# Patient Record
Sex: Female | Born: 1940 | Race: White | Hispanic: No | Smoking: Never smoker
Health system: Southern US, Community
[De-identification: ages and names within clinical notes are randomized; demographics above are authoritative.]

## PROBLEM LIST (undated history)

## (undated) DIAGNOSIS — E079 Disorder of thyroid, unspecified: Secondary | ICD-10-CM

## (undated) DIAGNOSIS — C50912 Malignant neoplasm of unspecified site of left female breast: Secondary | ICD-10-CM

## (undated) DIAGNOSIS — E039 Hypothyroidism, unspecified: Secondary | ICD-10-CM

## (undated) DIAGNOSIS — K219 Gastro-esophageal reflux disease without esophagitis: Secondary | ICD-10-CM

## (undated) DIAGNOSIS — I509 Heart failure, unspecified: Secondary | ICD-10-CM

## (undated) DIAGNOSIS — I82409 Acute embolism and thrombosis of unspecified deep veins of unspecified lower extremity: Secondary | ICD-10-CM

## (undated) DIAGNOSIS — I10 Essential (primary) hypertension: Secondary | ICD-10-CM

## (undated) DIAGNOSIS — H9190 Unspecified hearing loss, unspecified ear: Secondary | ICD-10-CM

## (undated) DIAGNOSIS — Z8719 Personal history of other diseases of the digestive system: Secondary | ICD-10-CM

## (undated) DIAGNOSIS — R42 Dizziness and giddiness: Secondary | ICD-10-CM

## (undated) DIAGNOSIS — R0600 Dyspnea, unspecified: Secondary | ICD-10-CM

## (undated) DIAGNOSIS — I4891 Unspecified atrial fibrillation: Secondary | ICD-10-CM

## (undated) DIAGNOSIS — Z79811 Long term (current) use of aromatase inhibitors: Secondary | ICD-10-CM

## (undated) DIAGNOSIS — K6389 Other specified diseases of intestine: Secondary | ICD-10-CM

## (undated) DIAGNOSIS — C50919 Malignant neoplasm of unspecified site of unspecified female breast: Secondary | ICD-10-CM

## (undated) DIAGNOSIS — Z923 Personal history of irradiation: Secondary | ICD-10-CM

## (undated) DIAGNOSIS — R52 Pain, unspecified: Secondary | ICD-10-CM

## (undated) DIAGNOSIS — N189 Chronic kidney disease, unspecified: Secondary | ICD-10-CM

## (undated) DIAGNOSIS — Z87442 Personal history of urinary calculi: Secondary | ICD-10-CM

## (undated) HISTORY — DX: Unspecified hearing loss, unspecified ear: H91.90

## (undated) HISTORY — PX: CHOLECYSTECTOMY: SHX55

## (undated) HISTORY — DX: Unspecified atrial fibrillation: I48.91

## (undated) HISTORY — PX: ABDOMINAL HYSTERECTOMY: SHX81

## (undated) HISTORY — DX: Other specified diseases of intestine: K63.89

## (undated) HISTORY — DX: Malignant neoplasm of unspecified site of left female breast: C50.912

## (undated) HISTORY — DX: Long term (current) use of aromatase inhibitors: Z79.811

---

## 2004-05-14 ENCOUNTER — Ambulatory Visit: Payer: Self-pay | Admitting: Internal Medicine

## 2005-06-11 ENCOUNTER — Ambulatory Visit: Payer: Self-pay | Admitting: Internal Medicine

## 2005-10-15 ENCOUNTER — Emergency Department: Payer: Self-pay | Admitting: Emergency Medicine

## 2006-06-25 ENCOUNTER — Ambulatory Visit: Payer: Self-pay | Admitting: Internal Medicine

## 2007-06-28 ENCOUNTER — Ambulatory Visit: Payer: Self-pay | Admitting: Internal Medicine

## 2007-07-06 ENCOUNTER — Ambulatory Visit: Payer: Self-pay | Admitting: Internal Medicine

## 2008-04-13 ENCOUNTER — Ambulatory Visit: Payer: Self-pay | Admitting: Internal Medicine

## 2008-07-30 ENCOUNTER — Emergency Department: Payer: Self-pay | Admitting: Emergency Medicine

## 2008-11-03 ENCOUNTER — Ambulatory Visit: Payer: Self-pay | Admitting: Internal Medicine

## 2009-11-14 ENCOUNTER — Ambulatory Visit: Payer: Self-pay

## 2009-11-28 ENCOUNTER — Ambulatory Visit: Payer: Self-pay | Admitting: Internal Medicine

## 2010-06-23 ENCOUNTER — Emergency Department: Payer: Self-pay | Admitting: Emergency Medicine

## 2010-06-28 ENCOUNTER — Ambulatory Visit: Payer: Self-pay | Admitting: Internal Medicine

## 2010-09-24 ENCOUNTER — Ambulatory Visit: Payer: Self-pay | Admitting: Unknown Physician Specialty

## 2010-09-26 LAB — PATHOLOGY REPORT

## 2011-06-03 ENCOUNTER — Ambulatory Visit: Payer: Self-pay | Admitting: Internal Medicine

## 2011-06-17 ENCOUNTER — Ambulatory Visit: Payer: Self-pay | Admitting: Internal Medicine

## 2011-06-17 LAB — CBC CANCER CENTER
Basophil #: 0.1 x10 3/mm (ref 0.0–0.1)
Eosinophil %: 5.1 %
Eosinophil: 6 %
HGB: 9.2 g/dL — ABNORMAL LOW (ref 12.0–16.0)
Lymphocyte %: 16.8 %
Lymphocytes: 15 %
MCH: 20.8 pg — ABNORMAL LOW (ref 26.0–34.0)
MCHC: 30.2 g/dL — ABNORMAL LOW (ref 32.0–36.0)
Monocyte #: 0.7 x10 3/mm (ref 0.2–0.9)
Neutrophil #: 3.7 x10 3/mm (ref 1.4–6.5)
Neutrophil %: 65 %
RBC: 4.45 10*6/uL (ref 3.80–5.20)
RDW: 19.4 % — ABNORMAL HIGH (ref 11.5–14.5)
Segmented Neutrophils: 70 %
WBC: 5.7 x10 3/mm (ref 3.6–11.0)

## 2011-06-17 LAB — IRON AND TIBC
Iron Bind.Cap.(Total): 515 ug/dL — ABNORMAL HIGH (ref 250–450)
Iron: 25 ug/dL — ABNORMAL LOW (ref 50–170)

## 2011-06-17 LAB — FERRITIN: Ferritin (ARMC): 5 ng/mL — ABNORMAL LOW (ref 8–388)

## 2011-06-17 LAB — CREATININE, SERUM
Creatinine: 0.7 mg/dL (ref 0.60–1.30)
EGFR (African American): >60

## 2011-06-17 LAB — RETICULOCYTES: Reticulocyte: 1.34 % (ref 0.5–1.5)

## 2011-06-18 LAB — PROT IMMUNOELECTROPHORES(ARMC)

## 2011-06-18 LAB — URINE IEP, RANDOM

## 2011-06-28 ENCOUNTER — Ambulatory Visit: Payer: Self-pay | Admitting: Internal Medicine

## 2011-07-28 ENCOUNTER — Ambulatory Visit: Payer: Self-pay | Admitting: Internal Medicine

## 2011-09-17 ENCOUNTER — Ambulatory Visit: Payer: Self-pay | Admitting: Internal Medicine

## 2011-09-17 LAB — CBC CANCER CENTER
Basophil %: 1 %
Eosinophil #: 0.4 x10 3/mm (ref 0.0–0.7)
Eosinophil %: 5.2 %
HCT: 39.6 % (ref 35.0–47.0)
HGB: 12.9 g/dL (ref 12.0–16.0)
Lymphocyte #: 1.1 x10 3/mm (ref 1.0–3.6)
MCV: 83 fL (ref 80–100)
Monocyte %: 11 %
Neutrophil #: 4.6 x10 3/mm (ref 1.4–6.5)
RBC: 4.8 10*6/uL (ref 3.80–5.20)
WBC: 6.9 x10 3/mm (ref 3.6–11.0)

## 2011-09-17 LAB — IRON AND TIBC
Iron: 112 ug/dL (ref 50–170)
Unbound Iron-Bind.Cap.: 312 ug/dL

## 2011-09-28 ENCOUNTER — Ambulatory Visit: Payer: Self-pay | Admitting: Internal Medicine

## 2011-12-10 ENCOUNTER — Ambulatory Visit: Payer: Self-pay | Admitting: Internal Medicine

## 2011-12-10 LAB — CANCER CENTER HEMOGLOBIN: HGB: 13 g/dL (ref 12.0–16.0)

## 2011-12-10 LAB — IRON AND TIBC: Iron Bind.Cap.(Total): 460 ug/dL — ABNORMAL HIGH (ref 250–450)

## 2011-12-10 LAB — FERRITIN: Ferritin (ARMC): 12 ng/mL (ref 8–388)

## 2011-12-28 ENCOUNTER — Ambulatory Visit: Payer: Self-pay | Admitting: Internal Medicine

## 2012-01-28 DIAGNOSIS — C50919 Malignant neoplasm of unspecified site of unspecified female breast: Secondary | ICD-10-CM

## 2012-01-28 HISTORY — PX: BREAST LUMPECTOMY WITH SENTINEL LYMPH NODE BIOPSY: SHX5597

## 2012-01-28 HISTORY — DX: Malignant neoplasm of unspecified site of unspecified female breast: C50.919

## 2012-02-12 ENCOUNTER — Emergency Department: Payer: Self-pay | Admitting: Emergency Medicine

## 2012-02-12 LAB — COMPREHENSIVE METABOLIC PANEL
Albumin: 4.2 g/dL (ref 3.4–5.0)
Bilirubin,Total: 0.6 mg/dL (ref 0.2–1.0)
Calcium, Total: 9.7 mg/dL (ref 8.5–10.1)
Chloride: 103 mmol/L (ref 98–107)
Co2: 30 mmol/L (ref 21–32)
Creatinine: 0.69 mg/dL (ref 0.60–1.30)
Glucose: 97 mg/dL (ref 65–99)
Osmolality: 280 (ref 275–301)
SGOT(AST): 27 U/L (ref 15–37)
SGPT (ALT): 24 U/L (ref 12–78)
Total Protein: 7.6 g/dL (ref 6.4–8.2)

## 2012-02-12 LAB — CBC
HCT: 40.6 % (ref 35.0–47.0)
HGB: 13.8 g/dL (ref 12.0–16.0)
MCH: 29.1 pg (ref 26.0–34.0)
Platelet: 190 10*3/uL (ref 150–440)
RDW: 13.9 % (ref 11.5–14.5)

## 2012-03-01 ENCOUNTER — Ambulatory Visit: Payer: Self-pay | Admitting: Unknown Physician Specialty

## 2012-03-02 ENCOUNTER — Ambulatory Visit: Payer: Self-pay | Admitting: Internal Medicine

## 2012-03-03 LAB — PATHOLOGY REPORT

## 2012-03-03 LAB — IRON AND TIBC
Iron Bind.Cap.(Total): 450 ug/dL (ref 250–450)
Iron Saturation: 8 %
Unbound Iron-Bind.Cap.: 412 ug/dL

## 2012-03-03 LAB — CANCER CENTER HEMOGLOBIN: HGB: 13 g/dL (ref 12.0–16.0)

## 2012-03-27 ENCOUNTER — Ambulatory Visit: Payer: Self-pay | Admitting: Internal Medicine

## 2012-04-27 ENCOUNTER — Ambulatory Visit: Payer: Self-pay | Admitting: Internal Medicine

## 2012-05-27 ENCOUNTER — Ambulatory Visit: Payer: Self-pay | Admitting: Internal Medicine

## 2012-06-04 ENCOUNTER — Ambulatory Visit: Payer: Self-pay | Admitting: Internal Medicine

## 2012-06-07 ENCOUNTER — Ambulatory Visit: Payer: Self-pay | Admitting: Internal Medicine

## 2012-06-07 LAB — CBC CANCER CENTER
Basophil #: 0 x10 3/mm (ref 0.0–0.1)
Eosinophil #: 0.4 x10 3/mm (ref 0.0–0.7)
Eosinophil %: 6 %
HGB: 13.8 g/dL (ref 12.0–16.0)
Lymphocyte #: 1 x10 3/mm (ref 1.0–3.6)
Lymphocyte %: 13.7 %
MCH: 29.9 pg (ref 26.0–34.0)
Monocyte #: 0.9 x10 3/mm (ref 0.2–0.9)
Platelet: 165 x10 3/mm (ref 150–440)
RBC: 4.6 10*6/uL (ref 3.80–5.20)
WBC: 7.3 x10 3/mm (ref 3.6–11.0)

## 2012-06-07 LAB — IRON AND TIBC
Iron Bind.Cap.(Total): 332 ug/dL (ref 250–450)
Iron Saturation: 27 %

## 2012-06-07 LAB — FERRITIN: Ferritin (ARMC): 94 ng/mL (ref 8–388)

## 2012-06-14 ENCOUNTER — Ambulatory Visit: Payer: Self-pay | Admitting: Internal Medicine

## 2012-06-27 ENCOUNTER — Ambulatory Visit: Payer: Self-pay | Admitting: Internal Medicine

## 2012-09-17 ENCOUNTER — Ambulatory Visit: Payer: Self-pay | Admitting: Orthopedic Surgery

## 2012-10-04 ENCOUNTER — Ambulatory Visit: Payer: Self-pay | Admitting: Orthopedic Surgery

## 2012-10-04 LAB — URINALYSIS, COMPLETE
Bacteria: NONE SEEN
Blood: NEGATIVE
Ph: 5 (ref 4.5–8.0)
RBC,UR: 1 /HPF (ref 0–5)
Specific Gravity: 1.025 (ref 1.003–1.030)
Squamous Epithelial: 5

## 2012-10-04 LAB — CBC
HCT: 39.5 % (ref 35.0–47.0)
HGB: 13.8 g/dL (ref 12.0–16.0)
MCH: 31.1 pg (ref 26.0–34.0)
MCHC: 34.9 g/dL (ref 32.0–36.0)
WBC: 7.1 10*3/uL (ref 3.6–11.0)

## 2012-10-04 LAB — PROTIME-INR
INR: 1
Prothrombin Time: 13.3 secs (ref 11.5–14.7)

## 2012-10-04 LAB — BASIC METABOLIC PANEL
Calcium, Total: 9.5 mg/dL (ref 8.5–10.1)
Chloride: 104 mmol/L (ref 98–107)
Creatinine: 0.64 mg/dL (ref 0.60–1.30)
Glucose: 93 mg/dL (ref 65–99)
Osmolality: 277 (ref 275–301)

## 2012-10-04 LAB — APTT: Activated PTT: 28.8 secs (ref 23.6–35.9)

## 2012-10-04 LAB — MRSA PCR SCREENING

## 2012-10-08 ENCOUNTER — Ambulatory Visit: Payer: Self-pay | Admitting: Internal Medicine

## 2012-10-08 LAB — CBC CANCER CENTER
Basophil #: 0.1 x10 3/mm (ref 0.0–0.1)
Basophil %: 0.7 %
Eosinophil %: 4.4 %
Lymphocyte #: 0.8 x10 3/mm — ABNORMAL LOW (ref 1.0–3.6)
Lymphocyte %: 10.9 %
MCV: 91 fL (ref 80–100)
Monocyte %: 11.6 %
Platelet: 168 x10 3/mm (ref 150–440)
RDW: 13.9 % (ref 11.5–14.5)

## 2012-10-08 LAB — FERRITIN: Ferritin (ARMC): 63 ng/mL (ref 8–388)

## 2012-10-08 LAB — IRON AND TIBC
Iron Bind.Cap.(Total): 348 ug/dL (ref 250–450)
Iron Saturation: 26 %
Unbound Iron-Bind.Cap.: 256 ug/dL

## 2012-10-27 ENCOUNTER — Ambulatory Visit: Payer: Self-pay | Admitting: Internal Medicine

## 2012-11-10 ENCOUNTER — Ambulatory Visit: Payer: Self-pay | Admitting: Cardiology

## 2012-11-10 LAB — CBC WITH DIFFERENTIAL/PLATELET
Basophil #: 0.1 10*3/uL (ref 0.0–0.1)
Basophil %: 1.2 %
Eosinophil #: 0.3 10*3/uL (ref 0.0–0.7)
Eosinophil %: 4.7 %
HCT: 39.9 % (ref 35.0–47.0)
HGB: 13.8 g/dL (ref 12.0–16.0)
Lymphocyte #: 1 10*3/uL (ref 1.0–3.6)
Lymphocyte %: 14.8 %
MCH: 30.7 pg (ref 26.0–34.0)
MCHC: 34.6 g/dL (ref 32.0–36.0)
MCV: 89 fL (ref 80–100)
Monocyte %: 13.1 %
Platelet: 165 10*3/uL (ref 150–440)
RBC: 4.49 10*6/uL (ref 3.80–5.20)
RDW: 13.5 % (ref 11.5–14.5)
WBC: 6.6 10*3/uL (ref 3.6–11.0)

## 2012-12-16 ENCOUNTER — Ambulatory Visit: Payer: Self-pay | Admitting: Surgery

## 2012-12-29 ENCOUNTER — Ambulatory Visit: Payer: Self-pay | Admitting: Surgery

## 2013-01-06 ENCOUNTER — Ambulatory Visit: Payer: Self-pay | Admitting: Surgery

## 2013-01-06 LAB — PATHOLOGY REPORT

## 2013-01-13 ENCOUNTER — Ambulatory Visit: Payer: Self-pay | Admitting: Surgery

## 2013-01-13 HISTORY — PX: BREAST LUMPECTOMY: SHX2

## 2013-01-13 LAB — POTASSIUM: Potassium: 3.3 mmol/L — ABNORMAL LOW (ref 3.5–5.1)

## 2013-01-17 LAB — PATHOLOGY REPORT

## 2013-01-27 DIAGNOSIS — Z923 Personal history of irradiation: Secondary | ICD-10-CM

## 2013-01-27 HISTORY — DX: Personal history of irradiation: Z92.3

## 2013-01-31 ENCOUNTER — Ambulatory Visit: Payer: Self-pay | Admitting: Radiation Oncology

## 2013-02-07 LAB — IRON AND TIBC
IRON SATURATION: 30 %
IRON: 108 ug/dL (ref 50–170)
Iron Bind.Cap.(Total): 355 ug/dL (ref 250–450)
Unbound Iron-Bind.Cap.: 247 ug/dL

## 2013-02-07 LAB — CBC CANCER CENTER
BASOS ABS: 0 x10 3/mm (ref 0.0–0.1)
BASOS PCT: 0.5 %
EOS ABS: 0.3 x10 3/mm (ref 0.0–0.7)
Eosinophil %: 5.3 %
HCT: 41.5 % (ref 35.0–47.0)
HGB: 13.5 g/dL (ref 12.0–16.0)
LYMPHS ABS: 0.8 x10 3/mm — AB (ref 1.0–3.6)
Lymphocyte %: 12.7 %
MCH: 28.9 pg (ref 26.0–34.0)
MCHC: 32.4 g/dL (ref 32.0–36.0)
MCV: 89 fL (ref 80–100)
Monocyte #: 0.8 x10 3/mm (ref 0.2–0.9)
Monocyte %: 12.6 %
NEUTROS PCT: 68.9 %
Neutrophil #: 4.5 x10 3/mm (ref 1.4–6.5)
PLATELETS: 180 x10 3/mm (ref 150–440)
RBC: 4.65 10*6/uL (ref 3.80–5.20)
RDW: 14.1 % (ref 11.5–14.5)
WBC: 6.6 x10 3/mm (ref 3.6–11.0)

## 2013-02-07 LAB — FERRITIN: FERRITIN (ARMC): 52 ng/mL (ref 8–388)

## 2013-02-27 ENCOUNTER — Ambulatory Visit: Payer: Self-pay | Admitting: Radiation Oncology

## 2013-03-03 LAB — CBC CANCER CENTER
Basophil #: 0.1 x10 3/mm (ref 0.0–0.1)
Basophil %: 0.7 %
EOS ABS: 0.4 x10 3/mm (ref 0.0–0.7)
EOS PCT: 5.3 %
HCT: 43 % (ref 35.0–47.0)
HGB: 14.1 g/dL (ref 12.0–16.0)
Lymphocyte #: 0.8 x10 3/mm — ABNORMAL LOW (ref 1.0–3.6)
Lymphocyte %: 11 %
MCH: 29.4 pg (ref 26.0–34.0)
MCHC: 32.8 g/dL (ref 32.0–36.0)
MCV: 90 fL (ref 80–100)
Monocyte #: 1 x10 3/mm — ABNORMAL HIGH (ref 0.2–0.9)
Monocyte %: 13.8 %
NEUTROS PCT: 69.2 %
Neutrophil #: 5.3 x10 3/mm (ref 1.4–6.5)
Platelet: 183 x10 3/mm (ref 150–440)
RBC: 4.8 10*6/uL (ref 3.80–5.20)
RDW: 14.2 % (ref 11.5–14.5)
WBC: 7.6 x10 3/mm (ref 3.6–11.0)

## 2013-03-10 LAB — CBC CANCER CENTER
Basophil #: 0 x10 3/mm (ref 0.0–0.1)
Basophil %: 0.5 %
Eosinophil #: 0.3 x10 3/mm (ref 0.0–0.7)
Eosinophil %: 4.7 %
HCT: 41.8 % (ref 35.0–47.0)
HGB: 13.8 g/dL (ref 12.0–16.0)
LYMPHS PCT: 8.2 %
Lymphocyte #: 0.6 x10 3/mm — ABNORMAL LOW (ref 1.0–3.6)
MCH: 29.3 pg (ref 26.0–34.0)
MCHC: 32.9 g/dL (ref 32.0–36.0)
MCV: 89 fL (ref 80–100)
Monocyte #: 1 x10 3/mm — ABNORMAL HIGH (ref 0.2–0.9)
Monocyte %: 13.7 %
NEUTROS ABS: 5.4 x10 3/mm (ref 1.4–6.5)
Neutrophil %: 72.9 %
Platelet: 175 x10 3/mm (ref 150–440)
RBC: 4.69 10*6/uL (ref 3.80–5.20)
RDW: 14.2 % (ref 11.5–14.5)
WBC: 7.4 x10 3/mm (ref 3.6–11.0)

## 2013-03-27 ENCOUNTER — Ambulatory Visit: Payer: Self-pay | Admitting: Radiation Oncology

## 2013-03-31 LAB — CBC CANCER CENTER
Basophil #: 0.1 x10 3/mm (ref 0.0–0.1)
Basophil %: 1 %
EOS PCT: 5.2 %
Eosinophil #: 0.4 x10 3/mm (ref 0.0–0.7)
HCT: 39.1 % (ref 35.0–47.0)
HGB: 12.7 g/dL (ref 12.0–16.0)
LYMPHS ABS: 0.5 x10 3/mm — AB (ref 1.0–3.6)
LYMPHS PCT: 7.2 %
MCH: 29.4 pg (ref 26.0–34.0)
MCHC: 32.6 g/dL (ref 32.0–36.0)
MCV: 90 fL (ref 80–100)
MONO ABS: 0.8 x10 3/mm (ref 0.2–0.9)
Monocyte %: 12.1 %
Neutrophil #: 5.2 x10 3/mm (ref 1.4–6.5)
Neutrophil %: 74.5 %
PLATELETS: 174 x10 3/mm (ref 150–440)
RBC: 4.33 10*6/uL (ref 3.80–5.20)
RDW: 14 % (ref 11.5–14.5)
WBC: 6.9 x10 3/mm (ref 3.6–11.0)

## 2013-04-07 LAB — CBC CANCER CENTER
BASOS ABS: 0.1 x10 3/mm (ref 0.0–0.1)
Basophil %: 0.9 %
EOS PCT: 5.1 %
Eosinophil #: 0.4 x10 3/mm (ref 0.0–0.7)
HCT: 39.4 % (ref 35.0–47.0)
HGB: 12.8 g/dL (ref 12.0–16.0)
LYMPHS ABS: 0.4 x10 3/mm — AB (ref 1.0–3.6)
Lymphocyte %: 6.1 %
MCH: 29.2 pg (ref 26.0–34.0)
MCHC: 32.6 g/dL (ref 32.0–36.0)
MCV: 90 fL (ref 80–100)
MONOS PCT: 11.6 %
Monocyte #: 0.8 x10 3/mm (ref 0.2–0.9)
Neutrophil #: 5.5 x10 3/mm (ref 1.4–6.5)
Neutrophil %: 76.3 %
Platelet: 173 x10 3/mm (ref 150–440)
RBC: 4.39 10*6/uL (ref 3.80–5.20)
RDW: 14.2 % (ref 11.5–14.5)
WBC: 7.2 x10 3/mm (ref 3.6–11.0)

## 2013-04-21 LAB — CBC CANCER CENTER
BASOS ABS: 0 x10 3/mm (ref 0.0–0.1)
Basophil %: 0.5 %
EOS PCT: 5.3 %
Eosinophil #: 0.3 x10 3/mm (ref 0.0–0.7)
HCT: 39.7 % (ref 35.0–47.0)
HGB: 12.8 g/dL (ref 12.0–16.0)
Lymphocyte #: 0.4 x10 3/mm — ABNORMAL LOW (ref 1.0–3.6)
Lymphocyte %: 6.4 %
MCH: 29 pg (ref 26.0–34.0)
MCHC: 32.3 g/dL (ref 32.0–36.0)
MCV: 90 fL (ref 80–100)
MONOS PCT: 14.6 %
Monocyte #: 0.8 x10 3/mm (ref 0.2–0.9)
NEUTROS ABS: 4.2 x10 3/mm (ref 1.4–6.5)
Neutrophil %: 73.2 %
Platelet: 168 x10 3/mm (ref 150–440)
RBC: 4.43 10*6/uL (ref 3.80–5.20)
RDW: 13.8 % (ref 11.5–14.5)
WBC: 5.7 x10 3/mm (ref 3.6–11.0)

## 2013-04-26 LAB — HEPATIC FUNCTION PANEL A (ARMC)
ALBUMIN: 3.7 g/dL (ref 3.4–5.0)
ALT: 20 U/L (ref 12–78)
AST: 22 U/L (ref 15–37)
Alkaline Phosphatase: 80 U/L
Bilirubin, Direct: 0.1 mg/dL (ref 0.00–0.20)
Bilirubin,Total: 0.7 mg/dL (ref 0.2–1.0)
Total Protein: 6.8 g/dL (ref 6.4–8.2)

## 2013-04-26 LAB — CBC CANCER CENTER
Basophil #: 0 x10 3/mm (ref 0.0–0.1)
Basophil %: 0.8 %
Eosinophil #: 0.3 x10 3/mm (ref 0.0–0.7)
Eosinophil %: 4.6 %
HCT: 39.7 % (ref 35.0–47.0)
HGB: 12.7 g/dL (ref 12.0–16.0)
Lymphocyte #: 0.3 x10 3/mm — ABNORMAL LOW (ref 1.0–3.6)
Lymphocyte %: 5.7 %
MCH: 28.8 pg (ref 26.0–34.0)
MCHC: 32 g/dL (ref 32.0–36.0)
MCV: 90 fL (ref 80–100)
MONO ABS: 0.8 x10 3/mm (ref 0.2–0.9)
MONOS PCT: 13.9 %
Neutrophil #: 4.2 x10 3/mm (ref 1.4–6.5)
Neutrophil %: 75 %
PLATELETS: 164 x10 3/mm (ref 150–440)
RBC: 4.4 10*6/uL (ref 3.80–5.20)
RDW: 14 % (ref 11.5–14.5)
WBC: 5.6 x10 3/mm (ref 3.6–11.0)

## 2013-04-26 LAB — IRON AND TIBC
IRON BIND. CAP.(TOTAL): 408 ug/dL (ref 250–450)
Iron Saturation: 22 %
Iron: 90 ug/dL (ref 50–170)
UNBOUND IRON-BIND. CAP.: 318 ug/dL

## 2013-04-26 LAB — FERRITIN: Ferritin (ARMC): 12 ng/mL (ref 8–388)

## 2013-04-26 LAB — CREATININE, SERUM: Creatinine: 0.75 mg/dL (ref 0.60–1.30)

## 2013-04-27 ENCOUNTER — Ambulatory Visit: Payer: Self-pay | Admitting: Radiation Oncology

## 2013-05-27 ENCOUNTER — Ambulatory Visit: Payer: Self-pay | Admitting: Radiation Oncology

## 2013-06-08 ENCOUNTER — Ambulatory Visit: Payer: Self-pay | Admitting: Surgery

## 2013-07-12 ENCOUNTER — Emergency Department: Payer: Self-pay | Admitting: Emergency Medicine

## 2013-07-12 LAB — COMPREHENSIVE METABOLIC PANEL
ALT: 18 U/L (ref 12–78)
ANION GAP: 10 (ref 7–16)
Albumin: 3.7 g/dL (ref 3.4–5.0)
Alkaline Phosphatase: 100 U/L
BUN: 18 mg/dL (ref 7–18)
Bilirubin,Total: 1 mg/dL (ref 0.2–1.0)
CALCIUM: 9.2 mg/dL (ref 8.5–10.1)
Chloride: 107 mmol/L (ref 98–107)
Co2: 20 mmol/L — ABNORMAL LOW (ref 21–32)
Creatinine: 0.75 mg/dL (ref 0.60–1.30)
EGFR (African American): >60
Glucose: 122 mg/dL — ABNORMAL HIGH (ref 65–99)
Osmolality: 277 (ref 275–301)
POTASSIUM: 4.7 mmol/L (ref 3.5–5.1)
SGOT(AST): 31 U/L (ref 15–37)
Sodium: 137 mmol/L (ref 136–145)
TOTAL PROTEIN: 6.9 g/dL (ref 6.4–8.2)

## 2013-07-12 LAB — URINALYSIS, COMPLETE
Bilirubin,UR: NEGATIVE
Glucose,UR: NEGATIVE mg/dL (ref 0–75)
KETONE: NEGATIVE
Leukocyte Esterase: NEGATIVE
Nitrite: NEGATIVE
PROTEIN: NEGATIVE
Ph: 6 (ref 4.5–8.0)
Specific Gravity: 1.015 (ref 1.003–1.030)
Squamous Epithelial: <1
WBC UR: 4 /HPF (ref 0–5)

## 2013-07-12 LAB — LIPASE, BLOOD: Lipase: 158 U/L (ref 73–393)

## 2013-07-12 LAB — PROTIME-INR
INR: 0.9
Prothrombin Time: 11.9 secs (ref 11.5–14.7)

## 2013-07-12 LAB — CBC
HCT: 32.8 % — ABNORMAL LOW (ref 35.0–47.0)
HGB: 10.5 g/dL — AB (ref 12.0–16.0)
MCH: 26.3 pg (ref 26.0–34.0)
MCHC: 32.1 g/dL (ref 32.0–36.0)
MCV: 82 fL (ref 80–100)
PLATELETS: 185 10*3/uL (ref 150–440)
RBC: 4 10*6/uL (ref 3.80–5.20)
RDW: 14.8 % — ABNORMAL HIGH (ref 11.5–14.5)
WBC: 12.5 10*3/uL — ABNORMAL HIGH (ref 3.6–11.0)

## 2013-08-01 ENCOUNTER — Ambulatory Visit: Payer: Self-pay | Admitting: Surgery

## 2013-08-02 ENCOUNTER — Ambulatory Visit: Payer: Self-pay | Admitting: Internal Medicine

## 2013-08-02 LAB — HEPATIC FUNCTION PANEL A (ARMC)
ALK PHOS: 94 U/L
Albumin: 3.7 g/dL (ref 3.4–5.0)
BILIRUBIN TOTAL: 0.5 mg/dL (ref 0.2–1.0)
Bilirubin, Direct: 0.1 mg/dL (ref 0.00–0.20)
SGOT(AST): 18 U/L (ref 15–37)
SGPT (ALT): 16 U/L (ref 12–78)
Total Protein: 6.8 g/dL (ref 6.4–8.2)

## 2013-08-02 LAB — CBC CANCER CENTER
BASOS ABS: 0 x10 3/mm (ref 0.0–0.1)
BASOS PCT: 0.7 %
EOS ABS: 0.2 x10 3/mm (ref 0.0–0.7)
EOS PCT: 4.3 %
HCT: 30.5 % — ABNORMAL LOW (ref 35.0–47.0)
HGB: 9.4 g/dL — ABNORMAL LOW (ref 12.0–16.0)
Lymphocyte #: 0.6 x10 3/mm — ABNORMAL LOW (ref 1.0–3.6)
Lymphocyte %: 11.2 %
MCH: 24.4 pg — AB (ref 26.0–34.0)
MCHC: 30.9 g/dL — ABNORMAL LOW (ref 32.0–36.0)
MCV: 79 fL — ABNORMAL LOW (ref 80–100)
Monocyte #: 0.7 x10 3/mm (ref 0.2–0.9)
Monocyte %: 13 %
Neutrophil #: 3.9 x10 3/mm (ref 1.4–6.5)
Neutrophil %: 70.8 %
PLATELETS: 194 x10 3/mm (ref 150–440)
RBC: 3.87 10*6/uL (ref 3.80–5.20)
RDW: 15.7 % — AB (ref 11.5–14.5)
WBC: 5.5 x10 3/mm (ref 3.6–11.0)

## 2013-08-02 LAB — CREATININE, SERUM
Creatinine: 0.76 mg/dL (ref 0.60–1.30)
EGFR (African American): >60
EGFR (Non-African Amer.): >60

## 2013-08-02 LAB — PROTIME-INR
INR: 1
Prothrombin Time: 13.5 secs (ref 11.5–14.7)

## 2013-08-02 LAB — APTT: ACTIVATED PTT: 28.5 s (ref 23.6–35.9)

## 2013-08-03 LAB — CEA: CEA: 1.3 ng/mL (ref 0.0–4.7)

## 2013-08-03 LAB — CANCER ANTIGEN 19-9: CA 19-9: 7 U/mL (ref 0–35)

## 2013-08-03 LAB — CA 125: CA 125: 12.7 U/mL (ref 0.0–34.0)

## 2013-08-03 LAB — CANCER ANTIGEN 27.29: CA 27.29: 11.3 U/mL (ref 0.0–38.6)

## 2013-08-08 ENCOUNTER — Ambulatory Visit: Payer: Self-pay | Admitting: Internal Medicine

## 2013-08-27 ENCOUNTER — Ambulatory Visit: Payer: Self-pay | Admitting: Internal Medicine

## 2013-11-17 ENCOUNTER — Ambulatory Visit: Payer: Self-pay | Admitting: Internal Medicine

## 2013-11-27 ENCOUNTER — Ambulatory Visit: Payer: Self-pay | Admitting: Internal Medicine

## 2014-01-31 ENCOUNTER — Ambulatory Visit: Payer: Self-pay | Admitting: Internal Medicine

## 2014-01-31 LAB — CBC CANCER CENTER
BASOS PCT: 1.6 %
Basophil #: 0.1 x10 3/mm (ref 0.0–0.1)
EOS ABS: 0.2 x10 3/mm (ref 0.0–0.7)
Eosinophil %: 4.9 %
HCT: 23.9 % — ABNORMAL LOW (ref 35.0–47.0)
HGB: 7.1 g/dL — AB (ref 12.0–16.0)
LYMPHS PCT: 15.5 %
Lymphocyte #: 0.7 x10 3/mm — ABNORMAL LOW (ref 1.0–3.6)
MCH: 18.4 pg — ABNORMAL LOW (ref 26.0–34.0)
MCHC: 29.7 g/dL — AB (ref 32.0–36.0)
MCV: 62 fL — ABNORMAL LOW (ref 80–100)
MONO ABS: 0.7 x10 3/mm (ref 0.2–0.9)
Monocyte %: 15.4 %
NEUTROS ABS: 3 x10 3/mm (ref 1.4–6.5)
NEUTROS PCT: 62.6 %
Platelet: 233 x10 3/mm (ref 150–440)
RBC: 3.87 10*6/uL (ref 3.80–5.20)
RDW: 19.6 % — AB (ref 11.5–14.5)
WBC: 4.7 x10 3/mm (ref 3.6–11.0)

## 2014-01-31 LAB — CREATININE, SERUM
CREATININE: 0.77 mg/dL (ref 0.60–1.30)
EGFR (African American): >60
EGFR (Non-African Amer.): >60

## 2014-01-31 LAB — IRON AND TIBC
IRON SATURATION: 2 %
Iron Bind.Cap.(Total): 541 ug/dL — ABNORMAL HIGH (ref 250–450)
Iron: 13 ug/dL — ABNORMAL LOW (ref 50–170)
Unbound Iron-Bind.Cap.: 528 ug/dL

## 2014-01-31 LAB — HEPATIC FUNCTION PANEL A (ARMC)
AST: 15 U/L (ref 15–37)
Albumin: 3.7 g/dL (ref 3.4–5.0)
Alkaline Phosphatase: 105 U/L
BILIRUBIN DIRECT: 0.2 mg/dL (ref 0.0–0.2)
Bilirubin,Total: 0.7 mg/dL (ref 0.2–1.0)
SGPT (ALT): 16 U/L
Total Protein: 6.7 g/dL (ref 6.4–8.2)

## 2014-01-31 LAB — FERRITIN: Ferritin (ARMC): 3 ng/mL — ABNORMAL LOW (ref 8–388)

## 2014-02-22 ENCOUNTER — Ambulatory Visit: Payer: Self-pay | Admitting: Unknown Physician Specialty

## 2014-02-27 ENCOUNTER — Ambulatory Visit: Payer: Self-pay | Admitting: Internal Medicine

## 2014-02-27 LAB — CANCER CENTER HEMOGLOBIN: HGB: 9 g/dL — AB (ref 12.0–16.0)

## 2014-03-06 LAB — CANCER CENTER HEMOGLOBIN: HGB: 10 g/dL — AB (ref 12.0–16.0)

## 2014-03-28 ENCOUNTER — Ambulatory Visit
Admit: 2014-03-28 | Disposition: A | Discharge status: Home / Self Care | Payer: Self-pay | Attending: Internal Medicine | Admitting: Internal Medicine

## 2014-04-24 LAB — FERRITIN: Ferritin (ARMC): 9 ng/mL — ABNORMAL LOW

## 2014-04-24 LAB — IRON AND TIBC
IRON BIND. CAP.(TOTAL): 495 — AB (ref 250–450)
Iron Saturation: 6.5
Iron: 32 ug/dL
Unbound Iron-Bind.Cap.: 462.8

## 2014-04-24 LAB — CANCER CENTER HEMOGLOBIN: HGB: 11 g/dL — ABNORMAL LOW (ref 12.0–16.0)

## 2014-04-27 ENCOUNTER — Other Ambulatory Visit
Admit: 2014-04-27 | Disposition: A | Discharge status: Home / Self Care | Payer: Self-pay | Attending: Unknown Physician Specialty | Admitting: Unknown Physician Specialty

## 2014-04-27 LAB — CBC WITH DIFFERENTIAL/PLATELET
BASOS ABS: 0.1 10*3/uL (ref 0.0–0.1)
Basophil %: 0.8 %
Eosinophil #: 0.3 10*3/uL (ref 0.0–0.7)
Eosinophil %: 3.9 %
HCT: 35.2 % (ref 35.0–47.0)
HGB: 11.2 g/dL — ABNORMAL LOW (ref 12.0–16.0)
LYMPHS PCT: 9.1 %
Lymphocyte #: 0.6 10*3/uL — ABNORMAL LOW (ref 1.0–3.6)
MCH: 23.9 pg — ABNORMAL LOW (ref 26.0–34.0)
MCHC: 31.7 g/dL — AB (ref 32.0–36.0)
MCV: 75 fL — ABNORMAL LOW (ref 80–100)
MONOS PCT: 10.5 %
Monocyte #: 0.7 x10 3/mm (ref 0.2–0.9)
NEUTROS PCT: 75.7 %
Neutrophil #: 4.9 10*3/uL (ref 1.4–6.5)
Platelet: 180 10*3/uL (ref 150–440)
RBC: 4.68 10*6/uL (ref 3.80–5.20)
RDW: 26.3 % — ABNORMAL HIGH (ref 11.5–14.5)
WBC: 6.5 10*3/uL (ref 3.6–11.0)

## 2014-04-28 ENCOUNTER — Ambulatory Visit
Admit: 2014-04-28 | Disposition: A | Discharge status: Home / Self Care | Payer: Self-pay | Attending: Internal Medicine | Admitting: Internal Medicine

## 2014-05-04 ENCOUNTER — Ambulatory Visit
Admit: 2014-05-04 | Disposition: A | Discharge status: Home / Self Care | Payer: Self-pay | Attending: Unknown Physician Specialty | Admitting: Unknown Physician Specialty

## 2014-05-19 NOTE — Op Note (Signed)
PATIENT NAME:  Emma [name], Emma [name] MR#:  [id number] DATE OF BIRTH:  1940/06/03  DATE OF PROCEDURE:  01/13/2013  PREOPERATIVE DIAGNOSIS: Carcinoma of the left breast.   POSTOPERATIVE DIAGNOSIS: Carcinoma of the left breast.   PROCEDURE: Left partial mastectomy with sentinel lymph node biopsy.   SURGEON: Rochel Brome, M.D.   ANESTHESIA: General.   INDICATIONS: This 74 year old female recently had findings of a small mass in the upper central aspect of the left breast. She had ultrasound-guided core biopsy which demonstrated infiltrating carcinoma. She had preop x-ray needle localization and injection of radioactive technetium sulfur colloid.   DESCRIPTION OF PROCEDURE: The patient was placed on the operating table in the supine position under general anesthesia. The left arm was placed on a lateral arm support. Her mammogram images were reviewed prior to surgery. The dressing was removed from the left breast exposing the Kopans wire, which entered the breast at the 3 o'clock position and the wire was cut 2 cm from the skin. The breast was prepared with ChloraPrep, draped in a sterile manner.   The gamma counter was used to probe the axilla demonstrating radioactivity in the inferior aspect of the axilla. An obliquely oriented 4 cm incision was made, carried down through subcutaneous tissues. Two traversing veins were divided between 4-0 chromic suture ligatures and divided. Dissection was carried down through superficial fascia deeply within the breast adjacent to the rib cage finding radioactivity and there was a lymph node encountered which was removed with some surrounding fatty tissue. The ex vivo count was in the range of 350 to 400 counts per second. The background count was less than 16 counts per second. There was no remaining palpable mass within the axilla. It is noted that electrocautery was used during the course of a dissection and then following this hemostasis was intact. The lymph node was  sent for routine pathology.   Next, attention was turned to the left partial mastectomy. A curvilinear incision was made in the upper aspect of the left breast just about 2 cm outside the border of the areola from approximately 10 o'clock to 2 o'clock position carried down deeply within the breast and encountered the wire. There was a point in the wire, which was some 9 cm from the skin and from this point laterally a mass of tissue was removed. This mass was approximately 3 x 3 x 5 cm in dimension and was removed with the wire. It was labeled with the margin maps to mark the size closest to the skin, also to mark the medial, lateral, cranial, caudal and deep margins and was submitted for specimen mammogram and pathology to check for margins. The wound was inspected. It is noted that several small bleeding points were cauterized during the course of the procedure. Hemostasis subsequently appeared to be intact.   Attention was turned back to the axillary wound. Hemostasis was intact. The wound was closed with running 4-0 Monocryl subcuticular suture.  Next, attention was turned back to the partial mastectomy wound, which is further inspected and finding hemostasis was intact. Deeper tissues surrounding cautery artifact were infiltrated with 0.5% Sensorcaine with epinephrine and the subcutaneous tissues were infiltrated as well. The subcutaneous tissues were approximated with 4-0 chromic. The skin was closed with running 4-0 Monocryl subcuticular suture.   The radiologist called to report that the biopsy marker was seen in satisfactory position within the specimen. It is also noted that the pathologist later called back to indicate that margins appeared good.  Both incisions were treated with Dermabond and allowed to dry. Subsequently the patient was awoken and prepared for transfer to the recovery room.   ____________________________ Lenna Sciara. Rochel Brome, MD jws:ce D: 01/13/2013 14:49:56 ET T: 01/13/2013  15:47:31 ET JOB#: [id number]  cc: Loreli Dollar, MD, <Dictator> Loreli Dollar MD ELECTRONICALLY SIGNED 01/19/2013 17:53

## 2014-05-20 NOTE — Consult Note (Signed)
Reason for Visit: This 74 year old Female patient presents to the clinic for initial evaluation of  breast cancer .   Referred by Dr. Ma Hillock.  Diagnosis:  Chief Complaint/Diagnosis   74 year old female with stage I (T1 A. 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  Pathology Report pathology report reviewed   Imaging Report mammograms ultrasound reviewed   Referral Report clinical notes reviewed   Planned Treatment Regimen whole breast radiation plus aromatase inhibitor   HPI   patient is a pleasant hard of hearing 74 year old femalepatient of Dr. Ma Hillock being followed for iron deficiency anemia. Recently found to have an abnormal mammogram showing increase in retroalveolarcircumcised lesion showed a hypoechoic mass at 2:30 position. Mass measuring approximately 4 mm in greatest dimension.patient underwent wide local excision for a 5 mm grade 1 infiltrating mammary carcinoma with margins clear at 2 mm. 2 sentinel lymph nodes were negative. Tumor was ER/PR positive HER-2/neu not overexpressed. Margins were clear at 2 mm. Patient has done well postoperatively. She has been seen by medical oncology and based on the small size of the lesion have deferred systemic chemotherapy. She will be a candidate for aromatase inhibitor after completion of radiation. She seen today for radiation oncology opinion. She specifically denies breast tenderness cough or bone pain. Patient does have known iron deficiency anemia she is hard of hearing and has chronic fatigue on exertion.  Past Hx:    Hyperglycemia:    GERD - Esophageal Reflux:    Anemia:    Arthritis:    Hypothyroidism:    Hard of Hearing:    HTN:    Gall Bladder:    Salpingectomy - right:    Oopherectomy right:    Past, Family and Social History:  Past Medical History positive   Cardiovascular hypertension   Gastrointestinal GERD   Endocrine hypothyroidism   Neurological/Psychiatric hard of hearing   Past Surgical History cholecystectomy; right salpingo-oophorectomy   Past Medical History Comments iron deficiency anemia, hyperglycemia, arthritis   Family History positive   Family History Comments family history positive for hypertension anemia and mother with breast cancer   Social History noncontributory   Additional Past Medical and Surgical History seen by herself today   Allergies:   Sulfa: Unknown  Home Meds:  Home Medications: Medication Instructions Status  labetalol 300 mg oral tablet 1 tab(s) orally 2 times a day Active  Synthroid 88 mcg (0.088 mg) oral tablet 1 tab(s) orally once a day Active  Vitamin D3 1000 intl units oral tablet 1 tab(s) orally once a day Active  hydrochlorothiazide-losartan 25 mg-100 mg oral tablet 1 tab(s) orally once a day (at bedtime) Active  esomeprazole 40 mg oral delayed release capsule 1 tab(s) orally once a day (in the morning) Active   Review of Systems:  General negative   Performance Status (ECOG) 0   Skin negative   Breast see HPI   Ophthalmologic negative   ENMT negative   Respiratory and Thorax negative   Cardiovascular negative   Gastrointestinal negative   Genitourinary negative   Musculoskeletal negative   Neurological negative   Psychiatric negative   Hematology/Lymphatics negative   Endocrine negative   Allergic/Immunologic negative   Review of Systems   review of systems obtained from nurses notes  Nursing Notes:  Nursing Vital Signs and Chemo  Nursing Nursing Notes: *CC Vital Signs Flowsheet:   12-Jan-15 10:48  Temp Temperature 96.5  Pulse Pulse 85  Respirations Respirations 20  SBP SBP 152  DBP DBP 81  Pain Scale (0-10)  0  Current Weight (kg) (kg) 93  Height (cm) centimeters 179  BSA  (m2) 2.1   Physical Exam:  General/Skin/HEENT:  General normal   Skin normal   Eyes normal   ENMT normal   Head and Neck normal   Additional PE well-developed obese female in NAD hard of hearing. She status post wide local excision the left breast which is healing well. Both breasts are large and pendulous. No axillary or subclavicular adenopathy is identified. No dominant mass or nodularity is noted in either breast into position examined. Lungs are clear to A&P cardiac examination shows regular rate and rhythm.   Breasts/Resp/CV/GI/GU:  Respiratory and Thorax normal   Cardiovascular normal   Gastrointestinal normal   Genitourinary normal   MS/Neuro/Psych/Lymph:  Musculoskeletal normal   Neurological normal   Lymphatics normal   Other Results:  Radiology Results: LabUnknown:    20-Nov-14 13:35, Digital Unilateral Left Breast  PACS [place]:  Digital Unilateral Left Breast   REASON FOR EXAM:    LT BRST MASS FU  COMMENTS:       PROCEDURE: MAM - MAM DGTL UNI MAM LT BREAST W/CAD  - Dec 16 2012  1:35PM     CLINICAL DATA:  Six-month followup of left breast mass.    EXAM:  DIGITAL DIAGNOSTIC  LEFT MAMMOGRAM WITH CAD    ULTRASOUND LEFT BREAST    COMPARISON:  Multiple priors    ACR Breast Density Category b: There are scattered areas of  fibroglandular density.    FINDINGS:  Interval increase in size of retroareolar circumscribed oval left  breast mass, measuring up to 7 mm compared with 4 mm previously.    Mammographic images were processed with CAD.    On physical exam in the area of mammographic concern, no palpable  abnormality is identified.    Targeted ultrasound demonstrates a hypoechoic oval mass at 2:30 in  the subareolar left breast, measuring 4 x 3 x 4 mm. This corresponds  with the mammographic abnormality. The previously seen questioned  cyst is not identified. Left axillary ultrasound demonstrates no  lymphadenopathy.      IMPRESSION:  Interval increase in size the retroareolar left breast mass. Due to  the interval enlargement, an ultrasound-guided biopsy is  recommended.    RECOMMENDATION:  Ultrasound-guided core needle biopsy of the left breast mass.    I have discussed the findings and recommendations with the patient.  Results were also provided in writing at the conclusion of the  visit.    BI-RADS CATEGORY  4: Suspicious abnormality - biopsy should be  considered.      Electronically Signed    By: Donavan Burnet M.D.    On: 11/20/201417:23         Verified By: Maryella Shivers, M.D.,   Relevent Results:   Relevant Scans and Labs mammograms and ultrasound were reviewed   Assessment and Plan: Impression:   a stage I ER/PR positive HER-2/neu negative invasive mammary carcinoma status post wide local excision in 74 year old female who did notI received adjuvant chemotherapy Plan:   at this time I have recommended whole breast radiation based on the large size of her breast. We'll try to do some maneuvering with a brought to keep her breast from encroaching too  much on her abdominal space. Would plan on delivering 5000 cGy over 5 weeks boosting or scar another 1600 cGy using electron. Risks and benefits of treatment were reviewed with the patient. Based on the large size of her breast more significant skin reaction may be in the future. Also inclusion of some superficial lung and possible heart were all explained in detail to the patient. She seems to comprehend her treatment plan well. I have set her up for CT simulation early next week. Patient will also be a candidate for aromatase inhibitor by medical oncology after completion of radiation. I would like to take this opportunity to thank you for allowing me to continue to participate in this patient's care.  CC Referral:  cc: Dr. Tamala Julian, Dr. Frazier Richards   Electronic Signatures: Baruch Gouty, Roda Shutters (MD)  (Signed 20-Jan-15  15:08)  Authored: HPI, Diagnosis, Past Hx, PFSH, Allergies, Home Meds, ROS, Nursing Notes, Physical Exam, Other Results, Relevent Results, Encounter Assessment and Plan, CC Referring Physician   Last Updated: 20-Jan-15 15:08 by Armstead Peaks (MD)

## 2014-05-22 ENCOUNTER — Ambulatory Visit
Admit: 2014-05-22 | Disposition: A | Discharge status: Home / Self Care | Payer: Self-pay | Attending: Internal Medicine | Admitting: Internal Medicine

## 2014-05-22 LAB — CBC CANCER CENTER
BASOS PCT: 1 %
Basophil #: 0.1 x10 3/mm (ref 0.0–0.1)
EOS PCT: 4.8 %
Eosinophil #: 0.3 x10 3/mm (ref 0.0–0.7)
HCT: 36.4 % (ref 35.0–47.0)
HGB: 11.7 g/dL — AB (ref 12.0–16.0)
Lymphocyte #: 0.6 x10 3/mm — ABNORMAL LOW (ref 1.0–3.6)
Lymphocyte %: 10.6 %
MCH: 25.2 pg — AB (ref 26.0–34.0)
MCHC: 32.3 g/dL (ref 32.0–36.0)
MCV: 78 fL — AB (ref 80–100)
MONO ABS: 0.6 x10 3/mm (ref 0.2–0.9)
Monocyte %: 11 %
NEUTROS ABS: 4.1 x10 3/mm (ref 1.4–6.5)
Neutrophil %: 72.6 %
Platelet: 173 x10 3/mm (ref 150–440)
RBC: 4.66 10*6/uL (ref 3.80–5.20)
RDW: 23.6 % — ABNORMAL HIGH (ref 11.5–14.5)
WBC: 5.6 x10 3/mm (ref 3.6–11.0)

## 2014-05-22 LAB — CREATININE, SERUM: Creatinine: 0.64 mg/dL

## 2014-05-22 LAB — HEPATIC FUNCTION PANEL A (ARMC)
ALBUMIN: 4.2 g/dL
ALT: 17 U/L
AST: 29 U/L
Alkaline Phosphatase: 95 U/L
Bilirubin, Direct: <0.1 mg/dL
Bilirubin,Total: 0.8 mg/dL
Total Protein: 7 g/dL

## 2014-05-22 LAB — SURGICAL PATHOLOGY

## 2014-05-31 ENCOUNTER — Other Ambulatory Visit: Payer: Self-pay | Admitting: Surgery

## 2014-05-31 DIAGNOSIS — Z853 Personal history of malignant neoplasm of breast: Secondary | ICD-10-CM

## 2014-06-02 ENCOUNTER — Other Ambulatory Visit: Payer: Self-pay | Admitting: *Deleted

## 2014-06-02 ENCOUNTER — Telehealth: Payer: Self-pay | Admitting: *Deleted

## 2014-06-02 DIAGNOSIS — C50912 Malignant neoplasm of unspecified site of left female breast: Secondary | ICD-10-CM

## 2014-06-02 NOTE — Telephone Encounter (Signed)
Pt called and req. Mammogram to be sch for her yearly one  Entered order in epic to have mammogram sch. Anytime after 06/08/13 because that is the date that pt had last one and asked schedulers to call and let pt/ daughter know the date as well as mailing appt to pt.

## 2014-06-14 ENCOUNTER — Ambulatory Visit: Payer: Medicare Other

## 2014-06-14 ENCOUNTER — Ambulatory Visit
Admission: RE | Admit: 2014-06-14 | Discharge: 2014-06-14 | Disposition: A | Discharge status: Home / Self Care | Payer: Medicare Other | Source: Ambulatory Visit | Attending: Surgery | Admitting: Surgery

## 2014-06-14 DIAGNOSIS — Z853 Personal history of malignant neoplasm of breast: Secondary | ICD-10-CM | POA: Diagnosis present

## 2014-06-19 ENCOUNTER — Encounter: Payer: Self-pay | Admitting: *Deleted

## 2014-06-19 ENCOUNTER — Inpatient Hospital Stay: Payer: Medicare Other | Attending: Internal Medicine

## 2014-06-19 ENCOUNTER — Other Ambulatory Visit: Payer: Self-pay | Admitting: *Deleted

## 2014-06-19 VITALS — BP 152/76 | HR 62 | Temp 96.8°F | Resp 18

## 2014-06-19 DIAGNOSIS — I880 Nonspecific mesenteric lymphadenitis: Secondary | ICD-10-CM | POA: Insufficient documentation

## 2014-06-19 DIAGNOSIS — E041 Nontoxic single thyroid nodule: Secondary | ICD-10-CM | POA: Insufficient documentation

## 2014-06-19 DIAGNOSIS — D509 Iron deficiency anemia, unspecified: Secondary | ICD-10-CM | POA: Insufficient documentation

## 2014-06-19 DIAGNOSIS — Z170 Estrogen receptor positive status [ER+]: Secondary | ICD-10-CM | POA: Insufficient documentation

## 2014-06-19 DIAGNOSIS — K6389 Other specified diseases of intestine: Secondary | ICD-10-CM

## 2014-06-19 DIAGNOSIS — C50912 Malignant neoplasm of unspecified site of left female breast: Secondary | ICD-10-CM | POA: Insufficient documentation

## 2014-06-19 DIAGNOSIS — D500 Iron deficiency anemia secondary to blood loss (chronic): Secondary | ICD-10-CM | POA: Insufficient documentation

## 2014-06-19 DIAGNOSIS — Z79899 Other long term (current) drug therapy: Secondary | ICD-10-CM | POA: Diagnosis not present

## 2014-06-19 HISTORY — DX: Other specified diseases of intestine: K63.89

## 2014-06-19 HISTORY — DX: Malignant neoplasm of unspecified site of left female breast: C50.912

## 2014-06-19 MED ORDER — OCTREOTIDE ACETATE 20 MG IM KIT
20.0000 mg | PACK | Freq: Once | INTRAMUSCULAR | Status: AC
  Administered 2014-06-19: 20 mg via INTRAMUSCULAR
  Filled 2014-06-19: qty 1

## 2014-07-17 ENCOUNTER — Inpatient Hospital Stay: Payer: Medicare Other | Attending: Internal Medicine

## 2014-07-17 ENCOUNTER — Ambulatory Visit: Payer: Self-pay

## 2014-07-17 DIAGNOSIS — Z79899 Other long term (current) drug therapy: Secondary | ICD-10-CM | POA: Insufficient documentation

## 2014-07-17 DIAGNOSIS — R1900 Intra-abdominal and pelvic swelling, mass and lump, unspecified site: Secondary | ICD-10-CM | POA: Diagnosis present

## 2014-07-17 DIAGNOSIS — K6389 Other specified diseases of intestine: Secondary | ICD-10-CM

## 2014-07-17 MED ORDER — OCTREOTIDE ACETATE 20 MG IM KIT
20.0000 mg | PACK | Freq: Once | INTRAMUSCULAR | Status: AC
  Administered 2014-07-17: 20 mg via INTRAMUSCULAR
  Filled 2014-07-17: qty 1

## 2014-08-02 ENCOUNTER — Other Ambulatory Visit: Payer: Self-pay | Admitting: Otolaryngology

## 2014-08-02 DIAGNOSIS — E041 Nontoxic single thyroid nodule: Secondary | ICD-10-CM

## 2014-08-14 ENCOUNTER — Inpatient Hospital Stay: Payer: Medicare Other | Attending: Internal Medicine

## 2014-08-14 VITALS — BP 177/73 | HR 58 | Temp 98.7°F | Resp 18

## 2014-08-14 DIAGNOSIS — D509 Iron deficiency anemia, unspecified: Secondary | ICD-10-CM | POA: Insufficient documentation

## 2014-08-14 DIAGNOSIS — Z170 Estrogen receptor positive status [ER+]: Secondary | ICD-10-CM | POA: Diagnosis not present

## 2014-08-14 DIAGNOSIS — Z79899 Other long term (current) drug therapy: Secondary | ICD-10-CM | POA: Diagnosis not present

## 2014-08-14 DIAGNOSIS — C50912 Malignant neoplasm of unspecified site of left female breast: Secondary | ICD-10-CM | POA: Diagnosis not present

## 2014-08-14 DIAGNOSIS — E041 Nontoxic single thyroid nodule: Secondary | ICD-10-CM | POA: Insufficient documentation

## 2014-08-14 DIAGNOSIS — K6389 Other specified diseases of intestine: Secondary | ICD-10-CM

## 2014-08-14 MED ORDER — OCTREOTIDE ACETATE 20 MG IM KIT
20.0000 mg | PACK | Freq: Once | INTRAMUSCULAR | Status: AC
Start: 2014-08-14 — End: 2014-08-14
  Administered 2014-08-14: 20 mg via INTRAMUSCULAR
  Filled 2014-08-14: qty 1

## 2014-09-10 ENCOUNTER — Other Ambulatory Visit: Payer: Self-pay | Admitting: *Deleted

## 2014-09-10 DIAGNOSIS — D509 Iron deficiency anemia, unspecified: Secondary | ICD-10-CM

## 2014-09-11 ENCOUNTER — Inpatient Hospital Stay: Payer: Medicare Other

## 2014-09-11 ENCOUNTER — Inpatient Hospital Stay: Payer: Medicare Other | Attending: Internal Medicine

## 2014-09-11 ENCOUNTER — Inpatient Hospital Stay (HOSPITAL_BASED_OUTPATIENT_CLINIC_OR_DEPARTMENT_OTHER): Payer: Medicare Other | Admitting: Internal Medicine

## 2014-09-11 VITALS — BP 194/80 | HR 53 | Temp 96.7°F | Resp 18 | Ht 64.0 in | Wt 207.0 lb

## 2014-09-11 DIAGNOSIS — K6389 Other specified diseases of intestine: Secondary | ICD-10-CM

## 2014-09-11 DIAGNOSIS — D509 Iron deficiency anemia, unspecified: Secondary | ICD-10-CM

## 2014-09-11 DIAGNOSIS — Z170 Estrogen receptor positive status [ER+]: Secondary | ICD-10-CM

## 2014-09-11 DIAGNOSIS — Z79899 Other long term (current) drug therapy: Secondary | ICD-10-CM

## 2014-09-11 DIAGNOSIS — R197 Diarrhea, unspecified: Secondary | ICD-10-CM | POA: Diagnosis not present

## 2014-09-11 DIAGNOSIS — R1907 Generalized intra-abdominal and pelvic swelling, mass and lump: Secondary | ICD-10-CM | POA: Diagnosis not present

## 2014-09-11 DIAGNOSIS — I10 Essential (primary) hypertension: Secondary | ICD-10-CM | POA: Diagnosis not present

## 2014-09-11 DIAGNOSIS — C50912 Malignant neoplasm of unspecified site of left female breast: Secondary | ICD-10-CM

## 2014-09-11 DIAGNOSIS — E041 Nontoxic single thyroid nodule: Secondary | ICD-10-CM | POA: Insufficient documentation

## 2014-09-11 LAB — CBC WITH DIFFERENTIAL/PLATELET
Basophils Absolute: 0.1 10*3/uL (ref 0–0.1)
Basophils Relative: 1 %
Eosinophils Absolute: 0.4 10*3/uL (ref 0–0.7)
Eosinophils Relative: 6 %
HCT: 38.9 % (ref 35.0–47.0)
Hemoglobin: 13.1 g/dL (ref 12.0–16.0)
Lymphocytes Relative: 11 %
Lymphs Abs: 0.7 10*3/uL — ABNORMAL LOW (ref 1.0–3.6)
MCH: 29.7 pg (ref 26.0–34.0)
MCHC: 33.7 g/dL (ref 32.0–36.0)
MCV: 88.2 fL (ref 80.0–100.0)
Monocytes Absolute: 0.6 10*3/uL (ref 0.2–0.9)
Monocytes Relative: 10 %
Neutro Abs: 4.5 10*3/uL (ref 1.4–6.5)
Neutrophils Relative %: 72 %
Platelets: 228 10*3/uL (ref 150–440)
RBC: 4.41 MIL/uL (ref 3.80–5.20)
RDW: 14.2 % (ref 11.5–14.5)
WBC: 6.3 10*3/uL (ref 3.6–11.0)

## 2014-09-11 LAB — HEPATIC FUNCTION PANEL
ALBUMIN: 4 g/dL (ref 3.5–5.0)
ALK PHOS: 83 U/L (ref 38–126)
ALT: 16 U/L (ref 14–54)
AST: 26 U/L (ref 15–41)
Bilirubin, Direct: 0.2 mg/dL (ref 0.1–0.5)
Indirect Bilirubin: 0.4 mg/dL (ref 0.3–0.9)
TOTAL PROTEIN: 6.9 g/dL (ref 6.5–8.1)
Total Bilirubin: 0.6 mg/dL (ref 0.3–1.2)

## 2014-09-11 LAB — IRON AND TIBC
Iron: 64 ug/dL (ref 28–170)
Saturation Ratios: 16 % (ref 10.4–31.8)
TIBC: 409 ug/dL (ref 250–450)
UIBC: 345 ug/dL

## 2014-09-11 LAB — CREATININE, SERUM
CREATININE: 0.54 mg/dL (ref 0.44–1.00)
GFR calc Af Amer: >60 mL/min (ref >60)

## 2014-09-11 LAB — SAMPLE TO BLOOD BANK

## 2014-09-11 LAB — MISC LABCORP TEST (SEND OUT)
LabCorp test name: 3037
Labcorp test code: 3037

## 2014-09-11 LAB — FERRITIN: Ferritin: 25 ng/mL (ref 11–307)

## 2014-09-11 MED ORDER — OCTREOTIDE ACETATE 20 MG IM KIT
20.0000 mg | PACK | Freq: Once | INTRAMUSCULAR | Status: AC
  Administered 2014-09-11: 20 mg via INTRAMUSCULAR
  Filled 2014-09-11: qty 1

## 2014-09-12 LAB — CHROMOGRANIN A: CHROMOGRANIN A: 3 nmol/L (ref 0–5)

## 2014-09-27 NOTE — Progress Notes (Signed)
[facility]  [phone] [phone]     ID: Emma [name] OB: 1940-12-07  MR#: [id number]  JQB#:[id number]  [name], [name]  CHIEF COMPLAINT/DIAGNOSIS:  1. pT1a pN0 (sn) cM0  (clinical stage I)  grade 1 left breast invasive mammary carcinoma initially diagnosed on 12/29/12  by biopsy, then had lumpectomy and sentinel node study on 01/13/13. Tumor size 5 mm, margins negative. 2 sentinel lymph nodes negative. ER/PR positive (>90%).  HER-2/neu negative by FISH,  HER-2/CEP17 ratio 1.00. Patient got radiation, and then started hormonal therapy with anastrozole.  2. Anemia of iron deficiency - Patient is intolerant of oral iron; got parenteral iron therapy with 1 g IV Venofer in May/June 2013, then had repeat course of 1 g in February/March 2014.   3. 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 Also has Right adnexal mass is size stable from 2010. Serum CEA, CA-125, CA 19-9 and CA 27.29 all unremarkable. PET scan on 08/08/13 does not have significant metabolic activity in the mesenteric abnormality or in the stable right adnexal mass favoring benign etiology. Hypermetabolic nodule within the right lobe of thyroid gland. Differential includes thyroid carcinoma versus focal thyroiditis).  Started Octreotide LAR injection on 02/27/14.    HISTORY OF PRESENT ILLNESS:  Patient returns for continued oncology followup, she was seen few months ago. She is on octreotide long-acting injection, states that this has significantly helped her diarrhea which is under better control for the last few months. Denies any new abdominal pain,  nausea, vomiting, constipation, blood in stools or melena. She has been on parenteral iron therapy. Patient is here for follow-up of breast cancer, neuroendocrine tumor, IDA and octreotide injection. States that she continues to follow with GI, Dr. Vira Agar, colonoscopy on January 27 reported 3 nonbleeding colon angioectasias treated with argon, internal hemorrhoids. EGD on same date showed small hiatal hernia, gastritis. Patient states that fatigability is overall better. No new chest pain, dyspnea. Appetite is good.    REVIEW OF SYSTEMS:   ROS As in HPI above. In addition, no fever, chills or sweats. No new headaches or focal weakness.  No sore throat, cough, shortness of breath, sputum, hemoptysis or chest pain. No dizziness or palpitation. No abdominal pain, constipation, diarrhea, dysuria or hematuria. No new skin rash or bleeding symptoms. No new paresthesias in extremities.   PAST MEDICAL HISTORY: Reviewed. Past Medical History  Diagnosis Date  . Breast cancer, left breast 06/19/2014  . 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          hypertension  hypothyroidism  hyperlipidemia  GERD  poor hearing since childhood  history of iron deficiency anemia  history of GI bleed 2012 after reportedly black widow spider bite with negative GI workup (colonoscopy August 2012 single non-bleeding colonic angioectasia treated with thermal therapy, EGD August 2012 showed hiatus hernia and gastritis on biopsy with negative H. pylori)  Cholecystectomy  Arthritis  Allergies   PAST SURGICAL HISTORY: Reviewed. Past Surgical History  Procedure Laterality Date  . Breast lumpectomy with sentinel lymph node biopsy Left 2014    FAMILY HISTORY: Reviewed. Family History  Problem Relation Age of Onset  . Breast cancer Mother 61  . Breast cancer Maternal Aunt 68  SOCIAL HISTORY: Reviewed. No history of smoking or alcohol usage.  Allergies not on file  Current Outpatient Prescriptions  Medication Sig Dispense  Refill  . amLODipine (NORVASC) 5 MG tablet Take by mouth.    . labetalol (NORMODYNE) 300 MG tablet TAKE 1 TABLET (300 MG TOTAL) BY MOUTH 2 (TWO) TIMES DAILY.    Marland Kitchen levothyroxine (SYNTHROID) 88 MCG tablet Take by mouth.    . losartan-hydrochlorothiazide (HYZAAR) 100-25 MG per tablet Take by mouth.    Marland Kitchen anastrozole (ARIMIDEX) 1 MG tablet Take by mouth.    . esomeprazole (NEXIUM) 40 MG capsule Take by mouth.    Marland Kitchen ibuprofen (ADVIL,MOTRIN) 200 MG tablet Take by mouth.    Marland Kitchen octreotide (SANDOSTATIN LAR) 30 MG injection Inject into the muscle.     No current facility-administered medications for this visit.    PHYSICAL EXAM: Filed Vitals:   09/11/14 1134  BP: 194/80  Pulse: 53  Temp: 96.7 F (35.9 C)  Resp: 18     Body mass index is 35.52 kg/(m^2).    ECOG FS:1 - Symptomatic but completely ambulatory  GENERAL: Patient is alert and oriented and in no acute distress. There is no icterus or pallor. HEENT: EOMs intact. Oral exam negative for thrush or lesions. No cervical lymphadenopathy. CVS: S1S2, regular LUNGS: Bilaterally clear to auscultation, no rhonchi. ABDOMEN: Soft, nontender. No hepatomegaly clinically.  EXTREMITIES: No pedal edema.    LAB RESULTS: Creatinine 0.54, LFT unremarkable, serum iron 64, iron saturation 16, ferritin 25, hemoglobin 13.1.  Lab Results  Component Value Date   WBC 6.3 09/11/2014   NEUTROABS 4.5 09/11/2014   HGB 13.1 09/11/2014   HCT 38.9 09/11/2014   MCV 88.2 09/11/2014   PLT 228 09/11/2014    Lab Results  Component Value Date   IRON 64 09/11/2014   09/11/14 - serum chromogranin A is 3.   STUDIES: 06/14/14 - Mammogram. IMPRESSION: Benign findings.  RECOMMENDATION: Bilateral diagnostic mammogram in 1 year.BI-RADS CATEGORY 2: Benign.  01/31/14 - CT scan of abdomen/pelvis.  IMPRESSION:  Spiculated, retractile mesenteric mass with central calcification measuring 5.5 cm. While stable since 2015 exam, this is significantly increased in size since 2010  and is highly suspicious for a carcinoid tumor. Surgical evaluation should be considered. Stable 5.5 cm right adnexal mass, consistent with benign etiology. Differential diagnosis includes pedunculated fibroid and ovarian fibrothecoma. No definite evidence of metastatic breast carcinoma within the abdomen or pelvis.  08/08/13 - PET scan. IMPRESSION:   1. Mesenteric retractile mass slowly enlarging over time in the right mid abdominal does not have significant metabolic activity favoring a benign etiology. Of note carcinoid tumors can be hypormetabolic.  2. Stable right adnexal mass also does not have significant metabolic activity favoring benign etiology.   3. Hypermetabolic nodule within the right lobe of thyroid gland. Differential includes thyroid carcinoma versus focal thyroiditis. Recommend ultrasound of the thyroid gland.  07/12/13 - CT scan of abdomen/pelvis without contrast. IMPRESSION:   1. 2 mm distal right ureteral calculus with mild obstructive changes.     2. Slowly growing small bowel mesenteric mass, now up to 6 cm maximal diameter. 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     3. Right adnexal mass is size stable from 2010. Reportedly there has been right oophorectomy, correlate with surgical history/pathology and consider pelvic sonography.  06/08/13 - Mammogram.  IMPRESSION: No findings to suggest breast malignancy. Left breast scarring.  RECOMMENDATION: Bilateral diagnostic mammograms in 1 year.  BI-RADS CATEGORY 2: Benign  Finding(s)  April 2014 - DEXA scan reported mild osteopenia left femur neck (T-score -1.1), otherwise unremarkable.  May 2013 - Hb 9.2, WBC/platelets normal, MCV 69, retic 0.059, ferritin 5, serum iron 25, iron sat 5%, TIBC 515, EPO 71.7.  B12 and folate level, ANA, creatinine, LDH, haptoglobin, direct Coombs test, SIEP and random-UIEP all unremarkable.    ASSESSMENT / PLAN:   1. Mesenteric Mass, presumed Neuroendocrine  Tumor evaluated by Dr.Herbert Bearl Mulberry at [facility] in Oct 2015 (prior CT scan of 07/12/13 showed mass upto 6 cm maximal diameter. Also has Right adnexal mass is size stable from 2010. Tumor markers including CEA, CA-125, CA 19-9 and CA 27.29 were in normal range. PET scan does not light up in the mesenteric abnormality or right adnexal mass area, there is PET-positive thyroid nodule. Recent CT scan continues to show large mesenteric mass up to 5.5 cm, stable since 2015 -   have reviewed labs from today and discussed the patient, serum chromogranin A is normal range. Diarrhea has improved significantly after starting on Octreotide LAR injection since February 1, tolerating well so far. Will continue octreotide LAR injection 20 mg IM once every 4 weeks and monitor for any new side effects. Next MD followup in 16 weeks with repeat labs and make further plan of management. 2. pT1a pN0 (sn) cM0  (clinical stage I)  grade 1 left breast invasive mammary carcinoma initially diagnosed on 12/29/12  by biopsy, then had lumpectomy and sentinel node study on 01/13/13.  Tumor size 5 mm margins negative. 2 sentinel lymph nodes negative. ER/PR positive (>90%).  HER-2/neu negative by FISH,  HER-2/CEP17 ratio 1.00 -  doing well without clinical evidence of recurrent breast cancer. Recent mammogram in May was reported benign findings, BI-RADS 2. Continue hormonal therapy with anastrozole 1 mg p.o. daily. DEXA scan in April 2014 showed mild osteopenia only with left femur neck T-score of -1.1. Continue calcium plus vitamin D. Will continue clinical surveillance when she comes for appointments for octreotide injections.   3. Anemia of iron deficiency, patient is intolerant of oral iron and receives parenteral iron therapy intermittently  -   Hemoglobin is better, iron def on  labs from March. She has received another course of IV Venofer. Recent colonoscopy on January 27 reported 3 nonbleeding colon angioectasias treated with  argon, internal hemorrhoids, and EGD reported small hiatal hernia and gastritis. Will closely monitor hemoglobin and iron study once every 8 weeks, and consider parenteral iron therapy if she develops recurrent iron deficiency anemia.Marland Kitchen  4. Thyroid Nodule - follows with ENT. No new symptoms.  5. In between visits, patient advised to call or come to ER in case of any progressive symptoms or acute sickness. She is agreeable to this plan.     Leia Alf, MD   09/27/2014 5:32 PM

## 2014-10-09 ENCOUNTER — Other Ambulatory Visit: Payer: Medicare Other

## 2014-10-09 ENCOUNTER — Inpatient Hospital Stay: Payer: Medicare Other | Attending: Internal Medicine

## 2014-10-09 VITALS — BP 169/75 | HR 53 | Temp 97.0°F | Resp 18

## 2014-10-09 DIAGNOSIS — E041 Nontoxic single thyroid nodule: Secondary | ICD-10-CM | POA: Insufficient documentation

## 2014-10-09 DIAGNOSIS — I10 Essential (primary) hypertension: Secondary | ICD-10-CM | POA: Diagnosis not present

## 2014-10-09 DIAGNOSIS — R1907 Generalized intra-abdominal and pelvic swelling, mass and lump: Secondary | ICD-10-CM | POA: Insufficient documentation

## 2014-10-09 DIAGNOSIS — C50912 Malignant neoplasm of unspecified site of left female breast: Secondary | ICD-10-CM | POA: Insufficient documentation

## 2014-10-09 DIAGNOSIS — Z170 Estrogen receptor positive status [ER+]: Secondary | ICD-10-CM | POA: Insufficient documentation

## 2014-10-09 DIAGNOSIS — D509 Iron deficiency anemia, unspecified: Secondary | ICD-10-CM | POA: Diagnosis not present

## 2014-10-09 DIAGNOSIS — Z79899 Other long term (current) drug therapy: Secondary | ICD-10-CM | POA: Insufficient documentation

## 2014-10-09 DIAGNOSIS — K6389 Other specified diseases of intestine: Secondary | ICD-10-CM

## 2014-10-09 MED ORDER — OCTREOTIDE ACETATE 20 MG IM KIT
20.0000 mg | PACK | Freq: Once | INTRAMUSCULAR | Status: AC
  Administered 2014-10-09: 20 mg via INTRAMUSCULAR
  Filled 2014-10-09: qty 1

## 2014-11-06 ENCOUNTER — Inpatient Hospital Stay: Payer: Medicare Other | Attending: Internal Medicine

## 2014-11-06 ENCOUNTER — Inpatient Hospital Stay: Payer: Medicare Other

## 2014-11-06 VITALS — BP 195/76 | HR 55

## 2014-11-06 DIAGNOSIS — Z79899 Other long term (current) drug therapy: Secondary | ICD-10-CM | POA: Insufficient documentation

## 2014-11-06 DIAGNOSIS — R1907 Generalized intra-abdominal and pelvic swelling, mass and lump: Secondary | ICD-10-CM | POA: Diagnosis not present

## 2014-11-06 DIAGNOSIS — Z170 Estrogen receptor positive status [ER+]: Secondary | ICD-10-CM | POA: Insufficient documentation

## 2014-11-06 DIAGNOSIS — D509 Iron deficiency anemia, unspecified: Secondary | ICD-10-CM | POA: Insufficient documentation

## 2014-11-06 DIAGNOSIS — C50912 Malignant neoplasm of unspecified site of left female breast: Secondary | ICD-10-CM | POA: Diagnosis not present

## 2014-11-06 DIAGNOSIS — K6389 Other specified diseases of intestine: Secondary | ICD-10-CM

## 2014-11-06 LAB — IRON AND TIBC
Iron: 50 ug/dL (ref 28–170)
SATURATION RATIOS: 12 % (ref 10.4–31.8)
TIBC: 414 ug/dL (ref 250–450)
UIBC: 364 ug/dL

## 2014-11-06 LAB — FERRITIN: Ferritin: 17 ng/mL (ref 11–307)

## 2014-11-06 LAB — SAMPLE TO BLOOD BANK

## 2014-11-06 LAB — HEMOGLOBIN: Hemoglobin: 13.5 g/dL (ref 12.0–16.0)

## 2014-11-06 MED ORDER — OCTREOTIDE ACETATE 20 MG IM KIT
20.0000 mg | PACK | Freq: Once | INTRAMUSCULAR | Status: AC
  Administered 2014-11-06: 20 mg via INTRAMUSCULAR
  Filled 2014-11-06: qty 1

## 2014-11-08 ENCOUNTER — Telehealth: Payer: Self-pay | Admitting: *Deleted

## 2014-11-08 NOTE — Telephone Encounter (Signed)
Called daughter and spoke to her about her Mom's labs.  Her hgb 13.5 which is normal.  Her ferritin is on the low side of normal and her other iron studies are within in normal limits and Dr. B  does not feel that she needs iron right now.  She will have her labs checked again in 8 weeks and see Dr. Jacinto Reap.  If in between she feels more fatigued than normal or sob on exertion or blood in stools she can call and we can make changes.  She will tell her mom.

## 2014-11-20 ENCOUNTER — Ambulatory Visit
Admission: RE | Admit: 2014-11-20 | Discharge: 2014-11-20 | Disposition: A | Discharge status: Home / Self Care | Payer: Medicare Other | Source: Ambulatory Visit | Attending: Radiation Oncology | Admitting: Radiation Oncology

## 2014-11-20 ENCOUNTER — Encounter: Payer: Self-pay | Admitting: Radiation Oncology

## 2014-11-20 VITALS — BP 188/77 | HR 56 | Temp 96.4°F | Resp 20 | Wt 205.7 lb

## 2014-11-20 DIAGNOSIS — C50912 Malignant neoplasm of unspecified site of left female breast: Secondary | ICD-10-CM

## 2014-11-20 NOTE — Progress Notes (Signed)
Radiation Oncology Follow up Note  Name: Emma [name]   Date:   11/20/2014 MRN:  [id number] DOB: 07/07/1940    This 74 y.o. female presents to the clinic today for Follow-up for breast cancer stage I ER/PR positive HER-2/neu negative.  REFERRING PROVIDER: No ref. 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 She is seen today in routine follow-up and is doing well. She specifically denies breast tenderness cough or bone pain. Her most recent mammogram was BI-RADS 2. 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 She is currently on anastrozole tongue that well without side effect.  COMPLICATIONS OF TREATMENT: none  FOLLOW UP COMPLIANCE: keeps appointments   PHYSICAL EXAM:  BP 188/77 mmHg  Pulse 56  Temp(Src) 96.4 F (35.8 C)  Resp 20  Wt 205 lb 11 oz (93.3 kg) Lungs are clear to A&P cardiac examination essentially unremarkable with regular rate and rhythm. No dominant mass or nodularity is noted in either breast in 2 positions examined. Incision is well-healed. No axillary or supraclavicular adenopathy is appreciated. Cosmetic result is excellent. Well-developed well-nourished patient in NAD. HEENT reveals PERLA, EOMI, discs not visualized.  Oral cavity is clear. No oral mucosal lesions are identified. Neck is clear without evidence of cervical or supraclavicular adenopathy. Lungs are clear to A&P. Cardiac examination is essentially unremarkable with regular rate and rhythm without murmur rub or thrill. Abdomen is benign with no organomegaly or masses noted. Motor sensory and DTR levels are equal and symmetric in the upper and lower extremities. Cranial nerves II through XII are grossly intact. Proprioception is intact. No peripheral adenopathy or edema  is identified. No motor or sensory levels are noted. Crude visual fields are within normal range.  RADIOLOGY RESULTS: Recent mammograms reviewed compatible above-stated findings  PLAN: At the present time she is doing well with no evidence of disease a year and half out. I have asked to see her back in 1 year for follow-up. She continues on anastrozole without side effect. Patient knows to call with any concerns.  I would like to take this opportunity for allowing me to participate in the care of your patient.Armstead Peaks., MD

## 2014-12-04 ENCOUNTER — Inpatient Hospital Stay: Payer: Medicare Other | Attending: Family Medicine

## 2014-12-04 DIAGNOSIS — K6389 Other specified diseases of intestine: Secondary | ICD-10-CM

## 2014-12-04 DIAGNOSIS — Z170 Estrogen receptor positive status [ER+]: Secondary | ICD-10-CM | POA: Insufficient documentation

## 2014-12-04 DIAGNOSIS — C50912 Malignant neoplasm of unspecified site of left female breast: Secondary | ICD-10-CM | POA: Insufficient documentation

## 2014-12-04 DIAGNOSIS — Z79899 Other long term (current) drug therapy: Secondary | ICD-10-CM | POA: Insufficient documentation

## 2014-12-04 DIAGNOSIS — R1907 Generalized intra-abdominal and pelvic swelling, mass and lump: Secondary | ICD-10-CM | POA: Insufficient documentation

## 2014-12-04 DIAGNOSIS — D509 Iron deficiency anemia, unspecified: Secondary | ICD-10-CM

## 2014-12-04 LAB — SAMPLE TO BLOOD BANK

## 2014-12-04 LAB — IRON AND TIBC
IRON: 73 ug/dL (ref 28–170)
Saturation Ratios: 17 % (ref 10.4–31.8)
TIBC: 432 ug/dL (ref 250–450)
UIBC: 359 ug/dL

## 2014-12-04 LAB — HEMOGLOBIN: Hemoglobin: 13.7 g/dL (ref 12.0–16.0)

## 2014-12-04 LAB — FERRITIN: Ferritin: 16 ng/mL (ref 11–307)

## 2014-12-04 MED ORDER — OCTREOTIDE ACETATE 20 MG IM KIT
20.0000 mg | PACK | Freq: Once | INTRAMUSCULAR | Status: AC
  Administered 2014-12-04: 20 mg via INTRAMUSCULAR
  Filled 2014-12-04: qty 1

## 2014-12-06 ENCOUNTER — Ambulatory Visit
Admission: RE | Admit: 2014-12-06 | Discharge: 2014-12-06 | Disposition: A | Discharge status: Home / Self Care | Payer: Medicare Other | Source: Ambulatory Visit | Attending: Otolaryngology | Admitting: Otolaryngology

## 2014-12-06 DIAGNOSIS — E041 Nontoxic single thyroid nodule: Secondary | ICD-10-CM | POA: Diagnosis present

## 2014-12-06 DIAGNOSIS — E042 Nontoxic multinodular goiter: Secondary | ICD-10-CM | POA: Insufficient documentation

## 2014-12-29 ENCOUNTER — Other Ambulatory Visit: Payer: Self-pay | Admitting: *Deleted

## 2014-12-29 DIAGNOSIS — C50912 Malignant neoplasm of unspecified site of left female breast: Secondary | ICD-10-CM

## 2014-12-29 DIAGNOSIS — K6389 Other specified diseases of intestine: Secondary | ICD-10-CM

## 2014-12-29 DIAGNOSIS — D509 Iron deficiency anemia, unspecified: Secondary | ICD-10-CM

## 2015-01-01 ENCOUNTER — Inpatient Hospital Stay: Payer: Medicare Other

## 2015-01-01 ENCOUNTER — Inpatient Hospital Stay: Payer: Medicare Other | Attending: Internal Medicine

## 2015-01-01 ENCOUNTER — Inpatient Hospital Stay (HOSPITAL_BASED_OUTPATIENT_CLINIC_OR_DEPARTMENT_OTHER): Payer: Medicare Other | Admitting: Internal Medicine

## 2015-01-01 VITALS — BP 188/75 | HR 58 | Temp 97.7°F | Resp 18

## 2015-01-01 DIAGNOSIS — C50912 Malignant neoplasm of unspecified site of left female breast: Secondary | ICD-10-CM

## 2015-01-01 DIAGNOSIS — N839 Noninflammatory disorder of ovary, fallopian tube and broad ligament, unspecified: Secondary | ICD-10-CM | POA: Insufficient documentation

## 2015-01-01 DIAGNOSIS — Z170 Estrogen receptor positive status [ER+]: Secondary | ICD-10-CM | POA: Insufficient documentation

## 2015-01-01 DIAGNOSIS — C7B02 Secondary carcinoid tumors of liver: Secondary | ICD-10-CM | POA: Diagnosis not present

## 2015-01-01 DIAGNOSIS — K6389 Other specified diseases of intestine: Secondary | ICD-10-CM

## 2015-01-01 DIAGNOSIS — E041 Nontoxic single thyroid nodule: Secondary | ICD-10-CM | POA: Diagnosis not present

## 2015-01-01 DIAGNOSIS — Z79811 Long term (current) use of aromatase inhibitors: Secondary | ICD-10-CM | POA: Insufficient documentation

## 2015-01-01 DIAGNOSIS — C7A010 Malignant carcinoid tumor of the duodenum: Secondary | ICD-10-CM

## 2015-01-01 DIAGNOSIS — D509 Iron deficiency anemia, unspecified: Secondary | ICD-10-CM

## 2015-01-01 LAB — CBC WITH DIFFERENTIAL/PLATELET
BASOS ABS: 0 10*3/uL (ref 0–0.1)
Basophils Relative: 0 %
EOS ABS: 0.1 10*3/uL (ref 0–0.7)
EOS PCT: 1 %
HCT: 37.8 % (ref 35.0–47.0)
HEMOGLOBIN: 12.4 g/dL (ref 12.0–16.0)
LYMPHS ABS: 0.8 10*3/uL — AB (ref 1.0–3.6)
LYMPHS PCT: 10 %
MCH: 28.8 pg (ref 26.0–34.0)
MCHC: 32.8 g/dL (ref 32.0–36.0)
MCV: 88 fL (ref 80.0–100.0)
Monocytes Absolute: 1.1 10*3/uL — ABNORMAL HIGH (ref 0.2–0.9)
Monocytes Relative: 13 %
NEUTROS PCT: 76 %
Neutro Abs: 6.3 10*3/uL (ref 1.4–6.5)
PLATELETS: 206 10*3/uL (ref 150–440)
RBC: 4.3 MIL/uL (ref 3.80–5.20)
RDW: 14.3 % (ref 11.5–14.5)
WBC: 8.3 10*3/uL (ref 3.6–11.0)

## 2015-01-01 LAB — CREATININE, SERUM
Creatinine, Ser: 0.68 mg/dL (ref 0.44–1.00)
GFR calc Af Amer: >60 mL/min (ref >60)
GFR calc non Af Amer: >60 mL/min (ref >60)

## 2015-01-01 LAB — IRON AND TIBC
IRON: 50 ug/dL (ref 28–170)
Saturation Ratios: 10 % — ABNORMAL LOW (ref 10.4–31.8)
TIBC: 485 ug/dL — AB (ref 250–450)
UIBC: 435 ug/dL

## 2015-01-01 LAB — SAMPLE TO BLOOD BANK

## 2015-01-01 LAB — FERRITIN: FERRITIN: 11 ng/mL (ref 11–307)

## 2015-01-01 MED ORDER — OCTREOTIDE ACETATE 20 MG IM KIT
20.0000 mg | PACK | Freq: Once | INTRAMUSCULAR | Status: AC
  Administered 2015-01-01: 20 mg via INTRAMUSCULAR
  Filled 2015-01-01: qty 1

## 2015-01-01 NOTE — Progress Notes (Signed)
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 I reviewed the patient's prior charts/pertinent labs/imaging in detail; findings are summarized above. I also reviewed the note from [place] from [facility] in detail/summary as above.   A very pleasant 74 year old female patient with above history of breast cancer and also mesenteric mass/small bowel carcinoid is here for follow-up. Patient's appetite is good. Denies any weight loss. Denies any significant diarrhea.  Patient denies any new lumps or bumps. She denies any unusual hot flashes or worsening joint pains.   REVIEW OF SYSTEMS:  A complete 10 point review of system is done which is negative except mentioned above/history of present illness.   PAST MEDICAL HISTORY :  Past Medical History  Diagnosis Date  . Breast cancer, left breast ([place]) 06/19/2014  . 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    PAST SURGICAL HISTORY :   Past Surgical History  Procedure Laterality Date  . Breast lumpectomy with sentinel lymph node biopsy Left 2014    FAMILY HISTORY :   Family History  Problem Relation Age of Onset  . Breast cancer Mother 77  . Breast cancer Maternal Aunt 68    SOCIAL HISTORY:   Social History  Substance Use Topics  . Smoking status: Never Smoker   .  Smokeless tobacco: Never Used  . Alcohol Use: Not on file    ALLERGIES:  is allergic to sulfa antibiotics.  MEDICATIONS:  Current Outpatient Prescriptions  Medication Sig Dispense Refill  . anastrozole (ARIMIDEX) 1 MG tablet Take by mouth.    Marland Kitchen ibuprofen (ADVIL,MOTRIN) 200 MG tablet Take by mouth.    . labetalol (NORMODYNE) 300 MG tablet TAKE 1 TABLET (300 MG TOTAL) BY MOUTH 2 (TWO) TIMES DAILY.    Marland Kitchen levothyroxine (SYNTHROID) 88 MCG tablet Take by mouth.    . losartan-hydrochlorothiazide (HYZAAR) 100-25 MG per tablet Take by mouth.    Marland Kitchen octreotide (SANDOSTATIN LAR) 30 MG injection Inject into the muscle.    Marland Kitchen amLODipine (NORVASC) 5 MG tablet Take by mouth.    . Calcium-Vitamin D 600-200 MG-UNIT tablet Take by mouth.    . esomeprazole (NEXIUM) 40 MG capsule Take by mouth.    Marland Kitchen HYDROcodone-acetaminophen (NORCO/VICODIN) 5-325 MG tablet Take by mouth.     No current facility-administered medications for this visit.    PHYSICAL EXAMINATION: ECOG PERFORMANCE STATUS: 0 - Asymptomatic  BP 193/77 mmHg  Pulse 58  Temp(Src) 97.7 F (36.5 C) (Tympanic)  Resp 18  There were no vitals filed for this visit.  GENERAL: Well-nourished well-developed; Alert, no distress and comfortable.  Alone; hard of hearing.  EYES: no pallor or icterus OROPHARYNX: no thrush or ulceration; good dentition  NECK: supple, no masses felt LYMPH:  no palpable lymphadenopathy in the cervical, axillary or inguinal regions LUNGS: clear to auscultation and  No wheeze or  crackles HEART/CVS: regular rate & rhythm and no murmurs; No lower extremity edema ABDOMEN:abdomen soft, non-tender and normal bowel sounds Musculoskeletal:no cyanosis of digits and no clubbing  PSYCH: alert & oriented x 3 with fluent speech NEURO: no focal motor/sensory deficits SKIN:  no rashes or significant lesions  LABORATORY DATA:  I have reviewed the data as listed    Component Value Date/Time   NA 137 07/12/2013 0841   K 4.7  07/12/2013 0841   CL 107 07/12/2013 0841   CO2 20* 07/12/2013 0841   GLUCOSE 122* 07/12/2013 0841   BUN 18 07/12/2013 0841   CREATININE 0.68 01/01/2015 1043   CREATININE 0.64 05/22/2014 1111   CALCIUM 9.2 07/12/2013 0841   PROT 6.9 09/11/2014 1110   PROT 7.0 05/22/2014 1111   ALBUMIN 4.0 09/11/2014 1110   ALBUMIN 4.2 05/22/2014 1111   AST 26 09/11/2014 1110   AST 29 05/22/2014 1111   ALT 16 09/11/2014 1110   ALT 17 05/22/2014 1111   ALKPHOS 83 09/11/2014 1110   ALKPHOS 95 05/22/2014 1111   BILITOT 0.6 09/11/2014 1110   BILITOT 0.8 05/22/2014 1111   GFRNONAA >60 01/01/2015 1043   GFRNONAA >60 05/22/2014 1111   GFRNONAA >60 01/31/2014 1019   GFRAA >60 01/01/2015 1043   GFRAA >60 05/22/2014 1111   GFRAA >60 01/31/2014 1019    No results found for: SPEP, UPEP  Lab Results  Component Value Date   WBC 8.3 01/01/2015   NEUTROABS 6.3 01/01/2015   HGB 12.4 01/01/2015   HCT 37.8 01/01/2015   MCV 88.0 01/01/2015   PLT 206 01/01/2015      Chemistry      Component Value Date/Time   NA 137 07/12/2013 0841   K 4.7 07/12/2013 0841   CL 107 07/12/2013 0841   CO2 20* 07/12/2013 0841   BUN 18 07/12/2013 0841   CREATININE 0.68 01/01/2015 1043   CREATININE 0.64 05/22/2014 1111      Component Value Date/Time   CALCIUM 9.2 07/12/2013 0841   ALKPHOS 83 09/11/2014 1110   ALKPHOS 95 05/22/2014 1111   AST 26 09/11/2014 1110   AST 29 05/22/2014 1111   ALT 16 09/11/2014 1110   ALT 17 05/22/2014 1111   BILITOT 0.6 09/11/2014 1110   BILITOT 0.8 05/22/2014 1111       RADIOGRAPHIC STUDIES: I have personally reviewed the radiological images as listed and agreed with the findings in the report. No results found.   ASSESSMENT & PLAN:   # LEFT BREAST CA STAGE I- ER/PR POS: her -2 NEG- on Arimidex. Clinically no evidence of recurrence noted. Continue Arimidex calcium and vitamin D for now. Patient's recent mammogram May 2016 within normal limits.  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 Clinically no evidence of progression noted at this time. However I would recommend getting an awkward rate scan approximately 2 months for further evaluation.  # Thyroid nodule-status post biopsy; recommend contacting ENT for results.; Ovarian mass-stable for many years and likely benign cyst.  # 25 minutes face-to-face with the patient discussing the above plan of care; more than 50% of time spent on prognosis/ natural history; counseling and coordination.     [name], MD 01/01/2015 11:01 AM

## 2015-01-01 NOTE — Progress Notes (Signed)
Pt stated stopping taking her norvasc. BP recheck. Patient encouraged notify PCP that she stopped taking. Pt states understanding.

## 2015-01-03 LAB — CHROMOGRANIN A: CHROMOGRANIN A: 3 nmol/L (ref 0–5)

## 2015-02-01 ENCOUNTER — Inpatient Hospital Stay: Payer: Medicare Other | Attending: Internal Medicine

## 2015-02-01 DIAGNOSIS — Z170 Estrogen receptor positive status [ER+]: Secondary | ICD-10-CM | POA: Diagnosis not present

## 2015-02-01 DIAGNOSIS — Z79899 Other long term (current) drug therapy: Secondary | ICD-10-CM | POA: Insufficient documentation

## 2015-02-01 DIAGNOSIS — C7B02 Secondary carcinoid tumors of liver: Secondary | ICD-10-CM | POA: Diagnosis not present

## 2015-02-01 DIAGNOSIS — K6389 Other specified diseases of intestine: Secondary | ICD-10-CM

## 2015-02-01 DIAGNOSIS — C50912 Malignant neoplasm of unspecified site of left female breast: Secondary | ICD-10-CM | POA: Insufficient documentation

## 2015-02-01 MED ORDER — OCTREOTIDE ACETATE 20 MG IM KIT
20.0000 mg | PACK | Freq: Once | INTRAMUSCULAR | Status: AC
  Administered 2015-02-01: 20 mg via INTRAMUSCULAR
  Filled 2015-02-01: qty 1

## 2015-02-27 ENCOUNTER — Encounter
Admission: RE | Admit: 2015-02-27 | Discharge: 2015-02-27 | Disposition: A | Discharge status: Home / Self Care | Payer: Medicare Other | Source: Ambulatory Visit | Attending: Internal Medicine | Admitting: Internal Medicine

## 2015-02-27 DIAGNOSIS — C50912 Malignant neoplasm of unspecified site of left female breast: Secondary | ICD-10-CM | POA: Insufficient documentation

## 2015-02-27 DIAGNOSIS — C7A010 Malignant carcinoid tumor of the duodenum: Secondary | ICD-10-CM | POA: Insufficient documentation

## 2015-02-27 MED ORDER — INDIUM IN-111 PENTETREOTIDE IV KIT
5.6700 | PACK | Freq: Once | INTRAVENOUS | Status: AC | PRN
  Administered 2015-02-27: 5.67 via INTRAVENOUS

## 2015-02-28 ENCOUNTER — Encounter
Admission: RE | Admit: 2015-02-28 | Discharge: 2015-02-28 | Disposition: A | Discharge status: Home / Self Care | Payer: Medicare Other | Source: Ambulatory Visit | Attending: Internal Medicine | Admitting: Internal Medicine

## 2015-02-28 DIAGNOSIS — C7A010 Malignant carcinoid tumor of the duodenum: Secondary | ICD-10-CM | POA: Diagnosis not present

## 2015-02-28 DIAGNOSIS — C50912 Malignant neoplasm of unspecified site of left female breast: Secondary | ICD-10-CM | POA: Diagnosis present

## 2015-03-01 ENCOUNTER — Encounter: Admission: RE | Admit: 2015-03-01 | Payer: Medicare Other | Source: Ambulatory Visit

## 2015-03-05 ENCOUNTER — Inpatient Hospital Stay: Payer: Medicare Other

## 2015-03-05 ENCOUNTER — Inpatient Hospital Stay: Payer: Medicare Other | Attending: Internal Medicine | Admitting: Internal Medicine

## 2015-03-05 VITALS — BP 190/82 | HR 61 | Temp 97.4°F | Resp 18 | Ht 64.0 in | Wt 213.8 lb

## 2015-03-05 DIAGNOSIS — D509 Iron deficiency anemia, unspecified: Secondary | ICD-10-CM | POA: Diagnosis not present

## 2015-03-05 DIAGNOSIS — Z79899 Other long term (current) drug therapy: Secondary | ICD-10-CM | POA: Diagnosis not present

## 2015-03-05 DIAGNOSIS — C50912 Malignant neoplasm of unspecified site of left female breast: Secondary | ICD-10-CM

## 2015-03-05 DIAGNOSIS — Z170 Estrogen receptor positive status [ER+]: Secondary | ICD-10-CM | POA: Insufficient documentation

## 2015-03-05 DIAGNOSIS — C7A010 Malignant carcinoid tumor of the duodenum: Secondary | ICD-10-CM

## 2015-03-05 DIAGNOSIS — R634 Abnormal weight loss: Secondary | ICD-10-CM | POA: Diagnosis not present

## 2015-03-05 DIAGNOSIS — Z79811 Long term (current) use of aromatase inhibitors: Secondary | ICD-10-CM | POA: Diagnosis not present

## 2015-03-05 DIAGNOSIS — C7B02 Secondary carcinoid tumors of liver: Secondary | ICD-10-CM | POA: Insufficient documentation

## 2015-03-05 DIAGNOSIS — K6389 Other specified diseases of intestine: Secondary | ICD-10-CM

## 2015-03-05 LAB — CBC WITH DIFFERENTIAL/PLATELET
BASOS PCT: 1 %
Basophils Absolute: 0 10*3/uL (ref 0–0.1)
EOS ABS: 0.2 10*3/uL (ref 0–0.7)
Eosinophils Relative: 4 %
HEMATOCRIT: 33.5 % — AB (ref 35.0–47.0)
HEMOGLOBIN: 10.9 g/dL — AB (ref 12.0–16.0)
LYMPHS ABS: 0.6 10*3/uL — AB (ref 1.0–3.6)
Lymphocytes Relative: 11 %
MCH: 26.3 pg (ref 26.0–34.0)
MCHC: 32.4 g/dL (ref 32.0–36.0)
MCV: 81 fL (ref 80.0–100.0)
MONO ABS: 0.7 10*3/uL (ref 0.2–0.9)
MONOS PCT: 12 %
NEUTROS ABS: 4.5 10*3/uL (ref 1.4–6.5)
NEUTROS PCT: 72 %
Platelets: 218 10*3/uL (ref 150–440)
RBC: 4.14 MIL/uL (ref 3.80–5.20)
RDW: 14.8 % — AB (ref 11.5–14.5)
WBC: 6.1 10*3/uL (ref 3.6–11.0)

## 2015-03-05 LAB — HEPATIC FUNCTION PANEL
ALBUMIN: 4 g/dL (ref 3.5–5.0)
ALK PHOS: 92 U/L (ref 38–126)
ALT: 16 U/L (ref 14–54)
AST: 23 U/L (ref 15–41)
BILIRUBIN TOTAL: 0.7 mg/dL (ref 0.3–1.2)
Total Protein: 6.5 g/dL (ref 6.5–8.1)

## 2015-03-05 LAB — CREATININE, SERUM
Creatinine, Ser: 0.46 mg/dL (ref 0.44–1.00)
GFR calc Af Amer: >60 mL/min (ref >60)

## 2015-03-05 MED ORDER — OCTREOTIDE ACETATE 20 MG IM KIT
20.0000 mg | PACK | Freq: Once | INTRAMUSCULAR | Status: AC
  Administered 2015-03-05: 20 mg via INTRAMUSCULAR
  Filled 2015-03-05: qty 1

## 2015-03-05 NOTE — Progress Notes (Signed)
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   Denies any blood in stools or black stools. She admits to abdominal discomfort/poor by mouth intolerance to iron.  Patient's appetite is good. Denies any weight loss. Denies any significant diarrhea. Patient denies any new lumps or bumps. She denies any unusual hot flashes or worsening joint pains.  REVIEW OF SYSTEMS:  A complete 10 point review of system is done which is negative except mentioned above/history of present illness.   PAST MEDICAL HISTORY :  Past Medical History  Diagnosis Date  . Breast cancer, left breast ([place] 06/19/2014  . 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    PAST SURGICAL HISTORY :   Past Surgical History  Procedure Laterality Date  . Breast lumpectomy with sentinel lymph node biopsy Left 2014    FAMILY HISTORY :   Family History  Problem Relation Age of Onset  . Breast cancer Mother 735 . Breast cancer Maternal Aunt 68    SOCIAL HISTORY:   Social History  Substance Use Topics  . Smoking status: Never Smoker   . Smokeless tobacco: Never Used  . Alcohol Use: Not on file    ALLERGIES:  is  allergic to sulfa antibiotics.  MEDICATIONS:  Current Outpatient Prescriptions  Medication Sig Dispense Refill  . amLODipine (NORVASC) 5 MG tablet Take by mouth.    .Marland Kitchenanastrozole (ARIMIDEX) 1 MG tablet Take by mouth.    . Calcium-Vitamin D 600-200 MG-UNIT tablet Take by mouth.    . esomeprazole (NEXIUM) 40 MG capsule Take by mouth.    . fluticasone (FLONASE) 50 MCG/ACT nasal spray Place 1 spray into both nostrils 2 (two) times daily.  0  . HYDROcodone-acetaminophen (NORCO/VICODIN) 5-325 MG tablet Take by mouth.    .Marland Kitchenibuprofen (ADVIL,MOTRIN) 200 MG tablet Take by mouth.    . labetalol (NORMODYNE) 300 MG tablet TAKE 1 TABLET (300 MG TOTAL) BY MOUTH 2 (TWO) TIMES DAILY.    .Marland Kitchenlevothyroxine (SYNTHROID) 88 MCG tablet Take by mouth.    . losartan-hydrochlorothiazide (HYZAAR) 100-25 MG per tablet Take by mouth.    .Marland Kitchenoctreotide (SANDOSTATIN LAR) 30 MG injection Inject into the muscle.     No current facility-administered medications for this visit.    PHYSICAL EXAMINATION: ECOG PERFORMANCE STATUS: 0 - Asymptomatic  BP 190/82 mmHg  Pulse 61  Temp(Src) 97.4 F (36.3 C) (Tympanic)  Resp 18  Ht 5' 4"  (1.626 m)  Wt 213 lb 13.5 oz (97 kg)  BMI 36.69 kg/m2  Filed Weights   03/05/15 1352  Weight: 213 lb 13.5 oz (97 kg)    GENERAL: Well-nourished well-developed; Alert, no distress and comfortable.  Alone; hard of hearing.  EYES: no pallor  or icterus OROPHARYNX: no thrush or ulceration; good dentition  NECK: supple, no masses felt LYMPH:  no palpable lymphadenopathy in the cervical, axillary or inguinal regions LUNGS: clear to auscultation and  No wheeze or crackles HEART/CVS: regular rate & rhythm and no murmurs; No lower extremity edema ABDOMEN:abdomen soft, non-tender and normal bowel sounds Musculoskeletal:no cyanosis of digits and no clubbing  PSYCH: alert & oriented x 3 with fluent speech NEURO: no focal motor/sensory deficits SKIN:  no rashes or significant lesions  LABORATORY  DATA:  I have reviewed the data as listed    Component Value Date/Time   NA 137 07/12/2013 0841   K 4.7 07/12/2013 0841   CL 107 07/12/2013 0841   CO2 20* 07/12/2013 0841   GLUCOSE 122* 07/12/2013 0841   BUN 18 07/12/2013 0841   CREATININE 0.46 03/05/2015 1324   CREATININE 0.64 05/22/2014 1111   CALCIUM 9.2 07/12/2013 0841   PROT 6.5 03/05/2015 1324   PROT 7.0 05/22/2014 1111   ALBUMIN 4.0 03/05/2015 1324   ALBUMIN 4.2 05/22/2014 1111   AST 23 03/05/2015 1324   AST 29 05/22/2014 1111   ALT 16 03/05/2015 1324   ALT 17 05/22/2014 1111   ALKPHOS 92 03/05/2015 1324   ALKPHOS 95 05/22/2014 1111   BILITOT 0.7 03/05/2015 1324   BILITOT 0.8 05/22/2014 1111   GFRNONAA >60 03/05/2015 1324   GFRNONAA >60 05/22/2014 1111   GFRNONAA >60 01/31/2014 1019   GFRAA >60 03/05/2015 1324   GFRAA >60 05/22/2014 1111   GFRAA >60 01/31/2014 1019    No results found for: SPEP, UPEP  Lab Results  Component Value Date   WBC 6.1 03/05/2015   NEUTROABS 4.5 03/05/2015   HGB 10.9* 03/05/2015   HCT 33.5* 03/05/2015   MCV 81.0 03/05/2015   PLT 218 03/05/2015      Chemistry      Component Value Date/Time   NA 137 07/12/2013 0841   K 4.7 07/12/2013 0841   CL 107 07/12/2013 0841   CO2 20* 07/12/2013 0841   BUN 18 07/12/2013 0841   CREATININE 0.46 03/05/2015 1324   CREATININE 0.64 05/22/2014 1111      Component Value Date/Time   CALCIUM 9.2 07/12/2013 0841   ALKPHOS 92 03/05/2015 1324   ALKPHOS 95 05/22/2014 1111   AST 23 03/05/2015 1324   AST 29 05/22/2014 1111   ALT 16 03/05/2015 1324   ALT 17 05/22/2014 1111   BILITOT 0.7 03/05/2015 1324   BILITOT 0.8 05/22/2014 1111       RADIOGRAPHIC STUDIES: I have personally reviewed the radiological images as listed and agreed with the findings in the report. No results found.   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  Clinically no evidence of progression noted at this time; octreotide scan-stable disease. Continue monthly octreotide.  # Iron deficiency anemia/Hx of AV malformation- hemoglobin 10.5/most recent ferritin approximately 2 months ago was 11. Start IV iron every 3 months or so.  # LEFT BREAST CA STAGE I- ER/PR POS: her -2 NEG- on Arimidex. Clinically no evidence of recurrence noted. Continue Arimidex calcium and vitamin D for now. Patient's recent mammogram May 2016 within normal limits.  # Allergies- recommend antihistamine.  # I reviewed the images myself reviewed the images with the patient in detail. She was given a copy of her scan.  # 25 minutes face-to-face with the patient discussing the above plan  of care; more than 50% of time spent on prognosis/ natural history; counseling and coordination.    [name], MD 03/05/2015 2:08 PM

## 2015-03-12 ENCOUNTER — Telehealth: Payer: Self-pay | Admitting: Internal Medicine

## 2015-03-12 ENCOUNTER — Inpatient Hospital Stay: Payer: Medicare Other

## 2015-03-12 ENCOUNTER — Inpatient Hospital Stay (HOSPITAL_BASED_OUTPATIENT_CLINIC_OR_DEPARTMENT_OTHER): Payer: Medicare Other | Admitting: Internal Medicine

## 2015-03-12 ENCOUNTER — Telehealth: Payer: Self-pay | Admitting: *Deleted

## 2015-03-12 ENCOUNTER — Other Ambulatory Visit: Payer: Self-pay | Admitting: Internal Medicine

## 2015-03-12 VITALS — BP 157/75 | HR 58 | Temp 97.4°F | Resp 18 | Ht 64.0 in | Wt 211.0 lb

## 2015-03-12 VITALS — BP 159/78 | HR 60 | Resp 18

## 2015-03-12 DIAGNOSIS — C7A00 Malignant carcinoid tumor of unspecified site: Secondary | ICD-10-CM

## 2015-03-12 DIAGNOSIS — R634 Abnormal weight loss: Secondary | ICD-10-CM

## 2015-03-12 DIAGNOSIS — Z170 Estrogen receptor positive status [ER+]: Secondary | ICD-10-CM

## 2015-03-12 DIAGNOSIS — K6389 Other specified diseases of intestine: Secondary | ICD-10-CM

## 2015-03-12 DIAGNOSIS — D509 Iron deficiency anemia, unspecified: Secondary | ICD-10-CM | POA: Diagnosis not present

## 2015-03-12 DIAGNOSIS — C7B02 Secondary carcinoid tumors of liver: Secondary | ICD-10-CM | POA: Diagnosis not present

## 2015-03-12 DIAGNOSIS — C50912 Malignant neoplasm of unspecified site of left female breast: Secondary | ICD-10-CM | POA: Diagnosis not present

## 2015-03-12 DIAGNOSIS — Z79899 Other long term (current) drug therapy: Secondary | ICD-10-CM

## 2015-03-12 DIAGNOSIS — Z79811 Long term (current) use of aromatase inhibitors: Secondary | ICD-10-CM

## 2015-03-12 MED ORDER — SODIUM CHLORIDE 0.9 % IV SOLN
INTRAVENOUS | Status: DC
  Administered 2015-03-12: 14:00:00 via INTRAVENOUS
  Filled 2015-03-12: qty 1000

## 2015-03-12 MED ORDER — IRON SUCROSE 20 MG/ML IV SOLN
200.0000 mg | Freq: Once | INTRAVENOUS | Status: AC
  Administered 2015-03-12: 200 mg via INTRAVENOUS
  Filled 2015-03-12: qty 10

## 2015-03-12 NOTE — Telephone Encounter (Signed)
Has lost 13 lbs in past week, states she is eating well. No other symptoms noted. Daughter is inquiring about her prognosis. Pt has appt today at 2 for injection. Please call daughter

## 2015-03-12 NOTE — Telephone Encounter (Signed)
Per Dr Rogue Bussing, need to see patient when she comes in today. I called [name] back and she will notify her mother to come in at 37 to see md

## 2015-03-12 NOTE — Progress Notes (Signed)
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  Patient denies any diarrhea. Her appetite is good. Denies any blood in stools or black stools.  Patient's appetite is good. Denies any weight loss. Denies any significant diarrhea. Patient denies any new lumps or bumps. She has episodes of night sweats/hot flashes.  REVIEW OF SYSTEMS:  A complete 10 point review of system is done which is negative except mentioned above/history of present illness.   PAST MEDICAL HISTORY :  Past Medical History  Diagnosis Date  . Breast cancer, left breast ([place] 06/19/2014  . 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    PAST SURGICAL HISTORY :   Past Surgical History  Procedure Laterality Date  . Breast lumpectomy with sentinel lymph node biopsy Left 2014    FAMILY HISTORY :   Family History  Problem Relation Age of Onset  . Breast cancer Mother 742 . Breast cancer Maternal Aunt 68    SOCIAL HISTORY:   Social History  Substance Use Topics  . Smoking status: Never Smoker   . Smokeless tobacco: Never Used  . Alcohol Use: Not on file     ALLERGIES:  is allergic to sulfa antibiotics.  MEDICATIONS:  Current Outpatient Prescriptions  Medication Sig Dispense Refill  . amLODipine (NORVASC) 5 MG tablet Take by mouth.    .Marland Kitchenanastrozole (ARIMIDEX) 1 MG tablet Take by mouth.    . Calcium-Vitamin D 600-200 MG-UNIT tablet Take by mouth.    . esomeprazole (NEXIUM) 40 MG capsule Take by mouth.    . fluticasone (FLONASE) 50 MCG/ACT nasal spray Place 1 spray into both nostrils 2 (two) times daily.  0  . HYDROcodone-acetaminophen (NORCO/VICODIN) 5-325 MG tablet Take by mouth.    .Marland Kitchenibuprofen (ADVIL,MOTRIN) 200 MG tablet Take by mouth.    . labetalol (NORMODYNE) 300 MG tablet TAKE 1 TABLET (300 MG TOTAL) BY MOUTH 2 (TWO) TIMES DAILY.    .Marland Kitchenlevothyroxine (SYNTHROID) 88 MCG tablet Take by mouth.    . losartan-hydrochlorothiazide (HYZAAR) 100-25 MG per tablet Take by mouth.    .Marland Kitchenoctreotide (SANDOSTATIN LAR) 30 MG injection Inject into the muscle.     No current facility-administered medications for this visit.    PHYSICAL EXAMINATION: ECOG PERFORMANCE STATUS: 0 - Asymptomatic  BP 157/75 mmHg  Pulse 58  Temp(Src) 97.4 F (36.3 C)  Resp 18  Ht 5' 4"  (1.626 m)  Wt 210 lb 15.7 oz (95.7 kg)  BMI 36.20 kg/m2  Filed Weights   03/12/15 1343  Weight: 210 lb 15.7 oz (95.7 kg)    GENERAL: Well-nourished well-developed; Alert, no distress and comfortable.  Alone; hard of hearing.   LABORATORY DATA:  I have reviewed the data as listed    Component Value Date/Time   NA 137 07/12/2013 0841   K 4.7 07/12/2013 0841   CL 107 07/12/2013 0841   CO2 20* 07/12/2013 0841   GLUCOSE 122* 07/12/2013 0841   BUN 18 07/12/2013 0841   CREATININE 0.46 03/05/2015 1324   CREATININE 0.64 05/22/2014 1111   CALCIUM 9.2 07/12/2013 0841   PROT 6.5 03/05/2015 1324   PROT 7.0 05/22/2014 1111   ALBUMIN 4.0 03/05/2015 1324   ALBUMIN 4.2 05/22/2014 1111   AST 23 03/05/2015 1324   AST 29 05/22/2014 1111   ALT 16 03/05/2015 1324   ALT 17 05/22/2014  1111   ALKPHOS 92 03/05/2015 1324   ALKPHOS 95 05/22/2014 1111   BILITOT 0.7 03/05/2015 1324   BILITOT 0.8 05/22/2014 1111   GFRNONAA >60 03/05/2015 1324   GFRNONAA >60 05/22/2014 1111   GFRNONAA >60 01/31/2014 1019   GFRAA >60 03/05/2015 1324   GFRAA >60 05/22/2014 1111   GFRAA >60 01/31/2014 1019    No results found for: SPEP, UPEP  Lab Results  Component Value Date   WBC 6.1 03/05/2015   NEUTROABS 4.5 03/05/2015   HGB 10.9* 03/05/2015   HCT 33.5* 03/05/2015   MCV 81.0 03/05/2015   PLT 218 03/05/2015      Chemistry      Component Value Date/Time   NA 137 07/12/2013 0841   K 4.7 07/12/2013 0841   CL 107 07/12/2013 0841   CO2 20* 07/12/2013 0841   BUN 18 07/12/2013 0841   CREATININE 0.46 03/05/2015 1324   CREATININE 0.64 05/22/2014 1111      Component Value Date/Time   CALCIUM 9.2 07/12/2013 0841   ALKPHOS 92 03/05/2015 1324   ALKPHOS 95 05/22/2014 1111   AST 23 03/05/2015 1324   AST 29 05/22/2014 1111   ALT 16 03/05/2015 1324   ALT 17 05/22/2014 1111   BILITOT 0.7 03/05/2015 1324   BILITOT 0.8 05/22/2014 1111       ASSESSMENT & PLAN:  # 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   # Clinically no evidence of progression noted at this time; feb 2017- octreotide scan-stable disease. Continue monthly octreotide.  # Iron deficiency anemia/Hx of AV malformation-hemoglobin 10.9. Ferritin is 11. Recommend IV iron starting today.   # LEFT BREAST CA STAGE I- ER/PR POS: her -2 NEG- on Arimidex. Clinically no evidence of recurrence noted. Continue Arimidex calcium and vitamin D for now. Patient's recent mammogram May 2016 within normal limits.  # Hot flashes likely from- aromatase inhibitor. Question weight loss- I reviewed the weight since August 2016- patient has gained about 6- 7 pounds; but not lost any weight. I left a message for the daughter over the phone.   Follow up  as planned.  # 15 minutes face-to-face with the patient discussing the above plan of care; more than 50% of time spent on prognosis/ natural history; counseling and coordination.    [name], MD 03/12/2015 1:59 PM

## 2015-03-12 NOTE — Telephone Encounter (Signed)
Per md, have patient came at 145pm today see md. I sent a msg to cancer center scheduling to arrange. The patient is scheduled at 2pm for iv iron today.

## 2015-03-12 NOTE — Progress Notes (Signed)
Patient's daughter called with concerns that patient has lost weight since her visit last week. Patient states that her weight goes and up and down all the time. She states that some nights  she wakes up with night sweats and is very sore the next couple of days.

## 2015-03-23 ENCOUNTER — Emergency Department
Admission: EM | Admit: 2015-03-23 | Discharge: 2015-03-23 | Disposition: A | Discharge status: Home / Self Care | Payer: Medicare Other | Attending: Emergency Medicine | Admitting: Emergency Medicine

## 2015-03-23 ENCOUNTER — Encounter: Payer: Self-pay | Admitting: Emergency Medicine

## 2015-03-23 ENCOUNTER — Emergency Department: Payer: Medicare Other

## 2015-03-23 DIAGNOSIS — Y9289 Other specified places as the place of occurrence of the external cause: Secondary | ICD-10-CM | POA: Insufficient documentation

## 2015-03-23 DIAGNOSIS — S6991XA Unspecified injury of right wrist, hand and finger(s), initial encounter: Secondary | ICD-10-CM | POA: Diagnosis present

## 2015-03-23 DIAGNOSIS — M1811 Unilateral primary osteoarthritis of first carpometacarpal joint, right hand: Secondary | ICD-10-CM | POA: Insufficient documentation

## 2015-03-23 DIAGNOSIS — M19019 Primary osteoarthritis, unspecified shoulder: Secondary | ICD-10-CM

## 2015-03-23 DIAGNOSIS — W01198A Fall on same level from slipping, tripping and stumbling with subsequent striking against other object, initial encounter: Secondary | ICD-10-CM | POA: Diagnosis not present

## 2015-03-23 DIAGNOSIS — Y9301 Activity, walking, marching and hiking: Secondary | ICD-10-CM | POA: Diagnosis not present

## 2015-03-23 DIAGNOSIS — Z791 Long term (current) use of non-steroidal anti-inflammatories (NSAID): Secondary | ICD-10-CM | POA: Diagnosis not present

## 2015-03-23 DIAGNOSIS — M19011 Primary osteoarthritis, right shoulder: Secondary | ICD-10-CM | POA: Diagnosis not present

## 2015-03-23 DIAGNOSIS — Y998 Other external cause status: Secondary | ICD-10-CM | POA: Insufficient documentation

## 2015-03-23 DIAGNOSIS — Z79899 Other long term (current) drug therapy: Secondary | ICD-10-CM | POA: Diagnosis not present

## 2015-03-23 DIAGNOSIS — M25511 Pain in right shoulder: Secondary | ICD-10-CM

## 2015-03-23 NOTE — ED Notes (Signed)
Fall.  Patient was working outside and fell.  Walking with cane that hit some loose leaves and cane slipped.  C/o right shoulder and upper back pain, and right hand.

## 2015-03-23 NOTE — ED Notes (Signed)
Pt reports slipped while walking with cane this evening. Pt landed on right hand and right shoulder. Rt shoulder appears lower then left and right hand is ecchymotic with good distal pulses. Pt reports no LOC, no blood thinners, and no head injury.

## 2015-03-23 NOTE — Discharge Instructions (Signed)
Cryotherapy Cryotherapy is when you put ice on your injury. Ice helps lessen pain and puffiness (swelling) after an injury. Ice works the best when you start using it in the first 24 to 48 hours after an injury. HOME CARE  Put a dry or damp towel between the ice pack and your skin.  You may press gently on the ice pack.  Leave the ice on for no more than 10 to 20 minutes at a time.  Check your skin after 5 minutes to make sure your skin is okay.  Rest at least 20 minutes between ice pack uses.  Stop using ice when your skin loses feeling (numbness).  Do not use ice on someone who cannot tell you when it hurts. This includes small children and people with memory problems (dementia). GET HELP RIGHT AWAY IF:  You have white spots on your skin.  Your skin turns blue or pale.  Your skin feels waxy or hard.  Your puffiness gets worse. MAKE SURE YOU:   Understand these instructions.  Will watch your condition.  Will get help right away if you are not doing well or get worse.   This information is not intended to replace advice given to you by your health care provider. Make sure you discuss any questions you have with your health care provider.   Document Released: 07/02/2007 Document Revised: 04/07/2011 Document Reviewed: 09/05/2010 Elsevier Interactive Patient Education 2016 [place]  Arthritis Arthritis means joint pain. It can also mean joint disease. A joint is a place where bones come together. People who have arthritis may have:  Red joints.  Swollen joints.  Stiff joints.  Warm joints.  A fever.  A feeling of being sick. HOME CARE Pay attention to any changes in your symptoms. Take these actions to help with your pain and swelling. Medicines  Take over-the-counter and prescription medicines only as told by your doctor.  Do not take aspirin for pain if your doctor says that you may have gout. Activities  Rest your joint if your doctor tells you  to.  Avoid activities that make the pain worse.  Exercise your joint regularly as told by your doctor. Try doing exercises like:  Swimming.  Water aerobics.  Biking.  Walking. Joint Care  If your joint is swollen, keep it raised (elevated) if told by your doctor.  If your joint feels stiff in the morning, try taking a warm shower.  If you have diabetes, do not apply heat without asking your doctor.  If told, apply heat to the joint:  Put a towel between the joint and the hot pack or heating pad.  Leave the heat on the area for 20-30 minutes.  If told, apply ice to the joint:  Put ice in a plastic bag.  Place a towel between your skin and the bag.  Leave the ice on for 20 minutes, 2-3 times per day.  Keep all follow-up visits as told by your doctor. GET HELP IF:  The pain gets worse.  You have a fever. GET HELP RIGHT AWAY IF:  You have very bad pain in your joint.  You have swelling in your joint.  Your joint is red.  Many joints become painful and swollen.  You have very bad back pain.  Your leg is very weak.  You cannot control your pee (urine) or poop (stool).   This information is not intended to replace advice given to you by your health care provider. Make sure you  discuss any questions you have with your health care provider.   Document Released: 04/09/2009 Document Revised: 10/04/2014 Document Reviewed: 04/10/2014 Elsevier Interactive Patient Education [employer].

## 2015-03-23 NOTE — ED Provider Notes (Signed)
CSN: [id number]     Arrival date & time 03/23/15  1818 History   First MD Initiated Contact with Patient 03/23/15 1931     Chief Complaint  Patient presents with  . Fall     (Consider location/radiation/quality/duration/timing/severity/associated sxs/prior Treatment) HPI  75 year old female presents to emergency department for evaluation of right shoulder and right hand pain. Patient states she was walking in the grass earlier today, tripped and fell landing on her right shoulder and upper extremity. She complains of pain to the right hand CMC joint and right shoulder acromioclavicular  joint. Pain is 7 out of 10. She denies any numbness or tingling. No other injuries to her body. She denies any head trauma, nausea, vomiting, vision changes, chest pain, shortness of breath. She has taken anti-inflammatory medications earlier today. She avoids narcotics for pain. She is ambulatory with a cane.  Past Medical History  Diagnosis Date  . Breast cancer, left breast ([place]) 06/19/2014  . 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   Past Surgical History  Procedure Laterality Date  . Breast lumpectomy with sentinel lymph node biopsy Left 2014   Family History  Problem Relation Age of Onset  . Breast cancer Mother 65  . Breast cancer Maternal Aunt 68   Social History  Substance Use Topics  . Smoking status: Never Smoker   . Smokeless tobacco: Never Used  . Alcohol Use: None   OB History    No data available     Review of Systems  Constitutional: Negative for fever, chills, activity change and fatigue.  HENT: Negative for congestion, sinus pressure and sore throat.   Eyes: Negative for visual disturbance.  Respiratory: Negative for cough, chest tightness and shortness of breath.   Cardiovascular: Negative for chest pain and leg swelling.  Gastrointestinal: Negative for nausea, vomiting, abdominal pain and diarrhea.  Genitourinary: Negative for dysuria.  Musculoskeletal: Positive for arthralgias.  Negative for gait problem.  Skin: Negative for rash.  Neurological: Negative for weakness, numbness and headaches.  Hematological: Negative for adenopathy.  Psychiatric/Behavioral: Negative for behavioral problems, confusion and agitation.      Allergies  Sulfa antibiotics  Home Medications   Prior to Admission medications   Medication Sig Start Date End Date Taking? Authorizing Provider  amLODipine (NORVASC) 5 MG tablet Take by mouth. 05/24/14 05/24/15  Historical Provider, MD  anastrozole (ARIMIDEX) 1 MG tablet Take by mouth.    Historical Provider, MD  Calcium-Vitamin D 600-200 MG-UNIT tablet Take by mouth.    Historical Provider, MD  esomeprazole (NEXIUM) 40 MG capsule Take by mouth.    Historical Provider, MD  fluticasone (FLONASE) 50 MCG/ACT nasal spray Place 1 spray into both nostrils 2 (two) times daily. 12/01/14   Historical Provider, MD  HYDROcodone-acetaminophen (NORCO/VICODIN) 5-325 MG tablet Take by mouth. 08/10/14   Historical Provider, MD  ibuprofen (ADVIL,MOTRIN) 200 MG tablet Take by mouth.    Historical Provider, MD  labetalol (NORMODYNE) 300 MG tablet TAKE 1 TABLET (300 MG TOTAL) BY MOUTH 2 (TWO) TIMES DAILY. 08/09/14   Historical Provider, MD  levothyroxine (SYNTHROID) 88 MCG tablet Take by mouth. 06/20/14   Historical Provider, MD  losartan-hydrochlorothiazide (HYZAAR) 100-25 MG per tablet Take by mouth. 12/20/13   Historical Provider, MD  octreotide (SANDOSTATIN LAR) 30 MG injection Inject into the muscle.    Historical Provider, MD   BP 208/85 mmHg  Pulse 58  Temp(Src) 97.7 F (36.5 C) (Oral)  Ht 5\' 3"  (1.6 m)  Wt 95.255 kg  BMI 37.21 kg/m2  SpO2  99% Physical Exam  Constitutional: She is oriented to person, place, and time. She appears well-developed and well-nourished. No distress.  HENT:  Head: Normocephalic and atraumatic.  Mouth/Throat: Oropharynx is clear and moist.  Eyes: EOM are normal. Pupils are equal, round, and reactive to light. Right eye exhibits  no discharge. Left eye exhibits no discharge.  Neck: Normal range of motion. Neck supple.  Cardiovascular: Normal rate, regular rhythm and intact distal pulses.   Pulmonary/Chest: Effort normal and breath sounds normal. No respiratory distress. She exhibits no tenderness.  Abdominal: Soft. She exhibits no distension. There is no tenderness.  Musculoskeletal:  Examination of the cervical spine shows patient has no tenderness along the spinous process, she has full range of motion. Examination of the right shoulder shows patient is tender over the before meals joint and subacromial space. She has good abduction to 90. She has a negative drop arm test. She has pain with external rotation. She is minimally tender along the proximal humerus. 5 out of 5 strength with biceps, triceps strength. Full composite fist with grip strength 5 out of 5. Mild ecchymosis along the right first [place] joint. She is tenderness over the [facility] joint. She has a mildly positive CMC grind test. There is no tendon deficits throughout the digits.  Neurological: She is alert and oriented to person, place, and time. She has normal reflexes.  Skin: Skin is warm and dry.  Psychiatric: She has a normal mood and affect. Her behavior is normal. Thought content normal.    ED Course  Procedures (including critical care time) Labs Review Labs Reviewed - No data to display  Imaging Review Dg Shoulder Right  03/23/2015  CLINICAL DATA:  75 year old who slipped and fell while walking with a cane earlier this evening, landing on the right shoulder and right hand. Initial encounter. EXAM: RIGHT SHOULDER - 2+ VIEW COMPARISON:  None. FINDINGS: No evidence of acute fracture or glenohumeral dislocation. Narrowing of the subacromial space. Acromioclavicular joint intact with moderate degenerative changes. Osseous demineralization. IMPRESSION: No acute osseous abnormality. Narrowing of the subacromial space may indicate chronic supraspinatus tendon  disease. Moderate degenerative changes involving the AC joint. Electronically Signed   By: Evangeline Dakin M.D.   On: 03/23/2015 20:04   Dg Hand Complete Right  03/23/2015  CLINICAL DATA:  Slip and fall while walking with a cane this evening, landing on right hand and shoulder. Now with right hand pain and bruising. EXAM: RIGHT HAND - COMPLETE 3+ VIEW COMPARISON:  None. FINDINGS: No fracture or dislocation. Multifocal osteoarthritis involving the base of the thumb at the carpometacarpal joint and throughout the digits. There is mild soft tissue edema. IMPRESSION: Osteoarthritis throughout the hand without acute fracture or subluxation. Electronically Signed   By: Jeb Levering M.D.   On: 03/23/2015 20:04   I have personally reviewed and evaluated these images and lab results as part of my medical decision-making.   EKG Interpretation None      MDM   Final diagnoses:  Right shoulder pain  Osteoarthritis of glenohumeral joint, right  Osteoarthritis of acromioclavicular joint  Primary osteoarthritis of first carpometacarpal joint of right hand    75 year old female with a fall earlier today. She denies any other injuries to her body except for right shoulder and right hand pain. She is ambulatory upon arrival. X-rays show no fracture of the shoulder or hand, she was found to have osteoarthritis of the acromioclavicular joint, glenohumeral joint, first [place] joint of the right upper extremity. She'll  continue with ibuprofen as needed for pain. She is placed into a sling today for comfort. She'll wear sling for 2-3 days then transition out. Follow-up with orthopedics.    Duanne Guess, PA-C 03/23/15 2059  Carrie Mew, MD 03/23/15 [phone]

## 2015-04-02 ENCOUNTER — Inpatient Hospital Stay: Payer: Medicare Other | Attending: Internal Medicine

## 2015-04-02 ENCOUNTER — Inpatient Hospital Stay: Payer: Medicare Other

## 2015-04-02 DIAGNOSIS — C50912 Malignant neoplasm of unspecified site of left female breast: Secondary | ICD-10-CM | POA: Insufficient documentation

## 2015-04-02 DIAGNOSIS — Z170 Estrogen receptor positive status [ER+]: Secondary | ICD-10-CM | POA: Insufficient documentation

## 2015-04-02 DIAGNOSIS — Z79899 Other long term (current) drug therapy: Secondary | ICD-10-CM | POA: Insufficient documentation

## 2015-04-02 DIAGNOSIS — K6389 Other specified diseases of intestine: Secondary | ICD-10-CM

## 2015-04-02 DIAGNOSIS — Z79811 Long term (current) use of aromatase inhibitors: Secondary | ICD-10-CM | POA: Diagnosis not present

## 2015-04-02 DIAGNOSIS — D509 Iron deficiency anemia, unspecified: Secondary | ICD-10-CM | POA: Insufficient documentation

## 2015-04-02 DIAGNOSIS — C7B02 Secondary carcinoid tumors of liver: Secondary | ICD-10-CM | POA: Diagnosis not present

## 2015-04-02 DIAGNOSIS — C7A010 Malignant carcinoid tumor of the duodenum: Secondary | ICD-10-CM

## 2015-04-02 LAB — COMPREHENSIVE METABOLIC PANEL
ALK PHOS: 86 U/L (ref 38–126)
ALT: 15 U/L (ref 14–54)
AST: 20 U/L (ref 15–41)
Albumin: 4 g/dL (ref 3.5–5.0)
Anion gap: 6 (ref 5–15)
BILIRUBIN TOTAL: 0.6 mg/dL (ref 0.3–1.2)
BUN: 18 mg/dL (ref 6–20)
CALCIUM: 8.6 mg/dL — AB (ref 8.9–10.3)
CO2: 26 mmol/L (ref 22–32)
CREATININE: 0.56 mg/dL (ref 0.44–1.00)
Chloride: 103 mmol/L (ref 101–111)
Glucose, Bld: 102 mg/dL — ABNORMAL HIGH (ref 65–99)
Potassium: 3.7 mmol/L (ref 3.5–5.1)
Sodium: 135 mmol/L (ref 135–145)
Total Protein: 6.9 g/dL (ref 6.5–8.1)

## 2015-04-02 LAB — CBC WITH DIFFERENTIAL/PLATELET
Basophils Absolute: 0 10*3/uL (ref 0–0.1)
Basophils Relative: 1 %
EOS PCT: 4 %
Eosinophils Absolute: 0.2 10*3/uL (ref 0–0.7)
HEMATOCRIT: 33.9 % — AB (ref 35.0–47.0)
HEMOGLOBIN: 11 g/dL — AB (ref 12.0–16.0)
LYMPHS ABS: 0.5 10*3/uL — AB (ref 1.0–3.6)
LYMPHS PCT: 8 %
MCH: 25.7 pg — AB (ref 26.0–34.0)
MCHC: 32.4 g/dL (ref 32.0–36.0)
MCV: 79.3 fL — AB (ref 80.0–100.0)
Monocytes Absolute: 0.8 10*3/uL (ref 0.2–0.9)
Monocytes Relative: 13 %
NEUTROS ABS: 4.7 10*3/uL (ref 1.4–6.5)
NEUTROS PCT: 74 %
Platelets: 208 10*3/uL (ref 150–440)
RBC: 4.28 MIL/uL (ref 3.80–5.20)
RDW: 17 % — ABNORMAL HIGH (ref 11.5–14.5)
WBC: 6.3 10*3/uL (ref 3.6–11.0)

## 2015-04-02 MED ORDER — OCTREOTIDE ACETATE 20 MG IM KIT
20.0000 mg | PACK | Freq: Once | INTRAMUSCULAR | Status: AC
  Administered 2015-04-02: 20 mg via INTRAMUSCULAR
  Filled 2015-04-02: qty 1

## 2015-04-03 LAB — CHROMOGRANIN A: CHROMOGRANIN A: 4 nmol/L (ref 0–5)

## 2015-04-30 ENCOUNTER — Inpatient Hospital Stay: Payer: Medicare Other

## 2015-04-30 ENCOUNTER — Inpatient Hospital Stay: Payer: Medicare Other | Attending: Internal Medicine

## 2015-04-30 ENCOUNTER — Inpatient Hospital Stay (HOSPITAL_BASED_OUTPATIENT_CLINIC_OR_DEPARTMENT_OTHER): Payer: Medicare Other | Admitting: Internal Medicine

## 2015-04-30 VITALS — BP 184/79 | HR 56 | Temp 98.1°F | Wt 210.5 lb

## 2015-04-30 DIAGNOSIS — Z79811 Long term (current) use of aromatase inhibitors: Secondary | ICD-10-CM | POA: Diagnosis not present

## 2015-04-30 DIAGNOSIS — K6389 Other specified diseases of intestine: Secondary | ICD-10-CM

## 2015-04-30 DIAGNOSIS — C7A010 Malignant carcinoid tumor of the duodenum: Secondary | ICD-10-CM

## 2015-04-30 DIAGNOSIS — C7A019 Malignant carcinoid tumor of the small intestine, unspecified portion: Secondary | ICD-10-CM

## 2015-04-30 DIAGNOSIS — R1909 Other intra-abdominal and pelvic swelling, mass and lump: Secondary | ICD-10-CM

## 2015-04-30 DIAGNOSIS — Z170 Estrogen receptor positive status [ER+]: Secondary | ICD-10-CM | POA: Insufficient documentation

## 2015-04-30 DIAGNOSIS — Z79899 Other long term (current) drug therapy: Secondary | ICD-10-CM

## 2015-04-30 DIAGNOSIS — E041 Nontoxic single thyroid nodule: Secondary | ICD-10-CM | POA: Diagnosis not present

## 2015-04-30 DIAGNOSIS — D509 Iron deficiency anemia, unspecified: Secondary | ICD-10-CM

## 2015-04-30 DIAGNOSIS — Z803 Family history of malignant neoplasm of breast: Secondary | ICD-10-CM

## 2015-04-30 DIAGNOSIS — C50912 Malignant neoplasm of unspecified site of left female breast: Secondary | ICD-10-CM | POA: Diagnosis not present

## 2015-04-30 DIAGNOSIS — H9190 Unspecified hearing loss, unspecified ear: Secondary | ICD-10-CM

## 2015-04-30 LAB — COMPREHENSIVE METABOLIC PANEL
ALBUMIN: 4.1 g/dL (ref 3.5–5.0)
ALT: 14 U/L (ref 14–54)
AST: 19 U/L (ref 15–41)
Alkaline Phosphatase: 91 U/L (ref 38–126)
Anion gap: 5 (ref 5–15)
BUN: 19 mg/dL (ref 6–20)
CHLORIDE: 106 mmol/L (ref 101–111)
CO2: 25 mmol/L (ref 22–32)
CREATININE: 0.61 mg/dL (ref 0.44–1.00)
Calcium: 8.9 mg/dL (ref 8.9–10.3)
GFR calc Af Amer: >60 mL/min (ref >60)
GLUCOSE: 99 mg/dL (ref 65–99)
POTASSIUM: 4 mmol/L (ref 3.5–5.1)
Sodium: 136 mmol/L (ref 135–145)
Total Bilirubin: 0.7 mg/dL (ref 0.3–1.2)
Total Protein: 6.8 g/dL (ref 6.5–8.1)

## 2015-04-30 LAB — FERRITIN: FERRITIN: 9 ng/mL — AB (ref 11–307)

## 2015-04-30 LAB — CBC WITH DIFFERENTIAL/PLATELET
BASOS ABS: 0.1 10*3/uL (ref 0–0.1)
BASOS PCT: 1 %
EOS PCT: 4 %
Eosinophils Absolute: 0.3 10*3/uL (ref 0–0.7)
HEMATOCRIT: 32.5 % — AB (ref 35.0–47.0)
Hemoglobin: 10.5 g/dL — ABNORMAL LOW (ref 12.0–16.0)
LYMPHS ABS: 0.7 10*3/uL — AB (ref 1.0–3.6)
LYMPHS PCT: 9 %
MCH: 24.3 pg — ABNORMAL LOW (ref 26.0–34.0)
MCHC: 32.4 g/dL (ref 32.0–36.0)
MCV: 74.9 fL — AB (ref 80.0–100.0)
MONO ABS: 1 10*3/uL — AB (ref 0.2–0.9)
MONOS PCT: 13 %
NEUTROS ABS: 5.4 10*3/uL (ref 1.4–6.5)
NEUTROS PCT: 73 %
PLATELETS: 240 10*3/uL (ref 150–440)
RBC: 4.34 MIL/uL (ref 3.80–5.20)
RDW: 17 % — AB (ref 11.5–14.5)
WBC: 7.4 10*3/uL (ref 3.6–11.0)

## 2015-04-30 LAB — IRON AND TIBC
IRON: 16 ug/dL — AB (ref 28–170)
Saturation Ratios: 3 % — ABNORMAL LOW (ref 10.4–31.8)
TIBC: 507 ug/dL — ABNORMAL HIGH (ref 250–450)
UIBC: 491 ug/dL

## 2015-04-30 MED ORDER — OCTREOTIDE ACETATE 20 MG IM KIT
20.0000 mg | PACK | Freq: Once | INTRAMUSCULAR | Status: AC
  Administered 2015-04-30: 20 mg via INTRAMUSCULAR
  Filled 2015-04-30: qty 1

## 2015-04-30 NOTE — Progress Notes (Signed)
Patient recently had Kenalog 80 mg injections in bilateral knees.

## 2015-04-30 NOTE — Progress Notes (Signed)
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  Patient recently had a mechanical fall; no syncopal episodes. No nausea no vomiting. No abdominal pain. No diarrhea. No weight loss.   Her appetite is good. Denies any blood in stools or black stools.   REVIEW OF SYSTEMS:  A complete 10 point review of system is done which is negative except mentioned above/history of present illness.   PAST MEDICAL HISTORY :  Past Medical History  Diagnosis Date  . Breast cancer, left breast ([place] 06/19/2014  . 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    PAST SURGICAL HISTORY :   Past Surgical History  Procedure Laterality Date  . Breast lumpectomy with sentinel lymph node biopsy Left 2014    FAMILY HISTORY :   Family History  Problem Relation Age of Onset  . Breast cancer Mother 767 . Breast cancer Maternal Aunt 68    SOCIAL HISTORY:   Social History  Substance Use Topics  . Smoking status: Never Smoker   . Smokeless tobacco: Never Used  . Alcohol Use: Not on file    ALLERGIES:  is allergic to sulfa antibiotics.  MEDICATIONS:  Current Outpatient Prescriptions  Medication Sig  Dispense Refill  . amLODipine (NORVASC) 5 MG tablet Take by mouth.    .Marland Kitchenanastrozole (ARIMIDEX) 1 MG tablet Take by mouth.    . Calcium-Vitamin D 600-200 MG-UNIT tablet Take by mouth.    . esomeprazole (NEXIUM) 40 MG capsule Take by mouth.    . fluticasone (FLONASE) 50 MCG/ACT nasal spray Place 1 spray into both nostrils 2 (two) times daily.  0  . HYDROcodone-acetaminophen (NORCO/VICODIN) 5-325 MG tablet Take by mouth.    .Marland Kitchenibuprofen (ADVIL,MOTRIN) 200 MG tablet Take by mouth.    . labetalol (NORMODYNE) 300 MG tablet TAKE 1 TABLET (300 MG TOTAL) BY MOUTH 2 (TWO) TIMES DAILY.    .Marland Kitchenlevothyroxine (SYNTHROID) 88 MCG tablet Take by mouth.    . losartan-hydrochlorothiazide (HYZAAR) 100-25 MG per tablet Take by mouth.    .Marland Kitchenoctreotide (SANDOSTATIN LAR) 30 MG injection Inject into the muscle.     No current facility-administered medications for this visit.    PHYSICAL EXAMINATION: ECOG PERFORMANCE STATUS: 0 - Asymptomatic  BP 184/79 mmHg  Pulse 56  Temp(Src) 98.1 F (36.7 C) (Tympanic)  Wt 210 lb 8.6 oz (95.5 kg)  Filed Weights   04/30/15 1436  Weight: 210 lb 8.6 oz (95.5 kg)   GENERAL: Well-nourished well-developed; Alert, no distress and comfortable. Alone; hard of hearing. Walks with a cane.  EYES: no pallor or icterus OROPHARYNX: no thrush or ulceration; good dentition  NECK: supple, no masses felt LYMPH: no palpable lymphadenopathy in the cervical, axillary or  inguinal regions LUNGS: clear to auscultation and No wheeze or crackles HEART/CVS: regular rate & rhythm and no murmurs; No lower extremity edema ABDOMEN:abdomen soft, non-tender and normal bowel sounds Musculoskeletal:no cyanosis of digits and no clubbing  PSYCH: alert & oriented x 3 with fluent speech NEURO: no focal motor/sensory deficits SKIN: no rashes or significant lesions  LABORATORY DATA:  I have reviewed the data as listed    Component Value Date/Time   NA 136 04/30/2015 1409   NA 137 07/12/2013 0841    K 4.0 04/30/2015 1409   K 4.7 07/12/2013 0841   CL 106 04/30/2015 1409   CL 107 07/12/2013 0841   CO2 25 04/30/2015 1409   CO2 20* 07/12/2013 0841   GLUCOSE 99 04/30/2015 1409   GLUCOSE 122* 07/12/2013 0841   BUN 19 04/30/2015 1409   BUN 18 07/12/2013 0841   CREATININE 0.61 04/30/2015 1409   CREATININE 0.64 05/22/2014 1111   CALCIUM 8.9 04/30/2015 1409   CALCIUM 9.2 07/12/2013 0841   PROT 6.8 04/30/2015 1409   PROT 7.0 05/22/2014 1111   ALBUMIN 4.1 04/30/2015 1409   ALBUMIN 4.2 05/22/2014 1111   AST 19 04/30/2015 1409   AST 29 05/22/2014 1111   ALT 14 04/30/2015 1409   ALT 17 05/22/2014 1111   ALKPHOS 91 04/30/2015 1409   ALKPHOS 95 05/22/2014 1111   BILITOT 0.7 04/30/2015 1409   BILITOT 0.8 05/22/2014 1111   GFRNONAA >60 04/30/2015 1409   GFRNONAA >60 05/22/2014 1111   GFRNONAA >60 01/31/2014 1019   GFRAA >60 04/30/2015 1409   GFRAA >60 05/22/2014 1111   GFRAA >60 01/31/2014 1019    No results found for: SPEP, UPEP  Lab Results  Component Value Date   WBC 7.4 04/30/2015   NEUTROABS 5.4 04/30/2015   HGB 10.5* 04/30/2015   HCT 32.5* 04/30/2015   MCV 74.9* 04/30/2015   PLT 240 04/30/2015      Chemistry      Component Value Date/Time   NA 136 04/30/2015 1409   NA 137 07/12/2013 0841   K 4.0 04/30/2015 1409   K 4.7 07/12/2013 0841   CL 106 04/30/2015 1409   CL 107 07/12/2013 0841   CO2 25 04/30/2015 1409   CO2 20* 07/12/2013 0841   BUN 19 04/30/2015 1409   BUN 18 07/12/2013 0841   CREATININE 0.61 04/30/2015 1409   CREATININE 0.64 05/22/2014 1111      Component Value Date/Time   CALCIUM 8.9 04/30/2015 1409   CALCIUM 9.2 07/12/2013 0841   ALKPHOS 91 04/30/2015 1409   ALKPHOS 95 05/22/2014 1111   AST 19 04/30/2015 1409   AST 29 05/22/2014 1111   ALT 14 04/30/2015 1409   ALT 17 05/22/2014 1111   BILITOT 0.7 04/30/2015 1409   BILITOT 0.8 05/22/2014 1111       ASSESSMENT & PLAN:  # 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 Octreotide scan from the 2017 no evidence of progression. Continue monthlyOctreotide.  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 Received Venofer 2 months ago.  However I think it's not enough. Recommend Feraheme weekly x 2. Orders in.   # LEFT BREAST CA STAGE I- ER/PR POS: her -2 NEG- on Arimidex. Clinically no evidence of recurrence noted. Continue Arimidex calcium and vitamin D for now. Patient's recent mammogram May 2016 within normal limits.Hot flashes likely from- aromatase inhibitor- Monitor for now.  #  Follow-up In 4  weeks octreotide/possible IV iron/labs on the same day.    [name], MD 04/30/2015 2:50 PM

## 2015-05-01 ENCOUNTER — Other Ambulatory Visit: Payer: Self-pay | Admitting: Internal Medicine

## 2015-05-04 ENCOUNTER — Inpatient Hospital Stay: Payer: Medicare Other

## 2015-05-04 VITALS — BP 148/71 | HR 57 | Temp 96.9°F

## 2015-05-04 DIAGNOSIS — C7A019 Malignant carcinoid tumor of the small intestine, unspecified portion: Secondary | ICD-10-CM | POA: Diagnosis not present

## 2015-05-04 DIAGNOSIS — K6389 Other specified diseases of intestine: Secondary | ICD-10-CM

## 2015-05-04 MED ORDER — SODIUM CHLORIDE 0.9 % IV SOLN
510.0000 mg | Freq: Once | INTRAVENOUS | Status: AC
  Administered 2015-05-04: 510 mg via INTRAVENOUS
  Filled 2015-05-04: qty 17

## 2015-05-04 MED ORDER — SODIUM CHLORIDE 0.9 % IV SOLN
INTRAVENOUS | Status: DC
  Administered 2015-05-04: 12:00:00 via INTRAVENOUS
  Filled 2015-05-04: qty 1000

## 2015-05-11 ENCOUNTER — Inpatient Hospital Stay: Payer: Medicare Other

## 2015-05-11 VITALS — BP 150/75 | HR 52 | Temp 97.0°F

## 2015-05-11 DIAGNOSIS — K6389 Other specified diseases of intestine: Secondary | ICD-10-CM

## 2015-05-11 DIAGNOSIS — C7A019 Malignant carcinoid tumor of the small intestine, unspecified portion: Secondary | ICD-10-CM | POA: Diagnosis not present

## 2015-05-11 MED ORDER — SODIUM CHLORIDE 0.9 % IV SOLN
INTRAVENOUS | Status: DC
  Administered 2015-05-11: 14:00:00 via INTRAVENOUS
  Filled 2015-05-11: qty 1000

## 2015-05-11 MED ORDER — SODIUM CHLORIDE 0.9 % IV SOLN
510.0000 mg | Freq: Once | INTRAVENOUS | Status: AC
  Administered 2015-05-11: 510 mg via INTRAVENOUS
  Filled 2015-05-11: qty 17

## 2015-05-24 ENCOUNTER — Other Ambulatory Visit: Payer: Self-pay | Admitting: Surgery

## 2015-05-24 DIAGNOSIS — Z853 Personal history of malignant neoplasm of breast: Secondary | ICD-10-CM

## 2015-05-28 ENCOUNTER — Inpatient Hospital Stay (HOSPITAL_BASED_OUTPATIENT_CLINIC_OR_DEPARTMENT_OTHER): Payer: Medicare Other | Admitting: Internal Medicine

## 2015-05-28 ENCOUNTER — Inpatient Hospital Stay: Payer: Medicare Other | Attending: Internal Medicine

## 2015-05-28 ENCOUNTER — Telehealth: Payer: Self-pay | Admitting: Internal Medicine

## 2015-05-28 ENCOUNTER — Inpatient Hospital Stay: Payer: Medicare Other

## 2015-05-28 VITALS — BP 185/74 | HR 54 | Temp 96.5°F | Resp 18 | Wt 206.1 lb

## 2015-05-28 DIAGNOSIS — K6389 Other specified diseases of intestine: Secondary | ICD-10-CM

## 2015-05-28 DIAGNOSIS — M2550 Pain in unspecified joint: Secondary | ICD-10-CM | POA: Diagnosis not present

## 2015-05-28 DIAGNOSIS — T451X5S Adverse effect of antineoplastic and immunosuppressive drugs, sequela: Secondary | ICD-10-CM | POA: Diagnosis not present

## 2015-05-28 DIAGNOSIS — Z79811 Long term (current) use of aromatase inhibitors: Secondary | ICD-10-CM

## 2015-05-28 DIAGNOSIS — R1909 Other intra-abdominal and pelvic swelling, mass and lump: Secondary | ICD-10-CM | POA: Diagnosis not present

## 2015-05-28 DIAGNOSIS — Z79899 Other long term (current) drug therapy: Secondary | ICD-10-CM | POA: Insufficient documentation

## 2015-05-28 DIAGNOSIS — C7A019 Malignant carcinoid tumor of the small intestine, unspecified portion: Secondary | ICD-10-CM

## 2015-05-28 DIAGNOSIS — H9190 Unspecified hearing loss, unspecified ear: Secondary | ICD-10-CM | POA: Insufficient documentation

## 2015-05-28 DIAGNOSIS — D509 Iron deficiency anemia, unspecified: Secondary | ICD-10-CM

## 2015-05-28 DIAGNOSIS — E041 Nontoxic single thyroid nodule: Secondary | ICD-10-CM | POA: Insufficient documentation

## 2015-05-28 DIAGNOSIS — C50912 Malignant neoplasm of unspecified site of left female breast: Secondary | ICD-10-CM | POA: Diagnosis not present

## 2015-05-28 DIAGNOSIS — Z170 Estrogen receptor positive status [ER+]: Secondary | ICD-10-CM | POA: Insufficient documentation

## 2015-05-28 LAB — COMPREHENSIVE METABOLIC PANEL
ALK PHOS: 79 U/L (ref 38–126)
ALT: 16 U/L (ref 14–54)
AST: 27 U/L (ref 15–41)
Albumin: 4.3 g/dL (ref 3.5–5.0)
Anion gap: 7 (ref 5–15)
BUN: 19 mg/dL (ref 6–20)
CALCIUM: 9.3 mg/dL (ref 8.9–10.3)
CHLORIDE: 108 mmol/L (ref 101–111)
CO2: 26 mmol/L (ref 22–32)
CREATININE: 0.58 mg/dL (ref 0.44–1.00)
GFR calc non Af Amer: >60 mL/min (ref >60)
GLUCOSE: 111 mg/dL — AB (ref 65–99)
Potassium: 3.8 mmol/L (ref 3.5–5.1)
SODIUM: 141 mmol/L (ref 135–145)
Total Bilirubin: 0.9 mg/dL (ref 0.3–1.2)
Total Protein: 6.7 g/dL (ref 6.5–8.1)

## 2015-05-28 LAB — CBC WITH DIFFERENTIAL/PLATELET
BASOS ABS: 0.1 10*3/uL (ref 0–0.1)
Basophils Relative: 1 %
EOS ABS: 0.2 10*3/uL (ref 0–0.7)
EOS PCT: 3 %
HCT: 37.2 % (ref 35.0–47.0)
HEMOGLOBIN: 12.3 g/dL (ref 12.0–16.0)
LYMPHS ABS: 0.7 10*3/uL — AB (ref 1.0–3.6)
LYMPHS PCT: 11 %
MCH: 26.5 pg (ref 26.0–34.0)
MCHC: 33 g/dL (ref 32.0–36.0)
MCV: 80.4 fL (ref 80.0–100.0)
Monocytes Absolute: 1 10*3/uL — ABNORMAL HIGH (ref 0.2–0.9)
Monocytes Relative: 15 %
NEUTROS PCT: 70 %
Neutro Abs: 4.5 10*3/uL (ref 1.4–6.5)
PLATELETS: 178 10*3/uL (ref 150–440)
RBC: 4.63 MIL/uL (ref 3.80–5.20)
RDW: 26.2 % — ABNORMAL HIGH (ref 11.5–14.5)
WBC: 6.5 10*3/uL (ref 3.6–11.0)

## 2015-05-28 MED ORDER — OCTREOTIDE ACETATE 20 MG IM KIT
20.0000 mg | PACK | Freq: Once | INTRAMUSCULAR | Status: AC
Start: 2015-05-28 — End: 2015-05-28
  Administered 2015-05-28: 20 mg via INTRAMUSCULAR
  Filled 2015-05-28: qty 1

## 2015-05-28 MED ORDER — ERGOCALCIFEROL 1.25 MG (50000 UT) PO CAPS: 50000.0000 [IU] | ORAL_CAPSULE | ORAL | Status: DC

## 2015-05-28 NOTE — Progress Notes (Signed)
Patient state there are times she aches "to the bone" and feels sore all over.

## 2015-05-28 NOTE — Telephone Encounter (Signed)
Called patient and left message that she should hold her Arimidex for four weeks.  Advised her to call back if she has questions.

## 2015-05-28 NOTE — Telephone Encounter (Signed)
Patient called to get schedule and asked if the nurse can call her and tell her again what the plan is for taking or not taking her anastrazole. Thanks.

## 2015-05-28 NOTE — Progress Notes (Signed)
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 She received IV iron in April 2017.  She complains of diffuse joint pains bodyaches muscle pain for the last many months. However it got worse in the last few weeks.  She denies any blood in stools black stools.  No nausea no vomiting. No abdominal pain. No diarrhea. No weight loss.  REVIEW OF SYSTEMS:  A complete 10 point review of system is done which is negative except mentioned above/history of present illness.   PAST MEDICAL HISTORY :  Past Medical History  Diagnosis Date  . Breast cancer, left breast ([place]) 06/19/2014  . 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    PAST SURGICAL HISTORY :   Past Surgical History  Procedure Laterality Date  . Breast lumpectomy with sentinel lymph node biopsy Left 2014    FAMILY HISTORY :   Family History  Problem Relation Age of Onset  . Breast cancer Mother 30  . Breast cancer Maternal Aunt 68    SOCIAL HISTORY:   Social History  Substance Use Topics  . Smoking status: Never Smoker   . Smokeless tobacco: Never Used  . Alcohol Use: Not on file     ALLERGIES:  is allergic to sulfa antibiotics.  MEDICATIONS:  Current Outpatient Prescriptions  Medication Sig Dispense Refill  . amLODipine (NORVASC) 5 MG tablet Take 5 mg by mouth.    Marland Kitchen anastrozole (ARIMIDEX) 1 MG tablet Take by mouth.    . Calcium-Vitamin D 600-200 MG-UNIT tablet Take by mouth.    . esomeprazole (NEXIUM) 40 MG capsule Take by mouth.    . fluticasone (FLONASE) 50 MCG/ACT nasal spray Place 1 spray into both nostrils 2 (two) times daily.  0  . HYDROcodone-acetaminophen (NORCO/VICODIN) 5-325 MG tablet Take by mouth.    Marland Kitchen ibuprofen (ADVIL,MOTRIN) 200 MG tablet Take by mouth.    . labetalol (NORMODYNE) 300 MG tablet TAKE 1 TABLET (300 MG TOTAL) BY MOUTH 2 (TWO) TIMES DAILY.    Marland Kitchen levothyroxine (SYNTHROID) 88 MCG tablet Take by mouth.    . losartan-hydrochlorothiazide (HYZAAR) 100-25 MG per tablet Take by mouth.    Marland Kitchen octreotide (SANDOSTATIN LAR) 30 MG injection Inject into the muscle.    Marland Kitchen amLODipine (NORVASC) 5 MG tablet Take by mouth.     No current facility-administered medications for this visit.   Facility-Administered Medications Ordered in Other Visits  Medication Dose Route Frequency Provider Last Rate Last Dose  . octreotide (SANDOSTATIN LAR) IM injection 20 mg  20 mg Intramuscular Once [name], MD        PHYSICAL EXAMINATION: ECOG PERFORMANCE STATUS: 0 - Asymptomatic  BP 185/74 mmHg  Pulse 54  Temp(Src) 96.5  F (35.8 C) (Tympanic)  Resp 18  Wt 206 lb 2.1 oz (93.5 kg)  Filed Weights   05/28/15 1049  Weight: 206 lb 2.1 oz (93.5 kg)   GENERAL: Well-nourished well-developed; Alert, no distress and comfortable. Alone; hard of hearing. Walks with a cane.  EYES: no pallor or icterus OROPHARYNX: no thrush or ulceration; good dentition  NECK: supple, no masses felt LYMPH: no palpable lymphadenopathy in the cervical, axillary or inguinal regions LUNGS: clear to auscultation and No wheeze or crackles HEART/CVS: regular rate & rhythm and no  murmurs; No lower extremity edema ABDOMEN:abdomen soft, non-tender and normal bowel sounds Musculoskeletal:no cyanosis of digits and no clubbing  PSYCH: alert & oriented x 3 with fluent speech NEURO: no focal motor/sensory deficits SKIN: no rashes or significant lesions  LABORATORY DATA:  I have reviewed the data as listed    Component Value Date/Time   NA 141 05/28/2015 1013   NA 137 07/12/2013 0841   K 3.8 05/28/2015 1013   K 4.7 07/12/2013 0841   CL 108 05/28/2015 1013   CL 107 07/12/2013 0841   CO2 26 05/28/2015 1013   CO2 20* 07/12/2013 0841   GLUCOSE 111* 05/28/2015 1013   GLUCOSE 122* 07/12/2013 0841   BUN 19 05/28/2015 1013   BUN 18 07/12/2013 0841   CREATININE 0.58 05/28/2015 1013   CREATININE 0.64 05/22/2014 1111   CALCIUM 9.3 05/28/2015 1013   CALCIUM 9.2 07/12/2013 0841   PROT 6.7 05/28/2015 1013   PROT 7.0 05/22/2014 1111   ALBUMIN 4.3 05/28/2015 1013   ALBUMIN 4.2 05/22/2014 1111   AST 27 05/28/2015 1013   AST 29 05/22/2014 1111   ALT 16 05/28/2015 1013   ALT 17 05/22/2014 1111   ALKPHOS 79 05/28/2015 1013   ALKPHOS 95 05/22/2014 1111   BILITOT 0.9 05/28/2015 1013   BILITOT 0.8 05/22/2014 1111   GFRNONAA >60 05/28/2015 1013   GFRNONAA >60 05/22/2014 1111   GFRNONAA >60 01/31/2014 1019   GFRAA >60 05/28/2015 1013   GFRAA >60 05/22/2014 1111   GFRAA >60 01/31/2014 1019    No results found for: SPEP, UPEP  Lab Results  Component Value Date   WBC 6.5 05/28/2015   NEUTROABS 4.5 05/28/2015   HGB 12.3 05/28/2015   HCT 37.2 05/28/2015   MCV 80.4 05/28/2015   PLT 178 05/28/2015      Chemistry      Component Value Date/Time   NA 141 05/28/2015 1013   NA 137 07/12/2013 0841   K 3.8 05/28/2015 1013   K 4.7 07/12/2013 0841   CL 108 05/28/2015 1013   CL 107 07/12/2013 0841   CO2 26 05/28/2015 1013   CO2 20* 07/12/2013 0841   BUN 19 05/28/2015 1013   BUN 18 07/12/2013 0841   CREATININE 0.58 05/28/2015 1013   CREATININE 0.64 05/22/2014 1111       Component Value Date/Time   CALCIUM 9.3 05/28/2015 1013   CALCIUM 9.2 07/12/2013 0841   ALKPHOS 79 05/28/2015 1013   ALKPHOS 95 05/22/2014 1111   AST 27 05/28/2015 1013   AST 29 05/22/2014 1111   ALT 16 05/28/2015 1013   ALT 17 05/22/2014 1111   BILITOT 0.9 05/28/2015 1013   BILITOT 0.8 05/22/2014 1111       ASSESSMENT & PLAN:  # 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 Octreotide scan from Feb 2017 no evidence of progression. Continue monthly Octreotide.  #  Iron deficiency anemia/Hx of AV malformation-hemoglobin 10.5. Status post Feraheme April 2017- today hemoglobin is 10.5.   # LEFT BREAST CA STAGE I- ER/PR POS: her -2 NEG- on Arimidex. Clinically no evidence of recurrence noted. Muscle aches and body aches [C discussion below].   # Myalgias/joint pains- question related to Arimidex. HOLD Arimidex for 4 weeks. Also start the patient on high-dose vitamin D.  #  Follow-up In 4 weeks octreotide/possible IV iron/labs on the same day.    [name], MD 05/28/2015 11:43 AM

## 2015-06-15 ENCOUNTER — Ambulatory Visit
Admission: RE | Admit: 2015-06-15 | Discharge: 2015-06-15 | Disposition: A | Discharge status: Home / Self Care | Payer: Medicare Other | Source: Ambulatory Visit | Attending: Surgery | Admitting: Surgery

## 2015-06-15 DIAGNOSIS — Z853 Personal history of malignant neoplasm of breast: Secondary | ICD-10-CM | POA: Insufficient documentation

## 2015-06-27 ENCOUNTER — Other Ambulatory Visit: Payer: Medicare Other

## 2015-06-27 ENCOUNTER — Ambulatory Visit: Payer: Medicare Other | Admitting: Internal Medicine

## 2015-06-27 ENCOUNTER — Ambulatory Visit: Payer: Medicare Other

## 2015-06-28 ENCOUNTER — Inpatient Hospital Stay: Payer: Medicare Other | Attending: Internal Medicine

## 2015-06-28 ENCOUNTER — Encounter: Payer: Self-pay | Admitting: Internal Medicine

## 2015-06-28 ENCOUNTER — Inpatient Hospital Stay (HOSPITAL_BASED_OUTPATIENT_CLINIC_OR_DEPARTMENT_OTHER): Payer: Medicare Other | Admitting: Internal Medicine

## 2015-06-28 ENCOUNTER — Inpatient Hospital Stay: Payer: Medicare Other

## 2015-06-28 VITALS — BP 158/75 | HR 59 | Temp 98.2°F | Resp 20 | Ht 65.0 in | Wt 208.0 lb

## 2015-06-28 DIAGNOSIS — Z79811 Long term (current) use of aromatase inhibitors: Secondary | ICD-10-CM

## 2015-06-28 DIAGNOSIS — Z79899 Other long term (current) drug therapy: Secondary | ICD-10-CM | POA: Diagnosis not present

## 2015-06-28 DIAGNOSIS — C7A019 Malignant carcinoid tumor of the small intestine, unspecified portion: Secondary | ICD-10-CM | POA: Insufficient documentation

## 2015-06-28 DIAGNOSIS — C50912 Malignant neoplasm of unspecified site of left female breast: Secondary | ICD-10-CM | POA: Insufficient documentation

## 2015-06-28 DIAGNOSIS — K6389 Other specified diseases of intestine: Secondary | ICD-10-CM

## 2015-06-28 DIAGNOSIS — D509 Iron deficiency anemia, unspecified: Secondary | ICD-10-CM | POA: Diagnosis not present

## 2015-06-28 DIAGNOSIS — M2550 Pain in unspecified joint: Secondary | ICD-10-CM | POA: Insufficient documentation

## 2015-06-28 DIAGNOSIS — Z170 Estrogen receptor positive status [ER+]: Secondary | ICD-10-CM

## 2015-06-28 DIAGNOSIS — C7A00 Malignant carcinoid tumor of unspecified site: Secondary | ICD-10-CM

## 2015-06-28 LAB — CBC WITH DIFFERENTIAL/PLATELET
BASOS ABS: 0 10*3/uL (ref 0–0.1)
BASOS PCT: 1 %
EOS ABS: 0.3 10*3/uL (ref 0–0.7)
EOS PCT: 5 %
HCT: 39.6 % (ref 35.0–47.0)
Hemoglobin: 13.3 g/dL (ref 12.0–16.0)
Lymphocytes Relative: 8 %
Lymphs Abs: 0.5 10*3/uL — ABNORMAL LOW (ref 1.0–3.6)
MCH: 28.2 pg (ref 26.0–34.0)
MCHC: 33.6 g/dL (ref 32.0–36.0)
MCV: 84 fL (ref 80.0–100.0)
MONO ABS: 0.8 10*3/uL (ref 0.2–0.9)
Monocytes Relative: 12 %
Neutro Abs: 4.9 10*3/uL (ref 1.4–6.5)
Neutrophils Relative %: 74 %
PLATELETS: 177 10*3/uL (ref 150–440)
RBC: 4.71 MIL/uL (ref 3.80–5.20)
RDW: 24.4 % — AB (ref 11.5–14.5)
WBC: 6.6 10*3/uL (ref 3.6–11.0)

## 2015-06-28 LAB — BASIC METABOLIC PANEL
ANION GAP: 6 (ref 5–15)
BUN: 20 mg/dL (ref 6–20)
CALCIUM: 9.2 mg/dL (ref 8.9–10.3)
CO2: 24 mmol/L (ref 22–32)
CREATININE: 0.58 mg/dL (ref 0.44–1.00)
Chloride: 107 mmol/L (ref 101–111)
Glucose, Bld: 107 mg/dL — ABNORMAL HIGH (ref 65–99)
Potassium: 4.1 mmol/L (ref 3.5–5.1)
Sodium: 137 mmol/L (ref 135–145)

## 2015-06-28 LAB — IRON AND TIBC
IRON: 57 ug/dL (ref 28–170)
Saturation Ratios: 15 % (ref 10.4–31.8)
TIBC: 391 ug/dL (ref 250–450)
UIBC: 334 ug/dL

## 2015-06-28 LAB — FERRITIN: Ferritin: 35 ng/mL (ref 11–307)

## 2015-06-28 MED ORDER — OCTREOTIDE ACETATE 20 MG IM KIT
20.0000 mg | PACK | Freq: Once | INTRAMUSCULAR | Status: AC
  Administered 2015-06-28: 20 mg via INTRAMUSCULAR

## 2015-06-28 NOTE — Progress Notes (Signed)
Patient here for follow up

## 2015-06-28 NOTE — Progress Notes (Signed)
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   Patient has not noted significant improvement of her body aches and joint pains even after stopping Arimidex for approximately a month.  She is currently on  Vitamin D 50,000 units every week.  She denies any blood in stools black stools.  No nausea no vomiting. No abdominal pain. No diarrhea. No weight loss.  REVIEW OF SYSTEMS:  A complete 10 point review of system is done which is negative except mentioned above/history of present illness.   PAST MEDICAL HISTORY :  Past Medical History  Diagnosis Date  . Breast cancer, left breast ([place]) 06/19/2014  . 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    PAST SURGICAL HISTORY :   Past Surgical History  Procedure Laterality Date  . Breast lumpectomy with sentinel lymph node biopsy Left 2014  . Breast excisional biopsy Left 12/2012    +    FAMILY HISTORY :   Family History  Problem Relation Age of Onset  . Breast cancer Mother 33  . Breast cancer Maternal Aunt 68    SOCIAL HISTORY:   Social History  Substance Use Topics  . Smoking status: Never Smoker    . Smokeless tobacco: Never Used  . Alcohol Use: None    ALLERGIES:  is allergic to sulfa antibiotics.  MEDICATIONS:  Current Outpatient Prescriptions  Medication Sig Dispense Refill  . amLODipine (NORVASC) 5 MG tablet Take 5 mg by mouth.    . Calcium-Vitamin D 600-200 MG-UNIT tablet Take by mouth.    . ergocalciferol (VITAMIN D2) 50000 units capsule Take 1 capsule (50,000 Units total) by mouth once a week. 12 capsule 1  . esomeprazole (NEXIUM) 40 MG capsule Take by mouth.    . fluticasone (FLONASE) 50 MCG/ACT nasal spray Place 1 spray into both nostrils 2 (two) times daily.  0  . HYDROcodone-acetaminophen (NORCO/VICODIN) 5-325 MG tablet Take by mouth.    Marland Kitchen ibuprofen (ADVIL,MOTRIN) 200 MG tablet Take by mouth.    . labetalol (NORMODYNE) 300 MG tablet TAKE 1 TABLET (300 MG TOTAL) BY MOUTH 2 (TWO) TIMES DAILY.    Marland Kitchen levothyroxine (SYNTHROID) 88 MCG tablet Take by mouth.    . losartan-hydrochlorothiazide (HYZAAR) 100-25 MG per tablet Take by mouth.    Marland Kitchen octreotide (SANDOSTATIN LAR) 30 MG injection Inject into the muscle.    Marland Kitchen amLODipine (NORVASC) 5 MG tablet Take by mouth.    . diazepam (VALIUM) 5 MG tablet Take by mouth.    . Vitamin D, Ergocalciferol, (DRISDOL) 50000 units CAPS capsule Take by mouth.     No current facility-administered medications for this visit.    PHYSICAL EXAMINATION: ECOG PERFORMANCE STATUS: 0 - Asymptomatic  BP 158/75 mmHg  Pulse 59  Temp(Src) 98.2 F (36.8 C) (Oral)  Ht _0  (1.651 m)  Wt 208 lb (94.348 kg)  BMI 34.61 kg/m2  Filed Weights   06/28/15 1504  Weight: 208 lb (94.348 kg)   GENERAL: Well-nourished well-developed; Alert, no distress and comfortable. Alone; hard of hearing. Walks with a cane.  EYES: no pallor or icterus OROPHARYNX: no thrush or ulceration; good dentition  NECK: supple, no masses felt LYMPH: no palpable lymphadenopathy in the cervical, axillary or inguinal regions LUNGS: clear to auscultation and No wheeze or  crackles HEART/CVS: regular rate & rhythm and no murmurs; No lower extremity edema ABDOMEN:abdomen soft, non-tender and normal bowel sounds Musculoskeletal:no cyanosis of digits and no clubbing  PSYCH: alert & oriented x 3 with fluent speech NEURO: no focal motor/sensory deficits SKIN: no rashes or significant lesions  LABORATORY DATA:  I have reviewed the data as listed    Component Value Date/Time   NA 137 06/28/2015 1435   NA 137 07/12/2013 0841   K 4.1 06/28/2015 1435   K 4.7 07/12/2013 0841   CL 107 06/28/2015 1435   CL 107 07/12/2013 0841   CO2 24 06/28/2015 1435   CO2 20* 07/12/2013 0841   GLUCOSE 107* 06/28/2015 1435   GLUCOSE 122* 07/12/2013 0841   BUN 20 06/28/2015 1435   BUN 18 07/12/2013 0841   CREATININE 0.58 06/28/2015 1435   CREATININE 0.64 05/22/2014 1111   CALCIUM 9.2 06/28/2015 1435   CALCIUM 9.2 07/12/2013 0841   PROT 6.7 05/28/2015 1013   PROT 7.0 05/22/2014 1111   ALBUMIN 4.3 05/28/2015 1013   ALBUMIN 4.2 05/22/2014 1111   AST 27 05/28/2015 1013   AST 29 05/22/2014 1111   ALT 16 05/28/2015 1013   ALT 17 05/22/2014 1111   ALKPHOS 79 05/28/2015 1013   ALKPHOS 95 05/22/2014 1111   BILITOT 0.9 05/28/2015 1013   BILITOT 0.8 05/22/2014 1111   GFRNONAA >60 06/28/2015 1435   GFRNONAA >60 05/22/2014 1111   GFRNONAA >60 01/31/2014 1019   GFRAA >60 06/28/2015 1435   GFRAA >60 05/22/2014 1111   GFRAA >60 01/31/2014 1019    No results found for: SPEP, UPEP  Lab Results  Component Value Date   WBC 6.6 06/28/2015   NEUTROABS 4.9 06/28/2015   HGB 13.3 06/28/2015   HCT 39.6 06/28/2015   MCV 84.0 06/28/2015   PLT 177 06/28/2015      Chemistry      Component Value Date/Time   NA 137 06/28/2015 1435   NA 137 07/12/2013 0841   K 4.1 06/28/2015 1435   K 4.7 07/12/2013 0841   CL 107 06/28/2015 1435   CL 107 07/12/2013 0841   CO2 24 06/28/2015 1435   CO2 20* 07/12/2013 0841   BUN 20 06/28/2015 1435   BUN 18 07/12/2013 0841   CREATININE 0.58  06/28/2015 1435   CREATININE 0.64 05/22/2014 1111      Component Value Date/Time   CALCIUM 9.2 06/28/2015 1435   CALCIUM 9.2 07/12/2013 0841   ALKPHOS 79 05/28/2015 1013   ALKPHOS 95 05/22/2014 1111   AST 27 05/28/2015 1013   AST 29 05/22/2014 1111   ALT 16 05/28/2015 1013   ALT 17 05/22/2014 1111   BILITOT 0.9 05/28/2015 1013   BILITOT 0.8 05/22/2014 1111       ASSESSMENT & PLAN:  # 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 Octreotide scan  from Feb 2017 no evidence of progression. Continue monthly Octreotide.  Repeat  Octreotide scan in 2 months.   # Iron deficiency anemia/Hx of AV malformation- Status post Feraheme April 2017- today hemoglobin is 13.  # LEFT BREAST CA STAGE I;  Restart Arimidex.  Clinically no evidence of recurrence.  # Myalgias/joint pains-  No significant improvement after holding Arimidex. Continue vitamin D every week  #  Follow-up with me in approximately 2 months;  CBC BMP monthly.  Scans prior to revisit.    [name], MD 06/28/2015 3:16 PM

## 2015-07-28 DIAGNOSIS — I82409 Acute embolism and thrombosis of unspecified deep veins of unspecified lower extremity: Secondary | ICD-10-CM

## 2015-07-28 HISTORY — DX: Acute embolism and thrombosis of unspecified deep veins of unspecified lower extremity: I82.409

## 2015-08-02 ENCOUNTER — Other Ambulatory Visit: Payer: Self-pay

## 2015-08-02 ENCOUNTER — Inpatient Hospital Stay: Payer: Medicare Other

## 2015-08-02 ENCOUNTER — Inpatient Hospital Stay: Payer: Medicare Other | Attending: Internal Medicine

## 2015-08-02 DIAGNOSIS — Z170 Estrogen receptor positive status [ER+]: Secondary | ICD-10-CM | POA: Diagnosis not present

## 2015-08-02 DIAGNOSIS — C7A019 Malignant carcinoid tumor of the small intestine, unspecified portion: Secondary | ICD-10-CM | POA: Insufficient documentation

## 2015-08-02 DIAGNOSIS — D509 Iron deficiency anemia, unspecified: Secondary | ICD-10-CM | POA: Insufficient documentation

## 2015-08-02 DIAGNOSIS — Z79899 Other long term (current) drug therapy: Secondary | ICD-10-CM | POA: Diagnosis not present

## 2015-08-02 DIAGNOSIS — Z79811 Long term (current) use of aromatase inhibitors: Secondary | ICD-10-CM | POA: Insufficient documentation

## 2015-08-02 DIAGNOSIS — K6389 Other specified diseases of intestine: Secondary | ICD-10-CM

## 2015-08-02 DIAGNOSIS — C50912 Malignant neoplasm of unspecified site of left female breast: Secondary | ICD-10-CM | POA: Insufficient documentation

## 2015-08-02 DIAGNOSIS — C7A00 Malignant carcinoid tumor of unspecified site: Secondary | ICD-10-CM

## 2015-08-02 LAB — CBC WITH DIFFERENTIAL/PLATELET
BASOS PCT: 1 %
Basophils Absolute: 0 10*3/uL (ref 0–0.1)
EOS ABS: 0.3 10*3/uL (ref 0–0.7)
Eosinophils Relative: 5 %
HCT: 38.4 % (ref 35.0–47.0)
HEMOGLOBIN: 13 g/dL (ref 12.0–16.0)
Lymphocytes Relative: 8 %
Lymphs Abs: 0.6 10*3/uL — ABNORMAL LOW (ref 1.0–3.6)
MCH: 29.1 pg (ref 26.0–34.0)
MCHC: 34 g/dL (ref 32.0–36.0)
MCV: 85.7 fL (ref 80.0–100.0)
Monocytes Absolute: 0.8 10*3/uL (ref 0.2–0.9)
Monocytes Relative: 12 %
NEUTROS PCT: 74 %
Neutro Abs: 5.2 10*3/uL (ref 1.4–6.5)
Platelets: 172 10*3/uL (ref 150–440)
RBC: 4.48 MIL/uL (ref 3.80–5.20)
RDW: 19.6 % — ABNORMAL HIGH (ref 11.5–14.5)
WBC: 7 10*3/uL (ref 3.6–11.0)

## 2015-08-02 LAB — COMPREHENSIVE METABOLIC PANEL
ALBUMIN: 4 g/dL (ref 3.5–5.0)
ALK PHOS: 80 U/L (ref 38–126)
ALT: 17 U/L (ref 14–54)
ANION GAP: 5 (ref 5–15)
AST: 23 U/L (ref 15–41)
BUN: 23 mg/dL — ABNORMAL HIGH (ref 6–20)
CALCIUM: 9.4 mg/dL (ref 8.9–10.3)
CO2: 25 mmol/L (ref 22–32)
CREATININE: 0.55 mg/dL (ref 0.44–1.00)
Chloride: 108 mmol/L (ref 101–111)
GFR calc Af Amer: >60 mL/min (ref >60)
GFR calc non Af Amer: >60 mL/min (ref >60)
GLUCOSE: 112 mg/dL — AB (ref 65–99)
Potassium: 3.7 mmol/L (ref 3.5–5.1)
SODIUM: 138 mmol/L (ref 135–145)
Total Bilirubin: 0.6 mg/dL (ref 0.3–1.2)
Total Protein: 6.9 g/dL (ref 6.5–8.1)

## 2015-08-02 MED ORDER — OCTREOTIDE ACETATE 20 MG IM KIT
20.0000 mg | PACK | Freq: Once | INTRAMUSCULAR | Status: AC
  Administered 2015-08-02: 20 mg via INTRAMUSCULAR
  Filled 2015-08-02: qty 1

## 2015-08-25 ENCOUNTER — Other Ambulatory Visit: Payer: Self-pay

## 2015-08-25 ENCOUNTER — Emergency Department: Payer: Medicare Other

## 2015-08-25 ENCOUNTER — Encounter: Payer: Self-pay | Admitting: *Deleted

## 2015-08-25 ENCOUNTER — Observation Stay
Admission: EM | Admit: 2015-08-25 | Discharge: 2015-08-27 | Disposition: A | Discharge status: Home / Self Care | Payer: Medicare Other | Attending: Internal Medicine | Admitting: Internal Medicine

## 2015-08-25 DIAGNOSIS — Z791 Long term (current) use of non-steroidal anti-inflammatories (NSAID): Secondary | ICD-10-CM | POA: Insufficient documentation

## 2015-08-25 DIAGNOSIS — Z9049 Acquired absence of other specified parts of digestive tract: Secondary | ICD-10-CM | POA: Insufficient documentation

## 2015-08-25 DIAGNOSIS — Z79899 Other long term (current) drug therapy: Secondary | ICD-10-CM | POA: Diagnosis not present

## 2015-08-25 DIAGNOSIS — N390 Urinary tract infection, site not specified: Secondary | ICD-10-CM | POA: Diagnosis present

## 2015-08-25 DIAGNOSIS — Z853 Personal history of malignant neoplasm of breast: Secondary | ICD-10-CM | POA: Diagnosis not present

## 2015-08-25 DIAGNOSIS — W1830XA Fall on same level, unspecified, initial encounter: Secondary | ICD-10-CM | POA: Insufficient documentation

## 2015-08-25 DIAGNOSIS — I10 Essential (primary) hypertension: Secondary | ICD-10-CM | POA: Diagnosis not present

## 2015-08-25 DIAGNOSIS — Z9012 Acquired absence of left breast and nipple: Secondary | ICD-10-CM | POA: Insufficient documentation

## 2015-08-25 DIAGNOSIS — Z803 Family history of malignant neoplasm of breast: Secondary | ICD-10-CM | POA: Insufficient documentation

## 2015-08-25 DIAGNOSIS — E039 Hypothyroidism, unspecified: Secondary | ICD-10-CM | POA: Diagnosis not present

## 2015-08-25 DIAGNOSIS — K219 Gastro-esophageal reflux disease without esophagitis: Secondary | ICD-10-CM | POA: Insufficient documentation

## 2015-08-25 DIAGNOSIS — F419 Anxiety disorder, unspecified: Secondary | ICD-10-CM | POA: Diagnosis not present

## 2015-08-25 DIAGNOSIS — B9620 Unspecified Escherichia coli [E. coli] as the cause of diseases classified elsewhere: Secondary | ICD-10-CM | POA: Diagnosis not present

## 2015-08-25 DIAGNOSIS — M79605 Pain in left leg: Secondary | ICD-10-CM | POA: Diagnosis present

## 2015-08-25 DIAGNOSIS — S0101XA Laceration without foreign body of scalp, initial encounter: Secondary | ICD-10-CM | POA: Diagnosis not present

## 2015-08-25 DIAGNOSIS — Z882 Allergy status to sulfonamides status: Secondary | ICD-10-CM | POA: Insufficient documentation

## 2015-08-25 DIAGNOSIS — Z90710 Acquired absence of both cervix and uterus: Secondary | ICD-10-CM | POA: Insufficient documentation

## 2015-08-25 DIAGNOSIS — S82102A Unspecified fracture of upper end of left tibia, initial encounter for closed fracture: Secondary | ICD-10-CM

## 2015-08-25 DIAGNOSIS — M25552 Pain in left hip: Secondary | ICD-10-CM

## 2015-08-25 DIAGNOSIS — S82132A Displaced fracture of medial condyle of left tibia, initial encounter for closed fracture: Secondary | ICD-10-CM | POA: Diagnosis not present

## 2015-08-25 DIAGNOSIS — M85872 Other specified disorders of bone density and structure, left ankle and foot: Secondary | ICD-10-CM | POA: Insufficient documentation

## 2015-08-25 DIAGNOSIS — M25462 Effusion, left knee: Secondary | ICD-10-CM | POA: Diagnosis not present

## 2015-08-25 DIAGNOSIS — S82202A Unspecified fracture of shaft of left tibia, initial encounter for closed fracture: Secondary | ICD-10-CM | POA: Diagnosis present

## 2015-08-25 DIAGNOSIS — W19XXXA Unspecified fall, initial encounter: Secondary | ICD-10-CM | POA: Diagnosis present

## 2015-08-25 HISTORY — DX: Gastro-esophageal reflux disease without esophagitis: K21.9

## 2015-08-25 HISTORY — DX: Disorder of thyroid, unspecified: E07.9

## 2015-08-25 HISTORY — DX: Essential (primary) hypertension: I10

## 2015-08-25 LAB — COMPREHENSIVE METABOLIC PANEL
ALBUMIN: 4 g/dL (ref 3.5–5.0)
ALK PHOS: 94 U/L (ref 38–126)
ALT: 19 U/L (ref 14–54)
ANION GAP: 6 (ref 5–15)
AST: 26 U/L (ref 15–41)
BUN: 20 mg/dL (ref 6–20)
CALCIUM: 9.3 mg/dL (ref 8.9–10.3)
CO2: 27 mmol/L (ref 22–32)
Chloride: 107 mmol/L (ref 101–111)
Creatinine, Ser: 0.56 mg/dL (ref 0.44–1.00)
GFR calc non Af Amer: >60 mL/min (ref >60)
Glucose, Bld: 109 mg/dL — ABNORMAL HIGH (ref 65–99)
POTASSIUM: 3.8 mmol/L (ref 3.5–5.1)
SODIUM: 140 mmol/L (ref 135–145)
Total Bilirubin: 1.1 mg/dL (ref 0.3–1.2)
Total Protein: 7 g/dL (ref 6.5–8.1)

## 2015-08-25 LAB — URINALYSIS COMPLETE WITH MICROSCOPIC (ARMC ONLY)
Bilirubin Urine: NEGATIVE
Glucose, UA: NEGATIVE mg/dL
NITRITE: POSITIVE — AB
PH: 5 (ref 5.0–8.0)
PROTEIN: NEGATIVE mg/dL
SPECIFIC GRAVITY, URINE: 1.01 (ref 1.005–1.030)
Squamous Epithelial / LPF: NONE SEEN

## 2015-08-25 LAB — CBC WITH DIFFERENTIAL/PLATELET
BASOS PCT: 1 %
Basophils Absolute: 0.1 10*3/uL (ref 0–0.1)
EOS ABS: 0.2 10*3/uL (ref 0–0.7)
Eosinophils Relative: 2 %
HEMATOCRIT: 38.3 % (ref 35.0–47.0)
HEMOGLOBIN: 13 g/dL (ref 12.0–16.0)
LYMPHS ABS: 0.5 10*3/uL — AB (ref 1.0–3.6)
Lymphocytes Relative: 5 %
MCH: 30 pg (ref 26.0–34.0)
MCHC: 34 g/dL (ref 32.0–36.0)
MCV: 88.3 fL (ref 80.0–100.0)
Monocytes Absolute: 0.8 10*3/uL (ref 0.2–0.9)
Monocytes Relative: 8 %
NEUTROS ABS: 8.6 10*3/uL — AB (ref 1.4–6.5)
NEUTROS PCT: 84 %
Platelets: 179 10*3/uL (ref 150–440)
RBC: 4.34 MIL/uL (ref 3.80–5.20)
RDW: 13.9 % (ref 11.5–14.5)
WBC: 10.3 10*3/uL (ref 3.6–11.0)

## 2015-08-25 LAB — TROPONIN I: Troponin I: <0.03 ng/mL (ref <0.03)

## 2015-08-25 MED ORDER — OXYCODONE-ACETAMINOPHEN 5-325 MG PO TABS
1.0000 | ORAL_TABLET | Freq: Once | ORAL | Status: AC
  Administered 2015-08-25: 1 via ORAL
  Filled 2015-08-25: qty 1

## 2015-08-25 MED ORDER — CEPHALEXIN 500 MG PO CAPS: 500.0000 mg | ORAL_CAPSULE | Freq: Two times a day (BID) | ORAL | 0 refills | Status: DC

## 2015-08-25 MED ORDER — MORPHINE SULFATE (PF) 2 MG/ML IV SOLN
2.0000 mg | Freq: Once | INTRAVENOUS | Status: AC
  Administered 2015-08-26: 2 mg via INTRAVENOUS
  Filled 2015-08-25: qty 1

## 2015-08-25 MED ORDER — CEPHALEXIN 500 MG PO CAPS
500.0000 mg | ORAL_CAPSULE | Freq: Once | ORAL | Status: AC
  Administered 2015-08-25: 500 mg via ORAL
  Filled 2015-08-25: qty 1

## 2015-08-25 MED ORDER — CEFTRIAXONE SODIUM 1 G IJ SOLR: 1.0000 g | INTRAMUSCULAR | Status: DC

## 2015-08-25 MED ORDER — TETANUS-DIPHTH-ACELL PERTUSSIS 5-2.5-18.5 LF-MCG/0.5 IM SUSP
0.5000 mL | Freq: Once | INTRAMUSCULAR | Status: AC
  Administered 2015-08-25: 0.5 mL via INTRAMUSCULAR
  Filled 2015-08-25: qty 0.5

## 2015-08-25 NOTE — ED Triage Notes (Signed)
Per EMS report, patient's granddaughter was helping the patient from the car when her knee gave way and patient fell. Patient has contusion on right temple from hitting the sideview mirror, has small lac on back of the head from hitting the ground. Patient c/o right leg/hip pain, head pain. Patient is alert, talkative, hard of hearing. Patient is currently being seen at [facility] for cancer treatments.

## 2015-08-25 NOTE — ED Notes (Signed)
Patient left for imaging. 

## 2015-08-25 NOTE — ED Provider Notes (Signed)
-----------------------------------------   9:20 PM on 08/25/2015 -----------------------------------------   Blood pressure (!) 187/90, pulse 66, temperature 97.7 F (36.5 C), temperature source Oral, resp. rate 18, height 5\' 2"  (1.575 m), weight 97.2 kg, SpO2 98 %.  Assuming care from Dr. Reita Cliche.  In short, Emma [name] is a 75 y.o. female with a chief complaint of Fall .  Refer to the original H&P for additional details.  The current plan of care is to follow up CT hip and plain films and reassess.  At this point the patient and family prefer to go home.  She also requires treatment for UTI.    (Note that documentation was delayed due to multiple ED patients requiring immediate care.)   The patient's CT hip was unremarkable.  She does have a nondisplaced fracture of her left proximal tibia.  I spoke by phone with the on-call orthopedic surgeon, Dr. hasty, who recommended a knee immobilizer and nonweightbearing status on that leg.  He did not feel that surgery would be required.  However, the patient feels completely incapable of moving her leg and has pain that is not controlled with oral medication.  I spoke with the patient and daughter and then with the hospitalist and we will admit her for pain management, PT/OT evaluation, and possible placement in rehabilitation.   Hinda Kehr, MD 08/26/15 0030

## 2015-08-25 NOTE — H&P (Signed)
[facility] NAME: Emma [name]    MR#:  [id number]  DATE OF BIRTH:  Feb 24, 1940  DATE OF ADMISSION:  08/25/2015  PRIMARY CARE PHYSICIAN: [name], MD   REQUESTING/REFERRING PHYSICIAN: Karma Greaser, MD  CHIEF COMPLAINT:   Chief Complaint  Patient presents with  . Fall    HISTORY OF PRESENT ILLNESS:  Emma [name]  is a 75 y.o. female who presents with A fall and subsequent left tibial fracture. Imaging confirmed proximal incomplete left tibial fracture.  Orthopedics was called and recommended an immobilizer and nonweightbearing with outpatient follow-up. Over, the patient afterwards had very difficult to control pain. She was unable to ambulate with assistive devices, and persistently complained of pain despite treatment. At that point hospitalists were called for admission.  PAST MEDICAL HISTORY:   Past Medical History:  Diagnosis Date  . Breast cancer, left breast ([place]) 06/19/2014  . GERD (gastroesophageal reflux disease)   . Hypertension   . 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 Thyroid disease     PAST SURGICAL HISTORY:   Past Surgical History:  Procedure Laterality Date  . ABDOMINAL HYSTERECTOMY     partial  . BREAST EXCISIONAL BIOPSY Left 12/2012   +  . BREAST LUMPECTOMY WITH SENTINEL LYMPH NODE BIOPSY Left 2014  . CHOLECYSTECTOMY      SOCIAL HISTORY:   Social History  Substance Use Topics  . Smoking status: Never Smoker  . Smokeless tobacco: Never Used  . Alcohol use No    FAMILY HISTORY:   Family History  Problem Relation Age of Onset  . Breast cancer Mother 23  . Breast cancer Maternal Aunt 68    DRUG ALLERGIES:   Allergies  Allergen Reactions  . Sulfa Antibiotics     Other reaction(s): Kidney Disorder    MEDICATIONS AT HOME:   Prior to Admission medications   Medication Sig Start Date End Date Taking? Authorizing Provider  anastrozole (ARIMIDEX) 1 MG tablet Take 1 mg by mouth daily.    Yes Historical Provider, MD  ergocalciferol (VITAMIN D2) 50000 units capsule Take 1 capsule (50,000 Units total) by mouth once a week. 05/28/15  Yes [name], MD  ibuprofen (ADVIL,MOTRIN) 200 MG tablet Take 200 mg by mouth every 6 (six) hours as needed for headache or mild pain.    Yes Historical Provider, MD  labetalol (NORMODYNE) 300 MG tablet TAKE 1 TABLET (300 MG TOTAL) BY MOUTH 2 (TWO) TIMES DAILY. 08/09/14  Yes Historical Provider, MD  levothyroxine (SYNTHROID) 88 MCG tablet Take 88 mcg by mouth daily before breakfast.  06/20/14  Yes Historical Provider, MD  octreotide (SANDOSTATIN LAR) 30 MG injection Inject 30 mg into the muscle every 28 (twenty-eight) days.    Yes Historical Provider, MD  cephALEXin (KEFLEX) 500 MG capsule Take 1 capsule (500 mg total) by mouth 2 (two) times daily. 08/25/15   Lisa Roca, MD    REVIEW OF SYSTEMS:  Review of Systems  Constitutional: Negative for chills, fever, malaise/fatigue and weight loss.  HENT: Negative for ear pain, hearing loss and tinnitus.   Eyes: Negative for blurred vision, double vision, pain and redness.  Respiratory: Negative for cough, hemoptysis and shortness of breath.   Cardiovascular: Negative for chest pain, palpitations, orthopnea and leg swelling.  Gastrointestinal: Negative for abdominal pain, constipation, diarrhea, nausea and vomiting.  Genitourinary: Negative for dysuria, frequency and hematuria.  Musculoskeletal: Positive for falls and joint pain (left lower leg and knee). Negative for back pain  and neck pain.  Skin:       No acne, rash, or lesions  Neurological: Negative for dizziness, tremors, focal weakness and weakness.  Endo/Heme/Allergies: Negative for polydipsia. Does not bruise/bleed easily.  Psychiatric/Behavioral: Negative for depression. The patient is not nervous/anxious and does not have insomnia.      VITAL SIGNS:   Vitals:   08/25/15 1850 08/25/15 2100 08/25/15 2215 08/25/15 2325  BP: (!) 187/90  (!) 188/72 (!) 162/67 (!) 173/71  Pulse: 66 71 70 65  Resp: 18 16 16 13   Temp: 97.7 F (36.5 C)     TempSrc: Oral     SpO2: 98% 96% 95% 93%  Weight: 97.2 kg (214 lb 4.8 oz)     Height: 5\' 2"  (1.575 m)      Wt Readings from Last 3 Encounters:  08/25/15 97.2 kg (214 lb 4.8 oz)  06/28/15 94.3 kg (208 lb)  05/28/15 93.5 kg (206 lb 2.1 oz)    PHYSICAL EXAMINATION:  Physical Exam  Vitals reviewed. Constitutional: She is oriented to person, place, and time. She appears well-developed and well-nourished. No distress.  HENT:  Head: Normocephalic and atraumatic.  Mouth/Throat: Oropharynx is clear and moist.  Eyes: Conjunctivae and EOM are normal. Pupils are equal, round, and reactive to light. No scleral icterus.  Neck: Normal range of motion. Neck supple. No JVD present. No thyromegaly present.  Cardiovascular: Normal rate, regular rhythm and intact distal pulses.  Exam reveals no gallop and no friction rub.   No murmur heard. Respiratory: Effort normal and breath sounds normal. No respiratory distress. She has no wheezes. She has no rales.  GI: Soft. Bowel sounds are normal. She exhibits no distension. There is no tenderness.  Musculoskeletal: Normal range of motion. She exhibits tenderness (Left lower leg and knee pain). She exhibits no edema.  No arthritis, no gout  Lymphadenopathy:    She has no cervical adenopathy.  Neurological: She is alert and oriented to person, place, and time. No cranial nerve deficit.  No dysarthria, no aphasia  Skin: Skin is warm and dry. No rash noted. No erythema.  Psychiatric: She has a normal mood and affect. Her behavior is normal. Judgment and thought content normal.    LABORATORY PANEL:   CBC  Recent Labs Lab 08/25/15 2005  WBC 10.3  HGB 13.0  HCT 38.3  PLT 179   ------------------------------------------------------------------------------------------------------------------  Chemistries   Recent Labs Lab 08/25/15 2005  NA 140  K  3.8  CL 107  CO2 27  GLUCOSE 109*  BUN 20  CREATININE 0.56  CALCIUM 9.3  AST 26  ALT 19  ALKPHOS 94  BILITOT 1.1   ------------------------------------------------------------------------------------------------------------------  Cardiac Enzymes  Recent Labs Lab 08/25/15 2005  TROPONINI <0.03   ------------------------------------------------------------------------------------------------------------------  RADIOLOGY:  Dg Ankle Complete Left  Result Date: 08/25/2015 CLINICAL DATA:  Left ankle pain status post fall. EXAM: LEFT ANKLE COMPLETE - 3+ VIEW COMPARISON:  None. FINDINGS: Mild osteopenia. There is no evidence of fracture, dislocation, or joint effusion. There is no evidence of significant arthropathy or other focal bone abnormality. Soft tissues demonstrate diffuse ankle swelling. IMPRESSION: No acute fracture or dislocation identified about the left ankle. Electronically Signed   By: Fidela Salisbury M.D.   On: 08/25/2015 22:13  Ct Head Wo Contrast  Result Date: 08/25/2015 CLINICAL DATA:  [place] out of car today. Scalp laceration. Initial encounter. EXAM: CT HEAD WITHOUT CONTRAST TECHNIQUE: Contiguous axial images were obtained from the base of the skull through the vertex without  intravenous contrast. COMPARISON:  None. FINDINGS: There is no evidence of acute cortical infarct, intracranial hemorrhage, mass, midline shift, or extra-axial fluid collection. Ventricles and sulci are normal. Deep cerebral white matter hypodensities are nonspecific but compatible with very mild chronic small vessel ischemic disease. A left occipital scalp laceration is noted. Skin staples are in place. Orbits are unremarkable. Left maxillary sinus opacification is partially visualized with partially calcified internal material and marked osseous sinus wall thickening suggestive of chronic sinusitis. There is bowing of the medial wall of the sinus into the nasal cavity. The mastoid air cells are  clear. Calcified atherosclerosis is noted at the skullbase. No skull fracture is identified. IMPRESSION: 1. No evidence of acute intracranial abnormality. 2. Left occipital scalp laceration. Electronically Signed   By: Logan Bores M.D.   On: 08/25/2015 19:53  Ct Hip Left Wo Contrast  Result Date: 08/25/2015 CLINICAL DATA:  75 year old female with fall and left hip pain. EXAM: CT OF THE LEFT HIP WITHOUT CONTRAST TECHNIQUE: Multidetector CT imaging of the left hip was performed according to the standard protocol. Multiplanar CT image reconstructions were also generated. COMPARISON:  Left hip radiograph dated 08/25/2015 FINDINGS: There is no acute fracture or dislocation. The bones are osteopenic which limits evaluation for fracture. No significant arthritic changes. No fluid collection or hematoma. The soft tissues are grossly unremarkable. IMPRESSION: No acute fracture or dislocation. Electronically Signed   By: Anner Crete M.D.   On: 08/25/2015 23:26  Dg Knee Complete 4 Views Left  Result Date: 08/25/2015 CLINICAL DATA:  Status post fall with left knee pain. EXAM: LEFT KNEE - COMPLETE 4+ VIEW COMPARISON:  None. FINDINGS: There is an incomplete fracture of the posterior medial aspect of the proximal tibia with no significant displacement or angulation. There are advanced osteoarthritic changes of the left knee with 3 compartment joint space narrowing, subchondral sclerosis and osteophyte formation. The changes are worse in the medial compartment. There is a small suprapatellar joint effusion. IMPRESSION: Incomplete fracture of the posterior medial aspect of the proximal tibia without, 4 cm inferior to the knee joint. 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 Electronically Signed   By: Fidela Salisbury M.D.   On: 08/25/2015 22:15  Dg Hip Unilat With Pelvis 2-3 Views Left  Result Date: 08/25/2015 CLINICAL DATA:  Patient' s knee gave while  she was getting out of the car. She fell in the driveway and hit her head on the gravel. She is having left hip and leg pain. She is not able to move her leg without pain. She is currently undergoing cancer treatments. Hx breast ca, gerd, htn, mesenteric mass 2016, thyroid disease, hysterectomy EXAM: DG HIP (WITH OR WITHOUT PELVIS) 2-3V LEFT COMPARISON:  None. FINDINGS: No fracture.  No bone lesion. Hip joints, SI joints and symphysis pubis are normally spaced and aligned. Bones are diffusely demineralized. Soft tissues are unremarkable. IMPRESSION: No fracture or significant hip joint abnormality. Electronically Signed   By: Lajean Manes M.D.   On: 08/25/2015 19:48   EKG:   Orders placed or performed during the hospital encounter of 08/25/15  . ED EKG  . ED EKG    IMPRESSION AND PLAN:  Principal Problem:   Left tibial fracture - nonweightbearing, knee immobilizer, orthopedics consult. Pain control ordered. Active Problems:   Fall - mechanical fall, but resulted in subsequent left tibial fracture. See above   Anxiety - not currently on home meds for this, treat when  necessary   HTN (hypertension) - continue home meds   Hypothyroidism - home dose thyroid replacement  All the records are reviewed and case discussed with ED provider. Management plans discussed with the patient and/or family.  DVT PROPHYLAXIS: SubQ lovenox  GI PROPHYLAXIS: None  ADMISSION STATUS: Observation  CODE STATUS: Full Code Status History    This patient does not have a recorded code status. Please follow your organizational policy for patients in this situation.      TOTAL TIME TAKING CARE OF THIS PATIENT: 40 minutes.    Emma [name] FIELDING 08/25/2015, 11:48 PM  [name]  Office  [phone]  CC: Primary care physician; [name], MD

## 2015-08-25 NOTE — ED Provider Notes (Addendum)
[facility] Emergency Department Provider Note ____________________________________________  Time seen:  I have reviewed the triage vital signs and the triage nursing note.  HISTORY  Chief Complaint Fall   Historian Patient and daughter  HPI Emma [name] is a 75 y.o. female here after what sound like mechanical fall, but fell backward striking her head with laceration.  Also complaining of left hip pain, severe and pain down entire left leg.  No deformity. No chest pain. No neck pain. No loss of consciousness. No vomiting. She has had dark/foul urine. No abdominal pain. No fever. Symptoms are moderate to severe in terms of pain. Movement of her left hip makes it worse.    Past Medical History:  Diagnosis Date  . Breast cancer, left breast ([place]) 06/19/2014  . GERD (gastroesophageal reflux disease)   . Hypertension   . 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 Thyroid disease     Patient Active Problem List   Diagnosis Date Noted  . IDA (iron deficiency anemia) 06/19/2014  . Breast cancer, left breast ([place]) 06/19/2014  . 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 ABDOMINAL HYSTERECTOMY     partial  . BREAST EXCISIONAL BIOPSY Left 12/2012   +  . BREAST LUMPECTOMY WITH SENTINEL LYMPH NODE BIOPSY Left 2014  . CHOLECYSTECTOMY      Prior to Admission medications   Medication Sig Start Date End Date Taking? Authorizing Provider  amLODipine (NORVASC) 5 MG tablet Take by mouth. 05/24/14 05/24/15  Historical Provider, MD  Calcium-Vitamin D 600-200 MG-UNIT tablet Take by mouth.    Historical Provider, MD  cephALEXin (KEFLEX) 500 MG capsule Take 1 capsule (500 mg total) by mouth 2 (two) times daily. 08/25/15   Lisa Roca, MD  diazepam (VALIUM) 5 MG tablet Take by mouth.    Historical Provider, MD  ergocalciferol (VITAMIN D2) 50000 units capsule Take 1 capsule (50,000 Units total) by mouth once a week. 05/28/15   [name], MD  esomeprazole (NEXIUM) 40 MG capsule Take by mouth.    Historical Provider, MD  fluticasone (FLONASE) 50 MCG/ACT nasal spray Place 1 spray into both nostrils 2 (two) times daily. 12/01/14   Historical Provider, MD  HYDROcodone-acetaminophen (NORCO/VICODIN) 5-325 MG tablet Take by mouth. 08/10/14   Historical Provider, MD  ibuprofen (ADVIL,MOTRIN) 200 MG tablet Take by mouth.    Historical Provider, MD  labetalol (NORMODYNE) 300 MG tablet TAKE 1 TABLET (300 MG TOTAL) BY MOUTH 2 (TWO) TIMES DAILY. 08/09/14   Historical Provider, MD  levothyroxine (SYNTHROID) 88 MCG tablet Take by mouth. 06/20/14   Historical Provider, MD  losartan-hydrochlorothiazide (HYZAAR) 100-25 MG per tablet Take by mouth. 12/20/13   Historical Provider, MD  octreotide (SANDOSTATIN LAR) 30 MG injection Inject into the muscle.    Historical Provider, MD  Vitamin D, Ergocalciferol, (DRISDOL) 50000 units CAPS capsule Take by mouth.    Historical Provider, MD    Allergies  Allergen Reactions  . Sulfa Antibiotics     Other reaction(s): Kidney Disorder    Family History  Problem Relation Age of Onset  . Breast cancer Mother 7  . Breast cancer Maternal Aunt 68    Social History Social History  Substance Use Topics  . Smoking status: Never Smoker  . Smokeless tobacco: Never Used  . Alcohol use Not on file    Review of Systems  Constitutional: Negative for fever. Eyes: Negative for visual changes. ENT: Negative for sore throat. Cardiovascular: Negative  for chest pain. Respiratory: Negative for shortness of breath. Gastrointestinal: Negative for abdominal pain, vomiting and diarrhea. Genitourinary: Negative for dysuria. Musculoskeletal: Negative for back pain. Skin: Negative for rash. Neurological: Positive for headache. 10 point Review of Systems otherwise negative ____________________________________________   PHYSICAL EXAM:  VITAL SIGNS: ED Triage Vitals  Enc Vitals Group     BP 08/25/15  1850 (!) 187/90     Pulse Rate 08/25/15 1850 66     Resp 08/25/15 1850 18     Temp 08/25/15 1850 97.7 F (36.5 C)     Temp Source 08/25/15 1850 Oral     SpO2 08/25/15 1850 98 %     Weight 08/25/15 1850 214 lb 4.8 oz (97.2 kg)     Height 08/25/15 1850 5\' 2"  (1.575 m)     Head Circumference --      Peak Flow --      Pain Score 08/25/15 1851 8     Pain Loc --      Pain Edu? --      Excl. in [place]? --      Constitutional: Alert and oriented. Well appearing overall. Heart appearing in pain, hollering occasionally. HEENT   Head: 2cm laceration posterior scalp      Eyes: Conjunctivae are normal. PERRL. Normal extraocular movements.      Ears:         Nose: No congestion/rhinnorhea.   Mouth/Throat: Mucous membranes are moist.   Neck: No stridor. Nontender C-spine to palpation or range of motion. Cardiovascular/Chest: Normal rate, regular rhythm.  No murmurs, rubs, or gallops. Respiratory: Normal respiratory effort without tachypnea nor retractions. Breath sounds are clear and equal bilaterally. No wheezes/rales/rhonchi. Gastrointestinal: Soft. No distention, no guarding, no rebound. Nontender.    Genitourinary/rectal:Deferred Musculoskeletal: Stable, patient reports pain at the left hip with range of motion active or passive. She is also complaining of pain shooting down her leg. No back pain. 2+ bilateral lower extremity pitting edema. Neurologic:  Normal speech and language. No gross or focal neurologic deficits are appreciated. Skin:  Skin is warm, dry and intact. No rash noted.  ____________________________________________   EKG I, Lisa Roca, MD, the attending physician have personally viewed and interpreted all ECGs.  64 bpm. Normal sinus rhythm. Narrow QRS. Normal axis. Nonspecific ST and T wave. LVH with repol changes. ____________________________________________  LABS (pertinent positives/negatives)  Labs Reviewed  COMPREHENSIVE METABOLIC PANEL - Abnormal;  Notable for the following:       Result Value   Glucose, Bld 109 (*)    All other components within normal limits  CBC WITH DIFFERENTIAL/PLATELET - Abnormal; Notable for the following:    Neutro Abs 8.6 (*)    Lymphs Abs 0.5 (*)    All other components within normal limits  URINALYSIS COMPLETEWITH MICROSCOPIC ([facility] ONLY) - Abnormal; Notable for the following:    Color, Urine YELLOW (*)    APPearance CLEAR (*)    Ketones, ur TRACE (*)    Hgb urine dipstick 2+ (*)    Nitrite POSITIVE (*)    Leukocytes, UA 1+ (*)    Bacteria, UA MANY (*)    All other components within normal limits  URINE CULTURE  TROPONIN I    ____________________________________________  RADIOLOGY All Xrays were viewed by me. Imaging interpreted by Radiologist.  CT head without contrast:  No evidence of acute intracranial antibody. Left occipital scalp laceration. Left hip x-ray: No fracture or significant hip joint abnormality.  Left ankle x-ray, left knee x-ray pending  CT left hip: Pending  __________________________________________  PROCEDURES  Procedure(s) performed: None  Critical Care performed: None  ____________________________________________   ED COURSE / ASSESSMENT AND PLAN  Pertinent labs & imaging results that were available during my care of the patient were reviewed by me and considered in my medical decision making (see chart for details).   This patient sounds like she said had a mechanical fall, but when asked if she would like some additional laboratory and urine studies the daughter agreed that they should go ahead and do this.  Traumatic perspective, I placed 2 staples in her scalp laceration which was superficial. Her head CT shows no intracranial trauma. She had normal mental status and I cleared her C-spine clinically.  She is mostly concerned about her left hip pain and her x-ray was negative. When I went back to reassess and discuss her negative findings, she stated that  she was in severe pain and was unable to lift her leg nor stand on it. She is now pointing already down her leg and wants to have her knee and ankle imaged as well and I will add on x-rays for this although I have a low suspicion for acute bony abnormality there. Given the fact that she stated that her most severe pain is still at her head, I will add on a hip CT to rule out occult fracture.  She was found to have a urinary tract infection and will be started on Keflex and a urine culture was sent. Next line Patient care transferred to Dr. Karma Greaser at shift change, 9:30 PM. X-rays of the ankle and knee are pending as is the CT of the left hip. Family states they would like to take her home if at all possible.     CONSULTATIONS:   None   Patient / Family / Caregiver informed of clinical course, medical decision-making process, and agree with plan.     ___________________________________________   FINAL CLINICAL IMPRESSION(S) / ED DIAGNOSES   Final diagnoses:  Acute hip pain, left  Scalp laceration, initial encounter  Fall, initial encounter  UTI (lower urinary tract infection)              Note: This dictation was prepared with Dragon dictation. Any transcriptional errors that result from this process are unintentional    Lisa Roca, MD 08/25/15 2132    Lisa Roca, MD 08/25/15 2140

## 2015-08-26 DIAGNOSIS — S82132A Displaced fracture of medial condyle of left tibia, initial encounter for closed fracture: Secondary | ICD-10-CM | POA: Diagnosis not present

## 2015-08-26 LAB — SURGICAL PCR SCREEN
MRSA, PCR: NEGATIVE
STAPHYLOCOCCUS AUREUS: NEGATIVE

## 2015-08-26 MED ORDER — SODIUM CHLORIDE 0.9% FLUSH
3.0000 mL | Freq: Two times a day (BID) | INTRAVENOUS | Status: DC
  Administered 2015-08-26 (×2): 3 mL via INTRAVENOUS

## 2015-08-26 MED ORDER — OXYCODONE HCL 5 MG PO TABS: 5.0000 mg | ORAL_TABLET | Freq: Four times a day (QID) | ORAL | 0 refills | Status: DC | PRN

## 2015-08-26 MED ORDER — OXYCODONE HCL 5 MG PO TABS
5.0000 mg | ORAL_TABLET | ORAL | Status: DC | PRN
  Administered 2015-08-26 (×2): 5 mg via ORAL
  Filled 2015-08-26 (×2): qty 1

## 2015-08-26 MED ORDER — HYDRALAZINE HCL 20 MG/ML IJ SOLN
10.0000 mg | Freq: Four times a day (QID) | INTRAMUSCULAR | Status: DC | PRN
  Administered 2015-08-26: 10 mg via INTRAVENOUS
  Filled 2015-08-26: qty 1

## 2015-08-26 MED ORDER — ONDANSETRON HCL 4 MG PO TABS: 4.0000 mg | ORAL_TABLET | Freq: Four times a day (QID) | ORAL | Status: DC | PRN

## 2015-08-26 MED ORDER — ONDANSETRON HCL 4 MG/2ML IJ SOLN: 4.0000 mg | Freq: Four times a day (QID) | INTRAMUSCULAR | Status: DC | PRN

## 2015-08-26 MED ORDER — MORPHINE SULFATE (PF) 2 MG/ML IV SOLN: 2.0000 mg | INTRAVENOUS | Status: DC | PRN

## 2015-08-26 MED ORDER — LEVOTHYROXINE SODIUM 88 MCG PO TABS
88.0000 ug | ORAL_TABLET | Freq: Every day | ORAL | Status: DC
  Administered 2015-08-27: 88 ug via ORAL
  Filled 2015-08-26: qty 1

## 2015-08-26 MED ORDER — ENOXAPARIN SODIUM 40 MG/0.4ML ~~LOC~~ SOLN
40.0000 mg | SUBCUTANEOUS | Status: DC
  Filled 2015-08-26: qty 0.4

## 2015-08-26 MED ORDER — LABETALOL HCL 200 MG PO TABS
300.0000 mg | ORAL_TABLET | Freq: Two times a day (BID) | ORAL | Status: DC
  Administered 2015-08-26 – 2015-08-27 (×3): 300 mg via ORAL
  Filled 2015-08-26 (×3): qty 2

## 2015-08-26 MED ORDER — ACETAMINOPHEN 325 MG PO TABS: 650.0000 mg | ORAL_TABLET | Freq: Four times a day (QID) | ORAL | Status: DC | PRN

## 2015-08-26 MED ORDER — ACETAMINOPHEN 650 MG RE SUPP: 650.0000 mg | Freq: Four times a day (QID) | RECTAL | Status: DC | PRN

## 2015-08-26 MED ORDER — ANASTROZOLE 1 MG PO TABS
1.0000 mg | ORAL_TABLET | Freq: Every day | ORAL | Status: DC
  Administered 2015-08-26 – 2015-08-27 (×2): 1 mg via ORAL
  Filled 2015-08-26 (×2): qty 1

## 2015-08-26 NOTE — ED Notes (Signed)
Admitting MD at bedside.

## 2015-08-26 NOTE — Care Management Note (Signed)
Case Management Note  Patient Details  Name: Emma [name] MRN: [id number] Date of Birth: 1940/05/21  Subjective/Objective:    Daughter [name] now reports that she will take Mrs [place] home with her to [address] [place], [place] ph: [phone]. Requests [facility] and requests a wheelchair.                 Action/Plan:   Expected Discharge Date:                  Expected Discharge Plan:     In-House Referral:     Discharge planning Services     Post Acute Care Choice:    Choice offered to:     DME Arranged:    DME Agency:     HH Arranged:    HH Agency:     Status of Service:     If discussed at [employer] of Stay Meetings, dates discussed:    Additional Comments:  Jersee Winiarski A, RN 08/26/2015, 2:24 PM

## 2015-08-26 NOTE — Discharge Instructions (Addendum)
Resume diet as before.  Continue activity with physical therapy.  Non weight bearing on left leg

## 2015-08-26 NOTE — Consult Note (Signed)
ORTHOPAEDIC CONSULTATION  PATIENT NAME: Emma [name] DOB: [birth year]  MRN: [id number]  REQUESTING PHYSICIAN: Hillary Bow, MD  Chief Complaint: left tibia fracture  HPI: Emma [name] is a 75 y.o. female seen in consultation at the request of Hillary Bow, MD for a left tibia fracture.  Emma [name] sustained a ground level fall yesterday evening.  She had pain isolated to the left lower extremity but most severe just distal to the left knee.  She has been more comfortable after the knee immobilizer was placed.  The patient and her daughter state that she has had pain in the low back radiating down the posterior leg for a long time, but has been worsening recently.  They deny pain isolated to the left hip.  Past Medical History:  Diagnosis Date  . Breast cancer, left breast ([place]) 06/19/2014  . GERD (gastroesophageal reflux disease)   . Hypertension   . 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 Thyroid disease    Past Surgical History:  Procedure Laterality Date  . ABDOMINAL HYSTERECTOMY     partial  . BREAST EXCISIONAL BIOPSY Left 12/2012   +  . BREAST LUMPECTOMY WITH SENTINEL LYMPH NODE BIOPSY Left 2014  . CHOLECYSTECTOMY     Social History   Social History  . Marital status: Widowed    Spouse name: N/A  . Number of children: N/A  . Years of education: N/A   Social History Main Topics  . Smoking status: Never Smoker  . Smokeless tobacco: Never Used  . Alcohol use No  . Drug use: No  . Sexual activity: Not Asked   Other Topics Concern  . None   Social History Narrative  . None   Family History  Problem Relation Age of Onset  . Breast cancer Mother 38  . Breast cancer Maternal Aunt 68   Allergies  Allergen Reactions  . Sulfa Antibiotics     Other reaction(s): Kidney Disorder   Prior to Admission medications   Medication Sig Start Date End Date Taking? Authorizing Provider  anastrozole (ARIMIDEX) 1 MG tablet Take 1 mg by mouth daily.   Yes Historical Provider, MD   ergocalciferol (VITAMIN D2) 50000 units capsule Take 1 capsule (50,000 Units total) by mouth once a week. 05/28/15  Yes [name], MD  ibuprofen (ADVIL,MOTRIN) 200 MG tablet Take 200 mg by mouth every 6 (six) hours as needed for headache or mild pain.    Yes Historical Provider, MD  labetalol (NORMODYNE) 300 MG tablet TAKE 1 TABLET (300 MG TOTAL) BY MOUTH 2 (TWO) TIMES DAILY. 08/09/14  Yes Historical Provider, MD  levothyroxine (SYNTHROID) 88 MCG tablet Take 88 mcg by mouth daily before breakfast.  06/20/14  Yes Historical Provider, MD  octreotide (SANDOSTATIN LAR) 30 MG injection Inject 30 mg into the muscle every 28 (twenty-eight) days.    Yes Historical Provider, MD  cephALEXin (KEFLEX) 500 MG capsule Take 1 capsule (500 mg total) by mouth 2 (two) times daily. 08/25/15   Lisa Roca, MD  oxyCODONE (OXY IR/ROXICODONE) 5 MG immediate release tablet Take 1 tablet (5 mg total) by mouth every 6 (six) hours as needed for moderate pain or severe pain. 08/26/15   Hillary Bow, MD     ROS: Denies fevers or chills.  Physical Exam: General: Alert and alert in no acute distress. HEENT: Atraumatic and normocephalic. Sclera are clear. Extraocular motion is intact.  Cardiovascular: Pedal pulses are palpable bilaterally.  No significant pretibial or ankle edema. Skin: No lesions in the  area of chief complaint Neurologic: Awake, alert, and oriented. Sensory function is grossly intact. Motor strength is felt to be 5 over 5 bilaterally. No clonus or tremor. Good motor coordination. MUSCULOSKELETAL: Tenderness of the proximal tibia.  No knee effusion or swelling of the proximal tibia.  Left compartments are soft.    Imaging: Left hip radiographs and CT show no evidence of fracture.  There are no lytic or blastic lesions. Left tibia radiographs show a proximal 1/3 nondisplaced fracture.  The fracture extends across the medial and lateral cortices but is not displaced.  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   Ct Hip Left Wo Contrast  Result Date: 08/25/2015 CLINICAL DATA:  75 year old female with fall and left hip pain. EXAM: CT OF THE LEFT HIP WITHOUT CONTRAST TECHNIQUE: Multidetector CT imaging of the left hip was performed according to the standard protocol. Multiplanar CT image reconstructions were also generated. COMPARISON:  Left hip radiograph dated 08/25/2015 FINDINGS: There is no acute fracture or dislocation. The bones are osteopenic which limits evaluation for fracture. No significant arthritic changes. No fluid collection or hematoma. The soft tissues are grossly unremarkable. IMPRESSION: No acute fracture or dislocation. Electronically Signed   By: Anner Crete M.D.   On: 08/25/2015 23:26  Dg Knee Complete 4 Views Left  Result Date: 08/25/2015 CLINICAL DATA:  Status post fall with left knee pain. EXAM: LEFT KNEE - COMPLETE 4+ VIEW COMPARISON:  None. FINDINGS: There is an incomplete fracture of the posterior medial aspect of the proximal tibia with no significant displacement or angulation. There are advanced osteoarthritic changes of the left knee with 3 compartment joint space narrowing, subchondral sclerosis and osteophyte formation. The changes are worse in the medial compartment. There is a small suprapatellar joint effusion. IMPRESSION: Incomplete fracture of the posterior medial aspect of the proximal tibia without, 4 cm inferior to the knee joint. 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 Electronically Signed   By: Fidela Salisbury M.D.   On: 08/25/2015 22:15  Dg Hip Unilat With Pelvis 2-3 Views Left  Result Date: 08/25/2015 CLINICAL DATA:  Patient' s knee gave while she was getting out of the car. She fell in the driveway and hit her head on the gravel. She is having left hip and leg pain. She is not able to move her leg without pain. She is currently  undergoing cancer treatments. Hx breast ca, gerd, htn, mesenteric mass 2016, thyroid disease, hysterectomy EXAM: DG HIP (WITH OR WITHOUT PELVIS) 2-3V LEFT COMPARISON:  None. FINDINGS: No fracture.  No bone lesion. Hip joints, SI joints and symphysis pubis are normally spaced and aligned. Bones are diffusely demineralized. Soft tissues are unremarkable. IMPRESSION: No fracture or significant hip joint abnormality. Electronically Signed   By: Lajean Manes M.D.   On: 08/25/2015 19:48   Assessment: 75 year old female with nondisplaced left proximal tibia fracture.  I discussed my recommendations and plan of care with the patient and also her daughter [name] over the phone.  At this time the fracture is nondisplaced and surgery is not warranted.  ORIF or intramedullary nail may decrease the risk of displacement, but would not decrease the length of time until she is able to bear weight on the left leg.  I did discuss with the patient and her daughter that if the fracture displaces she may benefit from fixation.   With a nondisplaced fracture and an intact  fibula, the risk of displacement is small but possible.  She will need strict nonweightbearing and immobilization.  I recommend a knee immobilizer with transition to a long leg cast.   Plan: - Continue NWB of the left leg - Continue knee immobilizer to left leg - Follow up with [facility], Dr. Caron Presume K. Page Spiro, MD

## 2015-08-26 NOTE — Progress Notes (Signed)
PT Cancellation Note  Patient Details Name: Emma [name] MRN: [id number] DOB: January 14, 1941   Cancelled Treatment:    Reason Eval/Treat Not Completed: Medical issues which prohibited therapy (awaiting orthopedic consultation).  Will await ortho disposition to determine PT needs.   Ramond Dial 08/26/2015, 9:36 AM    Mee Hives, PT MS Acute Rehab Dept. Number: [place] and [place]

## 2015-08-26 NOTE — Care Management Note (Signed)
Case Management Note  Patient Details  Name: [name] MRN: [id number] Date of Birth: Jun 06, 1940  Subjective/Objective:           Extremely HOH, 75yo [name] was admitted with a closed LEFT tibial fracture after a fall at home. She reports that she has "bone-on-bone" arthritis in her RIGHT knee. 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 Hx of breast and thyroid cancer. Currently followed by Dr Rogue Bussing at [facility]. The non-weight bearing closed left tibial fracture places all her weight onto the right arthritic knee and she is unable to stand even with a walker. She is non-ambulatory at this time.She lives alone but has a "daughter who works". No home equipment. No home oxygen, No home health services. PCP=Dr [name]. Pharmacy=[facility] in [place]. Discussed discharge planning with Dr Darvin Neighbours per [name] is now totally non-ambulatory. Her home is on one level and she has two steps to get to her front door. Prior to this left tibial injury,  Mrs Bigalke reports that she had to scoot up the two steps on her buttocks because she could not weight-bear at all to step up with her severely arthritic knees. Case management will follow for discharge planning.           Action/Plan:   Expected Discharge Date:                  Expected Discharge Plan:     In-House Referral:     Discharge planning Services     Post Acute Care Choice:    Choice offered to:     DME Arranged:    DME Agency:     HH Arranged:    HH Agency:     Status of Service:     If discussed at [employer] of Stay Meetings, dates discussed:    Additional Comments:  Santanna Olenik A, RN 08/26/2015, 1:00 PM

## 2015-08-26 NOTE — Evaluation (Signed)
Physical Therapy Evaluation Patient Details Name: Emma [name] MRN: [id number] DOB: [birth year] Today's Date: 08/26/2015   History of Present Illness  75 yo female with fall resulting in non surgical fracture to proximal L tibia, now in immobilizer awaiting orthopedic outpatient consult.    Clinical Impression  Pt is up to attempt standing with poor tolerance due to R knee OA, and has multiple areas of CA as a complicating factor as well.  Her family is not fully aware of the situation per pt and will anticipate them getting involved of necessity to transition her care home if needed.  Pt is not able to stand and will need to be further instructed for sliding transfers to chair or wc in the mean time.  Follow acutely to work toward more independent transfers.    Follow Up Recommendations SNF    Equipment Recommendations  None recommended by PT    Recommendations for Other Services Rehab consult     Precautions / Restrictions Precautions Precautions: Fall (LLE not able to be out of immob) Precaution Comments: No ROM to L knee Required Braces or Orthoses: Knee Immobilizer - Left Knee Immobilizer - Left: On at all times Restrictions Weight Bearing Restrictions: Yes LLE Weight Bearing: Non weight bearing      Mobility  Bed Mobility Overal bed mobility: Needs Assistance Bed Mobility: Supine to Sit     Supine to sit: Mod assist     General bed mobility comments: using elevated HOB and bedrail  Transfers Overall transfer level: Needs assistance Equipment used: Rolling walker (2 wheeled);1 person hand held assist Transfers: Sit to/from Stand;Lateral/Scoot Transfers Sit to Stand: Max assist;From elevated surface (could not tolerate standing on RLE)        Lateral/Scoot Transfers: Min assist;From elevated surface (bed to chair with drop arm) General transfer comment: cued pt to slide on bed pad to chair with hlep mainly to keep LLE NWB  Ambulation/Gait              General Gait Details: unable  Stairs            Wheelchair Mobility    Modified Rankin (Stroke Patients Only)       Balance Overall balance assessment: History of Falls;Needs assistance Sitting-balance support: Feet supported Sitting balance-Leahy Scale: Fair   Postural control: Posterior lean Standing balance support: Bilateral upper extremity supported Standing balance-Leahy Scale: Zero                               Pertinent Vitals/Pain Pain Assessment: Faces Pain Score: 8  Faces Pain Scale: Hurts whole lot Pain Location: LLE with any movement Pain Descriptors / Indicators: Aching;Sore Pain Intervention(s): Limited activity within patient's tolerance;Monitored during session;Repositioned (ele vated leg in chair)    Home Living Family/patient expects to be discharged to:: Unsure                      Prior Function Level of Independence: Independent with assistive device(s)               Hand Dominance        Extremity/Trunk Assessment   Upper Extremity Assessment: Generalized weakness           Lower Extremity Assessment: Generalized weakness      Cervical / Trunk Assessment: Kyphotic  Communication   Communication: HOH  Cognition Arousal/Alertness: Awake/alert Behavior During Therapy: WFL for tasks assessed/performed Overall Cognitive Status: Within  Functional Limits for tasks assessed                      General Comments General comments (skin integrity, edema, etc.): Pt was up in chair with help and has LLE elevated in brace, her case manager was in to discuss her options for care.  Pt has not spoke to her children yet to see if she can stay with them.    Exercises        Assessment/Plan    PT Assessment Patient needs continued PT services  PT Diagnosis Difficulty walking;Acute pain   PT Problem List Decreased strength;Decreased range of motion;Decreased activity tolerance;Decreased  balance;Decreased mobility;Decreased coordination;Decreased knowledge of use of DME;Decreased safety awareness;Decreased knowledge of precautions;Obesity;Decreased skin integrity;Pain  PT Treatment Interventions DME instruction;Gait training;Stair training;Functional mobility training;Therapeutic activities;Therapeutic exercise;Balance training;Neuromuscular re-education;Patient/family education   PT Goals (Current goals can be found in the Care Plan section) Acute Rehab PT Goals Patient Stated Goal: to get moving/walking again PT Goal Formulation: With patient Time For Goal Achievement: 09/09/15 Potential to Achieve Goals: Good    Frequency 7X/week   Barriers to discharge Inaccessible home environment;Decreased caregiver support home alone with stairs to enter house    Co-evaluation               End of Session Equipment Utilized During Treatment: Gait belt Activity Tolerance: Patient limited by pain;Patient limited by fatigue Patient left: in chair;with call bell/phone within reach;with chair alarm set;with nursing/sitter in room Nurse Communication: Mobility status;Weight bearing status    Functional Assessment Tool Used: clinical judgment Functional Limitation: Mobility: Walking and moving around Mobility: Walking and Moving Around Current Status [id number]): At least 60 percent but less than 80 percent impaired, limited or restricted Mobility: Walking and Moving Around Goal Status [phone]): At least 1 percent but less than 20 percent impaired, limited or restricted    Time: 1212-1247 PT Time Calculation (min) (ACUTE ONLY): 35 min   Charges:   PT Evaluation $PT Eval Moderate Complexity: 1 Procedure PT Treatments $Therapeutic Activity: 8-22 mins   PT G Codes:   PT G-Codes **NOT FOR INPATIENT CLASS** Functional Assessment Tool Used: clinical judgment Functional Limitation: Mobility: Walking and moving around Mobility: Walking and Moving Around Current Status [id number]): At  least 60 percent but less than 80 percent impaired, limited or restricted Mobility: Walking and Moving Around Goal Status [phone]): At least 1 percent but less than 20 percent impaired, limited or restricted    Ramond Dial 08/26/2015, 1:01 PM    Mee Hives, PT MS Acute Rehab Dept. Number: [place] and [place]

## 2015-08-26 NOTE — Care Management Obs Status (Signed)
[place] NOTIFICATION   Patient Details  Name: Emma [name] MRN: [id number] Date of Birth: [birth year]   Medicare Observation Status Notification Given:  Yes    Ival Bible, RN 08/26/2015, 6:42 PM

## 2015-08-26 NOTE — Clinical Social Work Note (Signed)
Clinical Social Work Assessment  Patient Details  Name: Emma [name] MRN: [id number] Date of Birth: [birth year]  Date of referral:  08/26/15               Reason for consult:  Facility Placement                Permission sought to share information with:    Permission granted to share information::     Name::        Agency::     Relationship::     Contact Information:     Housing/Transportation Living arrangements for the past 2 months:  Single Family Home Source of Information:  Patient, Adult Children Patient Interpreter Needed:  None Criminal Activity/Legal Involvement Pertinent to Current Situation/Hospitalization:  No - Comment as needed Significant Relationships:  Adult Children, Other Family Members Lives with:  Self Do you feel safe going back to the place where you live?  Yes Need for family participation in patient care:  Yes (Comment)  Care giving concerns:  Patient lives alone in [place] and is having a difficult time ambulating.    Social Worker assessment / plan:  Holiday representative (CSW) received SNF consult. PT is recommending SNF. Patient is under Medicare Observation. CSW met with patient and her two daughters Emma [name] [phone] and Emma [name] were at bedside. Patient was alert and oriented and sitting up in the chair. Patient was very hard of hearing. CSW introduced self and explained role of CSW department. CSW explained that patient's Medicare will not pay for SNF because she is under Observation. CSW explained that patient would require a 3 night inpatient qualifying stay at a hospital in order for Medicare to cover SNF. CSW discussed private pay options for SNF as well. Patient and daughter reported that patient cannot pay privately for SNF. Per daughter Emma [name] she will take patient home with her. Emma [name] requested [facility], a wheel chair and walker. [name]'s address is [address] [place], [place]. Patient reported that she goes to the [facility] once a month  for labs and takes a pill for chemo treatments. Patient and daughter are agreeable for patient tp go home to daughter [name]'s house with home health. RN case manager aware of above. CSW will continue to follow and assist as needed.    Employment status:  Retired Forensic scientist:  Medicare (Patient is under [employer] Observation) PT Recommendations:  [place] / Referral to community resources:  Other (Comment Required) ([place] )  Patient/Family's Response to care:  Patient and daughter are agreeable for patient to go to daughter [name]'s house.   Patient/Family's Understanding of and Emotional Response to Diagnosis, Current Treatment, and Prognosis:  Patient and daughter were pleasant and thanked CSW for visit.   Emotional Assessment Appearance:  Appears stated age Attitude/Demeanor/Rapport:    Affect (typically observed):  Accepting, Adaptable, Pleasant Orientation:  Oriented to Self, Oriented to Place, Oriented to  Time, Oriented to Situation Alcohol / Substance use:  Not Applicable Psych involvement (Current and /or in the community):  No (Comment)  Discharge Needs  Concerns to be addressed:  Discharge Planning Concerns Readmission within the last 30 days:  No Current discharge risk:  Chronically ill, Lives alone Barriers to Discharge:  Continued Medical Work up   [employer], Veronia Beets, LCSW 08/26/2015, 2:14 PM

## 2015-08-27 DIAGNOSIS — S82132A Displaced fracture of medial condyle of left tibia, initial encounter for closed fracture: Secondary | ICD-10-CM | POA: Diagnosis not present

## 2015-08-27 MED ORDER — AMLODIPINE BESYLATE 5 MG PO TABS: 5.0000 mg | ORAL_TABLET | Freq: Every day | ORAL | 0 refills | Status: DC

## 2015-08-27 MED ORDER — AMLODIPINE BESYLATE 5 MG PO TABS
5.0000 mg | ORAL_TABLET | Freq: Every day | ORAL | Status: DC
  Administered 2015-08-27: 5 mg via ORAL
  Filled 2015-08-27: qty 1

## 2015-08-27 NOTE — Progress Notes (Signed)
   Subjective:    Patient reports pain as moderate.   Patient is well, and has had no acute complaints or problems We will start therapy today.  Recommend STR however pt is in under observation and do not meet the 3 night requirement for rehab and subsequence has to go home. Very concern that the pt is unable to do this safely.  no nausea and no vomiting Patient denies any chest pains or shortness of breath. Objective: Vital signs in last 24 hours: Temp:  [98 F (36.7 C)-99.8 F (37.7 C)] 99.2 F (37.3 C) (07/31 0737) Pulse Rate:  [61-69] 69 (07/31 0737) Resp:  [16-20] 18 (07/31 0737) BP: (148-195)/(49-62) 153/49 (07/31 0737) SpO2:  [91 %-97 %] 91 % (07/31 0737) Heels are non tender and elevated off the bed using rolled towels Intake/Output from previous day: 07/30 0701 - 07/31 0700 In: 243 [P.O.:240; I.V.:3] Out: -  Intake/Output this shift: No intake/output data recorded.   Recent Labs  08/25/15 2005  HGB 13.0    Recent Labs  08/25/15 2005  WBC 10.3  RBC 4.34  HCT 38.3  PLT 179    Recent Labs  08/25/15 2005  NA 140  K 3.8  CL 107  CO2 27  BUN 20  CREATININE 0.56  GLUCOSE 109*  CALCIUM 9.3   No results for input(s): LABPT, INR in the last 72 hours.  EXAM General - Patient is Alert, Appropriate, Oriented and HOH Extremity - Neurologically intact Neurovascular intact Sensation intact distally Intact pulses distally Compartment soft Dressing - no dressing 2/2 no wound Motor Function - intact, moving foot and toes well on exam.    Past Medical History:  Diagnosis Date  . Breast cancer, left breast ([place]) 06/19/2014  . GERD (gastroesophageal reflux disease)   . Hypertension   . 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 Thyroid disease     Assessment/Plan:    Principal Problem:   Left tibial fracture Active Problems:   Fall   Anxiety   HTN (hypertension)   Hypothyroidism  Estimated body mass index is 39.2 kg/m as calculated from the following:  Height as of this encounter: 5\' 2"  (1.575 m).   Weight as of this encounter: 97.2 kg (214 lb 4.8 oz). Up with therapy Discharge home with home health    NWB to left leg Follow up in office in 7-10 days ASA - 81mg  qh unless contraindicated Ice pak to left knee as much as poss for the first week KI on at all times. No rom till seen in office  Sonia Bromell R. Esperance Lincoln 08/27/2015, 10:18 AM

## 2015-08-27 NOTE — Progress Notes (Signed)
New referral for [place] services of SN, PT, SW and aide received from [facility]. Ms. Boatwright is a 75 year old woman with a known history of breast cancer, mesenteric mass, HTN and thyroid disease admitted to [facility] on 7/29 following a ground level fall with a  Subsequent left tibial fracture. Orthopedics was consulted and recommended an immobilizer and nonweightbearing with outpatient follow-up. She was unable to ambulate and had significant pain and was admitted  for pain control.  Patient seen lying in bed, alert and oriented, interactive with Probation officer. [facility] services outlined and information left with ms. Lubinski. Writer also spoke with her daughter Aida Puffer via telephone, questions answered. Patient will have a wheel chair and BSC delivered to her room for [name] to take home. Plan is for discharge today via EMS. Patient information faxed to referral. Thank you for the opportunity top be involved in the care of this patient. Flo Shanks RN, BSN, [facility] Liaison [phone] c

## 2015-08-27 NOTE — Progress Notes (Signed)
Patient ready for d/c home with daughter. Will transport via EMS. Discharge instructions and prescriptions reviewed, questions answered.

## 2015-08-27 NOTE — Care Management (Signed)
Patient suffers from Arthritis knees, left Tibial fracture, Cancer which impairs their ability to perform daily activities like ambulating in the home.  A walker will not resolve issue with performing activities of daily living. A wheelchair will allow patient to safely perform daily activities. Patient can safely propel the wheelchair in the home or has a caregiver who can provide assistance.

## 2015-08-27 NOTE — Progress Notes (Signed)
[facility] [place] NAME: Emma [name]    MR#:  [id number]  DATE OF BIRTH:  [birth year]  SUBJECTIVE:  CHIEF COMPLAINT:   Chief Complaint  Patient presents with  . Fall   Pain left knee when moved afebrile  REVIEW OF SYSTEMS:    Review of Systems  Respiratory: Negative for cough and sputum production.   Cardiovascular: Negative for chest pain and leg swelling.  Gastrointestinal: Negative for abdominal pain and vomiting.  Genitourinary: Negative for dysuria.  Musculoskeletal: Positive for joint pain.  Skin: Negative for rash.    DRUG ALLERGIES:   Allergies  Allergen Reactions  . Sulfa Antibiotics     Other reaction(s): Kidney Disorder    VITALS:  Blood pressure (!) 165/55, pulse 62, temperature 99.8 F (37.7 C), temperature source Oral, resp. rate 20, height 5\' 2"  (1.575 m), weight 97.2 kg (214 lb 4.8 oz), SpO2 92 %.  PHYSICAL EXAMINATION:   Physical Exam  GENERAL:  75 y.o.-year-old patient lying in the bed with no acute distress.  EYES: Pupils equal, round, reactive to light and accommodation. No scleral icterus. Extraocular muscles intact.  HEENT: Head atraumatic, normocephalic. Oropharynx and nasopharynx clear.  NECK:  Supple, no jugular venous distention. No thyroid enlargement, no tenderness.  LUNGS: Normal breath sounds bilaterally, no wheezing, rales, rhonchi. No use of accessory muscles of respiration.  CARDIOVASCULAR: S1, S2 normal. No murmurs, rubs, or gallops.  ABDOMEN: Soft, nontender, nondistended. Bowel sounds present. No organomegaly or mass.  EXTREMITIES: No cyanosis, clubbing or edema b/l.   Left knee immobilizer NEUROLOGIC: Cranial nerves II through XII are intact. No focal Motor or sensory deficits b/l.   PSYCHIATRIC: The patient is alert and oriented x 3.   LABORATORY PANEL:   CBC  Recent Labs Lab 08/25/15 2005  WBC 10.3  HGB 13.0  HCT 38.3  PLT 179    ------------------------------------------------------------------------------------------------------------------ Chemistries   Recent Labs Lab 08/25/15 2005  NA 140  K 3.8  CL 107  CO2 27  GLUCOSE 109*  BUN 20  CREATININE 0.56  CALCIUM 9.3  AST 26  ALT 19  ALKPHOS 94  BILITOT 1.1   ------------------------------------------------------------------------------------------------------------------  Cardiac Enzymes  Recent Labs Lab 08/25/15 2005  TROPONINI <0.03   ------------------------------------------------------------------------------------------------------------------  RADIOLOGY:  Dg Ankle Complete Left  Result Date: 08/25/2015 CLINICAL DATA:  Left ankle pain status post fall. EXAM: LEFT ANKLE COMPLETE - 3+ VIEW COMPARISON:  None. FINDINGS: Mild osteopenia. There is no evidence of fracture, dislocation, or joint effusion. There is no evidence of significant arthropathy or other focal bone abnormality. Soft tissues demonstrate diffuse ankle swelling. IMPRESSION: No acute fracture or dislocation identified about the left ankle. Electronically Signed   By: Fidela Salisbury M.D.   On: 08/25/2015 22:13  Ct Head Wo Contrast  Result Date: 08/25/2015 CLINICAL DATA:  [place] out of car today. Scalp laceration. Initial encounter. EXAM: CT HEAD WITHOUT CONTRAST TECHNIQUE: Contiguous axial images were obtained from the base of the skull through the vertex without intravenous contrast. COMPARISON:  None. FINDINGS: There is no evidence of acute cortical infarct, intracranial hemorrhage, mass, midline shift, or extra-axial fluid collection. Ventricles and sulci are normal. Deep cerebral white matter hypodensities are nonspecific but compatible with very mild chronic small vessel ischemic disease. A left occipital scalp laceration is noted. Skin staples are in place. Orbits are unremarkable. Left maxillary sinus opacification is partially visualized with partially calcified internal  material and marked osseous sinus wall thickening suggestive of  chronic sinusitis. There is bowing of the medial wall of the sinus into the nasal cavity. The mastoid air cells are clear. Calcified atherosclerosis is noted at the skullbase. No skull fracture is identified. IMPRESSION: 1. No evidence of acute intracranial abnormality. 2. Left occipital scalp laceration. Electronically Signed   By: Logan Bores M.D.   On: 08/25/2015 19:53  Ct Hip Left Wo Contrast  Result Date: 08/25/2015 CLINICAL DATA:  75 year old female with fall and left hip pain. EXAM: CT OF THE LEFT HIP WITHOUT CONTRAST TECHNIQUE: Multidetector CT imaging of the left hip was performed according to the standard protocol. Multiplanar CT image reconstructions were also generated. COMPARISON:  Left hip radiograph dated 08/25/2015 FINDINGS: There is no acute fracture or dislocation. The bones are osteopenic which limits evaluation for fracture. No significant arthritic changes. No fluid collection or hematoma. The soft tissues are grossly unremarkable. IMPRESSION: No acute fracture or dislocation. Electronically Signed   By: Anner Crete M.D.   On: 08/25/2015 23:26  Dg Knee Complete 4 Views Left  Result Date: 08/25/2015 CLINICAL DATA:  Status post fall with left knee pain. EXAM: LEFT KNEE - COMPLETE 4+ VIEW COMPARISON:  None. FINDINGS: There is an incomplete fracture of the posterior medial aspect of the proximal tibia with no significant displacement or angulation. There are advanced osteoarthritic changes of the left knee with 3 compartment joint space narrowing, subchondral sclerosis and osteophyte formation. The changes are worse in the medial compartment. There is a small suprapatellar joint effusion. IMPRESSION: Incomplete fracture of the posterior medial aspect of the proximal tibia without, 4 cm inferior to the knee joint. 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  Electronically Signed   By: Fidela Salisbury M.D.   On: 08/25/2015 22:15  Dg Hip Unilat With Pelvis 2-3 Views Left  Result Date: 08/25/2015 CLINICAL DATA:  Patient' s knee gave while she was getting out of the car. She fell in the driveway and hit her head on the gravel. She is having left hip and leg pain. She is not able to move her leg without pain. She is currently undergoing cancer treatments. Hx breast ca, gerd, htn, mesenteric mass 2016, thyroid disease, hysterectomy EXAM: DG HIP (WITH OR WITHOUT PELVIS) 2-3V LEFT COMPARISON:  None. FINDINGS: No fracture.  No bone lesion. Hip joints, SI joints and symphysis pubis are normally spaced and aligned. Bones are diffusely demineralized. Soft tissues are unremarkable. IMPRESSION: No fracture or significant hip joint abnormality. Electronically Signed   By: Lajean Manes M.D.   On: 08/25/2015 19:48    ASSESSMENT AND PLAN:    Left tibial fracture - nonweightbearing, knee immobilizer, orthopedics consulted and appreciate input. Pain control ordered. SNF would be ideal for this patient but she wants to go home Discussed with daughter regarding plan and HH at discharge    Fall - mechanical fall   Anxiety   HTN (hypertension) - continue home meds   Hypothyroidism - home dose thyroid replacement  All the records are reviewed and case discussed with Care Management/Social Workerr. Management plans discussed with the patient, family and they are in agreement.  CODE STATUS: FULL  DVT Prophylaxis: SCDs  TOTAL TIME TAKING CARE OF THIS PATIENT: 35 minutes.   POSSIBLE D/C IN 1-2 DAYS, DEPENDING ON CLINICAL CONDITION.  Hillary Bow R M.D on 08/27/2015 at 7:14 AM  Between 7am to 6pm - Pager - [phone]  After 6pm go to [email or web address] - password [email or web address] [facility]  [facility]  Office  [phone]  CC: Primary care physician; [name], MD  Note: This dictation was prepared with Dragon dictation along with smaller phrase  technology. Any transcriptional errors that result from this process are unintentional.

## 2015-08-27 NOTE — Progress Notes (Signed)
Physical Therapy Treatment Patient Details Name: Emma [name] MRN: [id number] DOB: [birth year] Today's Date: 08/27/2015    History of Present Illness 75 yo female with fall resulting in non surgical fracture to proximal L tibia, now in immobilizer awaiting orthopedic outpatient consult.      PT Comments    Pt notes concerns about going home in regards to "disrupting everyone's life" and how she will mange at home. Pt notes 6/10 pain in Left lower extremity at rest and increases to 8/10 with very limited movement. Pt does not wish out of bed at this time; also to note, pt is to go home via EMS today. Provided education/performance of exercises for Right lower extremity and passive range of motion to left hip in very limited range in 2 planes. Pt complains of increased pain and wishes no further movement after 5 repetitions each. Discussion with pt regarding the need to perform some movement/exercise in order to prevent increasing stiffness with resultant pain, progress some strength and allow for functional movement. Pt states understanding, but will need encouragement/reinforcement. Pt tolerates increased exercise on Right lower extremity. Pt notes daughter is to come later; offered education/questions answered that family may have at that time if needed. Post session spoke with both nursing and CM regarding discharge planning and family needs. Both note pt will be going home with [facility] and will have home PT involved for pt/family training and education. Recommendation from PT for discharge from the current hospital stay remains skilled nursing facility, as pt shows very limited ability to tolerate exercise due to pain and requires 2+ assist for transfers, as pt unable to stand with left non weightbearing status.    Follow Up Recommendations  SNF     Equipment Recommendations  Wheelchair (measurements PT) [facility])    Recommendations for Other Services       Precautions / Restrictions  Precautions Precautions: Fall Precaution Comments: No ROM to L knee Required Braces or Orthoses: Knee Immobilizer - Left Knee Immobilizer - Left: On at all times Restrictions Weight Bearing Restrictions: Yes LLE Weight Bearing: Non weight bearing    Mobility  Bed Mobility               General bed mobility comments: Not tested; pt does not wish. Plan is to send pt home with family via EMS today. Deferred out of bed due to increased pain with slightest LLE movement  Transfers                    Ambulation/Gait                 Stairs            Wheelchair Mobility    Modified Rankin (Stroke Patients Only)       Balance                                    Cognition Arousal/Alertness: Awake/alert Behavior During Therapy: WFL for tasks assessed/performed (extremely HOH) Overall Cognitive Status: Within Functional Limits for tasks assessed                      Exercises General Exercises - Lower Extremity Ankle Circles/Pumps: AROM;Both;20 reps;Supine Quad Sets: Strengthening;Right;10 reps;Supine Gluteal Sets: Strengthening;Both;10 reps;Supine Short Arc Quad: AROM;Right;10 reps;Supine Heel Slides: AAROM;Right;10 reps;Supine Hip ABduction/ADduction: PROM;Left;Supine;5 reps (very limited range; AAROM R 10x) Straight Leg Raises: PROM;Left;5 reps;Supine (  very limited range of motion, AAROM R 10x)    General Comments        Pertinent Vitals/Pain Pain Assessment: 0-10 Pain Score: 8  (with limited L hip motion; 6 at rest) Pain Location: LLE Pain Descriptors / Indicators: Constant;Aching;Radiating;[place]                      Prior Function            PT Goals (current goals can now be found in the care plan section) Progress towards PT goals: Progressing toward goals (very slowly due to other medical issues/pain)    Frequency  7X/week    PT Plan Current plan remains appropriate     Co-evaluation             End of Session   Activity Tolerance: Patient limited by pain Patient left: in bed;with call bell/phone within reach;with bed alarm set     Time: [id number] PT Time Calculation (min) (ACUTE ONLY): 17 min  Charges:  $Therapeutic Exercise: 8-22 mins                    G Codes:      Charlaine Dalton, PTA 08/27/2015, 12:16 PM

## 2015-08-27 NOTE — Care Management (Addendum)
Spoke with patient for discharge planning. Patient HOH but stated that she can hear me OK. Patient confirms plan to go home with her daughter Emma [name]. Will need BSC and Wheelchair. Confirms plan for home with home health [facility]. Referral placed with Flo Shanks at [place].

## 2015-08-28 ENCOUNTER — Telehealth: Payer: Self-pay

## 2015-08-28 ENCOUNTER — Ambulatory Visit: Payer: Medicare Other

## 2015-08-28 LAB — URINE CULTURE: Culture: >=100000 — AB

## 2015-08-28 NOTE — Telephone Encounter (Signed)
Patient discharged from hospital and need skilled nursing, physical therapy, and home health.  Fax to be sent to office for signature

## 2015-08-28 NOTE — Discharge Summary (Signed)
[employer] [facility] [place] NAME: Emma [name]    MR#:  [id number]  DATE OF BIRTH:  1940/03/20  DATE OF ADMISSION:  08/25/2015 ADMITTING PHYSICIAN: Lance Coon, MD  DATE OF DISCHARGE: 08/27/2015  5:31 PM  PRIMARY CARE PHYSICIAN: [name], MD   ADMISSION DIAGNOSIS:  UTI (lower urinary tract infection) [N39.0] Fall [W19.XXXA] Left leg pain [M79.605] Left hip pain [M25.552] Closed fracture of left proximal tibia [S82.102A] Fall, initial encounter [W19.XXXA] Scalp laceration, initial encounter [S01.01XA] Acute hip pain, left [M25.552]  DISCHARGE DIAGNOSIS:  Principal Problem:   Left tibial fracture Active Problems:   Fall   Anxiety   HTN (hypertension)   Hypothyroidism   SECONDARY DIAGNOSIS:   Past Medical History:  Diagnosis Date  . Breast cancer, left breast ([place]) 06/19/2014  . GERD (gastroesophageal reflux disease)   . Hypertension   . 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 Thyroid disease      ADMITTING HISTORY  Emma [name]  is a 75 y.o. female who presents with A fall and subsequent left tibial fracture. Imaging confirmed proximal incomplete left tibial fracture.  Orthopedics was called and recommended an immobilizer and nonweightbearing with outpatient follow-up. Over, the patient afterwards had very difficult to control pain. She was unable to ambulate with assistive devices, and persistently complained of pain despite treatment. At that point hospitalists were called for admission.  HOSPITAL COURSE:   Left tibial fracture - nonweightbearing for 6 weeks, knee immobilizer, orthopedics consulted and appreciate input. No surgery. Pain control ordered. Prescription given SNF would be ideal for this patient but she wants to go home. Discussed with daughter regarding plan and HH at discharge. Orthopedic follow up as OP  Fall - mechanical fall Anxiety HTN (hypertension) - continue home meds Hypothyroidism - home  dose thyroid replacement  E Coli UTI - Will treat with Keflex  Stable for discharge home with home health  CONSULTS OBTAINED:  Treatment Team:  Eugenie Filler, MD Dereck Leep, MD  DRUG ALLERGIES:   Allergies  Allergen Reactions  . Sulfa Antibiotics     Other reaction(s): Kidney Disorder    DISCHARGE MEDICATIONS:   Discharge Medication List as of 08/27/2015 11:57 AM    START taking these medications   Details  amLODipine (NORVASC) 5 MG tablet Take 1 tablet (5 mg total) by mouth daily., Starting Mon 08/27/2015, Print    cephALEXin (KEFLEX) 500 MG capsule Take 1 capsule (500 mg total) by mouth 2 (two) times daily., Starting Sat 08/25/2015, Print    oxyCODONE (OXY IR/ROXICODONE) 5 MG immediate release tablet Take 1 tablet (5 mg total) by mouth every 6 (six) hours as needed for moderate pain or severe pain., Starting Sun 08/26/2015, Print      CONTINUE these medications which have NOT CHANGED   Details  anastrozole (ARIMIDEX) 1 MG tablet Take 1 mg by mouth daily., Historical Med    ergocalciferol (VITAMIN D2) 50000 units capsule Take 1 capsule (50,000 Units total) by mouth once a week., Starting Mon 05/28/2015, Print    ibuprofen (ADVIL,MOTRIN) 200 MG tablet Take 200 mg by mouth every 6 (six) hours as needed for headache or mild pain. , Historical Med    labetalol (NORMODYNE) 300 MG tablet TAKE 1 TABLET (300 MG TOTAL) BY MOUTH 2 (TWO) TIMES DAILY., Historical Med    levothyroxine (SYNTHROID) 88 MCG tablet Take 88 mcg by mouth daily before breakfast. , Starting Tue 06/20/2014, Historical Med    octreotide (SANDOSTATIN LAR) 30  MG injection Inject 30 mg into the muscle every 28 (twenty-eight) days. , Historical Med        Today   VITAL SIGNS:  Blood pressure (!) 142/53, pulse 62, temperature 98 F (36.7 C), temperature source Oral, resp. rate 20, height 5\' 2"  (1.575 m), weight 97.2 kg (214 lb 4.8 oz), SpO2 92 %.  I/O:  No intake or output data in the 24 hours ending  08/28/15 1413  PHYSICAL EXAMINATION:  Physical Exam  GENERAL:  75 y.o.-year-old patient lying in the bed with no acute distress. Decreased hearing. LUNGS: Normal breath sounds bilaterally, no wheezing, rales,rhonchi or crepitation. No use of accessory muscles of respiration.  CARDIOVASCULAR: S1, S2 normal. No murmurs, rubs, or gallops.  ABDOMEN: Soft, non-tender, non-distended. Bowel sounds present. No organomegaly or mass.  NEUROLOGIC: Moves all 4 extremities. PSYCHIATRIC: The patient is alert and oriented x 3.   Left knee immobilizer  DATA REVIEW:   CBC  Recent Labs Lab 08/25/15 2005  WBC 10.3  HGB 13.0  HCT 38.3  PLT 179    Chemistries   Recent Labs Lab 08/25/15 2005  NA 140  K 3.8  CL 107  CO2 27  GLUCOSE 109*  BUN 20  CREATININE 0.56  CALCIUM 9.3  AST 26  ALT 19  ALKPHOS 94  BILITOT 1.1    Cardiac Enzymes  Recent Labs Lab 08/25/15 2005  TROPONINI <0.03    Microbiology Results  Results for orders placed or performed during the hospital encounter of 08/25/15  Urine culture     Status: Abnormal   Collection Time: 08/25/15  8:37 PM  Result Value Ref Range Status   Specimen Description URINE, RANDOM  Final   Special Requests NONE  Final   Culture >=100,000 COLONIES/mL ESCHERICHIA COLI (A)  Final   Report Status 08/28/2015 FINAL  Final   Organism ID, Bacteria ESCHERICHIA COLI (A)  Final      Susceptibility   Escherichia coli - MIC*    AMPICILLIN <=2 SENSITIVE Sensitive     CEFAZOLIN <=4 SENSITIVE Sensitive     CEFTRIAXONE <=1 SENSITIVE Sensitive     CIPROFLOXACIN <=0.25 SENSITIVE Sensitive     GENTAMICIN <=1 SENSITIVE Sensitive     IMIPENEM <=0.25 SENSITIVE Sensitive     NITROFURANTOIN <=16 SENSITIVE Sensitive     TRIMETH/SULFA <=20 SENSITIVE Sensitive     AMPICILLIN/SULBACTAM <=2 SENSITIVE Sensitive     PIP/TAZO <=4 SENSITIVE Sensitive     Extended ESBL NEGATIVE Sensitive     * >=100,000 COLONIES/mL ESCHERICHIA COLI  Surgical pcr screen      Status: None   Collection Time: 08/26/15  2:38 AM  Result Value Ref Range Status   MRSA, PCR NEGATIVE NEGATIVE Final   Staphylococcus aureus NEGATIVE NEGATIVE Final    Comment:        The Xpert SA Assay (FDA approved for NASAL specimens in patients over 52 years of age), is one component of a comprehensive surveillance program.  Test performance has been validated by [facility] for patients greater than or equal to 2 year old. It is not intended to diagnose infection nor to guide or monitor treatment.     RADIOLOGY:  No results found.  Follow up with PCP in 1 week.  Management plans discussed with the patient, family and they are in agreement.  CODE STATUS:  Code Status History    Date Active Date Inactive Code Status Order ID Comments User Context   08/26/2015  1:23 AM 08/27/2015  8:31  PM Full Code [id number]  Lance Coon, MD ED      TOTAL TIME TAKING CARE OF THIS PATIENT ON DAY OF DISCHARGE: more than 30 minutes.   Hillary Bow R M.D on 08/28/2015 at 2:13 PM  Between 7am to 6pm - Pager - [phone]  After 6pm go to [email or web address] - password [email or web address] [facility]  Office  [phone]  CC: Primary care physician; [name], MD  Note: This dictation was prepared with Dragon dictation along with smaller phrase technology. Any transcriptional errors that result from this process are unintentional.

## 2015-08-28 NOTE — Telephone Encounter (Signed)
Emma [name], this needs to be deferred to either the pcp or her orthopedics.  Chart reviewed the home health is r/t to a fall/fracture.  Dr. Rogue Bussing only manages the patient's carcinoid with sandostatin injection.  Please have care management defer order md for home health arrangements to pcp or orthopedics.

## 2015-09-03 ENCOUNTER — Inpatient Hospital Stay: Payer: Medicare Other | Admitting: Internal Medicine

## 2015-09-03 ENCOUNTER — Inpatient Hospital Stay: Payer: Medicare Other

## 2015-09-06 ENCOUNTER — Other Ambulatory Visit: Payer: Self-pay | Admitting: Nurse Practitioner

## 2015-09-19 ENCOUNTER — Inpatient Hospital Stay: Payer: Medicare Other

## 2015-09-19 ENCOUNTER — Inpatient Hospital Stay: Payer: Medicare Other | Admitting: Internal Medicine

## 2015-10-08 ENCOUNTER — Inpatient Hospital Stay: Payer: Medicare Other

## 2015-10-08 ENCOUNTER — Encounter: Payer: Self-pay | Admitting: Internal Medicine

## 2015-10-08 ENCOUNTER — Inpatient Hospital Stay: Payer: Medicare Other | Attending: Internal Medicine | Admitting: Internal Medicine

## 2015-10-08 DIAGNOSIS — Z86718 Personal history of other venous thrombosis and embolism: Secondary | ICD-10-CM | POA: Diagnosis not present

## 2015-10-08 DIAGNOSIS — K219 Gastro-esophageal reflux disease without esophagitis: Secondary | ICD-10-CM | POA: Insufficient documentation

## 2015-10-08 DIAGNOSIS — Z7901 Long term (current) use of anticoagulants: Secondary | ICD-10-CM

## 2015-10-08 DIAGNOSIS — E041 Nontoxic single thyroid nodule: Secondary | ICD-10-CM | POA: Insufficient documentation

## 2015-10-08 DIAGNOSIS — Z170 Estrogen receptor positive status [ER+]: Secondary | ICD-10-CM

## 2015-10-08 DIAGNOSIS — I10 Essential (primary) hypertension: Secondary | ICD-10-CM | POA: Insufficient documentation

## 2015-10-08 DIAGNOSIS — Q2730 Arteriovenous malformation, site unspecified: Secondary | ICD-10-CM | POA: Diagnosis not present

## 2015-10-08 DIAGNOSIS — D509 Iron deficiency anemia, unspecified: Secondary | ICD-10-CM | POA: Diagnosis not present

## 2015-10-08 DIAGNOSIS — Z79811 Long term (current) use of aromatase inhibitors: Secondary | ICD-10-CM

## 2015-10-08 DIAGNOSIS — Z79899 Other long term (current) drug therapy: Secondary | ICD-10-CM | POA: Diagnosis not present

## 2015-10-08 DIAGNOSIS — K6389 Other specified diseases of intestine: Secondary | ICD-10-CM

## 2015-10-08 DIAGNOSIS — C50912 Malignant neoplasm of unspecified site of left female breast: Secondary | ICD-10-CM | POA: Diagnosis not present

## 2015-10-08 DIAGNOSIS — C7A019 Malignant carcinoid tumor of the small intestine, unspecified portion: Secondary | ICD-10-CM | POA: Diagnosis not present

## 2015-10-08 DIAGNOSIS — K921 Melena: Secondary | ICD-10-CM

## 2015-10-08 DIAGNOSIS — C7A00 Malignant carcinoid tumor of unspecified site: Secondary | ICD-10-CM

## 2015-10-08 LAB — COMPREHENSIVE METABOLIC PANEL
ALK PHOS: 102 U/L (ref 38–126)
ALT: 11 U/L — ABNORMAL LOW (ref 14–54)
AST: 21 U/L (ref 15–41)
Albumin: 3.9 g/dL (ref 3.5–5.0)
Anion gap: 8 (ref 5–15)
BILIRUBIN TOTAL: 0.8 mg/dL (ref 0.3–1.2)
BUN: 17 mg/dL (ref 6–20)
CHLORIDE: 108 mmol/L (ref 101–111)
CO2: 22 mmol/L (ref 22–32)
Calcium: 9.5 mg/dL (ref 8.9–10.3)
Creatinine, Ser: 0.64 mg/dL (ref 0.44–1.00)
GFR calc non Af Amer: >60 mL/min (ref >60)
Glucose, Bld: 116 mg/dL — ABNORMAL HIGH (ref 65–99)
Potassium: 3.3 mmol/L — ABNORMAL LOW (ref 3.5–5.1)
SODIUM: 138 mmol/L (ref 135–145)
TOTAL PROTEIN: 6.8 g/dL (ref 6.5–8.1)

## 2015-10-08 LAB — CBC WITH DIFFERENTIAL/PLATELET
Basophils Absolute: 0.1 10*3/uL (ref 0–0.1)
Basophils Relative: 1 %
EOS ABS: 0.3 10*3/uL (ref 0–0.7)
Eosinophils Relative: 5 %
HCT: 39.3 % (ref 35.0–47.0)
HEMOGLOBIN: 13.2 g/dL (ref 12.0–16.0)
LYMPHS ABS: 0.6 10*3/uL — AB (ref 1.0–3.6)
Lymphocytes Relative: 11 %
MCH: 29.4 pg (ref 26.0–34.0)
MCHC: 33.6 g/dL (ref 32.0–36.0)
MCV: 87.4 fL (ref 80.0–100.0)
Monocytes Absolute: 0.8 10*3/uL (ref 0.2–0.9)
Monocytes Relative: 14 %
NEUTROS PCT: 69 %
Neutro Abs: 3.9 10*3/uL (ref 1.4–6.5)
Platelets: 169 10*3/uL (ref 150–440)
RBC: 4.49 MIL/uL (ref 3.80–5.20)
RDW: 14.1 % (ref 11.5–14.5)
WBC: 5.6 10*3/uL (ref 3.6–11.0)

## 2015-10-08 MED ORDER — OCTREOTIDE ACETATE 20 MG IM KIT
20.0000 mg | PACK | Freq: Once | INTRAMUSCULAR | Status: AC
  Administered 2015-10-08: 20 mg via INTRAMUSCULAR
  Filled 2015-10-08: qty 1

## 2015-10-08 NOTE — Assessment & Plan Note (Signed)
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 Octreotide scan from Feb 2017 no evidence of progression. Missed scan sec to fall/ fracture.   # Continue monthly Octreotide.  Repeat  Octreotide scan in prior to next visit.   # Iron deficiency anemia/Hx of AV malformation- Status post Feraheme April 2017- today hemoglobin is 13.  # Chronic Left LE DVT [sep 4th US]- continue eleiquis 2.5 mg/prophylaxis.   # LEFT BREAST CA STAGE I;  Restart Arimidex.  Clinically no evidence of recurrence.  # Black stool- Hb stable at 13; check stool cards [x2]   Follow-up with me in approximately 1 months;  CBC BMP monthly.  Scans prior to revisit.

## 2015-10-08 NOTE — Progress Notes (Signed)
Two Ultrasounds to left leg for blood clot, xray, and MRI.  Pt reports having a fall that resulted in a fracture.  Slipped getting out of car.  SOB on exertion and sometimes wakes at night SOB.  Pt reports since starting Eliquis has had dark stools and reports it as blood.  Son in law reports that there is a history of dark stools with no visible bleeding. Unaware if reported to prescriber.  Pt reports two blood clots are in left leg.  Last Mammo in May.

## 2015-10-08 NOTE — Progress Notes (Signed)
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] s/p Lumpec & RT; ER/PR > 90%; Her2 Neu-NEG; Arimidex; MAY 2016- Mammo-NEG; May 1st HOLD Arimidex sec to myalgias  # IDA s/p IV iron; last March 2014; [place] [Dr.Elliot; Jan 2016] colon angiotele- s/p Argon laser; April 2017- Ferrahem  # Thyroid nodule- MNG s/p Bx [NOV 2016]/ right adnexal mass [stable since 2010]; hard of hearing     Carcinoid tumor of small intestine, malignant ([place])   10/08/2015 Initial Diagnosis    Carcinoid tumor of small intestine, malignant ([facility])       INTERVAL HISTORY:  A very pleasant 75 year old female patient with above history of breast cancer and also mesenteric mass/small bowel carcinoid is here for follow-up.   Interim fell- s/p fall ORIF; also found to have a DVT of the left lower extremity- [ultrasound left lower extremity September 8 at [facility]-chronic DVT] DVT on Eliquis 2.5 mg prophylaxis dose.  She notes to have dark colored stools. She is not on iron. No nausea no vomiting. No abdominal pain. No diarrhea. No weight loss.  REVIEW OF SYSTEMS:  A complete 10 point review of system is done which is negative except mentioned above/history of present illness.   PAST MEDICAL HISTORY :  Past Medical History:  Diagnosis Date  . Breast cancer, left breast ([place]) 06/19/2014  . GERD (gastroesophageal reflux disease)   . Hypertension   . 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 Thyroid disease     PAST SURGICAL HISTORY :   Past Surgical History:  Procedure Laterality Date  . ABDOMINAL HYSTERECTOMY     partial  . BREAST EXCISIONAL BIOPSY Left  12/2012   +  . BREAST LUMPECTOMY WITH SENTINEL LYMPH NODE BIOPSY Left 2014  . CHOLECYSTECTOMY      FAMILY HISTORY :   Family History  Problem Relation Age of Onset  . Breast cancer Mother 70  . Breast cancer Maternal Aunt 68    SOCIAL HISTORY:   Social History  Substance Use Topics  . Smoking status: Never Smoker  . Smokeless tobacco: Never Used  . Alcohol use No    ALLERGIES:  is allergic to sulfa antibiotics.  MEDICATIONS:  Current Outpatient Prescriptions  Medication Sig Dispense Refill  . anastrozole (ARIMIDEX) 1 MG tablet Take 1 mg by mouth daily.    Marland Kitchen apixaban (ELIQUIS) 2.5 MG TABS tablet Take 2.5 mg by mouth.    . ergocalciferol (VITAMIN D2) 50000 units capsule Take 1 capsule (50,000 Units total) by mouth once a week. 12 capsule 1  . labetalol (NORMODYNE) 300 MG tablet TAKE 1 TABLET (300 MG TOTAL) BY MOUTH 2 (TWO) TIMES DAILY.    Marland Kitchen levothyroxine (SYNTHROID) 88 MCG tablet Take 88 mcg by mouth daily before breakfast.     . octreotide (SANDOSTATIN LAR) 30 MG injection Inject 30 mg into the muscle every 28 (twenty-eight) days.      No current facility-administered medications for this visit.     PHYSICAL EXAMINATION: ECOG PERFORMANCE STATUS: 0 - Asymptomatic  BP (!) 163/84 (BP Location: Right Arm, Patient Position: Sitting)   Pulse (!) 52   Temp (!) 96 F (35.6 C) (Tympanic)  Ht _0  (1.575 m)   Wt 210 lb 3.2 oz (95.3 kg)   BMI 38.45 kg/m   Filed Weights   10/08/15 0952  Weight: 210 lb 3.2 oz (95.3 kg)   GENERAL: Well-nourished well-developed; Alert, no distress and comfortable. Alone; hard of hearing.She is in a wheelchair. Alone.  EYES: no pallor or icterus OROPHARYNX: no thrush or ulceration; good dentition  NECK: supple, no masses felt LYMPH: no palpable lymphadenopathy in the cervical, axillary or inguinal regions LUNGS: clear to auscultation and No wheeze or crackles HEART/CVS: regular rate & rhythm and no murmurs; No lower extremity edema;  left foot in a brace. ABDOMEN:abdomen soft, non-tender and normal bowel sounds Musculoskeletal:no cyanosis of digits and no clubbing  PSYCH: alert & oriented x 3 with fluent speech NEURO: no focal motor/sensory deficits SKIN: no rashes or significant lesions  LABORATORY DATA:  I have reviewed the data as listed    Component Value Date/Time   NA 138 10/08/2015 0931   NA 137 07/12/2013 0841   K 3.3 (L) 10/08/2015 0931   K 4.7 07/12/2013 0841   CL 108 10/08/2015 0931   CL 107 07/12/2013 0841   CO2 22 10/08/2015 0931   CO2 20 (L) 07/12/2013 0841   GLUCOSE 116 (H) 10/08/2015 0931   GLUCOSE 122 (H) 07/12/2013 0841   BUN 17 10/08/2015 0931   BUN 18 07/12/2013 0841   CREATININE 0.64 10/08/2015 0931   CREATININE 0.64 05/22/2014 1111   CALCIUM 9.5 10/08/2015 0931   CALCIUM 9.2 07/12/2013 0841   PROT 6.8 10/08/2015 0931   PROT 7.0 05/22/2014 1111   ALBUMIN 3.9 10/08/2015 0931   ALBUMIN 4.2 05/22/2014 1111   AST 21 10/08/2015 0931   AST 29 05/22/2014 1111   ALT 11 (L) 10/08/2015 0931   ALT 17 05/22/2014 1111   ALKPHOS 102 10/08/2015 0931   ALKPHOS 95 05/22/2014 1111   BILITOT 0.8 10/08/2015 0931   BILITOT 0.8 05/22/2014 1111   GFRNONAA >60 10/08/2015 0931   GFRNONAA >60 05/22/2014 1111   GFRAA >60 10/08/2015 0931   GFRAA >60 05/22/2014 1111    No results found for: SPEP, UPEP  Lab Results  Component Value Date   WBC 5.6 10/08/2015   NEUTROABS 3.9 10/08/2015   HGB 13.2 10/08/2015   HCT 39.3 10/08/2015   MCV 87.4 10/08/2015   PLT 169 10/08/2015      Chemistry      Component Value Date/Time   NA 138 10/08/2015 0931   NA 137 07/12/2013 0841   K 3.3 (L) 10/08/2015 0931   K 4.7 07/12/2013 0841   CL 108 10/08/2015 0931   CL 107 07/12/2013 0841   CO2 22 10/08/2015 0931   CO2 20 (L) 07/12/2013 0841   BUN 17 10/08/2015 0931   BUN 18 07/12/2013 0841   CREATININE 0.64 10/08/2015 0931   CREATININE 0.64 05/22/2014 1111      Component Value Date/Time   CALCIUM 9.5  10/08/2015 0931   CALCIUM 9.2 07/12/2013 0841   ALKPHOS 102 10/08/2015 0931   ALKPHOS 95 05/22/2014 1111   AST 21 10/08/2015 0931   AST 29 05/22/2014 1111   ALT 11 (L) 10/08/2015 0931   ALT 17 05/22/2014 1111   BILITOT 0.8 10/08/2015 0931   BILITOT 0.8 05/22/2014 1111     Final Interpretation Right There is no evidence of DVT in the lower extremity. There is no evidence of obstruction proximal to the inguinal ligament or in the common femoral vein. Left Abnormalities  consistent with the sequela of a prior venous obstructive process, with findings that appear chronic/long standing in nature are identified in the intramuscular calf veins. Portions of this examination were limited- see technologist comments above.  Electronically signed by [id number] on 10/04/2015 at 1:20:47 PM.  ASSESSMENT & PLAN:  Carcinoid tumor of small intestine, malignant ([place]) # 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 Octreotide scan from Feb 2017 no evidence of progression. Missed scan sec to fall/ fracture.   # Continue monthly Octreotide.  Repeat  Octreotide scan in prior to next visit.   # Iron deficiency anemia/Hx of AV malformation- Status post Feraheme April 2017- today hemoglobin is 13.  # Chronic Left LE DVT [sep 4th US]- continue eleiquis 2.5 mg/prophylaxis.   # LEFT BREAST CA STAGE I;  Restart Arimidex.  Clinically no evidence of recurrence.  # Black stool- Hb stable at 13; check stool cards [x2]   Follow-up with me in approximately 1 months;  CBC BMP monthly.  Scans prior to revisit.      [name], MD 10/08/2015 3:31 PM

## 2015-10-29 ENCOUNTER — Encounter
Admission: RE | Admit: 2015-10-29 | Discharge: 2015-10-29 | Disposition: A | Discharge status: Home / Self Care | Payer: Medicare Other | Source: Ambulatory Visit | Attending: Internal Medicine | Admitting: Internal Medicine

## 2015-10-29 DIAGNOSIS — C7A00 Malignant carcinoid tumor of unspecified site: Secondary | ICD-10-CM | POA: Insufficient documentation

## 2015-10-29 MED ORDER — INDIUM IN-111 PENTETREOTIDE IV KIT
6.7000 | PACK | Freq: Once | INTRAVENOUS | Status: AC | PRN
  Administered 2015-10-29: 6.7 via INTRAVENOUS

## 2015-10-30 ENCOUNTER — Encounter
Admission: RE | Admit: 2015-10-30 | Discharge: 2015-10-30 | Disposition: A | Discharge status: Home / Self Care | Payer: Medicare Other | Source: Ambulatory Visit | Attending: Internal Medicine | Admitting: Internal Medicine

## 2015-10-31 ENCOUNTER — Encounter
Admission: RE | Admit: 2015-10-31 | Discharge: 2015-10-31 | Disposition: A | Discharge status: Home / Self Care | Payer: Medicare Other | Source: Ambulatory Visit | Attending: Internal Medicine | Admitting: Internal Medicine

## 2015-10-31 DIAGNOSIS — C7A00 Malignant carcinoid tumor of unspecified site: Secondary | ICD-10-CM | POA: Diagnosis present

## 2015-11-05 ENCOUNTER — Inpatient Hospital Stay: Payer: Medicare Other

## 2015-11-05 ENCOUNTER — Other Ambulatory Visit: Payer: Self-pay

## 2015-11-05 ENCOUNTER — Encounter: Payer: Self-pay | Admitting: Internal Medicine

## 2015-11-05 ENCOUNTER — Inpatient Hospital Stay: Payer: Medicare Other | Attending: Internal Medicine | Admitting: Internal Medicine

## 2015-11-05 VITALS — BP 166/84 | HR 54 | Temp 96.8°F | Resp 19 | Ht 62.0 in | Wt 202.8 lb

## 2015-11-05 DIAGNOSIS — Z86718 Personal history of other venous thrombosis and embolism: Secondary | ICD-10-CM | POA: Diagnosis not present

## 2015-11-05 DIAGNOSIS — Z79899 Other long term (current) drug therapy: Secondary | ICD-10-CM | POA: Diagnosis not present

## 2015-11-05 DIAGNOSIS — Z7901 Long term (current) use of anticoagulants: Secondary | ICD-10-CM | POA: Diagnosis not present

## 2015-11-05 DIAGNOSIS — K219 Gastro-esophageal reflux disease without esophagitis: Secondary | ICD-10-CM | POA: Insufficient documentation

## 2015-11-05 DIAGNOSIS — Z170 Estrogen receptor positive status [ER+]: Secondary | ICD-10-CM | POA: Diagnosis not present

## 2015-11-05 DIAGNOSIS — Z79811 Long term (current) use of aromatase inhibitors: Secondary | ICD-10-CM

## 2015-11-05 DIAGNOSIS — R197 Diarrhea, unspecified: Secondary | ICD-10-CM

## 2015-11-05 DIAGNOSIS — C50912 Malignant neoplasm of unspecified site of left female breast: Secondary | ICD-10-CM | POA: Diagnosis not present

## 2015-11-05 DIAGNOSIS — I10 Essential (primary) hypertension: Secondary | ICD-10-CM | POA: Diagnosis not present

## 2015-11-05 DIAGNOSIS — C7A019 Malignant carcinoid tumor of the small intestine, unspecified portion: Secondary | ICD-10-CM

## 2015-11-05 DIAGNOSIS — Q2730 Arteriovenous malformation, site unspecified: Secondary | ICD-10-CM | POA: Diagnosis not present

## 2015-11-05 DIAGNOSIS — D509 Iron deficiency anemia, unspecified: Secondary | ICD-10-CM

## 2015-11-05 DIAGNOSIS — K6389 Other specified diseases of intestine: Secondary | ICD-10-CM

## 2015-11-05 LAB — COMPREHENSIVE METABOLIC PANEL
ALK PHOS: 81 U/L (ref 38–126)
ALT: 10 U/L — ABNORMAL LOW (ref 14–54)
ANION GAP: 6 (ref 5–15)
AST: 24 U/L (ref 15–41)
Albumin: 3.7 g/dL (ref 3.5–5.0)
BILIRUBIN TOTAL: 1.1 mg/dL (ref 0.3–1.2)
BUN: 17 mg/dL (ref 6–20)
CALCIUM: 9.3 mg/dL (ref 8.9–10.3)
CO2: 28 mmol/L (ref 22–32)
Chloride: 106 mmol/L (ref 101–111)
Creatinine, Ser: 0.7 mg/dL (ref 0.44–1.00)
Glucose, Bld: 118 mg/dL — ABNORMAL HIGH (ref 65–99)
Potassium: 3.4 mmol/L — ABNORMAL LOW (ref 3.5–5.1)
SODIUM: 140 mmol/L (ref 135–145)
TOTAL PROTEIN: 6.6 g/dL (ref 6.5–8.1)

## 2015-11-05 LAB — CBC WITH DIFFERENTIAL/PLATELET
Basophils Absolute: 0.1 10*3/uL (ref 0–0.1)
Basophils Relative: 1 %
EOS ABS: 0.3 10*3/uL (ref 0–0.7)
Eosinophils Relative: 5 %
HCT: 38.3 % (ref 35.0–47.0)
HEMOGLOBIN: 12.8 g/dL (ref 12.0–16.0)
LYMPHS ABS: 0.5 10*3/uL — AB (ref 1.0–3.6)
LYMPHS PCT: 9 %
MCH: 28.7 pg (ref 26.0–34.0)
MCHC: 33.5 g/dL (ref 32.0–36.0)
MCV: 85.8 fL (ref 80.0–100.0)
MONOS PCT: 12 %
Monocytes Absolute: 0.7 10*3/uL (ref 0.2–0.9)
NEUTROS PCT: 73 %
Neutro Abs: 4 10*3/uL (ref 1.4–6.5)
Platelets: 170 10*3/uL (ref 150–440)
RBC: 4.46 MIL/uL (ref 3.80–5.20)
RDW: 14.5 % (ref 11.5–14.5)
WBC: 5.5 10*3/uL (ref 3.6–11.0)

## 2015-11-05 MED ORDER — ERGOCALCIFEROL 1.25 MG (50000 UT) PO CAPS: 50000.0000 [IU] | ORAL_CAPSULE | ORAL | 1 refills | Status: DC

## 2015-11-05 MED ORDER — ANASTROZOLE 1 MG PO TABS: 1.0000 mg | ORAL_TABLET | Freq: Every day | ORAL | 6 refills | Status: DC

## 2015-11-05 MED ORDER — OCTREOTIDE ACETATE 20 MG IM KIT
20.0000 mg | PACK | Freq: Once | INTRAMUSCULAR | Status: AC
  Administered 2015-11-05: 20 mg via INTRAMUSCULAR
  Filled 2015-11-05: qty 1

## 2015-11-05 NOTE — Progress Notes (Signed)
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] s/p Lumpec & RT; ER/PR > 90%; Her2 Neu-NEG; Arimidex; MAY 2016- Mammo-NEG; May 1st HOLD Arimidex sec to myalgias  # IDA s/p IV iron; last March 2014; [place] [Dr.Elliot; Jan 2016] colon angiotele- s/p Argon laser; April 2017- Ferrahem  # Thyroid nodule- MNG s/p Bx [NOV 2016]/ right adnexal mass [stable since 2010]; hard of hearing     Carcinoid tumor of small intestine, malignant ([facility])   10/08/2015 Initial Diagnosis    Carcinoid tumor of small intestine, malignant ([facility])       INTERVAL HISTORY:  A very pleasant 75-year-old female patient with above history of breast cancer and also mesenteric mass/small bowel carcinoid is here for follow-up/ To review the results of the Octreoscan.  Patient continues to have intermittent diarrhea chronic. Not any worse. Denies any blood in stools or black colored stools. Chronic mild fatigue.  No nausea no vomiting. No abdominal pain. No diarrhea. No weight loss.  REVIEW OF SYSTEMS:  A complete 10 point review of system is done which is negative except mentioned above/history of present illness.   PAST MEDICAL HISTORY :  Past Medical History:  Diagnosis Date  . Aromatase inhibitor use   . Breast cancer, left breast ([facility]) 06/19/2014  . GERD (gastroesophageal reflux disease)   . Hypertension   . 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 Thyroid disease     PAST SURGICAL HISTORY :   Past Surgical History:  Procedure Laterality Date  . ABDOMINAL HYSTERECTOMY     partial  . BREAST EXCISIONAL BIOPSY Left  12/2012   +  . BREAST LUMPECTOMY WITH SENTINEL LYMPH NODE BIOPSY Left 2014  . CHOLECYSTECTOMY      FAMILY HISTORY :   Family History  Problem Relation Age of Onset  . Breast cancer Mother 70  . Breast cancer Maternal Aunt 68    SOCIAL HISTORY:   Social History  Substance Use Topics  . Smoking status: Never Smoker  . Smokeless tobacco: Never Used  . Alcohol use No    ALLERGIES:  is allergic to sulfa antibiotics.  MEDICATIONS:  Current Outpatient Prescriptions  Medication Sig Dispense Refill  . labetalol (NORMODYNE) 300 MG tablet TAKE 1 TABLET (300 MG TOTAL) BY MOUTH 2 (TWO) TIMES DAILY.    . levothyroxine (SYNTHROID) 88 MCG tablet Take 88 mcg by mouth daily before breakfast.     . octreotide (SANDOSTATIN LAR) 30 MG injection Inject 30 mg into the muscle every 28 (twenty-eight) days.     . anastrozole (ARIMIDEX) 1 MG tablet Take 1 tablet (1 mg total) by mouth daily. 30 tablet 6  . apixaban (ELIQUIS) 2.5 MG TABS tablet Take 2.5 mg by mouth.    . ergocalciferol (VITAMIN D2) 50000 units capsule Take 1 capsule (50,000 Units total) by mouth once a week. 12 capsule 1   No current facility-administered medications for this visit.     PHYSICAL EXAMINATION: ECOG PERFORMANCE STATUS: 0 - Asymptomatic  BP (!) 166/84 (BP Location: Right Arm, Patient Position: Sitting)   Pulse (!) 54   Temp (!) 96.8 F (36 C) (Tympanic)     Resp 19   Ht 5' 2" (1.575 m)   Wt 202 lb 12.8 oz (92 kg)   BMI 37.09 kg/m   Filed Weights   11/05/15 1108  Weight: 202 lb 12.8 oz (92 kg)   GENERAL: Well-nourished well-developed; Alert, no distress and comfortable. Alone; hard of hearing.She is in a wheelchair. Alone.  EYES: no pallor or icterus OROPHARYNX: no thrush or ulceration; good dentition  NECK: supple, no masses felt LYMPH: no palpable lymphadenopathy in the cervical, axillary or inguinal regions LUNGS: clear to auscultation and No wheeze or crackles HEART/CVS: regular rate & rhythm and no  murmurs; No lower extremity edema; left foot in a brace. ABDOMEN:abdomen soft, non-tender and normal bowel sounds Musculoskeletal:no cyanosis of digits and no clubbing  PSYCH: alert & oriented x 3 with fluent speech NEURO: no focal motor/sensory deficits SKIN: no rashes or significant lesions  LABORATORY DATA:  I have reviewed the data as listed    Component Value Date/Time   NA 140 11/05/2015 1049   NA 137 07/12/2013 0841   K 3.4 (L) 11/05/2015 1049   K 4.7 07/12/2013 0841   CL 106 11/05/2015 1049   CL 107 07/12/2013 0841   CO2 28 11/05/2015 1049   CO2 20 (L) 07/12/2013 0841   GLUCOSE 118 (H) 11/05/2015 1049   GLUCOSE 122 (H) 07/12/2013 0841   BUN 17 11/05/2015 1049   BUN 18 07/12/2013 0841   CREATININE 0.70 11/05/2015 1049   CREATININE 0.64 05/22/2014 1111   CALCIUM 9.3 11/05/2015 1049   CALCIUM 9.2 07/12/2013 0841   PROT 6.6 11/05/2015 1049   PROT 7.0 05/22/2014 1111   ALBUMIN 3.7 11/05/2015 1049   ALBUMIN 4.2 05/22/2014 1111   AST 24 11/05/2015 1049   AST 29 05/22/2014 1111   ALT 10 (L) 11/05/2015 1049   ALT 17 05/22/2014 1111   ALKPHOS 81 11/05/2015 1049   ALKPHOS 95 05/22/2014 1111   BILITOT 1.1 11/05/2015 1049   BILITOT 0.8 05/22/2014 1111   GFRNONAA >60 11/05/2015 1049   GFRNONAA >60 05/22/2014 1111   GFRAA >60 11/05/2015 1049   GFRAA >60 05/22/2014 1111    No results found for: SPEP, UPEP  Lab Results  Component Value Date   WBC 5.5 11/05/2015   NEUTROABS 4.0 11/05/2015   HGB 12.8 11/05/2015   HCT 38.3 11/05/2015   MCV 85.8 11/05/2015   PLT 170 11/05/2015      Chemistry      Component Value Date/Time   NA 140 11/05/2015 1049   NA 137 07/12/2013 0841   K 3.4 (L) 11/05/2015 1049   K 4.7 07/12/2013 0841   CL 106 11/05/2015 1049   CL 107 07/12/2013 0841   CO2 28 11/05/2015 1049   CO2 20 (L) 07/12/2013 0841   BUN 17 11/05/2015 1049   BUN 18 07/12/2013 0841   CREATININE 0.70 11/05/2015 1049   CREATININE 0.64 05/22/2014 1111      Component  Value Date/Time   CALCIUM 9.3 11/05/2015 1049   CALCIUM 9.2 07/12/2013 0841   ALKPHOS 81 11/05/2015 1049   ALKPHOS 95 05/22/2014 1111   AST 24 11/05/2015 1049   AST 29 05/22/2014 1111   ALT 10 (L) 11/05/2015 1049   ALT 17 05/22/2014 1111   BILITOT 1.1 11/05/2015 1049   BILITOT 0.8 05/22/2014 1111     IMPRESSION: Again identified increased tracer localization within a mesenteric mass in the RIGHT mid abdomen compatible with carcinoid tumor, mass minimally smaller than on previous exam.  Grossly   unchanged metastatic focus LEFT lobe liver.  Grossly stable omental tumor implants in the infraumbilical region.  Focus of subtle small-bowel localization of tracer in RIGHT mid abdomen anterior to mesenteric mass question tumor versus serosal implant.  Small RIGHT renal cyst and nonobstructing RIGHT renal calculus.  Enlarged RIGHT ovary for age, unchanged.  Stable complex partially calcified cystic lesion in spleen.   Electronically Signed   By: Mark  Boles M.D.   On: 10/31/2015 16:11    ASSESSMENT & PLAN:  Carcinoid tumor of small intestine, malignant ([facility]) # 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 Octreotide scan from OCT 2nd  2017 no evidence of progression.   # Diarrhea- ? Sec to carcinoid; recommend Urine 5 HIAA.   # Iron deficiency anemia/Hx of AV malformation- Status post Feraheme April 2017- today hemoglobin is 13.   # Chronic Left LE DVT [sep 4th US]- continue eleiquis 2.5 mg/prophylaxis.   # LEFT BREAST CA STAGE I;  Restart Arimidex.  Clinically no evidence of recurrence. New prescription given.   Follow-up with me in approximately 2 month/labs;  CBC BMP monthly/shots.         R , MD 11/07/2015 8:07 AM 

## 2015-11-05 NOTE — Progress Notes (Signed)
Will need refill on anstrozole and vitamin.  Has some pain in right klower quadrant that comes and goes.  Pt reports she always has diarreah 4-5 times a day

## 2015-11-05 NOTE — Assessment & Plan Note (Addendum)
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 Octreotide scan from OCT 2nd  2017 no evidence of progression.   # Diarrhea- ? Sec to carcinoid; recommend Urine 5 HIAA.   # Iron deficiency anemia/Hx of AV malformation- Status post Feraheme April 2017- today hemoglobin is 13.   # Chronic Left LE DVT [sep 4th US]- continue eleiquis 2.5 mg/prophylaxis.   # LEFT BREAST CA STAGE I;  Restart Arimidex.  Clinically no evidence of recurrence. New prescription given.   Follow-up with me in approximately 2 month/labs;  CBC BMP monthly/shots.

## 2015-11-06 ENCOUNTER — Telehealth: Payer: Self-pay | Admitting: *Deleted

## 2015-11-06 DIAGNOSIS — Z79811 Long term (current) use of aromatase inhibitors: Secondary | ICD-10-CM

## 2015-11-06 MED ORDER — ERGOCALCIFEROL 1.25 MG (50000 UT) PO CAPS: 50000.0000 [IU] | ORAL_CAPSULE | ORAL | 1 refills | Status: DC

## 2015-11-06 MED ORDER — ANASTROZOLE 1 MG PO TABS: 1.0000 mg | ORAL_TABLET | Freq: Every day | ORAL | 6 refills | Status: DC

## 2015-11-06 NOTE — Telephone Encounter (Signed)
Pharmacy did not get 2 rx that was to be sent. I returned call and told her that 2 rx were sent to [facility] and receipt was confirmed. She apologized and said her mother forget to tell us she changed pharmacies to [facility] on [address] and asked it be sent there. I called [facility] to inform that she does not want rx from there and please disregard them and then escribed them to [facility]

## 2015-11-26 ENCOUNTER — Encounter: Payer: Self-pay | Admitting: Radiation Oncology

## 2015-11-26 ENCOUNTER — Ambulatory Visit
Admission: RE | Admit: 2015-11-26 | Discharge: 2015-11-26 | Disposition: A | Discharge status: Home / Self Care | Payer: Medicare Other | Source: Ambulatory Visit | Attending: Radiation Oncology | Admitting: Radiation Oncology

## 2015-11-26 VITALS — BP 187/78 | HR 62 | Temp 98.0°F | Resp 20

## 2015-11-26 DIAGNOSIS — R932 Abnormal findings on diagnostic imaging of liver and biliary tract: Secondary | ICD-10-CM | POA: Diagnosis not present

## 2015-11-26 DIAGNOSIS — C50912 Malignant neoplasm of unspecified site of left female breast: Secondary | ICD-10-CM | POA: Insufficient documentation

## 2015-11-26 DIAGNOSIS — Z79811 Long term (current) use of aromatase inhibitors: Secondary | ICD-10-CM | POA: Diagnosis not present

## 2015-11-26 DIAGNOSIS — Z923 Personal history of irradiation: Secondary | ICD-10-CM | POA: Diagnosis not present

## 2015-11-26 DIAGNOSIS — Z170 Estrogen receptor positive status [ER+]: Secondary | ICD-10-CM | POA: Diagnosis not present

## 2015-11-26 NOTE — Progress Notes (Signed)
Radiation Oncology Follow up Note  Name: Emma [name]   Date:   11/26/2015 MRN:  [id number] DOB: 07/18/1940    This 75 y.o. female presents to the clinic today for to a half year follow-up for left breast cancer stage I.  REFERRING PROVIDER: [name], MD  HPI: Patient is a 75 year old female now out to have years having completed whole breast radiation for stage I invasive mammary carcinoma of the left breast. She's currently on Arimidex tolerate that well. She does have presumed mesenteric carcinoid with imaging suggestive of metastasis to the liver and is on.octreotide. Her last mammogram was back in May 2017 BI-RADS 2 benign she specifically denies breast tenderness cough or bone pain.  COMPLICATIONS OF TREATMENT: none  FOLLOW UP COMPLIANCE: keeps appointments   PHYSICAL EXAM:  BP (!) 187/78   Pulse 62   Temp 98 F (36.7 C)   Resp 20  Lungs are clear to A&P cardiac examination essentially unremarkable with regular rate and rhythm. No dominant mass or nodularity is noted in either breast in 2 positions examined. Incision is well-healed. No axillary or supraclavicular adenopathy is appreciated. Cosmetic result is excellent. Well-developed well-nourished patient in NAD. HEENT reveals PERLA, EOMI, discs not visualized.  Oral cavity is clear. No oral mucosal lesions are identified. Neck is clear without evidence of cervical or supraclavicular adenopathy. Lungs are clear to A&P. Cardiac examination is essentially unremarkable with regular rate and rhythm without murmur rub or thrill. Abdomen is benign with no organomegaly or masses noted. Motor sensory and DTR levels are equal and symmetric in the upper and lower extremities. Cranial nerves II through XII are grossly intact. Proprioception is intact. No peripheral adenopathy or edema is identified. No motor or sensory levels are noted. Crude visual fields are within normal range.  RADIOLOGY RESULTS: Last mammogram reviewed and  compatible with the above-stated findings  PLAN: Present time she is under the care of medical oncology for possible stage IV carcinoid. From a breast standpoint she has no evidence of disease. I have asked to see her back in 1 year for follow-up. Patient knows to call sooner with any concerns. She continues on aromatase.  I would like to take this opportunity to thank you for allowing me to participate in the care of your patient.Armstead Peaks., MD

## 2015-12-06 ENCOUNTER — Inpatient Hospital Stay: Payer: Medicare Other

## 2015-12-06 ENCOUNTER — Inpatient Hospital Stay: Payer: Medicare Other | Attending: Internal Medicine

## 2015-12-06 DIAGNOSIS — D509 Iron deficiency anemia, unspecified: Secondary | ICD-10-CM | POA: Insufficient documentation

## 2015-12-06 DIAGNOSIS — Z79899 Other long term (current) drug therapy: Secondary | ICD-10-CM | POA: Insufficient documentation

## 2015-12-06 DIAGNOSIS — Z170 Estrogen receptor positive status [ER+]: Secondary | ICD-10-CM | POA: Diagnosis not present

## 2015-12-06 DIAGNOSIS — C50912 Malignant neoplasm of unspecified site of left female breast: Secondary | ICD-10-CM | POA: Insufficient documentation

## 2015-12-06 DIAGNOSIS — Z7901 Long term (current) use of anticoagulants: Secondary | ICD-10-CM | POA: Insufficient documentation

## 2015-12-06 DIAGNOSIS — R197 Diarrhea, unspecified: Secondary | ICD-10-CM | POA: Diagnosis not present

## 2015-12-06 DIAGNOSIS — K6389 Other specified diseases of intestine: Secondary | ICD-10-CM

## 2015-12-06 DIAGNOSIS — Z86718 Personal history of other venous thrombosis and embolism: Secondary | ICD-10-CM | POA: Diagnosis not present

## 2015-12-06 DIAGNOSIS — I10 Essential (primary) hypertension: Secondary | ICD-10-CM | POA: Diagnosis not present

## 2015-12-06 DIAGNOSIS — Q2730 Arteriovenous malformation, site unspecified: Secondary | ICD-10-CM | POA: Diagnosis not present

## 2015-12-06 DIAGNOSIS — C7A019 Malignant carcinoid tumor of the small intestine, unspecified portion: Secondary | ICD-10-CM | POA: Insufficient documentation

## 2015-12-06 DIAGNOSIS — K219 Gastro-esophageal reflux disease without esophagitis: Secondary | ICD-10-CM | POA: Insufficient documentation

## 2015-12-06 DIAGNOSIS — Z79811 Long term (current) use of aromatase inhibitors: Secondary | ICD-10-CM | POA: Insufficient documentation

## 2015-12-06 LAB — CBC WITH DIFFERENTIAL/PLATELET
BASOS ABS: 0.1 10*3/uL (ref 0–0.1)
BASOS PCT: 1 %
EOS ABS: 0.3 10*3/uL (ref 0–0.7)
Eosinophils Relative: 5 %
HCT: 37.6 % (ref 35.0–47.0)
HEMOGLOBIN: 12.5 g/dL (ref 12.0–16.0)
LYMPHS ABS: 0.5 10*3/uL — AB (ref 1.0–3.6)
Lymphocytes Relative: 10 %
MCH: 28.3 pg (ref 26.0–34.0)
MCHC: 33.3 g/dL (ref 32.0–36.0)
MCV: 85.1 fL (ref 80.0–100.0)
Monocytes Absolute: 0.7 10*3/uL (ref 0.2–0.9)
Monocytes Relative: 14 %
NEUTROS PCT: 70 %
Neutro Abs: 3.7 10*3/uL (ref 1.4–6.5)
Platelets: 166 10*3/uL (ref 150–440)
RBC: 4.42 MIL/uL (ref 3.80–5.20)
RDW: 15 % — ABNORMAL HIGH (ref 11.5–14.5)
WBC: 5.2 10*3/uL (ref 3.6–11.0)

## 2015-12-06 LAB — COMPREHENSIVE METABOLIC PANEL
ALBUMIN: 3.8 g/dL (ref 3.5–5.0)
ALK PHOS: 87 U/L (ref 38–126)
ALT: 13 U/L — AB (ref 14–54)
AST: 24 U/L (ref 15–41)
Anion gap: 5 (ref 5–15)
BUN: 11 mg/dL (ref 6–20)
CALCIUM: 9.1 mg/dL (ref 8.9–10.3)
CHLORIDE: 103 mmol/L (ref 101–111)
CO2: 30 mmol/L (ref 22–32)
CREATININE: 0.75 mg/dL (ref 0.44–1.00)
GFR calc Af Amer: >60 mL/min (ref >60)
GFR calc non Af Amer: >60 mL/min (ref >60)
GLUCOSE: 129 mg/dL — AB (ref 65–99)
Potassium: 2.9 mmol/L — ABNORMAL LOW (ref 3.5–5.1)
SODIUM: 138 mmol/L (ref 135–145)
Total Bilirubin: 1 mg/dL (ref 0.3–1.2)
Total Protein: 6.6 g/dL (ref 6.5–8.1)

## 2015-12-06 MED ORDER — OCTREOTIDE ACETATE 20 MG IM KIT
20.0000 mg | PACK | Freq: Once | INTRAMUSCULAR | Status: AC
  Administered 2015-12-06: 20 mg via INTRAMUSCULAR
  Filled 2015-12-06: qty 1

## 2015-12-07 ENCOUNTER — Telehealth: Payer: Self-pay | Admitting: *Deleted

## 2015-12-07 DIAGNOSIS — E876 Hypokalemia: Secondary | ICD-10-CM

## 2015-12-07 LAB — CHROMOGRANIN A: Chromogranin A: 2 nmol/L (ref 0–5)

## 2015-12-07 MED ORDER — POTASSIUM CHLORIDE CRYS ER 20 MEQ PO TBCR: 20.0000 meq | EXTENDED_RELEASE_TABLET | Freq: Two times a day (BID) | ORAL | 0 refills | Status: DC

## 2015-12-07 NOTE — Telephone Encounter (Signed)
-----   Message from [name], MD sent at 12/07/2015  3:20 PM EST ----- Recommend potassium 40 mEq once a day for 2 weeks; repeat BMP. Please prescribe potassium.

## 2015-12-07 NOTE — Telephone Encounter (Signed)
left msg with grandchild-calling from doctor's office and I need to speak to patient. grandchild said that the patient was not avaiable (pt in shower) and to call his mom [name].  Spoke with [name]. Explained rx sent to pharmacy. Pt needs to take potassium 20 mEq twice daily x 2 weeks orally. Educated daughter that pt needs to take with food and glass of water.  Daughter gave Verbal understanding of the plan.

## 2015-12-14 ENCOUNTER — Other Ambulatory Visit: Payer: Self-pay | Admitting: Otolaryngology

## 2015-12-14 DIAGNOSIS — E041 Nontoxic single thyroid nodule: Secondary | ICD-10-CM

## 2015-12-18 ENCOUNTER — Inpatient Hospital Stay
Admission: EM | Admit: 2015-12-18 | Discharge: 2015-12-21 | DRG: 872 | Disposition: A | Discharge status: Home Health | Payer: Medicare Other | Attending: Internal Medicine | Admitting: Internal Medicine

## 2015-12-18 ENCOUNTER — Other Ambulatory Visit: Payer: Self-pay

## 2015-12-18 ENCOUNTER — Encounter: Payer: Self-pay | Admitting: *Deleted

## 2015-12-18 DIAGNOSIS — Z79811 Long term (current) use of aromatase inhibitors: Secondary | ICD-10-CM

## 2015-12-18 DIAGNOSIS — Z23 Encounter for immunization: Secondary | ICD-10-CM

## 2015-12-18 DIAGNOSIS — E039 Hypothyroidism, unspecified: Secondary | ICD-10-CM | POA: Diagnosis present

## 2015-12-18 DIAGNOSIS — R262 Difficulty in walking, not elsewhere classified: Secondary | ICD-10-CM

## 2015-12-18 DIAGNOSIS — N132 Hydronephrosis with renal and ureteral calculous obstruction: Secondary | ICD-10-CM | POA: Diagnosis present

## 2015-12-18 DIAGNOSIS — Z853 Personal history of malignant neoplasm of breast: Secondary | ICD-10-CM

## 2015-12-18 DIAGNOSIS — R102 Pelvic and perineal pain: Secondary | ICD-10-CM | POA: Diagnosis not present

## 2015-12-18 DIAGNOSIS — C785 Secondary malignant neoplasm of large intestine and rectum: Secondary | ICD-10-CM | POA: Diagnosis present

## 2015-12-18 DIAGNOSIS — A4151 Sepsis due to Escherichia coli [E. coli]: Principal | ICD-10-CM | POA: Diagnosis present

## 2015-12-18 DIAGNOSIS — J189 Pneumonia, unspecified organism: Secondary | ICD-10-CM

## 2015-12-18 DIAGNOSIS — I10 Essential (primary) hypertension: Secondary | ICD-10-CM | POA: Diagnosis present

## 2015-12-18 DIAGNOSIS — N390 Urinary tract infection, site not specified: Secondary | ICD-10-CM | POA: Diagnosis present

## 2015-12-18 DIAGNOSIS — Z882 Allergy status to sulfonamides status: Secondary | ICD-10-CM

## 2015-12-18 DIAGNOSIS — Z803 Family history of malignant neoplasm of breast: Secondary | ICD-10-CM

## 2015-12-18 DIAGNOSIS — K219 Gastro-esophageal reflux disease without esophagitis: Secondary | ICD-10-CM | POA: Diagnosis present

## 2015-12-18 DIAGNOSIS — A419 Sepsis, unspecified organism: Secondary | ICD-10-CM | POA: Diagnosis present

## 2015-12-18 DIAGNOSIS — C787 Secondary malignant neoplasm of liver and intrahepatic bile duct: Secondary | ICD-10-CM | POA: Diagnosis present

## 2015-12-18 MED ORDER — MORPHINE SULFATE (PF) 2 MG/ML IV SOLN
INTRAVENOUS | Status: AC
  Administered 2015-12-18: 2 mg via INTRAVENOUS
  Filled 2015-12-18: qty 1

## 2015-12-18 MED ORDER — CEFTRIAXONE SODIUM 2 G IJ SOLR
2.0000 g | Freq: Once | INTRAMUSCULAR | Status: DC
  Administered 2015-12-19: 2 g via INTRAVENOUS

## 2015-12-18 MED ORDER — SODIUM CHLORIDE 0.9 % IV BOLUS (SEPSIS)
1000.0000 mL | Freq: Once | INTRAVENOUS | Status: AC
  Administered 2015-12-19: 1000 mL via INTRAVENOUS

## 2015-12-18 MED ORDER — ONDANSETRON HCL 4 MG/2ML IJ SOLN
INTRAMUSCULAR | Status: AC
  Administered 2015-12-18: 4 mg
  Filled 2015-12-18: qty 2

## 2015-12-18 NOTE — ED Triage Notes (Signed)
Pt brought in via ems from home with right side abd pain.  Pt also has low back pain.  Pt has dysuria.  Hx kidney stone

## 2015-12-18 NOTE — ED Notes (Signed)
Pt brought in via ems from home with dysuria and abd pain.  Sx began last night.  Pt states she feels out of it today.  Pt has nausea.  Hx of kidney stones.  Pt also has low back ppain.  Pt is hard of hearing.

## 2015-12-19 ENCOUNTER — Inpatient Hospital Stay: Payer: Medicare Other | Admitting: Anesthesiology

## 2015-12-19 ENCOUNTER — Telehealth: Payer: Self-pay

## 2015-12-19 ENCOUNTER — Emergency Department: Payer: Medicare Other

## 2015-12-19 ENCOUNTER — Ambulatory Visit: Payer: Medicare Other

## 2015-12-19 ENCOUNTER — Encounter
Admission: EM | Disposition: A | Discharge status: Home Health | Payer: Self-pay | Source: Home / Self Care | Attending: Internal Medicine

## 2015-12-19 DIAGNOSIS — Z882 Allergy status to sulfonamides status: Secondary | ICD-10-CM | POA: Diagnosis not present

## 2015-12-19 DIAGNOSIS — C787 Secondary malignant neoplasm of liver and intrahepatic bile duct: Secondary | ICD-10-CM | POA: Diagnosis present

## 2015-12-19 DIAGNOSIS — E039 Hypothyroidism, unspecified: Secondary | ICD-10-CM | POA: Diagnosis present

## 2015-12-19 DIAGNOSIS — A419 Sepsis, unspecified organism: Secondary | ICD-10-CM | POA: Diagnosis present

## 2015-12-19 DIAGNOSIS — I10 Essential (primary) hypertension: Secondary | ICD-10-CM | POA: Diagnosis present

## 2015-12-19 DIAGNOSIS — Z23 Encounter for immunization: Secondary | ICD-10-CM | POA: Diagnosis present

## 2015-12-19 DIAGNOSIS — N132 Hydronephrosis with renal and ureteral calculous obstruction: Secondary | ICD-10-CM | POA: Diagnosis not present

## 2015-12-19 DIAGNOSIS — N390 Urinary tract infection, site not specified: Secondary | ICD-10-CM | POA: Diagnosis not present

## 2015-12-19 DIAGNOSIS — K219 Gastro-esophageal reflux disease without esophagitis: Secondary | ICD-10-CM | POA: Diagnosis present

## 2015-12-19 DIAGNOSIS — A4151 Sepsis due to Escherichia coli [E. coli]: Secondary | ICD-10-CM | POA: Diagnosis present

## 2015-12-19 DIAGNOSIS — R102 Pelvic and perineal pain: Secondary | ICD-10-CM | POA: Diagnosis present

## 2015-12-19 DIAGNOSIS — C785 Secondary malignant neoplasm of large intestine and rectum: Secondary | ICD-10-CM | POA: Diagnosis present

## 2015-12-19 DIAGNOSIS — Z853 Personal history of malignant neoplasm of breast: Secondary | ICD-10-CM | POA: Diagnosis not present

## 2015-12-19 DIAGNOSIS — Z803 Family history of malignant neoplasm of breast: Secondary | ICD-10-CM | POA: Diagnosis not present

## 2015-12-19 DIAGNOSIS — Z79811 Long term (current) use of aromatase inhibitors: Secondary | ICD-10-CM | POA: Diagnosis not present

## 2015-12-19 HISTORY — PX: CYSTOSCOPY WITH RETROGRADE PYELOGRAM, URETEROSCOPY AND STENT PLACEMENT: SHX5789

## 2015-12-19 LAB — BLOOD CULTURE ID PANEL (REFLEXED)
Acinetobacter baumannii: NOT DETECTED
CANDIDA ALBICANS: NOT DETECTED
CANDIDA GLABRATA: NOT DETECTED
Candida krusei: NOT DETECTED
Candida parapsilosis: NOT DETECTED
Candida tropicalis: NOT DETECTED
Carbapenem resistance: NOT DETECTED
ENTEROBACTER CLOACAE COMPLEX: NOT DETECTED
ENTEROCOCCUS SPECIES: NOT DETECTED
ESCHERICHIA COLI: DETECTED — AB
Enterobacteriaceae species: DETECTED — AB
Haemophilus influenzae: NOT DETECTED
Klebsiella oxytoca: NOT DETECTED
Klebsiella pneumoniae: NOT DETECTED
LISTERIA MONOCYTOGENES: NOT DETECTED
NEISSERIA MENINGITIDIS: NOT DETECTED
Proteus species: NOT DETECTED
Pseudomonas aeruginosa: NOT DETECTED
SERRATIA MARCESCENS: NOT DETECTED
STAPHYLOCOCCUS SPECIES: NOT DETECTED
STREPTOCOCCUS AGALACTIAE: NOT DETECTED
STREPTOCOCCUS PNEUMONIAE: NOT DETECTED
STREPTOCOCCUS SPECIES: NOT DETECTED
Staphylococcus aureus (BCID): NOT DETECTED
Streptococcus pyogenes: NOT DETECTED

## 2015-12-19 LAB — CBC
HEMATOCRIT: 38.7 % (ref 35.0–47.0)
Hemoglobin: 12.8 g/dL (ref 12.0–16.0)
MCH: 28.1 pg (ref 26.0–34.0)
MCHC: 33.2 g/dL (ref 32.0–36.0)
MCV: 84.8 fL (ref 80.0–100.0)
PLATELETS: 260 10*3/uL (ref 150–440)
RBC: 4.56 MIL/uL (ref 3.80–5.20)
RDW: 15.3 % — AB (ref 11.5–14.5)
WBC: 20.5 10*3/uL — ABNORMAL HIGH (ref 3.6–11.0)

## 2015-12-19 LAB — CBC WITH DIFFERENTIAL/PLATELET
Basophils Absolute: 0 10*3/uL (ref 0–0.1)
Basophils Relative: 0 %
EOS ABS: 0 10*3/uL (ref 0–0.7)
EOS PCT: 0 %
HCT: 37.7 % (ref 35.0–47.0)
Hemoglobin: 12.4 g/dL (ref 12.0–16.0)
Lymphocytes Relative: 2 %
Lymphs Abs: 0.4 10*3/uL — ABNORMAL LOW (ref 1.0–3.6)
MCH: 28.2 pg (ref 26.0–34.0)
MCHC: 32.9 g/dL (ref 32.0–36.0)
MCV: 85.7 fL (ref 80.0–100.0)
Monocytes Absolute: 1.3 10*3/uL — ABNORMAL HIGH (ref 0.2–0.9)
Monocytes Relative: 6 %
Neutro Abs: 21.2 10*3/uL — ABNORMAL HIGH (ref 1.4–6.5)
Neutrophils Relative %: 92 %
PLATELETS: 210 10*3/uL (ref 150–440)
RBC: 4.4 MIL/uL (ref 3.80–5.20)
RDW: 15.4 % — ABNORMAL HIGH (ref 11.5–14.5)
WBC: 22.9 10*3/uL — AB (ref 3.6–11.0)

## 2015-12-19 LAB — COMPREHENSIVE METABOLIC PANEL
ALBUMIN: 3.7 g/dL (ref 3.5–5.0)
ALT: 15 U/L (ref 14–54)
AST: 28 U/L (ref 15–41)
Alkaline Phosphatase: 87 U/L (ref 38–126)
Anion gap: 11 (ref 5–15)
BUN: 14 mg/dL (ref 6–20)
CHLORIDE: 99 mmol/L — AB (ref 101–111)
CO2: 24 mmol/L (ref 22–32)
CREATININE: 1.01 mg/dL — AB (ref 0.44–1.00)
Calcium: 8.9 mg/dL (ref 8.9–10.3)
GFR calc Af Amer: >60 mL/min (ref >60)
GFR calc non Af Amer: 53 mL/min — ABNORMAL LOW (ref >60)
GLUCOSE: 154 mg/dL — AB (ref 65–99)
POTASSIUM: 3.6 mmol/L (ref 3.5–5.1)
SODIUM: 134 mmol/L — AB (ref 135–145)
Total Bilirubin: 1.4 mg/dL — ABNORMAL HIGH (ref 0.3–1.2)
Total Protein: 6.9 g/dL (ref 6.5–8.1)

## 2015-12-19 LAB — URINALYSIS COMPLETE WITH MICROSCOPIC (ARMC ONLY)
BILIRUBIN URINE: NEGATIVE
Glucose, UA: NEGATIVE mg/dL
Hgb urine dipstick: NEGATIVE
KETONES UR: NEGATIVE mg/dL
LEUKOCYTES UA: NEGATIVE
Nitrite: NEGATIVE
PH: 7 (ref 5.0–8.0)
PROTEIN: NEGATIVE mg/dL
SQUAMOUS EPITHELIAL / LPF: NONE SEEN
Specific Gravity, Urine: 1.005 (ref 1.005–1.030)

## 2015-12-19 LAB — LACTIC ACID, PLASMA: LACTIC ACID, VENOUS: 1.8 mmol/L (ref 0.5–1.9)

## 2015-12-19 LAB — TSH: TSH: 0.702 u[IU]/mL (ref 0.350–4.500)

## 2015-12-19 LAB — LIPASE, BLOOD: LIPASE: 13 U/L (ref 11–51)

## 2015-12-19 SURGERY — CYSTOURETEROSCOPY, WITH RETROGRADE PYELOGRAM AND STENT INSERTION
Anesthesia: General | Laterality: Right

## 2015-12-19 MED ORDER — HYDROCODONE-ACETAMINOPHEN 5-325 MG PO TABS
1.0000 | ORAL_TABLET | Freq: Four times a day (QID) | ORAL | Status: DC | PRN
  Administered 2015-12-19 – 2015-12-21 (×7): 1 via ORAL
  Filled 2015-12-19 (×7): qty 1

## 2015-12-19 MED ORDER — ONDANSETRON HCL 4 MG PO TABS
4.0000 mg | ORAL_TABLET | Freq: Four times a day (QID) | ORAL | Status: DC | PRN
  Administered 2015-12-21: 04:00:00 4 mg via ORAL
  Filled 2015-12-19: qty 1

## 2015-12-19 MED ORDER — LABETALOL HCL 300 MG PO TABS
300.0000 mg | ORAL_TABLET | Freq: Two times a day (BID) | ORAL | Status: DC
  Filled 2015-12-19: qty 1

## 2015-12-19 MED ORDER — DOCUSATE SODIUM 100 MG PO CAPS
100.0000 mg | ORAL_CAPSULE | Freq: Two times a day (BID) | ORAL | Status: DC
  Administered 2015-12-19 (×2): 100 mg via ORAL
  Filled 2015-12-19 (×5): qty 1

## 2015-12-19 MED ORDER — ACETAMINOPHEN 500 MG PO TABS
ORAL_TABLET | ORAL | Status: AC
  Administered 2015-12-19: 1000 mg
  Filled 2015-12-19: qty 2

## 2015-12-19 MED ORDER — HYDROCHLOROTHIAZIDE 25 MG PO TABS
25.0000 mg | ORAL_TABLET | Freq: Every day | ORAL | Status: DC
  Administered 2015-12-20: 10:00:00 25 mg via ORAL
  Filled 2015-12-19 (×3): qty 1

## 2015-12-19 MED ORDER — FENTANYL CITRATE (PF) 100 MCG/2ML IJ SOLN
25.0000 ug | INTRAMUSCULAR | Status: DC | PRN
  Administered 2015-12-19 (×4): 25 ug via INTRAVENOUS

## 2015-12-19 MED ORDER — SODIUM CHLORIDE 0.9 % IV SOLN
INTRAVENOUS | Status: DC
  Administered 2015-12-19 – 2015-12-20 (×4): via INTRAVENOUS

## 2015-12-19 MED ORDER — CEFTRIAXONE SODIUM-DEXTROSE 2-2.22 GM-% IV SOLR
2.0000 g | Freq: Once | INTRAVENOUS | Status: DC
  Filled 2015-12-19: qty 50

## 2015-12-19 MED ORDER — ONDANSETRON HCL 4 MG/2ML IJ SOLN: 4.0000 mg | Freq: Once | INTRAMUSCULAR | Status: DC | PRN

## 2015-12-19 MED ORDER — FENTANYL CITRATE (PF) 100 MCG/2ML IJ SOLN
INTRAMUSCULAR | Status: AC
  Administered 2015-12-19: 25 ug via INTRAVENOUS
  Filled 2015-12-19: qty 2

## 2015-12-19 MED ORDER — SODIUM CHLORIDE 0.9 % IR SOLN
Status: DC | PRN
  Administered 2015-12-19: 200 mL

## 2015-12-19 MED ORDER — MIDAZOLAM HCL 2 MG/2ML IJ SOLN
INTRAMUSCULAR | Status: DC | PRN
  Administered 2015-12-19: 2 mg via INTRAVENOUS

## 2015-12-19 MED ORDER — LIDOCAINE HCL (CARDIAC) 20 MG/ML IV SOLN
INTRAVENOUS | Status: DC | PRN
  Administered 2015-12-19: 30 mg via INTRAVENOUS

## 2015-12-19 MED ORDER — CEFTRIAXONE SODIUM 1 G IJ SOLR: 1.0000 g | INTRAMUSCULAR | Status: DC

## 2015-12-19 MED ORDER — ONDANSETRON HCL 4 MG/2ML IJ SOLN: 4.0000 mg | Freq: Four times a day (QID) | INTRAMUSCULAR | Status: DC | PRN

## 2015-12-19 MED ORDER — CEFTRIAXONE SODIUM-DEXTROSE 1-3.74 GM-% IV SOLR: 1.0000 g | INTRAVENOUS | Status: DC

## 2015-12-19 MED ORDER — SODIUM CHLORIDE 0.9% FLUSH
3.0000 mL | Freq: Two times a day (BID) | INTRAVENOUS | Status: DC
  Administered 2015-12-19 – 2015-12-21 (×5): 3 mL via INTRAVENOUS

## 2015-12-19 MED ORDER — ENOXAPARIN SODIUM 40 MG/0.4ML ~~LOC~~ SOLN
40.0000 mg | SUBCUTANEOUS | Status: DC
  Administered 2015-12-19 – 2015-12-21 (×3): 40 mg via SUBCUTANEOUS
  Filled 2015-12-19 (×3): qty 0.4

## 2015-12-19 MED ORDER — EPHEDRINE SULFATE 50 MG/ML IJ SOLN
INTRAMUSCULAR | Status: DC | PRN
  Administered 2015-12-19 (×3): 5 mg via INTRAVENOUS

## 2015-12-19 MED ORDER — LOSARTAN POTASSIUM 50 MG PO TABS
100.0000 mg | ORAL_TABLET | Freq: Every day | ORAL | Status: DC
  Administered 2015-12-19 – 2015-12-21 (×2): 100 mg via ORAL
  Filled 2015-12-19 (×3): qty 2

## 2015-12-19 MED ORDER — IOTHALAMATE MEGLUMINE 43 % IV SOLN
INTRAVENOUS | Status: DC | PRN
  Administered 2015-12-19: 20 mL

## 2015-12-19 MED ORDER — LABETALOL HCL 100 MG PO TABS
300.0000 mg | ORAL_TABLET | Freq: Two times a day (BID) | ORAL | Status: DC
  Administered 2015-12-19 – 2015-12-21 (×5): 300 mg via ORAL
  Filled 2015-12-19 (×6): qty 3

## 2015-12-19 MED ORDER — VITAMIN D (ERGOCALCIFEROL) 1.25 MG (50000 UNIT) PO CAPS
50000.0000 [IU] | ORAL_CAPSULE | ORAL | Status: DC
  Administered 2015-12-20: 50000 [IU] via ORAL
  Filled 2015-12-19: qty 1

## 2015-12-19 MED ORDER — PROPOFOL 10 MG/ML IV BOLUS
INTRAVENOUS | Status: DC | PRN
Start: 2015-12-19 — End: 2015-12-19
  Administered 2015-12-19: 120 mg via INTRAVENOUS

## 2015-12-19 MED ORDER — SODIUM CHLORIDE 0.9 % IV SOLN
1.0000 g | Freq: Three times a day (TID) | INTRAVENOUS | Status: DC
  Administered 2015-12-19 – 2015-12-20 (×2): 1 g via INTRAVENOUS
  Filled 2015-12-19 (×4): qty 1

## 2015-12-19 MED ORDER — ACETAMINOPHEN 325 MG PO TABS: 650.0000 mg | ORAL_TABLET | Freq: Four times a day (QID) | ORAL | Status: DC | PRN

## 2015-12-19 MED ORDER — LABETALOL HCL 200 MG PO TABS
300.0000 mg | ORAL_TABLET | Freq: Two times a day (BID) | ORAL | Status: DC
  Filled 2015-12-19: qty 1

## 2015-12-19 MED ORDER — ACETAMINOPHEN 650 MG RE SUPP: 650.0000 mg | Freq: Four times a day (QID) | RECTAL | Status: DC | PRN

## 2015-12-19 MED ORDER — LOSARTAN POTASSIUM-HCTZ 100-25 MG PO TABS: 1.0000 | ORAL_TABLET | Freq: Every day | ORAL | Status: DC

## 2015-12-19 MED ORDER — POTASSIUM CHLORIDE CRYS ER 20 MEQ PO TBCR
20.0000 meq | EXTENDED_RELEASE_TABLET | Freq: Two times a day (BID) | ORAL | Status: DC
  Administered 2015-12-19 – 2015-12-21 (×5): 20 meq via ORAL
  Filled 2015-12-19 (×5): qty 1

## 2015-12-19 MED ORDER — LEVOTHYROXINE SODIUM 88 MCG PO TABS
88.0000 ug | ORAL_TABLET | Freq: Every day | ORAL | Status: DC
  Administered 2015-12-19 – 2015-12-21 (×3): 88 ug via ORAL
  Filled 2015-12-19 (×4): qty 1

## 2015-12-19 MED ORDER — INFLUENZA VAC SPLIT QUAD 0.5 ML IM SUSY
0.5000 mL | PREFILLED_SYRINGE | INTRAMUSCULAR | Status: AC
  Administered 2015-12-20: 0.5 mL via INTRAMUSCULAR
  Filled 2015-12-19: qty 0.5

## 2015-12-19 MED ORDER — ANASTROZOLE 1 MG PO TABS
1.0000 mg | ORAL_TABLET | Freq: Every day | ORAL | Status: DC
  Administered 2015-12-19 – 2015-12-21 (×3): 1 mg via ORAL
  Filled 2015-12-19 (×3): qty 1

## 2015-12-19 MED ORDER — FENTANYL CITRATE (PF) 100 MCG/2ML IJ SOLN
INTRAMUSCULAR | Status: DC | PRN
  Administered 2015-12-19: 50 ug via INTRAVENOUS

## 2015-12-19 SURGICAL SUPPLY — 29 items
CATH FOL LX CONE TIP  8F (CATHETERS)
CATH FOL LX CONE TIP 8F (CATHETERS) IMPLANT
CATH URETL OPEN END 6X70 (CATHETERS) ×3 IMPLANT
CONRAY 43 FOR UROLOGY 50M (MISCELLANEOUS) ×3 IMPLANT
FEE TECHNICIAN ONLY PER HOUR (MISCELLANEOUS) IMPLANT
GLOVE BIO SURGEON STRL SZ7 (GLOVE) ×6 IMPLANT
GLOVE BIO SURGEON STRL SZ7.5 (GLOVE) ×9 IMPLANT
GOWN STRL REUS W/ TWL LRG LVL4 (GOWN DISPOSABLE) ×1 IMPLANT
GOWN STRL REUS W/TWL LRG LVL4 (GOWN DISPOSABLE) ×2
GOWN STRL REUS W/TWL XL LVL3 (GOWN DISPOSABLE) ×3 IMPLANT
GUIDEWIRE ANG ZIPWIRE 035X150 (WIRE) IMPLANT
INTRODUCER DILATOR DOUBLE (INTRODUCER) IMPLANT
KIT RM TURNOVER CYSTO AR (KITS) ×3 IMPLANT
LASER HOLMIUM FIBER SU 272UM (MISCELLANEOUS) IMPLANT
PACK CYSTO AR (MISCELLANEOUS) ×3 IMPLANT
PREP PVP WINGED SPONGE (MISCELLANEOUS) ×3 IMPLANT
SENSORWIRE 0.038 NOT ANGLED (WIRE) ×3
SET CYSTO W/LG BORE CLAMP LF (SET/KITS/TRAYS/PACK) ×3 IMPLANT
SOL .9 NS 3000ML IRR  AL (IV SOLUTION) ×2
SOL .9 NS 3000ML IRR UROMATIC (IV SOLUTION) ×1 IMPLANT
SOL PREP PVP 2OZ (MISCELLANEOUS) ×3
SOLUTION PREP PVP 2OZ (MISCELLANEOUS) ×1 IMPLANT
STENT URET 6FRX24 CONTOUR (STENTS) ×3 IMPLANT
STENT URET 6FRX26 CONTOUR (STENTS) IMPLANT
SURGILUBE 2OZ TUBE FLIPTOP (MISCELLANEOUS) ×3 IMPLANT
SYRINGE IRR TOOMEY STRL 70CC (SYRINGE) ×3 IMPLANT
TUBING CONNECTING 10 (TUBING) ×2 IMPLANT
TUBING CONNECTING 10' (TUBING) ×1
WIRE SENSOR 0.038 NOT ANGLED (WIRE) ×1 IMPLANT

## 2015-12-19 NOTE — Telephone Encounter (Signed)
APPT MADE AN MAILED TO PATIENT LM FOR PT TO CB TO CONFIRM   Emma [name]

## 2015-12-19 NOTE — Progress Notes (Signed)
ANTIBIOTIC CONSULT NOTE - INITIAL  Pharmacy Consult for Meropenem  Indication: bacteremia  Allergies  Allergen Reactions  . Sulfa Antibiotics     Other reaction(s): Kidney Disorder    Patient Measurements: Height: 5\' 5"  (165.1 cm) Weight: 205 lb 6.4 oz (93.2 kg) IBW/kg (Calculated) : 57 Adjusted Body Weight:   Vital Signs: Temp: 99.1 F (37.3 C) (11/22 1545) Temp Source: Oral (11/22 1545) BP: 146/57 (11/22 1545) Pulse Rate: 83 (11/22 1545) Intake/Output from previous day: 11/21 0701 - 11/22 0700 In: 1300 [I.V.:300; IV Piggyback:1000] Out: 0  Intake/Output from this shift: Total I/O In: 1355 [P.O.:480; I.V.:875] Out: -   Labs:  Recent Labs  12/18/15 2335 12/19/15 0807  WBC 20.5* 22.9*  HGB 12.8 12.4  PLT 260 210  CREATININE 1.01*  --    Estimated Creatinine Clearance: 55.2 mL/min (by C-G formula based on SCr of 1.01 mg/dL (H)). No results for input(s): VANCOTROUGH, VANCOPEAK, VANCORANDOM, GENTTROUGH, GENTPEAK, GENTRANDOM, TOBRATROUGH, TOBRAPEAK, TOBRARND, AMIKACINPEAK, AMIKACINTROU, AMIKACIN in the last 72 hours.   Microbiology: Recent Results (from the past 720 hour(s))  Blood Culture (routine x 2)     Status: None (Preliminary result)   Collection Time: 12/18/15 11:35 PM  Result Value Ref Range Status   Specimen Description BLOOD  R FA  Final   Special Requests   Final    BOTTLES DRAWN AEROBIC AND ANAEROBIC  AER 5 ML ANA 4 ML   Culture  Setup Time   Final    AEROBIC BOTTLE ONLY GRAM NEGATIVE RODS CRITICAL RESULT CALLED TO, READ BACK BY AND VERIFIED WITH: Lamesha Tibbits 12/19/15 1740 KLW    Culture GRAM NEGATIVE RODS  Final   Report Status PENDING  Incomplete  Blood Culture (routine x 2)     Status: None (Preliminary result)   Collection Time: 12/18/15 11:35 PM  Result Value Ref Range Status   Specimen Description BLOOD  R AC  Final   Special Requests   Final    BOTTLES DRAWN AEROBIC AND ANAEROBIC  AER 4 ML ANA 4 ML   Culture  Setup Time   Final   Organism ID to follow GRAM NEGATIVE RODS AEROBIC BOTTLE ONLY CRITICAL RESULT CALLED TO, READ BACK BY AND VERIFIED WITH: Cyril Railey 12/19/15 1740 KLW    Culture GRAM NEGATIVE RODS  Final   Report Status PENDING  Incomplete  Blood Culture ID Panel (Reflexed)     Status: Abnormal   Collection Time: 12/18/15 11:35 PM  Result Value Ref Range Status   Enterococcus species NOT DETECTED NOT DETECTED Final   Listeria monocytogenes NOT DETECTED NOT DETECTED Final   Staphylococcus species NOT DETECTED NOT DETECTED Final   Staphylococcus aureus NOT DETECTED NOT DETECTED Final   Streptococcus species NOT DETECTED NOT DETECTED Final   Streptococcus agalactiae NOT DETECTED NOT DETECTED Final   Streptococcus pneumoniae NOT DETECTED NOT DETECTED Final   Streptococcus pyogenes NOT DETECTED NOT DETECTED Final   Acinetobacter baumannii NOT DETECTED NOT DETECTED Final   Enterobacteriaceae species DETECTED (A) NOT DETECTED Final    Comment: CRITICAL RESULT CALLED TO, READ BACK BY AND VERIFIED WITH: Skylen Danielsen 12/19/15 1740 KLW    Enterobacter cloacae complex NOT DETECTED NOT DETECTED Final   Escherichia coli DETECTED (A) NOT DETECTED Final    Comment: CRITICAL RESULT CALLED TO, READ BACK BY AND VERIFIED WITH: Kloie Whiting 12/19/15 1740 KLW    Klebsiella oxytoca NOT DETECTED NOT DETECTED Final   Klebsiella pneumoniae NOT DETECTED NOT DETECTED Final   Proteus species  NOT DETECTED NOT DETECTED Final   Serratia marcescens NOT DETECTED NOT DETECTED Final   Carbapenem resistance NOT DETECTED NOT DETECTED Final   Haemophilus influenzae NOT DETECTED NOT DETECTED Final   Neisseria meningitidis NOT DETECTED NOT DETECTED Final   Pseudomonas aeruginosa NOT DETECTED NOT DETECTED Final   Candida albicans NOT DETECTED NOT DETECTED Final   Candida glabrata NOT DETECTED NOT DETECTED Final   Candida krusei NOT DETECTED NOT DETECTED Final   Candida parapsilosis NOT DETECTED NOT DETECTED Final   Candida  tropicalis NOT DETECTED NOT DETECTED Final    Medical History: Past Medical History:  Diagnosis Date  . Aromatase inhibitor use   . Breast cancer, left breast ([place]) 06/19/2014  . GERD (gastroesophageal reflux disease)   . Hypertension   . 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 Thyroid disease     Medications:  Scheduled:  . anastrozole  1 mg Oral Daily  . cefTRIAXone  2 g Intravenous Once  . docusate sodium  100 mg Oral BID  . enoxaparin (LOVENOX) injection  40 mg Subcutaneous Q24H  . losartan  100 mg Oral Daily   Or  . hydrochlorothiazide  25 mg Oral Daily  . [START ON 12/20/2015] Influenza vac split quadrivalent PF  0.5 mL Intramuscular Tomorrow-1000  . labetalol  300 mg Oral BID  . levothyroxine  88 mcg Oral QAC breakfast  . meropenem (MERREM) IV  1 g Intravenous Q8H  . potassium chloride SA  20 mEq Oral BID  . sodium chloride flush  3 mL Intravenous Q12H  . [START ON 12/20/2015] Vitamin D (Ergocalciferol)  50,000 Units Oral Weekly   Assessment: CrCl = 55.2 ml/min  Pharmacy consulted to dose meropenem in this 75 year old female growing E Coli in  2 aerobic bottles. KPC not detected.   Goal of Therapy:  resolution of infection  Plan:  Expected duration 7 days with resolution of temperature and/or normalization of WBC   Will D/C ceftriaxone and start meropenem 1 gm IV Q8H on 11/22 @ 19:00.   Lua Feng D 12/19/2015,5:50 PM

## 2015-12-19 NOTE — ED Notes (Signed)
Family left.  Pt to ct scan. Iv fluids infusing.  Pt alert.  nsr on monitor.

## 2015-12-19 NOTE — Progress Notes (Signed)
The patient is status post right ureteral stent placement for an obstructing an infected right ureteral stone. The procedure was uncomplicated.  Recommend the patient continue antibiotics for 14 days. She will be scheduled for follow-up with [facility] in 1 week. Discharge instructions have been provided.  Please contact urology with any further questions/concerns.

## 2015-12-19 NOTE — ED Notes (Signed)
md in with pt and family   Iv fluids infusing.

## 2015-12-19 NOTE — Anesthesia Preprocedure Evaluation (Signed)
Anesthesia Evaluation  Patient identified by MRN, date of birth, ID band Patient awake    Reviewed: Allergy & Precautions, H&P , NPO status , Patient's Chart, lab work & pertinent test results, reviewed documented beta blocker date and time   Airway Mallampati: II  TM Distance: <3 FB Neck ROM: full    Dental  (+) Teeth Intact   Pulmonary neg pulmonary ROS,    Pulmonary exam normal        Cardiovascular Exercise Tolerance: Good hypertension, negative cardio ROS Normal cardiovascular exam Rhythm:regular Rate:Normal     Neuro/Psych negative neurological ROS  negative psych ROS   GI/Hepatic negative GI ROS, Neg liver ROS, GERD  Medicated,  Endo/Other  negative endocrine ROSHypothyroidism   Renal/GU negative Renal ROS  negative genitourinary   Musculoskeletal   Abdominal   Peds  Hematology negative hematology ROS (+) anemia ,   Anesthesia Other Findings   Reproductive/Obstetrics negative OB ROS                             Anesthesia Physical Anesthesia Plan  ASA: II and emergent  Anesthesia Plan: General LMA   Post-op Pain Management:    Induction:   Airway Management Planned:   Additional Equipment:   Intra-op Plan:   Post-operative Plan:   Informed Consent: I have reviewed the patients History and Physical, chart, labs and discussed the procedure including the risks, benefits and alternatives for the proposed anesthesia with the patient or authorized representative who has indicated his/her understanding and acceptance.     Plan Discussed with: CRNA  Anesthesia Plan Comments:         Anesthesia Quick Evaluation

## 2015-12-19 NOTE — Care Management (Signed)
Admitted to [facility] following a cystoscopy. Lives with daughter, [name] [phone]). Sees Dr. Kary Kos as primary care physician. Last fall was a month ago.  Followed by [facility] in the past, No skilled facility. No home oxygen. Wheelchair, bedside commode, and rolling walker in the home.No Life Alert.  Prescriptions are filled at [facility] in [place]. Takes care of all basic activities of daily living herself, drives. Daughter will transport. Shelbie Ammons RN MSN CCM Care Management

## 2015-12-19 NOTE — H&P (Signed)
Emma [name] is an 75 y.o. female.   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 The patient states "she has been out of it" all day and has noticed that her lower abdomen is tender. She denies fevers, nausea, vomiting or diarrhea. In the emergency department she was found to have gross pyuria. Also, CT of the abdomen revealed an obstructing kidney stone of the right ureter with associated hydronephrosis. Due to kidney stone and UTI the emergency department staff called the hospitalist service for admission.  Past Medical History:  Diagnosis Date  . Aromatase inhibitor use   . Breast cancer, left breast ([place]) 06/19/2014  . GERD (gastroesophageal reflux disease)   . Hypertension   . 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 Thyroid disease     Past Surgical History:  Procedure Laterality Date  . ABDOMINAL HYSTERECTOMY     partial  . BREAST EXCISIONAL BIOPSY Left 12/2012   +  . BREAST LUMPECTOMY WITH SENTINEL LYMPH NODE BIOPSY Left 2014  . CHOLECYSTECTOMY      Family History  Problem Relation Age of Onset  . Breast cancer Mother 58  . Breast cancer Maternal Aunt 68   Social History:  reports that she has never smoked. She has never used smokeless tobacco. She reports that she does not drink alcohol or use drugs.  Allergies:  Allergies  Allergen Reactions  . Sulfa Antibiotics     Other reaction(s): Kidney Disorder    Prior to Admission medications   Medication Sig Start Date End Date Taking? Authorizing Provider  anastrozole (ARIMIDEX) 1 MG tablet Take 1 tablet (1 mg total) by mouth daily. 11/06/15  Yes [name], MD  cefdinir (OMNICEF) 300 MG capsule Take 300 mg by mouth 2 (two) times daily.   Yes Historical Provider, MD  ergocalciferol (VITAMIN D2) 50000 units capsule Take 1 capsule (50,000 Units total) by mouth once a week. 11/06/15   Yes [name], MD  labetalol (NORMODYNE) 300 MG tablet TAKE 1 TABLET (300 MG TOTAL) BY MOUTH 2 (TWO) TIMES DAILY. 08/09/14  Yes Historical Provider, MD  levothyroxine (SYNTHROID) 88 MCG tablet Take 88 mcg by mouth daily before breakfast.  06/20/14  Yes Historical Provider, MD  losartan-hydrochlorothiazide (HYZAAR) 100-25 MG tablet Take 1 tablet by mouth daily.   Yes Historical Provider, MD  octreotide (SANDOSTATIN LAR) 30 MG injection Inject 30 mg into the muscle every 28 (twenty-eight) days.    Yes Historical Provider, MD  potassium chloride SA (K-DUR,KLOR-CON) 20 MEQ tablet Take 1 tablet (20 mEq total) by mouth 2 (two) times daily. X 2 weeks 12/07/15  Yes [name], MD     Results for orders placed or performed during the hospital encounter of 12/18/15 (from the past 48 hour(s))  Lipase, blood     Status: None   Collection Time: 12/18/15 11:35 PM  Result Value Ref Range   Lipase 13 11 - 51 U/L  Comprehensive metabolic panel     Status: Abnormal   Collection Time: 12/18/15 11:35 PM  Result Value Ref Range   Sodium 134 (L) 135 - 145 mmol/L   Potassium 3.6 3.5 - 5.1 mmol/L   Chloride 99 (L) 101 - 111 mmol/L   CO2 24 22 - 32 mmol/L   Glucose, Bld 154 (H) 65 - 99 mg/dL   BUN 14 6 - 20 mg/dL   Creatinine, Ser 1.01 (H) 0.44 -  1.00 mg/dL   Calcium 8.9 8.9 - 10.3 mg/dL   Total Protein 6.9 6.5 - 8.1 g/dL   Albumin 3.7 3.5 - 5.0 g/dL   AST 28 15 - 41 U/L   ALT 15 14 - 54 U/L   Alkaline Phosphatase 87 38 - 126 U/L   Total Bilirubin 1.4 (H) 0.3 - 1.2 mg/dL   GFR calc non Af Amer 53 (L) >60 mL/min   GFR calc Af Amer >60 >60 mL/min    Comment: (NOTE) The eGFR has been calculated using the CKD EPI equation. This calculation has not been validated in all clinical situations. eGFR's persistently <60 mL/min signify possible Chronic Kidney Disease.    Anion gap 11 5 - 15  CBC     Status: Abnormal   Collection Time: 12/18/15 11:35 PM  Result Value Ref Range   WBC 20.5 (H)  3.6 - 11.0 K/uL   RBC 4.56 3.80 - 5.20 MIL/uL   Hemoglobin 12.8 12.0 - 16.0 g/dL   HCT 38.7 35.0 - 47.0 %   MCV 84.8 80.0 - 100.0 fL   MCH 28.1 26.0 - 34.0 pg   MCHC 33.2 32.0 - 36.0 g/dL   RDW 15.3 (H) 11.5 - 14.5 %   Platelets 260 150 - 440 K/uL  Urinalysis complete, with microscopic     Status: Abnormal   Collection Time: 12/18/15 11:35 PM  Result Value Ref Range   Color, Urine STRAW (A) YELLOW   APPearance CLEAR (A) CLEAR   Glucose, UA NEGATIVE NEGATIVE mg/dL   Bilirubin Urine NEGATIVE NEGATIVE   Ketones, ur NEGATIVE NEGATIVE mg/dL   Specific Gravity, Urine 1.005 1.005 - 1.030   Hgb urine dipstick NEGATIVE NEGATIVE   pH 7.0 5.0 - 8.0   Protein, ur NEGATIVE NEGATIVE mg/dL   Nitrite NEGATIVE NEGATIVE   Leukocytes, UA NEGATIVE NEGATIVE   RBC / HPF TOO NUMEROUS TO COUNT 0 - 5 RBC/hpf   WBC, UA TOO NUMEROUS TO COUNT 0 - 5 WBC/hpf   Bacteria, UA MANY (A) NONE SEEN   Squamous Epithelial / LPF NONE SEEN NONE SEEN  Lactic acid, plasma     Status: None   Collection Time: 12/19/15 12:42 AM  Result Value Ref Range   Lactic Acid, Venous 1.8 0.5 - 1.9 mmol/L   No results found.  Review of Systems  Constitutional: Positive for malaise/fatigue. Negative for chills and fever.  HENT: Positive for hearing loss (chronic). Negative for sore throat and tinnitus.   Eyes: Negative for blurred vision and redness.  Respiratory: Negative for cough and shortness of breath.   Cardiovascular: Negative for chest pain, palpitations, orthopnea and PND.  Gastrointestinal: Positive for abdominal pain (Suprapubic). Negative for diarrhea, nausea and vomiting.  Genitourinary: Negative for dysuria, frequency and urgency.  Musculoskeletal: Negative for joint pain and myalgias.  Skin: Negative for rash.       No lesions  Neurological: Negative for speech change, focal weakness and weakness.  Endo/Heme/Allergies: Does not bruise/bleed easily.       No temperature intolerance  Psychiatric/Behavioral:  Negative for depression and suicidal ideas.    Blood pressure (!) 117/54, pulse 73, temperature 99.7 F (37.6 C), temperature source Oral, resp. rate 17, height _0  (1.651 m), weight 93 kg (205 lb), SpO2 91 %. Physical Exam  Vitals reviewed. Constitutional: She is oriented to person, place, and time. She appears well-developed and well-nourished. No distress.  HENT:  Head: Normocephalic and atraumatic.  Mouth/Throat: Oropharynx is clear and moist.  Eyes: Conjunctivae  and EOM are normal. Pupils are equal, round, and reactive to light. No scleral icterus.  Neck: Normal range of motion. Neck supple. No JVD present. No tracheal deviation present. No thyromegaly present.  Cardiovascular: Normal rate, regular rhythm and normal heart sounds.  Exam reveals no gallop and no friction rub.   No murmur heard. Respiratory: Effort normal and breath sounds normal.  GI: Soft. Bowel sounds are normal. She exhibits distension. She exhibits no mass. There is tenderness. There is no rebound and no guarding.  Genitourinary:  Genitourinary Comments: Deferred  Musculoskeletal: Normal range of motion. She exhibits no edema.  Lymphadenopathy:    She has no cervical adenopathy.  Neurological: She is alert and oriented to person, place, and time. No cranial nerve deficit. She exhibits normal muscle tone.  Skin: Skin is warm and dry. No rash noted. No erythema.  Psychiatric: She has a normal mood and affect. Her behavior is normal. Judgment and thought content normal.     Assessment/Plan This is a 75 year old female admitted sepsis secondary to UTI. 1. Sepsis: The patient meets criteria via leukocytosis and fever. She is hemodynamically stable. Continue to fluid resuscitate and give Rocephin. Follow blood cultures and urine cultures for growth and sensitivities. 2. UTI: Secondary to obstructing kidney stone. She will be taken to the OR for removal of the stone. 3. Hypertension: Controlled; continue losartan,  HCTZ and labetalol 4. Hypothyroidism: Check TSH; continue Synthroid 5. History of breast cancer: Continue Arimidex 6. 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 DVT prophylaxis: Lovenox 8. GI prophylaxis: None The patient is a full code. Time spent on admission orders and patient care approximate 45 minutes  Harrie Foreman, MD 12/19/2015, 2:07 AM

## 2015-12-19 NOTE — Progress Notes (Signed)
Pharmacy Antibiotic Note  Emma [name] is a 75 y.o. female admitted on 12/18/2015 with UTI.  Pharmacy has been consulted for Ceftriaxone dosing.  Plan: Ceftriaxone 1 gm IV Q24H  Height: 5\' 5"  (165.1 cm) Weight: 205 lb (93 kg) IBW/kg (Calculated) : 57  Temp (24hrs), Avg:100.5 F (38.1 C), Min:99.3 F (37.4 C), Max:101.6 F (38.7 C)   Recent Labs Lab 12/18/15 2335  WBC 20.5*  CREATININE 1.01*    Estimated Creatinine Clearance: 55.1 mL/min (by C-G formula based on SCr of 1.01 mg/dL (H)).    Allergies  Allergen Reactions  . Sulfa Antibiotics     Other reaction(s): Kidney Disorder   Thank you for allowing pharmacy to be a part of this patient's care.  Laural Benes, Pharm.D., BCPS Clinical Pharmacist 12/19/2015 12:33 AM

## 2015-12-19 NOTE — Transfer of Care (Signed)
Immediate Anesthesia Transfer of Care Note  Patient: Emma [name]  Procedure(s) Performed: Procedure(s): CYSTOSCOPY WITH RETROGRADE PYELOGRAM, URETEROSCOPY AND STENT PLACEMENT (Right)  Patient Location: PACU  Anesthesia Type:General  Level of Consciousness: sedated  Airway & Oxygen Therapy: Patient Spontanous Breathing and Patient connected to face mask oxygen  Post-op Assessment: Report given to RN and Post -op Vital signs reviewed and stable  Post vital signs: Reviewed and stable  Last Vitals:  Vitals:   12/19/15 0417 12/19/15 0559  BP: (!) 135/52 (!) 101/45  Pulse: 71 65  Resp: 17 16  Temp: 36.5 C (P) 36.5 C    Last Pain:  Vitals:   12/19/15 0417  TempSrc: Oral  PainSc:          Complications: No apparent anesthesia complications

## 2015-12-19 NOTE — Progress Notes (Signed)
PHARMACY - PHYSICIAN COMMUNICATION CRITICAL VALUE ALERT - BLOOD CULTURE IDENTIFICATION (BCID)  Results for orders placed or performed during the hospital encounter of 12/18/15  Blood Culture ID Panel (Reflexed) (Collected: 12/18/2015 11:35 PM)  Result Value Ref Range   Enterococcus species NOT DETECTED NOT DETECTED   Listeria monocytogenes NOT DETECTED NOT DETECTED   Staphylococcus species NOT DETECTED NOT DETECTED   Staphylococcus aureus NOT DETECTED NOT DETECTED   Streptococcus species NOT DETECTED NOT DETECTED   Streptococcus agalactiae NOT DETECTED NOT DETECTED   Streptococcus pneumoniae NOT DETECTED NOT DETECTED   Streptococcus pyogenes NOT DETECTED NOT DETECTED   Acinetobacter baumannii NOT DETECTED NOT DETECTED   Enterobacteriaceae species DETECTED (A) NOT DETECTED   Enterobacter cloacae complex NOT DETECTED NOT DETECTED   Escherichia coli DETECTED (A) NOT DETECTED   Klebsiella oxytoca NOT DETECTED NOT DETECTED   Klebsiella pneumoniae NOT DETECTED NOT DETECTED   Proteus species NOT DETECTED NOT DETECTED   Serratia marcescens NOT DETECTED NOT DETECTED   Carbapenem resistance NOT DETECTED NOT DETECTED   Haemophilus influenzae NOT DETECTED NOT DETECTED   Neisseria meningitidis NOT DETECTED NOT DETECTED   Pseudomonas aeruginosa NOT DETECTED NOT DETECTED   Candida albicans NOT DETECTED NOT DETECTED   Candida glabrata NOT DETECTED NOT DETECTED   Candida krusei NOT DETECTED NOT DETECTED   Candida parapsilosis NOT DETECTED NOT DETECTED   Candida tropicalis NOT DETECTED NOT DETECTED    Name of physician (or Provider) Contacted: Konidena   Changes to prescribed antibiotics required: Yes Will change from ceftriaxone 1 gm IV Q24H to meropenem 1 gm IV Q8H.   Bartley Vuolo D 12/19/2015  5:47 PM

## 2015-12-19 NOTE — ED Notes (Signed)
Return from scan.  Pt sleeping.  Iv's in place.

## 2015-12-19 NOTE — H&P (Signed)
I have been asked to see the patient by Dr. Rosario Adie, for evaluation and management of right-sided nephrolithiasis and UTI.  History of present illness: 75 year old white female who presented to the emergency room earlier this morning with altered mental status, fever, and right-sided flank pain. Workup revealed concern for urinary tract infection and a CT scan was performed demonstrating a 4 x 3 mm right distal ureterovesical junction stone. The patient's white blood cell count was noted to be 20,000 and she was febrile to 101.6. She is otherwise hemodynamically stable.  The patient's pain began approximately 12 hours prior to her presentation. She describes severe right-sided flank pain radiating down into her groin. She describes associated nausea and vomiting. She was subjectively febrile at home but did not take her temperature.  Review of systems: A 12 point comprehensive review of systems was obtained and is negative unless otherwise stated in the history of present illness.  Patient Active Problem List   Diagnosis Date Noted  . Sepsis ([place]) 12/19/2015  . 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 Fall 08/25/2015  . Left tibial fracture 08/25/2015  . Anxiety 08/25/2015  . GERD (gastroesophageal reflux disease) 08/25/2015  . HTN (hypertension) 08/25/2015  . Hypothyroidism 08/25/2015  . IDA (iron deficiency anemia) 06/19/2014  . Breast cancer, left breast ([place]) 06/19/2014  . 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    No current facility-administered medications on file prior to encounter.    Current Outpatient Prescriptions on File Prior to Encounter  Medication Sig Dispense Refill  . anastrozole (ARIMIDEX) 1 MG tablet Take 1 tablet (1 mg total) by mouth daily. 30 tablet 6  . ergocalciferol (VITAMIN D2) 50000 units capsule Take 1 capsule (50,000 Units total) by mouth once a week. 12 capsule 1  . labetalol (NORMODYNE) 300 MG tablet TAKE 1 TABLET (300 MG TOTAL) BY MOUTH  2 (TWO) TIMES DAILY.    Marland Kitchen levothyroxine (SYNTHROID) 88 MCG tablet Take 88 mcg by mouth daily before breakfast.     . octreotide (SANDOSTATIN LAR) 30 MG injection Inject 30 mg into the muscle every 28 (twenty-eight) days.     . potassium chloride SA (K-DUR,KLOR-CON) 20 MEQ tablet Take 1 tablet (20 mEq total) by mouth 2 (two) times daily. X 2 weeks 28 tablet 0    Past Medical History:  Diagnosis Date  . Aromatase inhibitor use   . Breast cancer, left breast ([place]) 06/19/2014  . GERD (gastroesophageal reflux disease)   . Hypertension   . 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 Thyroid disease     Past Surgical History:  Procedure Laterality Date  . ABDOMINAL HYSTERECTOMY     partial  . BREAST EXCISIONAL BIOPSY Left 12/2012   +  . BREAST LUMPECTOMY WITH SENTINEL LYMPH NODE BIOPSY Left 2014  . CHOLECYSTECTOMY      Social History  Substance Use Topics  . Smoking status: Never Smoker  . Smokeless tobacco: Never Used  . Alcohol use No    Family History  Problem Relation Age of Onset  . Breast cancer Mother 41  . Breast cancer Maternal Aunt 68    PE: Vitals:   12/19/15 0145 12/19/15 0227 12/19/15 0228 12/19/15 0417  BP:  (!) 119/47  (!) 135/52  Pulse: 73 76 73 71  Resp: 17 19 15 17   Temp:    97.7 F (36.5 C)  TempSrc:    Oral  SpO2: 91% 93% 94% 96%  Weight:    93.2 kg (205 lb 6.4 oz)  Height:  5\' 5"  (1.651 m)   Patient appears to be in no acute distress  patient is alert and oriented x3 Atraumatic normocephalic head No cervical or supraclavicular lymphadenopathy appreciated No increased work of breathing, no audible wheezes/rhonchi Regular sinus rhythm/rate Abdomen is soft, nontender, nondistended, Right-sided CVA tenderness with tenderness in the right abdomen. Lower extremities are symmetric without appreciable edema Grossly neurologically intact No identifiable skin lesions   Recent Labs  12/18/15 2335  WBC 20.5*  HGB 12.8  HCT 38.7    Recent Labs   12/18/15 2335  NA 134*  K 3.6  CL 99*  CO2 24  GLUCOSE 154*  BUN 14  CREATININE 1.01*  CALCIUM 8.9   No results for input(s): LABPT, INR in the last 72 hours. No results for input(s): LABURIN in the last 72 hours. Results for orders placed or performed during the hospital encounter of 08/25/15  Urine culture     Status: Abnormal   Collection Time: 08/25/15  8:37 PM  Result Value Ref Range Status   Specimen Description URINE, RANDOM  Final   Special Requests NONE  Final   Culture >=100,000 COLONIES/mL ESCHERICHIA COLI (A)  Final   Report Status 08/28/2015 FINAL  Final   Organism ID, Bacteria ESCHERICHIA COLI (A)  Final      Susceptibility   Escherichia coli - MIC*    AMPICILLIN <=2 SENSITIVE Sensitive     CEFAZOLIN <=4 SENSITIVE Sensitive     CEFTRIAXONE <=1 SENSITIVE Sensitive     CIPROFLOXACIN <=0.25 SENSITIVE Sensitive     GENTAMICIN <=1 SENSITIVE Sensitive     IMIPENEM <=0.25 SENSITIVE Sensitive     NITROFURANTOIN <=16 SENSITIVE Sensitive     TRIMETH/SULFA <=20 SENSITIVE Sensitive     AMPICILLIN/SULBACTAM <=2 SENSITIVE Sensitive     PIP/TAZO <=4 SENSITIVE Sensitive     Extended ESBL NEGATIVE Sensitive     * >=100,000 COLONIES/mL ESCHERICHIA COLI  Surgical pcr screen     Status: None   Collection Time: 08/26/15  2:38 AM  Result Value Ref Range Status   MRSA, PCR NEGATIVE NEGATIVE Final   Staphylococcus aureus NEGATIVE NEGATIVE Final    Comment:        The Xpert SA Assay (FDA approved for NASAL specimens in patients over 60 years of age), is one component of a comprehensive surveillance program.  Test performance has been validated by [facility] for patients greater than or equal to 67 year old. It is not intended to diagnose infection nor to guide or monitor treatment.     Imaging: I've independently reviewed the patient's CT scan demonstrating right-sided hydroureteronephrosis with perinephric stranding and a right UVJ stone measuring approximately 3 x 4  mm.  Imp: 75 year old female otherwise reasonably healthy with a right distal ureteral stone and evidence of infection and impending urosepsis.  Recommendations: I discussed the implications of an obstructing stone and urinary tract infection with the patient. Given her infection, we've opted to proceed to operating room emergently for a right ureteral stent placement to prevent her from developing sepsis. I discussed the procedure with the patient in some detail including the risks and benefits. All questions were answered. The patient understands that she'll have a stent following the procedure with associated stent discomfort. She also understands that following this procedure she will need a second definitive stone procedure in the future.   Louis Meckel W

## 2015-12-19 NOTE — Progress Notes (Signed)
Patient seen and examined.  Still has right flank pain. Some tenderness on examination without rigidity or guarding.  * Right ureteral obstructing stone with UTI and sepsis present on admission status post right ureteral stent placement for an obstructing an infected right ureteral stone. On IV ceftriaxone and IV fluids. WBC count still elevated.  Likely discharge tomorrow if pain is improved, afebrile and WBC trending down.

## 2015-12-19 NOTE — Care Management Important Message (Signed)
Important Message  Patient Details  Name: JENNIFERLYNN SAAD MRN: [id number] Date of Birth: 09/19/1940   Medicare Important Message Given:  Yes    Shelbie Ammons, RN 12/19/2015, 11:32 AM

## 2015-12-19 NOTE — Op Note (Signed)
Preoperative diagnosis:  1. Right obstructing stone 2. Urinary tract infection   Postoperative diagnosis:  1. same   Procedure:  1. Cystoscopy 2. right ureteral stent placement 3. right retrograde pyelography with interpretation   Surgeon: Ardis Hughs, MD  Anesthesia: General  Complications: None  Intraoperative findings:  right retrograde pyelography demonstrated a filling defect within the right ureter consistent with the patient's known calculus without other abnormalities.  EBL: Minimal  Specimens: None  Indication: Emma [name] is a 75 y.o. patient with right-sided obstructing stone with associated renal colic and clinical evidence of urinary tract infection. After reviewing the management options for treatment, he elected to proceed with the above surgical procedure(s). We have discussed the potential benefits and risks of the procedure, side effects of the proposed treatment, the likelihood of the patient achieving the goals of the procedure, and any potential problems that might occur during the procedure or recuperation. Informed consent has been obtained.  Description of procedure:  The patient was taken to the operating room and general anesthesia was induced.  The patient was placed in the dorsal lithotomy position, prepped and draped in the usual sterile fashion, and preoperative antibiotics were administered. A preoperative time-out was performed.   Cystourethroscopy was performed.  The patient's urethra was examined and was normal.  The bladder was then systematically examined in its entirety. There was no evidence for any bladder tumors, stones, or other mucosal pathology.    Attention then turned to the rightureteral orifice and a ureteral catheter was used to intubate the ureteral orifice.  Omnipaque contrast was injected through the ureteral catheter and a retrograde pyelogram was performed with findings as dictated above.  A 0.38 sensor guidewire was  then advanced up the right ureter into the renal pelvis under fluoroscopic guidance.  The wire was then backloaded through the cystoscope and a ureteral stent was advance over the wire using Seldinger technique.  The stent was positioned appropriately under fluoroscopic and cystoscopic guidance.  The wire was then removed with an adequate stent curl noted in the renal pelvis as well as in the bladder.  The bladder was then emptied and the procedure ended.  The patient appeared to tolerate the procedure well and without complications.  The patient was able to be awakened and transferred to the recovery unit in satisfactory condition.    Ardis Hughs, M.D.

## 2015-12-19 NOTE — ED Notes (Signed)
Iv fluids infusing. Pt alert.  Family with pt.  nsr on monitor.

## 2015-12-19 NOTE — ED Provider Notes (Signed)
[facility] Emergency Department Provider Note    First MD Initiated Contact with Patient 12/18/15 2341     (approximate)  I have reviewed the triage vital signs and the nursing notes.   HISTORY  Chief Complaint Abdominal Pain   HPI Emma [name] is a 75 y.o. female with below listed chronic medical issues presents to the emergency department with 8 out of 10 pelvic pain and dysuria  with onset today.. Patient admits to subjective fevers as well. In addition the patient admits to increasing confusion. Patient denies any back pain does admit to nausea with 1 episode of vomiting. Patient also admits to chronic diarrhea that preceded the symptoms.   Past Medical History:  Diagnosis Date  . Aromatase inhibitor use   . Breast cancer, left breast ([place]) 06/19/2014  . GERD (gastroesophageal reflux disease)   . Hypertension   . 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 Thyroid disease     Patient Active Problem List   Diagnosis Date Noted  . Sepsis ([place]) 12/19/2015  . 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 Fall 08/25/2015  . Left tibial fracture 08/25/2015  . Anxiety 08/25/2015  . GERD (gastroesophageal reflux disease) 08/25/2015  . HTN (hypertension) 08/25/2015  . Hypothyroidism 08/25/2015  . IDA (iron deficiency anemia) 06/19/2014  . Breast cancer, left breast ([place]) 06/19/2014  . 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 ABDOMINAL HYSTERECTOMY     partial  . BREAST EXCISIONAL BIOPSY Left 12/2012   +  . BREAST LUMPECTOMY WITH SENTINEL LYMPH NODE BIOPSY Left 2014  . CHOLECYSTECTOMY      Prior to Admission medications   Medication Sig Start Date End Date Taking? Authorizing Provider  anastrozole (ARIMIDEX) 1 MG tablet Take 1 tablet (1 mg total) by mouth daily. 11/06/15  Yes [name], MD  cefdinir (OMNICEF) 300 MG capsule Take 300 mg by mouth 2 (two) times daily.   Yes  Historical Provider, MD  ergocalciferol (VITAMIN D2) 50000 units capsule Take 1 capsule (50,000 Units total) by mouth once a week. 11/06/15  Yes [name], MD  labetalol (NORMODYNE) 300 MG tablet TAKE 1 TABLET (300 MG TOTAL) BY MOUTH 2 (TWO) TIMES DAILY. 08/09/14  Yes Historical Provider, MD  levothyroxine (SYNTHROID) 88 MCG tablet Take 88 mcg by mouth daily before breakfast.  06/20/14  Yes Historical Provider, MD  losartan-hydrochlorothiazide (HYZAAR) 100-25 MG tablet Take 1 tablet by mouth daily.   Yes Historical Provider, MD  octreotide (SANDOSTATIN LAR) 30 MG injection Inject 30 mg into the muscle every 28 (twenty-eight) days.    Yes Historical Provider, MD  potassium chloride SA (K-DUR,KLOR-CON) 20 MEQ tablet Take 1 tablet (20 mEq total) by mouth 2 (two) times daily. X 2 weeks 12/07/15  Yes [name], MD    Allergies Sulfa antibiotics  Family History  Problem Relation Age of Onset  . Breast cancer Mother 26  . Breast cancer Maternal Aunt 68    Social History Social History  Substance Use Topics  . Smoking status: Never Smoker  . Smokeless tobacco: Never Used  . Alcohol use No    Review of Systems Constitutional:Positive for fever/chills Eyes: No visual changes. ENT: No sore throat. Cardiovascular: Denies chest pain. Respiratory: Denies shortness of breath. Gastrointestinal: No abdominal pain.  No nausea, positive vomiting Genitourinary: Positive for dysuria. Musculoskeletal: Negative for back pain. Skin: Negative for rash. Neurological: Negative for headaches, focal weakness or numbness.  10-point ROS otherwise negative.  ____________________________________________   PHYSICAL EXAM:  VITAL SIGNS: ED Triage Vitals  Enc Vitals Group     BP 12/18/15 2321 (!) 142/57     Pulse Rate 12/18/15 2325 88     Resp 12/18/15 2325 20     Temp 12/18/15 2325 99.3 F (37.4 C)     Temp Source 12/18/15 2325 Oral     SpO2 12/18/15 2325 98 %     Weight  12/18/15 2326 205 lb (93 kg)     Height 12/18/15 2326 5\' 5"  (1.651 m)     Head Circumference --      Peak Flow --      Pain Score 12/18/15 2326 6     Pain Loc --      Pain Edu? --      Excl. in [place]? --     Constitutional: Alert and oriented. Well appearing and in no acute distress. Eyes: Conjunctivae are normal. PERRL. EOMI. Head: Atraumatic. Ears:  Healthy appearing ear canals and TMs bilaterally Nose: No congestion/rhinnorhea. Mouth/Throat: Mucous membranes are moist.  Oropharynx non-erythematous. Neck: No stridor.  No meningeal signs.  No cervical spine tenderness to palpation. Cardiovascular: Normal rate, regular rhythm. Good peripheral circulation. Grossly normal heart sounds. Respiratory: Normal respiratory effort.  No retractions. Lungs CTAB. Gastrointestinal: Soft and nontender. No distention.  Musculoskeletal: No lower extremity tenderness nor edema. No gross deformities of extremities. Neurologic:  Normal speech and language. No gross focal neurologic deficits are appreciated.  Skin:  Skin is warm, dry and intact. No rash noted. Psychiatric: Mood and affect are normal. Speech and behavior are normal.  ____________________________________________   LABS (all labs ordered are listed, but only abnormal results are displayed)  Labs Reviewed  COMPREHENSIVE METABOLIC PANEL - Abnormal; Notable for the following:       Result Value   Sodium 134 (*)    Chloride 99 (*)    Glucose, Bld 154 (*)    Creatinine, Ser 1.01 (*)    Total Bilirubin 1.4 (*)    GFR calc non Af Amer 53 (*)    All other components within normal limits  CBC - Abnormal; Notable for the following:    WBC 20.5 (*)    RDW 15.3 (*)    All other components within normal limits  URINALYSIS COMPLETEWITH MICROSCOPIC ([facility] ONLY) - Abnormal; Notable for the following:    Color, Urine STRAW (*)    APPearance CLEAR (*)    Bacteria, UA MANY (*)    All other components within normal limits  CULTURE, BLOOD (ROUTINE  X 2)  CULTURE, BLOOD (ROUTINE X 2)  URINE CULTURE  LIPASE, BLOOD  LACTIC ACID, PLASMA  LACTIC ACID, PLASMA   ____________________________________________  EKG  ED ECG REPORT I, [place] N Uilani Sanville, the attending physician, personally viewed and interpreted this ECG.   Date: 12/19/2015  EKG Time: 11:46 PM  Rate: 82  Rhythm: Normal sinus rhythm  Axis: Normal  Intervals: Normal  ST&T Change: None  ____________________________________________  RADIOLOGY I, [place] N Esme Durkin, personally viewed and evaluated these images (plain radiographs) as part of my medical decision making, as well as reviewing the written report by the radiologist.  Ct Renal Stone Study  Result Date: 12/19/2015 CLINICAL DATA:  Acute onset of dysuria and generalized abdominal pain. Nausea. Initial encounter. EXAM: CT ABDOMEN AND PELVIS WITHOUT CONTRAST TECHNIQUE: Multidetector CT imaging of the abdomen and pelvis was performed following the standard protocol without IV contrast. COMPARISON:  CT of the  abdomen and pelvis performed 01/31/2014 FINDINGS: Lower chest: Trace pleural fluid is noted bilaterally, with mild bibasilar atelectasis. Scattered coronary artery calcifications are seen. The heart is mildly enlarged. Hepatobiliary: The liver is unremarkable in appearance. The patient is status post cholecystectomy, with clips noted at the gallbladder fossa. The common bile duct remains normal in caliber. Pancreas: The pancreas is within normal limits. Spleen: A peripherally calcified splenic cystic lesion is noted at the anterior aspect of the spleen, measuring 3.5 cm. Adrenals/Urinary Tract: The adrenal glands are unremarkable in appearance. There is incomplete rotation of the right kidney. Mild right-sided hydronephrosis is noted, with prominence of the right ureter along its entire course, to the level of an obstructing 4 x 3 mm stone at the distal right ureter, just above the right vesicoureteral junction. A small  nonobstructing 2 mm stone is noted at the posterior aspect of the right kidney. Stomach/Bowel: The stomach is unremarkable in appearance. The small bowel is within normal limits. The appendix is normal in caliber, without evidence of appendicitis. The colon is unremarkable in appearance. Note is again made of a spiculated retractile mass at the central small bowel mesentery, demonstrating focal calcifications, and measuring 7.1 cm in maximal dimension. 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 Vascular/Lymphatic: Scattered calcification is seen along the abdominal aorta and its branches. The abdominal aorta is otherwise grossly unremarkable. The inferior vena cava is grossly unremarkable. No retroperitoneal lymphadenopathy is seen. No pelvic sidewall lymphadenopathy is identified. Reproductive: The bladder is mildly distended. The uterus is grossly unremarkable. A 5.4 cm mass at the right adnexa is stable from 2010 and benign in appearance. 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 Other: No additional soft tissue abnormalities are seen. Musculoskeletal: No acute osseous abnormalities are identified. The visualized musculature is unremarkable in appearance. IMPRESSION: 1. Mild right-sided hydronephrosis, with an obstructing 4 x 3 mm stone noted at the distal right ureter, just above the right vesicoureteral junction. 2. Spiculated retractile mass at the central small bowel mesentery demonstrates focal calcifications, measuring up to 7.1 cm in maximal dimension. 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 3. 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 4. Stable peripherally calcified cystic lesion at the anterior  spleen is likely benign. 5. 2 mm nonobstructing stone at the posterior aspect of the right kidney. 6. Trace bilateral pleural fluid, with mild bibasilar atelectasis. 7. Scattered coronary artery calcifications.  Mild cardiomegaly. 8. Scattered aortic atherosclerosis. Electronically Signed   By: Garald Balding M.D.   On: 12/19/2015 02:28      Procedures   Critical Care performed: CRITICAL CARE Performed by: Gregor Hams   Total critical care time: 45 minutes  Critical care time was exclusive of separately billable procedures and treating other patients.  Critical care was necessary to treat or prevent imminent or life-threatening deterioration.  Critical care was time spent personally by me on the following activities: development of treatment plan with patient and/or surrogate as well as nursing, discussions with consultants, evaluation of patient's response to treatment, examination of patient, obtaining history from patient or surrogate, ordering and performing treatments and interventions, ordering and review of laboratory studies, ordering and review of radiographic studies, pulse oximetry and re-evaluation of patient's condition.  ____________________________________________    INITIAL IMPRESSION / ASSESSMENT AND PLAN / ED COURSE  Pertinent labs & imaging results that were available during my care of the patient were reviewed by me and considered in my medical decision making (see chart for details).  History physical exam concern for possible sepsis most likely secondary to urinary tract infection as such patient received IV antibiotics and normal saline 30 ml/kg. Patient spoke with Dr. Marcille Blanco for hospital admission for sepsis secondary to urinary tract infection. Given history of kidney stones and concern for possible kidney stone CT renal performed which revealed a right UVJ stone. As such I called and notified Dr. Louis Meckel urologist on call who plans take the patient to the  operating room.   Clinical Course     ____________________________________________  FINAL CLINICAL IMPRESSION(S) / ED DIAGNOSES  Sepsis Urinary tract infection Right kidney stone  MEDICATIONS GIVEN DURING THIS VISIT:  Medications  cefTRIAXone (ROCEPHIN) IVPB 2 g (2 g Intravenous Not Given 12/19/15 0345)  morphine 2 MG/ML injection (2 mg Intravenous Given 12/18/15 2357)  ondansetron (ZOFRAN) 4 MG/2ML injection (4 mg  Given 12/18/15 2358)  sodium chloride 0.9 % bolus 1,000 mL (0 mLs Intravenous Stopped 12/19/15 0228)    And  sodium chloride 0.9 % bolus 1,000 mL (0 mLs Intravenous Stopped 12/19/15 0345)    And  sodium chloride 0.9 % bolus 1,000 mL (0 mLs Intravenous Stopped 12/19/15 0150)  acetaminophen (TYLENOL) 500 MG tablet (1,000 mg  Given 12/19/15 0006)     NEW OUTPATIENT MEDICATIONS STARTED DURING THIS VISIT:  New Prescriptions   No medications on file    Modified Medications   No medications on file    Discontinued Medications   APIXABAN (ELIQUIS) 2.5 MG TABS TABLET    Take 2.5 mg by mouth.     Note:  This document was prepared using Dragon voice recognition software and may include unintentional dictation errors.    Gregor Hams, MD 12/19/15 [phone]

## 2015-12-19 NOTE — Discharge Instructions (Signed)
DISCHARGE INSTRUCTIONS FOR KIDNEY STONE/URETERAL STENT   MEDICATIONS:  1.  Resume all your other meds from home - except do not take any extra narcotic pain meds that you may have at home.  2. Continue to take the antibiotics as prescribed.  ACTIVITY:  1. No strenuous activity x 1week  2. No driving while on narcotic pain medications  3. Drink plenty of water  4. Continue to walk at home - you can still get blood clots when you are at home, so keep active, but don't over do it.  5. May return to work/school tomorrow or when you feel ready   BATHING:  1. You can shower and we recommend daily showers   SIGNS/SYMPTOMS TO CALL:  Please call us if you have a fever greater than 101.5, uncontrolled nausea/vomiting, uncontrolled pain, dizziness, unable to urinate, bloody urine, chest pain, shortness of breath, leg swelling, leg pain, redness around wound, drainage from wound, or any other concerns or questions.   You can reach [place] at [phone].   FOLLOW-UP:  1. UA will be scheduled for follow-up with [facility] in 1 week.

## 2015-12-19 NOTE — Anesthesia Procedure Notes (Signed)
Procedure Name: LMA Insertion Date/Time: 12/19/2015 5:29 AM Performed by: Lendon Colonel Pre-anesthesia Checklist: Patient identified, Emergency Drugs available, Suction available, Patient being monitored and Timeout performed Patient Re-evaluated:Patient Re-evaluated prior to inductionOxygen Delivery Method: Circle system utilized Preoxygenation: Pre-oxygenation with 100% oxygen Intubation Type: IV induction Ventilation: Mask ventilation without difficulty LMA: LMA inserted LMA Size: 4.0 Placement Confirmation: positive ETCO2 Dental Injury: Teeth and Oropharynx as per pre-operative assessment

## 2015-12-19 NOTE — Telephone Encounter (Signed)
-----   Message from Ardis Hughs, MD sent at 12/19/2015  5:44 AM EST ----- Regarding: f/u Can you please help get this patient scheduled for f/u in one week to see MD about f/u for removal of her stone?

## 2015-12-20 ENCOUNTER — Inpatient Hospital Stay: Payer: Medicare Other

## 2015-12-20 LAB — CBC WITH DIFFERENTIAL/PLATELET
BASOS ABS: 0 10*3/uL (ref 0–0.1)
BASOS PCT: 0 %
EOS ABS: 0.1 10*3/uL (ref 0–0.7)
Eosinophils Relative: 1 %
HEMATOCRIT: 35.7 % (ref 35.0–47.0)
Hemoglobin: 11.6 g/dL — ABNORMAL LOW (ref 12.0–16.0)
Lymphocytes Relative: 3 %
Lymphs Abs: 0.4 10*3/uL — ABNORMAL LOW (ref 1.0–3.6)
MCH: 27.9 pg (ref 26.0–34.0)
MCHC: 32.4 g/dL (ref 32.0–36.0)
MCV: 86.1 fL (ref 80.0–100.0)
MONO ABS: 1.2 10*3/uL — AB (ref 0.2–0.9)
Monocytes Relative: 7 %
NEUTROS ABS: 15.1 10*3/uL — AB (ref 1.4–6.5)
Neutrophils Relative %: 89 %
PLATELETS: 172 10*3/uL (ref 150–440)
RBC: 4.15 MIL/uL (ref 3.80–5.20)
RDW: 16.2 % — AB (ref 11.5–14.5)
WBC: 16.7 10*3/uL — ABNORMAL HIGH (ref 3.6–11.0)

## 2015-12-20 LAB — BASIC METABOLIC PANEL
ANION GAP: 7 (ref 5–15)
BUN: 21 mg/dL — ABNORMAL HIGH (ref 6–20)
CALCIUM: 8 mg/dL — AB (ref 8.9–10.3)
CO2: 23 mmol/L (ref 22–32)
CREATININE: 1.11 mg/dL — AB (ref 0.44–1.00)
Chloride: 109 mmol/L (ref 101–111)
GFR, EST AFRICAN AMERICAN: 55 mL/min — AB (ref >60)
GFR, EST NON AFRICAN AMERICAN: 48 mL/min — AB (ref >60)
GLUCOSE: 131 mg/dL — AB (ref 65–99)
Potassium: 3.4 mmol/L — ABNORMAL LOW (ref 3.5–5.1)
Sodium: 139 mmol/L (ref 135–145)

## 2015-12-20 LAB — HEMOGLOBIN A1C
Hgb A1c MFr Bld: 6.1 % — ABNORMAL HIGH (ref 4.8–5.6)
Mean Plasma Glucose: 128 mg/dL

## 2015-12-20 MED ORDER — MEROPENEM-SODIUM CHLORIDE 1 GM/50ML IV SOLR
1.0000 g | Freq: Three times a day (TID) | INTRAVENOUS | Status: DC
  Administered 2015-12-20 – 2015-12-21 (×4): 1 g via INTRAVENOUS
  Filled 2015-12-20 (×5): qty 50

## 2015-12-20 NOTE — Progress Notes (Signed)
[facility] [place] NAME: Emma [name]    MR#:  [id number]  DATE OF BIRTH:  October 21, 1940  SUBJECTIVE:   Feeling weak this am but doing ok better then on admission  REVIEW OF SYSTEMS:    Review of Systems  Constitutional: Negative for chills, fever and malaise/fatigue.  HENT: Negative.  Negative for ear discharge, ear pain, hearing loss, nosebleeds and sore throat.   Eyes: Negative.  Negative for blurred vision and pain.  Respiratory: Negative.  Negative for cough, hemoptysis, shortness of breath and wheezing.   Cardiovascular: Negative.  Negative for chest pain, palpitations and leg swelling.  Gastrointestinal: Negative.  Negative for abdominal pain, blood in stool, diarrhea, nausea and vomiting.  Genitourinary: Negative.  Negative for dysuria.  Musculoskeletal: Negative.  Negative for back pain.  Skin: Negative.   Neurological: Positive for weakness. Negative for dizziness, tremors, speech change, focal weakness, seizures and headaches.  Endo/Heme/Allergies: Negative.  Does not bruise/bleed easily.  Psychiatric/Behavioral: Negative.  Negative for depression, hallucinations and suicidal ideas.    Tolerating Diet:yes      DRUG ALLERGIES:   Allergies  Allergen Reactions  . Sulfa Antibiotics     Other reaction(s): Kidney Disorder    VITALS:  Blood pressure (!) 147/53, pulse 70, temperature 97.9 F (36.6 C), temperature source Oral, resp. rate 18, height 5\' 5"  (1.651 m), weight 96.4 kg (212 lb 9.6 oz), SpO2 96 %.  PHYSICAL EXAMINATION:   Physical Exam  Constitutional: She is oriented to person, place, and time and well-developed, well-nourished, and in no distress. No distress.  HENT:  Head: Normocephalic.  Eyes: No scleral icterus.  Neck: Normal range of motion. Neck supple. No JVD present. No tracheal deviation present.  Cardiovascular: Normal rate, regular rhythm and normal heart sounds.  Exam reveals no gallop and no  friction rub.   No murmur heard. Pulmonary/Chest: Effort normal. No respiratory distress. She has no wheezes. She has no rales. She exhibits no tenderness.  Crackles left side  Abdominal: Soft. Bowel sounds are normal. She exhibits no distension and no mass. There is no tenderness. There is no rebound and no guarding.  Musculoskeletal: Normal range of motion. She exhibits no edema.  Neurological: She is alert and oriented to person, place, and time.  Skin: Skin is warm. No rash noted. No erythema.  Psychiatric: Affect and judgment normal.      LABORATORY PANEL:   CBC  Recent Labs Lab 12/20/15 0530  WBC 16.7*  HGB 11.6*  HCT 35.7  PLT 172   ------------------------------------------------------------------------------------------------------------------  Chemistries   Recent Labs Lab 12/18/15 2335 12/20/15 0530  NA 134* 139  K 3.6 3.4*  CL 99* 109  CO2 24 23  GLUCOSE 154* 131*  BUN 14 21*  CREATININE 1.01* 1.11*  CALCIUM 8.9 8.0*  AST 28  --   ALT 15  --   ALKPHOS 87  --   BILITOT 1.4*  --    ------------------------------------------------------------------------------------------------------------------  Cardiac Enzymes No results for input(s): TROPONINI in the last 168 hours. ------------------------------------------------------------------------------------------------------------------  RADIOLOGY:  Ct Renal Stone Study  Result Date: 12/19/2015 CLINICAL DATA:  Acute onset of dysuria and generalized abdominal pain. Nausea. Initial encounter. EXAM: CT ABDOMEN AND PELVIS WITHOUT CONTRAST TECHNIQUE: Multidetector CT imaging of the abdomen and pelvis was performed following the standard protocol without IV contrast. COMPARISON:  CT of the abdomen and pelvis performed 01/31/2014 FINDINGS: Lower chest: Trace pleural fluid is noted bilaterally, with mild bibasilar atelectasis. Scattered coronary artery  calcifications are seen. The heart is mildly enlarged.  Hepatobiliary: The liver is unremarkable in appearance. The patient is status post cholecystectomy, with clips noted at the gallbladder fossa. The common bile duct remains normal in caliber. Pancreas: The pancreas is within normal limits. Spleen: A peripherally calcified splenic cystic lesion is noted at the anterior aspect of the spleen, measuring 3.5 cm. Adrenals/Urinary Tract: The adrenal glands are unremarkable in appearance. There is incomplete rotation of the right kidney. Mild right-sided hydronephrosis is noted, with prominence of the right ureter along its entire course, to the level of an obstructing 4 x 3 mm stone at the distal right ureter, just above the right vesicoureteral junction. A small nonobstructing 2 mm stone is noted at the posterior aspect of the right kidney. Stomach/Bowel: The stomach is unremarkable in appearance. The small bowel is within normal limits. The appendix is normal in caliber, without evidence of appendicitis. The colon is unremarkable in appearance. Note is again made of a spiculated retractile mass at the central small bowel mesentery, demonstrating focal calcifications, and measuring 7.1 cm in maximal dimension. 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 Vascular/Lymphatic: Scattered calcification is seen along the abdominal aorta and its branches. The abdominal aorta is otherwise grossly unremarkable. The inferior vena cava is grossly unremarkable. No retroperitoneal lymphadenopathy is seen. No pelvic sidewall lymphadenopathy is identified. Reproductive: The bladder is mildly distended. The uterus is grossly unremarkable. A 5.4 cm mass at the right adnexa is stable from 2010 and benign in appearance. 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 Other: No additional soft tissue abnormalities are seen. Musculoskeletal: No acute osseous  abnormalities are identified. The visualized musculature is unremarkable in appearance. IMPRESSION: 1. Mild right-sided hydronephrosis, with an obstructing 4 x 3 mm stone noted at the distal right ureter, just above the right vesicoureteral junction. 2. Spiculated retractile mass at the central small bowel mesentery demonstrates focal calcifications, measuring up to 7.1 cm in maximal dimension. 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 3. 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 4. Stable peripherally calcified cystic lesion at the anterior spleen is likely benign. 5. 2 mm nonobstructing stone at the posterior aspect of the right kidney. 6. Trace bilateral pleural fluid, with mild bibasilar atelectasis. 7. Scattered coronary artery calcifications.  Mild cardiomegaly. 8. Scattered aortic atherosclerosis. Electronically Signed   By: Garald Balding M.D.   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  1. Obstructing Right distal infected ureteral stone status post right ureteral stent placement with sepsis and UTI.BACTEREMIA Urine culture growing gram-negative rods. bcidshowing Enterobacter and Escherichia coli Follow-up on final urine and blood culture Patient will need 2 weeks of antibiotics Continue ceftriaxone and meropenem  2. Sepsis with leukocytosis and fever on admission due to REM negative rod bacteremia/UTI Resolving  3. Essential hypertension: Continue losartan, HCTZ and labetalol  4. Hypothyroid: Continue Synthroid  5. Left lung crackles on examination: Chest x-ray for this a.m.  6. Metastatic Carcinoid tumor small  intestine   7. History of breast cancer on  Arimidex   PT consult today  Management plans discussed with the patient and SHE is in agreement.  CODE STATUS: full  TOTAL TIME TAKING CARE OF THIS PATIENT: 36 minutes.     POSSIBLE D/C TOMORROW, DEPENDING ON CLINICAL CONDITION.   Emma [name] M.D on 12/20/2015 at 9:35 AM  Between 7am to 6pm - Pager - [phone] After 6pm go to [email or web address] - password [email or web address] [facility]  Office  [phone]  CC: Primary care physician; [name], MD  Note: This dictation was prepared with Dragon dictation along with smaller phrase technology. Any transcriptional errors that result from this process are unintentional.

## 2015-12-20 NOTE — Evaluation (Signed)
Physical Therapy Evaluation Patient Details Name: Emma [name] MRN: [id number] DOB: 01-Oct-1940 Today's Date: 12/20/2015   History of Present Illness  Pt is a 75 y/o female that presents with AMS and R flank pain, found to have R kidney stone. History of proximal L tibia fx, managed non-operatively in July.  Clinical Impression  Patient seen after complaints of weakness and flank pain (which she reports has subsided). She had a L tibial fx over the summer, has had altered gait pattern since. At baseline she uses a RW for limited distances in her daughters house, though beyond that patient is not a reliable historian. She is able to perform bed mobility, transfers, gait as expected for her baseline. She performs a modified step-to pattern and continues to lack stance time on LLE. She had no buckling or loss of balance and appears more appropriate for HHPT.     Follow Up Recommendations Home health PT    Equipment Recommendations  Rolling walker with 5" wheels    Recommendations for Other Services       Precautions / Restrictions Precautions Precautions: Fall Restrictions Weight Bearing Restrictions: No      Mobility  Bed Mobility Overal bed mobility: Needs Assistance Bed Mobility: Supine to Sit     Supine to sit: Min guard;Min assist     General bed mobility comments: Patient requires minor assistance with bringing LEs and trunk to appropriate position.   Transfers Overall transfer level: Needs assistance Equipment used: Rolling walker (2 wheeled) Transfers: Sit to/from Stand Sit to Stand: Min guard         General transfer comment: No loss of balance noted, slow but appropriate mechanics.   Ambulation/Gait Ambulation/Gait assistance: Min guard Ambulation Distance (Feet): 100 Feet Assistive device: Rolling walker (2 wheeled) Gait Pattern/deviations: Step-to pattern;Decreased stance time - left   Gait velocity interpretation: Below normal speed for  age/gender General Gait Details: Patient has history of old LLE injury, ambulates with step to pattern and decreased stance time on LLE, no buckling or loss of balance.  Stairs            Wheelchair Mobility    Modified Rankin (Stroke Patients Only)       Balance Overall balance assessment: Needs assistance Sitting-balance support: No upper extremity supported;Feet supported Sitting balance-Leahy Scale: Fair     Standing balance support: Bilateral upper extremity supported Standing balance-Leahy Scale: Fair                               Pertinent Vitals/Pain Pain Assessment: No/denies pain    Home Living Family/patient expects to be discharged to:: Private residence Living Arrangements: Children Available Help at Discharge: Family Type of Home: House         Home Equipment: Gilford Rile - 2 wheels Additional Comments: Patient is confused and a poor historian, unclear how reliable her history provided is.     Prior Function                 Hand Dominance        Extremity/Trunk Assessment   Upper Extremity Assessment: Overall WFL for tasks assessed           Lower Extremity Assessment: Overall WFL for tasks assessed         Communication   Communication: HOH  Cognition Arousal/Alertness: Awake/alert Behavior During Therapy: WFL for tasks assessed/performed Overall Cognitive Status: Difficult to assess  Memory:  (Patient reports she is a "bit foggy", occasional poor logic and train of thought, does not appear fully oriented. )              General Comments      Exercises     Assessment/Plan    PT Assessment Patient needs continued PT services  PT Problem List Decreased strength;Decreased activity tolerance;Decreased mobility;Decreased balance          PT Treatment Interventions DME instruction;Gait training;Stair training;Therapeutic exercise;Therapeutic activities;Functional mobility training;Neuromuscular  re-education;Balance training    PT Goals (Current goals can be found in the Care Plan section)  Acute Rehab PT Goals Patient Stated Goal: To return home  PT Goal Formulation: With patient Time For Goal Achievement: 01/03/16 Potential to Achieve Goals: Good    Frequency Min 2X/week   Barriers to discharge        Co-evaluation               End of Session Equipment Utilized During Treatment: Gait belt Activity Tolerance: Patient tolerated treatment well Patient left: in chair;with chair alarm set;with call bell/phone within reach Nurse Communication: Mobility status         Time: 1550-1606 PT Time Calculation (min) (ACUTE ONLY): 16 min   Charges:   PT Evaluation $PT Eval Moderate Complexity: 1 Procedure     PT G Codes:       Kerman Passey, PT, DPT    12/20/2015, 5:38 PM

## 2015-12-21 LAB — URINE CULTURE: Culture: >=100000 — AB

## 2015-12-21 LAB — BASIC METABOLIC PANEL
Anion gap: 7 (ref 5–15)
BUN: 17 mg/dL (ref 6–20)
CHLORIDE: 108 mmol/L (ref 101–111)
CO2: 23 mmol/L (ref 22–32)
Calcium: 8.4 mg/dL — ABNORMAL LOW (ref 8.9–10.3)
Creatinine, Ser: 0.91 mg/dL (ref 0.44–1.00)
Glucose, Bld: 142 mg/dL — ABNORMAL HIGH (ref 65–99)
POTASSIUM: 3.2 mmol/L — AB (ref 3.5–5.1)
SODIUM: 138 mmol/L (ref 135–145)

## 2015-12-21 MED ORDER — HYDRALAZINE HCL 20 MG/ML IJ SOLN: 10.0000 mg | Freq: Four times a day (QID) | INTRAMUSCULAR | Status: DC | PRN

## 2015-12-21 MED ORDER — POTASSIUM CHLORIDE CRYS ER 20 MEQ PO TBCR
40.0000 meq | EXTENDED_RELEASE_TABLET | Freq: Once | ORAL | Status: AC
  Administered 2015-12-21: 13:00:00 40 meq via ORAL
  Filled 2015-12-21: qty 2

## 2015-12-21 MED ORDER — POTASSIUM CHLORIDE 20 MEQ/15ML (10%) PO SOLN: 40.0000 meq | Freq: Once | ORAL | Status: DC

## 2015-12-21 MED ORDER — CIPROFLOXACIN HCL 500 MG PO TABS: 500.0000 mg | ORAL_TABLET | Freq: Two times a day (BID) | ORAL | 0 refills | Status: AC

## 2015-12-21 MED ORDER — HYDRALAZINE HCL 20 MG/ML IJ SOLN
20.0000 mg | Freq: Once | INTRAMUSCULAR | Status: AC
  Administered 2015-12-21: 07:00:00 20 mg via INTRAVENOUS
  Filled 2015-12-21: qty 1

## 2015-12-21 MED ORDER — HYDROCODONE-ACETAMINOPHEN 5-325 MG PO TABS: 1.0000 | ORAL_TABLET | Freq: Four times a day (QID) | ORAL | 0 refills | Status: DC | PRN

## 2015-12-21 NOTE — Progress Notes (Signed)
Prescribing Norco as per Dr. Trellis Paganini request -dispense 15 for Emma [name] prescription is given to the charge nurse Ms. Estill Bamberg

## 2015-12-21 NOTE — Care Management (Signed)
Spoke with daughter on the telephone. Discussed home health agencies. [facility] representative, Brad updated. Discharge to home today per Dr. Benjie Karvonen Daughter will transport. Shelbie Ammons RN MSN CCM Care Management

## 2015-12-21 NOTE — Progress Notes (Signed)
Pharmacy Antibiotic Note  Emma [name] is a 75 y.o. female admitted on 12/18/2015 with bacteremia/UTI.  Pharmacy has been consulted for meropenem dosing dosing. Patient with Ecoli in urine and blood.   Plan: Meropenem 1g IV Q8hr. Ecoli in urine is pan-sensitive. Recommend transitioning patient to cefazolin 2g IV Q8hr.   Height: 5\' 5"  (165.1 cm) Weight: 211 lb 12.8 oz (96.1 kg) IBW/kg (Calculated) : 57  Temp (24hrs), Avg:98.1 F (36.7 C), Min:97.6 F (36.4 C), Max:99.2 F (37.3 C)   Recent Labs Lab 12/18/15 2335 12/19/15 0042 12/19/15 0807 12/20/15 0530 12/21/15 0512  WBC 20.5*  --  22.9* 16.7*  --   CREATININE 1.01*  --   --  1.11* 0.91  LATICACIDVEN  --  1.8  --   --   --     Estimated Creatinine Clearance: 62.2 mL/min (by C-G formula based on SCr of 0.91 mg/dL).    Allergies  Allergen Reactions  . Sulfa Antibiotics     Other reaction(s): Kidney Disorder    Antimicrobials this admission: Meropenem 11/23 >>  Dose adjustments this admission: N/A  Microbiology results: 11/21 BCx: Ecoli  11/21 UCx: Pan Sensitive E.coli   Pharmacy will continue to monitor and adjust per protocol.    Davyn Elsasser L 12/21/2015 8:25 AM

## 2015-12-21 NOTE — Progress Notes (Signed)
Pt being discharge home with home health, discharge instructions and prescriptions reviewed with pt, daughter and granddaughter, states understanding, pt with no complaints, no distress or discomfort noted

## 2015-12-21 NOTE — Discharge Summary (Signed)
[facility] at [place] NAME: Emma [name]    MR#:  [id number]  DATE OF BIRTH:  01-22-1941  DATE OF ADMISSION:  12/18/2015 ADMITTING PHYSICIAN: Harrie Foreman, MD  DATE OF DISCHARGE: 12/21/2015  PRIMARY CARE PHYSICIAN: [name], MD    ADMISSION DIAGNOSIS:  possible uti right ureteral calculus  DISCHARGE DIAGNOSIS:  Active Problems:   Sepsis ([place])   SECONDARY DIAGNOSIS:   Past Medical History:  Diagnosis Date  . Aromatase inhibitor use   . Breast cancer, left breast ([place]) 06/19/2014  . GERD (gastroesophageal reflux disease)   . Hypertension   . 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  1. Obstructing Right distal infected ureteral stone status post right ureteral stent placement with sepsis and E. coli UTI/BACTEREMIA (from UTI/uretral stone) She will need 2 weeks of antibiotics. She will be discharged with oral ciprofloxacin.   2. Sepsis with leukocytosis and fever on admission due to Escherichia coli UTI and bacteremia. Sepsis has improved  3. Essential hypertension: Continue losartan, HCTZ and labetalol  4. Hypothyroid: Continue Synthroid  5. Metastatic Carcinoid tumor small intestine   6 History of breast cancer on Arimidex   DISCHARGE CONDITIONS AND DIET:   Stable for discharge on heart healthy diet  CONSULTS OBTAINED:  Treatment Team:  Ardis Hughs, MD  DRUG ALLERGIES:   Allergies  Allergen Reactions  . Sulfa Antibiotics     Other reaction(s): Kidney Disorder    DISCHARGE MEDICATIONS:   Current Discharge Medication List    START taking these medications   Details  ciprofloxacin (CIPRO) 500 MG tablet Take 1 tablet (500 mg total) by mouth 2 (two) times  daily. Qty: 24 tablet, Refills: 0      CONTINUE these medications which have NOT CHANGED   Details  anastrozole (ARIMIDEX) 1 MG tablet Take 1 tablet (1 mg total) by mouth daily. Qty: 30 tablet, Refills: 6   Associated Diagnoses: Aromatase inhibitor use    ergocalciferol (VITAMIN D2) 50000 units capsule Take 1 capsule (50,000 Units total) by mouth once a week. Qty: 12 capsule, Refills: 1   Associated Diagnoses: Aromatase inhibitor use    labetalol (NORMODYNE) 300 MG tablet TAKE 1 TABLET (300 MG TOTAL) BY MOUTH 2 (TWO) TIMES DAILY.   Associated Diagnoses: IDA (iron deficiency anemia)    levothyroxine (SYNTHROID) 88 MCG tablet Take 88 mcg by mouth daily before breakfast.    Associated Diagnoses: IDA (iron deficiency anemia)    losartan-hydrochlorothiazide (HYZAAR) 100-25 MG tablet Take 1 tablet by mouth daily.    octreotide (SANDOSTATIN LAR) 30 MG injection Inject 30 mg into the muscle every 28 (twenty-eight) days.    Associated Diagnoses: IDA (iron deficiency anemia)    potassium chloride SA (K-DUR,KLOR-CON) 20 MEQ tablet Take 1 tablet (20 mEq total) by mouth 2 (two) times daily. X 2 weeks Qty: 28 tablet, Refills: 0      STOP taking these medications     cefdinir (OMNICEF) 300 MG capsule               Today   CHIEF COMPLAINT:   Doing well this morning. She has some pain from her ureteral stent   VITAL SIGNS:  Blood pressure (!) 161/60, pulse 76, temperature 98 F (36.7 C), temperature  source Oral, resp. rate (!) 23, height 5\' 5"  (1.651 m), weight 96.1 kg (211 lb 12.8 oz), SpO2 95 %.   REVIEW OF SYSTEMS:  Review of Systems  Constitutional: Negative.  Negative for chills, fever and malaise/fatigue.  HENT: Negative.  Negative for ear discharge, ear pain, hearing loss, nosebleeds and sore throat.   Eyes: Negative.  Negative for blurred vision and pain.  Respiratory: Negative.  Negative for cough, hemoptysis, shortness of breath and wheezing.   Cardiovascular:  Negative.  Negative for chest pain, palpitations and leg swelling.  Gastrointestinal: Negative.  Negative for abdominal pain, blood in stool, diarrhea, nausea and vomiting.  Genitourinary: Negative for dysuria.       Pain from stent  Musculoskeletal: Negative.  Negative for back pain.  Skin: Negative.   Neurological: Negative for dizziness, tremors, speech change, focal weakness, seizures and headaches.  Endo/Heme/Allergies: Negative.  Does not bruise/bleed easily.  Psychiatric/Behavioral: Negative.  Negative for depression, hallucinations and suicidal ideas.     PHYSICAL EXAMINATION:  GENERAL:  75 y.o.-year-old patient lying in the bed with no acute distress.  NECK:  Supple, no jugular venous distention. No thyroid enlargement, no tenderness.  LUNGS: Normal breath sounds bilaterally, no wheezing, rales,rhonchi  No use of accessory muscles of respiration.  CARDIOVASCULAR: S1, S2 normal. No murmurs, rubs, or gallops.  ABDOMEN: Soft, non-tender, non-distended. Bowel sounds present. No organomegaly or mass.  EXTREMITIES: No pedal edema, cyanosis, or clubbing.  PSYCHIATRIC: The patient is alert and oriented x 3.  SKIN: No obvious rash, lesion, or ulcer.   DATA REVIEW:   CBC  Recent Labs Lab 12/20/15 0530  WBC 16.7*  HGB 11.6*  HCT 35.7  PLT 172    Chemistries   Recent Labs Lab 12/18/15 2335  12/21/15 0512  NA 134*  < > 138  K 3.6  < > 3.2*  CL 99*  < > 108  CO2 24  < > 23  GLUCOSE 154*  < > 142*  BUN 14  < > 17  CREATININE 1.01*  < > 0.91  CALCIUM 8.9  < > 8.4*  AST 28  --   --   ALT 15  --   --   ALKPHOS 87  --   --   BILITOT 1.4*  --   --   < > = values in this interval not displayed.  Cardiac Enzymes No results for input(s): TROPONINI in the last 168 hours.  Microbiology Results  @[email or web address] @  RADIOLOGY:  Dg Chest 1 View  Result Date: 12/20/2015 CLINICAL DATA:  Weakness this morning, physician noted crackles on left side of chest this morning. Hx -  Cystoscopy - right side with retrograde pyelogram, urteroscopy with stent placement 12/19/2015 HTN, breast cancer 2014, non-smoker. EXAM: CHEST 1 VIEW COMPARISON:  02/22/2014 FINDINGS: Normal cardiac silhouette. Chronic bronchitic markings. No focal infiltrate. No edema. IMPRESSION: Chronic bronchitic markings.  No clear acute findings Electronically Signed   By: Suzy Bouchard M.D.   On: 12/20/2015 11:41      Management plans discussed with the patient and she is in agreement. Stable for discharge home with [facility]  Patient should follow up with pcp and UROLOGy  CODE STATUS:     Code Status Orders        Start     Ordered   12/19/15 0411  Full code  Continuous     12/19/15 0410    Code Status History    Date Active Date Inactive Code Status Order ID Comments  User Context   08/26/2015  1:23 AM 08/27/2015  8:31 PM Full Code [id number]  Lance Coon, MD ED      TOTAL TIME TAKING CARE OF THIS PATIENT: 36 minutes.    Note: This dictation was prepared with Dragon dictation along with smaller phrase technology. Any transcriptional errors that result from this process are unintentional.  Madysun Thall M.D on 12/21/2015 at 10:53 AM  Between 7am to 6pm - Pager - [phone] After 6pm go to [email or web address] - password [email or web address] [facility]  Office  [phone]  CC: Primary care physician; [name], MD

## 2015-12-22 LAB — CULTURE, BLOOD (ROUTINE X 2)

## 2015-12-24 ENCOUNTER — Inpatient Hospital Stay
Admission: EM | Admit: 2015-12-24 | Discharge: 2015-12-26 | DRG: 293 | Disposition: A | Discharge status: Home Health | Payer: Medicare Other | Attending: Internal Medicine | Admitting: Internal Medicine

## 2015-12-24 ENCOUNTER — Emergency Department: Payer: Medicare Other

## 2015-12-24 ENCOUNTER — Encounter: Payer: Self-pay | Admitting: Emergency Medicine

## 2015-12-24 DIAGNOSIS — Z803 Family history of malignant neoplasm of breast: Secondary | ICD-10-CM | POA: Diagnosis not present

## 2015-12-24 DIAGNOSIS — I5033 Acute on chronic diastolic (congestive) heart failure: Secondary | ICD-10-CM | POA: Diagnosis present

## 2015-12-24 DIAGNOSIS — Z87442 Personal history of urinary calculi: Secondary | ICD-10-CM | POA: Diagnosis not present

## 2015-12-24 DIAGNOSIS — R0601 Orthopnea: Secondary | ICD-10-CM | POA: Diagnosis present

## 2015-12-24 DIAGNOSIS — I110 Hypertensive heart disease with heart failure: Principal | ICD-10-CM | POA: Diagnosis present

## 2015-12-24 DIAGNOSIS — I160 Hypertensive urgency: Secondary | ICD-10-CM | POA: Diagnosis present

## 2015-12-24 DIAGNOSIS — Z882 Allergy status to sulfonamides status: Secondary | ICD-10-CM | POA: Diagnosis not present

## 2015-12-24 DIAGNOSIS — K219 Gastro-esophageal reflux disease without esophagitis: Secondary | ICD-10-CM | POA: Diagnosis present

## 2015-12-24 DIAGNOSIS — Z853 Personal history of malignant neoplasm of breast: Secondary | ICD-10-CM

## 2015-12-24 DIAGNOSIS — E876 Hypokalemia: Secondary | ICD-10-CM | POA: Diagnosis present

## 2015-12-24 DIAGNOSIS — Z79899 Other long term (current) drug therapy: Secondary | ICD-10-CM

## 2015-12-24 DIAGNOSIS — M7989 Other specified soft tissue disorders: Secondary | ICD-10-CM | POA: Diagnosis present

## 2015-12-24 DIAGNOSIS — R262 Difficulty in walking, not elsewhere classified: Secondary | ICD-10-CM

## 2015-12-24 DIAGNOSIS — M6281 Muscle weakness (generalized): Secondary | ICD-10-CM

## 2015-12-24 DIAGNOSIS — I429 Cardiomyopathy, unspecified: Secondary | ICD-10-CM | POA: Diagnosis present

## 2015-12-24 DIAGNOSIS — E782 Mixed hyperlipidemia: Secondary | ICD-10-CM | POA: Diagnosis present

## 2015-12-24 DIAGNOSIS — I509 Heart failure, unspecified: Secondary | ICD-10-CM

## 2015-12-24 DIAGNOSIS — R600 Localized edema: Secondary | ICD-10-CM

## 2015-12-24 DIAGNOSIS — E039 Hypothyroidism, unspecified: Secondary | ICD-10-CM | POA: Diagnosis present

## 2015-12-24 LAB — URINALYSIS COMPLETE WITH MICROSCOPIC (ARMC ONLY)
Bilirubin Urine: NEGATIVE
Glucose, UA: NEGATIVE mg/dL
KETONES UR: NEGATIVE mg/dL
NITRITE: NEGATIVE
PH: 6 (ref 5.0–8.0)
PROTEIN: NEGATIVE mg/dL
SPECIFIC GRAVITY, URINE: 1.011 (ref 1.005–1.030)

## 2015-12-24 LAB — CBC WITH DIFFERENTIAL/PLATELET
BASOS PCT: 1 %
Basophils Absolute: 0 10*3/uL (ref 0–0.1)
EOS ABS: 0.3 10*3/uL (ref 0–0.7)
Eosinophils Relative: 5 %
HEMATOCRIT: 34.9 % — AB (ref 35.0–47.0)
Hemoglobin: 11.4 g/dL — ABNORMAL LOW (ref 12.0–16.0)
Lymphocytes Relative: 9 %
Lymphs Abs: 0.6 10*3/uL — ABNORMAL LOW (ref 1.0–3.6)
MCH: 28 pg (ref 26.0–34.0)
MCHC: 32.8 g/dL (ref 32.0–36.0)
MCV: 85.4 fL (ref 80.0–100.0)
MONO ABS: 0.8 10*3/uL (ref 0.2–0.9)
MONOS PCT: 13 %
Neutro Abs: 4.3 10*3/uL (ref 1.4–6.5)
Neutrophils Relative %: 72 %
Platelets: 228 10*3/uL (ref 150–440)
RBC: 4.09 MIL/uL (ref 3.80–5.20)
RDW: 15.8 % — AB (ref 11.5–14.5)
WBC: 6 10*3/uL (ref 3.6–11.0)

## 2015-12-24 LAB — COMPREHENSIVE METABOLIC PANEL
ALT: 15 U/L (ref 14–54)
ANION GAP: 9 (ref 5–15)
AST: 24 U/L (ref 15–41)
Albumin: 3 g/dL — ABNORMAL LOW (ref 3.5–5.0)
Alkaline Phosphatase: 89 U/L (ref 38–126)
BUN: 10 mg/dL (ref 6–20)
CHLORIDE: 104 mmol/L (ref 101–111)
CO2: 27 mmol/L (ref 22–32)
Calcium: 9.1 mg/dL (ref 8.9–10.3)
Creatinine, Ser: 0.73 mg/dL (ref 0.44–1.00)
GFR calc non Af Amer: >60 mL/min (ref >60)
Glucose, Bld: 111 mg/dL — ABNORMAL HIGH (ref 65–99)
Potassium: 3.7 mmol/L (ref 3.5–5.1)
SODIUM: 140 mmol/L (ref 135–145)
Total Bilirubin: 0.2 mg/dL — ABNORMAL LOW (ref 0.3–1.2)
Total Protein: 6.3 g/dL — ABNORMAL LOW (ref 6.5–8.1)

## 2015-12-24 LAB — TROPONIN I: Troponin I: <0.03 ng/mL (ref <0.03)

## 2015-12-24 LAB — BRAIN NATRIURETIC PEPTIDE: B Natriuretic Peptide: 571 pg/mL — ABNORMAL HIGH (ref 0.0–100.0)

## 2015-12-24 LAB — TSH: TSH: 1.605 u[IU]/mL (ref 0.350–4.500)

## 2015-12-24 MED ORDER — ACETAMINOPHEN 325 MG PO TABS
650.0000 mg | ORAL_TABLET | Freq: Four times a day (QID) | ORAL | Status: DC | PRN
  Administered 2015-12-26: 650 mg via ORAL
  Filled 2015-12-24: qty 2

## 2015-12-24 MED ORDER — ACETAMINOPHEN 650 MG RE SUPP: 650.0000 mg | Freq: Four times a day (QID) | RECTAL | Status: DC | PRN

## 2015-12-24 MED ORDER — LOSARTAN POTASSIUM-HCTZ 100-25 MG PO TABS: 1.0000 | ORAL_TABLET | Freq: Every day | ORAL | Status: DC

## 2015-12-24 MED ORDER — ANASTROZOLE 1 MG PO TABS
1.0000 mg | ORAL_TABLET | Freq: Every day | ORAL | Status: DC
  Administered 2015-12-25 – 2015-12-26 (×2): 1 mg via ORAL
  Filled 2015-12-24 (×3): qty 1

## 2015-12-24 MED ORDER — OXYCODONE HCL 5 MG PO TABS: 5.0000 mg | ORAL_TABLET | ORAL | Status: DC | PRN

## 2015-12-24 MED ORDER — VITAMIN D (ERGOCALCIFEROL) 1.25 MG (50000 UNIT) PO CAPS
50000.0000 [IU] | ORAL_CAPSULE | ORAL | Status: DC
  Administered 2015-12-25: 50000 [IU] via ORAL
  Filled 2015-12-24: qty 1

## 2015-12-24 MED ORDER — LOSARTAN POTASSIUM 50 MG PO TABS
100.0000 mg | ORAL_TABLET | Freq: Every day | ORAL | Status: DC
  Administered 2015-12-25 – 2015-12-26 (×2): 100 mg via ORAL
  Filled 2015-12-24 (×2): qty 2

## 2015-12-24 MED ORDER — ENOXAPARIN SODIUM 40 MG/0.4ML ~~LOC~~ SOLN
40.0000 mg | SUBCUTANEOUS | Status: DC
  Administered 2015-12-25 (×2): 40 mg via SUBCUTANEOUS
  Filled 2015-12-24 (×2): qty 0.4

## 2015-12-24 MED ORDER — FUROSEMIDE 10 MG/ML IJ SOLN
60.0000 mg | Freq: Once | INTRAMUSCULAR | Status: AC
  Administered 2015-12-24: 60 mg via INTRAVENOUS
  Filled 2015-12-24: qty 8

## 2015-12-24 MED ORDER — NITROGLYCERIN 2 % TD OINT
0.5000 [in_us] | TOPICAL_OINTMENT | Freq: Once | TRANSDERMAL | Status: AC
  Administered 2015-12-24: 0.5 [in_us] via TOPICAL
  Filled 2015-12-24: qty 1

## 2015-12-24 MED ORDER — ONDANSETRON HCL 4 MG/2ML IJ SOLN: 4.0000 mg | Freq: Four times a day (QID) | INTRAMUSCULAR | Status: DC | PRN

## 2015-12-24 MED ORDER — SODIUM CHLORIDE 0.9% FLUSH
3.0000 mL | Freq: Two times a day (BID) | INTRAVENOUS | Status: DC
  Administered 2015-12-25 – 2015-12-26 (×3): 3 mL via INTRAVENOUS

## 2015-12-24 MED ORDER — HYDROCHLOROTHIAZIDE 25 MG PO TABS
25.0000 mg | ORAL_TABLET | Freq: Every day | ORAL | Status: DC
  Administered 2015-12-25 – 2015-12-26 (×2): 25 mg via ORAL
  Filled 2015-12-24 (×2): qty 1

## 2015-12-24 MED ORDER — POTASSIUM CHLORIDE CRYS ER 20 MEQ PO TBCR
40.0000 meq | EXTENDED_RELEASE_TABLET | Freq: Once | ORAL | Status: AC
  Administered 2015-12-24: 40 meq via ORAL
  Filled 2015-12-24: qty 2

## 2015-12-24 MED ORDER — ONDANSETRON HCL 4 MG PO TABS
4.0000 mg | ORAL_TABLET | Freq: Four times a day (QID) | ORAL | Status: DC | PRN
  Administered 2015-12-26: 4 mg via ORAL
  Filled 2015-12-24: qty 1

## 2015-12-24 MED ORDER — FUROSEMIDE 10 MG/ML IJ SOLN: 40.0000 mg | Freq: Two times a day (BID) | INTRAMUSCULAR | Status: DC

## 2015-12-24 MED ORDER — LABETALOL HCL 100 MG PO TABS
300.0000 mg | ORAL_TABLET | Freq: Two times a day (BID) | ORAL | Status: DC
  Administered 2015-12-25 – 2015-12-26 (×4): 300 mg via ORAL
  Filled 2015-12-24 (×4): qty 3

## 2015-12-24 MED ORDER — CIPROFLOXACIN HCL 500 MG PO TABS
500.0000 mg | ORAL_TABLET | Freq: Two times a day (BID) | ORAL | Status: DC
  Administered 2015-12-24 – 2015-12-25 (×2): 500 mg via ORAL
  Filled 2015-12-24 (×2): qty 1

## 2015-12-24 MED ORDER — LEVOTHYROXINE SODIUM 88 MCG PO TABS
88.0000 ug | ORAL_TABLET | Freq: Every day | ORAL | Status: DC
  Administered 2015-12-25 – 2015-12-26 (×2): 88 ug via ORAL
  Filled 2015-12-24 (×2): qty 1

## 2015-12-24 MED ORDER — POTASSIUM CHLORIDE CRYS ER 20 MEQ PO TBCR
20.0000 meq | EXTENDED_RELEASE_TABLET | Freq: Two times a day (BID) | ORAL | Status: DC
  Administered 2015-12-25 – 2015-12-26 (×4): 20 meq via ORAL
  Filled 2015-12-24 (×4): qty 1

## 2015-12-24 MED ORDER — HYDRALAZINE HCL 20 MG/ML IJ SOLN
10.0000 mg | INTRAMUSCULAR | Status: DC | PRN
  Administered 2015-12-24: 10 mg via INTRAVENOUS
  Filled 2015-12-24: qty 1

## 2015-12-24 MED ORDER — ASPIRIN EC 81 MG PO TBEC
81.0000 mg | DELAYED_RELEASE_TABLET | Freq: Every day | ORAL | Status: DC
  Administered 2015-12-25 – 2015-12-26 (×2): 81 mg via ORAL
  Filled 2015-12-24 (×2): qty 1

## 2015-12-24 MED ORDER — ERGOCALCIFEROL 1.25 MG (50000 UT) PO CAPS: 50000.0000 [IU] | ORAL_CAPSULE | ORAL | Status: DC

## 2015-12-24 NOTE — ED Triage Notes (Signed)
Pt presents via ems with pitting edema that is new. She recently had procedure and was d/c from hospital on Friday. She has since been confused (but per family that is getting better) and has bilateral leg swelling. Family told EMS that they thought the swelling was caused by antibiotic and they got a new antibiotic today (ceftin) but have not started it yet. Pt alert & oriented; hard of hearing.

## 2015-12-24 NOTE — ED Provider Notes (Signed)
[facility] Emergency Department Provider Note    First MD Initiated Contact with Patient 12/24/15 1748     (approximate)  I have reviewed the triage vital signs and the nursing notes.   HISTORY  Chief Complaint Leg Swelling    HPI Emma [name] is a 75 y.o. female who presents 3 days status post discharge from the hospital for evaluation and workup of UTI with urosepsis likely secondary to stone status post ureteral stent placement. Patient went home on antibiotics. States that since she went home she's been having worsening bilateral lower extremity swelling. Has significant pain in bilateral lower extremities. States that she is also having worsening orthopnea and intermittent chest pain. States that she's feeling dizzy and weak. Also states she has some right upper quadrant abdominal pain. She is status post cholecystectomy area no nausea or vomiting.   Past Medical History:  Diagnosis Date  . Aromatase inhibitor use   . Breast cancer, left breast ([place]) 06/19/2014  . GERD (gastroesophageal reflux disease)   . Hypertension   . 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 Thyroid disease    Family History  Problem Relation Age of Onset  . Breast cancer Mother 29  . Breast cancer Maternal Aunt 68   Past Surgical History:  Procedure Laterality Date  . ABDOMINAL HYSTERECTOMY     partial  . BREAST EXCISIONAL BIOPSY Left 12/2012   +  . BREAST LUMPECTOMY WITH SENTINEL LYMPH NODE BIOPSY Left 2014  . CHOLECYSTECTOMY    . CYSTOSCOPY WITH RETROGRADE PYELOGRAM, URETEROSCOPY AND STENT PLACEMENT Right 12/19/2015   Procedure: CYSTOSCOPY WITH RETROGRADE PYELOGRAM, URETEROSCOPY AND STENT PLACEMENT;  Surgeon: Ardis Hughs, MD;  Location: [facility] ORS;  Service: Urology;  Laterality: Right;   Patient Active Problem List   Diagnosis Date Noted  . Acute on chronic congestive heart failure ([place]) 12/24/2015  . Sepsis ([place]) 12/19/2015  . 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 Fall 08/25/2015  . Left tibial fracture 08/25/2015  . Anxiety 08/25/2015  . GERD (gastroesophageal reflux disease) 08/25/2015  . HTN (hypertension) 08/25/2015  . Hypothyroidism 08/25/2015  . IDA (iron deficiency anemia) 06/19/2014  . Breast cancer, left breast ([place]) 06/19/2014  . 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 Authorizing Provider  anastrozole (ARIMIDEX) 1 MG tablet Take 1 tablet (1 mg total) by mouth daily. 11/06/15  Yes [name], MD  cefUROXime (CEFTIN) 500 MG tablet Take 1 tablet by mouth 2 (two) times daily. 12/24/15  Yes Historical Provider, MD  ciprofloxacin (CIPRO) 500 MG tablet Take 1 tablet (500 mg total) by mouth 2 (two) times daily. 12/21/15 01/02/16 Yes Sital Mody, MD  ergocalciferol (VITAMIN D2) 50000 units capsule Take 1 capsule (50,000 Units total) by mouth once a week. 11/06/15  Yes [name], MD  HYDROcodone-acetaminophen (NORCO) 5-325 MG tablet Take 1 tablet by mouth every 6 (six) hours as needed for moderate pain. 12/21/15  Yes Nicholes Mango, MD  labetalol (NORMODYNE) 300 MG tablet TAKE 1 TABLET (300 MG TOTAL) BY MOUTH 2 (TWO) TIMES DAILY. 08/09/14  Yes Historical Provider, MD  levothyroxine (SYNTHROID) 88 MCG tablet Take 88 mcg by mouth daily before breakfast.  06/20/14  Yes Historical Provider, MD  losartan-hydrochlorothiazide (HYZAAR) 100-25 MG tablet Take 1 tablet by mouth daily.   Yes Historical Provider, MD  potassium chloride SA (K-DUR,KLOR-CON) 20 MEQ tablet Take 1 tablet (20 mEq total)  by mouth 2 (two) times daily. X 2 weeks 12/07/15  Yes [name], MD  octreotide (SANDOSTATIN LAR) 30 MG injection Inject 30 mg into the muscle every 28 (twenty-eight) days.     Historical Provider, MD    Allergies Sulfa antibiotics    Social History Social History  Substance Use Topics  . Smoking status: Never Smoker  . Smokeless  tobacco: Never Used  . Alcohol use No    Review of Systems Patient denies headaches, rhinorrhea, blurry vision, numbness, shortness of breath, chest pain, edema, cough, abdominal pain, nausea, vomiting, diarrhea, dysuria, fevers, rashes or hallucinations unless otherwise stated above in HPI. ____________________________________________   PHYSICAL EXAM:  VITAL SIGNS: Vitals:   12/24/15 1753 12/24/15 1930  BP: (!) 208/103 (!) 214/79  Pulse: (!) 59 61  Resp: 13 17  Temp: 97.8 F (36.6 C)     Constitutional: Alert and oriented. in no acute distress. Eyes: Conjunctivae are normal. PERRL. EOMI. Head: Atraumatic. Nose: No congestion/rhinnorhea. Mouth/Throat: Mucous membranes are moist.  Oropharynx non-erythematous. Neck: No stridor. Painless ROM. No cervical spine tenderness to palpation Hematological/Lymphatic/Immunilogical: No cervical lymphadenopathy. Cardiovascular: Normal rate, regular rhythm. Grossly normal heart sounds.  Good peripheral circulation. Respiratory: Normal respiratory effort.  No retractions. Diminished breath sounds bilaterally. Gastrointestinal: Soft and non-tender in all four quadrants. No distention. No abdominal bruits. No CVA tenderness. Musculoskeletal: No lower extremity tenderness, marked bilateral pitting edema.  No joint effusions. Neurologic:  Normal speech and language. No gross focal neurologic deficits are appreciated. No gait instability. Skin:  Skin is warm, dry and intact. No rash noted. Psychiatric: Mood and affect are normal. Speech and behavior are normal.  ____________________________________________   LABS (all labs ordered are listed, but only abnormal results are displayed)  Results for orders placed or performed during the hospital encounter of 12/24/15 (from the past 24 hour(s))  Comprehensive metabolic panel     Status: Abnormal   Collection Time: 12/24/15  5:54 PM  Result Value Ref Range   Sodium 140 135 - 145 mmol/L   Potassium  3.7 3.5 - 5.1 mmol/L   Chloride 104 101 - 111 mmol/L   CO2 27 22 - 32 mmol/L   Glucose, Bld 111 (H) 65 - 99 mg/dL   BUN 10 6 - 20 mg/dL   Creatinine, Ser 0.73 0.44 - 1.00 mg/dL   Calcium 9.1 8.9 - 10.3 mg/dL   Total Protein 6.3 (L) 6.5 - 8.1 g/dL   Albumin 3.0 (L) 3.5 - 5.0 g/dL   AST 24 15 - 41 U/L   ALT 15 14 - 54 U/L   Alkaline Phosphatase 89 38 - 126 U/L   Total Bilirubin 0.2 (L) 0.3 - 1.2 mg/dL   GFR calc non Af Amer >60 >60 mL/min   GFR calc Af Amer >60 >60 mL/min   Anion gap 9 5 - 15  Troponin I     Status: None   Collection Time: 12/24/15  5:54 PM  Result Value Ref Range   Troponin I <0.03 <0.03 ng/mL  Urinalysis complete, with microscopic ([facility] only)     Status: Abnormal   Collection Time: 12/24/15  5:54 PM  Result Value Ref Range   Color, Urine YELLOW (A) YELLOW   APPearance CLEAR (A) CLEAR   Glucose, UA NEGATIVE NEGATIVE mg/dL   Bilirubin Urine NEGATIVE NEGATIVE   Ketones, ur NEGATIVE NEGATIVE mg/dL   Specific Gravity, Urine 1.011 1.005 - 1.030   Hgb urine dipstick 3+ (A) NEGATIVE   pH 6.0 5.0 -  8.0   Protein, ur NEGATIVE NEGATIVE mg/dL   Nitrite NEGATIVE NEGATIVE   Leukocytes, UA 2+ (A) NEGATIVE   RBC / HPF TOO NUMEROUS TO COUNT 0 - 5 RBC/hpf   WBC, UA 6-30 0 - 5 WBC/hpf   Bacteria, UA RARE (A) NONE SEEN   Squamous Epithelial / LPF 0-5 (A) NONE SEEN   Mucous PRESENT   Brain natriuretic peptide     Status: Abnormal   Collection Time: 12/24/15  5:54 PM  Result Value Ref Range   B Natriuretic Peptide 571.0 (H) 0.0 - 100.0 pg/mL  CBC with Differential     Status: Abnormal   Collection Time: 12/24/15  5:54 PM  Result Value Ref Range   WBC 6.0 3.6 - 11.0 K/uL   RBC 4.09 3.80 - 5.20 MIL/uL   Hemoglobin 11.4 (L) 12.0 - 16.0 g/dL   HCT 34.9 (L) 35.0 - 47.0 %   MCV 85.4 80.0 - 100.0 fL   MCH 28.0 26.0 - 34.0 pg   MCHC 32.8 32.0 - 36.0 g/dL   RDW 15.8 (H) 11.5 - 14.5 %   Platelets 228 150 - 440 K/uL   Neutrophils Relative % 72 %   Neutro Abs 4.3 1.4 - 6.5 K/uL    Lymphocytes Relative 9 %   Lymphs Abs 0.6 (L) 1.0 - 3.6 K/uL   Monocytes Relative 13 %   Monocytes Absolute 0.8 0.2 - 0.9 K/uL   Eosinophils Relative 5 %   Eosinophils Absolute 0.3 0 - 0.7 K/uL   Basophils Relative 1 %   Basophils Absolute 0.0 0 - 0.1 K/uL   ____________________________________________  EKG My review and personal interpretation at Time: 17:51   Indication: chest pain  Rate: 60  Rhythm: sinus Axis: normal Other: non specific t wave inversions in lateral leads.  No ST elevations ____________________________________________  RADIOLOGY  I personally reviewed all radiographic images ordered to evaluate for the above acute complaints and reviewed radiology reports and findings.  These findings were personally discussed with the patient.  Please see medical record for radiology report.  ____________________________________________   PROCEDURES  Procedure(s) performed: none Procedures    Critical Care performed: no ____________________________________________   INITIAL IMPRESSION / ASSESSMENT AND PLAN / ED COURSE  Pertinent labs & imaging results that were available during my care of the patient were reviewed by me and considered in my medical decision making (see chart for details).  DDX: Asthma, copd, CHF, pna, ptx, malignancy, Pe, anemia   VERNIE VINCIGUERRA is a 75 y.o. who presents to the ED with complaint of bilateral lower extremity edema intermittent chest pain and weakness described above. Patient arrives afebrile but hypertensive. She does appear well perfused. EKG has nonspecific changes but no evidence of ST elevation MI. Presentation is concerning for new onset congestive heart failure versus renal disease given her recent admission for sepsis. We'll further evaluated with laboratory evaluation and radiographic imaging.  The patient will be placed on continuous pulse oximetry and telemetry for monitoring.  Laboratory evaluation will be sent to evaluate  for the above complaints.     Clinical Course    ----------------------------------------- 7:53 PM on 12/24/2015 -----------------------------------------   Blood pressure is improving. Have given 60 mg of IV Lasix for 4 evidence of acute congestive heart failure with both lower extremity edema as well as interstitial edema. Based on her orthopnea, weakness and fatigue do feel patient will require admission for further evaluation and management.  ____________________________________________   FINAL CLINICAL IMPRESSION(S) / ED  DIAGNOSES  Final diagnoses:  Acute congestive heart failure, unspecified congestive heart failure type (HCC)  Orthopnea  Bilateral lower extremity edema      NEW MEDICATIONS STARTED DURING THIS VISIT:  New Prescriptions   No medications on file     Note:  This document was prepared using Dragon voice recognition software and may include unintentional dictation errors.    Merlyn Lot, MD 12/24/15 2127

## 2015-12-24 NOTE — H&P (Signed)
[place] [facility] [place] NAME: Emma [name]    MR#:  [id number]  DATE OF BIRTH:  Dec 10, 1940   DATE OF ADMISSION:  12/24/2015  PRIMARY CARE PHYSICIAN: [name], MD   REQUESTING/REFERRING PHYSICIAN: Quentin Cornwall  CHIEF COMPLAINT:   Chief Complaint  Patient presents with  . Leg Swelling    HISTORY OF PRESENT ILLNESS:  Emma [name]  is a 75 y.o. female with a known history of breast cancer essential hypertension presenting with leg swelling. She is a rather poor historian but describes worsening lower extremity edema over the past few days. Of note she is recently discharged from the hospital for sepsis complicated by right ureteral stone  Denies any shortness of breath, orthopnea by multiple questions she responds "I've been piecing it altogether" but unable to provide any information PAST MEDICAL HISTORY:   Past Medical History:  Diagnosis Date  . Aromatase inhibitor use   . Breast cancer, left breast ([place]) 06/19/2014  . GERD (gastroesophageal reflux disease)   . Hypertension   . 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 Thyroid disease     PAST SURGICAL HISTORY:   Past Surgical History:  Procedure Laterality Date  . ABDOMINAL HYSTERECTOMY     partial  . BREAST EXCISIONAL BIOPSY Left 12/2012   +  . BREAST LUMPECTOMY WITH SENTINEL LYMPH NODE BIOPSY Left 2014  . CHOLECYSTECTOMY    . CYSTOSCOPY WITH RETROGRADE PYELOGRAM, URETEROSCOPY AND STENT PLACEMENT Right 12/19/2015   Procedure: CYSTOSCOPY WITH RETROGRADE PYELOGRAM, URETEROSCOPY AND STENT PLACEMENT;  Surgeon: Ardis Hughs, MD;  Location: [facility] ORS;  Service: Urology;  Laterality: Right;    SOCIAL HISTORY:   Social History  Substance Use Topics  . Smoking status: Never Smoker  . Smokeless tobacco: Never Used  . Alcohol use No    FAMILY HISTORY:   Family History  Problem Relation Age of Onset  . Breast cancer Mother 56  . Breast cancer Maternal Aunt 68    DRUG  ALLERGIES:   Allergies  Allergen Reactions  . Sulfa Antibiotics     Other reaction(s): Kidney Disorder    REVIEW OF SYSTEMS:  REVIEW OF SYSTEMS:  CONSTITUTIONAL: Denies fevers, chills, Positive fatigue, weakness.  EYES: Denies blurred vision, double vision, or eye pain.  EARS, NOSE, THROAT: Denies tinnitus, ear pain, hearing loss.  RESPIRATORY: denies cough, shortness of breath, wheezing  CARDIOVASCULAR: Denies chest pain, palpitations, positive edema.  GASTROINTESTINAL: Denies nausea, vomiting, diarrhea, abdominal pain.  GENITOURINARY: Denies dysuria, hematuria.  ENDOCRINE: Denies nocturia or thyroid problems. HEMATOLOGIC AND LYMPHATIC: Denies easy bruising or bleeding.  SKIN: Denies rash or lesions.  MUSCULOSKELETAL: Denies pain in neck, back, shoulder, knees, hips, or further arthritic symptoms.  NEUROLOGIC: Denies paralysis, paresthesias.  PSYCHIATRIC: Denies anxiety or depressive symptoms. Otherwise full review of systems performed by me is negative.   MEDICATIONS AT HOME:   Prior to Admission medications   Medication Sig Start Date End Date Taking? Authorizing Provider  anastrozole (ARIMIDEX) 1 MG tablet Take 1 tablet (1 mg total) by mouth daily. 11/06/15   [name], MD  ciprofloxacin (CIPRO) 500 MG tablet Take 1 tablet (500 mg total) by mouth 2 (two) times daily. 12/21/15 01/02/16  Bettey Costa, MD  ergocalciferol (VITAMIN D2) 50000 units capsule Take 1 capsule (50,000 Units total) by mouth once a week. 11/06/15   [name], MD  HYDROcodone-acetaminophen (NORCO) 5-325 MG tablet Take 1 tablet by mouth every 6 (six) hours as needed for moderate pain.  12/21/15   Nicholes Mango, MD  labetalol (NORMODYNE) 300 MG tablet TAKE 1 TABLET (300 MG TOTAL) BY MOUTH 2 (TWO) TIMES DAILY. 08/09/14   Historical Provider, MD  levothyroxine (SYNTHROID) 88 MCG tablet Take 88 mcg by mouth daily before breakfast.  06/20/14   Historical Provider, MD  losartan-hydrochlorothiazide  (HYZAAR) 100-25 MG tablet Take 1 tablet by mouth daily.    Historical Provider, MD  octreotide (SANDOSTATIN LAR) 30 MG injection Inject 30 mg into the muscle every 28 (twenty-eight) days.     Historical Provider, MD  potassium chloride SA (K-DUR,KLOR-CON) 20 MEQ tablet Take 1 tablet (20 mEq total) by mouth 2 (two) times daily. X 2 weeks 12/07/15   [name], MD      VITAL SIGNS:  Blood pressure (!) 214/79, pulse 61, temperature 97.8 F (36.6 C), temperature source Oral, resp. rate 17, height 5\' 5"  (1.651 m), weight 95.7 kg (211 lb), SpO2 96 %.  PHYSICAL EXAMINATION:  VITAL SIGNS: Vitals:   12/24/15 1753 12/24/15 1930  BP: (!) 208/103 (!) 214/79  Pulse: (!) 59 61  Resp: 13 17  Temp: 97.8 F (36.6 C)    GENERAL:75 y.o.female currently in no acute distress.  HEAD: Normocephalic, atraumatic.  EYES: Pupils equal, round, reactive to light. Extraocular muscles intact. No scleral icterus.  MOUTH: Moist mucosal membrane. Dentition intact. No abscess noted.  EAR, NOSE, THROAT: Clear without exudates. No external lesions.  NECK: Supple. No thyromegaly. No nodules. No JVD.  PULMONARY: Diminished breath sounds throughout with scattered basal rhonchi without wheeze rails or rhonci. No use of accessory muscles, Good respiratory effort. good air entry bilaterally CHEST: Nontender to palpation.  CARDIOVASCULAR: S1 and S2. Regular rate and rhythm. No murmurs, rubs, or gallops. 2+ edema. Pedal pulses 2+ bilaterally.  GASTROINTESTINAL: Soft, nontender, nondistended. No masses. Positive bowel sounds. No hepatosplenomegaly.  MUSCULOSKELETAL: No swelling, clubbing, or edema. Range of motion full in all extremities.  NEUROLOGIC: Cranial nerves II through XII are intact. No gross focal neurological deficits. Sensation intact. Reflexes intact.  SKIN: No ulceration, lesions, rashes, or cyanosis. Skin warm and dry. Turgor intact.  PSYCHIATRIC: Mood, affect within normal limits. The patient is awake,  alert and oriented x 3. Insight, judgment intact.    LABORATORY PANEL:   CBC  Recent Labs Lab 12/24/15 1754  WBC 6.0  HGB 11.4*  HCT 34.9*  PLT 228   ------------------------------------------------------------------------------------------------------------------  Chemistries   Recent Labs Lab 12/24/15 1754  NA 140  K 3.7  CL 104  CO2 27  GLUCOSE 111*  BUN 10  CREATININE 0.73  CALCIUM 9.1  AST 24  ALT 15  ALKPHOS 89  BILITOT 0.2*   ------------------------------------------------------------------------------------------------------------------  Cardiac Enzymes  Recent Labs Lab 12/24/15 1754  TROPONINI <0.03   ------------------------------------------------------------------------------------------------------------------  RADIOLOGY:  Dg Chest 1 View  Result Date: 12/24/2015 CLINICAL DATA:  Bilateral leg swelling, chest pain, and shortness of breath. History of left breast cancer, hypertension. Discharged from the hospital on Friday after a cysto procedure. EXAM: CHEST 1 VIEW COMPARISON:  12/20/2015 FINDINGS: The heart is enlarged. There is prominence of interstitial markings consistent with interstitial pulmonary edema. Small bilateral pleural effusions are suspected. No focal consolidations are identified. IMPRESSION: Cardiomegaly and mild interstitial edema. Electronically Signed   By: Nolon Nations M.D.   On: 12/24/2015 18:15    EKG:   Orders placed or performed during the hospital encounter of 12/24/15  . ED EKG  . ED EKG  . EKG 12-Lead  . EKG 12-Lead  IMPRESSION AND PLAN:   74 year old Caucasian female history of essential hypertension who is presenting with lower extremity edema she is a rather poor historian K give any actual timeframe for this  1. Hypertensive urgency: Restart home medications, add as needed hydralazine 2. Acute on chronic congestive heart failure, unspecified ejection fraction: Likely worsened by the above, IV  diuresis check echocardiogram she is already on ACE inhibitor and beta blocker-can adjust her beta blocker once ejection fraction is known 3. Recent sepsis secondary to ureteral stone/Escherichia coli bacteremia continue with Cipro-will need about 2 weeks 4. Hypothyroidism unspecified Synthroid    All the records are reviewed and case discussed with ED provider. Management plans discussed with the patient, family and they are in agreement.  CODE STATUS: full  TOTAL TIME TAKING CARE OF THIS PATIENT: 33 minutes.    Khala Tarte,  Karenann Cai.D on 12/24/2015 at 8:39 PM  Between 7am to 6pm - Pager - [phone]  After 6pm: House Pager: - [phone]  [facility]  Office  [phone]  CC: Primary care physician; [name], MD

## 2015-12-25 ENCOUNTER — Inpatient Hospital Stay
Admit: 2015-12-25 | Discharge: 2015-12-25 | Disposition: A | Discharge status: Home / Self Care | Payer: Medicare Other | Attending: Internal Medicine | Admitting: Internal Medicine

## 2015-12-25 LAB — BASIC METABOLIC PANEL
Anion gap: 9 (ref 5–15)
BUN: 9 mg/dL (ref 6–20)
CALCIUM: 9.3 mg/dL (ref 8.9–10.3)
CO2: 30 mmol/L (ref 22–32)
CREATININE: 0.82 mg/dL (ref 0.44–1.00)
Chloride: 100 mmol/L — ABNORMAL LOW (ref 101–111)
GFR calc Af Amer: >60 mL/min (ref >60)
GFR calc non Af Amer: >60 mL/min (ref >60)
GLUCOSE: 134 mg/dL — AB (ref 65–99)
Potassium: 3.6 mmol/L (ref 3.5–5.1)
Sodium: 139 mmol/L (ref 135–145)

## 2015-12-25 LAB — ECHOCARDIOGRAM COMPLETE
Height: 65 in
Weight: 3172.8 oz

## 2015-12-25 MED ORDER — CEFUROXIME AXETIL 500 MG PO TABS
500.0000 mg | ORAL_TABLET | Freq: Two times a day (BID) | ORAL | Status: DC
  Administered 2015-12-25 – 2015-12-26 (×2): 500 mg via ORAL
  Filled 2015-12-25 (×2): qty 1

## 2015-12-25 MED ORDER — FUROSEMIDE 10 MG/ML IJ SOLN: 40.0000 mg | Freq: Two times a day (BID) | INTRAMUSCULAR | Status: DC

## 2015-12-25 NOTE — Consult Note (Signed)
[facility] Consultation Note  Patient ID: Emma [name], MRN: [id number], DOB/AGE: Jun 04, 1940 75 y.o. Admit date: 12/24/2015   Date of Consult: 12/25/2015 Primary Physician: [name], MD Primary Cardiologist: Paraschos  Chief Complaint:  Chief Complaint  Patient presents with  . Leg Swelling   Reason for Consult: acute congestive heart failure  HPI: 75 y.o. female with history of essential hypertension and mixed hyperlipidemia who is had appropriate treatment over the years and is still has been stable. The patient additionally has apparent breast cancer in the left breast for which she is received appropriate treatment as well. Recently she has had a significant increase in lower extremity edema pulmonary edema shortness of breath with physical activity and occasional chest pressure off and on over the last week or 2 culminating to the extent that the patient was seen in the emergency room. At that time the patient had some pulmonary edema and an EKG showing normal sinus rhythm with the anterior precordial T-wave inversion. There was no evidence of myocardial infarction with a troponin normal troponin. Patient has had some improvements with appropriate medication management including intravenous furosemide. The patient currently is overall improved also with appropriate labetalol and losartan or hypertension control  Past Medical History:  Diagnosis Date  . Aromatase inhibitor use   . Breast cancer, left breast ([place]) 06/19/2014  . GERD (gastroesophageal reflux disease)   . Hypertension   . 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 Thyroid disease       Surgical History:  Past Surgical History:  Procedure Laterality Date  . ABDOMINAL HYSTERECTOMY     partial  . BREAST EXCISIONAL BIOPSY Left 12/2012   +  . BREAST LUMPECTOMY WITH SENTINEL LYMPH NODE BIOPSY Left 2014  . CHOLECYSTECTOMY    . CYSTOSCOPY WITH RETROGRADE PYELOGRAM, URETEROSCOPY AND STENT PLACEMENT Right  12/19/2015   Procedure: CYSTOSCOPY WITH RETROGRADE PYELOGRAM, URETEROSCOPY AND STENT PLACEMENT;  Surgeon: Ardis Hughs, MD;  Location: [facility] ORS;  Service: Urology;  Laterality: Right;     Home Meds: Prior to Admission medications   Medication Sig Start Date End Date Taking? Authorizing Provider  anastrozole (ARIMIDEX) 1 MG tablet Take 1 tablet (1 mg total) by mouth daily. 11/06/15  Yes [name], MD  cefUROXime (CEFTIN) 500 MG tablet Take 1 tablet by mouth 2 (two) times daily. 12/24/15  Yes Historical Provider, MD  ciprofloxacin (CIPRO) 500 MG tablet Take 1 tablet (500 mg total) by mouth 2 (two) times daily. 12/21/15 01/02/16 Yes Sital Mody, MD  ergocalciferol (VITAMIN D2) 50000 units capsule Take 1 capsule (50,000 Units total) by mouth once a week. 11/06/15  Yes [name], MD  HYDROcodone-acetaminophen (NORCO) 5-325 MG tablet Take 1 tablet by mouth every 6 (six) hours as needed for moderate pain. 12/21/15  Yes Nicholes Mango, MD  labetalol (NORMODYNE) 300 MG tablet TAKE 1 TABLET (300 MG TOTAL) BY MOUTH 2 (TWO) TIMES DAILY. 08/09/14  Yes Historical Provider, MD  levothyroxine (SYNTHROID) 88 MCG tablet Take 88 mcg by mouth daily before breakfast.  06/20/14  Yes Historical Provider, MD  losartan-hydrochlorothiazide (HYZAAR) 100-25 MG tablet Take 1 tablet by mouth daily.   Yes Historical Provider, MD  potassium chloride SA (K-DUR,KLOR-CON) 20 MEQ tablet Take 1 tablet (20 mEq total) by mouth 2 (two) times daily. X 2 weeks 12/07/15  Yes [name], MD  octreotide (SANDOSTATIN LAR) 30 MG injection Inject 30 mg into the muscle every 28 (twenty-eight) days.     Historical Provider, MD  Inpatient Medications:  . anastrozole  1 mg Oral Daily  . aspirin EC  81 mg Oral Daily  . ciprofloxacin  500 mg Oral BID  . enoxaparin (LOVENOX) injection  40 mg Subcutaneous Q24H  . [START ON 12/26/2015] furosemide  40 mg Intravenous Q12H  . losartan  100 mg Oral Daily   And  .  hydrochlorothiazide  25 mg Oral Daily  . labetalol  300 mg Oral BID  . levothyroxine  88 mcg Oral QAC breakfast  . potassium chloride SA  20 mEq Oral BID  . sodium chloride flush  3 mL Intravenous Q12H  . Vitamin D (Ergocalciferol)  50,000 Units Oral Q7 days     Allergies:  Allergies  Allergen Reactions  . Sulfa Antibiotics     Other reaction(s): Kidney Disorder    Social History   Social History  . Marital status: Widowed    Spouse name: N/A  . Number of children: N/A  . Years of education: N/A   Occupational History  . Not on file.   Social History Main Topics  . Smoking status: Never Smoker  . Smokeless tobacco: Never Used  . Alcohol use No  . Drug use: No  . Sexual activity: Not on file   Other Topics Concern  . Not on file   Social History Narrative  . No narrative on file     Family History  Problem Relation Age of Onset  . Breast cancer Mother 29  . Breast cancer Maternal Aunt 68     Review of Systems Positive for Shortness of breath PND orthopnea Negative for: General:  chills, fever, night sweats or weight changes.  Cardiovascular: Positive for PND orthopnea negative for syncope dizziness  Dermatological skin lesions rashes Respiratory: Cough congestion Urologic: Frequent urination urination at night and hematuria Abdominal: negative for nausea, vomiting, diarrhea, bright red blood per rectum, melena, or hematemesis Neurologic: negative for visual changes, and/or hearing changes  All other systems reviewed and are otherwise negative except as noted above.  Labs:  Recent Labs  12/24/15 1754  TROPONINI <0.03   Lab Results  Component Value Date   WBC 6.0 12/24/2015   HGB 11.4 (L) 12/24/2015   HCT 34.9 (L) 12/24/2015   MCV 85.4 12/24/2015   PLT 228 12/24/2015    Recent Labs Lab 12/24/15 1754 12/25/15 0543  NA 140 139  K 3.7 3.6  CL 104 100*  CO2 27 30  BUN 10 9  CREATININE 0.73 0.82  CALCIUM 9.1 9.3  PROT 6.3*  --   BILITOT  0.2*  --   ALKPHOS 89  --   ALT 15  --   AST 24  --   GLUCOSE 111* 134*   No results found for: CHOL, HDL, LDLCALC, TRIG No results found for: DDIMER  Radiology/Studies:  Dg Chest 1 View  Result Date: 12/24/2015 CLINICAL DATA:  Bilateral leg swelling, chest pain, and shortness of breath. History of left breast cancer, hypertension. Discharged from the hospital on Friday after a cysto procedure. EXAM: CHEST 1 VIEW COMPARISON:  12/20/2015 FINDINGS: The heart is enlarged. There is prominence of interstitial markings consistent with interstitial pulmonary edema. Small bilateral pleural effusions are suspected. No focal consolidations are identified. IMPRESSION: Cardiomegaly and mild interstitial edema. Electronically Signed   By: Nolon Nations M.D.   On: 12/24/2015 18:15   Dg Chest 1 View  Result Date: 12/20/2015 CLINICAL DATA:  Weakness this morning, physician noted crackles on left side of chest this morning. Hx -  Cystoscopy - right side with retrograde pyelogram, urteroscopy with stent placement 12/19/2015 HTN, breast cancer 2014, non-smoker. EXAM: CHEST 1 VIEW COMPARISON:  02/22/2014 FINDINGS: Normal cardiac silhouette. Chronic bronchitic markings. No focal infiltrate. No edema. IMPRESSION: Chronic bronchitic markings.  No clear acute findings Electronically Signed   By: Suzy Bouchard M.D.   On: 12/20/2015 11:41   Ct Renal Stone Study  Result Date: 12/19/2015 CLINICAL DATA:  Acute onset of dysuria and generalized abdominal pain. Nausea. Initial encounter. EXAM: CT ABDOMEN AND PELVIS WITHOUT CONTRAST TECHNIQUE: Multidetector CT imaging of the abdomen and pelvis was performed following the standard protocol without IV contrast. COMPARISON:  CT of the abdomen and pelvis performed 01/31/2014 FINDINGS: Lower chest: Trace pleural fluid is noted bilaterally, with mild bibasilar atelectasis. Scattered coronary artery calcifications are seen. The heart is mildly enlarged. Hepatobiliary: The liver  is unremarkable in appearance. The patient is status post cholecystectomy, with clips noted at the gallbladder fossa. The common bile duct remains normal in caliber. Pancreas: The pancreas is within normal limits. Spleen: A peripherally calcified splenic cystic lesion is noted at the anterior aspect of the spleen, measuring 3.5 cm. Adrenals/Urinary Tract: The adrenal glands are unremarkable in appearance. There is incomplete rotation of the right kidney. Mild right-sided hydronephrosis is noted, with prominence of the right ureter along its entire course, to the level of an obstructing 4 x 3 mm stone at the distal right ureter, just above the right vesicoureteral junction. A small nonobstructing 2 mm stone is noted at the posterior aspect of the right kidney. Stomach/Bowel: The stomach is unremarkable in appearance. The small bowel is within normal limits. The appendix is normal in caliber, without evidence of appendicitis. The colon is unremarkable in appearance. Note is again made of a spiculated retractile mass at the central small bowel mesentery, demonstrating focal calcifications, and measuring 7.1 cm in maximal dimension. 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 Vascular/Lymphatic: Scattered calcification is seen along the abdominal aorta and its branches. The abdominal aorta is otherwise grossly unremarkable. The inferior vena cava is grossly unremarkable. No retroperitoneal lymphadenopathy is seen. No pelvic sidewall lymphadenopathy is identified. Reproductive: The bladder is mildly distended. The uterus is grossly unremarkable. A 5.4 cm mass at the right adnexa is stable from 2010 and benign in appearance. 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 Other: No additional soft tissue abnormalities are seen. Musculoskeletal: No acute osseous abnormalities are identified. The  visualized musculature is unremarkable in appearance. IMPRESSION: 1. Mild right-sided hydronephrosis, with an obstructing 4 x 3 mm stone noted at the distal right ureter, just above the right vesicoureteral junction. 2. Spiculated retractile mass at the central small bowel mesentery demonstrates focal calcifications, measuring up to 7.1 cm in maximal dimension. 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 3. 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 4. Stable peripherally calcified cystic lesion at the anterior spleen is likely benign. 5. 2 mm nonobstructing stone at the posterior aspect of the right kidney. 6. Trace bilateral pleural fluid, with mild bibasilar atelectasis. 7. Scattered coronary artery calcifications.  Mild cardiomegaly. 8. Scattered aortic atherosclerosis. Electronically Signed   By: Garald Balding M.D.   On: 12/19/2015 02:28    EKG: Normal sinus rhythm with  lateral T-wave inversion  Weights: Filed Weights   12/24/15 1754 12/24/15 2316 12/25/15 0451  Weight: 95.7 kg (211 lb) 89.9 kg (198 lb 3.2 oz) 89.9 kg (198 lb 4.8 oz)     Physical Exam: Blood pressure (!) 151/58, pulse 66, temperature 97.9 F (36.6 C), temperature source Oral, resp. rate 18, height 5\' 5"  (1.651 m), weight 89.9 kg (198 lb 4.8 oz), SpO2 93 %. Body mass index is 33 kg/m. General: Well developed, well nourished, in no acute distress. Head eyes ears nose throat: Normocephalic, atraumatic, sclera non-icteric, no xanthomas, nares are without discharge. No apparent thyromegaly and/or mass  Lungs: Normal respiratory effort.  no wheezes, basilar rales, no rhonchi.  Heart: RRR with normal S1 S2. no murmur gallop, no rub, PMI is normal size and placement, carotid upstroke normal without bruit, jugular venous pressure is normal Abdomen: Soft, non-tender,  non-distended with normoactive bowel sounds. No hepatomegaly. No rebound/guarding. No obvious abdominal masses. Abdominal aorta is normal size without bruit Extremities: 1+ edema. no cyanosis, no clubbing, no ulcers  Peripheral : 2+ bilateral upper extremity pulses, 2+ bilateral femoral pulses, 1 + bilateral dorsal pedal pulse Neuro: Alert and oriented. No facial asymmetry. No focal deficit. Moves all extremities spontaneously. Musculoskeletal: Normal muscle tone without kyphosis Psych:  Responds to questions appropriately with a normal affect.    Assessment: 75 year old female with essential hypertension mixed hyperlipidemia and breast cancer status post appropriate treatment having an acute onset congestive heart failure without evidence of myocardial infarction  Plan: 1. Continue intravenous Lasix and changed to oral Lasix when able 2. Echocardiogram for LV systolic dysfunction valvular heart disease contributing to above 3. Continue beta blocker angiotensin receptor blocker for further treatment of cardiomyopathy 4. Further diagnostic testing and treatment options after above  Signed, Corey Skains M.D. [facility] 12/25/2015, 8:19 AM

## 2015-12-25 NOTE — Evaluation (Signed)
Physical Therapy Evaluation Patient Details Name: Emma [name] MRN: [id number] DOB: 05-16-1940 Today's Date: 12/25/2015   History of Present Illness  75 y/o female recently here with AMS and R flank pain, found to have R kidney stone. History of proximal L tibia fx, managed non-operatively in July.  Now returns with acute on chronic CHF with b/l LE edema.   Clinical Impression  Pt is able to ambulate with relatively consistent and confident cadence after some initial hesitancy.  She did well and with bed mobility and transfers and though she is likely not at her baseline she should be able to return home with HHPT and supervision.  Pt with some confusion t/o the session, but overall did well and was able to participate appropriately.     Follow Up Recommendations Home health PT    Equipment Recommendations       Recommendations for Other Services       Precautions / Restrictions Precautions Precautions: Fall Restrictions Weight Bearing Restrictions: No      Mobility  Bed Mobility Overal bed mobility: Needs Assistance Bed Mobility: Supine to Sit     Supine to sit: Min guard     General bed mobility comments: Pt did well getting to EOB, did not need assist  to get to upright  Transfers Overall transfer level: Modified independent Equipment used: Rolling walker (2 wheeled) Transfers: Sit to/from Stand Sit to Stand: Min guard         General transfer comment: Pt initially hesistant to stand, but was able to rise with light UE use and maintain balance with the walker assistance  Ambulation/Gait Ambulation/Gait assistance: Supervision Ambulation Distance (Feet): 60 Feet Assistive device: Rolling walker (2 wheeled)       General Gait Details: Pt initially slow and hesitant with ambulation, but after a little warm up she is less reliant on the walker and has a relatively consistent cadence.  She has considerable b/l genu varus and does report b/l knee pain with  walking.  Pt had no LOBs and only mild fatigue with the effort.   Stairs            Wheelchair Mobility    Modified Rankin (Stroke Patients Only)       Balance                                             Pertinent Vitals/Pain Pain Assessment: No/denies pain    Home Living Family/patient expects to be discharged to:: Private residence Living Arrangements: Children Available Help at Discharge: Family Type of Home: House Home Access:  (per pt: 1 small step)     Home Layout: One level Home Equipment: [place] - 2 wheels;Shower seat Additional Comments: Appears she moved in w/o daughter after leg fx    Prior Function Level of Independence: Independent with assistive device(s)         Comments: Pt reports that she is able to get around the home as needed, limited in the community     Hand Dominance        Extremity/Trunk Assessment   Upper Extremity Assessment: Overall WFL for tasks assessed;Generalized weakness           Lower Extremity Assessment: Overall WFL for tasks assessed;Generalized weakness         Communication   Communication: HOH  Cognition Arousal/Alertness: Awake/alert Behavior During Therapy: [place]  for tasks assessed/performed Overall Cognitive Status: Difficult to assess                      General Comments      Exercises     Assessment/Plan    PT Assessment Patient needs continued PT services  PT Problem List Decreased strength;Decreased activity tolerance;Decreased mobility;Decreased balance          PT Treatment Interventions DME instruction;Gait training;Stair training;Therapeutic exercise;Therapeutic activities;Functional mobility training;Neuromuscular re-education;Balance training    PT Goals (Current goals can be found in the Care Plan section)  Acute Rehab PT Goals Patient Stated Goal: To return home  PT Goal Formulation: With patient Time For Goal Achievement: 01/08/16 Potential to  Achieve Goals: Good    Frequency Min 2X/week   Barriers to discharge        Co-evaluation               End of Session Equipment Utilized During Treatment: Gait belt Activity Tolerance: Patient tolerated treatment well Patient left: with chair alarm set;with call bell/phone within reach           Time: 1436-1457 PT Time Calculation (min) (ACUTE ONLY): 21 min   Charges:   PT Evaluation $PT Eval Low Complexity: 1 Procedure     PT G CodesKreg Shropshire, DPT 12/25/2015, 4:10 PM

## 2015-12-25 NOTE — Progress Notes (Signed)
*  PRELIMINARY RESULTS* Echocardiogram 2D Echocardiogram has been performed.  Sherrie Sport 12/25/2015, 4:06 PM

## 2015-12-25 NOTE — Care Management (Addendum)
Patient with discharge from [facility] 11/24 after stay for sepsis related to infected ureteral stone with hydronephrosis.  [facility] for [facility] and [facility]. [facility] was not able to locate patient before she was readmitted. Notified [facility] of admission.  Admitted with congestive heart failure. Echo pending. She is currently living with her daughter in [place].  She does not know the address. Says she is able to perform her own adls.  Has a walker.  Current with PCP and no issues accessing medical care or obtaining meds.

## 2015-12-25 NOTE — Progress Notes (Addendum)
[place] [facility] [place] NAME: Emma [name]    MR#:  [id number]  DATE OF BIRTH:  [birth year]  SUBJECTIVE:   Patient Here with fluid overload with lower extremity edema. Lasix seems to have decreased lower extreme edema.  REVIEW OF SYSTEMS:    Review of Systems  Constitutional: Negative.  Negative for chills, fever and malaise/fatigue.  HENT: Negative.  Negative for ear discharge, ear pain, hearing loss, nosebleeds and sore throat.   Eyes: Negative.  Negative for blurred vision and pain.  Respiratory: Negative.  Negative for cough, hemoptysis, shortness of breath and wheezing.   Cardiovascular: Positive for orthopnea and leg swelling. Negative for chest pain and palpitations.  Gastrointestinal: Negative.  Negative for abdominal pain, blood in stool, diarrhea, nausea and vomiting.  Genitourinary: Negative.  Negative for dysuria.  Musculoskeletal: Negative.  Negative for back pain.  Skin: Negative.   Neurological: Negative for dizziness, tremors, speech change, focal weakness, seizures and headaches.  Endo/Heme/Allergies: Negative.  Does not bruise/bleed easily.  Psychiatric/Behavioral: Negative.  Negative for depression, hallucinations and suicidal ideas.    Tolerating Diet: yes      DRUG ALLERGIES:   Allergies  Allergen Reactions  . Sulfa Antibiotics     Other reaction(s): Kidney Disorder    VITALS:  Blood pressure (!) 146/47, pulse 65, temperature 98.5 F (36.9 C), temperature source Oral, resp. rate 20, height 5\' 5"  (1.651 m), weight 89.9 kg (198 lb 4.8 oz), SpO2 98 %.  PHYSICAL EXAMINATION:   Physical Exam    LABORATORY PANEL:   CBC  Recent Labs Lab 12/24/15 1754  WBC 6.0  HGB 11.4*  HCT 34.9*  PLT 228   ------------------------------------------------------------------------------------------------------------------  Chemistries   Recent Labs Lab 12/24/15 1754 12/25/15 0543  NA 140 139  K 3.7 3.6  CL 104  100*  CO2 27 30  GLUCOSE 111* 134*  BUN 10 9  CREATININE 0.73 0.82  CALCIUM 9.1 9.3  AST 24  --   ALT 15  --   ALKPHOS 89  --   BILITOT 0.2*  --    ------------------------------------------------------------------------------------------------------------------  Cardiac Enzymes  Recent Labs Lab 12/24/15 1754  TROPONINI <0.03   ------------------------------------------------------------------------------------------------------------------  RADIOLOGY:  Dg Chest 1 View  Result Date: 12/24/2015 CLINICAL DATA:  Bilateral leg swelling, chest pain, and shortness of breath. History of left breast cancer, hypertension. Discharged from the hospital on Friday after a cysto procedure. EXAM: CHEST 1 VIEW COMPARISON:  12/20/2015 FINDINGS: The heart is enlarged. There is prominence of interstitial markings consistent with interstitial pulmonary edema. Small bilateral pleural effusions are suspected. No focal consolidations are identified. IMPRESSION: Cardiomegaly and mild interstitial edema. Electronically Signed   By: Nolon Nations M.D.   On: 12/24/2015 18:15     ASSESSMENT AND PLAN:   75 year old female with essential hypertension who is recently discharged for sepsis due to ureteral stone/Escherichia coli bacteremia who presents with lower extremity edema and orthopnea   1. Lower extremity edema: Echocardiogram pending to evaluate for congestive heart failure. Continue diuresis with IV Lasix Monitor BMP Appreciate cardiology consult  2. Hypertensive urgency: Blood pressure improved. Continue labetalol, Cozaar, HCTZ Monitor blood pressure  3. Cardizem: Continue Synthroid  4. Hx of breast cancer on ARIMEDEX  5. Hypokalemia: Improved with repletion  6. Recent hospitalization for sepsis due to ureteral stone and Escherichia coli bacteremia: Continue ciprofloxacin  Management plans discussed with the patient and she is in agreement.  CODE STATUS: full  TOTAL TIME TAKING  CARE  OF THIS PATIENT: 33 minutes.   Physical therapy consult for disposition  POSSIBLE D/C 1-2 days, DEPENDING ON CLINICAL CONDITION.   Ronny Ruddell M.D on 12/25/2015 at 11:22 AM  Between 7am to 6pm - Pager - [phone] After 6pm go to [email or web address] - password [email or web address] [facility]  Office  [phone]  CC: Primary care physician; [name], MD  Note: This dictation was prepared with Dragon dictation along with smaller phrase technology. Any transcriptional errors that result from this process are unintentional.

## 2015-12-26 LAB — BASIC METABOLIC PANEL
Anion gap: 9 (ref 5–15)
BUN: 12 mg/dL (ref 6–20)
CALCIUM: 8.9 mg/dL (ref 8.9–10.3)
CHLORIDE: 101 mmol/L (ref 101–111)
CO2: 29 mmol/L (ref 22–32)
CREATININE: 0.7 mg/dL (ref 0.44–1.00)
GFR calc non Af Amer: >60 mL/min (ref >60)
Glucose, Bld: 131 mg/dL — ABNORMAL HIGH (ref 65–99)
Potassium: 3.5 mmol/L (ref 3.5–5.1)
SODIUM: 139 mmol/L (ref 135–145)

## 2015-12-26 MED ORDER — FUROSEMIDE 40 MG PO TABS: 40.0000 mg | ORAL_TABLET | Freq: Two times a day (BID) | ORAL | 0 refills | Status: DC

## 2015-12-26 MED ORDER — ASPIRIN 81 MG PO TBEC: 81.0000 mg | DELAYED_RELEASE_TABLET | Freq: Every day | ORAL | 2 refills | Status: DC

## 2015-12-26 MED ORDER — FUROSEMIDE 40 MG PO TABS
40.0000 mg | ORAL_TABLET | Freq: Two times a day (BID) | ORAL | Status: DC
  Administered 2015-12-26: 40 mg via ORAL
  Filled 2015-12-26: qty 1

## 2015-12-26 NOTE — Discharge Instructions (Signed)
Heart Failure Clinic appointment on January 16, 2016 at 2:00pm with Darylene Price, [place]. Please call [phone] to reschedule.   Heart healthy diet. HHPT.

## 2015-12-26 NOTE — Progress Notes (Signed)
[facility] Cardiology [facility] Encounter Note  Patient: Emma [name] / Admit Date: 12/24/2015 / Date of Encounter: 12/26/2015, 8:10 AM   Subjective: Patient feels better overnight. No evidence of chest pain shortness of breath or hypoxia. Patient ambulating minimally without further significant symptoms Echocardiogram showing normal LV systolic function with ejection fraction of 55% without evidence of significant valvular heart disease  Review of Systems: Positive for: Shortness of breath Negative for: Vision change, hearing change, syncope, dizziness, nausea, vomiting,diarrhea, bloody stool, stomach pain, cough, congestion, diaphoresis, urinary frequency, urinary pain,skin lesions, skin rashes Others previously listed  Objective: Telemetry: Normal sinus rhythm with heart rate of 60 bpm Physical Exam: Blood pressure (!) 163/62, pulse (!) 59, temperature 98 F (36.7 C), temperature source Oral, resp. rate 18, height 5\' 5"  (1.651 m), weight 90.1 kg (198 lb 11.2 oz), SpO2 94 %. Body mass index is 33.07 kg/m. General: Well developed, well nourished, in no acute distress. Head: Normocephalic, atraumatic, sclera non-icteric, no xanthomas, nares are without discharge. Neck: No apparent masses Lungs: Normal respirations with no wheezes, no rhonchi, no rales , no crackles   Heart: Regular rate and rhythm, normal S1 S2, no murmur, no rub, no gallop, PMI is normal size and placement, carotid upstroke normal without bruit, jugular venous pressure normal Abdomen: Soft, non-tender, non-distended with normoactive bowel sounds. No hepatosplenomegaly. Abdominal aorta is normal size without bruit Extremities: No edema, no clubbing, no cyanosis, no ulcers,  Peripheral: 2+ radial, 2+ femoral, 2+ dorsal pedal pulses Neuro: Alert and oriented. Moves all extremities spontaneously. Psych:  Responds to questions appropriately with a normal affect.   Intake/Output Summary (Last 24 hours) at 12/26/15  0810 Last data filed at 12/26/15 0528  Gross per 24 hour  Intake                0 ml  Output                0 ml  Net                0 ml    Inpatient Medications:  . anastrozole  1 mg Oral Daily  . aspirin EC  81 mg Oral Daily  . cefUROXime  500 mg Oral BID WC  . enoxaparin (LOVENOX) injection  40 mg Subcutaneous Q24H  . furosemide  40 mg Intravenous Q12H  . losartan  100 mg Oral Daily   And  . hydrochlorothiazide  25 mg Oral Daily  . labetalol  300 mg Oral BID  . levothyroxine  88 mcg Oral QAC breakfast  . potassium chloride SA  20 mEq Oral BID  . sodium chloride flush  3 mL Intravenous Q12H  . Vitamin D (Ergocalciferol)  50,000 Units Oral Q7 days   Infusions:   Labs:  Recent Labs  12/25/15 0543 12/26/15 0550  NA 139 139  K 3.6 3.5  CL 100* 101  CO2 30 29  GLUCOSE 134* 131*  BUN 9 12  CREATININE 0.82 0.70  CALCIUM 9.3 8.9    Recent Labs  12/24/15 1754  AST 24  ALT 15  ALKPHOS 89  BILITOT 0.2*  PROT 6.3*  ALBUMIN 3.0*    Recent Labs  12/24/15 1754  WBC 6.0  NEUTROABS 4.3  HGB 11.4*  HCT 34.9*  MCV 85.4  PLT 228    Recent Labs  12/24/15 1754  TROPONINI <0.03   Invalid input(s): POCBNP No results for input(s): HGBA1C in the last 72 hours.   Weights: [employer]  12/24/15 2316 12/25/15 0451 12/26/15 0527  Weight: 89.9 kg (198 lb 3.2 oz) 89.9 kg (198 lb 4.8 oz) 90.1 kg (198 lb 11.2 oz)     Radiology/Studies:  Dg Chest 1 View  Result Date: 12/24/2015 CLINICAL DATA:  Bilateral leg swelling, chest pain, and shortness of breath. History of left breast cancer, hypertension. Discharged from the hospital on Friday after a cysto procedure. EXAM: CHEST 1 VIEW COMPARISON:  12/20/2015 FINDINGS: The heart is enlarged. There is prominence of interstitial markings consistent with interstitial pulmonary edema. Small bilateral pleural effusions are suspected. No focal consolidations are identified. IMPRESSION: Cardiomegaly and mild interstitial edema.  Electronically Signed   By: Nolon Nations M.D.   On: 12/24/2015 18:15   Dg Chest 1 View  Result Date: 12/20/2015 CLINICAL DATA:  Weakness this morning, physician noted crackles on left side of chest this morning. Hx - Cystoscopy - right side with retrograde pyelogram, urteroscopy with stent placement 12/19/2015 HTN, breast cancer 2014, non-smoker. EXAM: CHEST 1 VIEW COMPARISON:  02/22/2014 FINDINGS: Normal cardiac silhouette. Chronic bronchitic markings. No focal infiltrate. No edema. IMPRESSION: Chronic bronchitic markings.  No clear acute findings Electronically Signed   By: Suzy Bouchard M.D.   On: 12/20/2015 11:41   Ct Renal Stone Study  Result Date: 12/19/2015 CLINICAL DATA:  Acute onset of dysuria and generalized abdominal pain. Nausea. Initial encounter. EXAM: CT ABDOMEN AND PELVIS WITHOUT CONTRAST TECHNIQUE: Multidetector CT imaging of the abdomen and pelvis was performed following the standard protocol without IV contrast. COMPARISON:  CT of the abdomen and pelvis performed 01/31/2014 FINDINGS: Lower chest: Trace pleural fluid is noted bilaterally, with mild bibasilar atelectasis. Scattered coronary artery calcifications are seen. The heart is mildly enlarged. Hepatobiliary: The liver is unremarkable in appearance. The patient is status post cholecystectomy, with clips noted at the gallbladder fossa. The common bile duct remains normal in caliber. Pancreas: The pancreas is within normal limits. Spleen: A peripherally calcified splenic cystic lesion is noted at the anterior aspect of the spleen, measuring 3.5 cm. Adrenals/Urinary Tract: The adrenal glands are unremarkable in appearance. There is incomplete rotation of the right kidney. Mild right-sided hydronephrosis is noted, with prominence of the right ureter along its entire course, to the level of an obstructing 4 x 3 mm stone at the distal right ureter, just above the right vesicoureteral junction. A small nonobstructing 2 mm stone is  noted at the posterior aspect of the right kidney. Stomach/Bowel: The stomach is unremarkable in appearance. The small bowel is within normal limits. The appendix is normal in caliber, without evidence of appendicitis. The colon is unremarkable in appearance. Note is again made of a spiculated retractile mass at the central small bowel mesentery, demonstrating focal calcifications, and measuring 7.1 cm in maximal dimension. 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 Vascular/Lymphatic: Scattered calcification is seen along the abdominal aorta and its branches. The abdominal aorta is otherwise grossly unremarkable. The inferior vena cava is grossly unremarkable. No retroperitoneal lymphadenopathy is seen. No pelvic sidewall lymphadenopathy is identified. Reproductive: The bladder is mildly distended. The uterus is grossly unremarkable. A 5.4 cm mass at the right adnexa is stable from 2010 and benign in appearance. 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 Other: No additional soft tissue abnormalities are seen. Musculoskeletal: No acute osseous abnormalities are identified. The visualized musculature is unremarkable in appearance. IMPRESSION: 1. Mild right-sided hydronephrosis, with an  obstructing 4 x 3 mm stone noted at the distal right ureter, just above the right vesicoureteral junction. 2. Spiculated retractile mass at the central small bowel mesentery demonstrates focal calcifications, measuring up to 7.1 cm in maximal dimension. 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 3. 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 4. Stable peripherally calcified cystic lesion at the anterior spleen is likely benign. 5. 2 mm  nonobstructing stone at the posterior aspect of the right kidney. 6. Trace bilateral pleural fluid, with mild bibasilar atelectasis. 7. Scattered coronary artery calcifications.  Mild cardiomegaly. 8. Scattered aortic atherosclerosis. Electronically Signed   By: Garald Balding M.D.   On: 12/19/2015 02:28     Assessment and Recommendation  75 y.o. female with the essential hypertension mixed hyperlipidemia and acute on chronic diastolic dysfunction congestive heart failure now significantly improved on of furosemide as well as appropriate medication management for hypertension control 1. Continue furosemide and changed to oral medication at this time 2. No change in labetalol losartan combination for systolic blood pressure below 130 mm 3. Begin ambulation and follow for further significant symptoms and possible discharged home from cardiac standpoint if no further symptoms occur with follow-up in one to 2 weeks 4. No further cardiac diagnostics necessary at this time  Signed, Serafina Royals M.D. FACC

## 2015-12-26 NOTE — Progress Notes (Signed)
Patient ambulated around the unit using a walker on room air while being continuously monitored on pulse ox.  Her oxygen never dropped below 97% on room air. Patient tolerated ambulation well. Will continue to monitor.

## 2015-12-26 NOTE — Discharge Summary (Signed)
[place] [facility] [place] NAME: Emma [name]    MR#:  [id number]  DATE OF BIRTH:  March 05, 1940  DATE OF ADMISSION:  12/24/2015   ADMITTING PHYSICIAN: Lytle Butte, MD  DATE OF DISCHARGE: 12/26/2015 PRIMARY CARE PHYSICIAN: [name], MD   ADMISSION DIAGNOSIS:  Orthopnea [R06.01] Bilateral lower extremity edema [R60.0] Acute congestive heart failure, unspecified congestive heart failure type ([place]) [I50.9] DISCHARGE DIAGNOSIS:  Active Problems:   Acute on chronic congestive heart failure (HCC) Acute on chronic diastolic congestive heart failure. Hypertensive urgency SECONDARY DIAGNOSIS:   Past Medical History:  Diagnosis Date  . Aromatase inhibitor use   . Breast cancer, left breast ([place]) 06/19/2014  . GERD (gastroesophageal reflux disease)   . Hypertension   . 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 Thyroid disease    HOSPITAL COURSE:  75 year old female with essential hypertension who is recently discharged for sepsis due to ureteral stone/Escherichia coli bacteremia who presents with lower extremity edema and orthopnea   1. Acute on chronic diastolic congestive heart failure. Treated with IV Lasix, change to po bid.  2. Hypertensive urgency: Blood pressure improved. Continue labetalol, Cozaar, HCTZ and lasix.  3. Cardizem: Continue Synthroid  4. Hx of breast cancer on ARIMEDEX  5. Hypokalemia: Improved with repletion  6. Recent hospitalization for sepsis due to ureteral stone (s/p stent) and Escherichia coli bacteremia: Continue ciprofloxacin  DISCHARGE CONDITIONS:  Stable, discharge to home with HHPT today. CONSULTS OBTAINED:  Treatment Team:  Lytle Butte, MD Corey Skains, MD DRUG ALLERGIES:   Allergies  Allergen Reactions  . Sulfa Antibiotics     Other reaction(s): Kidney Disorder   DISCHARGE MEDICATIONS:     Medication List    TAKE these medications   anastrozole 1 MG tablet Commonly known as:   ARIMIDEX Take 1 tablet (1 mg total) by mouth daily.   aspirin 81 MG EC tablet Take 1 tablet (81 mg total) by mouth daily. Start taking on:  12/27/2015   cefUROXime 500 MG tablet Commonly known as:  CEFTIN Take 1 tablet by mouth 2 (two) times daily.   ciprofloxacin 500 MG tablet Commonly known as:  CIPRO Take 1 tablet (500 mg total) by mouth 2 (two) times daily.   ergocalciferol 50000 units capsule Commonly known as:  VITAMIN D2 Take 1 capsule (50,000 Units total) by mouth once a week.   furosemide 40 MG tablet Commonly known as:  LASIX Take 1 tablet (40 mg total) by mouth 2 (two) times daily.   HYDROcodone-acetaminophen 5-325 MG tablet Commonly known as:  NORCO Take 1 tablet by mouth every 6 (six) hours as needed for moderate pain.   labetalol 300 MG tablet Commonly known as:  NORMODYNE TAKE 1 TABLET (300 MG TOTAL) BY MOUTH 2 (TWO) TIMES DAILY.   losartan-hydrochlorothiazide 100-25 MG tablet Commonly known as:  HYZAAR Take 1 tablet by mouth daily.   octreotide 30 MG injection Commonly known as:  SANDOSTATIN LAR Inject 30 mg into the muscle every 28 (twenty-eight) days.   potassium chloride SA 20 MEQ tablet Commonly known as:  K-DUR,KLOR-CON Take 1 tablet (20 mEq total) by mouth 2 (two) times daily. X 2 weeks   SYNTHROID 88 MCG tablet Generic drug:  levothyroxine Take 88 mcg by mouth daily before breakfast.        DISCHARGE INSTRUCTIONS:  See AVS.  If you experience worsening of your admission symptoms, develop shortness of breath, life threatening emergency, suicidal or homicidal thoughts you must  seek medical attention immediately by calling 911 or calling your MD immediately  if symptoms less severe.  You Must read complete instructions/literature along with all the possible adverse reactions/side effects for all the Medicines you take and that have been prescribed to you. Take any new Medicines after you have completely understood and accpet all the possible  adverse reactions/side effects.   Please note  You were cared for by a hospitalist during your hospital stay. If you have any questions about your discharge medications or the care you received while you were in the hospital after you are discharged, you can call the unit and asked to speak with the hospitalist on call if the hospitalist that took care of you is not available. Once you are discharged, your primary care physician will handle any further medical issues. Please note that NO REFILLS for any discharge medications will be authorized once you are discharged, as it is imperative that you return to your primary care physician (or establish a relationship with a primary care physician if you do not have one) for your aftercare needs so that they can reassess your need for medications and monitor your lab values.    On the day of Discharge:  VITAL SIGNS:  Blood pressure (!) 131/45, pulse 65, temperature 97.7 F (36.5 C), temperature source Oral, resp. rate 18, height 5\' 5"  (1.651 m), weight 198 lb 11.2 oz (90.1 kg), SpO2 96 %. PHYSICAL EXAMINATION:  GENERAL:  75 y.o.-year-old patient lying in the bed with no acute distress.  EYES: Pupils equal, round, reactive to light and accommodation. No scleral icterus. Extraocular muscles intact.  HEENT: Head atraumatic, normocephalic. Oropharynx and nasopharynx clear.  NECK:  Supple, no jugular venous distention. No thyroid enlargement, no tenderness.  LUNGS: Normal breath sounds bilaterally, no wheezing, rales,rhonchi or crepitation. No use of accessory muscles of respiration.  CARDIOVASCULAR: S1, S2 normal. No murmurs, rubs, or gallops.  ABDOMEN: Soft, non-tender, non-distended. Bowel sounds present. No organomegaly or mass.  EXTREMITIES: bilateral leg edema, no cyanosis, or clubbing.  NEUROLOGIC: Cranial nerves II through XII are intact. Muscle strength 5/5 in all extremities. Sensation intact. Gait not checked.  PSYCHIATRIC: The patient is alert  and oriented x 3.  SKIN: No obvious rash, lesion, or ulcer.  DATA REVIEW:   CBC  Recent Labs Lab 12/24/15 1754  WBC 6.0  HGB 11.4*  HCT 34.9*  PLT 228    Chemistries   Recent Labs Lab 12/24/15 1754  12/26/15 0550  NA 140  < > 139  K 3.7  < > 3.5  CL 104  < > 101  CO2 27  < > 29  GLUCOSE 111*  < > 131*  BUN 10  < > 12  CREATININE 0.73  < > 0.70  CALCIUM 9.1  < > 8.9  AST 24  --   --   ALT 15  --   --   ALKPHOS 89  --   --   BILITOT 0.2*  --   --   < > = values in this interval not displayed.   Microbiology Results  Results for orders placed or performed during the hospital encounter of 12/18/15  Blood Culture (routine x 2)     Status: Abnormal   Collection Time: 12/18/15 11:35 PM  Result Value Ref Range Status   Specimen Description BLOOD  R FA  Final   Special Requests   Final    BOTTLES DRAWN AEROBIC AND ANAEROBIC  AER 5 ML ANA  4 ML   Culture  Setup Time   Final    AEROBIC BOTTLE ONLY GRAM NEGATIVE RODS CRITICAL RESULT CALLED TO, READ BACK BY AND VERIFIED WITH: JASON ROBBINS 12/19/15 1740 KLW    Culture (A)  Final    ESCHERICHIA COLI SUSCEPTIBILITIES PERFORMED ON PREVIOUS CULTURE WITHIN THE LAST 5 DAYS. Performed at [facility]    Report Status 12/22/2015 FINAL  Final  Blood Culture (routine x 2)     Status: Abnormal   Collection Time: 12/18/15 11:35 PM  Result Value Ref Range Status   Specimen Description BLOOD  R AC  Final   Special Requests   Final    BOTTLES DRAWN AEROBIC AND ANAEROBIC  AER 4 ML ANA 4 ML   Culture  Setup Time   Final    GRAM NEGATIVE RODS AEROBIC BOTTLE ONLY CRITICAL RESULT CALLED TO, READ BACK BY AND VERIFIED WITH: JASON ROBBINS 12/19/15 1740 KLW Performed at [place] (A)  Final   Report Status 12/22/2015 FINAL  Final   Organism ID, Bacteria ESCHERICHIA COLI  Final      Susceptibility   Escherichia coli - MIC*    AMPICILLIN <=2 SENSITIVE Sensitive     CEFAZOLIN <=4  SENSITIVE Sensitive     CEFEPIME <=1 SENSITIVE Sensitive     CEFTAZIDIME <=1 SENSITIVE Sensitive     CEFTRIAXONE <=1 SENSITIVE Sensitive     CIPROFLOXACIN <=0.25 SENSITIVE Sensitive     GENTAMICIN <=1 SENSITIVE Sensitive     IMIPENEM <=0.25 SENSITIVE Sensitive     TRIMETH/SULFA <=20 SENSITIVE Sensitive     AMPICILLIN/SULBACTAM <=2 SENSITIVE Sensitive     PIP/TAZO <=4 SENSITIVE Sensitive     Extended ESBL NEGATIVE Sensitive     * ESCHERICHIA COLI  Urine culture     Status: Abnormal   Collection Time: 12/18/15 11:35 PM  Result Value Ref Range Status   Specimen Description URINE, RANDOM  Final   Special Requests NONE  Final   Culture >=100,000 COLONIES/mL ESCHERICHIA COLI (A)  Final   Report Status 12/21/2015 FINAL  Final   Organism ID, Bacteria ESCHERICHIA COLI (A)  Final      Susceptibility   Escherichia coli - MIC*    AMPICILLIN 4 SENSITIVE Sensitive     CEFAZOLIN <=4 SENSITIVE Sensitive     CEFTRIAXONE <=1 SENSITIVE Sensitive     CIPROFLOXACIN <=0.25 SENSITIVE Sensitive     GENTAMICIN <=1 SENSITIVE Sensitive     IMIPENEM <=0.25 SENSITIVE Sensitive     NITROFURANTOIN <=16 SENSITIVE Sensitive     TRIMETH/SULFA <=20 SENSITIVE Sensitive     AMPICILLIN/SULBACTAM <=2 SENSITIVE Sensitive     PIP/TAZO <=4 SENSITIVE Sensitive     Extended ESBL NEGATIVE Sensitive     * >=100,000 COLONIES/mL ESCHERICHIA COLI  Blood Culture ID Panel (Reflexed)     Status: Abnormal   Collection Time: 12/18/15 11:35 PM  Result Value Ref Range Status   Enterococcus species NOT DETECTED NOT DETECTED Final   Listeria monocytogenes NOT DETECTED NOT DETECTED Final   Staphylococcus species NOT DETECTED NOT DETECTED Final   Staphylococcus aureus NOT DETECTED NOT DETECTED Final   Streptococcus species NOT DETECTED NOT DETECTED Final   Streptococcus agalactiae NOT DETECTED NOT DETECTED Final   Streptococcus pneumoniae NOT DETECTED NOT DETECTED Final   Streptococcus pyogenes NOT DETECTED NOT DETECTED Final    Acinetobacter baumannii NOT DETECTED NOT DETECTED Final   Enterobacteriaceae species DETECTED (A) NOT DETECTED Final    Comment:  CRITICAL RESULT CALLED TO, READ BACK BY AND VERIFIED WITH: JASON ROBBINS 12/19/15 1740 KLW    Enterobacter cloacae complex NOT DETECTED NOT DETECTED Final   Escherichia coli DETECTED (A) NOT DETECTED Final    Comment: CRITICAL RESULT CALLED TO, READ BACK BY AND VERIFIED WITH: JASON ROBBINS 12/19/15 1740 KLW    Klebsiella oxytoca NOT DETECTED NOT DETECTED Final   Klebsiella pneumoniae NOT DETECTED NOT DETECTED Final   Proteus species NOT DETECTED NOT DETECTED Final   Serratia marcescens NOT DETECTED NOT DETECTED Final   Carbapenem resistance NOT DETECTED NOT DETECTED Final   Haemophilus influenzae NOT DETECTED NOT DETECTED Final   Neisseria meningitidis NOT DETECTED NOT DETECTED Final   Pseudomonas aeruginosa NOT DETECTED NOT DETECTED Final   Candida albicans NOT DETECTED NOT DETECTED Final   Candida glabrata NOT DETECTED NOT DETECTED Final   Candida krusei NOT DETECTED NOT DETECTED Final   Candida parapsilosis NOT DETECTED NOT DETECTED Final   Candida tropicalis NOT DETECTED NOT DETECTED Final    RADIOLOGY:  No results found.   Management plans discussed with the patient, her daugher and they are in agreement.  CODE STATUS:     Code Status Orders        Start     Ordered   12/24/15 2020  Full code  Continuous     12/24/15 2020    Code Status History    Date Active Date Inactive Code Status Order ID Comments User Context   12/19/2015  4:10 AM 12/21/2015  6:43 PM Full Code [id number]  Harrie Foreman, MD Inpatient   08/26/2015  1:23 AM 08/27/2015  8:31 PM Full Code [id number]  Lance Coon, MD ED      TOTAL TIME TAKING CARE OF THIS PATIENT: 35 minutes.    Demetrios Loll M.D on 12/26/2015 at 3:13 PM  Between 7am to 6pm - Pager - [phone]  After 6pm go to [email or web address] - Proofreader  [facility]  Office   [phone]  CC: Primary care physician; [name], MD   Note: This dictation was prepared with Dragon dictation along with smaller phrase technology. Any transcriptional errors that result from this process are unintentional.

## 2015-12-26 NOTE — Progress Notes (Signed)
Patient discharged per MD order and hospital protocol. Patient and daughter verbalized understanding of medications, follow up appointments and discharge instructions. Patient was given handout on heart failure. Patient's home medications were returned and counted with patient family. Patient escorted off the unit via wheelchair.

## 2015-12-26 NOTE — Progress Notes (Signed)
Initial Heart Failure Clinic appointment scheduled on January 16, 2016 at 2:00pm. Thank you for the referral.

## 2015-12-27 ENCOUNTER — Ambulatory Visit: Payer: Medicare Other | Admitting: Urology

## 2015-12-27 NOTE — Anesthesia Postprocedure Evaluation (Signed)
Anesthesia Post Note  Patient: AYO GUARINO  Procedure(s) Performed: Procedure(s) (LRB): CYSTOSCOPY WITH RETROGRADE PYELOGRAM, URETEROSCOPY AND STENT PLACEMENT (Right)  Patient location during evaluation: PACU Anesthesia Type: General Level of consciousness: awake and alert Pain management: pain level controlled Vital Signs Assessment: post-procedure vital signs reviewed and stable Respiratory status: spontaneous breathing, nonlabored ventilation, respiratory function stable and patient connected to nasal cannula oxygen Cardiovascular status: blood pressure returned to baseline and stable Postop Assessment: no signs of nausea or vomiting Anesthetic complications: no    Last Vitals:  Vitals:   12/21/15 0807 12/21/15 1353  BP: (!) 161/60 (!) 162/60  Pulse:  62  Resp:  18  Temp:  36.7 C    Last Pain:  Vitals:   12/21/15 1500  TempSrc:   PainSc: [place] Col[place] Dirosa

## 2015-12-31 ENCOUNTER — Ambulatory Visit (INDEPENDENT_AMBULATORY_CARE_PROVIDER_SITE_OTHER): Payer: Medicare Other | Admitting: Urology

## 2015-12-31 ENCOUNTER — Other Ambulatory Visit: Payer: Self-pay | Admitting: Radiology

## 2015-12-31 ENCOUNTER — Encounter: Payer: Self-pay | Admitting: Urology

## 2015-12-31 ENCOUNTER — Ambulatory Visit: Payer: Self-pay

## 2015-12-31 ENCOUNTER — Telehealth: Payer: Self-pay | Admitting: Radiology

## 2015-12-31 VITALS — BP 125/70 | HR 58 | Wt 191.4 lb

## 2015-12-31 DIAGNOSIS — N201 Calculus of ureter: Secondary | ICD-10-CM

## 2015-12-31 NOTE — Telephone Encounter (Signed)
Notified pt's daughter, [name], of surgery scheduled with Dr Erlene Quan on 01/07/16, pre-admit testing appt on 01/02/16 @11 :15 & to call Friday prior to surgery for arrival time to SDS. [name] voices understanding.

## 2015-12-31 NOTE — Progress Notes (Signed)
12/31/2015 4:58 PM   Emma [name] [birth year] [id number]  Referring provider: [name], MD [address] [facility] [place], [place] [address]  Chief Complaint  Patient presents with  . New Patient (Initial Visit)    Renal stone    HPI: 75 year old female who presents today for follow-up after undergoing a right ureteral stent for an infected right obstructing stone. The patient was admitted to the hospital following the procedure and found to be bacteremic with Escherichia coli both in her urine and her blood. She was discharged home with cefuroxime which she has been taking and tolerating well. The patient was then readmitted with an acute CHF exacerbation several days after discharge. She has now been out of the hospital for a few days and is feeling quite well.She denies any progressive voiding symptoms. She denies any burning. She denies any worsening flank pain.   PMH: Past Medical History:  Diagnosis Date  . Aromatase inhibitor use   . Breast cancer, left breast ([place]) 06/19/2014  . GERD (gastroesophageal reflux disease)   . Hypertension   . 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 Thyroid disease     Surgical History: Past Surgical History:  Procedure Laterality Date  . ABDOMINAL HYSTERECTOMY     partial  . BREAST EXCISIONAL BIOPSY Left 12/2012   +  . BREAST LUMPECTOMY WITH SENTINEL LYMPH NODE BIOPSY Left 2014  . CHOLECYSTECTOMY    . CYSTOSCOPY WITH RETROGRADE PYELOGRAM, URETEROSCOPY AND STENT PLACEMENT Right 12/19/2015   Procedure: CYSTOSCOPY WITH RETROGRADE PYELOGRAM, URETEROSCOPY AND STENT PLACEMENT;  Surgeon: Ardis Hughs, MD;  Location: [facility] ORS;  Service: Urology;  Laterality: Right;    Home Medications:    Medication List       Accurate as of 12/31/15  4:58 PM. Always use your most recent med list.          anastrozole 1 MG tablet Commonly known as:  ARIMIDEX Take 1 tablet (1 mg total) by mouth daily.   aspirin 81 MG EC tablet Take 1  tablet (81 mg total) by mouth daily.   cefUROXime 500 MG tablet Commonly known as:  CEFTIN Take 1 tablet by mouth 2 (two) times daily.   ciprofloxacin 500 MG tablet Commonly known as:  CIPRO Take 1 tablet (500 mg total) by mouth 2 (two) times daily.   ergocalciferol 50000 units capsule Commonly known as:  VITAMIN D2 Take 1 capsule (50,000 Units total) by mouth once a week.   furosemide 40 MG tablet Commonly known as:  LASIX Take 1 tablet (40 mg total) by mouth 2 (two) times daily.   HYDROcodone-acetaminophen 5-325 MG tablet Commonly known as:  NORCO Take 1 tablet by mouth every 6 (six) hours as needed for moderate pain.   labetalol 300 MG tablet Commonly known as:  NORMODYNE TAKE 1 TABLET (300 MG TOTAL) BY MOUTH 2 (TWO) TIMES DAILY.   losartan-hydrochlorothiazide 100-25 MG tablet Commonly known as:  HYZAAR Take 1 tablet by mouth daily.   octreotide 30 MG injection Commonly known as:  SANDOSTATIN LAR Inject 30 mg into the muscle every 28 (twenty-eight) days.   potassium chloride SA 20 MEQ tablet Commonly known as:  K-DUR,KLOR-CON Take 1 tablet (20 mEq total) by mouth 2 (two) times daily. X 2 weeks   SYNTHROID 88 MCG tablet Generic drug:  levothyroxine Take 88 mcg by mouth daily before breakfast.       Allergies:  Allergies  Allergen Reactions  . Sulfa Antibiotics     Other  reaction(s): Kidney Disorder Other reaction(s): Kidney Disorder Other reaction(s): Kidney Disorder    Family History: Family History  Problem Relation Age of Onset  . Breast cancer Mother 48  . Breast cancer Maternal Aunt 68  . Prostate cancer Father     Social History:  reports that she has never smoked. She has never used smokeless tobacco. She reports that she does not drink alcohol or use drugs.  ROS: UROLOGY Frequent Urination?: Yes Hard to postpone urination?: No Burning/pain with urination?: No Get up at night to urinate?: Yes Leakage of urine?: No Urine stream starts and  stops?: No Trouble starting stream?: No Do you have to strain to urinate?: No Blood in urine?: No Urinary tract infection?: Yes Sexually transmitted disease?: No Injury to kidneys or bladder?: No Painful intercourse?: No Weak stream?: No Currently pregnant?: No Vaginal bleeding?: No Last menstrual period?: n  Gastrointestinal Nausea?: Yes Vomiting?: No Indigestion/heartburn?: No Diarrhea?: Yes Constipation?: No  Constitutional Fever: Yes Night sweats?: No Weight loss?: No Fatigue?: Yes  Skin Skin rash/lesions?: No Itching?: No  Eyes Blurred vision?: Yes Double vision?: No  Ears/Nose/Throat Sore throat?: No Sinus problems?: Yes  Hematologic/Lymphatic Swollen glands?: No Easy bruising?: No  Cardiovascular Chest pain?: Yes  Respiratory Cough?: No Shortness of breath?: Yes  Endocrine Excessive thirst?: Yes  Musculoskeletal Back pain?: Yes Joint pain?: Yes  Neurological Headaches?: No Dizziness?: Yes  Psychologic Depression?: No Anxiety?: No  Physical Exam: BP 125/70   Pulse (!) 58   Wt 86.8 kg (191 lb 6.4 oz)   BMI 31.85 kg/m   Constitutional:  Alert and oriented, No acute distress. HEENT: [place] AT, moist mucus membranes.  Trachea midline, no masses. Cardiovascular: No clubbing, cyanosis, or edema. Respiratory: Normal respiratory effort, no increased work of breathing. GI: Abdomen is soft, nontender, nondistended, no abdominal masses GU: No CVA tenderness.  Skin: No rashes, bruises or suspicious lesions. Lymph: No cervical or inguinal adenopathy. Neurologic: Grossly intact, no focal deficits, moving all 4 extremities. Psychiatric: Normal mood and affect.  Laboratory Data: Lab Results  Component Value Date   WBC 6.0 12/24/2015   HGB 11.4 (L) 12/24/2015   HCT 34.9 (L) 12/24/2015   MCV 85.4 12/24/2015   PLT 228 12/24/2015    Lab Results  Component Value Date   CREATININE 0.70 12/26/2015    No results found for: PSA  No results  found for: TESTOSTERONE  Lab Results  Component Value Date   HGBA1C 6.1 (H) 12/19/2015    Urinalysis    Component Value Date/Time   COLORURINE YELLOW (A) 12/24/2015 1754   APPEARANCEUR CLEAR (A) 12/24/2015 1754   APPEARANCEUR Clear 07/12/2013 0941   LABSPEC 1.011 12/24/2015 1754   LABSPEC 1.015 07/12/2013 0941   PHURINE 6.0 12/24/2015 1754   GLUCOSEU NEGATIVE 12/24/2015 1754   GLUCOSEU Negative 07/12/2013 0941   HGBUR 3+ (A) 12/24/2015 1754   BILIRUBINUR NEGATIVE 12/24/2015 1754   BILIRUBINUR Negative 07/12/2013 0941   KETONESUR NEGATIVE 12/24/2015 1754   PROTEINUR NEGATIVE 12/24/2015 1754   NITRITE NEGATIVE 12/24/2015 1754   LEUKOCYTESUR 2+ (A) 12/24/2015 1754   LEUKOCYTESUR Negative 07/12/2013 0941    Pertinent Imaging: I reviewed the patient's CT scan that was obtained in the hospital. This demonstrates a 5 mm right mid ureteral stone that is obstructing with proximal hydronephrosis. The Hounsfield units appear to be roughly 300 on the CT scan. It is see the stone on the CT scout film.  Assessment & Plan:  The patient is recovering well from a  septic right mid ureteral stone. She is being treated for Escherichia coli bacteremia and urinary tract infection. The stone is not well visualized on the CT scan scout and appears to be of ear acid density. As such, I think the best option for this patient would be ureteroscopy and stent exchange.  1. Ureteral stone I spoke with the patient and her son about the different treatment options. Collectively we have agreed to proceed with ureteroscopy, laser lithotripsy and stone removal, stent exchange. The patient should be seen and cleared by her primary care doctor prior to the procedure. - Urine culture (clean catch) - Urinalysis, Complete - CULTURE, URINE COMPREHENSIVE  Cc: Dr. [name], M.D.  No Follow-up on file.  Ardis Hughs, [facility] [address] [place],   [address] [phone]

## 2016-01-01 ENCOUNTER — Telehealth: Payer: Self-pay | Admitting: *Deleted

## 2016-01-01 LAB — MICROSCOPIC EXAMINATION

## 2016-01-01 LAB — URINALYSIS, COMPLETE
Bilirubin, UA: NEGATIVE
Glucose, UA: NEGATIVE
Ketones, UA: NEGATIVE
Nitrite, UA: NEGATIVE
PH UA: 5 (ref 5.0–7.5)
PROTEIN UA: NEGATIVE
Specific Gravity, UA: 1.01 (ref 1.005–1.030)
UUROB: 0.2 mg/dL (ref 0.2–1.0)

## 2016-01-01 NOTE — Telephone Encounter (Signed)
Patient's Sandostatin is on 12/11 and her lithotripsy appeals to be schedule tomorrow.

## 2016-01-01 NOTE — Telephone Encounter (Signed)
Ey scheduled her for surgery on Monday, the same day she has her appt for lab/md/sandostatin. Asked if that would be a problem to schedule it for that afternoon when her appt with [place] in in the morning. I told her that should be fine.

## 2016-01-02 ENCOUNTER — Inpatient Hospital Stay: Admission: RE | Admit: 2016-01-02 | Payer: Medicare Other | Source: Ambulatory Visit

## 2016-01-03 ENCOUNTER — Encounter
Admission: RE | Admit: 2016-01-03 | Discharge: 2016-01-03 | Disposition: A | Discharge status: Home / Self Care | Payer: Medicare Other | Source: Ambulatory Visit | Attending: Urology | Admitting: Urology

## 2016-01-03 DIAGNOSIS — N201 Calculus of ureter: Secondary | ICD-10-CM | POA: Insufficient documentation

## 2016-01-03 DIAGNOSIS — Z01818 Encounter for other preprocedural examination: Secondary | ICD-10-CM | POA: Insufficient documentation

## 2016-01-03 HISTORY — DX: Dizziness and giddiness: R42

## 2016-01-03 HISTORY — DX: Heart failure, unspecified: I50.9

## 2016-01-03 HISTORY — DX: Chronic kidney disease, unspecified: N18.9

## 2016-01-03 HISTORY — DX: Personal history of urinary calculi: Z87.442

## 2016-01-03 HISTORY — DX: Acute embolism and thrombosis of unspecified deep veins of unspecified lower extremity: I82.409

## 2016-01-03 HISTORY — DX: Unspecified hearing loss, unspecified ear: H91.90

## 2016-01-03 HISTORY — DX: Pain, unspecified: R52

## 2016-01-03 HISTORY — DX: Hypothyroidism, unspecified: E03.9

## 2016-01-03 HISTORY — DX: Dyspnea, unspecified: R06.00

## 2016-01-03 HISTORY — DX: Personal history of other diseases of the digestive system: Z87.19

## 2016-01-03 NOTE — Patient Instructions (Signed)
  Your procedure is scheduled on: 01/07/16 Report to Day Surgery.medical mall second floorTo find out your arrival time please call [phone] between 1PM - 3PM on 01/04/16  Remember: Instructions that are not followed completely may result in serious medical risk, up to and including death, or upon the discretion of your surgeon and anesthesiologist your surgery may need to be rescheduled.    _x___ 1. Do not eat food or drink liquids after midnight. No gum chewing or hard candies.     ____ 2. No Alcohol for 24 hours before or after surgery.   ____ 3. Do Not Smoke For 24 Hours Prior to Your Surgery.   ____ 4. Bring all medications with you on the day of surgery if instructed.    x____ 5. Notify your doctor if there is any change in your medical condition     (cold, fever, infections).       Do not wear jewelry, make-up, hairpins, clips or nail polish.  Do not wear lotions, powders, or perfumes. You may wear deodorant.  Do not shave 48 hours prior to surgery. Men may shave face and neck.  Do not bring valuables to the hospital.    [facility] is not responsible for any belongings or valuables.               Contacts, dentures or bridgework may not be worn into surgery.  Leave your suitcase in the car. After surgery it may be brought to your room.  For patients admitted to the hospital, discharge time is determined by your                treatment team.   Patients discharged the day of surgery will not be allowed to drive home.   x___ Take these medicines the morning of surgery with A SIP OF WATER:    1. labetalol  2. levothyroxine  3.   4.  5.  6.  ____ Fleet Enema (as directed)   ____ Use CHG Soap as directed  ____ Use inhalers on the day of surgery  ____ Stop metformin 2 days prior to surgery    ____ Take 1/2 of usual insulin dose the night before surgery and none on the morning of surgery.   ____ Stop Coumadin/Plavix/aspirin on   ____ Stop Anti-inflammatories  on   ____ Stop supplements until after surgery.    ____ Bring C-Pap to the hospital.

## 2016-01-06 MED ORDER — CIPROFLOXACIN IN D5W 400 MG/200ML IV SOLN
400.0000 mg | INTRAVENOUS | Status: AC
  Administered 2016-01-07: 400 mg via INTRAVENOUS

## 2016-01-07 ENCOUNTER — Inpatient Hospital Stay: Payer: Medicare Other | Admitting: Internal Medicine

## 2016-01-07 ENCOUNTER — Ambulatory Visit: Payer: Medicare Other | Admitting: Anesthesiology

## 2016-01-07 ENCOUNTER — Encounter
Admission: RE | Disposition: A | Discharge status: Home / Self Care | Payer: Self-pay | Source: Ambulatory Visit | Attending: Urology

## 2016-01-07 ENCOUNTER — Ambulatory Visit
Admission: RE | Admit: 2016-01-07 | Discharge: 2016-01-07 | Disposition: A | Discharge status: Home / Self Care | Payer: Medicare Other | Source: Ambulatory Visit | Attending: Urology | Admitting: Urology

## 2016-01-07 ENCOUNTER — Inpatient Hospital Stay: Payer: Medicare Other

## 2016-01-07 ENCOUNTER — Encounter: Payer: Self-pay | Admitting: *Deleted

## 2016-01-07 DIAGNOSIS — Z803 Family history of malignant neoplasm of breast: Secondary | ICD-10-CM | POA: Insufficient documentation

## 2016-01-07 DIAGNOSIS — D649 Anemia, unspecified: Secondary | ICD-10-CM | POA: Insufficient documentation

## 2016-01-07 DIAGNOSIS — K219 Gastro-esophageal reflux disease without esophagitis: Secondary | ICD-10-CM | POA: Insufficient documentation

## 2016-01-07 DIAGNOSIS — E039 Hypothyroidism, unspecified: Secondary | ICD-10-CM | POA: Diagnosis not present

## 2016-01-07 DIAGNOSIS — Z7982 Long term (current) use of aspirin: Secondary | ICD-10-CM | POA: Insufficient documentation

## 2016-01-07 DIAGNOSIS — I739 Peripheral vascular disease, unspecified: Secondary | ICD-10-CM | POA: Diagnosis not present

## 2016-01-07 DIAGNOSIS — N201 Calculus of ureter: Secondary | ICD-10-CM | POA: Diagnosis not present

## 2016-01-07 DIAGNOSIS — Z90711 Acquired absence of uterus with remaining cervical stump: Secondary | ICD-10-CM | POA: Insufficient documentation

## 2016-01-07 DIAGNOSIS — I110 Hypertensive heart disease with heart failure: Secondary | ICD-10-CM | POA: Insufficient documentation

## 2016-01-07 DIAGNOSIS — N132 Hydronephrosis with renal and ureteral calculous obstruction: Secondary | ICD-10-CM | POA: Diagnosis present

## 2016-01-07 DIAGNOSIS — Z882 Allergy status to sulfonamides status: Secondary | ICD-10-CM | POA: Insufficient documentation

## 2016-01-07 DIAGNOSIS — Z8042 Family history of malignant neoplasm of prostate: Secondary | ICD-10-CM | POA: Diagnosis not present

## 2016-01-07 DIAGNOSIS — K449 Diaphragmatic hernia without obstruction or gangrene: Secondary | ICD-10-CM | POA: Insufficient documentation

## 2016-01-07 DIAGNOSIS — Z86718 Personal history of other venous thrombosis and embolism: Secondary | ICD-10-CM | POA: Insufficient documentation

## 2016-01-07 DIAGNOSIS — Z853 Personal history of malignant neoplasm of breast: Secondary | ICD-10-CM | POA: Insufficient documentation

## 2016-01-07 DIAGNOSIS — I509 Heart failure, unspecified: Secondary | ICD-10-CM | POA: Insufficient documentation

## 2016-01-07 HISTORY — PX: CYSTOSCOPY W/ URETERAL STENT PLACEMENT: SHX1429

## 2016-01-07 HISTORY — PX: URETEROSCOPY WITH HOLMIUM LASER LITHOTRIPSY: SHX6645

## 2016-01-07 LAB — CULTURE, URINE COMPREHENSIVE

## 2016-01-07 SURGERY — URETEROSCOPY, WITH LITHOTRIPSY USING HOLMIUM LASER
Anesthesia: General | Site: Ureter | Laterality: Right | Wound class: Clean Contaminated

## 2016-01-07 MED ORDER — PROPOFOL 10 MG/ML IV BOLUS
INTRAVENOUS | Status: DC | PRN
  Administered 2016-01-07: 50 mg via INTRAVENOUS
  Administered 2016-01-07: 150 mg via INTRAVENOUS

## 2016-01-07 MED ORDER — CIPROFLOXACIN IN D5W 400 MG/200ML IV SOLN
INTRAVENOUS | Status: AC
  Administered 2016-01-07: 400 mg via INTRAVENOUS
  Filled 2016-01-07: qty 200

## 2016-01-07 MED ORDER — FENTANYL CITRATE (PF) 100 MCG/2ML IJ SOLN
INTRAMUSCULAR | Status: DC | PRN
  Administered 2016-01-07: 50 ug via INTRAVENOUS

## 2016-01-07 MED ORDER — ONDANSETRON HCL 4 MG/2ML IJ SOLN
INTRAMUSCULAR | Status: DC | PRN
  Administered 2016-01-07: 4 mg via INTRAVENOUS

## 2016-01-07 MED ORDER — IOTHALAMATE MEGLUMINE 43 % IV SOLN
INTRAVENOUS | Status: DC | PRN
  Administered 2016-01-07: 14 mL

## 2016-01-07 MED ORDER — MIDAZOLAM HCL 2 MG/2ML IJ SOLN
INTRAMUSCULAR | Status: DC | PRN
  Administered 2016-01-07: 2 mg via INTRAVENOUS

## 2016-01-07 MED ORDER — LIDOCAINE HCL (CARDIAC) 20 MG/ML IV SOLN
INTRAVENOUS | Status: DC | PRN
  Administered 2016-01-07: 100 mg via INTRAVENOUS

## 2016-01-07 MED ORDER — FAMOTIDINE 20 MG PO TABS
20.0000 mg | ORAL_TABLET | Freq: Once | ORAL | Status: AC
  Administered 2016-01-07: 20 mg via ORAL

## 2016-01-07 MED ORDER — DEXAMETHASONE SODIUM PHOSPHATE 10 MG/ML IJ SOLN
INTRAMUSCULAR | Status: DC | PRN
  Administered 2016-01-07: 4 mg via INTRAVENOUS

## 2016-01-07 MED ORDER — OXYBUTYNIN CHLORIDE 5 MG PO TABS: 5.0000 mg | ORAL_TABLET | Freq: Three times a day (TID) | ORAL | 0 refills | Status: DC | PRN

## 2016-01-07 MED ORDER — FAMOTIDINE 20 MG PO TABS
ORAL_TABLET | ORAL | Status: AC
  Administered 2016-01-07: 20 mg via ORAL
  Filled 2016-01-07: qty 1

## 2016-01-07 MED ORDER — ONDANSETRON HCL 4 MG/2ML IJ SOLN: 4.0000 mg | Freq: Once | INTRAMUSCULAR | Status: DC | PRN

## 2016-01-07 MED ORDER — LACTATED RINGERS IV SOLN
INTRAVENOUS | Status: DC
  Administered 2016-01-07: 15:00:00 via INTRAVENOUS

## 2016-01-07 MED ORDER — GLYCOPYRROLATE 0.2 MG/ML IJ SOLN
INTRAMUSCULAR | Status: DC | PRN
  Administered 2016-01-07: 0.2 mg via INTRAVENOUS

## 2016-01-07 MED ORDER — HYDROCODONE-ACETAMINOPHEN 5-325 MG PO TABS: 1.0000 | ORAL_TABLET | Freq: Four times a day (QID) | ORAL | 0 refills | Status: DC | PRN

## 2016-01-07 MED ORDER — FENTANYL CITRATE (PF) 100 MCG/2ML IJ SOLN: 25.0000 ug | INTRAMUSCULAR | Status: DC | PRN

## 2016-01-07 MED ORDER — DOCUSATE SODIUM 100 MG PO CAPS: 100.0000 mg | ORAL_CAPSULE | Freq: Two times a day (BID) | ORAL | 0 refills | Status: DC

## 2016-01-07 SURGICAL SUPPLY — 33 items
BAG DRAIN CYSTO-URO LG1000N (MISCELLANEOUS) ×3 IMPLANT
BASKET ZERO TIP 1.9FR (BASKET) ×3 IMPLANT
CATH URETL 5X70 OPEN END (CATHETERS) ×3 IMPLANT
CNTNR SPEC 2.5X3XGRAD LEK (MISCELLANEOUS) ×2
CONRAY 43 FOR UROLOGY 50M (MISCELLANEOUS) ×3 IMPLANT
CONT SPEC 4OZ STER OR WHT (MISCELLANEOUS) ×1
CONTAINER SPEC 2.5X3XGRAD LEK (MISCELLANEOUS) ×2 IMPLANT
DRAPE UTILITY 15X26 TOWEL STRL (DRAPES) ×3 IMPLANT
FIBER LASER 365 (Laser) ×3 IMPLANT
FIBER LASER LITHO 273 (Laser) IMPLANT
GLOVE BIO SURGEON STRL SZ 6.5 (GLOVE) ×3 IMPLANT
GOWN STRL REUS W/ TWL LRG LVL3 (GOWN DISPOSABLE) ×4 IMPLANT
GOWN STRL REUS W/ TWL LRG LVL4 (GOWN DISPOSABLE) ×4 IMPLANT
GOWN STRL REUS W/TWL LRG LVL3 (GOWN DISPOSABLE) ×2
GOWN STRL REUS W/TWL LRG LVL4 (GOWN DISPOSABLE) ×2
GUIDEWIRE GREEN .038 145CM (MISCELLANEOUS) ×3 IMPLANT
GUIDEWIRE SUPER STIFF (WIRE) IMPLANT
INFUSOR MANOMETER BAG 3000ML (MISCELLANEOUS) ×3 IMPLANT
INTRODUCER DILATOR DOUBLE (INTRODUCER) IMPLANT
KIT RM TURNOVER CYSTO AR (KITS) ×3 IMPLANT
PACK CYSTO AR (MISCELLANEOUS) ×3 IMPLANT
SCRUB POVIDONE IODINE 4 OZ (MISCELLANEOUS) IMPLANT
SENSORWIRE 0.038 NOT ANGLED (WIRE) ×3
SET CYSTO W/LG BORE CLAMP LF (SET/KITS/TRAYS/PACK) ×3 IMPLANT
SHEATH URETERAL 12FRX35CM (MISCELLANEOUS) IMPLANT
SOL .9 NS 3000ML IRR  AL (IV SOLUTION) ×1
SOL .9 NS 3000ML IRR UROMATIC (IV SOLUTION) ×2 IMPLANT
STENT URET 6FRX24 CONTOUR (STENTS) IMPLANT
STENT URET 6FRX26 CONTOUR (STENTS) IMPLANT
SURGILUBE 2OZ TUBE FLIPTOP (MISCELLANEOUS) ×3 IMPLANT
SYRINGE IRR TOOMEY STRL 70CC (SYRINGE) ×3 IMPLANT
WATER STERILE IRR 1000ML POUR (IV SOLUTION) ×3 IMPLANT
WIRE SENSOR 0.038 NOT ANGLED (WIRE) ×2 IMPLANT

## 2016-01-07 NOTE — Interval H&P Note (Signed)
History and Physical Interval Note:  01/07/2016 4:47 PM  Gale Journey  has presented today for surgery, with the diagnosis of RIGHT URETERAL STONE  The various methods of treatment have been discussed with the patient and family. After consideration of risks, benefits and other options for treatment, the patient has consented to  Procedure(s): URETEROSCOPY WITH HOLMIUM LASER LITHOTRIPSY (Right) CYSTOSCOPY WITH STENT REPLACEMENT (Right) as a surgical intervention .  The patient's history has been reviewed, patient examined, no change in status, stable for surgery.  I have reviewed the patient's chart and labs.  Questions were answered to the patient's satisfaction.    [facility] CTAB  Hollice Espy

## 2016-01-07 NOTE — Op Note (Signed)
Date of procedure: 01/07/16  Preoperative diagnosis:  1. Right ureteral stone 2. History of right urosepsis   Postoperative diagnosis:  1. Same as above   Procedure: 1. Right ureteroscopy 2. Laser lithotripsy 3. Basket extraction of Stone fragment 4. Ureteral stent exchange 5. Right retrograde pyelogram  Surgeon: Hollice Espy, MD  Anesthesia: General  Complications: None  Intraoperative findings: 5 mm distal ureteral stone treated.  No upper tract stone identified.  EBL: Minimal  Specimens: None  Drains: 6 x 24 French double-J ureteral stent on right  Indication: Emma [name] is a 75 y.o. patient with initially presenting with a 4 mm obstructing right distal ureteral stone status post urgent ureteral stent placement.  After reviewing the management options for treatment,she elected to proceed with the above surgical procedure(s). We have discussed the potential benefits and risks of the procedure, side effects of the proposed treatment, the likelihood of the patient achieving the goals of the procedure, and any potential problems that might occur during the procedure or recuperation. Informed consent has been obtained.  Preop urine culture was reviewed, low colony count of highly resistant enterococcus. In the setting of a essentially negative UA other than microscopic blood, so this is likely contaminant. No need for additional treatment.  Description of procedure:  The patient was taken to the operating room and general anesthesia was induced.  The patient was placed in the dorsal lithotomy position, prepped and draped in the usual sterile fashion, and preoperative antibiotics were administered. A preoperative time-out was performed.   A 21 French rigid cystoscope was advanced per urethra into the bladder. Attention was turned to the right ureteral orifice from which a ureteral stent was seen emanating. The distal coil of the stent was grasped and brought to the level of  the urethral meatus. The stent was then then cannulated using a sensor wire up to level of the kidneys. The stent was removed and the wire was snapped in place as a safety wire. A 4.5 French semirigid ureteroscope was then advanced into the distal ureter into the stone was encountered. A 325  laser fiber was then brought in and using the settings of 0.8 J and 10 Hz, the stone was fragmented into proximally 6 smaller pieces. Each of these pieces were basket extracted using a 1.9 [place] to plus nitinol basket. I was able to scope all the way up to the proximal ureter using the semirigid ureteroscope and no additional stones or stone fragments were identified. A Super Stiff wire was then placed through the scope up to level of the renal pelvis. A flexible 7 [place] Wolf ureteroscope was then advanced over the Super Stiff wire up into the kidney alongside of the safety wire. Formal pyeloscopy was then performed and each every calyx was identified. The renal pelvis was somewhat dilated but otherwise no additional findings were appreciated. In the lower pole, there was a intubated calcification consistent with a [name]'s plaque which is likely be 2 mm stone seen on CT scan. This was not treated as it was heavily embedded. The scope was then backed to level of the UPJ. A retrograde pyelogram was then performed. A roadmap of the upper tract collecting system. There was a suggestion of a mild UPJ obstruction based on the somewhat high insertion and redundant nature of the renal pelvis versus extrarenal pelvis. Contrast did drain briskly. The scope was then backed down the length of the ureter reinspected along the way. Again no additional stone fragments identified. A  6 x 24 French double-J ureteral stent was then advanced over the safety wire up to level of the renal pelvis. The wire was partially drawn until full coil was noted within the renal pelvis. The wire was then fully withdrawn and a full coil was noted within the  bladder. The bladder was then drained using the cystoscope sheath. The patient was cleaned and dried, repositioned supine position, reversed from anesthesia, taken the PACU to condition. There are no common locations of this case.  Plan: Patient will return next week for cystoscopy, stent removal.  Hollice Espy, M.D.

## 2016-01-07 NOTE — Anesthesia Procedure Notes (Signed)
Procedure Name: LMA Insertion Date/Time: 01/07/2016 5:17 PM Performed by: Doreen Salvage Pre-anesthesia Checklist: Patient identified, Patient being monitored, Timeout performed, Emergency Drugs available and Suction available Patient Re-evaluated:Patient Re-evaluated prior to inductionOxygen Delivery Method: Circle system utilized Preoxygenation: Pre-oxygenation with 100% oxygen Intubation Type: IV induction Ventilation: Mask ventilation without difficulty LMA: LMA inserted LMA Size: 3.5 Tube type: Oral Number of attempts: 1 Placement Confirmation: positive ETCO2 and breath sounds checked- equal and bilateral Tube secured with: Tape Dental Injury: Teeth and Oropharynx as per pre-operative assessment

## 2016-01-07 NOTE — Discharge Instructions (Signed)
You have a ureteral stent in place.  This is a tube that extends from your kidney to your bladder.  This may cause urinary bleeding, burning with urination, and urinary frequency.  Please call our office or present to the ED if you develop fevers >101 or pain which is not able to be controlled with oral pain medications.  You may be given either Flomax and/ or ditropan to help with bladder spasms and stent pain in addition to pain medications.   ° °[facility] °[address], [address] °[place], [place] [address] °[phone] ° ° ° °AMBULATORY SURGERY  °DISCHARGE INSTRUCTIONS ° ° °1) The drugs that you were given will stay in your system until tomorrow so for the next 24 hours you should not: ° °A) Drive an automobile °B) Make any legal decisions °C) Drink any alcoholic beverage ° ° °2) You may resume regular meals tomorrow.  Today it is better to start with liquids and gradually work up to solid foods. ° °You may eat anything you prefer, but it is better to start with liquids, then soup and crackers, and gradually work up to solid foods. ° ° °3) Please notify your doctor immediately if you have any unusual bleeding, trouble breathing, redness and pain at the surgery site, drainage, fever, or pain not relieved by medication. ° ° ° °4) Additional Instructions: ° ° ° ° ° ° ° °Please contact your physician with any problems or Same Day Surgery at [phone], Monday through Friday 6 am to 4 pm, or [facility] Main number at [phone]. °

## 2016-01-07 NOTE — Anesthesia Preprocedure Evaluation (Signed)
Anesthesia Evaluation  Patient identified by MRN, date of birth, ID band Patient awake    Reviewed: Allergy & Precautions, H&P , NPO status , Patient's Chart, lab work & pertinent test results, reviewed documented beta blocker date and time   History of Anesthesia Complications Negative for: history of anesthetic complications  Airway Mallampati: II  TM Distance: <3 FB Neck ROM: full    Dental  (+) Teeth Intact   Pulmonary shortness of breath and with exertion,    Pulmonary exam normal        Cardiovascular Exercise Tolerance: Good hypertension, + Peripheral Vascular Disease and +CHF  (-) CAD, (-) Past MI, (-) Cardiac Stents and (-) CABG negative cardio ROS Normal cardiovascular exam(-) dysrhythmias (-) Valvular Problems/Murmurs Rhythm:regular Rate:Normal     Neuro/Psych negative neurological ROS  negative psych ROS   GI/Hepatic negative GI ROS, Neg liver ROS, hiatal hernia, GERD  Medicated,  Endo/Other  neg diabetesHypothyroidism   Renal/GU CRFRenal disease  negative genitourinary   Musculoskeletal   Abdominal   Peds  Hematology negative hematology ROS (+) anemia ,   Anesthesia Other Findings Past Medical History: No date: Aromatase inhibitor use 06/19/2014: Breast cancer, left breast ([facility]) No date: CHF (congestive heart failure) ([facility])     Comment: recent sepsis No date: Chronic kidney disease No date: Dizziness No date: Dyspnea     Comment: recently while admitted No date: GERD (gastroesophageal reflux disease) No date: History of hiatal hernia No date: History of kidney stones No date: HOH (hard of hearing)     Comment: severe No date: Hypertension No date: Hypothyroidism 07/2015: Leg DVT (deep venous thromboembolism), acute (*     Comment: left leg after fracture 06/19/2014: Mesenteric mass No date: Pain     Comment: chest pain while admitted No date: Thyroid disease    Reproductive/Obstetrics negative OB ROS                             Anesthesia Physical  Anesthesia Plan  ASA: III and emergent  Anesthesia Plan: General LMA   Post-op Pain Management:    Induction:   Airway Management Planned:   Additional Equipment:   Intra-op Plan:   Post-operative Plan:   Informed Consent: I have reviewed the patients History and Physical, chart, labs and discussed the procedure including the risks, benefits and alternatives for the proposed anesthesia with the patient or authorized representative who has indicated his/her understanding and acceptance.     Plan Discussed with: CRNA  Anesthesia Plan Comments:         Anesthesia Quick Evaluation

## 2016-01-07 NOTE — Transfer of Care (Signed)
Immediate Anesthesia Transfer of Care Note  Patient: Emma [name]  Procedure(s) Performed: Procedure(s): URETEROSCOPY WITH HOLMIUM LASER LITHOTRIPSY (Right) CYSTOSCOPY WITH STENT REPLACEMENT (Right)  Patient Location: PACU  Anesthesia Type:General  Level of Consciousness: lethargic and responds to stimulation  Airway & Oxygen Therapy: Patient Spontanous Breathing and Patient connected to face mask oxygen  Post-op Assessment: Report given to RN and Post -op Vital signs reviewed and stable  Post vital signs: Reviewed and stable  Last Vitals:  Vitals:   01/07/16 1458 01/07/16 1758  BP: (!) 146/55 128/60  Pulse: (!) 55 (!) 56  Resp: 18 18  Temp: 36.7 C 36.7 C    Last Pain:  Vitals:   01/07/16 1458  TempSrc: Oral  PainSc: 0-No pain         Complications: No apparent anesthesia complications

## 2016-01-07 NOTE — H&P (View-Only) (Signed)
12/31/2015 4:58 PM   Emma [name] [birth year] [id number]  Referring provider: [name], MD [address] [facility] [place], [place] [address]  Chief Complaint  Patient presents with  . New Patient (Initial Visit)    Renal stone    HPI: 75 year old female who presents today for follow-up after undergoing a right ureteral stent for an infected right obstructing stone. The patient was admitted to the hospital following the procedure and found to be bacteremic with Escherichia coli both in her urine and her blood. She was discharged home with cefuroxime which she has been taking and tolerating well. The patient was then readmitted with an acute CHF exacerbation several days after discharge. She has now been out of the hospital for a few days and is feeling quite well.She denies any progressive voiding symptoms. She denies any burning. She denies any worsening flank pain.   PMH: Past Medical History:  Diagnosis Date  . Aromatase inhibitor use   . Breast cancer, left breast ([place]) 06/19/2014  . GERD (gastroesophageal reflux disease)   . Hypertension   . 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 Thyroid disease     Surgical History: Past Surgical History:  Procedure Laterality Date  . ABDOMINAL HYSTERECTOMY     partial  . BREAST EXCISIONAL BIOPSY Left 12/2012   +  . BREAST LUMPECTOMY WITH SENTINEL LYMPH NODE BIOPSY Left 2014  . CHOLECYSTECTOMY    . CYSTOSCOPY WITH RETROGRADE PYELOGRAM, URETEROSCOPY AND STENT PLACEMENT Right 12/19/2015   Procedure: CYSTOSCOPY WITH RETROGRADE PYELOGRAM, URETEROSCOPY AND STENT PLACEMENT;  Surgeon: Ardis Hughs, MD;  Location: [facility] ORS;  Service: Urology;  Laterality: Right;    Home Medications:    Medication List       Accurate as of 12/31/15  4:58 PM. Always use your most recent med list.          anastrozole 1 MG tablet Commonly known as:  ARIMIDEX Take 1 tablet (1 mg total) by mouth daily.   aspirin 81 MG EC tablet Take 1  tablet (81 mg total) by mouth daily.   cefUROXime 500 MG tablet Commonly known as:  CEFTIN Take 1 tablet by mouth 2 (two) times daily.   ciprofloxacin 500 MG tablet Commonly known as:  CIPRO Take 1 tablet (500 mg total) by mouth 2 (two) times daily.   ergocalciferol 50000 units capsule Commonly known as:  VITAMIN D2 Take 1 capsule (50,000 Units total) by mouth once a week.   furosemide 40 MG tablet Commonly known as:  LASIX Take 1 tablet (40 mg total) by mouth 2 (two) times daily.   HYDROcodone-acetaminophen 5-325 MG tablet Commonly known as:  NORCO Take 1 tablet by mouth every 6 (six) hours as needed for moderate pain.   labetalol 300 MG tablet Commonly known as:  NORMODYNE TAKE 1 TABLET (300 MG TOTAL) BY MOUTH 2 (TWO) TIMES DAILY.   losartan-hydrochlorothiazide 100-25 MG tablet Commonly known as:  HYZAAR Take 1 tablet by mouth daily.   octreotide 30 MG injection Commonly known as:  SANDOSTATIN LAR Inject 30 mg into the muscle every 28 (twenty-eight) days.   potassium chloride SA 20 MEQ tablet Commonly known as:  K-DUR,KLOR-CON Take 1 tablet (20 mEq total) by mouth 2 (two) times daily. X 2 weeks   SYNTHROID 88 MCG tablet Generic drug:  levothyroxine Take 88 mcg by mouth daily before breakfast.       Allergies:  Allergies  Allergen Reactions  . Sulfa Antibiotics     Other  reaction(s): Kidney Disorder Other reaction(s): Kidney Disorder Other reaction(s): Kidney Disorder    Family History: Family History  Problem Relation Age of Onset  . Breast cancer Mother 81  . Breast cancer Maternal Aunt 68  . Prostate cancer Father     Social History:  reports that she has never smoked. She has never used smokeless tobacco. She reports that she does not drink alcohol or use drugs.  ROS: UROLOGY Frequent Urination?: Yes Hard to postpone urination?: No Burning/pain with urination?: No Get up at night to urinate?: Yes Leakage of urine?: No Urine stream starts and  stops?: No Trouble starting stream?: No Do you have to strain to urinate?: No Blood in urine?: No Urinary tract infection?: Yes Sexually transmitted disease?: No Injury to kidneys or bladder?: No Painful intercourse?: No Weak stream?: No Currently pregnant?: No Vaginal bleeding?: No Last menstrual period?: n  Gastrointestinal Nausea?: Yes Vomiting?: No Indigestion/heartburn?: No Diarrhea?: Yes Constipation?: No  Constitutional Fever: Yes Night sweats?: No Weight loss?: No Fatigue?: Yes  Skin Skin rash/lesions?: No Itching?: No  Eyes Blurred vision?: Yes Double vision?: No  Ears/Nose/Throat Sore throat?: No Sinus problems?: Yes  Hematologic/Lymphatic Swollen glands?: No Easy bruising?: No  Cardiovascular Chest pain?: Yes  Respiratory Cough?: No Shortness of breath?: Yes  Endocrine Excessive thirst?: Yes  Musculoskeletal Back pain?: Yes Joint pain?: Yes  Neurological Headaches?: No Dizziness?: Yes  Psychologic Depression?: No Anxiety?: No  Physical Exam: BP 125/70   Pulse (!) 58   Wt 86.8 kg (191 lb 6.4 oz)   BMI 31.85 kg/m   Constitutional:  Alert and oriented, No acute distress. HEENT: Foley AT, moist mucus membranes.  Trachea midline, no masses. Cardiovascular: No clubbing, cyanosis, or edema. Respiratory: Normal respiratory effort, no increased work of breathing. GI: Abdomen is soft, nontender, nondistended, no abdominal masses GU: No CVA tenderness.  Skin: No rashes, bruises or suspicious lesions. Lymph: No cervical or inguinal adenopathy. Neurologic: Grossly intact, no focal deficits, moving all 4 extremities. Psychiatric: Normal mood and affect.  Laboratory Data: Lab Results  Component Value Date   WBC 6.0 12/24/2015   HGB 11.4 (L) 12/24/2015   HCT 34.9 (L) 12/24/2015   MCV 85.4 12/24/2015   PLT 228 12/24/2015    Lab Results  Component Value Date   CREATININE 0.70 12/26/2015    No results found for: PSA  No results  found for: TESTOSTERONE  Lab Results  Component Value Date   HGBA1C 6.1 (H) 12/19/2015    Urinalysis    Component Value Date/Time   COLORURINE YELLOW (A) 12/24/2015 1754   APPEARANCEUR CLEAR (A) 12/24/2015 1754   APPEARANCEUR Clear 07/12/2013 0941   LABSPEC 1.011 12/24/2015 1754   LABSPEC 1.015 07/12/2013 0941   PHURINE 6.0 12/24/2015 1754   GLUCOSEU NEGATIVE 12/24/2015 1754   GLUCOSEU Negative 07/12/2013 0941   HGBUR 3+ (A) 12/24/2015 1754   BILIRUBINUR NEGATIVE 12/24/2015 1754   BILIRUBINUR Negative 07/12/2013 0941   KETONESUR NEGATIVE 12/24/2015 1754   PROTEINUR NEGATIVE 12/24/2015 1754   NITRITE NEGATIVE 12/24/2015 1754   LEUKOCYTESUR 2+ (A) 12/24/2015 1754   LEUKOCYTESUR Negative 07/12/2013 0941    Pertinent Imaging: I reviewed the patient's CT scan that was obtained in the hospital. This demonstrates a 5 mm right mid ureteral stone that is obstructing with proximal hydronephrosis. The Hounsfield units appear to be roughly 300 on the CT scan. It is see the stone on the CT scout film.  Assessment & Plan:  The patient is recovering well from a  septic right mid ureteral stone. She is being treated for Escherichia coli bacteremia and urinary tract infection. The stone is not well visualized on the CT scan scout and appears to be of ear acid density. As such, I think the best option for this patient would be ureteroscopy and stent exchange.  1. Ureteral stone I spoke with the patient and her son about the different treatment options. Collectively we have agreed to proceed with ureteroscopy, laser lithotripsy and stone removal, stent exchange. The patient should be seen and cleared by her primary care doctor prior to the procedure. - Urine culture (clean catch) - Urinalysis, Complete - CULTURE, URINE COMPREHENSIVE  Cc: Dr. [name], M.D.  No Follow-up on file.  Ardis Hughs, [facility] [address], [place], [place]  [address] [phone]

## 2016-01-08 NOTE — Anesthesia Postprocedure Evaluation (Signed)
Anesthesia Post Note  Patient: Emma [name]  Procedure(s) Performed: Procedure(s) (LRB): URETEROSCOPY WITH HOLMIUM LASER LITHOTRIPSY (Right) CYSTOSCOPY WITH STENT REPLACEMENT (Right)  Patient location during evaluation: PACU Anesthesia Type: General Level of consciousness: awake and alert Pain management: pain level controlled Vital Signs Assessment: post-procedure vital signs reviewed and stable Respiratory status: spontaneous breathing, nonlabored ventilation, respiratory function stable and patient connected to nasal cannula oxygen Cardiovascular status: blood pressure returned to baseline and stable Postop Assessment: no signs of nausea or vomiting Anesthetic complications: no    Last Vitals:  Vitals:   01/07/16 1858 01/07/16 1920  BP: (!) 173/67   Pulse: 65 (!) 56  Resp: (!) 22 10  Temp: 36.3 C 36.7 C    Last Pain:  Vitals:   01/07/16 1920  TempSrc:   PainSc: 0-No pain                 Martha Clan

## 2016-01-09 ENCOUNTER — Encounter: Payer: Self-pay | Admitting: Urology

## 2016-01-11 ENCOUNTER — Other Ambulatory Visit: Payer: Self-pay

## 2016-01-11 ENCOUNTER — Inpatient Hospital Stay: Payer: Medicare Other

## 2016-01-11 ENCOUNTER — Ambulatory Visit
Admission: RE | Admit: 2016-01-11 | Discharge: 2016-01-11 | Disposition: A | Discharge status: Home / Self Care | Payer: Medicare Other | Source: Ambulatory Visit | Attending: Otolaryngology | Admitting: Otolaryngology

## 2016-01-11 ENCOUNTER — Inpatient Hospital Stay (HOSPITAL_BASED_OUTPATIENT_CLINIC_OR_DEPARTMENT_OTHER): Payer: Medicare Other | Admitting: Internal Medicine

## 2016-01-11 ENCOUNTER — Inpatient Hospital Stay: Payer: Medicare Other | Attending: Internal Medicine

## 2016-01-11 VITALS — BP 127/62 | HR 57 | Temp 97.1°F | Wt 192.0 lb

## 2016-01-11 DIAGNOSIS — Z170 Estrogen receptor positive status [ER+]: Secondary | ICD-10-CM | POA: Diagnosis not present

## 2016-01-11 DIAGNOSIS — D509 Iron deficiency anemia, unspecified: Secondary | ICD-10-CM | POA: Diagnosis not present

## 2016-01-11 DIAGNOSIS — Z79899 Other long term (current) drug therapy: Secondary | ICD-10-CM | POA: Insufficient documentation

## 2016-01-11 DIAGNOSIS — Z86718 Personal history of other venous thrombosis and embolism: Secondary | ICD-10-CM | POA: Diagnosis not present

## 2016-01-11 DIAGNOSIS — I10 Essential (primary) hypertension: Secondary | ICD-10-CM | POA: Insufficient documentation

## 2016-01-11 DIAGNOSIS — C50912 Malignant neoplasm of unspecified site of left female breast: Secondary | ICD-10-CM

## 2016-01-11 DIAGNOSIS — E876 Hypokalemia: Secondary | ICD-10-CM | POA: Insufficient documentation

## 2016-01-11 DIAGNOSIS — R197 Diarrhea, unspecified: Secondary | ICD-10-CM | POA: Insufficient documentation

## 2016-01-11 DIAGNOSIS — Q2730 Arteriovenous malformation, site unspecified: Secondary | ICD-10-CM | POA: Diagnosis not present

## 2016-01-11 DIAGNOSIS — I509 Heart failure, unspecified: Secondary | ICD-10-CM | POA: Insufficient documentation

## 2016-01-11 DIAGNOSIS — K6389 Other specified diseases of intestine: Secondary | ICD-10-CM

## 2016-01-11 DIAGNOSIS — Z7901 Long term (current) use of anticoagulants: Secondary | ICD-10-CM | POA: Diagnosis not present

## 2016-01-11 DIAGNOSIS — E041 Nontoxic single thyroid nodule: Secondary | ICD-10-CM | POA: Diagnosis not present

## 2016-01-11 DIAGNOSIS — C7A019 Malignant carcinoid tumor of the small intestine, unspecified portion: Secondary | ICD-10-CM

## 2016-01-11 DIAGNOSIS — K219 Gastro-esophageal reflux disease without esophagitis: Secondary | ICD-10-CM | POA: Insufficient documentation

## 2016-01-11 DIAGNOSIS — Z79811 Long term (current) use of aromatase inhibitors: Secondary | ICD-10-CM | POA: Insufficient documentation

## 2016-01-11 LAB — CBC WITH DIFFERENTIAL/PLATELET
BASOS ABS: 0 10*3/uL (ref 0–0.1)
Basophils Relative: 1 %
EOS PCT: 8 %
Eosinophils Absolute: 0.6 10*3/uL (ref 0–0.7)
HCT: 38.3 % (ref 35.0–47.0)
HEMOGLOBIN: 12.7 g/dL (ref 12.0–16.0)
LYMPHS PCT: 10 %
Lymphs Abs: 0.7 10*3/uL — ABNORMAL LOW (ref 1.0–3.6)
MCH: 27.9 pg (ref 26.0–34.0)
MCHC: 33.2 g/dL (ref 32.0–36.0)
MCV: 84.1 fL (ref 80.0–100.0)
Monocytes Absolute: 0.7 10*3/uL (ref 0.2–0.9)
Monocytes Relative: 11 %
NEUTROS ABS: 4.8 10*3/uL (ref 1.4–6.5)
NEUTROS PCT: 70 %
PLATELETS: 189 10*3/uL (ref 150–440)
RBC: 4.55 MIL/uL (ref 3.80–5.20)
RDW: 15.9 % — ABNORMAL HIGH (ref 11.5–14.5)
WBC: 6.8 10*3/uL (ref 3.6–11.0)

## 2016-01-11 LAB — COMPREHENSIVE METABOLIC PANEL
ALK PHOS: 84 U/L (ref 38–126)
ALT: 15 U/L (ref 14–54)
AST: 32 U/L (ref 15–41)
Albumin: 3.8 g/dL (ref 3.5–5.0)
Anion gap: 10 (ref 5–15)
BUN: 17 mg/dL (ref 6–20)
CALCIUM: 9.4 mg/dL (ref 8.9–10.3)
CO2: 29 mmol/L (ref 22–32)
CREATININE: 0.85 mg/dL (ref 0.44–1.00)
Chloride: 97 mmol/L — ABNORMAL LOW (ref 101–111)
Glucose, Bld: 136 mg/dL — ABNORMAL HIGH (ref 65–99)
Potassium: 3.2 mmol/L — ABNORMAL LOW (ref 3.5–5.1)
Sodium: 136 mmol/L (ref 135–145)
Total Bilirubin: 0.9 mg/dL (ref 0.3–1.2)
Total Protein: 7.1 g/dL (ref 6.5–8.1)

## 2016-01-11 MED ORDER — POTASSIUM CHLORIDE CRYS ER 20 MEQ PO TBCR: EXTENDED_RELEASE_TABLET | ORAL | 3 refills | Status: DC

## 2016-01-11 MED ORDER — OCTREOTIDE ACETATE 20 MG IM KIT
20.0000 mg | PACK | Freq: Once | INTRAMUSCULAR | Status: AC
  Administered 2016-01-11: 20 mg via INTRAMUSCULAR
  Filled 2016-01-11: qty 1

## 2016-01-11 NOTE — Assessment & Plan Note (Signed)
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 Octreotide scan from OCT 2nd  2017 no evidence of progression. Continue Octroetide shots monthly.    # Iron deficiency anemia/Hx of AV malformation- Status post Feraheme April 2017- today hemoglobin is 12.   # LEFT BREAST CA STAGE I;  Restart Arimidex.  Clinically no evidence of recurrence.  # Hypokalemia- recommend K dur 20 meq/day.   # Recent CHF/ UTI with sepsis [s/p kidney stone extraction]  # Follow-up with me in approximately 2 month/labs;  CBC BMP monthly/shots; Urine 5HIAA prior to next visit.

## 2016-01-11 NOTE — Progress Notes (Signed)
[facility] OFFICE PROGRESS NOTE  [name], [name]   SUMMARY OF ONCOLOGIC HISTORY:  Oncology History   # JUNE 2015-MESENTERIC MASS [~5-6cm] Presumed CARCINOID with mets to liver [AUG 2016-Octreoscan; Dr.Hurwitz; [facility]]; FEB 2016- START OCTREOTIDE LAR qM; Octreo scan- FEB 2017- STABLE; cont Octreotide'; OCT 5th OCTREO SCAN- STABLE; mesenteric mass present.   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] s/p Lumpec & RT; ER/PR > 90%; Her2 Neu-NEG; Arimidex; MAY 2016- Mammo-NEG; May 1st HOLD Arimidex sec to myalgias  # IDA s/p IV iron; last March 2014; [place] [Dr.Elliot; Jan 2016] colon angiotele- s/p Argon laser; April 2017- [facility]hem  # DVT [taken off eliquis DEC 2017]; NOV -DEC 2017 CHF/ UTI with sepsis- stone extracted [Dr.Brandon]  # Thyroid nodule- [facility] s/p Bx [NOV 2016]/ right adnexal mass [stable since 2010]; hard of hearing     Carcinoid tumor of small intestine, malignant ([place])   10/08/2015 Initial Diagnosis    Carcinoid tumor of small intestine, malignant ([place])       INTERVAL HISTORY:  A very pleasant 75 year old female patient with above history of breast cancer and also mesenteric mass/small bowel carcinoid is here for follow-up.   Patient came was admitted to the hospital twice once for UTI with sepsis; Needed to have stone extracted. She was also recently with the hospital for diastolic CHF.  Patient's breathing is improved. Patient continues to have intermittent diarrhea chronic. Not any worse. Denies any blood in stools or black colored stools. Chronic mild fatigue.  No nausea no vomiting. No abdominal pain. No diarrhea. No weight loss.  REVIEW OF SYSTEMS:  A complete 10 point review of system is done which is negative except mentioned above/history of present illness.   PAST MEDICAL HISTORY :  Past Medical History:  Diagnosis Date  . Aromatase inhibitor use   . Breast cancer, left breast  ([place]) 06/19/2014  . CHF (congestive heart failure) ([facility])    recent sepsis  . Chronic kidney disease   . Dizziness   . Dyspnea    recently while admitted  . GERD (gastroesophageal reflux disease)   . History of hiatal hernia   . History of kidney stones   . HOH (hard of hearing)    severe  . Hypertension   . Hypothyroidism   . Leg DVT (deep venous thromboembolism), acute ([place]) 07/2015   left leg after fracture  . 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 Pain    chest pain while admitted  . Thyroid disease     PAST SURGICAL HISTORY :   Past Surgical History:  Procedure Laterality Date  . ABDOMINAL HYSTERECTOMY     partial  . BREAST EXCISIONAL BIOPSY Left 12/2012   +  . BREAST LUMPECTOMY WITH SENTINEL LYMPH NODE BIOPSY Left 2014  . CHOLECYSTECTOMY    . CYSTOSCOPY W/ URETERAL STENT PLACEMENT Right 01/07/2016   Procedure: CYSTOSCOPY WITH STENT REPLACEMENT;  Surgeon: Hollice Espy, MD;  Location: [facility] ORS;  Service: Urology;  Laterality: Right;  . CYSTOSCOPY WITH RETROGRADE PYELOGRAM, URETEROSCOPY AND STENT PLACEMENT Right 12/19/2015   Procedure: CYSTOSCOPY WITH RETROGRADE PYELOGRAM, URETEROSCOPY AND STENT PLACEMENT;  Surgeon: Ardis Hughs, MD;  Location: [facility] ORS;  Service: Urology;  Laterality: Right;  . URETEROSCOPY WITH HOLMIUM LASER LITHOTRIPSY Right 01/07/2016   Procedure: URETEROSCOPY WITH HOLMIUM LASER LITHOTRIPSY;  Surgeon: Hollice Espy, MD;  Location: [facility] ORS;  Service: Urology;  Laterality: Right;    FAMILY HISTORY :  Family History  Problem Relation Age of Onset  . Breast cancer Mother 41  . Breast cancer Maternal Aunt 68  . Prostate cancer Father     SOCIAL HISTORY:   Social History  Substance Use Topics  . Smoking status: Never Smoker  . Smokeless tobacco: Never Used  . Alcohol use No    ALLERGIES:  is allergic to sulfa antibiotics.  MEDICATIONS:  Current Outpatient Prescriptions  Medication Sig Dispense Refill  . anastrozole (ARIMIDEX) 1 MG tablet  Take 1 tablet (1 mg total) by mouth daily. 30 tablet 6  . aspirin EC 81 MG EC tablet Take 1 tablet (81 mg total) by mouth daily. 30 tablet 2  . Camphor-Menthol-Capsicum (TIGER BALM PAIN RELIEVING) 80-24-16 MG PTCH Place 1 patch onto the skin daily as needed (for knee pain.).    Marland Kitchen ergocalciferol (VITAMIN D2) 50000 units capsule Take 1 capsule (50,000 Units total) by mouth once a week. (Patient taking differently: Take 50,000 Units by mouth every Monday. ) 12 capsule 1  . furosemide (LASIX) 40 MG tablet Take 1 tablet (40 mg total) by mouth 2 (two) times daily. (Patient taking differently: Take 40 mg by mouth daily. ) 60 tablet 0  . labetalol (NORMODYNE) 300 MG tablet TAKE 1 TABLET (300 MG TOTAL) BY MOUTH 2 (TWO) TIMES DAILY.    Marland Kitchen levothyroxine (SYNTHROID) 88 MCG tablet Take 88 mcg by mouth daily before breakfast.     . losartan-hydrochlorothiazide (HYZAAR) 100-25 MG tablet Take 1 tablet by mouth daily.    Marland Kitchen octreotide (SANDOSTATIN LAR) 30 MG injection Inject 30 mg into the muscle every 28 (twenty-eight) days.     Marland Kitchen acetaminophen (TYLENOL) 500 MG tablet Take 500 mg by mouth every 6 (six) hours as needed (for pain.).    Marland Kitchen potassium chloride SA (K-DUR,KLOR-CON) 20 MEQ tablet Once a day. 30 tablet 3   No current facility-administered medications for this visit.     PHYSICAL EXAMINATION: ECOG PERFORMANCE STATUS: 0 - Asymptomatic  BP 127/62 (BP Location: Right Arm, Patient Position: Sitting)   Pulse (!) 57   Temp 97.1 F (36.2 C) (Tympanic)   Wt 192 lb (87.1 kg)   BMI 31.95 kg/m   Filed Weights   01/11/16 0850  Weight: 192 lb (87.1 kg)   GENERAL: Well-nourished well-developed; Alert, no distress and comfortable. Alone; hard of hearing.She is in a wheelchair. Alone.  EYES: no pallor or icterus OROPHARYNX: no thrush or ulceration; good dentition  NECK: supple, no masses felt LYMPH: no palpable lymphadenopathy in the cervical, axillary or inguinal regions LUNGS: clear to auscultation and  No wheeze or crackles HEART/CVS: regular rate & rhythm and no murmurs; No lower extremity edema; left foot in a brace. ABDOMEN:abdomen soft, non-tender and normal bowel sounds Musculoskeletal:no cyanosis of digits and no clubbing  PSYCH: alert & oriented x 3 with fluent speech NEURO: no focal motor/sensory deficits SKIN: no rashes or significant lesions  LABORATORY DATA:  I have reviewed the data as listed    Component Value Date/Time   NA 136 01/11/2016 0803   NA 137 07/12/2013 0841   K 3.2 (L) 01/11/2016 0803   K 4.7 07/12/2013 0841   CL 97 (L) 01/11/2016 0803   CL 107 07/12/2013 0841   CO2 29 01/11/2016 0803   CO2 20 (L) 07/12/2013 0841   GLUCOSE 136 (H) 01/11/2016 0803   GLUCOSE 122 (H) 07/12/2013 0841   BUN 17 01/11/2016 0803   BUN 18 07/12/2013 0841   CREATININE 0.85 01/11/2016 0803  CREATININE 0.64 05/22/2014 1111   CALCIUM 9.4 01/11/2016 0803   CALCIUM 9.2 07/12/2013 0841   PROT 7.1 01/11/2016 0803   PROT 7.0 05/22/2014 1111   ALBUMIN 3.8 01/11/2016 0803   ALBUMIN 4.2 05/22/2014 1111   AST 32 01/11/2016 0803   AST 29 05/22/2014 1111   ALT 15 01/11/2016 0803   ALT 17 05/22/2014 1111   ALKPHOS 84 01/11/2016 0803   ALKPHOS 95 05/22/2014 1111   BILITOT 0.9 01/11/2016 0803   BILITOT 0.8 05/22/2014 1111   GFRNONAA >60 01/11/2016 0803   GFRNONAA >60 05/22/2014 1111   GFRAA >60 01/11/2016 0803   GFRAA >60 05/22/2014 1111    No results found for: SPEP, UPEP  Lab Results  Component Value Date   WBC 6.8 01/11/2016   NEUTROABS 4.8 01/11/2016   HGB 12.7 01/11/2016   HCT 38.3 01/11/2016   MCV 84.1 01/11/2016   PLT 189 01/11/2016      Chemistry      Component Value Date/Time   NA 136 01/11/2016 0803   NA 137 07/12/2013 0841   K 3.2 (L) 01/11/2016 0803   K 4.7 07/12/2013 0841   CL 97 (L) 01/11/2016 0803   CL 107 07/12/2013 0841   CO2 29 01/11/2016 0803   CO2 20 (L) 07/12/2013 0841   BUN 17 01/11/2016 0803   BUN 18 07/12/2013 0841   CREATININE 0.85  01/11/2016 0803   CREATININE 0.64 05/22/2014 1111      Component Value Date/Time   CALCIUM 9.4 01/11/2016 0803   CALCIUM 9.2 07/12/2013 0841   ALKPHOS 84 01/11/2016 0803   ALKPHOS 95 05/22/2014 1111   AST 32 01/11/2016 0803   AST 29 05/22/2014 1111   ALT 15 01/11/2016 0803   ALT 17 05/22/2014 1111   BILITOT 0.9 01/11/2016 0803   BILITOT 0.8 05/22/2014 1111     IMPRESSION: Again identified increased tracer localization within a mesenteric mass in the RIGHT mid abdomen compatible with carcinoid tumor, mass minimally smaller than on previous exam.  Grossly unchanged metastatic focus LEFT lobe liver.  Grossly stable omental tumor implants in the infraumbilical region.  Focus of subtle small-bowel localization of tracer in RIGHT mid abdomen anterior to mesenteric mass question tumor versus serosal implant.  Small RIGHT renal cyst and nonobstructing RIGHT renal calculus.  Enlarged RIGHT ovary for age, unchanged.  Stable complex partially calcified cystic lesion in spleen.   Electronically Signed   By: Lavonia Dana M.D.   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 Octreotide scan from OCT 2nd  2017 no evidence of progression. Continue Octroetide shots monthly.    # Iron deficiency anemia/Hx of AV malformation- Status post Feraheme April 2017- today hemoglobin is 12.   # LEFT BREAST CA STAGE I;  Restart Arimidex.  Clinically no evidence of recurrence.  # Hypokalemia- recommend K dur 20 meq/day.   # Recent CHF/ UTI with sepsis [s/p kidney stone extraction]  # Follow-up with me in approximately 2 month/labs;  CBC BMP monthly/shots; Urine 5HIAA prior to next visit.       [name], MD 01/12/2016 11:27 AM

## 2016-01-11 NOTE — Progress Notes (Signed)
Patient here today for follow up Carcinoid tumor of small intestine.

## 2016-01-16 ENCOUNTER — Ambulatory Visit: Payer: Medicare Other | Admitting: Family

## 2016-01-17 ENCOUNTER — Encounter: Payer: Self-pay | Admitting: Urology

## 2016-01-17 ENCOUNTER — Encounter: Payer: Medicare Other | Admitting: Urology

## 2016-01-17 VITALS — BP 117/72 | HR 70 | Wt 192.6 lb

## 2016-01-17 DIAGNOSIS — N200 Calculus of kidney: Secondary | ICD-10-CM

## 2016-01-17 LAB — URINALYSIS, COMPLETE
Bilirubin, UA: NEGATIVE
Glucose, UA: NEGATIVE
Ketones, UA: NEGATIVE
Nitrite, UA: NEGATIVE
PH UA: 5 (ref 5.0–7.5)
Specific Gravity, UA: 1.015 (ref 1.005–1.030)
UUROB: 0.2 mg/dL (ref 0.2–1.0)

## 2016-01-17 LAB — MICROSCOPIC EXAMINATION

## 2016-01-17 MED ORDER — CIPROFLOXACIN HCL 500 MG PO TABS: 500.0000 mg | ORAL_TABLET | Freq: Once | ORAL | Status: DC

## 2016-01-17 MED ORDER — LIDOCAINE HCL 2 % EX GEL: 1.0000 "application " | Freq: Once | CUTANEOUS | Status: DC

## 2016-01-17 NOTE — Progress Notes (Signed)
This encounter was created in error - please disregard.

## 2016-01-18 ENCOUNTER — Other Ambulatory Visit: Payer: Medicare Other

## 2016-01-18 ENCOUNTER — Telehealth: Payer: Self-pay

## 2016-01-18 DIAGNOSIS — R300 Dysuria: Secondary | ICD-10-CM

## 2016-01-18 NOTE — Telephone Encounter (Signed)
I called and spoke w/ [name] the daughter about bring the pt in the office to give [place] a urine sample to send for urine culture. The pt came in on yesterday and the urine culture was not sent.

## 2016-01-25 ENCOUNTER — Inpatient Hospital Stay
Admission: EM | Admit: 2016-01-25 | Discharge: 2016-01-29 | DRG: 689 | Disposition: A | Discharge status: Home / Self Care | Payer: Medicare Other | Attending: Internal Medicine | Admitting: Internal Medicine

## 2016-01-25 ENCOUNTER — Telehealth: Payer: Self-pay | Admitting: Family Medicine

## 2016-01-25 ENCOUNTER — Telehealth: Payer: Self-pay

## 2016-01-25 ENCOUNTER — Encounter: Payer: Self-pay | Admitting: Emergency Medicine

## 2016-01-25 DIAGNOSIS — I5033 Acute on chronic diastolic (congestive) heart failure: Secondary | ICD-10-CM | POA: Diagnosis present

## 2016-01-25 DIAGNOSIS — N189 Chronic kidney disease, unspecified: Secondary | ICD-10-CM | POA: Diagnosis present

## 2016-01-25 DIAGNOSIS — N390 Urinary tract infection, site not specified: Secondary | ICD-10-CM | POA: Diagnosis not present

## 2016-01-25 DIAGNOSIS — E039 Hypothyroidism, unspecified: Secondary | ICD-10-CM | POA: Diagnosis present

## 2016-01-25 DIAGNOSIS — K219 Gastro-esophageal reflux disease without esophagitis: Secondary | ICD-10-CM | POA: Diagnosis present

## 2016-01-25 DIAGNOSIS — E876 Hypokalemia: Secondary | ICD-10-CM | POA: Diagnosis present

## 2016-01-25 DIAGNOSIS — Z853 Personal history of malignant neoplasm of breast: Secondary | ICD-10-CM

## 2016-01-25 DIAGNOSIS — Z95828 Presence of other vascular implants and grafts: Secondary | ICD-10-CM

## 2016-01-25 DIAGNOSIS — Z803 Family history of malignant neoplasm of breast: Secondary | ICD-10-CM

## 2016-01-25 DIAGNOSIS — Z1624 Resistance to multiple antibiotics: Secondary | ICD-10-CM | POA: Diagnosis present

## 2016-01-25 DIAGNOSIS — B952 Enterococcus as the cause of diseases classified elsewhere: Secondary | ICD-10-CM | POA: Diagnosis present

## 2016-01-25 DIAGNOSIS — Z7982 Long term (current) use of aspirin: Secondary | ICD-10-CM

## 2016-01-25 DIAGNOSIS — I130 Hypertensive heart and chronic kidney disease with heart failure and stage 1 through stage 4 chronic kidney disease, or unspecified chronic kidney disease: Secondary | ICD-10-CM | POA: Diagnosis present

## 2016-01-25 DIAGNOSIS — Z1621 Resistance to vancomycin: Secondary | ICD-10-CM | POA: Diagnosis present

## 2016-01-25 DIAGNOSIS — N3090 Cystitis, unspecified without hematuria: Secondary | ICD-10-CM

## 2016-01-25 DIAGNOSIS — H9190 Unspecified hearing loss, unspecified ear: Secondary | ICD-10-CM | POA: Diagnosis present

## 2016-01-25 DIAGNOSIS — R531 Weakness: Secondary | ICD-10-CM

## 2016-01-25 DIAGNOSIS — Z85060 Personal history of malignant carcinoid tumor of small intestine: Secondary | ICD-10-CM

## 2016-01-25 LAB — BASIC METABOLIC PANEL
ANION GAP: 8 (ref 5–15)
BUN: 17 mg/dL (ref 6–20)
CHLORIDE: 102 mmol/L (ref 101–111)
CO2: 28 mmol/L (ref 22–32)
CREATININE: 0.79 mg/dL (ref 0.44–1.00)
Calcium: 9.3 mg/dL (ref 8.9–10.3)
GFR calc non Af Amer: >60 mL/min (ref >60)
Glucose, Bld: 106 mg/dL — ABNORMAL HIGH (ref 65–99)
POTASSIUM: 3.3 mmol/L — AB (ref 3.5–5.1)
SODIUM: 138 mmol/L (ref 135–145)

## 2016-01-25 LAB — CBC WITH DIFFERENTIAL/PLATELET
BASOS PCT: 1 %
Basophils Absolute: 0.1 10*3/uL (ref 0–0.1)
Eosinophils Absolute: 0.3 10*3/uL (ref 0–0.7)
Eosinophils Relative: 5 %
HEMATOCRIT: 36.4 % (ref 35.0–47.0)
HEMOGLOBIN: 12.2 g/dL (ref 12.0–16.0)
LYMPHS ABS: 0.6 10*3/uL — AB (ref 1.0–3.6)
Lymphocytes Relative: 9 %
MCH: 28.2 pg (ref 26.0–34.0)
MCHC: 33.4 g/dL (ref 32.0–36.0)
MCV: 84.5 fL (ref 80.0–100.0)
MONOS PCT: 9 %
Monocytes Absolute: 0.6 10*3/uL (ref 0.2–0.9)
NEUTROS ABS: 5.3 10*3/uL (ref 1.4–6.5)
NEUTROS PCT: 76 %
Platelets: 217 10*3/uL (ref 150–440)
RBC: 4.31 MIL/uL (ref 3.80–5.20)
RDW: 15.6 % — ABNORMAL HIGH (ref 11.5–14.5)
WBC: 6.9 10*3/uL (ref 3.6–11.0)

## 2016-01-25 LAB — CULTURE, URINE COMPREHENSIVE

## 2016-01-25 LAB — MAGNESIUM: Magnesium: 1.8 mg/dL (ref 1.7–2.4)

## 2016-01-25 MED ORDER — LABETALOL HCL 200 MG PO TABS
300.0000 mg | ORAL_TABLET | Freq: Two times a day (BID) | ORAL | Status: DC
  Administered 2016-01-25 – 2016-01-29 (×7): 300 mg via ORAL
  Filled 2016-01-25 (×8): qty 1

## 2016-01-25 MED ORDER — VANCOMYCIN HCL 10 G IV SOLR
1500.0000 mg | INTRAVENOUS | Status: DC
  Filled 2016-01-25: qty 1500

## 2016-01-25 MED ORDER — LEVOTHYROXINE SODIUM 88 MCG PO TABS
88.0000 ug | ORAL_TABLET | Freq: Every day | ORAL | Status: DC
  Administered 2016-01-26 – 2016-01-29 (×4): 88 ug via ORAL
  Filled 2016-01-25 (×4): qty 1

## 2016-01-25 MED ORDER — LOSARTAN POTASSIUM-HCTZ 100-25 MG PO TABS: 1.0000 | ORAL_TABLET | Freq: Every day | ORAL | Status: DC

## 2016-01-25 MED ORDER — ACETAMINOPHEN 325 MG PO TABS: 650.0000 mg | ORAL_TABLET | Freq: Four times a day (QID) | ORAL | Status: DC | PRN

## 2016-01-25 MED ORDER — POTASSIUM CHLORIDE CRYS ER 20 MEQ PO TBCR
40.0000 meq | EXTENDED_RELEASE_TABLET | Freq: Once | ORAL | Status: AC
  Administered 2016-01-25: 40 meq via ORAL
  Filled 2016-01-25: qty 2

## 2016-01-25 MED ORDER — FUROSEMIDE 40 MG PO TABS
40.0000 mg | ORAL_TABLET | Freq: Every day | ORAL | Status: DC
  Administered 2016-01-25 – 2016-01-29 (×5): 40 mg via ORAL
  Filled 2016-01-25 (×5): qty 1

## 2016-01-25 MED ORDER — ASPIRIN EC 81 MG PO TBEC
81.0000 mg | DELAYED_RELEASE_TABLET | Freq: Every day | ORAL | Status: DC
  Administered 2016-01-25 – 2016-01-29 (×5): 81 mg via ORAL
  Filled 2016-01-25 (×5): qty 1

## 2016-01-25 MED ORDER — ONDANSETRON HCL 4 MG PO TABS: 4.0000 mg | ORAL_TABLET | Freq: Four times a day (QID) | ORAL | Status: DC | PRN

## 2016-01-25 MED ORDER — ANASTROZOLE 1 MG PO TABS
1.0000 mg | ORAL_TABLET | Freq: Every day | ORAL | Status: DC
  Administered 2016-01-25 – 2016-01-29 (×5): 1 mg via ORAL
  Filled 2016-01-25 (×5): qty 1

## 2016-01-25 MED ORDER — ENOXAPARIN SODIUM 40 MG/0.4ML ~~LOC~~ SOLN
40.0000 mg | SUBCUTANEOUS | Status: DC
  Administered 2016-01-25 – 2016-01-28 (×4): 40 mg via SUBCUTANEOUS
  Filled 2016-01-25 (×4): qty 0.4

## 2016-01-25 MED ORDER — ALBUTEROL SULFATE (2.5 MG/3ML) 0.083% IN NEBU: 2.5000 mg | INHALATION_SOLUTION | RESPIRATORY_TRACT | Status: DC | PRN

## 2016-01-25 MED ORDER — HYDROCODONE-ACETAMINOPHEN 5-325 MG PO TABS: 1.0000 | ORAL_TABLET | ORAL | Status: DC | PRN

## 2016-01-25 MED ORDER — SODIUM CHLORIDE 0.9 % IV SOLN
INTRAVENOUS | Status: DC
  Administered 2016-01-25 – 2016-01-28 (×6): via INTRAVENOUS

## 2016-01-25 MED ORDER — HYDROCHLOROTHIAZIDE 25 MG PO TABS
25.0000 mg | ORAL_TABLET | Freq: Every day | ORAL | Status: DC
  Administered 2016-01-25 – 2016-01-29 (×5): 25 mg via ORAL
  Filled 2016-01-25 (×5): qty 1

## 2016-01-25 MED ORDER — ONDANSETRON HCL 4 MG/2ML IJ SOLN: 4.0000 mg | Freq: Four times a day (QID) | INTRAMUSCULAR | Status: DC | PRN

## 2016-01-25 MED ORDER — SODIUM CHLORIDE 0.9 % IV BOLUS (SEPSIS)
1000.0000 mL | Freq: Once | INTRAVENOUS | Status: AC
  Administered 2016-01-25: 1000 mL via INTRAVENOUS

## 2016-01-25 MED ORDER — LOSARTAN POTASSIUM 50 MG PO TABS
100.0000 mg | ORAL_TABLET | Freq: Every day | ORAL | Status: DC
  Administered 2016-01-25 – 2016-01-29 (×5): 100 mg via ORAL
  Filled 2016-01-25 (×5): qty 2

## 2016-01-25 MED ORDER — POTASSIUM CHLORIDE CRYS ER 20 MEQ PO TBCR
20.0000 meq | EXTENDED_RELEASE_TABLET | Freq: Every day | ORAL | Status: DC
  Administered 2016-01-26 – 2016-01-29 (×4): 20 meq via ORAL
  Filled 2016-01-25 (×4): qty 1

## 2016-01-25 MED ORDER — ACETAMINOPHEN 650 MG RE SUPP
650.0000 mg | Freq: Four times a day (QID) | RECTAL | Status: DC | PRN
Start: 2016-01-25 — End: 2016-01-29

## 2016-01-25 MED ORDER — VANCOMYCIN HCL 10 G IV SOLR
1500.0000 mg | INTRAVENOUS | Status: AC
  Administered 2016-01-25: 1500 mg via INTRAVENOUS
  Filled 2016-01-25: qty 1500

## 2016-01-25 NOTE — Telephone Encounter (Signed)
Pt daughter, [name], called inquiring about pt ucx and abx. Made aware of vancomycin needing to be IV which is why we advised to go to the hospital. [name] voiced understanding stating pt is already there.

## 2016-01-25 NOTE — ED Notes (Signed)
Pt with redness around IV site, not complaining of itching or pain.  MD notified, verbal order to slow infusion rate by half.  Will monitor site.

## 2016-01-25 NOTE — H&P (Addendum)
[facility] [place] NAME: Emma [name]    MR#:  [id number]  DATE OF BIRTH:  09-Oct-1940  DATE OF ADMISSION:  01/25/2016  PRIMARY CARE PHYSICIAN: [name], MD   REQUESTING/REFERRING PHYSICIAN: Carrie Mew, MD  CHIEF COMPLAINT:   Chief Complaint  Patient presents with  . Urinary Tract Infection   Dysuria, urinary frequency and hematuria HISTORY OF PRESENT ILLNESS:  Emma [name]  is a 75 y.o. female with a known history of UTI, nephrolithiasis, status post stent placement. Hypertension, CHF, CKD and breast cancer. The patient was sent to ED from urology clinic today due to multi-drug-resistant UTI. The patient was hospitalized 2 weeks ago due to sepsis related to kidney stone and UTI. She got cystoscopy and urinary stent placement. She was placed on antibiotics but the urine culture result comes back today, which show multi drug resistant except vancomycin. Urologist suggested vancomycin and PICC line placement. The patient complains of dysuria, urinary frequency and hematuria. But no fever or chills.  PAST MEDICAL HISTORY:   Past Medical History:  Diagnosis Date  . Aromatase inhibitor use   . Breast cancer, left breast ([place]) 06/19/2014  . CHF (congestive heart failure) (HCC)    recent sepsis  . Chronic kidney disease   . Dizziness   . Dyspnea    recently while admitted  . GERD (gastroesophageal reflux disease)   . History of hiatal hernia   . History of kidney stones   . HOH (hard of hearing)    severe  . Hypertension   . Hypothyroidism   . Leg DVT (deep venous thromboembolism), acute ([place]) 07/2015   left leg after fracture  . 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 Pain    chest pain while admitted  . Thyroid disease     PAST SURGICAL HISTORY:   Past Surgical History:  Procedure Laterality Date  . ABDOMINAL HYSTERECTOMY     partial  . BREAST EXCISIONAL BIOPSY Left 12/2012   +  . BREAST LUMPECTOMY WITH SENTINEL LYMPH  NODE BIOPSY Left 2014  . CHOLECYSTECTOMY    . CYSTOSCOPY W/ URETERAL STENT PLACEMENT Right 01/07/2016   Procedure: CYSTOSCOPY WITH STENT REPLACEMENT;  Surgeon: Hollice Espy, MD;  Location: [facility] ORS;  Service: Urology;  Laterality: Right;  . CYSTOSCOPY WITH RETROGRADE PYELOGRAM, URETEROSCOPY AND STENT PLACEMENT Right 12/19/2015   Procedure: CYSTOSCOPY WITH RETROGRADE PYELOGRAM, URETEROSCOPY AND STENT PLACEMENT;  Surgeon: Ardis Hughs, MD;  Location: [facility] OR[facility];  Service: Urology;  Laterality: Right;  . URETEROSCOPY WITH HOLMIUM LASER LITHOTRIPSY Right 01/07/2016   Procedure: URETEROSCOPY WITH HOLMIUM LASER LITHOTRIPSY;  Surgeon: Hollice Espy, MD;  Location: [facility] OR[facility];  Service: Urology;  Laterality: Right;    SOCIAL HISTORY:   Social History  Substance Use Topics  . Smoking status: Never Smoker  . Smokeless tobacco: Never Used  . Alcohol use No    FAMILY HISTORY:   Family History  Problem Relation Age of Onset  . Breast cancer Mother 32  . Breast cancer Maternal Aunt 68  . Prostate cancer Father     DRUG ALLERGIES:   Allergies  Allergen Reactions  . Sulfa Antibiotics     Other reaction(s): Kidney Disorder Other reaction(s): Kidney Disorder Other reaction(s): Kidney Disorder    REVIEW OF SYSTEMS:   Review of Systems  Constitutional: Positive for malaise/fatigue. Negative for chills and fever.  HENT: Positive for hearing loss. Negative for congestion, ear pain and tinnitus.   Eyes: Negative for blurred vision and  double vision.  Respiratory: Negative for cough, shortness of breath and stridor.   Cardiovascular: Negative for chest pain and leg swelling.  Gastrointestinal: Positive for diarrhea. Negative for abdominal pain, blood in stool, melena, nausea and vomiting.  Genitourinary: Positive for dysuria, frequency, hematuria and urgency. Negative for flank pain.  Musculoskeletal: Negative for joint pain.  Neurological: Positive for weakness. Negative for dizziness,  focal weakness and loss of consciousness.  Psychiatric/Behavioral: Negative for depression. The patient is not nervous/anxious.     MEDICATIONS AT HOME:   Prior to Admission medications   Medication Sig Start Date End Date Taking? Authorizing Provider  acetaminophen (TYLENOL) 500 MG tablet Take 500 mg by mouth every 6 (six) hours as needed (for pain.).   Yes Historical Provider, MD  anastrozole (ARIMIDEX) 1 MG tablet Take 1 tablet (1 mg total) by mouth daily. 11/06/15  Yes [name], MD  aspirin EC 81 MG EC tablet Take 1 tablet (81 mg total) by mouth daily. 12/27/15  Yes Demetrios Loll, MD  Camphor-Menthol-Capsicum (TIGER BALM PAIN RELIEVING) 80-24-16 MG PTCH Place 1 patch onto the skin daily as needed (for knee pain.).   Yes Historical Provider, MD  ergocalciferol (VITAMIN D2) 50000 units capsule Take 1 capsule (50,000 Units total) by mouth once a week. Patient taking differently: Take 50,000 Units by mouth every Monday.  11/06/15  Yes [name], MD  furosemide (LASIX) 40 MG tablet Take 1 tablet (40 mg total) by mouth 2 (two) times daily. Patient taking differently: Take 40 mg by mouth daily.  12/26/15  Yes Demetrios Loll, MD  labetalol (NORMODYNE) 300 MG tablet TAKE 1 TABLET (300 MG TOTAL) BY MOUTH 2 (TWO) TIMES DAILY. 08/09/14  Yes Historical Provider, MD  levothyroxine (SYNTHROID) 88 MCG tablet Take 88 mcg by mouth daily before breakfast.  06/20/14  Yes Historical Provider, MD  losartan-hydrochlorothiazide (HYZAAR) 100-25 MG tablet Take 1 tablet by mouth daily.   Yes Historical Provider, MD  octreotide (SANDOSTATIN LAR) 30 MG injection Inject 30 mg into the muscle every 28 (twenty-eight) days.    Yes Historical Provider, MD  potassium chloride SA (K-DUR,KLOR-CON) 20 MEQ tablet Once a day. 01/11/16  Yes [name], MD      VITAL SIGNS:  Blood pressure (!) 157/58, pulse (!) 50, temperature 97.8 F (36.6 C), temperature source Oral, resp. rate 16, height 5\' 4"  (1.626  m), weight 196 lb (88.9 kg), SpO2 97 %.  PHYSICAL EXAMINATION:  Physical Exam  GENERAL:  75 y.o.-year-old patient lying in the bed with no acute distress. Obese. EYES: Pupils equal, round, reactive to light and accommodation. No scleral icterus. Extraocular muscles intact.  HEENT: Head atraumatic, normocephalic. Oropharynx and nasopharynx clear.  NECK:  Supple, no jugular venous distention. No thyroid enlargement, no tenderness.  LUNGS: Normal breath sounds bilaterally, no wheezing, rales,rhonchi or crepitation. No use of accessory muscles of respiration.  CARDIOVASCULAR: S1, S2 normal. No murmurs, rubs, or gallops.  ABDOMEN: Soft, nontender, nondistended. Bowel sounds present. No organomegaly or mass.  EXTREMITIES: No pedal edema, cyanosis, or clubbing.  NEUROLOGIC: Cranial nerves II through XII are intact. Muscle strength 4/5 in all extremities. Sensation intact. Gait not checked.  PSYCHIATRIC: The patient is alert and oriented x 3.  SKIN: No obvious rash, lesion, or ulcer.   LABORATORY PANEL:   CBC  Recent Labs Lab 01/25/16 1401  WBC 6.9  HGB 12.2  HCT 36.4  PLT 217   ------------------------------------------------------------------------------------------------------------------  Chemistries   Recent Labs Lab 01/25/16 1401  NA  138  K 3.3*  CL 102  CO2 28  GLUCOSE 106*  BUN 17  CREATININE 0.79  CALCIUM 9.3   ------------------------------------------------------------------------------------------------------------------  Cardiac Enzymes No results for input(s): TROPONINI in the last 168 hours. ------------------------------------------------------------------------------------------------------------------  RADIOLOGY:  No results found.    IMPRESSION AND PLAN:   Multi-antibiotics resistant UTI The patient will be placed for observation. She is getting vancomycin IV, follow-up ID consult and possible PICC line tomorrow.  Dr. Ola Spurr will see the  patient.  Hypokalemia. Give potassium supplement, follow-up potassium and magnesium level. Hypertension. Continue home hypertension medication.   All the records are reviewed and case discussed with ED provider. Management plans discussed with the patient, family and they are in agreement.  CODE STATUS: Full code  TOTAL TIME TAKING CARE OF THIS PATIENT: 52 minutes.    Demetrios Loll M.D on 01/25/2016 at 5:58 PM  Between 7am to 6pm - Pager - [phone]  After 6pm go to [email or web address] - Proofreader  [facility]  Office  [phone]  CC: Primary care physician; [name], MD   Note: This dictation was prepared with Dragon dictation along with smaller phrase technology. Any transcriptional errors that result from this process are unintentional.

## 2016-01-25 NOTE — Progress Notes (Signed)
ID E note REviewed chart and cultures. She had R ureteral stent placed for infected R stone and found to have e coli bacteremia and uti.   She has had multiple cultures done including VRE (12/4), E coli 11/21 and now Vanc sensitive enterococcus.  She had most recent procedure on 12/11   And seems to still have a stent in place   Rec Place picc line Will need at least 14 days of IV abx.  WOuld suggest removal of stent if possible.

## 2016-01-25 NOTE — ED Provider Notes (Signed)
[facility] Emergency Department Provider Note  ____________________________________________  Time seen: Approximately 4:27 PM  I have reviewed the triage vital signs and the nursing notes.   HISTORY  Chief Complaint Urinary Tract Infection    HPI JANEL BEANE is a 75 y.o. female reports coming to the ED at the direction of her urology clinic due to multidrug resistant urinary tract infection. She was in the hospital 2 or 3 weeks ago with sepsis related to a kidney stone and urinary tract infection. She underwent cystoscopy and urinary stenting at that time. She was placed on antibiotics. However, culture result obtained about a week ago shows that the urinary tract infection is still present, and it is resistant to all oral antibiotics.  I reviewed the urine culture data, it shows multidrug resistant enterococcus, which is only susceptible to vancomycin.  Patient reports she is having more hematuria and dysuria as well as lower abdominal pain and back pain and getting dizzy when she stands up. She has generalized weakness as well. Denies syncope or falls. She has chills but no frank fever. No Vomiting or diarrhea. Tolerating oral intake.     Past Medical History:  Diagnosis Date  . Aromatase inhibitor use   . Breast cancer, left breast ([place]) 06/19/2014  . CHF (congestive heart failure) (HCC)    recent sepsis  . Chronic kidney disease   . Dizziness   . Dyspnea    recently while admitted  . GERD (gastroesophageal reflux disease)   . History of hiatal hernia   . History of kidney stones   . HOH (hard of hearing)    severe  . Hypertension   . Hypothyroidism   . Leg DVT (deep venous thromboembolism), acute ([place]) 07/2015   left leg after fracture  . 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 Pain    chest pain while admitted  . Thyroid disease      Patient Active Problem List   Diagnosis Date Noted  . Acute on chronic congestive heart failure ([place])  12/24/2015  . Sepsis ([place]) 12/19/2015  . 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 Fall 08/25/2015  . Left tibial fracture 08/25/2015  . Anxiety 08/25/2015  . GERD (gastroesophageal reflux disease) 08/25/2015  . HTN (hypertension) 08/25/2015  . Hypothyroidism 08/25/2015  . IDA (iron deficiency anemia) 06/19/2014  . Breast cancer, left breast ([place]) 06/19/2014  . 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     Past Surgical History:  Procedure Laterality Date  . ABDOMINAL HYSTERECTOMY     partial  . BREAST EXCISIONAL BIOPSY Left 12/2012   +  . BREAST LUMPECTOMY WITH SENTINEL LYMPH NODE BIOPSY Left 2014  . CHOLECYSTECTOMY    . CYSTOSCOPY W/ URETERAL STENT PLACEMENT Right 01/07/2016   Procedure: CYSTOSCOPY WITH STENT REPLACEMENT;  Surgeon: Hollice Espy, MD;  Location: [facility] OR[facility];  Service: Urology;  Laterality: Right;  . CYSTOSCOPY WITH RETROGRADE PYELOGRAM, URETEROSCOPY AND STENT PLACEMENT Right 12/19/2015   Procedure: CYSTOSCOPY WITH RETROGRADE PYELOGRAM, URETEROSCOPY AND STENT PLACEMENT;  Surgeon: Ardis Hughs, MD;  Location: [facility] ORS;  Service: Urology;  Laterality: Right;  . URETEROSCOPY WITH HOLMIUM LASER LITHOTRIPSY Right 01/07/2016   Procedure: URETEROSCOPY WITH HOLMIUM LASER LITHOTRIPSY;  Surgeon: Hollice Espy, MD;  Location: [facility] OR[facility];  Service: Urology;  Laterality: Right;     Prior to Admission medications   Medication Sig Start Date End Date Taking? Authorizing Provider  acetaminophen (TYLENOL) 500 MG tablet Take 500 mg by mouth every 6 (six) hours as needed (for  pain.).    Historical Provider, MD  anastrozole (ARIMIDEX) 1 MG tablet Take 1 tablet (1 mg total) by mouth daily. 11/06/15   [name], MD  aspirin EC 81 MG EC tablet Take 1 tablet (81 mg total) by mouth daily. 12/27/15   Demetrios Loll, MD  Camphor-Menthol-Capsicum (TIGER BALM PAIN RELIEVING) 80-24-16 MG PTCH Place 1 patch onto the skin daily as needed (for knee pain.).    Historical  Provider, MD  ergocalciferol (VITAMIN D2) 50000 units capsule Take 1 capsule (50,000 Units total) by mouth once a week. Patient taking differently: Take 50,000 Units by mouth every Monday.  11/06/15   [name], MD  furosemide (LASIX) 40 MG tablet Take 1 tablet (40 mg total) by mouth 2 (two) times daily. Patient taking differently: Take 40 mg by mouth daily.  12/26/15   Demetrios Loll, MD  labetalol (NORMODYNE) 300 MG tablet TAKE 1 TABLET (300 MG TOTAL) BY MOUTH 2 (TWO) TIMES DAILY. 08/09/14   Historical Provider, MD  levothyroxine (SYNTHROID) 88 MCG tablet Take 88 mcg by mouth daily before breakfast.  06/20/14   Historical Provider, MD  losartan-hydrochlorothiazide (HYZAAR) 100-25 MG tablet Take 1 tablet by mouth daily.    Historical Provider, MD  octreotide (SANDOSTATIN LAR) 30 MG injection Inject 30 mg into the muscle every 28 (twenty-eight) days.     Historical Provider, MD  potassium chloride SA (K-DUR,KLOR-CON) 20 MEQ tablet Once a day. 01/11/16   [name], MD     Allergies Sulfa antibiotics   Family History  Problem Relation Age of Onset  . Breast cancer Mother 9  . Breast cancer Maternal Aunt 68  . Prostate cancer Father     Social History Social History  Substance Use Topics  . Smoking status: Never Smoker  . Smokeless tobacco: Never Used  . Alcohol use No    Review of Systems  Constitutional:   No fever Positive chills.  ENT:   No sore throat. No rhinorrhea. Cardiovascular:   No chest pain. Respiratory:   No dyspnea or cough. Gastrointestinal:   Positive lower abdominal pain without vomiting and diarrhea.  Genitourinary:   Positive dysuria and hematuria. Musculoskeletal:   Negative for focal pain or swelling Neurological:   Negative for headaches 10-point ROS otherwise negative.  ____________________________________________   PHYSICAL EXAM:  VITAL SIGNS: ED Triage Vitals  Enc Vitals Group     BP 01/25/16 1355 (!) 115/50     Pulse Rate  01/25/16 1355 60     Resp 01/25/16 1355 16     Temp 01/25/16 1355 97.8 F (36.6 C)     Temp Source 01/25/16 1355 Oral     SpO2 01/25/16 1355 95 %     Weight 01/25/16 1353 196 lb (88.9 kg)     Height 01/25/16 1353 5\' 4"  (1.626 m)     Head Circumference --      Peak Flow --      Pain Score --      Pain Loc --      Pain Edu? --      Excl. in [place]? --     Vital signs reviewed, nursing assessments reviewed.   Constitutional:   Alert and oriented. Not in distress. Eyes:   No scleral icterus. No conjunctival pallor. PERRL. EOMI.  No nystagmus. ENT   Head:   Normocephalic and atraumatic.   Nose:   No congestion/rhinnorhea. No septal hematoma   Mouth/Throat:   MMM, no pharyngeal erythema. No peritonsillar mass.  Neck:   No stridor. No SubQ emphysema. No meningismus. Hematological/Lymphatic/Immunilogical:   No cervical lymphadenopathy. Cardiovascular:   RRR. Symmetric bilateral radial and DP pulses.  No murmurs.  Respiratory:   Normal respiratory effort without tachypnea nor retractions. Breath sounds are clear and equal bilaterally. No wheezes/rales/rhonchi. Gastrointestinal:   Soft with suprapubic tenderness. Non distended. Unclear CVA tenderness as patient reports there is pain bilaterally but downplays it..  No rebound, rigidity, or guarding. Genitourinary:   deferred Musculoskeletal:   Nontender with normal range of motion in all extremities. No joint effusions.  No lower extremity tenderness.  No edema. Neurologic:   Normal speech and language.  CN 2-10 normal. Motor grossly intact. No gross focal neurologic deficits are appreciated.  Skin:    Skin is warm, dry and intact. No rash noted.  No petechiae, purpura, or bullae.  ____________________________________________    LABS (pertinent positives/negatives) (all labs ordered are listed, but only abnormal results are displayed) Labs Reviewed  CBC WITH DIFFERENTIAL/PLATELET - Abnormal; Notable for the following:        Result Value   RDW 15.6 (*)    Lymphs Abs 0.6 (*)    All other components within normal limits  BASIC METABOLIC PANEL - Abnormal; Notable for the following:    Potassium 3.3 (*)    Glucose, Bld 106 (*)    All other components within normal limits   ____________________________________________   EKG    ____________________________________________    RADIOLOGY    ____________________________________________   PROCEDURES Procedures  ____________________________________________   INITIAL IMPRESSION / ASSESSMENT AND PLAN / ED COURSE  Pertinent labs & imaging results that were available during my care of the patient were reviewed by me and considered in my medical decision making (see chart for details).  Patient presents with signs and symptoms of cystitis. Vital signs unremarkable, but illness appears to be progressing and worsening. It is confirmed due to multidrug resistant enterococcus and only treatment option at present time appears to be IV vancomycin. I'll start this now. Urology paged to further discuss appropriate management, but I anticipate admission due to symptoms and complicated nature of disease.   ----------------------------------------- 5:02 PM on 01/25/2016 -----------------------------------------  Discussed with Dr. Erlene Quan. Agrees with hospitalization at this point given the just the difficulty in arranging PICC line and outpatient IV antibiotics over the weekend. Case d/w hospitalist for eval.    Clinical Course    ____________________________________________   FINAL CLINICAL IMPRESSION(S) / ED DIAGNOSES  Final diagnoses:  Cystitis  Infection due to multidrug resistant organism, newly diagnosed  Weakness      New Prescriptions   No medications on file     Portions of this note were generated with dragon dictation software. Dictation errors may occur despite best attempts at proofreading.    Carrie Mew, MD 01/25/16  [phone]

## 2016-01-25 NOTE — Telephone Encounter (Signed)
Patient's daughter [name] is calling wanting results of the urine culture. She states she has been waiting all week and her mother is bleeding again and is having pain. Please review the results and advise. [name] can be reached at [phone]

## 2016-01-25 NOTE — ED Triage Notes (Signed)
Patient to ED via POV, Pt was sent by her urologist, she was told that she had a very bad UTI and needed to have IV antibiotics. Patient states that she feels dizzy

## 2016-01-25 NOTE — Telephone Encounter (Signed)
I called the pt daughter to inform her of the positive urine culture and the need to start IV antibiotics. Per Dr. Pilar Jarvis the pt was recommended to go to the ER to get started on IV antibiotics.  The pt has a history of sepsis from a right mid ureteral stone. She complains of burning, hematuria which also could be associated w/ her Ureteral stent.

## 2016-01-26 MED ORDER — VANCOMYCIN HCL IN DEXTROSE 750-5 MG/150ML-% IV SOLN
750.0000 mg | Freq: Two times a day (BID) | INTRAVENOUS | Status: DC
  Administered 2016-01-26 – 2016-01-29 (×8): 750 mg via INTRAVENOUS
  Filled 2016-01-26 (×9): qty 150

## 2016-01-26 MED ORDER — FLUCONAZOLE 100 MG PO TABS
100.0000 mg | ORAL_TABLET | Freq: Once | ORAL | Status: AC
  Administered 2016-01-26: 100 mg via ORAL
  Filled 2016-01-26: qty 1

## 2016-01-26 NOTE — Progress Notes (Signed)
Pharmacy Antibiotic Note  Emma [name] is a 75 y.o. female admitted on 01/25/2016 with UTI.  Pharmacy has been consulted for vancomycin dosing.  Plan: DW 68kg  Vd 48L kei 0.06 hr-1  T1/2 12 hours Vancomycin 750 mg q 12 hours ordered with stacked dosing. Level before 5th dose. Goal trough 15-20 d/t bacteremia with previous UTI.  Height: 5\' 4"  (162.6 cm) Weight: 196 lb (88.9 kg) IBW/kg (Calculated) : 54.7  Temp (24hrs), Avg:97.8 F (36.6 C), Min:97.8 F (36.6 C), Max:97.8 F (36.6 C)   Recent Labs Lab 01/25/16 1401  WBC 6.9  CREATININE 0.79    Estimated Creatinine Clearance: 66.6 mL/min (by C-G formula based on SCr of 0.79 mg/dL).    Allergies  Allergen Reactions  . Sulfa Antibiotics     Other reaction(s): Kidney Disorder Other reaction(s): Kidney Disorder Other reaction(s): Kidney Disorder    Antimicrobials this admission: vancomycin 12/29 >>    >>   Dose adjustments this admission:   Microbiology results: 12/22  UCx: VSE     Thank you for allowing pharmacy to be a part of this patient's care.  Mikalah Skyles S 01/26/2016 12:31 AM

## 2016-01-26 NOTE — Progress Notes (Signed)
[place] at [facility]                                                                                                                                                                                  Patient Demographics   Emma [name], is a 75 y.o. female, DOB - December 05, 1940, DDU:[id number]  Admit date - 01/25/2016   Admitting Physician Demetrios Loll, MD  Outpatient Primary MD for the patient is [name], MD   LOS - 0  Subjective: Patient complains of some burning with urination and denies any other complaints. Awaiting PICC line placement    Review of Systems:   CONSTITUTIONAL: No documented fever. No fatigue, weakness. No weight gain, no weight loss.  EYES: No blurry or double vision.  ENT: No tinnitus. No postnasal drip. No redness of the oropharynx.  RESPIRATORY: No cough, no wheeze, no hemoptysis. No dyspnea.  CARDIOVASCULAR: No chest pain. No orthopnea. No palpitations. No syncope.  GASTROINTESTINAL: No nausea, no vomiting or diarrhea. No abdominal pain. No melena or hematochezia.  GENITOURINARY:Positive dysuria or no hematuria.  ENDOCRINE: No polyuria or nocturia. No heat or cold intolerance.  HEMATOLOGY: No anemia. No bruising. No bleeding.  INTEGUMENTARY: No rashes. No lesions.  MUSCULOSKELETAL: No arthritis. No swelling. No gout.  NEUROLOGIC: No numbness, tingling, or ataxia. No seizure-type activity.  PSYCHIATRIC: No anxiety. No insomnia. No ADD.    Vitals:   Vitals:   01/25/16 1800 01/25/16 1949 01/26/16 0421 01/26/16 0917  BP: (!) 172/70 (!) 168/66 (!) 148/58 137/73  Pulse: 60 66 64 69  Resp: 16 20 19 17   Temp:  97.8 F (36.6 C) 98 F (36.7 C) 98 F (36.7 C)  TempSrc:  Oral Oral Oral  SpO2: 100% 99% 97% 98%  Weight:      Height:        Wt Readings from Last 3 Encounters:  01/25/16 196 lb (88.9 kg)  01/17/16 192 lb 9.6 oz (87.4 kg)  01/11/16 192 lb (87.1 kg)     Intake/Output Summary (Last 24 hours) at 01/26/16  1355 Last data filed at 01/26/16 1339  Gross per 24 hour  Intake          1720.25 ml  Output              500 ml  Net          1220.25 ml    Physical Exam:   GENERAL: Pleasant-appearing in no apparent distress.  HEAD, EYES, EARS, NOSE AND THROAT: Atraumatic, normocephalic. Extraocular muscles are intact. Pupils equal and reactive to light. Sclerae anicteric. No conjunctival injection. No oro-pharyngeal erythema.  NECK: Supple. There is no jugular venous  distention. No bruits, no lymphadenopathy, no thyromegaly.  HEART: Regular rate and rhythm,. No murmurs, no rubs, no clicks.  LUNGS: Clear to auscultation bilaterally. No rales or rhonchi. No wheezes.  ABDOMEN: Soft, flat, nontender, nondistended. Has good bowel sounds. No hepatosplenomegaly appreciated.  EXTREMITIES: No evidence of any cyanosis, clubbing, or peripheral edema.  +2 pedal and radial pulses bilaterally.  NEUROLOGIC: The patient is alert, awake, and oriented x3 with no focal motor or sensory deficits appreciated bilaterally.  SKIN: Moist and warm with no rashes appreciated.  Psych: Not anxious, depressed LN: No inguinal LN enlargement    Antibiotics   Anti-infectives    Start     Dose/Rate Route Frequency Ordered Stop   01/26/16 1100  fluconazole (DIFLUCAN) tablet 100 mg     100 mg Oral  Once 01/26/16 1041     01/26/16 0600  vancomycin (VANCOCIN) IVPB 750 mg/150 ml premix     750 mg 150 mL/hr over 60 Minutes Intravenous Every 12 hours 01/26/16 0030     01/25/16 1700  vancomycin (VANCOCIN) 1,500 mg in sodium chloride 0.9 % 500 mL IVPB     1,500 mg 250 mL/hr over 120 Minutes Intravenous STAT 01/25/16 1656 01/25/16 1907   01/25/16 1630  vancomycin (VANCOCIN) injection 1,500 mg  Status:  Discontinued     1,500 mg Intravenous STAT 01/25/16 1624 01/25/16 1655      Medications   Scheduled Meds: . anastrozole  1 mg Oral Daily  . aspirin EC  81 mg Oral Daily  . enoxaparin (LOVENOX) injection  40 mg Subcutaneous Q24H   . fluconazole  100 mg Oral Once  . furosemide  40 mg Oral Daily  . losartan  100 mg Oral Daily   And  . hydrochlorothiazide  25 mg Oral Daily  . labetalol  300 mg Oral BID  . levothyroxine  88 mcg Oral QAC breakfast  . potassium chloride SA  20 mEq Oral Daily  . vancomycin  750 mg Intravenous Q12H   Continuous Infusions: . sodium chloride 75 mL/hr at 01/26/16 0630   PRN Meds:.acetaminophen **OR** acetaminophen, albuterol, HYDROcodone-acetaminophen, ondansetron **OR** ondansetron (ZOFRAN) IV   Data Review:   Micro Results Recent Results (from the past 240 hour(s))  Microscopic Examination     Status: Abnormal   Collection Time: 01/17/16  2:50 PM  Result Value Ref Range Status   WBC, UA >30 (A) 0 - 5 /hpf Final   RBC, UA >30 (A) 0 - 2 /hpf Final   Epithelial Cells (non renal) 0-10 0 - 10 /hpf Final   Casts Present (A) None seen /lpf Final   Cast Type Hyaline casts N/A Final   Bacteria, UA Few None seen/Few Final   Yeast, UA Present (A) None seen Final  CULTURE, URINE COMPREHENSIVE     Status: Abnormal   Collection Time: 01/18/16  3:33 PM  Result Value Ref Range Status   Urine Culture, Comprehensive Final report (A)  Final   Result 1 Enterococcus faecium (A)  Final    Comment: Greater than 100,000 colony forming units per mL Note: this isolate is vancomycin-susceptible. This information is provided for epidemiologic purposes only: vancomycin is not among the antibiotics recommended for therapy of urinary tract infections caused by Enterococcus.    Result 2 Yeast isolated. (A)  Final    Comment: Request for further identification must be made within 1 week. 10,000-25,000 colony forming units per mL    ANTIMICROBIAL SUSCEPTIBILITY Comment  Final    Comment:       **  S = Susceptible; I = Intermediate; R = Resistant **                    P = Positive; N = Negative             MICS are expressed in micrograms per mL    Antibiotic                 RSLT#1    RSLT#2    RSLT#3     RSLT#4 Ciprofloxacin                  R Levofloxacin                   R Nitrofurantoin                 R Penicillin                     R Tetracycline                   R Vancomycin                     S     Radiology Reports US Thyroid  Result Date: 01/12/2016 CLINICAL DATA:  Nodule EXAM: THYROID ULTRASOUND TECHNIQUE: Ultrasound examination of the thyroid gland and adjacent soft tissues was performed. COMPARISON:  12/06/2014 FINDINGS: Parenchymal Echotexture: Mildly heterogenous Estimated total number of nodules >/= 1 cm: 2 Number of spongiform nodules >/=  2 cm not described below (TR1): 0 Number of mixed cystic and solid nodules >/= 1.5 cm not described below ([place]): 0 _________________________________________________________ Isthmus: 0.9 cm thickness, stable Nodule # 1: Location: Isthmus; left of midline Size: 1.4 x 0.9 x 1 cm (previously 1 x 0.9 x 0.9 cm) Composition: solid/almost completely solid (2) Echogenicity: isoechoic (1) Shape: not taller-than-wide (0) Margins: ill-defined (0) Echogenic foci: none (0) ACR TI-RADS total points: 3. ACR TI-RADS risk category: TR3 (3 points). ACR TI-RADS recommendations: Given size (<1.4 cm) and appearance, this nodule does NOT meet TI-RADS criteria for biopsy or dedicated follow-up. _________________________________________________________ Right lobe: 3.8 x 2.1 x 1.9 cm (previously 4.3 x 2.1 x 2.5) Nodule # 1: Location: Right; Inferior Size: 2 x 2 x 1.8 cm (previously 2.3 x 2 x 2.1) Composition: solid/almost completely solid (2) Echogenicity: isoechoic (1) Shape: not taller-than-wide (0) Margins: ill-defined (0) Echogenic foci: none (0) ACR TI-RADS total points: 3. ACR TI-RADS risk category: TR3 (3 points). ACR TI-RADS recommendations: *Given size (>/= 1.5 - 2.4 cm) and appearance, a follow-up ultrasound in 1 year should be considered based on TI-RADS criteria. _________________________________________________________ Left lobe: 4.5 x 1.8 x 1.7 cm  (previously 4.1 x 1.9 x 1.3) No discrete nodules are identified within the left lobe of the thyroid. IMPRESSION: 1. No enlargement of dominant inferior right nodule. Recommend 1 year follow-up ultrasound. The above is in keeping with the ACR TI-RADS recommendations - J Am Coll Radiol 2017;14:587-595. Electronically Signed   By: Lucrezia Europe M.D.   On: 01/12/2016 11:46     CBC  Recent Labs Lab 01/25/16 1401  WBC 6.9  HGB 12.2  HCT 36.4  PLT 217  MCV 84.5  MCH 28.2  MCHC 33.4  RDW 15.6*  LYMPHSABS 0.6*  MONOABS 0.6  EOSABS 0.3  BASOSABS 0.1    Chemistries   Recent Labs Lab 01/25/16 1401  NA 138  K 3.3*  CL 102  CO2 28  GLUCOSE  106*  BUN 17  CREATININE 0.79  CALCIUM 9.3  MG 1.8   ------------------------------------------------------------------------------------------------------------------ estimated creatinine clearance is 66.6 mL/min (by C-G formula based on SCr of 0.79 mg/dL). ------------------------------------------------------------------------------------------------------------------ No results for input(s): HGBA1C in the last 72 hours. ------------------------------------------------------------------------------------------------------------------ No results for input(s): CHOL, HDL, LDLCALC, TRIG, CHOLHDL, LDLDIRECT in the last 72 hours. ------------------------------------------------------------------------------------------------------------------ No results for input(s): TSH, T4TOTAL, T3FREE, THYROIDAB in the last 72 hours.  Invalid input(s): FREET3 ------------------------------------------------------------------------------------------------------------------ No results for input(s): VITAMINB12, FOLATE, FERRITIN, TIBC, IRON, RETICCTPCT in the last 72 hours.  Coagulation profile No results for input(s): INR, PROTIME in the last 168 hours.  No results for input(s): DDIMER in the last 72 hours.  Cardiac Enzymes No results for input(s): CKMB,  TROPONINI, MYOGLOBIN in the last 168 hours.  Invalid input(s): CK ------------------------------------------------------------------------------------------------------------------ Invalid input(s): [place]   1. Multi-antibiotics resistant UTI: She has been seen by Dr. Ola Spurr who recommends IV vancomycin for 2 weeks, await PICC line placement case manager consult for home IV antibiotics  2. Hypokalemia. Being replaced . 3. Essential Hypertension. Continue losartan- hctz, , and labetolol  4. hypothyrdism continue synthroid     Code Status Orders        Start     Ordered   01/25/16 2004  Full code  Continuous     01/25/16 2003    Code Status History    Date Active Date Inactive Code Status Order ID Comments User Context   12/24/2015  8:20 PM 12/26/2015  8:46 PM Full Code [id number]  Lytle Butte, MD ED   12/19/2015  4:10 AM 12/21/2015  6:43 PM Full Code [id number]  Harrie Foreman, MD Inpatient   08/26/2015  1:23 AM 08/27/2015  8:31 PM Full Code [id number]  Lance Coon, MD ED           Consults  Infectious disease DVT Prophylaxis  Lovenox  Lab Results  Component Value Date   PLT 217 01/25/2016     Time Spent in minutes   25min  Greater than 50% of time spent in care coordination and counseling patient regarding the condition and plan of care.   Dustin Flock M.D on 01/26/2016 at 1:55 PM  Between 7am to 6pm - Pager - [phone]  After 6pm go to [email or web address] - password [email or web address] [place] [facility]   Office  [phone]

## 2016-01-27 ENCOUNTER — Inpatient Hospital Stay: Payer: Medicare Other

## 2016-01-27 DIAGNOSIS — H9190 Unspecified hearing loss, unspecified ear: Secondary | ICD-10-CM | POA: Diagnosis present

## 2016-01-27 DIAGNOSIS — I5031 Acute diastolic (congestive) heart failure: Secondary | ICD-10-CM | POA: Diagnosis present

## 2016-01-27 DIAGNOSIS — I5033 Acute on chronic diastolic (congestive) heart failure: Secondary | ICD-10-CM | POA: Diagnosis present

## 2016-01-27 DIAGNOSIS — B952 Enterococcus as the cause of diseases classified elsewhere: Secondary | ICD-10-CM | POA: Diagnosis present

## 2016-01-27 DIAGNOSIS — Z7982 Long term (current) use of aspirin: Secondary | ICD-10-CM | POA: Diagnosis not present

## 2016-01-27 DIAGNOSIS — Z85060 Personal history of malignant carcinoid tumor of small intestine: Secondary | ICD-10-CM | POA: Diagnosis not present

## 2016-01-27 DIAGNOSIS — Z1624 Resistance to multiple antibiotics: Secondary | ICD-10-CM | POA: Diagnosis present

## 2016-01-27 DIAGNOSIS — Z853 Personal history of malignant neoplasm of breast: Secondary | ICD-10-CM | POA: Diagnosis not present

## 2016-01-27 DIAGNOSIS — Z1621 Resistance to vancomycin: Secondary | ICD-10-CM | POA: Diagnosis present

## 2016-01-27 DIAGNOSIS — N189 Chronic kidney disease, unspecified: Secondary | ICD-10-CM | POA: Diagnosis present

## 2016-01-27 DIAGNOSIS — K219 Gastro-esophageal reflux disease without esophagitis: Secondary | ICD-10-CM | POA: Diagnosis present

## 2016-01-27 DIAGNOSIS — N390 Urinary tract infection, site not specified: Secondary | ICD-10-CM | POA: Diagnosis present

## 2016-01-27 DIAGNOSIS — Z803 Family history of malignant neoplasm of breast: Secondary | ICD-10-CM | POA: Diagnosis not present

## 2016-01-27 DIAGNOSIS — R531 Weakness: Secondary | ICD-10-CM | POA: Diagnosis present

## 2016-01-27 DIAGNOSIS — E876 Hypokalemia: Secondary | ICD-10-CM | POA: Diagnosis present

## 2016-01-27 DIAGNOSIS — I130 Hypertensive heart and chronic kidney disease with heart failure and stage 1 through stage 4 chronic kidney disease, or unspecified chronic kidney disease: Secondary | ICD-10-CM | POA: Diagnosis present

## 2016-01-27 DIAGNOSIS — E039 Hypothyroidism, unspecified: Secondary | ICD-10-CM | POA: Diagnosis present

## 2016-01-27 LAB — CREATININE, SERUM: CREATININE: 0.81 mg/dL (ref 0.44–1.00)

## 2016-01-27 LAB — VANCOMYCIN, TROUGH: VANCOMYCIN TR: 16 ug/mL (ref 15–20)

## 2016-01-27 NOTE — Progress Notes (Signed)
[place] at [facility]                                                                                                                                                                                  Patient Demographics   Emma [name], is a 75 y.o. female, DOB - [birth year], FGH:[id number]  Admit date - 01/25/2016   Admitting Physician Demetrios Loll, MD  Outpatient Primary MD for the patient is [name], MD   LOS - 0  Subjective: Place her headache denies any burning with urination  Review of Systems:   CONSTITUTIONAL: No documented fever. No fatigue, weakness. No weight gain, no weight loss.  EYES: No blurry or double vision.  ENT: No tinnitus. No postnasal drip. No redness of the oropharynx.  RESPIRATORY: No cough, no wheeze, no hemoptysis. No dyspnea.  CARDIOVASCULAR: No chest pain. No orthopnea. No palpitations. No syncope.  GASTROINTESTINAL: No nausea, no vomiting or diarrhea. No abdominal pain. No melena or hematochezia.  GENITOURINARY:Positive dysuria or no hematuria.  ENDOCRINE: No polyuria or nocturia. No heat or cold intolerance.  HEMATOLOGY: No anemia. No bruising. No bleeding.  INTEGUMENTARY: No rashes. No lesions.  MUSCULOSKELETAL: No arthritis. No swelling. No gout.  NEUROLOGIC: No numbness, tingling, or ataxia. No seizure-type activity. Positive for headache PSYCHIATRIC: No anxiety. No insomnia. No ADD.    Vitals:   Vitals:   01/26/16 2300 01/27/16 0442 01/27/16 0500 01/27/16 0801  BP: (!) 161/59 (!) 181/65 (!) 160/59 (!) 184/68  Pulse:  62  (!) 58  Resp:  (!) 22  20  Temp:  97.9 F (36.6 C)    TempSrc:  Oral    SpO2:  99%  96%  Weight:      Height:        Wt Readings from Last 3 Encounters:  01/25/16 196 lb (88.9 kg)  01/17/16 192 lb 9.6 oz (87.4 kg)  01/11/16 192 lb (87.1 kg)     Intake/Output Summary (Last 24 hours) at 01/27/16 1254 Last data filed at 01/27/16 1008  Gross per 24 hour  Intake          3020.25 ml   Output                0 ml  Net          3020.25 ml    Physical Exam:   GENERAL: Pleasant-appearing in no apparent distress.  HEAD, EYES, EARS, NOSE AND THROAT: Atraumatic, normocephalic. Extraocular muscles are intact. Pupils equal and reactive to light. Sclerae anicteric. No conjunctival injection. No oro-pharyngeal erythema.  NECK: Supple. There is no jugular venous distention. No bruits, no lymphadenopathy, no thyromegaly.  HEART:  Regular rate and rhythm,. No murmurs, no rubs, no clicks.  LUNGS: Clear to auscultation bilaterally. No rales or rhonchi. No wheezes.  ABDOMEN: Soft, flat, nontender, nondistended. Has good bowel sounds. No hepatosplenomegaly appreciated.  EXTREMITIES: No evidence of any cyanosis, clubbing, or peripheral edema.  +2 pedal and radial pulses bilaterally.  NEUROLOGIC: The patient is alert, awake, and oriented x3 with no focal motor or sensory deficits appreciated bilaterally.  SKIN: Moist and warm with no rashes appreciated.  Psych: Not anxious, depressed LN: No inguinal LN enlargement    Antibiotics   Anti-infectives    Start     Dose/Rate Route Frequency Ordered Stop   01/26/16 1100  fluconazole (DIFLUCAN) tablet 100 mg     100 mg Oral  Once 01/26/16 1041 01/26/16 1717   01/26/16 0600  vancomycin (VANCOCIN) IVPB 750 mg/150 ml premix     750 mg 150 mL/hr over 60 Minutes Intravenous Every 12 hours 01/26/16 0030     01/25/16 1700  vancomycin (VANCOCIN) 1,500 mg in sodium chloride 0.9 % 500 mL IVPB     1,500 mg 250 mL/hr over 120 Minutes Intravenous STAT 01/25/16 1656 01/25/16 1907   01/25/16 1630  vancomycin (VANCOCIN) injection 1,500 mg  Status:  Discontinued     1,500 mg Intravenous STAT 01/25/16 1624 01/25/16 1655      Medications   Scheduled Meds: . anastrozole  1 mg Oral Daily  . aspirin EC  81 mg Oral Daily  . enoxaparin (LOVENOX) injection  40 mg Subcutaneous Q24H  . furosemide  40 mg Oral Daily  . losartan  100 mg Oral Daily   And  .  hydrochlorothiazide  25 mg Oral Daily  . labetalol  300 mg Oral BID  . levothyroxine  88 mcg Oral QAC breakfast  . potassium chloride SA  20 mEq Oral Daily  . vancomycin  750 mg Intravenous Q12H   Continuous Infusions: . sodium chloride 75 mL/hr at 01/27/16 0504   PRN Meds:.acetaminophen **OR** acetaminophen, albuterol, HYDROcodone-acetaminophen, ondansetron **OR** ondansetron (ZOFRAN) IV   Data Review:   Micro Results Recent Results (from the past 240 hour(s))  Microscopic Examination     Status: Abnormal   Collection Time: 01/17/16  2:50 PM  Result Value Ref Range Status   WBC, UA >30 (A) 0 - 5 /hpf Final   RBC, UA >30 (A) 0 - 2 /hpf Final   Epithelial Cells (non renal) 0-10 0 - 10 /hpf Final   Casts Present (A) None seen /lpf Final   Cast Type Hyaline casts N/A Final   Bacteria, UA Few None seen/Few Final   Yeast, UA Present (A) None seen Final  CULTURE, URINE COMPREHENSIVE     Status: Abnormal   Collection Time: 01/18/16  3:33 PM  Result Value Ref Range Status   Urine Culture, Comprehensive Final report (A)  Final   Result 1 Enterococcus faecium (A)  Final    Comment: Greater than 100,000 colony forming units per mL Note: this isolate is vancomycin-susceptible. This information is provided for epidemiologic purposes only: vancomycin is not among the antibiotics recommended for therapy of urinary tract infections caused by Enterococcus.    Result 2 Yeast isolated. (A)  Final    Comment: Request for further identification must be made within 1 week. 10,000-25,000 colony forming units per mL    ANTIMICROBIAL SUSCEPTIBILITY Comment  Final    Comment:       ** S = Susceptible; I = Intermediate; R = Resistant **  P = Positive; N = Negative             MICS are expressed in micrograms per mL    Antibiotic                 RSLT#1    RSLT#2    RSLT#3    RSLT#4 Ciprofloxacin                  R Levofloxacin                   R Nitrofurantoin                  R Penicillin                     R Tetracycline                   R Vancomycin                     S     Radiology Reports US Thyroid  Result Date: 01/12/2016 CLINICAL DATA:  Nodule EXAM: THYROID ULTRASOUND TECHNIQUE: Ultrasound examination of the thyroid gland and adjacent soft tissues was performed. COMPARISON:  12/06/2014 FINDINGS: Parenchymal Echotexture: Mildly heterogenous Estimated total number of nodules >/= 1 cm: 2 Number of spongiform nodules >/=  2 cm not described below (TR1): 0 Number of mixed cystic and solid nodules >/= 1.5 cm not described below ([place]): 0 _________________________________________________________ Isthmus: 0.9 cm thickness, stable Nodule # 1: Location: Isthmus; left of midline Size: 1.4 x 0.9 x 1 cm (previously 1 x 0.9 x 0.9 cm) Composition: solid/almost completely solid (2) Echogenicity: isoechoic (1) Shape: not taller-than-wide (0) Margins: ill-defined (0) Echogenic foci: none (0) ACR TI-RADS total points: 3. ACR TI-RADS risk category: TR3 (3 points). ACR TI-RADS recommendations: Given size (<1.4 cm) and appearance, this nodule does NOT meet TI-RADS criteria for biopsy or dedicated follow-up. _________________________________________________________ Right lobe: 3.8 x 2.1 x 1.9 cm (previously 4.3 x 2.1 x 2.5) Nodule # 1: Location: Right; Inferior Size: 2 x 2 x 1.8 cm (previously 2.3 x 2 x 2.1) Composition: solid/almost completely solid (2) Echogenicity: isoechoic (1) Shape: not taller-than-wide (0) Margins: ill-defined (0) Echogenic foci: none (0) ACR TI-RADS total points: 3. ACR TI-RADS risk category: TR3 (3 points). ACR TI-RADS recommendations: *Given size (>/= 1.5 - 2.4 cm) and appearance, a follow-up ultrasound in 1 year should be considered based on TI-RADS criteria. _________________________________________________________ Left lobe: 4.5 x 1.8 x 1.7 cm (previously 4.1 x 1.9 x 1.3) No discrete nodules are identified within the left lobe of the thyroid. IMPRESSION: 1. No  enlargement of dominant inferior right nodule. Recommend 1 year follow-up ultrasound. The above is in keeping with the ACR TI-RADS recommendations - J Am Coll Radiol 2017;14:587-595. Electronically Signed   By: Lucrezia Europe M.D.   On: 01/12/2016 11:46     CBC  Recent Labs Lab 01/25/16 1401  WBC 6.9  HGB 12.2  HCT 36.4  PLT 217  MCV 84.5  MCH 28.2  MCHC 33.4  RDW 15.6*  LYMPHSABS 0.6*  MONOABS 0.6  EOSABS 0.3  BASOSABS 0.1    Chemistries   Recent Labs Lab 01/25/16 1401  NA 138  K 3.3*  CL 102  CO2 28  GLUCOSE 106*  BUN 17  CREATININE 0.79  CALCIUM 9.3  MG 1.8   ------------------------------------------------------------------------------------------------------------------ estimated creatinine clearance is 66.6 mL/min (by C-G formula based on SCr of  0.79 mg/dL). ------------------------------------------------------------------------------------------------------------------ No results for input(s): HGBA1C in the last 72 hours. ------------------------------------------------------------------------------------------------------------------ No results for input(s): CHOL, HDL, LDLCALC, TRIG, CHOLHDL, LDLDIRECT in the last 72 hours. ------------------------------------------------------------------------------------------------------------------ No results for input(s): TSH, T4TOTAL, T3FREE, THYROIDAB in the last 72 hours.  Invalid input(s): FREET3 ------------------------------------------------------------------------------------------------------------------ No results for input(s): VITAMINB12, FOLATE, FERRITIN, TIBC, IRON, RETICCTPCT in the last 72 hours.  Coagulation profile No results for input(s): INR, PROTIME in the last 168 hours.  No results for input(s): DDIMER in the last 72 hours.  Cardiac Enzymes No results for input(s): CKMB, TROPONINI, MYOGLOBIN in the last 168 hours.  Invalid input(s):  CK ------------------------------------------------------------------------------------------------------------------ Invalid input(s): [place]   1. Multi-antibiotics resistant UTI: IV vancomycin for total of 14 days home health is being arranged. For home IV antibiotics will ask PT evaluate the patient  2. Hypokalemia. Being replaced . 3. Essential Hypertension. Continue losartan- hctz, , and labetolol  4. hypothyrdism continue synthroid     Code Status Orders        Start     Ordered   01/25/16 2004  Full code  Continuous     01/25/16 2003    Code Status History    Date Active Date Inactive Code Status Order ID Comments User Context   12/24/2015  8:20 PM 12/26/2015  8:46 PM Full Code [id number]  Lytle Butte, MD ED   12/19/2015  4:10 AM 12/21/2015  6:43 PM Full Code [id number]  Harrie Foreman, MD Inpatient   08/26/2015  1:23 AM 08/27/2015  8:31 PM Full Code [id number]  Lance Coon, MD ED           Consults  Infectious disease DVT Prophylaxis  Lovenox  Lab Results  Component Value Date   PLT 217 01/25/2016     Time Spent in minutes   81min  Greater than 50% of time spent in care coordination and counseling patient regarding the condition and plan of care.   Dustin Flock M.D on 01/27/2016 at 12:54 PM  Between 7am to 6pm - Pager - [phone]  After 6pm go to [email or web address] - password [email or web address] [place] [facility] [facility]   Office  [phone]

## 2016-01-27 NOTE — Progress Notes (Signed)
Pharmacy Antibiotic Note  Emma [name] is a 75 y.o. female admitted on 01/25/2016 with UTI.  Pharmacy has been consulted for vancomycin dosing.  Plan: 12/31 Trough: 16     Goal trough 15-20 d/t bacteremia with previous UTI. Continue  Vancomycin 750 mg q 12 hours. Level before 5th dose.  Continue to monitor renal function and adjust dose as needed.   Height: 5\' 4"  (162.6 cm) Weight: 196 lb (88.9 kg) IBW/kg (Calculated) : 54.7  Temp (24hrs), Avg:98.1 F (36.7 C), Min:97.9 F (36.6 C), Max:98.3 F (36.8 C)   Recent Labs Lab 01/25/16 1401 01/27/16 1834  WBC 6.9  --   CREATININE 0.79 0.81  VANCOTROUGH  --  16    Estimated Creatinine Clearance: 65.8 mL/min (by C-G formula based on SCr of 0.81 mg/dL).    Allergies  Allergen Reactions  . Sulfa Antibiotics     Other reaction(s): Kidney Disorder Other reaction(s): Kidney Disorder Other reaction(s): Kidney Disorder    Antimicrobials this admission: vancomycin 12/29 >>    >>   Dose adjustments this admission:   Microbiology results: 12/22  UCx: VSE     Thank you for allowing pharmacy to be a part of this patient's care.  Pernell Dupre, PharmD, BCPS Clinical Pharmacist 01/27/2016 7:46 PM

## 2016-01-27 NOTE — Care Management Note (Signed)
Case Management Note  Patient Details  Name: Emma [name] MRN: [id number] Date of Birth: [birth year]  Subjective/Objective:      This CM faxed and called a referral for HH-RN and IV Vancomycin to [facility] today. No Case Management on 01/28/16 and the [facility] pharmacy will be closed on 01/28/16. Anticipate that [name] will discharge home on 01/29/16. [name] will be staying with her daughter [name] after discharge at [address], [place], [place] [address] ph: [phone].  [name] is currently open to [facility].              Action/Plan:   Expected Discharge Date:                  Expected Discharge Plan:     In-House Referral:     Discharge planning Services     Post Acute Care Choice:    Choice offered to:     DME Arranged:    DME Agency:     HH Arranged:    HH Agency:     Status of Service:     If discussed at [employer] of Stay Meetings, dates discussed:    Additional Comments:  Mariely Mahr A, RN 01/27/2016, 5:09 PM

## 2016-01-27 NOTE — Progress Notes (Signed)
Infectious Disease Long Term IV Antibiotic Orders  Diagnosis: VRE UTI  Culture results VRE  Allergies:  Allergies  Allergen Reactions  . Sulfa Antibiotics     Other reaction(s): Kidney Disorder Other reaction(s): Kidney Disorder Other reaction(s): Kidney Disorder    Discharge antibiotics Vancomycin          750        mg  every      12         hours .     Goal vancomycin trough 15-20.    Pharmacy to adjust dosing based on levels  PICC Care per protocol Labs weekly while on IV antibiotics      CBC w diff   Comprehensive met panel Vancomycin Trough      Planned duration of antibiotics 14 days  Stop date 02/07/16  Follow up clinic date TBD   FAX weekly labs to [phone]  [name], MD

## 2016-01-27 NOTE — Consult Note (Signed)
[facility] Infectious Disease     Reason for Consult: UTI   Referring Physician: Estanislado Spire Date of Admission:  01/25/2016   Active Problems:   UTI (urinary tract infection)   HPI: Emma [name] is a 75 y.o. female with a complicated hx of recent E coli bacteremia and UTI from infected stone. Underwent stent placement and then oral abx and then stent exchange and stone removal. She was admitted now after cx came back with resistant enterococcus. SHe is very hard of hearing and is a poor historian.   Past Medical History:  Diagnosis Date  . Aromatase inhibitor use   . Breast cancer, left breast ([place]) 06/19/2014  . CHF (congestive heart failure) ([facility])    recent sepsis  . Chronic kidney disease   . Dizziness   . Dyspnea    recently while admitted  . GERD (gastroesophageal reflux disease)   . History of hiatal hernia   . History of kidney stones   . HOH (hard of hearing)    severe  . Hypertension   . Hypothyroidism   . Leg DVT (deep venous thromboembolism), acute ([place]) 07/2015   left leg after fracture  . 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 Pain    chest pain while admitted  . Thyroid disease    Past Surgical History:  Procedure Laterality Date  . ABDOMINAL HYSTERECTOMY     partial  . BREAST EXCISIONAL BIOPSY Left 12/2012   +  . BREAST LUMPECTOMY WITH SENTINEL LYMPH NODE BIOPSY Left 2014  . CHOLECYSTECTOMY    . CYSTOSCOPY W/ URETERAL STENT PLACEMENT Right 01/07/2016   Procedure: CYSTOSCOPY WITH STENT REPLACEMENT;  Surgeon: Hollice Espy, MD;  Location: [facility] ORS;  Service: Urology;  Laterality: Right;  . CYSTOSCOPY WITH RETROGRADE PYELOGRAM, URETEROSCOPY AND STENT PLACEMENT Right 12/19/2015   Procedure: CYSTOSCOPY WITH RETROGRADE PYELOGRAM, URETEROSCOPY AND STENT PLACEMENT;  Surgeon: Ardis Hughs, MD;  Location: [facility] ORS;  Service: Urology;  Laterality: Right;  . URETEROSCOPY WITH HOLMIUM LASER LITHOTRIPSY Right 01/07/2016   Procedure: URETEROSCOPY WITH HOLMIUM LASER  LITHOTRIPSY;  Surgeon: Hollice Espy, MD;  Location: [facility] ORS;  Service: Urology;  Laterality: Right;   Social History  Substance Use Topics  . Smoking status: Never Smoker  . Smokeless tobacco: Never Used  . Alcohol use No   Family History  Problem Relation Age of Onset  . Breast cancer Mother 52  . Breast cancer Maternal Aunt 68  . Prostate cancer Father     Allergies:  Allergies  Allergen Reactions  . Sulfa Antibiotics     Other reaction(s): Kidney Disorder Other reaction(s): Kidney Disorder Other reaction(s): Kidney Disorder    Current antibiotics: Antibiotics Given (last 72 hours)    Date/Time Action Medication Dose Rate   01/26/16 0523 Given   vancomycin (VANCOCIN) IVPB 750 mg/150 ml premix 750 mg 150 mL/hr   01/26/16 1717 Given   vancomycin (VANCOCIN) IVPB 750 mg/150 ml premix 750 mg 150 mL/hr   01/27/16 0504 Given   vancomycin (VANCOCIN) IVPB 750 mg/150 ml premix 750 mg 150 mL/hr      MEDICATIONS: . anastrozole  1 mg Oral Daily  . aspirin EC  81 mg Oral Daily  . enoxaparin (LOVENOX) injection  40 mg Subcutaneous Q24H  . furosemide  40 mg Oral Daily  . losartan  100 mg Oral Daily   And  . hydrochlorothiazide  25 mg Oral Daily  . labetalol  300 mg Oral BID  . levothyroxine  88 mcg Oral QAC breakfast  .  potassium chloride SA  20 mEq Oral Daily  . vancomycin  750 mg Intravenous Q12H    Review of Systems - 11 systems reviewed and negative per HPI   OBJECTIVE: Temp:  [97.9 F (36.6 C)-98 F (36.7 C)] 97.9 F (36.6 C) (12/31 0442) Pulse Rate:  [58-74] 58 (12/31 0801) Resp:  [20-22] 20 (12/31 0801) BP: (160-188)/(59-73) 184/68 (12/31 0801) SpO2:  [96 %-99 %] 96 % (12/31 0801) Physical Exam  Constitutional:  oriented to person, place, and time. appears HOH obese  HENT: [place]/AT, PERRLA, no scleral icterus Mouth/Throat: Oropharynx is clear and moist. No oropharyngeal exudate.  Cardiovascular: Normal rate, regular rhythm and normal heart sounds. Exam  reveals no gallop and no friction rub.  No murmur heard.  Pulmonary/Chest: Effort normal and breath sounds normal. No respiratory distress.  has no wheezes.  Neck = supple, no nuchal rigidity Abdominal: Soft. Bowel sounds are normal.  exhibits no distension. There is no tenderness.  Lymphadenopathy: no cervical adenopathy. No axillary adenopathy Neurological: alert and oriented to person, place, and time.  Skin: Skin is warm and dry. No rash noted. No erythema.  Psychiatric: a normal mood and affect.  behavior is normal.   LABS: Results for orders placed or performed during the hospital encounter of 01/25/16 (from the past 48 hour(s))  CBC with Differential     Status: Abnormal   Collection Time: 01/25/16  2:01 PM  Result Value Ref Range   WBC 6.9 3.6 - 11.0 K/uL   RBC 4.31 3.80 - 5.20 MIL/uL   Hemoglobin 12.2 12.0 - 16.0 g/dL   HCT 36.4 35.0 - 47.0 %   MCV 84.5 80.0 - 100.0 fL   MCH 28.2 26.0 - 34.0 pg   MCHC 33.4 32.0 - 36.0 g/dL   RDW 15.6 (H) 11.5 - 14.5 %   Platelets 217 150 - 440 K/uL   Neutrophils Relative % 76 %   Neutro Abs 5.3 1.4 - 6.5 K/uL   Lymphocytes Relative 9 %   Lymphs Abs 0.6 (L) 1.0 - 3.6 K/uL   Monocytes Relative 9 %   Monocytes Absolute 0.6 0.2 - 0.9 K/uL   Eosinophils Relative 5 %   Eosinophils Absolute 0.3 0 - 0.7 K/uL   Basophils Relative 1 %   Basophils Absolute 0.1 0 - 0.1 K/uL  Basic metabolic panel     Status: Abnormal   Collection Time: 01/25/16  2:01 PM  Result Value Ref Range   Sodium 138 135 - 145 mmol/L   Potassium 3.3 (L) 3.5 - 5.1 mmol/L   Chloride 102 101 - 111 mmol/L   CO2 28 22 - 32 mmol/L   Glucose, Bld 106 (H) 65 - 99 mg/dL   BUN 17 6 - 20 mg/dL   Creatinine, Ser 0.79 0.44 - 1.00 mg/dL   Calcium 9.3 8.9 - 10.3 mg/dL   GFR calc non Af Amer >60 >60 mL/min   GFR calc Af Amer >60 >60 mL/min    Comment: (NOTE) The eGFR has been calculated using the CKD EPI equation. This calculation has not been validated in all clinical  situations. eGFR's persistently <60 mL/min signify possible Chronic Kidney Disease.    Anion gap 8 5 - 15  Magnesium     Status: None   Collection Time: 01/25/16  2:01 PM  Result Value Ref Range   Magnesium 1.8 1.7 - 2.4 mg/dL   No components found for: ESR, C REACTIVE PROTEIN MICRO: Recent Results (from the past 720 hour(s))  CULTURE,  URINE COMPREHENSIVE     Status: Abnormal   Collection Time: 12/31/15  3:50 PM  Result Value Ref Range Status   Urine Culture, Comprehensive Final report (A)  Final   Result 1 Comment (A)  Final    Comment: Vancomycin-resistant Enterococcus (Enterococcus faecium) 10,000-25,000 colony forming units per mL    ANTIMICROBIAL SUSCEPTIBILITY Comment  Final    Comment:       ** S = Susceptible; I = Intermediate; R = Resistant **                    P = Positive; N = Negative             MICS are expressed in micrograms per mL    Antibiotic                 RSLT#1    RSLT#2    RSLT#3    RSLT#4 Ciprofloxacin                  R Levofloxacin                   R Nitrofurantoin                 R Penicillin                     R Tetracycline                   R Vancomycin                     R   Microscopic Examination     Status: Abnormal   Collection Time: 12/31/15  3:50 PM  Result Value Ref Range Status   WBC, UA 11-30 (A) 0 - 5 /hpf Final   RBC, UA >30 (A) 0 - 2 /hpf Final   Epithelial Cells (non renal) 0-10 0 - 10 /hpf Final   Crystals Present (A) N/A Final   Crystal Type Calcium Oxalate N/A Final   Bacteria, UA Few None seen/Few Final  Microscopic Examination     Status: Abnormal   Collection Time: 01/17/16  2:50 PM  Result Value Ref Range Status   WBC, UA >30 (A) 0 - 5 /hpf Final   RBC, UA >30 (A) 0 - 2 /hpf Final   Epithelial Cells (non renal) 0-10 0 - 10 /hpf Final   Casts Present (A) None seen /lpf Final   Cast Type Hyaline casts N/A Final   Bacteria, UA Few None seen/Few Final   Yeast, UA Present (A) None seen Final  CULTURE, URINE  COMPREHENSIVE     Status: Abnormal   Collection Time: 01/18/16  3:33 PM  Result Value Ref Range Status   Urine Culture, Comprehensive Final report (A)  Final   Result 1 Enterococcus faecium (A)  Final    Comment: Greater than 100,000 colony forming units per mL Note: this isolate is vancomycin-susceptible. This information is provided for epidemiologic purposes only: vancomycin is not among the antibiotics recommended for therapy of urinary tract infections caused by Enterococcus.    Result 2 Yeast isolated. (A)  Final    Comment: Request for further identification must be made within 1 week. 10,000-25,000 colony forming units per mL    ANTIMICROBIAL SUSCEPTIBILITY Comment  Final    Comment:       ** S = Susceptible; I = Intermediate; R = Resistant **  P = Positive; N = Negative             MICS are expressed in micrograms per mL    Antibiotic                 RSLT#1    RSLT#2    RSLT#3    RSLT#4 Ciprofloxacin                  R Levofloxacin                   R Nitrofurantoin                 R Penicillin                     R Tetracycline                   R Vancomycin                     S     IMAGING: US Thyroid  Result Date: 01/12/2016 CLINICAL DATA:  Nodule EXAM: THYROID ULTRASOUND TECHNIQUE: Ultrasound examination of the thyroid gland and adjacent soft tissues was performed. COMPARISON:  12/06/2014 FINDINGS: Parenchymal Echotexture: Mildly heterogenous Estimated total number of nodules >/= 1 cm: 2 Number of spongiform nodules >/=  2 cm not described below (TR1): 0 Number of mixed cystic and solid nodules >/= 1.5 cm not described below ([place]): 0 _________________________________________________________ Isthmus: 0.9 cm thickness, stable Nodule # 1: Location: Isthmus; left of midline Size: 1.4 x 0.9 x 1 cm (previously 1 x 0.9 x 0.9 cm) Composition: solid/almost completely solid (2) Echogenicity: isoechoic (1) Shape: not taller-than-wide (0) Margins: ill-defined (0)  Echogenic foci: none (0) ACR TI-RADS total points: 3. ACR TI-RADS risk category: TR3 (3 points). ACR TI-RADS recommendations: Given size (<1.4 cm) and appearance, this nodule does NOT meet TI-RADS criteria for biopsy or dedicated follow-up. _________________________________________________________ Right lobe: 3.8 x 2.1 x 1.9 cm (previously 4.3 x 2.1 x 2.5) Nodule # 1: Location: Right; Inferior Size: 2 x 2 x 1.8 cm (previously 2.3 x 2 x 2.1) Composition: solid/almost completely solid (2) Echogenicity: isoechoic (1) Shape: not taller-than-wide (0) Margins: ill-defined (0) Echogenic foci: none (0) ACR TI-RADS total points: 3. ACR TI-RADS risk category: TR3 (3 points). ACR TI-RADS recommendations: *Given size (>/= 1.5 - 2.4 cm) and appearance, a follow-up ultrasound in 1 year should be considered based on TI-RADS criteria. _________________________________________________________ Left lobe: 4.5 x 1.8 x 1.7 cm (previously 4.1 x 1.9 x 1.3) No discrete nodules are identified within the left lobe of the thyroid. IMPRESSION: 1. No enlargement of dominant inferior right nodule. Recommend 1 year follow-up ultrasound. The above is in keeping with the ACR TI-RADS recommendations - J Am Coll Radiol 2017;14:587-595. Electronically Signed   By: Lucrezia Europe M.D.   On: 01/12/2016 11:46    Assessment:   NAJLA AUGHENBAUGH is a 75 y.o. female with hx recurrent UTIs, admitted when otpt Cx done at urology grew resistant Enterococcus. She recently had R ureteral stent placed for infected R stone and at that time was  found to have e coli bacteremia and uti.   She has had multiple cultures done including VRE (12/4), E coli 11/21 and now Vanc sensitive enterococcus. She had most recent procedure on 12/11   And seems to still have a stent in place Recommendations She will need at least 14 days of IV abx.  Would  suggest removal of stent if possible.   She should be seen by urology as otpt to see if this is possible.  I can see in 10 -14  days - I have completed IV abx order sheet as well.   Thank you very much for allowing me to participate in the care of this patient. Please call with questions.   Cheral Marker. Ola Spurr, MD

## 2016-01-27 NOTE — Progress Notes (Signed)
Physical Therapy Evaluation Patient Details Name: Emma [name] MRN: [id number] DOB: April 10, 1940 Today's Date: 01/27/2016   History of Present Illness  Patient is a 75 y.o. female admitted on 29 DEC with a UTI. Was admitted two weeks ago with sepsis and a kidney stone. PMH includes nephrolithiasis s/p stent, HTN, CHF, Breast CA, and L tibia fx July 2017.  Clinical Impression  Patient is a pleasant 76 y.o. Female admitted multiple times in recent past with UTIs. Patient lives at home with daughter and son in [place]. Patient previously modified independent with household ambulation with no recent falls. On evaluation, patient demonstrates baseline level of mobility with HR/oxygen saturations remaining WNL. Patient complained of bilateral knee pain, and PT educated patient about strengthening/stretching exercises to perform. It is believed that patient has the necessary assistance/equipment upon discharge when medically ready.    Follow Up Recommendations No PT follow up    Equipment Recommendations  None recommended by PT    Recommendations for Other Services       Precautions / Restrictions Precautions Precautions: Fall Restrictions Weight Bearing Restrictions: No      Mobility  Bed Mobility Overal bed mobility: Independent             General bed mobility comments: Patient is independent with bed mobility.  Transfers Overall transfer level: Independent Equipment used: Rolling walker (2 wheeled)             General transfer comment: Patient moves from sit to stand independently with good safety awareness.  Ambulation/Gait Ambulation/Gait assistance: Min guard Ambulation Distance (Feet): 30 Feet Assistive device: Rolling walker (2 wheeled)       General Gait Details: Patient ambulates at decreased cadence with RW. No LOB with HR/oxygen saturations WNL.  Stairs            Wheelchair Mobility    Modified Rankin (Stroke Patients Only)       Balance  Overall balance assessment: Modified Independent                                           Pertinent Vitals/Pain Pain Assessment: No/denies pain    Home Living Family/patient expects to be discharged to:: Private residence Living Arrangements: Children Available Help at Discharge: Family;Available PRN/intermittently Type of Home: House Home Access: Level entry     Home Layout: One level Home Equipment: Walker - 2 wheels;Shower seat      Prior Function Level of Independence: Needs assistance   Gait / Transfers Assistance Needed: Modified independent with RW for household distances  ADL's / Homemaking Assistance Needed: Performed by daughter        Hand Dominance        Extremity/Trunk Assessment   Upper Extremity Assessment Upper Extremity Assessment: Overall WFL for tasks assessed    Lower Extremity Assessment Lower Extremity Assessment: Generalized weakness;Overall WFL for tasks assessed (Pain in bilateral knees)       Communication   Communication: HOH  Cognition Arousal/Alertness: Awake/alert Behavior During Therapy: WFL for tasks assessed/performed Overall Cognitive Status: Within Functional Limits for tasks assessed                      General Comments      Exercises     Assessment/Plan    PT Assessment Patent does not need any further PT services  PT Problem List  PT Treatment Interventions      PT Goals (Current goals can be found in the Care Plan section)  Acute Rehab PT Goals Patient Stated Goal: "To feel better" PT Goal Formulation: With patient Time For Goal Achievement: 02/10/16 Potential to Achieve Goals: Good    Frequency     Barriers to discharge        Co-evaluation               End of Session Equipment Utilized During Treatment: Gait belt Activity Tolerance: Patient tolerated treatment well Patient left: in bed;with call bell/phone within reach;with bed alarm set Nurse  Communication: Mobility status         Time: 1440-1502 PT Time Calculation (min) (ACUTE ONLY): 22 min   Charges:   PT Evaluation $PT Eval Low Complexity: 1 Procedure     PT G Codes:        Dorice Lamas, PT, DPT 01/27/2016, 3:10 PM

## 2016-01-27 NOTE — Progress Notes (Signed)
Dr. Posey Pronto was paged to get a chest -x-ray order for PICC line placement that was done previous shift. Awaiting for response.

## 2016-01-28 MED ORDER — FUROSEMIDE 10 MG/ML IJ SOLN
40.0000 mg | Freq: Once | INTRAMUSCULAR | Status: AC
  Administered 2016-01-28: 40 mg via INTRAVENOUS
  Filled 2016-01-28: qty 4

## 2016-01-28 NOTE — Care Management (Signed)
Confirmed with [facility] that pharmacy would be able to complete the processing of script for bid IV Vancomycin.  PICC line in place. Daughter to assume responsibility for administration per report

## 2016-01-28 NOTE — Progress Notes (Signed)
[place] [facility]                                                                                                                                                                                  Patient Demographics   Emma [name], is a 76 y.o. female, DOB - [birth year], QVZ:[id number]  Admit date - 01/25/2016   Admitting Physician Demetrios Loll, MD  Outpatient Primary MD for the patient is [name], MD   LOS - 1  Subjective:  c/o some sob no cp  Review of Systems:   CONSTITUTIONAL: No documented fever. No fatigue, weakness. No weight gain, no weight loss.  EYES: No blurry or double vision.  ENT: No tinnitus. No postnasal drip. No redness of the oropharynx.  RESPIRATORY: No cough, no wheeze, no hemoptysis. + dyspnea.  CARDIOVASCULAR: No chest pain. No orthopnea. No palpitations. No syncope.  GASTROINTESTINAL: No nausea, no vomiting or diarrhea. No abdominal pain. No melena or hematochezia.  GENITOURINARY:Positive dysuria or no hematuria.  ENDOCRINE: No polyuria or nocturia. No heat or cold intolerance.  HEMATOLOGY: No anemia. No bruising. No bleeding.  INTEGUMENTARY: No rashes. No lesions.  MUSCULOSKELETAL: No arthritis. No swelling. No gout.  NEUROLOGIC: No numbness, tingling, or ataxia. No seizure-type activity. Positive for headache PSYCHIATRIC: No anxiety. No insomnia. No ADD.    Vitals:   Vitals:   01/27/16 1957 01/28/16 0419 01/28/16 0817 01/28/16 1248  BP: (!) 175/63 (!) 188/63 (!) 189/61 137/64  Pulse: 60 (!) 57 (!) 59 (!) 54  Resp: 18 17 16 12   Temp: 97.8 F (36.6 C) 97.6 F (36.4 C) 97.6 F (36.4 C) 97.5 F (36.4 C)  TempSrc: Oral Oral Oral Oral  SpO2: 97% 97% 100% 97%  Weight:      Height:        Wt Readings from Last 3 Encounters:  01/25/16 196 lb (88.9 kg)  01/17/16 192 lb 9.6 oz (87.4 kg)  01/11/16 192 lb (87.1 kg)     Intake/Output Summary (Last 24 hours) at 01/28/16 1518 Last data filed at 01/28/16 1300   Gross per 24 hour  Intake             3175 ml  Output                0 ml  Net             3175 ml    Physical Exam:   GENERAL: Pleasant-appearing in no apparent distress.  HEAD, EYES, EARS, NOSE AND THROAT: Atraumatic, normocephalic. Extraocular muscles are intact. Pupils equal and reactive to light. Sclerae anicteric. No conjunctival injection. No oro-pharyngeal erythema.  NECK: Supple. There  is no jugular venous distention. No bruits, no lymphadenopathy, no thyromegaly.  HEART: Regular rate and rhythm,. No murmurs, no rubs, no clicks.  LUNGS: cracles bilaterally. No rales or rhonchi. No wheezes.  ABDOMEN: Soft, flat, nontender, nondistended. Has good bowel sounds. No hepatosplenomegaly appreciated.  EXTREMITIES: No evidence of any cyanosis, clubbing, or peripheral edema.  +2 pedal and radial pulses bilaterally.  NEUROLOGIC: The patient is alert, awake, and oriented x3 with no focal motor or sensory deficits appreciated bilaterally.  SKIN: Moist and warm with no rashes appreciated.  Psych: Not anxious, depressed LN: No inguinal LN enlargement    Antibiotics   Anti-infectives    Start     Dose/Rate Route Frequency Ordered Stop   01/26/16 1100  fluconazole (DIFLUCAN) tablet 100 mg     100 mg Oral  Once 01/26/16 1041 01/26/16 1717   01/26/16 0600  vancomycin (VANCOCIN) IVPB 750 mg/150 ml premix     750 mg 150 mL/hr over 60 Minutes Intravenous Every 12 hours 01/26/16 0030     01/25/16 1700  vancomycin (VANCOCIN) 1,500 mg in sodium chloride 0.9 % 500 mL IVPB     1,500 mg 250 mL/hr over 120 Minutes Intravenous STAT 01/25/16 1656 01/25/16 1907   01/25/16 1630  vancomycin (VANCOCIN) injection 1,500 mg  Status:  Discontinued     1,500 mg Intravenous STAT 01/25/16 1624 01/25/16 1655      Medications   Scheduled Meds: . anastrozole  1 mg Oral Daily  . aspirin EC  81 mg Oral Daily  . enoxaparin (LOVENOX) injection  40 mg Subcutaneous Q24H  . furosemide  40 mg Oral Daily  . losartan   100 mg Oral Daily   And  . hydrochlorothiazide  25 mg Oral Daily  . labetalol  300 mg Oral BID  . levothyroxine  88 mcg Oral QAC breakfast  . potassium chloride SA  20 mEq Oral Daily  . vancomycin  750 mg Intravenous Q12H   Continuous Infusions:  PRN Meds:.acetaminophen **OR** acetaminophen, albuterol, HYDROcodone-acetaminophen, ondansetron **OR** ondansetron (ZOFRAN) IV   Data Review:   Micro Results Recent Results (from the past 240 hour(s))  CULTURE, URINE COMPREHENSIVE     Status: Abnormal   Collection Time: 01/18/16  3:33 PM  Result Value Ref Range Status   Urine Culture, Comprehensive Final report (A)  Final   Result 1 Enterococcus faecium (A)  Final    Comment: Greater than 100,000 colony forming units per mL Note: this isolate is vancomycin-susceptible. This information is provided for epidemiologic purposes only: vancomycin is not among the antibiotics recommended for therapy of urinary tract infections caused by Enterococcus.    Result 2 Yeast isolated. (A)  Final    Comment: Request for further identification must be made within 1 week. 10,000-25,000 colony forming units per mL    ANTIMICROBIAL SUSCEPTIBILITY Comment  Final    Comment:       ** S = Susceptible; I = Intermediate; R = Resistant **                    P = Positive; N = Negative             MICS are expressed in micrograms per mL    Antibiotic                 RSLT#1    RSLT#2    RSLT#3    RSLT#4 Ciprofloxacin  R Levofloxacin                   R Nitrofurantoin                 R Penicillin                     R Tetracycline                   R Vancomycin                     S     Radiology Reports Dg Chest Port 1 View  Result Date: 01/27/2016 CLINICAL DATA:  Central line placement. EXAM: PORTABLE CHEST 1 VIEW COMPARISON:  01/23/2016 FINDINGS: RIGHT PICC line with tip in distal SVC. Normal cardiac silhouette. No effusion, infiltrate, pneumothorax. IMPRESSION: RIGHT PICC line with  tip in distal SVC. Electronically Signed   By: Suzy Bouchard M.D.   On: 01/27/2016 20:39   US Thyroid  Result Date: 01/12/2016 CLINICAL DATA:  Nodule EXAM: THYROID ULTRASOUND TECHNIQUE: Ultrasound examination of the thyroid gland and adjacent soft tissues was performed. COMPARISON:  12/06/2014 FINDINGS: Parenchymal Echotexture: Mildly heterogenous Estimated total number of nodules >/= 1 cm: 2 Number of spongiform nodules >/=  2 cm not described below (TR1): 0 Number of mixed cystic and solid nodules >/= 1.5 cm not described below ([place]): 0 _________________________________________________________ Isthmus: 0.9 cm thickness, stable Nodule # 1: Location: Isthmus; left of midline Size: 1.4 x 0.9 x 1 cm (previously 1 x 0.9 x 0.9 cm) Composition: solid/almost completely solid (2) Echogenicity: isoechoic (1) Shape: not taller-than-wide (0) Margins: ill-defined (0) Echogenic foci: none (0) ACR TI-RADS total points: 3. ACR TI-RADS risk category: TR3 (3 points). ACR TI-RADS recommendations: Given size (<1.4 cm) and appearance, this nodule does NOT meet TI-RADS criteria for biopsy or dedicated follow-up. _________________________________________________________ Right lobe: 3.8 x 2.1 x 1.9 cm (previously 4.3 x 2.1 x 2.5) Nodule # 1: Location: Right; Inferior Size: 2 x 2 x 1.8 cm (previously 2.3 x 2 x 2.1) Composition: solid/almost completely solid (2) Echogenicity: isoechoic (1) Shape: not taller-than-wide (0) Margins: ill-defined (0) Echogenic foci: none (0) ACR TI-RADS total points: 3. ACR TI-RADS risk category: TR3 (3 points). ACR TI-RADS recommendations: *Given size (>/= 1.5 - 2.4 cm) and appearance, a follow-up ultrasound in 1 year should be considered based on TI-RADS criteria. _________________________________________________________ Left lobe: 4.5 x 1.8 x 1.7 cm (previously 4.1 x 1.9 x 1.3) No discrete nodules are identified within the left lobe of the thyroid. IMPRESSION: 1. No enlargement of dominant inferior  right nodule. Recommend 1 year follow-up ultrasound. The above is in keeping with the ACR TI-RADS recommendations - J Am Coll Radiol 2017;14:587-595. Electronically Signed   By: Lucrezia Europe M.D.   On: 01/12/2016 11:46     CBC  Recent Labs Lab 01/25/16 1401  WBC 6.9  HGB 12.2  HCT 36.4  PLT 217  MCV 84.5  MCH 28.2  MCHC 33.4  RDW 15.6*  LYMPHSABS 0.6*  MONOABS 0.6  EOSABS 0.3  BASOSABS 0.1    Chemistries   Recent Labs Lab 01/25/16 1401 01/27/16 1834  NA 138  --   K 3.3*  --   CL 102  --   CO2 28  --   GLUCOSE 106*  --   BUN 17  --   CREATININE 0.79 0.81  CALCIUM 9.3  --   MG 1.8  --    ------------------------------------------------------------------------------------------------------------------  estimated creatinine clearance is 65.8 mL/min (by C-G formula based on SCr of 0.81 mg/dL). ------------------------------------------------------------------------------------------------------------------ No results for input(s): HGBA1C in the last 72 hours. ------------------------------------------------------------------------------------------------------------------ No results for input(s): CHOL, HDL, LDLCALC, TRIG, CHOLHDL, LDLDIRECT in the last 72 hours. ------------------------------------------------------------------------------------------------------------------ No results for input(s): TSH, T4TOTAL, T3FREE, THYROIDAB in the last 72 hours.  Invalid input(s): FREET3 ------------------------------------------------------------------------------------------------------------------ No results for input(s): VITAMINB12, FOLATE, FERRITIN, TIBC, IRON, RETICCTPCT in the last 72 hours.  Coagulation profile No results for input(s): INR, PROTIME in the last 168 hours.  No results for input(s): DDIMER in the last 72 hours.  Cardiac Enzymes No results for input(s): CKMB, TROPONINI, MYOGLOBIN in the last 168 hours.  Invalid input(s):  CK ------------------------------------------------------------------------------------------------------------------ Invalid input(s): [place]   1. Multi-antibiotics resistant UTI: IV vancomycin for total of 14 days home health is being arranged for tomm, Outpatient pharmacy is closed today.   2. Hypokalemia. Being replaced . 3. Essential Hypertension. Continue losartan- hctz, , and labetolol  4. hypothyrdism continue synthroid  5. Acute diastolic CHF: I will stop the IV fluids and give her a dose of IV Lasix     Code Status Orders        Start     Ordered   01/25/16 2004  Full code  Continuous     01/25/16 2003    Code Status History    Date Active Date Inactive Code Status Order ID Comments User Context   12/24/2015  8:20 PM 12/26/2015  8:46 PM Full Code [id number]  Lytle Butte, MD ED   12/19/2015  4:10 AM 12/21/2015  6:43 PM Full Code [id number]  Harrie Foreman, MD Inpatient   08/26/2015  1:23 AM 08/27/2015  8:31 PM Full Code [id number]  Lance Coon, MD ED           Consults  Infectious disease DVT Prophylaxis  Lovenox  Lab Results  Component Value Date   PLT 217 01/25/2016     Time Spent in minutes   15min  Greater than 50% of time spent in care coordination and counseling patient regarding the condition and plan of care.   Dustin Flock M.D on 01/28/2016 at 3:18 PM  Between 7am to 6pm - Pager - [phone]  After 6pm go to [email or web address] - password [email or web address] [facility]   Office  [phone]

## 2016-01-29 ENCOUNTER — Telehealth: Payer: Self-pay

## 2016-01-29 LAB — CBC
HCT: 33 % — ABNORMAL LOW (ref 35.0–47.0)
Hemoglobin: 11.3 g/dL — ABNORMAL LOW (ref 12.0–16.0)
MCH: 28.6 pg (ref 26.0–34.0)
MCHC: 34.1 g/dL (ref 32.0–36.0)
MCV: 84 fL (ref 80.0–100.0)
PLATELETS: 193 10*3/uL (ref 150–440)
RBC: 3.93 MIL/uL (ref 3.80–5.20)
RDW: 16 % — ABNORMAL HIGH (ref 11.5–14.5)
WBC: 6.8 10*3/uL (ref 3.6–11.0)

## 2016-01-29 LAB — URINALYSIS, COMPLETE (UACMP) WITH MICROSCOPIC
Bacteria, UA: NONE SEEN
Bilirubin Urine: NEGATIVE
GLUCOSE, UA: NEGATIVE mg/dL
Ketones, ur: NEGATIVE mg/dL
Nitrite: NEGATIVE
PH: 5 (ref 5.0–8.0)
PROTEIN: NEGATIVE mg/dL
Specific Gravity, Urine: 1.004 — ABNORMAL LOW (ref 1.005–1.030)

## 2016-01-29 LAB — VANCOMYCIN, TROUGH: Vancomycin Tr: 20 ug/mL (ref 15–20)

## 2016-01-29 LAB — CREATININE, SERUM
CREATININE: 0.79 mg/dL (ref 0.44–1.00)
GFR calc Af Amer: >60 mL/min (ref >60)

## 2016-01-29 MED ORDER — PHENAZOPYRIDINE HCL 100 MG PO TABS: 100.0000 mg | ORAL_TABLET | Freq: Three times a day (TID) | ORAL | 0 refills | Status: DC

## 2016-01-29 MED ORDER — VANCOMYCIN HCL IN DEXTROSE 750-5 MG/150ML-% IV SOLN: 750.0000 mg | Freq: Two times a day (BID) | INTRAVENOUS | Status: DC

## 2016-01-29 MED ORDER — PHENAZOPYRIDINE HCL 100 MG PO TABS
100.0000 mg | ORAL_TABLET | Freq: Three times a day (TID) | ORAL | Status: DC
Start: 2016-01-29 — End: 2016-01-29
  Administered 2016-01-29 (×2): 100 mg via ORAL
  Filled 2016-01-29 (×2): qty 1

## 2016-01-29 NOTE — Discharge Summary (Signed)
[facility] [place] at [facility], 76 y.o., DOB [birth year], MRN [id number]. 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 female with a known history of UTI, nephrolithiasis, status post stent placement. Hypertension, CHF, CKD and breast cancer. The patient was sent to ED from urology clinic today due to multi-drug-resistant UTI. Patient has a ureteral stent in the past. And she had urine culture which showed VRE. Patient was started on therapy with vancomycin. And was seen by infectious disease who recommended total 14 days of antibiotics. During the hospitalization she also developed acute diastolic CHF. Patient was treated with IV Lasix with resolution of her symptoms.            Consults  ID  Significant Tests:  See full reports for all details     Dg Chest Port 1 View  Result Date: 01/27/2016 CLINICAL DATA:  Central line placement. EXAM: PORTABLE CHEST 1 VIEW COMPARISON:  01/23/2016 FINDINGS: RIGHT PICC line with tip in distal SVC. Normal cardiac silhouette. No effusion, infiltrate, pneumothorax. IMPRESSION: RIGHT PICC line with tip in distal SVC. Electronically Signed   By: Suzy Bouchard M.D.   On: 01/27/2016 20:39   US Thyroid  Result Date: 01/12/2016 CLINICAL DATA:  Nodule EXAM: THYROID ULTRASOUND TECHNIQUE: Ultrasound examination of the thyroid gland and adjacent soft tissues was performed. COMPARISON:  12/06/2014 FINDINGS: Parenchymal Echotexture: Mildly heterogenous Estimated total number of nodules >/= 1 cm: 2 Number of  spongiform nodules >/=  2 cm not described below (TR1): 0 Number of mixed cystic and solid nodules >/= 1.5 cm not described below ([place]): 0 _________________________________________________________ Isthmus: 0.9 cm thickness, stable Nodule # 1: Location: Isthmus; left of midline Size: 1.4 x 0.9 x 1 cm (previously 1 x 0.9 x 0.9 cm) Composition: solid/almost completely solid (2) Echogenicity: isoechoic (1) Shape: not taller-than-wide (0) Margins: ill-defined (0) Echogenic foci: none (0) ACR TI-RADS total points: 3. ACR TI-RADS risk category: TR3 (3 points). ACR TI-RADS recommendations: Given size (<1.4 cm) and appearance, this nodule does NOT meet TI-RADS criteria for biopsy or dedicated follow-up. _________________________________________________________ Right lobe: 3.8 x 2.1 x 1.9 cm (previously 4.3 x 2.1 x 2.5) Nodule # 1: Location: Right; Inferior Size: 2 x 2 x 1.8 cm (previously 2.3 x 2 x 2.1) Composition: solid/almost completely solid (2) Echogenicity: isoechoic (1) Shape: not taller-than-wide (0) Margins: ill-defined (0) Echogenic foci: none (0) ACR TI-RADS total points: 3. ACR TI-RADS risk category: TR3 (3 points). ACR TI-RADS recommendations: *Given size (>/= 1.5 - 2.4 cm) and appearance, a follow-up ultrasound in 1 year should be considered based on TI-RADS criteria. _________________________________________________________ Left lobe: 4.5 x 1.8 x 1.7 cm (previously 4.1 x 1.9 x 1.3) No discrete nodules are identified within the left lobe of the thyroid. IMPRESSION: 1. No enlargement of dominant inferior right nodule. Recommend 1 year follow-up ultrasound. The above is in keeping with the ACR TI-RADS recommendations - J Am Coll Radiol 2017;14:587-595. Electronically Signed   By: Lucrezia Europe M.D.   On: 01/12/2016 11:46       Today   Subjective:   Emma [name] patient  feeling better complains of some burning with urination  Objective:   Blood pressure (!) 155/62, pulse 62, temperature 97.7 F (36.5  C), temperature source Oral, resp. rate 17, height 5\' 4"  (1.626 m), weight 195 lb 11.2 oz (88.8 kg), SpO2 98 %.  .  Intake/Output Summary (Last 24 hours) at 01/29/16 1552 Last data filed at 01/29/16 1332  Gross per 24 hour  Intake              930 ml  Output                0 ml  Net              930 ml    Exam VITAL SIGNS: Blood pressure (!) 155/62, pulse 62, temperature 97.7 F (36.5 C), temperature source Oral, resp. rate 17, height 5\' 4"  (1.626 m), weight 195 lb 11.2 oz (88.8 kg), SpO2 98 %.  GENERAL:  76 y.o.-year-old patient lying in the bed with no acute distress.  EYES: Pupils equal, round, reactive to light and accommodation. No scleral icterus. Extraocular muscles intact.  HEENT: Head atraumatic, normocephalic. Oropharynx and nasopharynx clear.  NECK:  Supple, no jugular venous distention. No thyroid enlargement, no tenderness.  LUNGS: Normal breath sounds bilaterally, no wheezing, rales,rhonchi or crepitation. No use of accessory muscles of respiration.  CARDIOVASCULAR: S1, S2 normal. No murmurs, rubs, or gallops.  ABDOMEN: Soft, nontender, nondistended. Bowel sounds present. No organomegaly or mass.  EXTREMITIES: No pedal edema, cyanosis, or clubbing.  NEUROLOGIC: Cranial nerves II through XII are intact. Muscle strength 5/5 in all extremities. Sensation intact. Gait not checked.  PSYCHIATRIC: The patient is alert and oriented x 3.  SKIN: No obvious rash, lesion, or ulcer.   Data Review     CBC w Diff: Lab Results  Component Value Date   WBC 6.8 01/29/2016   HGB 11.3 (L) 01/29/2016   HGB 11.7 (L) 05/22/2014   HCT 33.0 (L) 01/29/2016   HCT 36.4 05/22/2014   PLT 193 01/29/2016   PLT 173 05/22/2014   LYMPHOPCT 9 01/25/2016   LYMPHOPCT 10.6 05/22/2014   MONOPCT 9 01/25/2016   MONOPCT 11.0 05/22/2014   EOSPCT 5 01/25/2016   EOSPCT 4.8 05/22/2014   BASOPCT 1 01/25/2016   BASOPCT 1.0 05/22/2014   CMP: Lab Results  Component Value Date   NA 138 01/25/2016   NA  137 07/12/2013   K 3.3 (L) 01/25/2016   K 4.7 07/12/2013   CL 102 01/25/2016   CL 107 07/12/2013   CO2 28 01/25/2016   CO2 20 (L) 07/12/2013   BUN 17 01/25/2016   BUN 18 07/12/2013   CREATININE 0.79 01/29/2016   CREATININE 0.64 05/22/2014   PROT 7.1 01/11/2016   PROT 7.0 05/22/2014   ALBUMIN 3.8 01/11/2016   ALBUMIN 4.2 05/22/2014   BILITOT 0.9 01/11/2016   BILITOT 0.8 05/22/2014   ALKPHOS 84 01/11/2016   ALKPHOS 95 05/22/2014   AST 32 01/11/2016   AST 29 05/22/2014   ALT 15 01/11/2016   ALT 17 05/22/2014  .  Micro Results No results found for this or any previous visit (from the past 240 hour(s)).      Code Status Orders        Start     Ordered   01/25/16 2004  Full code  Continuous     01/25/16 2003    Code Status History    Date Active Date Inactive Code Status Order ID Comments User Context  12/24/2015  8:20 PM 12/26/2015  8:46 PM Full Code [id number]  Lytle Butte, MD ED   12/19/2015  4:10 AM 12/21/2015  6:43 PM Full Code [id number]  Harrie Foreman, MD Inpatient   08/26/2015  1:23 AM 08/27/2015  8:31 PM Full Code [id number]  Lance Coon, MD ED          Follow-up Information    [name], MD In 7 days.   Specialty:  Family Medicine Why:  Office will call daughter to set up appt.  Contact information: [address] [place] [address] [phone]        [name], MD On 02/06/2016.   Specialty:  Infectious Diseases Why:  @ 8:30 AM Contact information: [place] [address] [phone]           Discharge Medications   Allergies as of 01/29/2016      Reactions   Sulfa Antibiotics    Other reaction(s): Kidney Disorder Other reaction(s): Kidney Disorder Other reaction(s): Kidney Disorder      Medication List    TAKE these medications   acetaminophen 500 MG tablet Commonly known as:  TYLENOL Take 500 mg by mouth every 6 (six)  hours as needed (for pain.).   anastrozole 1 MG tablet Commonly known as:  ARIMIDEX Take 1 tablet (1 mg total) by mouth daily.   aspirin 81 MG EC tablet Take 1 tablet (81 mg total) by mouth daily.   ergocalciferol 50000 units capsule Commonly known as:  VITAMIN D2 Take 1 capsule (50,000 Units total) by mouth once a week. What changed:  when to take this   furosemide 40 MG tablet Commonly known as:  LASIX Take 1 tablet (40 mg total) by mouth 2 (two) times daily. What changed:  when to take this   labetalol 300 MG tablet Commonly known as:  NORMODYNE TAKE 1 TABLET (300 MG TOTAL) BY MOUTH 2 (TWO) TIMES DAILY.   losartan-hydrochlorothiazide 100-25 MG tablet Commonly known as:  HYZAAR Take 1 tablet by mouth daily.   octreotide 30 MG injection Commonly known as:  SANDOSTATIN LAR Inject 30 mg into the muscle every 28 (twenty-eight) days.   phenazopyridine 100 MG tablet Commonly known as:  PYRIDIUM Take 1 tablet (100 mg total) by mouth 3 (three) times daily with meals.   potassium chloride SA 20 MEQ tablet Commonly known as:  K-DUR,KLOR-CON Once a day.   SYNTHROID 88 MCG tablet Generic drug:  levothyroxine Take 88 mcg by mouth daily before breakfast.   TIGER BALM PAIN RELIEVING 80-24-16 MG Ptch Generic drug:  Camphor-Menthol-Capsicum Place 1 patch onto the skin daily as needed (for knee pain.).   Vancomycin 750-5 MG/150ML-% Soln Commonly known as:  VANCOCIN Inject 150 mLs (750 mg total) into the vein every 12 (twelve) hours.          Total Time in preparing paper work, data evaluation and todays exam - 35 minutes  Dustin Flock M.D on 01/29/2016 at 3:52 PM  [facility]   Office  [phone]

## 2016-01-29 NOTE — Care Management (Signed)
Patient to discharge home today.  Patient to receive evening dose of Vanc prior to discharge.   Home administration to start tomorrow morning.  Manuela Schwartz with [facility] notified of discharge.  RNCM signing off.

## 2016-01-29 NOTE — Progress Notes (Signed)
Pharmacy Antibiotic Note  Emma [name] is a 76 y.o. female admitted on 01/25/2016 with UTI.  Pharmacy has been consulted for vancomycin dosing.  Plan: 12/31 Trough: 16   Level before 5th overall dose.  Goal trough 15-20 d/t bacteremia with previous UTI. Continue  Vancomycin 750 mg q 12 hours.  Continue to monitor renal function and adjust dose as needed.   01/29/16: Planning for 14 days of IV vancomycin. Will order another trough for today at 1600 at steady state to ensure level between 15-20 mcg/ml before pt goes home (a little early but pt discharging on vanc and RN would like to give vanc early before pt discharges home). Discussed trough plan with RN.  01/29/16:  VT @ 16:00 = 20 mcg/mL  Informed RN of VT and said it was ok to d/c pt on current dose since trough was therapeutic.    Height: 5\' 4"  (162.6 cm) Weight: 195 lb 11.2 oz (88.8 kg) IBW/kg (Calculated) : 54.7  Temp (24hrs), Avg:97.7 F (36.5 C), Min:97.5 F (36.4 C), Max:97.9 F (36.6 C)   Recent Labs Lab 01/25/16 1401 01/27/16 1834 01/29/16 0524 01/29/16 1605  WBC 6.9  --  6.8  --   CREATININE 0.79 0.81 0.79  --   VANCOTROUGH  --  16  --  20    Estimated Creatinine Clearance: 66.5 mL/min (by C-G formula based on SCr of 0.79 mg/dL).    Allergies  Allergen Reactions  . Sulfa Antibiotics     Other reaction(s): Kidney Disorder Other reaction(s): Kidney Disorder Other reaction(s): Kidney Disorder    Antimicrobials this admission: vancomycin 12/29 >>    >>   Dose adjustments this admission:   Microbiology results: 12/22  UCx: VSE     Thank you for allowing pharmacy to be a part of this patient's care.  Orene Desanctis, PharmD Clinical Pharmacist 01/29/2016 5:10 PM

## 2016-01-29 NOTE — Progress Notes (Signed)
[facility] INFECTIOUS DISEASE PROGRESS NOTE Date of Admission:  01/25/2016     ID: Emma [name] is a 76 y.o. female with VRE UTI  Active Problems:   UTI (urinary tract infection)  Subjective: No fevers, chills but still some dysuria. picc placed  ROS  Eleven systems are reviewed and negative except per hpi  Medications:  Antibiotics Given (last 72 hours)    Date/Time Action Medication Dose Rate   01/26/16 1717 Given   vancomycin (VANCOCIN) IVPB 750 mg/150 ml premix 750 mg 150 mL/hr   01/27/16 0504 Given   vancomycin (VANCOCIN) IVPB 750 mg/150 ml premix 750 mg 150 mL/hr   01/27/16 1841 Given   vancomycin (VANCOCIN) IVPB 750 mg/150 ml premix 750 mg 150 mL/hr   01/28/16 0725 Given   vancomycin (VANCOCIN) IVPB 750 mg/150 ml premix 750 mg 150 mL/hr   01/28/16 1738 Given   vancomycin (VANCOCIN) IVPB 750 mg/150 ml premix 750 mg 150 mL/hr   01/29/16 0523 Given   vancomycin (VANCOCIN) IVPB 750 mg/150 ml premix 750 mg 150 mL/hr     . anastrozole  1 mg Oral Daily  . aspirin EC  81 mg Oral Daily  . enoxaparin (LOVENOX) injection  40 mg Subcutaneous Q24H  . furosemide  40 mg Oral Daily  . losartan  100 mg Oral Daily   And  . hydrochlorothiazide  25 mg Oral Daily  . labetalol  300 mg Oral BID  . levothyroxine  88 mcg Oral QAC breakfast  . phenazopyridine  100 mg Oral TID WC  . potassium chloride SA  20 mEq Oral Daily  . vancomycin  750 mg Intravenous Q12H    Objective: Vital signs in last 24 hours: Temp:  [97.5 F (36.4 C)-97.9 F (36.6 C)] 97.7 F (36.5 C) (01/02 1354) Pulse Rate:  [62-65] 62 (01/02 1354) Resp:  [16-20] 17 (01/02 1354) BP: (150-187)/(57-65) 155/62 (01/02 1354) SpO2:  [96 %-99 %] 98 % (01/02 1354) Weight:  [88.8 kg (195 lb 11.2 oz)] 88.8 kg (195 lb 11.2 oz) (01/02 0606) Constitutional:  oriented to person, place, and time. appears HOH obese  HENT: [place]/AT, PERRLA, no scleral icterus Mouth/Throat: Oropharynx is clear and moist. No oropharyngeal  exudate.  Cardiovascular: Normal rate, regular rhythm and normal heart sounds. Exam reveals no gallop and no friction rub.  No murmur heard.  Pulmonary/Chest: Effort normal and breath sounds normal. No respiratory distress.  has no wheezes.  Neck = supple, no nuchal rigidity Abdominal: Soft. Bowel sounds are normal.  exhibits no distension. There is no tenderness.  Lymphadenopathy: no cervical adenopathy. No axillary adenopathy Neurological: alert and oriented to person, place, and time.  Skin: Skin is warm and dry. No rash noted. No erythema.  Psychiatric: a normal mood and affect.  behavior is normal.    Lab Results  Recent Labs  01/27/16 1834 01/29/16 0524  WBC  --  6.8  HGB  --  11.3*  HCT  --  33.0*  CREATININE 0.81 0.79    Microbiology: Results for orders placed or performed in visit on 01/18/16  CULTURE, URINE COMPREHENSIVE     Status: Abnormal   Collection Time: 01/18/16  3:33 PM  Result Value Ref Range Status   Urine Culture, Comprehensive Final report (A)  Final   Result 1 Enterococcus faecium (A)  Final    Comment: Greater than 100,000 colony forming units per mL Note: this isolate is vancomycin-susceptible. This information is provided for epidemiologic purposes only: vancomycin is not among the  antibiotics recommended for therapy of urinary tract infections caused by Enterococcus.    Result 2 Yeast isolated. (A)  Final    Comment: Request for further identification must be made within 1 week. 10,000-25,000 colony forming units per mL    ANTIMICROBIAL SUSCEPTIBILITY Comment  Final    Comment:       ** S = Susceptible; I = Intermediate; R = Resistant **                    P = Positive; N = Negative             MICS are expressed in micrograms per mL    Antibiotic                 RSLT#1    RSLT#2    RSLT#3    RSLT#4 Ciprofloxacin                  R Levofloxacin                   R Nitrofurantoin                 R Penicillin                      R Tetracycline                   R Vancomycin                     S      Studies/Results: Dg Chest Port 1 View  Result Date: 01/27/2016 CLINICAL DATA:  Central line placement. EXAM: PORTABLE CHEST 1 VIEW COMPARISON:  01/23/2016 FINDINGS: RIGHT PICC line with tip in distal SVC. Normal cardiac silhouette. No effusion, infiltrate, pneumothorax. IMPRESSION: RIGHT PICC line with tip in distal SVC. Electronically Signed   By: Suzy Bouchard M.D.   On: 01/27/2016 20:39    Assessment/Plan: Emma [name] is a 76 y.o. female with hx recurrent UTIs, admitted when otpt Cx done at urology grew resistant Enterococcus. She recently had R ureteral stent placed for infected R stone and at that time was  found to have e coli bacteremia and uti.   She has had multiple cultures done including VRE (12/4), E coli 11/21 and now Vanc sensitive enterococcus. She had most recent procedure on 12/11. She seems to still have a stent in place. Doing better but still some dysuria Recommendations Will repeat in and out UA today - add on cx if +  She will need at least 14 days of IV abx.  Would suggest removal of stent if possible.   She should be seen by urology as otpt to see if this is possible.  I can see in 10 -14 days - I have completed IV abx order sheet as well.  Thank you very much for the consult. Will follow with you.  Whittley Carandang P   01/29/2016, 3:00 PM

## 2016-01-29 NOTE — Telephone Encounter (Signed)
Pt daughter was instructed to take her to the ER per Dr. Pilar Jarvis for IV treatment for her urinary infection.

## 2016-01-29 NOTE — Progress Notes (Signed)
Pharmacy Antibiotic Note  Emma [name] is a 76 y.o. female admitted on 01/25/2016 with UTI.  Pharmacy has been consulted for vancomycin dosing.  Plan: 12/31 Trough: 16   Level before 5th overall dose.  Goal trough 15-20 d/t bacteremia with previous UTI. Continue  Vancomycin 750 mg q 12 hours.  Continue to monitor renal function and adjust dose as needed.   01/29/16: Planning for 14 days of IV vancomycin. Will order another trough for today at 1600 at steady state to ensure level between 15-20 mcg/ml before pt goes home (a little early but pt discharging on vanc and RN would like to give vanc early before pt discharges home). Discussed trough plan with RN.   Height: 5\' 4"  (162.6 cm) Weight: 195 lb 11.2 oz (88.8 kg) IBW/kg (Calculated) : 54.7  Temp (24hrs), Avg:97.6 F (36.4 C), Min:97.5 F (36.4 C), Max:97.9 F (36.6 C)   Recent Labs Lab 01/25/16 1401 01/27/16 1834 01/29/16 0524  WBC 6.9  --  6.8  CREATININE 0.79 0.81 0.79  VANCOTROUGH  --  16  --     Estimated Creatinine Clearance: 66.5 mL/min (by C-G formula based on SCr of 0.79 mg/dL).    Allergies  Allergen Reactions  . Sulfa Antibiotics     Other reaction(s): Kidney Disorder Other reaction(s): Kidney Disorder Other reaction(s): Kidney Disorder    Antimicrobials this admission: vancomycin 12/29 >>    >>   Dose adjustments this admission:   Microbiology results: 12/22  UCx: VSE     Thank you for allowing pharmacy to be a part of this patient's care.  Rocky Morel, PharmD, BCPS Clinical Pharmacist 01/29/2016 11:15 AM

## 2016-01-29 NOTE — Care Management Important Message (Signed)
Important Message  Patient Details  Name: Emma [name] MRN: [id number] Date of Birth: 12-18-1940   Medicare Important Message Given:  N/A - LOS <3 / Initial given by admissions    Beverly Sessions, RN 01/29/2016, 10:45 AM

## 2016-01-29 NOTE — Progress Notes (Signed)
IV was removed. PICC line still in place. Pt was given last dose of IV ABX prior to discharge. Discharge instructions, follow-up appointments, and prescriptions were provided to the pt and daughter at bedside. The pt was taken downstairs via wheelchair by NT.

## 2016-02-04 NOTE — Telephone Encounter (Signed)
Pt daughter, Ronni Rumble with concerns of whom is going to remove the PICC line placed in her mother, Emma [name] per our Dr. Pilar Jarvis.  Please call daughter at [phone] per her request.

## 2016-02-05 ENCOUNTER — Encounter: Payer: Self-pay | Admitting: Family

## 2016-02-05 ENCOUNTER — Ambulatory Visit: Payer: Medicare Other | Attending: Family | Admitting: Family

## 2016-02-05 VITALS — BP 118/70 | HR 65 | Resp 18 | Ht 64.0 in | Wt 196.2 lb

## 2016-02-05 DIAGNOSIS — E039 Hypothyroidism, unspecified: Secondary | ICD-10-CM | POA: Diagnosis not present

## 2016-02-05 DIAGNOSIS — I5032 Chronic diastolic (congestive) heart failure: Secondary | ICD-10-CM | POA: Diagnosis not present

## 2016-02-05 DIAGNOSIS — Z86718 Personal history of other venous thrombosis and embolism: Secondary | ICD-10-CM | POA: Diagnosis not present

## 2016-02-05 DIAGNOSIS — I10 Essential (primary) hypertension: Secondary | ICD-10-CM

## 2016-02-05 DIAGNOSIS — I130 Hypertensive heart and chronic kidney disease with heart failure and stage 1 through stage 4 chronic kidney disease, or unspecified chronic kidney disease: Secondary | ICD-10-CM | POA: Insufficient documentation

## 2016-02-05 DIAGNOSIS — Z1624 Resistance to multiple antibiotics: Secondary | ICD-10-CM | POA: Diagnosis not present

## 2016-02-05 DIAGNOSIS — A419 Sepsis, unspecified organism: Secondary | ICD-10-CM | POA: Diagnosis not present

## 2016-02-05 DIAGNOSIS — Z7982 Long term (current) use of aspirin: Secondary | ICD-10-CM | POA: Diagnosis not present

## 2016-02-05 DIAGNOSIS — Z853 Personal history of malignant neoplasm of breast: Secondary | ICD-10-CM | POA: Insufficient documentation

## 2016-02-05 DIAGNOSIS — N189 Chronic kidney disease, unspecified: Secondary | ICD-10-CM | POA: Diagnosis not present

## 2016-02-05 DIAGNOSIS — K219 Gastro-esophageal reflux disease without esophagitis: Secondary | ICD-10-CM | POA: Insufficient documentation

## 2016-02-05 NOTE — Progress Notes (Signed)
Patient ID: Emma [name], female    DOB: 08/03/1940, 76 y.o.   MRN: [id number]  HPI  Emma [name] is a 76 y/o female with a history of hypothyroidism, DVT, HTN, HOH, kidney stones, hiatal hernia, GERD, CKD, left breast cancer, sepsis and chronic heart failure.  Last echo was done 12/25/15 which showed an EF of 50-55% without any valvular regurgitation.   She was last admitted on 01/25/16 due to multidrug resistant infection in urinary system. Developed acute HF while hospitalized and was treated with IV diuretics with resolution of her symptoms. ID consult was obtained with recommendation of 14 days of IV antibiotics at discharge via a PICC line. Previous admission was on 12/24/15 with acute on chronic heart failure. Treated with IV diuretics and BP was treated.   She presents today for her initial visit with fatigue and shortness of breath with minimal exertion. Does experience palpitations when her IV antibiotics are infusing but that lasts short-term. Denies any swelling in her legs. Already weighing daily. Does have the PICC line in place in her right upper arm.   Past Medical History:  Diagnosis Date  . Aromatase inhibitor use   . Breast cancer, left breast ([facility]) 06/19/2014  . CHF (congestive heart failure) ([facility])    recent sepsis  . Chronic kidney disease   . Dizziness   . Dyspnea    recently while admitted  . GERD (gastroesophageal reflux disease)   . History of hiatal hernia   . History of kidney stones   . HOH (hard of hearing)    severe  . Hypertension   . Hypothyroidism   . Leg DVT (deep venous thromboembolism), acute ([place]) 07/2015   left leg after fracture  . 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 Pain    chest pain while admitted  . Thyroid disease    Past Surgical History:  Procedure Laterality Date  . ABDOMINAL HYSTERECTOMY     partial  . BREAST EXCISIONAL BIOPSY Left 12/2012   +  . BREAST LUMPECTOMY WITH SENTINEL LYMPH NODE BIOPSY Left 2014  . CHOLECYSTECTOMY    .  CYSTOSCOPY W/ URETERAL STENT PLACEMENT Right 01/07/2016   Procedure: CYSTOSCOPY WITH STENT REPLACEMENT;  Surgeon: Hollice Espy, MD;  Location: [facility] ORS;  Service: Urology;  Laterality: Right;  . CYSTOSCOPY WITH RETROGRADE PYELOGRAM, URETEROSCOPY AND STENT PLACEMENT Right 12/19/2015   Procedure: CYSTOSCOPY WITH RETROGRADE PYELOGRAM, URETEROSCOPY AND STENT PLACEMENT;  Surgeon: Ardis Hughs, MD;  Location: [facility] ORS;  Service: Urology;  Laterality: Right;  . URETEROSCOPY WITH HOLMIUM LASER LITHOTRIPSY Right 01/07/2016   Procedure: URETEROSCOPY WITH HOLMIUM LASER LITHOTRIPSY;  Surgeon: Hollice Espy, MD;  Location: [facility] ORS;  Service: Urology;  Laterality: Right;   Family History  Problem Relation Age of Onset  . Breast cancer Mother 33  . Breast cancer Maternal Aunt 68  . Prostate cancer Father    Social History  Substance Use Topics  . Smoking status: Never Smoker  . Smokeless tobacco: Never Used  . Alcohol use No   Allergies  Allergen Reactions  . Sulfa Antibiotics     Other reaction(s): Kidney Disorder Other reaction(s): Kidney Disorder Other reaction(s): Kidney Disorder   Prior to Admission medications   Medication Sig Start Date End Date Taking? Authorizing Provider  acetaminophen (TYLENOL) 500 MG tablet Take 500 mg by mouth every 6 (six) hours as needed (for pain.).   Yes Historical Provider, MD  anastrozole (ARIMIDEX) 1 MG tablet Take 1 tablet (1 mg total) by mouth daily. 11/06/15  Yes [name], MD  aspirin EC 81 MG EC tablet Take 1 tablet (81 mg total) by mouth daily. 12/27/15  Yes Demetrios Loll, MD  Camphor-Menthol-Capsicum (TIGER BALM PAIN RELIEVING) 80-24-16 MG PTCH Place 1 patch onto the skin daily as needed (for knee pain.).   Yes Historical Provider, MD  ergocalciferol (VITAMIN D2) 50000 units capsule Take 1 capsule (50,000 Units total) by mouth once a week. Patient taking differently: Take 50,000 Units by mouth every Monday.  11/06/15  Yes [name], MD  furosemide (LASIX) 40 MG tablet Take 40 mg by mouth daily.   Yes Historical Provider, MD  labetalol (NORMODYNE) 300 MG tablet TAKE 1 TABLET (300 MG TOTAL) BY MOUTH 2 (TWO) TIMES DAILY. 08/09/14  Yes Historical Provider, MD  levothyroxine (SYNTHROID) 88 MCG tablet Take 88 mcg by mouth daily before breakfast.  06/20/14  Yes Historical Provider, MD  losartan-hydrochlorothiazide (HYZAAR) 100-25 MG tablet Take 1 tablet by mouth daily.   Yes Historical Provider, MD  octreotide (SANDOSTATIN LAR) 30 MG injection Inject 30 mg into the muscle every 28 (twenty-eight) days.    Yes Historical Provider, MD  phenazopyridine (PYRIDIUM) 100 MG tablet Take 1 tablet (100 mg total) by mouth 3 (three) times daily with meals. 01/29/16  Yes Dustin Flock, MD  potassium chloride SA (K-DUR,KLOR-CON) 20 MEQ tablet Once a day. 01/11/16  Yes [name], MD  Vancomycin (VANCOCIN) 750-5 MG/150ML-% SOLN Inject 150 mLs (750 mg total) into the vein every 12 (twelve) hours. 01/29/16  Yes Dustin Flock, MD     Review of Systems  Constitutional: Positive for fatigue. Negative for appetite change.  HENT: Positive for hearing loss. Negative for congestion, postnasal drip and sore throat.   Eyes: Negative.   Respiratory: Positive for shortness of breath (with minimal exertion at times). Negative for cough and chest tightness.   Cardiovascular: Positive for palpitations (with IV antibiotics). Negative for chest pain and leg swelling.  Gastrointestinal: Negative for abdominal distention and abdominal pain.  Endocrine: Negative.   Genitourinary: Positive for dysuria (minimal). Negative for hematuria.  Musculoskeletal: Negative for back pain and neck pain.  Skin: Negative.   Allergic/Immunologic: Negative.   Neurological: Positive for light-headedness (with sudden position changes). Negative for dizziness.  Hematological: Negative for adenopathy. Bruises/bleeds easily.  Psychiatric/Behavioral: Negative for  dysphoric mood, sleep disturbance (sleeping on 1 pillow) and suicidal ideas. The patient is not nervous/anxious.    Vitals:   02/05/16 1310  BP: 118/70  Pulse: 65  Resp: 18  SpO2: 97%  Weight: 196 lb 4 oz (89 kg)  Height: 5\' 4"  (1.626 m)   Wt Readings from Last 3 Encounters:  02/05/16 196 lb 4 oz (89 kg)  01/29/16 195 lb 11.2 oz (88.8 kg)  01/17/16 192 lb 9.6 oz (87.4 kg)   Lab Results  Component Value Date   CREATININE 0.79 01/29/2016   CREATININE 0.81 01/27/2016   CREATININE 0.79 01/25/2016   Physical Exam  Constitutional: She is oriented to person, place, and time. She appears well-developed and well-nourished.  HENT:  Head: Normocephalic and atraumatic.  Right Ear: Decreased hearing is noted.  Left Ear: Decreased hearing is noted.  Eyes: Conjunctivae are normal. Pupils are equal, round, and reactive to light.  Neck: Normal range of motion. Neck supple. No JVD present.  Cardiovascular: Normal rate and regular rhythm.   Pulmonary/Chest: Effort normal. She has no wheezes. She has no rales.  Abdominal: Soft. She exhibits no distension. There is no tenderness.  Musculoskeletal: She  exhibits no edema or tenderness.  Neurological: She is alert and oriented to person, place, and time.  Skin: Skin is warm and dry.  Psychiatric: She has a normal mood and affect. Her behavior is normal. Thought content normal.  Nursing note and vitals reviewed.  Assessment & Plan:  1: Chronic heart failure with preserved ejection fraction- - NYHA class III - euvolemic - already weighing daily. Instructed to call for an overnight weight gain of >2 pounds or a weekly weight gain of >5 pounds - not adding salt to her food. Currently living with her daughter & son-in-law who is doing the shopping/cooking. Reading food labels for sodium content to stay under 2000mg  sodium daily. Low sodium cookbook given to her today along with written dietary information.  - received flu vaccine for this season -  sees her cardiologist ([place]) 04/01/16  2: HTN- - BP looks good today - sees her PCP Kary Kos) on 02/07/16  3: Sepsis- - continues to have her PICC line in place - receives IV antibiotics - following with ID Ola Spurr)  Return in 1 month or sooner for any questions/problems before then.

## 2016-02-05 NOTE — Patient Instructions (Signed)
Continue weighing daily and call for an overnight weight gain of > 2 pounds or a weekly weight gain of >5 pounds. 

## 2016-02-06 ENCOUNTER — Telehealth: Payer: Self-pay | Admitting: Urology

## 2016-02-06 DIAGNOSIS — I5032 Chronic diastolic (congestive) heart failure: Secondary | ICD-10-CM | POA: Insufficient documentation

## 2016-02-06 DIAGNOSIS — I5022 Chronic systolic (congestive) heart failure: Secondary | ICD-10-CM | POA: Insufficient documentation

## 2016-02-06 NOTE — Telephone Encounter (Signed)
done

## 2016-02-07 ENCOUNTER — Encounter: Payer: Self-pay | Admitting: Urology

## 2016-02-07 ENCOUNTER — Emergency Department
Admission: EM | Admit: 2016-02-07 | Discharge: 2016-02-08 | Disposition: A | Discharge status: Home / Self Care | Payer: Medicare Other | Attending: Emergency Medicine | Admitting: Emergency Medicine

## 2016-02-07 ENCOUNTER — Encounter: Payer: Self-pay | Admitting: Emergency Medicine

## 2016-02-07 ENCOUNTER — Ambulatory Visit (INDEPENDENT_AMBULATORY_CARE_PROVIDER_SITE_OTHER): Payer: Medicare Other | Admitting: Urology

## 2016-02-07 VITALS — BP 128/85 | HR 63 | Ht 65.0 in | Wt 195.0 lb

## 2016-02-07 DIAGNOSIS — N201 Calculus of ureter: Secondary | ICD-10-CM | POA: Diagnosis not present

## 2016-02-07 DIAGNOSIS — R109 Unspecified abdominal pain: Secondary | ICD-10-CM

## 2016-02-07 DIAGNOSIS — Z79899 Other long term (current) drug therapy: Secondary | ICD-10-CM | POA: Insufficient documentation

## 2016-02-07 DIAGNOSIS — I130 Hypertensive heart and chronic kidney disease with heart failure and stage 1 through stage 4 chronic kidney disease, or unspecified chronic kidney disease: Secondary | ICD-10-CM | POA: Diagnosis not present

## 2016-02-07 DIAGNOSIS — Z853 Personal history of malignant neoplasm of breast: Secondary | ICD-10-CM | POA: Insufficient documentation

## 2016-02-07 DIAGNOSIS — N390 Urinary tract infection, site not specified: Secondary | ICD-10-CM

## 2016-02-07 DIAGNOSIS — I5032 Chronic diastolic (congestive) heart failure: Secondary | ICD-10-CM | POA: Insufficient documentation

## 2016-02-07 DIAGNOSIS — E039 Hypothyroidism, unspecified: Secondary | ICD-10-CM | POA: Diagnosis not present

## 2016-02-07 DIAGNOSIS — N3000 Acute cystitis without hematuria: Secondary | ICD-10-CM | POA: Insufficient documentation

## 2016-02-07 DIAGNOSIS — R1013 Epigastric pain: Secondary | ICD-10-CM | POA: Insufficient documentation

## 2016-02-07 DIAGNOSIS — N189 Chronic kidney disease, unspecified: Secondary | ICD-10-CM | POA: Insufficient documentation

## 2016-02-07 DIAGNOSIS — T83511D Infection and inflammatory reaction due to indwelling urethral catheter, subsequent encounter: Secondary | ICD-10-CM

## 2016-02-07 LAB — CBC
HEMATOCRIT: 36 % (ref 35.0–47.0)
HEMOGLOBIN: 12.3 g/dL (ref 12.0–16.0)
MCH: 28.4 pg (ref 26.0–34.0)
MCHC: 34.1 g/dL (ref 32.0–36.0)
MCV: 83.3 fL (ref 80.0–100.0)
Platelets: 236 10*3/uL (ref 150–440)
RBC: 4.32 MIL/uL (ref 3.80–5.20)
RDW: 15.6 % — ABNORMAL HIGH (ref 11.5–14.5)
WBC: 10.4 10*3/uL (ref 3.6–11.0)

## 2016-02-07 LAB — COMPREHENSIVE METABOLIC PANEL
ALBUMIN: 3.8 g/dL (ref 3.5–5.0)
ALK PHOS: 103 U/L (ref 38–126)
ALT: 16 U/L (ref 14–54)
ANION GAP: 11 (ref 5–15)
AST: 30 U/L (ref 15–41)
BILIRUBIN TOTAL: 0.7 mg/dL (ref 0.3–1.2)
BUN: 15 mg/dL (ref 6–20)
CALCIUM: 9.4 mg/dL (ref 8.9–10.3)
CO2: 28 mmol/L (ref 22–32)
Chloride: 99 mmol/L — ABNORMAL LOW (ref 101–111)
Creatinine, Ser: 0.85 mg/dL (ref 0.44–1.00)
GFR calc non Af Amer: >60 mL/min (ref >60)
GLUCOSE: 117 mg/dL — AB (ref 65–99)
POTASSIUM: 3.5 mmol/L (ref 3.5–5.1)
SODIUM: 138 mmol/L (ref 135–145)
TOTAL PROTEIN: 7.4 g/dL (ref 6.5–8.1)

## 2016-02-07 LAB — URINALYSIS, COMPLETE
Bilirubin, UA: NEGATIVE
Glucose, UA: NEGATIVE
KETONES UA: NEGATIVE
Nitrite, UA: NEGATIVE
PH UA: 5.5 (ref 5.0–7.5)
Urobilinogen, Ur: 0.2 mg/dL (ref 0.2–1.0)

## 2016-02-07 LAB — LIPASE, BLOOD: Lipase: 27 U/L (ref 11–51)

## 2016-02-07 LAB — MICROSCOPIC EXAMINATION
BACTERIA UA: NONE SEEN
EPITHELIAL CELLS (NON RENAL): NONE SEEN /HPF (ref 0–10)

## 2016-02-07 MED ORDER — LIDOCAINE HCL 2 % EX GEL
1.0000 "application " | Freq: Once | CUTANEOUS | Status: AC
  Administered 2016-02-07: 1 via URETHRAL

## 2016-02-07 MED ORDER — CIPROFLOXACIN HCL 500 MG PO TABS
500.0000 mg | ORAL_TABLET | Freq: Once | ORAL | Status: AC
  Administered 2016-02-07: 500 mg via ORAL

## 2016-02-07 NOTE — ED Triage Notes (Signed)
Pt presents to ED 15 via [facility] c/o abdominal pain that started last night; pt is unable to describe what type of pain, pt states having a 5lb weight gain since last night; and pt has history of CHF; daughter states pt is being treated for sepsis currently via antibiotics through PICC line

## 2016-02-07 NOTE — Progress Notes (Signed)
   02/07/16  CC:  Chief Complaint  Patient presents with  . Cysto Stent Removal    HPI: 76 year old female who presents today for cystoscopy, stent removal. She initially presented with a 5 mm right distal ureteral stone recurring urgent ureteral stent placement in the setting of concern for sepsis. She ultimately went a definitive procedure on 01/07/2016. Her postoperative course was course was complicated by multidrug-resistant urinary tract infection requiring IV antibiotics via PICC line managed by Dr. Ola Spurr from infectious disease.    She continues on IV antibiotics and will continue this for a few more days following her stent removal today. Otherwise, no fevers, chills, or significant bleeding. She does have some irritation from her ureteral stent.  Blood pressure 128/85, pulse 63, height 5\' 5"  (1.651 m), weight 195 lb (88.5 kg). NED. A&Ox3.   No respiratory distress   Abd soft, NT, ND Normal external genitalia with patent urethral meatus  Cystoscopy/ Stent removal procedure  Patient identification was confirmed, informed consent was obtained, and patient was prepped using Betadine solution.  Lidocaine jelly was administered per urethral meatus.    Preoperative abx where received prior to procedure.    Procedure: - Flexible cystoscope introduced, without any difficulty.   - Thorough search of the bladder revealed:    normal urethral meatus  Stent seen emanating from right ureteral orifice, grasped with stent graspers, and removed in entirety.    Moderate debris in bladder limiting visualization.  Post-Procedure: - Patient tolerated the procedure well   Assessment/ Plan:  1. Ureteral stone S/p URS. LL for right distal ureteral stone Stent removed today Warning symptoms reviewed - Urinalysis, Complete - ciprofloxacin (CIPRO) tablet 500 mg; Take 1 tablet (500 mg total) by mouth once. - lidocaine (XYLOCAINE) 2 % jelly 1 application; Place 1 application into the  urethra once. - US Renal; Future  2. Urinary tract infection associated with catheterization of urinary tract, unspecified indwelling urinary catheter type, subsequent encounter MDR UTI (VRE) on IV vanc by Dr.Fitzgerald  Return in about 4 weeks (around 03/06/2016) for RUS (f/u [place] or BUA is fine).  Hollice Espy, MD

## 2016-02-07 NOTE — ED Notes (Signed)
ED Provider at bedside. 

## 2016-02-08 ENCOUNTER — Encounter: Payer: Self-pay | Admitting: Radiology

## 2016-02-08 ENCOUNTER — Telehealth: Payer: Self-pay | Admitting: *Deleted

## 2016-02-08 ENCOUNTER — Telehealth: Payer: Self-pay

## 2016-02-08 ENCOUNTER — Emergency Department: Payer: Medicare Other

## 2016-02-08 DIAGNOSIS — N3000 Acute cystitis without hematuria: Secondary | ICD-10-CM | POA: Diagnosis not present

## 2016-02-08 LAB — URINALYSIS, COMPLETE (UACMP) WITH MICROSCOPIC
BILIRUBIN URINE: NEGATIVE
Bacteria, UA: NONE SEEN
Glucose, UA: NEGATIVE mg/dL
KETONES UR: NEGATIVE mg/dL
Nitrite: NEGATIVE
PROTEIN: NEGATIVE mg/dL
Specific Gravity, Urine: 1.013 (ref 1.005–1.030)
pH: 5 (ref 5.0–8.0)

## 2016-02-08 LAB — BRAIN NATRIURETIC PEPTIDE: B NATRIURETIC PEPTIDE 5: 160 pg/mL — AB (ref 0.0–100.0)

## 2016-02-08 LAB — TROPONIN I: Troponin I: <0.03 ng/mL (ref <0.03)

## 2016-02-08 MED ORDER — MORPHINE SULFATE (PF) 2 MG/ML IV SOLN
INTRAVENOUS | Status: AC
  Filled 2016-02-08: qty 1

## 2016-02-08 MED ORDER — MORPHINE SULFATE (PF) 2 MG/ML IV SOLN
2.0000 mg | Freq: Once | INTRAVENOUS | Status: AC
  Administered 2016-02-08: 2 mg via INTRAVENOUS

## 2016-02-08 MED ORDER — FUROSEMIDE 10 MG/ML IJ SOLN
40.0000 mg | Freq: Once | INTRAMUSCULAR | Status: AC
  Administered 2016-02-08: 40 mg via INTRAVENOUS
  Filled 2016-02-08: qty 4

## 2016-02-08 MED ORDER — IOPAMIDOL (ISOVUE-300) INJECTION 61%
30.0000 mL | Freq: Once | INTRAVENOUS | Status: AC
  Administered 2016-02-08: 30 mL via ORAL

## 2016-02-08 MED ORDER — IOPAMIDOL (ISOVUE-300) INJECTION 61%
100.0000 mL | Freq: Once | INTRAVENOUS | Status: AC | PRN
  Administered 2016-02-08: 100 mL via INTRAVENOUS

## 2016-02-08 MED ORDER — ONDANSETRON HCL 4 MG/2ML IJ SOLN
4.0000 mg | Freq: Once | INTRAMUSCULAR | Status: AC
  Administered 2016-02-08: 4 mg via INTRAVENOUS

## 2016-02-08 MED ORDER — ONDANSETRON HCL 4 MG/2ML IJ SOLN
INTRAMUSCULAR | Status: AC
  Filled 2016-02-08: qty 2

## 2016-02-08 NOTE — Telephone Encounter (Signed)
Pt daughter called after hours triage line c/o pt having severe abd pain post stent removal. Triage line and home health nurse advised pt to seek tx at the ER. Per daughter pt went to ER and received IV pain medication which helped some but pt continues to be in a lot of pain, 7-8/10. Per daughter pt advised staff yesterday of lower abd pain prior to stent removal and pain got worse after removal. Reinforced with daughter pt can take ibuprofen/tylenol for pain, apply heat, and rest. Reinforced with daughter pts can have stent pain up to 48hrs post removal. Daughter voiced understanding but inquired about what can be done next. Daughter also inquired about pt taking oral abx post IV abx. Please advise.

## 2016-02-08 NOTE — Telephone Encounter (Signed)
Continue to reassure. It is normal to have some ureteral spasm/discomfort following stent removal as you indicated. Worst case scenario, we would have to replace the stent but I would prefer to avoid this especially given that able continued to make treating her infection more difficult.    No need for oral abx after IV abx.    Please have her follow up with renal ultrasound as scheduled.    Hollice Espy, MD

## 2016-02-08 NOTE — ED Notes (Signed)
Pt returned from CT °

## 2016-02-08 NOTE — ED Notes (Signed)
Patient transported to CT 

## 2016-02-08 NOTE — Telephone Encounter (Signed)
Per Dr. Rogue Bussing - there are new findings in the liver. Patient needs to have an apt in cancer center to discuss the tx plan. Jolleen, pls let pt know. Thanks.

## 2016-02-08 NOTE — ED Provider Notes (Signed)
[facility] Emergency Department Provider Note _   First MD Initiated Contact with Patient 02/07/16 2349     (approximate)  I have reviewed the triage vital signs and the nursing notes.   HISTORY  Chief Complaint Abdominal Pain    HPI Emma [name] is a 76 y.o. female with below list of chronic medical conditions presents to the emergency department with a one-day history of epigastric/bilateral lower quadrant abdominal pain. Of note patient recently had a urinary tract infection that resulted in sepsis for which she has a PICC line in place receiving antibiotics. Patient denies any fever admits to nausea however no vomiting. Patient denies any diarrhea. Of note patient had her stent removed yesterday however the pain that she is currently experiencing proceeded the removal of her stent. She states her current pain score 7 out of 10.   Past Medical History:  Diagnosis Date  . Aromatase inhibitor use   . Breast cancer, left breast ([place]) 06/19/2014  . CHF (congestive heart failure) (HCC)    recent sepsis  . Chronic kidney disease   . Dizziness   . Dyspnea    recently while admitted  . GERD (gastroesophageal reflux disease)   . History of hiatal hernia   . History of kidney stones   . HOH (hard of hearing)    severe  . Hypertension   . Hypothyroidism   . Leg DVT (deep venous thromboembolism), acute ([place]) 07/2015   left leg after fracture  . 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 Pain    chest pain while admitted  . Thyroid disease     Patient Active Problem List   Diagnosis Date Noted  . Chronic diastolic heart failure ([place]) 02/06/2016  . UTI (urinary tract infection) 01/25/2016  . Sepsis ([place]) 12/19/2015  . 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 Fall 08/25/2015  . Anxiety 08/25/2015  . GERD (gastroesophageal reflux disease) 08/25/2015  . HTN (hypertension) 08/25/2015  . Hypothyroidism 08/25/2015  . IDA (iron deficiency  anemia) 06/19/2014  . Breast cancer, left breast ([place]) 06/19/2014  . 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 ABDOMINAL HYSTERECTOMY     partial  . BREAST EXCISIONAL BIOPSY Left 12/2012   +  . BREAST LUMPECTOMY WITH SENTINEL LYMPH NODE BIOPSY Left 2014  . CHOLECYSTECTOMY    . CYSTOSCOPY W/ URETERAL STENT PLACEMENT Right 01/07/2016   Procedure: CYSTOSCOPY WITH STENT REPLACEMENT;  Surgeon: Hollice Espy, MD;  Location: [facility] ORS;  Service: Urology;  Laterality: Right;  . CYSTOSCOPY WITH RETROGRADE PYELOGRAM, URETEROSCOPY AND STENT PLACEMENT Right 12/19/2015   Procedure: CYSTOSCOPY WITH RETROGRADE PYELOGRAM, URETEROSCOPY AND STENT PLACEMENT;  Surgeon: Ardis Hughs, MD;  Location: [facility] ORS;  Service: Urology;  Laterality: Right;  . URETEROSCOPY WITH HOLMIUM LASER LITHOTRIPSY Right 01/07/2016   Procedure: URETEROSCOPY WITH HOLMIUM LASER LITHOTRIPSY;  Surgeon: Hollice Espy, MD;  Location: [facility] ORS;  Service: Urology;  Laterality: Right;    Prior to Admission medications   Medication Sig Start Date End Date Taking? Authorizing Provider  acetaminophen (TYLENOL) 500 MG tablet Take 500 mg by mouth every 6 (six) hours as needed (for pain.).    Historical Provider, MD  anastrozole (ARIMIDEX) 1 MG tablet Take 1 tablet (1 mg total) by mouth daily. 11/06/15   [name], MD  aspirin EC 81 MG EC tablet Take 1 tablet (81 mg total) by mouth daily. 12/27/15   Demetrios Loll, MD  Camphor-Menthol-Capsicum (  TIGER BALM PAIN RELIEVING) 80-24-16 MG PTCH Place 1 patch onto the skin daily as needed (for knee pain.).    Historical Provider, MD  ergocalciferol (VITAMIN D2) 50000 units capsule Take 1 capsule (50,000 Units total) by mouth once a week. Patient taking differently: Take 50,000 Units by mouth every Monday.  11/06/15   [name], MD  furosemide (LASIX) 40 MG tablet Take 40 mg by mouth daily.    Historical Provider, MD  labetalol (NORMODYNE)  300 MG tablet TAKE 1 TABLET (300 MG TOTAL) BY MOUTH 2 (TWO) TIMES DAILY. 08/09/14   Historical Provider, MD  levothyroxine (SYNTHROID) 88 MCG tablet Take 88 mcg by mouth daily before breakfast.  06/20/14   Historical Provider, MD  losartan-hydrochlorothiazide (HYZAAR) 100-25 MG tablet Take 1 tablet by mouth daily.    Historical Provider, MD  octreotide (SANDOSTATIN LAR) 30 MG injection Inject 30 mg into the muscle every 28 (twenty-eight) days.     Historical Provider, MD  phenazopyridine (PYRIDIUM) 100 MG tablet Take 1 tablet (100 mg total) by mouth 3 (three) times daily with meals. 01/29/16   Dustin Flock, MD  potassium chloride SA (K-DUR,KLOR-CON) 20 MEQ tablet Once a day. 01/11/16   [name], MD  Vancomycin (VANCOCIN) 750-5 MG/150ML-% SOLN Inject 150 mLs (750 mg total) into the vein every 12 (twelve) hours. 01/29/16   Dustin Flock, MD    Allergies Sulfa antibiotics  Family History  Problem Relation Age of Onset  . Breast cancer Mother 54  . Breast cancer Maternal Aunt 68  . Prostate cancer Father     Social History Social History  Substance Use Topics  . Smoking status: Never Smoker  . Smokeless tobacco: Never Used  . Alcohol use No    Review of Systems Constitutional: No fever/chills Eyes: No visual changes. ENT: No sore throat. Cardiovascular: Denies chest pain. Respiratory: Denies shortness of breath. Gastrointestinal: Positive for abdominal pain and nausea, no vomiting.  No diarrhea.  No constipation. Genitourinary: Negative for dysuria. Musculoskeletal: Negative for back pain. Skin: Negative for rash. Neurological: Negative for headaches, focal weakness or numbness.  10-point ROS otherwise negative.  ____________________________________________   PHYSICAL EXAM:  VITAL SIGNS: ED Triage Vitals  Enc Vitals Group     BP 02/07/16 2223 (!) 235/82     Pulse Rate 02/07/16 2223 61     Resp 02/07/16 2223 15     Temp 02/07/16 2223 98.8 F (37.1 C)     Temp  Source 02/07/16 2223 Oral     SpO2 02/07/16 2215 97 %     Weight 02/07/16 2226 195 lb (88.5 kg)     Height 02/07/16 2226 5\' 3"  (1.6 m)     Head Circumference --      Peak Flow --      Pain Score 02/07/16 2226 8     Pain Loc --      Pain Edu? --      Excl. in [place]? --     Constitutional: Alert and oriented. Well appearing and in no acute distress. Eyes: Conjunctivae are normal. PERRL. EOMI. Head: Atraumatic. Mouth/Throat: Mucous membranes are moist.  Oropharynx non-erythematous. Neck: No stridor.   Cardiovascular: Normal rate, regular rhythm. Good peripheral circulation. Grossly normal heart sounds. Respiratory: Normal respiratory effort.  No retractions. Lungs CTAB. Gastrointestinal: Positive for epigastric discomfort with palpation No distention.  Musculoskeletal: No lower extremity tenderness nor edema. No gross deformities of extremities. Neurologic:  Normal speech and language. No gross focal neurologic deficits are appreciated.  Skin:  Skin is warm, dry and intact. No rash noted. Psychiatric: Mood and affect are normal. Speech and behavior are normal.  ____________________________________________   LABS (all labs ordered are listed, but only abnormal results are displayed)  Labs Reviewed  COMPREHENSIVE METABOLIC PANEL - Abnormal; Notable for the following:       Result Value   Chloride 99 (*)    Glucose, Bld 117 (*)    All other components within normal limits  CBC - Abnormal; Notable for the following:    RDW 15.6 (*)    All other components within normal limits  URINALYSIS, COMPLETE (UACMP) WITH MICROSCOPIC - Abnormal; Notable for the following:    Color, Urine YELLOW (*)    APPearance HAZY (*)    Hgb urine dipstick LARGE (*)    Leukocytes, UA MODERATE (*)    Squamous Epithelial / LPF 0-5 (*)    All other components within normal limits  BRAIN NATRIURETIC PEPTIDE - Abnormal; Notable for the following:    B Natriuretic Peptide 160.0 (*)    All other components  within normal limits  URINE CULTURE  LIPASE, BLOOD  TROPONIN I   ____________________________________________  EKG  ED ECG REPORT I, [place] Don[place] Kleve, the attending physician, personally viewed and interpreted this ECG.   Date: 02/08/2016  EKG Time: 12:54 AM  Rate: 64  Rhythm: Normal sinus rhythm  Axis: Normal  Intervals: Normal  ST&T Change: None  ____________________________________________  RADIOLOGY I, [place] Chandani Rogowski, personally viewed and evaluated these images (plain radiographs) as part of my medical decision making, as well as reviewing the written report by the radiologist.  Ct Abdomen Pelvis W Contrast  Result Date: 02/08/2016 CLINICAL DATA:  Acute onset of generalized abdominal pain and 5 pounds of weight gain. Initial encounter. EXAM: CT ABDOMEN AND PELVIS WITH CONTRAST TECHNIQUE: Multidetector CT imaging of the abdomen and pelvis was performed using the standard protocol following bolus administration of intravenous contrast. CONTRAST:  172mL ISOVUE-300 IOPAMIDOL (ISOVUE-300) INJECTION 61% COMPARISON:  CT the abdomen and pelvis performed 12/19/2015 FINDINGS: Lower chest: The visualized lung bases are grossly clear. The visualized portions of the mediastinum are unremarkable. Hepatobiliary: There is a vague heterogeneous 5.0 x 3.3 cm mass within the right hepatic lobe adjacent to the hepatic hilum, with new associated intrahepatic biliary ductal dilatation. A smaller 1.7 cm hypodense mass is seen at the left hepatic lobe. This is concerning for malignancy. Hepatocellular carcinoma or cholangiocarcinoma could have such an appearance. The patient is status post cholecystectomy, with clips noted at the gallbladder fossa. The common bile duct remains normal in caliber. Pancreas: The pancreas is within normal limits. Spleen: A peripherally calcified 3.4 cm splenic cyst is again noted, relatively stable from 2017. Adrenals/Urinary Tract: Minimal right-sided hydronephrosis is again  noted, with stranding about the right ureter and right renal pelvis, concerning for mild ureteritis. Right-sided perinephric stranding is improved from the prior study. A tiny nonobstructing 3 mm stone is noted posteriorly at the right kidney. The left kidney is unremarkable in appearance. The adrenal glands are grossly unremarkable in appearance. Stomach/Bowel: A spiculated retractile mass is again noted at the central small bowel mesentery, with focal calcifications, measuring approximately 7.1 cm in maximal dimension. This is relatively stable from 2017. As before, this remains concerning for a large carcinoid tumor. Visualized small bowel loops are otherwise unremarkable. The appendix is normal in caliber, without evidence of appendicitis. The colon is grossly unremarkable in appearance. The stomach is partially filled with contrast and  is grossly unremarkable in appearance. Vascular/Lymphatic: Scattered calcification is seen along the abdominal aorta and its branches. The abdominal aorta is otherwise grossly unremarkable. The inferior vena cava is grossly unremarkable. No retroperitoneal lymphadenopathy is seen. No pelvic sidewall lymphadenopathy is identified. Reproductive: A likely uterine fibroid is noted, with minimal calcification. A stable 5.4 cm right adnexal cystic focus again may reflect a pedunculated fibroid or ovarian fibrothecoma. No suspicious adnexal masses are seen. Other: No additional soft tissue abnormalities are seen. Musculoskeletal: No acute osseous abnormalities are identified. Vacuum phenomenon is noted along the upper and lower lumbar spine. Facet disease is noted at the lower lumbar spine. The visualized musculature is unremarkable in appearance. IMPRESSION: 1. Vague heterogeneous 5.0 x 3.3 cm mass within the right hepatic lobe, adjacent to the hepatic hilum, with new intrahepatic biliary ductal dilatation. Smaller 1.7 cm hypodense mass at the left hepatic lobe. Findings are concerning  for malignancy, with local metastasis. Hepatocellular carcinoma or cholangiocarcinoma could have such an appearance. Would correlate with lab findings. Dynamic liver protocol MRI or CT is recommended for further evaluation. 2. Minimal right-sided hydronephrosis again noted. Stranding about the right ureter and right renal pelvis is concerning for mild right-sided ureteritis. Perinephric stranding is improved from the prior study. 3. Spiculated 7.1 cm retractile mass again noted at the central small bowel mesentery, relatively stable from 2017 and again concerning for a large carcinoid tumor. 4. Stable 5.4 cm right adnexal cystic focus may reflect a pedunculated fibroid or ovarian fibrothecoma. Uterine fibroid again seen. 5. Peripherally calcified 3.4 cm splenic cyst again noted. 6. Scattered aortic atherosclerosis. Electronically Signed   By: Garald Balding M.D.   On: 02/08/2016 02:26    _____  Procedures    INITIAL IMPRESSION / ASSESSMENT AND PLAN / ED COURSE  Pertinent labs & imaging results that were available during my care of the patient were reviewed by me and considered in my medical decision making (see chart for details).  No clear etiology identified for the patient's abdominal pain in the epigastric region. No pain at this time.   Clinical Course     ____________________________________________  FINAL CLINICAL IMPRESSION(S) / ED DIAGNOSES  Final diagnoses:  Acute cystitis without hematuria  Abdominal pain, unspecified abdominal location     MEDICATIONS GIVEN DURING THIS VISIT:  Medications  furosemide (LASIX) injection 40 mg (not administered)  morphine 2 MG/ML injection 2 mg (2 mg Intravenous Given 02/08/16 0004)  ondansetron (ZOFRAN) injection 4 mg (4 mg Intravenous Given 02/08/16 0004)  iopamidol (ISOVUE-300) 61 % injection 30 mL (30 mLs Oral Contrast Given 02/08/16 0015)  iopamidol (ISOVUE-300) 61 % injection 100 mL (100 mLs Intravenous Contrast Given 02/08/16 0142)      NEW OUTPATIENT MEDICATIONS STARTED DURING THIS VISIT:  New Prescriptions   No medications on file    Modified Medications   No medications on file    Discontinued Medications   No medications on file     Note:  This document was prepared using Dragon voice recognition software and may include unintentional dictation errors.    Gregor Hams, MD 02/08/16 [phone]

## 2016-02-08 NOTE — ED Notes (Signed)
ED Provider at bedside. 

## 2016-02-08 NOTE — Telephone Encounter (Signed)
Went to ER last night and CT was done and showed some spots. Not sure if old or new, Please advise.  Her next fu appt with you is 03/13/16   IMPRESSION: 1. Vague heterogeneous 5.0 x 3.3 cm mass within the right hepatic lobe, adjacent to the hepatic hilum, with new intrahepatic biliary ductal dilatation. Smaller 1.7 cm hypodense mass at the left hepatic lobe. Findings are concerning for malignancy, with local metastasis. Hepatocellular carcinoma or cholangiocarcinoma could have such an appearance. Would correlate with lab findings. Dynamic liver protocol MRI or CT is recommended for further evaluation. 2. Minimal right-sided hydronephrosis again noted. Stranding about the right ureter and right renal pelvis is concerning for mild right-sided ureteritis. Perinephric stranding is improved from the prior study. 3. Spiculated 7.1 cm retractile mass again noted at the central small bowel mesentery, relatively stable from 2017 and again concerning for a large carcinoid tumor. 4. Stable 5.4 cm right adnexal cystic focus may reflect a pedunculated fibroid or ovarian fibrothecoma. Uterine fibroid again seen. 5. Peripherally calcified 3.4 cm splenic cyst again noted. 6. Scattered aortic atherosclerosis.   Electronically Signed   By: Garald Balding M.D.   On: 02/08/2016 02:26

## 2016-02-08 NOTE — Telephone Encounter (Signed)
Informed of new liver lesion and that MD wants to see patient next week and his 1st available is in [facility] Tuesday, Accepts appt at 130 in [place] and asked about her inj on Monday   Per VO Dr Rogue Bussing, appt for [facility] on Monday has been changed til Tuesday in [facility] when she comes in to see MD at [place] informed [name] informed as well and thanked me for taking care of this. I confirmed that she knows where to go on Tuesday

## 2016-02-08 NOTE — Telephone Encounter (Signed)
Spoke with pt daughter, [name], in reference to post stent removal discomfort. Reinforced with [name] this can be normal and measures to relieve discomfort. Made [name] aware Dr. Erlene Quan said no oral abx after IV abx is complete. And f/u with [facility] as scheduled. [name] voiced understanding.

## 2016-02-08 NOTE — Telephone Encounter (Signed)
LMOM

## 2016-02-09 LAB — URINE CULTURE: Culture: NO GROWTH

## 2016-02-11 ENCOUNTER — Inpatient Hospital Stay: Payer: Medicare Other

## 2016-02-12 ENCOUNTER — Ambulatory Visit: Payer: Medicare Other

## 2016-02-12 ENCOUNTER — Inpatient Hospital Stay: Payer: Medicare Other

## 2016-02-12 ENCOUNTER — Inpatient Hospital Stay: Payer: Medicare Other | Attending: Internal Medicine | Admitting: Internal Medicine

## 2016-02-12 ENCOUNTER — Telehealth: Payer: Self-pay | Admitting: *Deleted

## 2016-02-12 VITALS — BP 100/61 | HR 67 | Temp 97.7°F | Wt 193.8 lb

## 2016-02-12 DIAGNOSIS — N189 Chronic kidney disease, unspecified: Secondary | ICD-10-CM | POA: Insufficient documentation

## 2016-02-12 DIAGNOSIS — D509 Iron deficiency anemia, unspecified: Secondary | ICD-10-CM | POA: Diagnosis not present

## 2016-02-12 DIAGNOSIS — C50912 Malignant neoplasm of unspecified site of left female breast: Secondary | ICD-10-CM | POA: Insufficient documentation

## 2016-02-12 DIAGNOSIS — E039 Hypothyroidism, unspecified: Secondary | ICD-10-CM | POA: Diagnosis not present

## 2016-02-12 DIAGNOSIS — Z7982 Long term (current) use of aspirin: Secondary | ICD-10-CM | POA: Diagnosis not present

## 2016-02-12 DIAGNOSIS — C7A019 Malignant carcinoid tumor of the small intestine, unspecified portion: Secondary | ICD-10-CM | POA: Insufficient documentation

## 2016-02-12 DIAGNOSIS — K6389 Other specified diseases of intestine: Secondary | ICD-10-CM

## 2016-02-12 DIAGNOSIS — K769 Liver disease, unspecified: Secondary | ICD-10-CM | POA: Diagnosis not present

## 2016-02-12 DIAGNOSIS — Z86718 Personal history of other venous thrombosis and embolism: Secondary | ICD-10-CM | POA: Diagnosis not present

## 2016-02-12 DIAGNOSIS — Z79811 Long term (current) use of aromatase inhibitors: Secondary | ICD-10-CM | POA: Diagnosis not present

## 2016-02-12 DIAGNOSIS — N1330 Unspecified hydronephrosis: Secondary | ICD-10-CM

## 2016-02-12 DIAGNOSIS — Z87442 Personal history of urinary calculi: Secondary | ICD-10-CM | POA: Diagnosis not present

## 2016-02-12 DIAGNOSIS — I130 Hypertensive heart and chronic kidney disease with heart failure and stage 1 through stage 4 chronic kidney disease, or unspecified chronic kidney disease: Secondary | ICD-10-CM | POA: Insufficient documentation

## 2016-02-12 DIAGNOSIS — Z170 Estrogen receptor positive status [ER+]: Secondary | ICD-10-CM

## 2016-02-12 DIAGNOSIS — Q2730 Arteriovenous malformation, site unspecified: Secondary | ICD-10-CM | POA: Diagnosis not present

## 2016-02-12 DIAGNOSIS — K219 Gastro-esophageal reflux disease without esophagitis: Secondary | ICD-10-CM | POA: Insufficient documentation

## 2016-02-12 DIAGNOSIS — I509 Heart failure, unspecified: Secondary | ICD-10-CM | POA: Diagnosis not present

## 2016-02-12 DIAGNOSIS — I700 Atherosclerosis of aorta: Secondary | ICD-10-CM | POA: Insufficient documentation

## 2016-02-12 DIAGNOSIS — Z7901 Long term (current) use of anticoagulants: Secondary | ICD-10-CM | POA: Diagnosis not present

## 2016-02-12 DIAGNOSIS — E876 Hypokalemia: Secondary | ICD-10-CM | POA: Insufficient documentation

## 2016-02-12 DIAGNOSIS — Z79899 Other long term (current) drug therapy: Secondary | ICD-10-CM | POA: Diagnosis not present

## 2016-02-12 DIAGNOSIS — D734 Cyst of spleen: Secondary | ICD-10-CM | POA: Diagnosis not present

## 2016-02-12 DIAGNOSIS — I10 Essential (primary) hypertension: Secondary | ICD-10-CM | POA: Insufficient documentation

## 2016-02-12 DIAGNOSIS — R197 Diarrhea, unspecified: Secondary | ICD-10-CM | POA: Diagnosis not present

## 2016-02-12 MED ORDER — OCTREOTIDE ACETATE 20 MG IM KIT
20.0000 mg | PACK | Freq: Once | INTRAMUSCULAR | Status: AC
  Administered 2016-02-12: 20 mg via INTRAMUSCULAR

## 2016-02-12 NOTE — Assessment & Plan Note (Signed)
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 Octreotide scan from OCT 2nd  2017 no evidence of progression; however incidental most recent CT scan in the emergency room shows- approximately 5 cm right hepatic lobe lesion along with the stable left-sided liver lesion.  # Continue Octroetide shots monthly.   # "~ 5cm New lesion" on the right hepatic lobe- progression of carcinoid versus new primary.  Will review imaging at the tumor conference- and decide regarding the type of imaging/ ? Need for biopsy.   # Iron deficiency anemia/Hx of AV malformation- Status post Feraheme April 2017- recent hemoglobin is 12.3.    # LEFT BREAST CA STAGE I;  Restart Arimidex.  Clinically no evidence of recurrence.  # Follow-up with me in approximately 1 month/labs;  CBC BMP monthly/shots;Chromogranin A, Urine 5HIAA prior to next visit.

## 2016-02-12 NOTE — Telephone Encounter (Signed)
Daughter called cancer center - spoke with RN.  Daughter expresses that she is "upset that she didn't make it to her mother's md apt today." Pt's son in law came for apt but pt would not allow son in law to come in exam room per daughter. Daughter wanted to know the plan of care.  RN spoke with daughter- reassured pt's daughter that md is proceeding with Sandostatin injections on monthly basis. Md will see patient back in 1 month with the Sandostatin. Explained that pt recently was evaluated in ED with a ct scan which showed a new liver lesion in right lobe of liver. Explained that md will present her mom's case with the radiologist, oncology and pathologist on Thursday this week at a case conference to determine if patient needs a biopsy of this liver lesion.  Daughter expressed that md or nursing team call her back regarding the plan of care.  (cell: [phone]; work: [phone]) should the plan of care changed. I explained that I would update her on the plan as we know more.

## 2016-02-12 NOTE — Progress Notes (Signed)
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] s/p Lumpec & RT; ER/PR > 90%; Her2 Neu-NEG; Arimidex; MAY 2016- Mammo-NEG; May 1st HOLD Arimidex sec to myalgias  # IDA s/p IV iron; last March 2014; [place] [Dr.Elliot; Jan 2016] colon angiotele- s/p Argon laser; April 2017- [facility]  # DVT [taken off eliquis DEC 2017]; NOV -DEC 2017 CHF/ UTI with sepsis- stone extracted [Dr.Brandon]  # Thyroid nodule- [facility] s/p Bx [NOV 2016]/ right adnexal mass [stable since 2010]; hard of hearing     Carcinoid tumor of small intestine, malignant ([place])   10/08/2015 Initial Diagnosis    Carcinoid tumor of small intestine, malignant ([place])       INTERVAL HISTORY: Patient is a poor historian/hard of hearing  A very pleasant 76 year old female patient with above history of breast cancer and also mesenteric mass/small bowel carcinoid is here for follow-up- given the recent diagnosis of a "new liver mass".   Patient was in the emergency room for abdominal cramps- CT of the abdomen and pelvis showed approximately 5 cm sized right lobe of liver lesion; which was new.   Patient denies any swelling in the legs. Patient's breathing is improved. Patient continues to have intermittent diarrhea chronic-1-2 loose stools. No wheezing. No flushing.. Not any worse. Denies any blood in stools or black colored  stools. Chronic mild fatigue.  REVIEW OF SYSTEMS:  A complete 10 point review of system is done which is negative except mentioned above/history of present illness.   PAST MEDICAL HISTORY :  Past Medical History:  Diagnosis Date  . Aromatase inhibitor use   . Breast cancer, left breast ([place]) 06/19/2014  . CHF (congestive heart failure) (HCC)    recent sepsis  . Chronic kidney disease   . Dizziness   . Dyspnea    recently while admitted  . GERD (gastroesophageal reflux disease)   . History of hiatal hernia   . History of kidney stones   . HOH (hard of hearing)    severe  . Hypertension   . Hypothyroidism   . Leg DVT (deep venous thromboembolism), acute ([place]) 07/2015   left leg after fracture  . 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 Pain    chest pain while admitted  . Thyroid disease     PAST SURGICAL HISTORY :   Past Surgical History:  Procedure Laterality Date  . ABDOMINAL HYSTERECTOMY     partial  . BREAST EXCISIONAL BIOPSY Left 12/2012   +  . BREAST LUMPECTOMY WITH SENTINEL LYMPH NODE BIOPSY Left 2014  . CHOLECYSTECTOMY    . CYSTOSCOPY W/ URETERAL STENT PLACEMENT Right 01/07/2016   Procedure: CYSTOSCOPY WITH STENT REPLACEMENT;  Surgeon: Hollice Espy, MD;  Location: [facility] ORS;  Service: Urology;  Laterality: Right;  . CYSTOSCOPY WITH RETROGRADE PYELOGRAM, URETEROSCOPY AND STENT PLACEMENT Right 12/19/2015   Procedure: CYSTOSCOPY WITH RETROGRADE PYELOGRAM, URETEROSCOPY AND STENT PLACEMENT;  Surgeon: Ardis Hughs, MD;  Location: [facility] ORS;  Service: Urology;  Laterality: Right;  . URETEROSCOPY WITH HOLMIUM LASER LITHOTRIPSY Right 01/07/2016   Procedure: URETEROSCOPY WITH HOLMIUM LASER LITHOTRIPSY;  Surgeon: Hollice Espy, MD;  Location: [facility] ORS;  Service: Urology;  Laterality: Right;    FAMILY HISTORY :   Family History  Problem Relation Age of Onset  . Breast cancer Mother 58  . Breast cancer Maternal Aunt 68  . Prostate cancer Father     SOCIAL HISTORY:   Social  History  Substance Use Topics  . Smoking status: Never Smoker  . Smokeless tobacco: Never Used  . Alcohol use No    ALLERGIES:  is allergic to sulfa antibiotics.  MEDICATIONS:  Current Outpatient Prescriptions  Medication Sig Dispense Refill  . acetaminophen (TYLENOL) 500 MG tablet Take 500 mg by mouth every 6 (six) hours as needed (for pain.).    Marland Kitchen anastrozole (ARIMIDEX) 1 MG tablet Take 1 tablet (1 mg total) by mouth daily. 30 tablet 6  . aspirin EC 81 MG EC tablet Take 1 tablet (81 mg total) by mouth daily. 30 tablet 2  . Camphor-Menthol-Capsicum (TIGER BALM PAIN RELIEVING) 80-24-16 MG PTCH Place 1 patch onto the skin daily as needed (for knee pain.).    Marland Kitchen ergocalciferol (VITAMIN D2) 50000 units capsule Take 1 capsule (50,000 Units total) by mouth once a week. (Patient taking differently: Take 50,000 Units by mouth every Monday. ) 12 capsule 1  . furosemide (LASIX) 40 MG tablet Take 40 mg by mouth daily.    Marland Kitchen labetalol (NORMODYNE) 300 MG tablet TAKE 1 TABLET (300 MG TOTAL) BY MOUTH 2 (TWO) TIMES DAILY.    Marland Kitchen levothyroxine (SYNTHROID) 88 MCG tablet Take 88 mcg by mouth daily before breakfast.     . losartan-hydrochlorothiazide (HYZAAR) 100-25 MG tablet Take 1 tablet by mouth daily.    Marland Kitchen octreotide (SANDOSTATIN LAR) 30 MG injection Inject 30 mg into the muscle every 28 (twenty-eight) days.     Marland Kitchen oxybutynin (DITROPAN) 5 MG tablet Take 5 mg by mouth every 8 (eight) hours as needed.  0  . phenazopyridine (PYRIDIUM) 100 MG tablet Take 1 tablet (100 mg total) by mouth 3 (three) times daily with meals. 10 tablet 0  . potassium chloride SA (K-DUR,KLOR-CON) 20 MEQ tablet Once a day. 30 tablet 3   No current facility-administered medications for this visit.    Facility-Administered Medications Ordered in Other Visits  Medication Dose Route Frequency Provider Last Rate Last Dose  . octreotide (SANDOSTATIN LAR) IM injection 20 mg  20 mg Intramuscular Once [name], MD         PHYSICAL EXAMINATION: ECOG PERFORMANCE STATUS: 0 - Asymptomatic  BP 100/61 (BP Location: Right Arm, Patient Position: Sitting)   Pulse 67   Temp 97.7 F (36.5 C) (Tympanic)   Wt 193 lb 12.6 oz (87.9 kg)   BMI 34.33 kg/m   Filed Weights   02/12/16 1348  Weight: 193 lb 12.6 oz (87.9 kg)   GENERAL: Well-nourished well-developed; Alert, no distress and comfortable. Alone; hard of hearing.She is in a wheelchair. Alone.  EYES: no pallor or icterus OROPHARYNX: no thrush or ulceration; good dentition  NECK: supple, no masses felt LYMPH: no palpable lymphadenopathy in the cervical, axillary or inguinal regions LUNGS: clear to auscultation and No wheeze or crackles HEART/CVS: regular rate & rhythm and no murmurs; No lower extremity edema; left foot in a brace. ABDOMEN:abdomen soft, non-tender and normal bowel sounds Musculoskeletal:no cyanosis of digits and no clubbing  PSYCH: alert & oriented x 3 with fluent  speech NEURO: no focal motor/sensory deficits SKIN: no rashes or significant lesions  LABORATORY DATA:  I have reviewed the data as listed    Component Value Date/Time   NA 138 02/07/2016 2237   NA 137 07/12/2013 0841   K 3.5 02/07/2016 2237   K 4.7 07/12/2013 0841   CL 99 (L) 02/07/2016 2237   CL 107 07/12/2013 0841   CO2 28 02/07/2016 2237   CO2 20 (L) 07/12/2013 0841   GLUCOSE 117 (H) 02/07/2016 2237   GLUCOSE 122 (H) 07/12/2013 0841   BUN 15 02/07/2016 2237   BUN 18 07/12/2013 0841   CREATININE 0.85 02/07/2016 2237   CREATININE 0.64 05/22/2014 1111   CALCIUM 9.4 02/07/2016 2237   CALCIUM 9.2 07/12/2013 0841   PROT 7.4 02/07/2016 2237   PROT 7.0 05/22/2014 1111   ALBUMIN 3.8 02/07/2016 2237   ALBUMIN 4.2 05/22/2014 1111   AST 30 02/07/2016 2237   AST 29 05/22/2014 1111   ALT 16 02/07/2016 2237   ALT 17 05/22/2014 1111   ALKPHOS 103 02/07/2016 2237   ALKPHOS 95 05/22/2014 1111   BILITOT 0.7 02/07/2016 2237   BILITOT 0.8 05/22/2014 1111   GFRNONAA  >60 02/07/2016 2237   GFRNONAA >60 05/22/2014 1111   GFRAA >60 02/07/2016 2237   GFRAA >60 05/22/2014 1111    No results found for: SPEP, UPEP  Lab Results  Component Value Date   WBC 10.4 02/07/2016   NEUTROABS 5.3 01/25/2016   HGB 12.3 02/07/2016   HCT 36.0 02/07/2016   MCV 83.3 02/07/2016   PLT 236 02/07/2016      Chemistry      Component Value Date/Time   NA 138 02/07/2016 2237   NA 137 07/12/2013 0841   K 3.5 02/07/2016 2237   K 4.7 07/12/2013 0841   CL 99 (L) 02/07/2016 2237   CL 107 07/12/2013 0841   CO2 28 02/07/2016 2237   CO2 20 (L) 07/12/2013 0841   BUN 15 02/07/2016 2237   BUN 18 07/12/2013 0841   CREATININE 0.85 02/07/2016 2237   CREATININE 0.64 05/22/2014 1111      Component Value Date/Time   CALCIUM 9.4 02/07/2016 2237   CALCIUM 9.2 07/12/2013 0841   ALKPHOS 103 02/07/2016 2237   ALKPHOS 95 05/22/2014 1111   AST 30 02/07/2016 2237   AST 29 05/22/2014 1111   ALT 16 02/07/2016 2237   ALT 17 05/22/2014 1111   BILITOT 0.7 02/07/2016 2237   BILITOT 0.8 05/22/2014 1111       IMPRESSION: 1. Vague heterogeneous 5.0 x 3.3 cm mass within the right hepatic lobe, adjacent to the hepatic hilum, with new intrahepatic biliary ductal dilatation. Smaller 1.7 cm hypodense mass at the left hepatic lobe. Findings are concerning for malignancy, with local metastasis. Hepatocellular carcinoma or cholangiocarcinoma could have such an appearance. Would correlate with lab findings. Dynamic liver protocol MRI or CT is recommended for further evaluation. 2. Minimal right-sided hydronephrosis again noted. Stranding about the right ureter and right renal pelvis is concerning for mild right-sided ureteritis. Perinephric stranding is improved from the prior study. 3. Spiculated 7.1 cm retractile mass again noted at the central small bowel mesentery, relatively stable from 2017 and again concerning for a large carcinoid tumor. 4. Stable 5.4 cm right adnexal cystic  focus may reflect a pedunculated fibroid or ovarian fibrothecoma. Uterine fibroid again seen. 5. Peripherally calcified 3.4 cm splenic cyst again noted. 6. Scattered aortic atherosclerosis.   Electronically Signed   By: Jacqulynn Cadet  Chang M.D.   On: 02/08/2016 02:26   ASSESSMENT & PLAN:  Carcinoid tumor of small intestine, malignant ([place]) # 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 Octreotide scan from OCT 2nd  2017 no evidence of progression; however incidental most recent CT scan in the emergency room shows- approximately 5 cm right hepatic lobe lesion along with the stable left-sided liver lesion.  # Continue Octroetide shots monthly.   # "~ 5cm New lesion" on the right hepatic lobe- progression of carcinoid versus new primary.  Will review imaging at the tumor conference- and decide regarding the type of imaging/ ? Need for biopsy.   # Iron deficiency anemia/Hx of AV malformation- Status post Feraheme April 2017- recent hemoglobin is 12.3.    # LEFT BREAST CA STAGE I;  Restart Arimidex.  Clinically no evidence of recurrence.  # Follow-up with me in approximately 1 month/labs;  CBC BMP monthly/shots;Chromogranin A, Urine 5HIAA prior to next visit.       [name], MD 02/12/2016 2:11 PM

## 2016-02-12 NOTE — Progress Notes (Signed)
Patient here today for follow up. Patient c/o abdominal pain

## 2016-02-13 ENCOUNTER — Ambulatory Visit: Payer: Medicare Other | Admitting: Family

## 2016-02-13 ENCOUNTER — Other Ambulatory Visit: Payer: Medicare Other

## 2016-02-21 ENCOUNTER — Inpatient Hospital Stay: Payer: Medicare Other

## 2016-02-21 ENCOUNTER — Ambulatory Visit: Payer: Medicare Other

## 2016-02-22 ENCOUNTER — Ambulatory Visit: Payer: Medicare Other | Attending: Family | Admitting: Family

## 2016-02-22 ENCOUNTER — Encounter: Payer: Self-pay | Admitting: Family

## 2016-02-22 VITALS — BP 155/62 | HR 59 | Resp 18 | Ht 65.0 in | Wt 195.5 lb

## 2016-02-22 DIAGNOSIS — Z7982 Long term (current) use of aspirin: Secondary | ICD-10-CM | POA: Diagnosis not present

## 2016-02-22 DIAGNOSIS — Z8042 Family history of malignant neoplasm of prostate: Secondary | ICD-10-CM | POA: Insufficient documentation

## 2016-02-22 DIAGNOSIS — Z853 Personal history of malignant neoplasm of breast: Secondary | ICD-10-CM | POA: Insufficient documentation

## 2016-02-22 DIAGNOSIS — Z882 Allergy status to sulfonamides status: Secondary | ICD-10-CM | POA: Insufficient documentation

## 2016-02-22 DIAGNOSIS — E039 Hypothyroidism, unspecified: Secondary | ICD-10-CM | POA: Diagnosis not present

## 2016-02-22 DIAGNOSIS — I5032 Chronic diastolic (congestive) heart failure: Secondary | ICD-10-CM | POA: Diagnosis present

## 2016-02-22 DIAGNOSIS — Z960 Presence of urogenital implants: Secondary | ICD-10-CM | POA: Diagnosis not present

## 2016-02-22 DIAGNOSIS — Z803 Family history of malignant neoplasm of breast: Secondary | ICD-10-CM | POA: Insufficient documentation

## 2016-02-22 DIAGNOSIS — A419 Sepsis, unspecified organism: Secondary | ICD-10-CM

## 2016-02-22 DIAGNOSIS — Z86718 Personal history of other venous thrombosis and embolism: Secondary | ICD-10-CM | POA: Insufficient documentation

## 2016-02-22 DIAGNOSIS — H9190 Unspecified hearing loss, unspecified ear: Secondary | ICD-10-CM | POA: Diagnosis not present

## 2016-02-22 DIAGNOSIS — Z9049 Acquired absence of other specified parts of digestive tract: Secondary | ICD-10-CM | POA: Diagnosis not present

## 2016-02-22 DIAGNOSIS — I130 Hypertensive heart and chronic kidney disease with heart failure and stage 1 through stage 4 chronic kidney disease, or unspecified chronic kidney disease: Secondary | ICD-10-CM | POA: Insufficient documentation

## 2016-02-22 DIAGNOSIS — N189 Chronic kidney disease, unspecified: Secondary | ICD-10-CM | POA: Diagnosis not present

## 2016-02-22 DIAGNOSIS — Z90710 Acquired absence of both cervix and uterus: Secondary | ICD-10-CM | POA: Insufficient documentation

## 2016-02-22 DIAGNOSIS — K449 Diaphragmatic hernia without obstruction or gangrene: Secondary | ICD-10-CM | POA: Insufficient documentation

## 2016-02-22 DIAGNOSIS — Z87442 Personal history of urinary calculi: Secondary | ICD-10-CM | POA: Insufficient documentation

## 2016-02-22 DIAGNOSIS — Z1624 Resistance to multiple antibiotics: Secondary | ICD-10-CM | POA: Diagnosis not present

## 2016-02-22 DIAGNOSIS — K219 Gastro-esophageal reflux disease without esophagitis: Secondary | ICD-10-CM | POA: Insufficient documentation

## 2016-02-22 DIAGNOSIS — Z8619 Personal history of other infectious and parasitic diseases: Secondary | ICD-10-CM | POA: Diagnosis not present

## 2016-02-22 DIAGNOSIS — I10 Essential (primary) hypertension: Secondary | ICD-10-CM

## 2016-02-22 NOTE — Patient Instructions (Signed)
Continue weighing daily and call for an overnight weight gain of > 2 pounds or a weekly weight gain of >5 pounds. 

## 2016-02-22 NOTE — Progress Notes (Signed)
Patient ID: Emma [name], female    DOB: 03/24/1940, 76 y.o.   MRN: [id number]  HPI  Emma [name] is a 76 y/o female with a history of hypothyroidism, DVT, HTN, HOH, kidney stones, hiatal hernia, GERD, CKD, left breast cancer, sepsis and chronic heart failure.  Last echo was done 12/25/15 which showed an EF of 50-55% without any valvular regurgitation.   Was in the ED on 02/07/16 for acute cystitis and was treated and released. She was last admitted on 01/25/16 due to multidrug resistant infection in urinary system. Developed acute HF while hospitalized and was treated with IV diuretics with resolution of her symptoms. ID consult was obtained with recommendation of 14 days of IV antibiotics at discharge via a PICC line. Previous admission was on 12/24/15 with acute on chronic heart failure. Treated with IV diuretics and BP was treated.   She presents today for her follow-up visit with fatigue with minimal exertion. Denies any swelling in her legs. Already weighing daily. PICC line has been removed. She is walking more with her walker and was able to walk into the office instead of coming in a wheelchair. Very hard of hearing.   Past Medical History:  Diagnosis Date  . Aromatase inhibitor use   . Breast cancer, left breast ([place]) 06/19/2014  . CHF (congestive heart failure) (HCC)    recent sepsis  . Chronic kidney disease   . Dizziness   . Dyspnea    recently while admitted  . GERD (gastroesophageal reflux disease)   . History of hiatal hernia   . History of kidney stones   . HOH (hard of hearing)    severe  . Hypertension   . Hypothyroidism   . Leg DVT (deep venous thromboembolism), acute ([place]) 07/2015   left leg after fracture  . 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 Pain    chest pain while admitted  . Thyroid disease    Past Surgical History:  Procedure Laterality Date  . ABDOMINAL HYSTERECTOMY     partial  . BREAST EXCISIONAL BIOPSY Left 12/2012   +  . BREAST LUMPECTOMY WITH  SENTINEL LYMPH NODE BIOPSY Left 2014  . CHOLECYSTECTOMY    . CYSTOSCOPY W/ URETERAL STENT PLACEMENT Right 01/07/2016   Procedure: CYSTOSCOPY WITH STENT REPLACEMENT;  Surgeon: Hollice Espy, MD;  Location: [facility];  Service: Urology;  Laterality: Right;  . CYSTOSCOPY WITH RETROGRADE PYELOGRAM, URETEROSCOPY AND STENT PLACEMENT Right 12/19/2015   Procedure: CYSTOSCOPY WITH RETROGRADE PYELOGRAM, URETEROSCOPY AND STENT PLACEMENT;  Surgeon: Ardis Hughs, MD;  Location: [facility];  Service: Urology;  Laterality: Right;  . URETEROSCOPY WITH HOLMIUM LASER LITHOTRIPSY Right 01/07/2016   Procedure: URETEROSCOPY WITH HOLMIUM LASER LITHOTRIPSY;  Surgeon: Hollice Espy, MD;  Location: [facility];  Service: Urology;  Laterality: Right;   Family History  Problem Relation Age of Onset  . Breast cancer Mother 46  . Breast cancer Maternal Aunt 68  . Prostate cancer Father    Social History  Substance Use Topics  . Smoking status: Never Smoker  . Smokeless tobacco: Never Used  . Alcohol use No   Allergies  Allergen Reactions  . Sulfa Antibiotics     Other reaction(s): Kidney Disorder Other reaction(s): Kidney Disorder Other reaction(s): Kidney Disorder   Prior to Admission medications   Medication Sig Start Date End Date Taking? Authorizing Provider  acetaminophen (TYLENOL) 500 MG tablet Take 500 mg by mouth every 6 (six) hours as needed (for pain.).   Yes Historical Provider, MD  anastrozole (ARIMIDEX) 1 MG tablet Take 1 tablet (1 mg total) by mouth daily. 11/06/15  Yes [name], MD  aspirin EC 81 MG EC tablet Take 1 tablet (81 mg total) by mouth daily. 12/27/15  Yes Demetrios Loll, MD  Camphor-Menthol-Capsicum (TIGER BALM PAIN RELIEVING) 80-24-16 MG PTCH Place 1 patch onto the skin daily as needed (for knee pain.).   Yes Historical Provider, MD  ergocalciferol (VITAMIN D2) 50000 units capsule Take 1 capsule (50,000 Units total) by mouth once a week. Patient taking differently: Take  50,000 Units by mouth every Monday.  11/06/15  Yes [name], MD  furosemide (LASIX) 40 MG tablet Take 40 mg by mouth daily.   Yes Historical Provider, MD  labetalol (NORMODYNE) 300 MG tablet TAKE 1 TABLET (300 MG TOTAL) BY MOUTH 2 (TWO) TIMES DAILY. 08/09/14  Yes Historical Provider, MD  levothyroxine (SYNTHROID) 88 MCG tablet Take 88 mcg by mouth daily before breakfast.  06/20/14  Yes Historical Provider, MD  losartan-hydrochlorothiazide (HYZAAR) 100-25 MG tablet Take 1 tablet by mouth daily.   Yes Historical Provider, MD  octreotide (SANDOSTATIN LAR) 30 MG injection Inject 30 mg into the muscle every 28 (twenty-eight) days.    Yes Historical Provider, MD  phenazopyridine (PYRIDIUM) 100 MG tablet Take 1 tablet (100 mg total) by mouth 3 (three) times daily with meals. 01/29/16  Yes Dustin Flock, MD  potassium chloride SA (K-DUR,KLOR-CON) 20 MEQ tablet Once a day. 01/11/16  Yes [name], MD      Review of Systems  Constitutional: Positive for fatigue. Negative for appetite change.  HENT: Positive for hearing loss and sore throat. Negative for congestion and postnasal drip.   Eyes: Negative.   Respiratory: Negative for chest tightness and shortness of breath.   Cardiovascular: Negative for chest pain, palpitations and leg swelling.  Gastrointestinal: Negative for abdominal distention and abdominal pain.  Endocrine: Negative.   Genitourinary: Negative.   Musculoskeletal: Positive for arthralgias (both knees hurt). Negative for neck pain.  Skin: Negative.   Allergic/Immunologic: Negative.   Neurological: Positive for light-headedness (at times with sudden position changes). Negative for dizziness.  Hematological: Negative for adenopathy. Does not bruise/bleed easily.  Psychiatric/Behavioral: Negative for dysphoric mood, sleep disturbance (sleeping on 1 pillow) and suicidal ideas. The patient is not nervous/anxious.    Vitals:   02/22/16 1239  BP: (!) 155/62  Pulse: (!)  59  Resp: 18  SpO2: 98%  Weight: 195 lb 8 oz (88.7 kg)  Height: 5\' 5"  (1.651 m)   Wt Readings from Last 3 Encounters:  02/22/16 195 lb 8 oz (88.7 kg)  02/12/16 193 lb 12.6 oz (87.9 kg)  02/07/16 195 lb (88.5 kg)   Lab Results  Component Value Date   CREATININE 0.85 02/07/2016   CREATININE 0.79 01/29/2016   CREATININE 0.81 01/27/2016   Physical Exam  Constitutional: She is oriented to person, place, and time. She appears well-developed and well-nourished.  HENT:  Head: Normocephalic and atraumatic.  Right Ear: Decreased hearing is noted.  Left Ear: Decreased hearing is noted.  Eyes: Conjunctivae are normal. Pupils are equal, round, and reactive to light.  Neck: Normal range of motion. Neck supple. No JVD present.  Cardiovascular: Regular rhythm.  Bradycardia present.   Pulmonary/Chest: Effort normal. She has no wheezes. She has no rales.  Abdominal: Soft. She exhibits no distension. There is no tenderness.  Musculoskeletal: She exhibits no edema or tenderness.  Neurological: She is alert and oriented to person, place, and time.  Skin: Skin is warm and dry.  Psychiatric: She has a normal mood and affect. Her behavior is normal. Thought content normal.  Nursing note and vitals reviewed.   Assessment & Plan:  1: Chronic heart failure with preserved ejection fraction- - NYHA class II - euvolemic - already weighing daily. Instructed to call for an overnight weight gain of >2 pounds or a weekly weight gain of >5 pounds - not adding salt to her food. Currently living with her daughter & son-in-law who is doing the shopping/cooking. Reading food labels for sodium content to stay under 2000mg  sodium daily.  - received flu vaccine for this season - sees her cardiologist ([place]) 04/01/16  2: HTN- - BP looks good today - last saw her PCP Kary Kos) on 01/14/16  3: Sepsis- - PICC line removed - last saw ID Ola Spurr) 02/06/16   Return in 3 months or sooner for any  questions/problems before then

## 2016-02-26 ENCOUNTER — Other Ambulatory Visit: Payer: Self-pay | Admitting: *Deleted

## 2016-02-26 ENCOUNTER — Other Ambulatory Visit: Payer: Self-pay | Admitting: Internal Medicine

## 2016-02-26 ENCOUNTER — Telehealth: Payer: Self-pay | Admitting: *Deleted

## 2016-02-26 DIAGNOSIS — C7A019 Malignant carcinoid tumor of the small intestine, unspecified portion: Secondary | ICD-10-CM

## 2016-02-26 DIAGNOSIS — K769 Liver disease, unspecified: Secondary | ICD-10-CM

## 2016-02-26 NOTE — Telephone Encounter (Signed)
Left vm for patient for patient to return my phone call at [phone] to discuss apts that Dr. Rogue Bussing would like to set up for her.  When patient returns my phone call, I will let pt know the below apt information.    Lab only 02/29/2016 @ 11am  [place] Bx 03/04/16 @ 11am (arrival 10am

## 2016-02-26 NOTE — Telephone Encounter (Signed)
Spoke with daughter. bx information given to patient's daughter. Pt not at home at this time. Staying with daughter.

## 2016-02-26 NOTE — Telephone Encounter (Signed)
Contacted daughter- [name] -her vm bx was not set up. Unable to leave msg - 1026am.  no answer on home phone for patient. Phone ringing busy at 1026 am  Need to set up u/s guided liver biopsy per v/o Dr. Aundria Mems to contact patient to discuss the reason for the biopsy. Will also need labs prior to biopsy. msg sent to scheduling to proceed with scheduling apt and I will continue to reach patient with apts for biopsy and reason for bx.

## 2016-02-29 ENCOUNTER — Inpatient Hospital Stay: Payer: Medicare Other | Attending: Internal Medicine

## 2016-02-29 DIAGNOSIS — I130 Hypertensive heart and chronic kidney disease with heart failure and stage 1 through stage 4 chronic kidney disease, or unspecified chronic kidney disease: Secondary | ICD-10-CM | POA: Insufficient documentation

## 2016-02-29 DIAGNOSIS — Q2730 Arteriovenous malformation, site unspecified: Secondary | ICD-10-CM | POA: Insufficient documentation

## 2016-02-29 DIAGNOSIS — I509 Heart failure, unspecified: Secondary | ICD-10-CM | POA: Diagnosis not present

## 2016-02-29 DIAGNOSIS — N390 Urinary tract infection, site not specified: Secondary | ICD-10-CM | POA: Insufficient documentation

## 2016-02-29 DIAGNOSIS — Z87442 Personal history of urinary calculi: Secondary | ICD-10-CM | POA: Insufficient documentation

## 2016-02-29 DIAGNOSIS — E875 Hyperkalemia: Secondary | ICD-10-CM | POA: Diagnosis not present

## 2016-02-29 DIAGNOSIS — C50912 Malignant neoplasm of unspecified site of left female breast: Secondary | ICD-10-CM | POA: Insufficient documentation

## 2016-02-29 DIAGNOSIS — Z79811 Long term (current) use of aromatase inhibitors: Secondary | ICD-10-CM | POA: Diagnosis not present

## 2016-02-29 DIAGNOSIS — D509 Iron deficiency anemia, unspecified: Secondary | ICD-10-CM | POA: Insufficient documentation

## 2016-02-29 DIAGNOSIS — K449 Diaphragmatic hernia without obstruction or gangrene: Secondary | ICD-10-CM | POA: Insufficient documentation

## 2016-02-29 DIAGNOSIS — Z7982 Long term (current) use of aspirin: Secondary | ICD-10-CM | POA: Insufficient documentation

## 2016-02-29 DIAGNOSIS — C7A019 Malignant carcinoid tumor of the small intestine, unspecified portion: Secondary | ICD-10-CM | POA: Insufficient documentation

## 2016-02-29 DIAGNOSIS — K219 Gastro-esophageal reflux disease without esophagitis: Secondary | ICD-10-CM | POA: Diagnosis not present

## 2016-02-29 DIAGNOSIS — Z170 Estrogen receptor positive status [ER+]: Secondary | ICD-10-CM | POA: Insufficient documentation

## 2016-02-29 DIAGNOSIS — Z79899 Other long term (current) drug therapy: Secondary | ICD-10-CM | POA: Diagnosis not present

## 2016-02-29 DIAGNOSIS — E039 Hypothyroidism, unspecified: Secondary | ICD-10-CM | POA: Diagnosis not present

## 2016-02-29 DIAGNOSIS — Z86718 Personal history of other venous thrombosis and embolism: Secondary | ICD-10-CM | POA: Diagnosis not present

## 2016-02-29 DIAGNOSIS — C787 Secondary malignant neoplasm of liver and intrahepatic bile duct: Secondary | ICD-10-CM | POA: Diagnosis not present

## 2016-02-29 DIAGNOSIS — K769 Liver disease, unspecified: Secondary | ICD-10-CM

## 2016-02-29 DIAGNOSIS — N189 Chronic kidney disease, unspecified: Secondary | ICD-10-CM | POA: Insufficient documentation

## 2016-02-29 LAB — CBC WITH DIFFERENTIAL/PLATELET
BASOS ABS: 0.1 10*3/uL (ref 0–0.1)
BASOS PCT: 1 %
EOS ABS: 0.3 10*3/uL (ref 0–0.7)
EOS PCT: 5 %
HCT: 36.2 % (ref 35.0–47.0)
Hemoglobin: 12.1 g/dL (ref 12.0–16.0)
Lymphocytes Relative: 10 %
Lymphs Abs: 0.7 10*3/uL — ABNORMAL LOW (ref 1.0–3.6)
MCH: 28.2 pg (ref 26.0–34.0)
MCHC: 33.4 g/dL (ref 32.0–36.0)
MCV: 84.3 fL (ref 80.0–100.0)
Monocytes Absolute: 0.8 10*3/uL (ref 0.2–0.9)
Monocytes Relative: 11 %
Neutro Abs: 5.1 10*3/uL (ref 1.4–6.5)
Neutrophils Relative %: 73 %
PLATELETS: 214 10*3/uL (ref 150–440)
RBC: 4.29 MIL/uL (ref 3.80–5.20)
RDW: 16 % — ABNORMAL HIGH (ref 11.5–14.5)
WBC: 6.9 10*3/uL (ref 3.6–11.0)

## 2016-02-29 LAB — COMPREHENSIVE METABOLIC PANEL
ALK PHOS: 92 U/L (ref 38–126)
ALT: 13 U/L — ABNORMAL LOW (ref 14–54)
ANION GAP: 8 (ref 5–15)
AST: 27 U/L (ref 15–41)
Albumin: 3.9 g/dL (ref 3.5–5.0)
BUN: 20 mg/dL (ref 6–20)
CHLORIDE: 101 mmol/L (ref 101–111)
CO2: 29 mmol/L (ref 22–32)
Calcium: 9.4 mg/dL (ref 8.9–10.3)
Creatinine, Ser: 1.02 mg/dL — ABNORMAL HIGH (ref 0.44–1.00)
GFR calc non Af Amer: 52 mL/min — ABNORMAL LOW (ref >60)
Glucose, Bld: 132 mg/dL — ABNORMAL HIGH (ref 65–99)
Potassium: 3.3 mmol/L — ABNORMAL LOW (ref 3.5–5.1)
SODIUM: 138 mmol/L (ref 135–145)
Total Bilirubin: 0.8 mg/dL (ref 0.3–1.2)
Total Protein: 7.2 g/dL (ref 6.5–8.1)

## 2016-02-29 LAB — PROTIME-INR
INR: 1.06
Prothrombin Time: 13.8 seconds (ref 11.4–15.2)

## 2016-02-29 LAB — APTT: APTT: 28 s (ref 24–36)

## 2016-03-03 ENCOUNTER — Ambulatory Visit
Admission: RE | Admit: 2016-03-03 | Discharge: 2016-03-03 | Disposition: A | Discharge status: Home / Self Care | Payer: Medicare Other | Source: Ambulatory Visit | Attending: Urology | Admitting: Urology

## 2016-03-03 ENCOUNTER — Other Ambulatory Visit: Payer: Self-pay | Admitting: Radiology

## 2016-03-03 DIAGNOSIS — N289 Disorder of kidney and ureter, unspecified: Secondary | ICD-10-CM | POA: Insufficient documentation

## 2016-03-03 DIAGNOSIS — N201 Calculus of ureter: Secondary | ICD-10-CM

## 2016-03-04 ENCOUNTER — Ambulatory Visit
Admission: RE | Admit: 2016-03-04 | Discharge: 2016-03-04 | Disposition: A | Discharge status: Home / Self Care | Payer: Medicare Other | Source: Ambulatory Visit | Attending: Internal Medicine | Admitting: Internal Medicine

## 2016-03-04 DIAGNOSIS — K219 Gastro-esophageal reflux disease without esophagitis: Secondary | ICD-10-CM | POA: Diagnosis not present

## 2016-03-04 DIAGNOSIS — Z9049 Acquired absence of other specified parts of digestive tract: Secondary | ICD-10-CM | POA: Insufficient documentation

## 2016-03-04 DIAGNOSIS — I130 Hypertensive heart and chronic kidney disease with heart failure and stage 1 through stage 4 chronic kidney disease, or unspecified chronic kidney disease: Secondary | ICD-10-CM | POA: Insufficient documentation

## 2016-03-04 DIAGNOSIS — C7A019 Malignant carcinoid tumor of the small intestine, unspecified portion: Secondary | ICD-10-CM | POA: Diagnosis not present

## 2016-03-04 DIAGNOSIS — Z86718 Personal history of other venous thrombosis and embolism: Secondary | ICD-10-CM | POA: Insufficient documentation

## 2016-03-04 DIAGNOSIS — Z90710 Acquired absence of both cervix and uterus: Secondary | ICD-10-CM | POA: Insufficient documentation

## 2016-03-04 DIAGNOSIS — Z87442 Personal history of urinary calculi: Secondary | ICD-10-CM | POA: Diagnosis not present

## 2016-03-04 DIAGNOSIS — Z853 Personal history of malignant neoplasm of breast: Secondary | ICD-10-CM | POA: Diagnosis not present

## 2016-03-04 DIAGNOSIS — E039 Hypothyroidism, unspecified: Secondary | ICD-10-CM | POA: Insufficient documentation

## 2016-03-04 DIAGNOSIS — Z882 Allergy status to sulfonamides status: Secondary | ICD-10-CM | POA: Insufficient documentation

## 2016-03-04 DIAGNOSIS — I509 Heart failure, unspecified: Secondary | ICD-10-CM | POA: Insufficient documentation

## 2016-03-04 DIAGNOSIS — Z8042 Family history of malignant neoplasm of prostate: Secondary | ICD-10-CM | POA: Diagnosis not present

## 2016-03-04 DIAGNOSIS — N189 Chronic kidney disease, unspecified: Secondary | ICD-10-CM | POA: Insufficient documentation

## 2016-03-04 DIAGNOSIS — Z803 Family history of malignant neoplasm of breast: Secondary | ICD-10-CM | POA: Insufficient documentation

## 2016-03-04 DIAGNOSIS — R160 Hepatomegaly, not elsewhere classified: Secondary | ICD-10-CM | POA: Diagnosis not present

## 2016-03-04 DIAGNOSIS — K760 Fatty (change of) liver, not elsewhere classified: Secondary | ICD-10-CM | POA: Diagnosis not present

## 2016-03-04 DIAGNOSIS — Z7982 Long term (current) use of aspirin: Secondary | ICD-10-CM | POA: Diagnosis not present

## 2016-03-04 DIAGNOSIS — Z960 Presence of urogenital implants: Secondary | ICD-10-CM | POA: Insufficient documentation

## 2016-03-04 DIAGNOSIS — K7689 Other specified diseases of liver: Secondary | ICD-10-CM | POA: Insufficient documentation

## 2016-03-04 DIAGNOSIS — K838 Other specified diseases of biliary tract: Secondary | ICD-10-CM | POA: Diagnosis not present

## 2016-03-04 DIAGNOSIS — K769 Liver disease, unspecified: Secondary | ICD-10-CM

## 2016-03-04 LAB — CBC
HEMATOCRIT: 35 % (ref 35.0–47.0)
Hemoglobin: 11.8 g/dL — ABNORMAL LOW (ref 12.0–16.0)
MCH: 28.4 pg (ref 26.0–34.0)
MCHC: 33.8 g/dL (ref 32.0–36.0)
MCV: 84.1 fL (ref 80.0–100.0)
Platelets: 198 10*3/uL (ref 150–440)
RBC: 4.16 MIL/uL (ref 3.80–5.20)
RDW: 15.8 % — ABNORMAL HIGH (ref 11.5–14.5)
WBC: 6 10*3/uL (ref 3.6–11.0)

## 2016-03-04 LAB — APTT: aPTT: 26 seconds (ref 24–36)

## 2016-03-04 LAB — PROTIME-INR
INR: 0.99
Prothrombin Time: 13.1 seconds (ref 11.4–15.2)

## 2016-03-04 MED ORDER — FENTANYL CITRATE (PF) 100 MCG/2ML IJ SOLN
INTRAMUSCULAR | Status: AC | PRN
  Administered 2016-03-04 (×2): 50 ug via INTRAVENOUS

## 2016-03-04 MED ORDER — MIDAZOLAM HCL 2 MG/2ML IJ SOLN
INTRAMUSCULAR | Status: AC | PRN
  Administered 2016-03-04 (×2): 1 mg via INTRAVENOUS

## 2016-03-04 MED ORDER — HYDROCODONE-ACETAMINOPHEN 5-325 MG PO TABS
1.0000 | ORAL_TABLET | ORAL | Status: DC | PRN
  Filled 2016-03-04: qty 2

## 2016-03-04 MED ORDER — SODIUM CHLORIDE 0.9 % IV SOLN: INTRAVENOUS | Status: DC

## 2016-03-04 NOTE — Discharge Instructions (Signed)
Liver Biopsy, Care After °Introduction °Refer to this sheet in the next few weeks. These instructions provide you with information on caring for yourself after your procedure. Your health care provider may also give you more specific instructions. Your treatment has been planned according to current medical practices, but problems sometimes occur. Call your health care provider if you have any problems or questions after your procedure. °What can I expect after the procedure? °After your procedure, it is typical to have the following: °· A small amount of discomfort in the area where the biopsy was done and in the right shoulder or shoulder blade. °· A small amount of bruising around the area where the biopsy was done and on the skin over the liver. °· Sleepiness and fatigue for the rest of the day. °Follow these instructions at home: °· Rest at home for 1-2 days or as directed by your health care provider. °· Have a friend or family member stay with you for at least 24 hours. °· Because of the medicines used during the procedure, you should not do the following things in the first 24 hours: °¨ Drive. °¨ Use machinery. °¨ Be responsible for the care of other people. °¨ Sign legal documents. °¨ Take a bath or shower. °· There are many different ways to close and cover an incision, including stitches, skin glue, and adhesive strips. Follow your health care provider's instructions on: °¨ Incision care. °¨ Bandage (dressing) changes and removal. °¨ Incision closure removal. °· Do not drink alcohol in the first week. °· Do not lift more than 5 pounds or play contact sports for 2 weeks after this test. °· Take medicines only as directed by your health care provider. Do not take medicine containing aspirin or non-steroidal anti-inflammatory medicines such as ibuprofen for 1 week after this test. °· It is your responsibility to get your test results. °Contact a health care provider if: °· You have increased bleeding from an  incision that results in more than a small spot of blood. °· You have redness, swelling, or increasing pain in any incisions. °· You notice a discharge or a bad smell coming from any of your incisions. °· You have a fever or chills. °Get help right away if: °· You develop swelling, bloating, or pain in your abdomen. °· You become dizzy or faint. °· You develop a rash. °· You are nauseous or vomit. °· You have difficulty breathing, feel short of breath, or feel faint. °· You develop chest pain. °· You have problems with your speech or vision. °· You have trouble balancing or moving your arms or legs. °This information is not intended to replace advice given to you by your health care provider. Make sure you discuss any questions you have with your health care provider. °Document Released: 08/02/2004 Document Revised: 06/21/2015 Document Reviewed: 03/11/2013 °© 2017 Elsevier ° °

## 2016-03-04 NOTE — Procedures (Signed)
US guided core biopsy of liver lesion. Subtle hyperechoic lesion between the left and right hepatic lobes.  3 cores obtained.  Minimal blood loss and no immediate complication.  Plan for 3 hours of bedrest.  See full report in Imaging.

## 2016-03-04 NOTE — Progress Notes (Signed)
Pt resting in bed post liver biopsy with family member present, dressing dry/intact, dozing at intervals, sb per monitor, vss at this time.

## 2016-03-04 NOTE — Consult Note (Signed)
Chief Complaint: Patient was seen in consultation today for image guided liver biopsy at the request of Brahmanday,Govinda R  Referring Physician(s): Brahmanday,Govinda R  Patient Status: [facility] - Out-pt   History of Present Illness: Emma [name] is a 76 y.o. 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 Patient had a known left hepatic lesion but recent imaging demonstrates a new hepatic lesion. Request for image guided biopsy of this new lesion. Patient is currently asymptomatic. Patient says that she had a previous liver biopsy years ago at outside hospital.  Past Medical History:  Diagnosis Date  . Aromatase inhibitor use   . Breast cancer, left breast ([place]) 06/19/2014  . CHF (congestive heart failure) (HCC)    recent sepsis  . Chronic kidney disease   . Dizziness   . Dyspnea    recently while admitted  . GERD (gastroesophageal reflux disease)   . History of hiatal hernia   . History of kidney stones   . HOH (hard of hearing)    severe  . Hypertension   . Hypothyroidism   . Leg DVT (deep venous thromboembolism), acute ([place]) 07/2015   left leg after fracture  . 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 Pain    chest pain while admitted  . Thyroid disease     Past Surgical History:  Procedure Laterality Date  . ABDOMINAL HYSTERECTOMY     partial  . BREAST EXCISIONAL BIOPSY Left 12/2012   +  . BREAST LUMPECTOMY WITH SENTINEL LYMPH NODE BIOPSY Left 2014  . CHOLECYSTECTOMY    . CYSTOSCOPY W/ URETERAL STENT PLACEMENT Right 01/07/2016   Procedure: CYSTOSCOPY WITH STENT REPLACEMENT;  Surgeon: Hollice Espy, MD;  Location: [facility] ORS;  Service: Urology;  Laterality: Right;  . CYSTOSCOPY WITH RETROGRADE PYELOGRAM, URETEROSCOPY AND STENT PLACEMENT Right 12/19/2015   Procedure: CYSTOSCOPY WITH RETROGRADE PYELOGRAM, URETEROSCOPY AND STENT PLACEMENT;  Surgeon: Ardis Hughs, MD;  Location: [facility] ORS;  Service: Urology;   Laterality: Right;  . URETEROSCOPY WITH HOLMIUM LASER LITHOTRIPSY Right 01/07/2016   Procedure: URETEROSCOPY WITH HOLMIUM LASER LITHOTRIPSY;  Surgeon: Hollice Espy, MD;  Location: [facility] ORS;  Service: Urology;  Laterality: Right;    Allergies: Sulfa antibiotics  Medications: Prior to Admission medications   Medication Sig Start Date End Date Taking? Authorizing Provider  acetaminophen (TYLENOL) 500 MG tablet Take 500 mg by mouth every 6 (six) hours as needed (for pain.).   Yes Historical Provider, MD  anastrozole (ARIMIDEX) 1 MG tablet Take 1 tablet (1 mg total) by mouth daily. 11/06/15  Yes [name], MD  aspirin EC 81 MG EC tablet Take 1 tablet (81 mg total) by mouth daily. 12/27/15  Yes Demetrios Loll, MD  Camphor-Menthol-Capsicum (TIGER BALM PAIN RELIEVING) 80-24-16 MG PTCH Place 1 patch onto the skin daily as needed (for knee pain.).   Yes Historical Provider, MD  labetalol (NORMODYNE) 300 MG tablet TAKE 1 TABLET (300 MG TOTAL) BY MOUTH 2 (TWO) TIMES DAILY. 08/09/14  Yes Historical Provider, MD  levothyroxine (SYNTHROID) 88 MCG tablet Take 88 mcg by mouth daily before breakfast.  06/20/14  Yes Historical Provider, MD  losartan-hydrochlorothiazide (HYZAAR) 100-25 MG tablet Take 1 tablet by mouth daily.   Yes Historical Provider, MD  octreotide (SANDOSTATIN LAR) 30 MG injection Inject 30 mg into the muscle every 28 (twenty-eight) days.    Yes Historical Provider, MD  potassium chloride SA (K-DUR,KLOR-CON) 20 MEQ tablet Once a day. 01/11/16  Yes [name], MD  ergocalciferol (VITAMIN D2) 50000 units capsule Take 1 capsule (50,000 Units total) by mouth once a week. Patient taking differently: Take 50,000 Units by mouth every Monday.  11/06/15   [name], MD  furosemide (LASIX) 40 MG tablet Take 40 mg by mouth daily.    Historical Provider, MD  phenazopyridine (PYRIDIUM) 100 MG tablet Take 1 tablet (100 mg total) by mouth 3 (three) times daily with meals. 01/29/16    Dustin Flock, MD     Family History  Problem Relation Age of Onset  . Breast cancer Mother 34  . Breast cancer Maternal Aunt 68  . Prostate cancer Father     Social History   Social History  . Marital status: Widowed    Spouse name: N/A  . Number of children: N/A  . Years of education: N/A   Social History Main Topics  . Smoking status: Never Smoker  . Smokeless tobacco: Never Used  . Alcohol use No  . Drug use: No  . Sexual activity: Not Asked   Other Topics Concern  . None   Social History Narrative  . None      Review of Systems: A 12 point ROS discussed and pertinent positives are indicated in the HPI above.  All other systems are negative.  Review of Systems  Constitutional: Negative for activity change, appetite change, chills and diaphoresis.  Respiratory: Negative.   Cardiovascular: Negative.   Gastrointestinal: Negative.   Genitourinary: Negative.     Vital Signs: BP 124/61   Pulse (!) 55   Temp 97.5 F (36.4 C) (Oral)   Resp 12   Ht 5\' 5"  (1.651 m)   Wt 190 lb (86.2 kg)   SpO2 99%   BMI 31.62 kg/m   Physical Exam  Constitutional: She appears well-developed and well-nourished.  Cardiovascular: Normal rate, regular rhythm and normal heart sounds.   Pulmonary/Chest: Effort normal and breath sounds normal.  Abdominal: Soft. Bowel sounds are normal. She exhibits no distension. There is no tenderness.    Mallampati Score:  MD Evaluation Airway: WNL Heart: WNL Abdomen: WNL Chest/ Lungs: WNL ASA  Classification: 2 Mallampati/Airway Score: One  Imaging: Ct Abdomen Pelvis W Contrast  Result Date: 02/08/2016 CLINICAL DATA:  Acute onset of generalized abdominal pain and 5 pounds of weight gain. Initial encounter. EXAM: CT ABDOMEN AND PELVIS WITH CONTRAST TECHNIQUE: Multidetector CT imaging of the abdomen and pelvis was performed using the standard protocol following bolus administration of intravenous contrast. CONTRAST:  165mL ISOVUE-300  IOPAMIDOL (ISOVUE-300) INJECTION 61% COMPARISON:  CT the abdomen and pelvis performed 12/19/2015 FINDINGS: Lower chest: The visualized lung bases are grossly clear. The visualized portions of the mediastinum are unremarkable. Hepatobiliary: There is a vague heterogeneous 5.0 x 3.3 cm mass within the right hepatic lobe adjacent to the hepatic hilum, with new associated intrahepatic biliary ductal dilatation. A smaller 1.7 cm hypodense mass is seen at the left hepatic lobe. This is concerning for malignancy. Hepatocellular carcinoma or cholangiocarcinoma could have such an appearance. The patient is status post cholecystectomy, with clips noted at the gallbladder fossa. The common bile duct remains normal in caliber. Pancreas: The pancreas is within normal limits. Spleen: A peripherally calcified 3.4 cm splenic cyst is again noted, relatively stable from 2017. Adrenals/Urinary Tract: Minimal right-sided hydronephrosis is again noted, with stranding about the right ureter and right renal pelvis, concerning for mild ureteritis. Right-sided perinephric stranding is improved from the prior study. A tiny nonobstructing 3 mm stone is noted posteriorly at the  right kidney. The left kidney is unremarkable in appearance. The adrenal glands are grossly unremarkable in appearance. Stomach/Bowel: A spiculated retractile mass is again noted at the central small bowel mesentery, with focal calcifications, measuring approximately 7.1 cm in maximal dimension. This is relatively stable from 2017. As before, this remains concerning for a large carcinoid tumor. Visualized small bowel loops are otherwise unremarkable. The appendix is normal in caliber, without evidence of appendicitis. The colon is grossly unremarkable in appearance. The stomach is partially filled with contrast and is grossly unremarkable in appearance. Vascular/Lymphatic: Scattered calcification is seen along the abdominal aorta and its branches. The abdominal aorta is  otherwise grossly unremarkable. The inferior vena cava is grossly unremarkable. No retroperitoneal lymphadenopathy is seen. No pelvic sidewall lymphadenopathy is identified. Reproductive: A likely uterine fibroid is noted, with minimal calcification. A stable 5.4 cm right adnexal cystic focus again may reflect a pedunculated fibroid or ovarian fibrothecoma. No suspicious adnexal masses are seen. Other: No additional soft tissue abnormalities are seen. Musculoskeletal: No acute osseous abnormalities are identified. Vacuum phenomenon is noted along the upper and lower lumbar spine. Facet disease is noted at the lower lumbar spine. The visualized musculature is unremarkable in appearance. IMPRESSION: 1. Vague heterogeneous 5.0 x 3.3 cm mass within the right hepatic lobe, adjacent to the hepatic hilum, with new intrahepatic biliary ductal dilatation. Smaller 1.7 cm hypodense mass at the left hepatic lobe. Findings are concerning for malignancy, with local metastasis. Hepatocellular carcinoma or cholangiocarcinoma could have such an appearance. Would correlate with lab findings. Dynamic liver protocol MRI or CT is recommended for further evaluation. 2. Minimal right-sided hydronephrosis again noted. Stranding about the right ureter and right renal pelvis is concerning for mild right-sided ureteritis. Perinephric stranding is improved from the prior study. 3. Spiculated 7.1 cm retractile mass again noted at the central small bowel mesentery, relatively stable from 2017 and again concerning for a large carcinoid tumor. 4. Stable 5.4 cm right adnexal cystic focus may reflect a pedunculated fibroid or ovarian fibrothecoma. Uterine fibroid again seen. 5. Peripherally calcified 3.4 cm splenic cyst again noted. 6. Scattered aortic atherosclerosis. Electronically Signed   By: Garald Balding M.D.   On: 02/08/2016 02:26   US Renal  Result Date: 03/03/2016 CLINICAL DATA:  Follow-up EXAM: RENAL / URINARY TRACT ULTRASOUND COMPLETE  COMPARISON:  02/08/2016 08/08/2013 FINDINGS: Right Kidney: Length: 10.1 cm. 2.2 cm cystic lesion is noted similar to that seen on recent CT examination as well as multiple previous exams. No obstructive changes are noted. Left Kidney: Length: 10.5 cm. Echogenicity within normal limits. No mass or hydronephrosis visualized. Bladder: Poorly visualized IMPRESSION: Cystic lesion in the right kidney stable from prior CT examinations. No other focal abnormality is seen. Electronically Signed   By: Inez Catalina M.D.   On: 03/03/2016 15:32    Labs:  CBC:  Recent Labs  01/25/16 1401 01/29/16 0524 02/07/16 2237 02/29/16 1052  WBC 6.9 6.8 10.4 6.9  HGB 12.2 11.3* 12.3 12.1  HCT 36.4 33.0* 36.0 36.2  PLT 217 193 236 214    COAGS:  Recent Labs  02/29/16 1052  INR 1.06  APTT 28    BMP:  Recent Labs  01/11/16 0803 01/25/16 1401 01/27/16 1834 01/29/16 0524 02/07/16 2237 02/29/16 1052  NA 136 138  --   --  138 138  K 3.2* 3.3*  --   --  3.5 3.3*  CL 97* 102  --   --  99* 101  CO2 29 28  --   --  28 29  GLUCOSE 136* 106*  --   --  117* 132*  BUN 17 17  --   --  15 20  CALCIUM 9.4 9.3  --   --  9.4 9.4  CREATININE 0.85 0.79 0.81 0.79 0.85 1.02*  GFRNONAA >60 >60 >60 >60 >60 52*  GFRAA >60 >60 >60 >60 >60 >60    LIVER FUNCTION TESTS:  Recent Labs  12/24/15 1754 01/11/16 0803 02/07/16 2237 02/29/16 1052  BILITOT 0.2* 0.9 0.7 0.8  AST 24 32 30 27  ALT 15 15 16  13*  ALKPHOS 89 84 103 92  PROT 6.3* 7.1 7.4 7.2  ALBUMIN 3.0* 3.8 3.8 3.9    TUMOR MARKERS:  Recent Labs  04/02/15 1431 12/06/15 0935  CHROMGRNA 4 2    Assessment and Plan: 76 year old with history of breast cancer and 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 Patient has a new liver lesion. Request for image guided biopsy of this new lesion. I reviewed the patient's imaging and I feel that ultrasound-guided biopsy of this lesion would be feasible with moderate sedation. The procedure was explained to the  patient and the risks including bleeding and infection were discussed. Informed consent was obtained for the patient. Plan for ultrasound-guided core biopsy of the new liver lesion.   Thank you for this interesting consult.  I greatly enjoyed meeting ERCEL NORMOYLE and look forward to participating in their care.  A copy of this report was sent to the requesting provider on this date.  Electronically Signed: Carylon Perches 03/04/2016, 10:42 AM   I spent a total of  15 Minutes   in face to face in clinical consultation, greater than 50% of which was counseling/coordinating care for image guided liver lesion biopsy.

## 2016-03-05 LAB — SURGICAL PATHOLOGY

## 2016-03-06 ENCOUNTER — Ambulatory Visit (INDEPENDENT_AMBULATORY_CARE_PROVIDER_SITE_OTHER): Payer: Medicare Other | Admitting: Urology

## 2016-03-06 ENCOUNTER — Encounter: Payer: Self-pay | Admitting: Urology

## 2016-03-06 VITALS — BP 134/67 | HR 63 | Ht 64.0 in | Wt 197.3 lb

## 2016-03-06 DIAGNOSIS — N201 Calculus of ureter: Secondary | ICD-10-CM | POA: Diagnosis not present

## 2016-03-06 NOTE — Progress Notes (Addendum)
03/06/2016 12:22 PM   Emma [name] Jun 07, 1940 [id number]  Referring provider: [name], MD [address] [facility] [place], [place] [address]  Chief Complaint  Patient presents with  . Results    HPI: Patient is a 76 year old Caucasian female who presents today to review her RUS results after undergoing ureteroscopic stone extraction with her grandson, Rolla Plate.   Patient was admitted to [facility] for leucocytosis and fever in the setting of an obstructing right distal right ureteral stone.  She underwent emergent ureteral stent placement.  After discharge from the hospital, she underwent right URS/LL/ureteral stent exchange on 01/07/2016 with Dr. Erlene Quan.  She returned on 02/07/2016 for stent removal.     RUS completed on 03/03/2016 demonstrated a cystic lesion in the right kidney stable from prior CT examinations.  No other focal abnormality is seen.    She has been fine since her procedures.  She has a history of stones.  Her stone composition is unknown at this time.    PMH: Past Medical History:  Diagnosis Date  . Aromatase inhibitor use   . Breast cancer, left breast ([place]) 06/19/2014  . CHF (congestive heart failure) (HCC)    recent sepsis  . Chronic kidney disease   . Dizziness   . Dyspnea    recently while admitted  . GERD (gastroesophageal reflux disease)   . History of hiatal hernia   . History of kidney stones   . HOH (hard of hearing)    severe  . Hypertension   . Hypothyroidism   . Leg DVT (deep venous thromboembolism), acute ([place]) 07/2015   left leg after fracture  . 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 Pain    chest pain while admitted  . Thyroid disease     Surgical History: Past Surgical History:  Procedure Laterality Date  . ABDOMINAL HYSTERECTOMY     partial  . BREAST EXCISIONAL BIOPSY Left 12/2012   +  . BREAST LUMPECTOMY WITH SENTINEL LYMPH NODE BIOPSY Left 2014  . CHOLECYSTECTOMY    . CYSTOSCOPY W/ URETERAL STENT PLACEMENT Right  01/07/2016   Procedure: CYSTOSCOPY WITH STENT REPLACEMENT;  Surgeon: Hollice Espy, MD;  Location: [facility] ORS;  Service: Urology;  Laterality: Right;  . CYSTOSCOPY WITH RETROGRADE PYELOGRAM, URETEROSCOPY AND STENT PLACEMENT Right 12/19/2015   Procedure: CYSTOSCOPY WITH RETROGRADE PYELOGRAM, URETEROSCOPY AND STENT PLACEMENT;  Surgeon: Ardis Hughs, MD;  Location: [facility] ORS;  Service: Urology;  Laterality: Right;  . URETEROSCOPY WITH HOLMIUM LASER LITHOTRIPSY Right 01/07/2016   Procedure: URETEROSCOPY WITH HOLMIUM LASER LITHOTRIPSY;  Surgeon: Hollice Espy, MD;  Location: [facility] ORS;  Service: Urology;  Laterality: Right;    Home Medications:  Allergies as of 03/06/2016      Reactions   Sulfa Antibiotics    Other reaction(s): Kidney Disorder Other reaction(s): Kidney Disorder Other reaction(s): Kidney Disorder      Medication List       Accurate as of 03/06/16 12:22 PM. Always use your most recent med list.          acetaminophen 500 MG tablet Commonly known as:  TYLENOL Take 500 mg by mouth every 6 (six) hours as needed (for pain.).   anastrozole 1 MG tablet Commonly known as:  ARIMIDEX Take 1 tablet (1 mg total) by mouth daily.   aspirin 81 MG EC tablet Take 1 tablet (81 mg total) by mouth daily.   ergocalciferol 50000 units capsule Commonly known as:  VITAMIN D2 Take 1 capsule (50,000 Units  total) by mouth once a week.   furosemide 40 MG tablet Commonly known as:  LASIX Take 40 mg by mouth daily.   labetalol 300 MG tablet Commonly known as:  NORMODYNE TAKE 1 TABLET (300 MG TOTAL) BY MOUTH 2 (TWO) TIMES DAILY.   losartan-hydrochlorothiazide 100-25 MG tablet Commonly known as:  HYZAAR Take 1 tablet by mouth daily.   octreotide 30 MG injection Commonly known as:  SANDOSTATIN LAR Inject 30 mg into the muscle every 28 (twenty-eight) days.   phenazopyridine 100 MG tablet Commonly known as:  PYRIDIUM Take 1 tablet (100 mg total) by mouth 3 (three) times daily with  meals.   potassium chloride SA 20 MEQ tablet Commonly known as:  K-DUR,KLOR-CON Once a day.   SYNTHROID 88 MCG tablet Generic drug:  levothyroxine Take 88 mcg by mouth daily before breakfast.   TIGER BALM PAIN RELIEVING 80-24-16 MG Ptch Generic drug:  Camphor-Menthol-Capsicum Place 1 patch onto the skin daily as needed (for knee pain.).       Allergies:  Allergies  Allergen Reactions  . Sulfa Antibiotics     Other reaction(s): Kidney Disorder Other reaction(s): Kidney Disorder Other reaction(s): Kidney Disorder    Family History: Family History  Problem Relation Age of Onset  . Breast cancer Mother 10  . Breast cancer Maternal Aunt 68  . Prostate cancer Father     Social History:  reports that she has never smoked. She has never used smokeless tobacco. She reports that she does not drink alcohol or use drugs.  ROS: UROLOGY Frequent Urination?: No Hard to postpone urination?: No Burning/pain with urination?: No Get up at night to urinate?: No Leakage of urine?: No Urine stream starts and stops?: No Trouble starting stream?: No Do you have to strain to urinate?: No Blood in urine?: No Urinary tract infection?: No Sexually transmitted disease?: No Injury to kidneys or bladder?: No Painful intercourse?: No Weak stream?: No Currently pregnant?: No Vaginal bleeding?: No Last menstrual period?: n  Gastrointestinal Nausea?: No Vomiting?: No Indigestion/heartburn?: No Diarrhea?: No Constipation?: No  Constitutional Fever: No Night sweats?: No Weight loss?: No Fatigue?: No  Skin Skin rash/lesions?: No Itching?: No  Eyes Blurred vision?: No Double vision?: No  Ears/Nose/Throat Sore throat?: No Sinus problems?: No  Hematologic/Lymphatic Swollen glands?: No Easy bruising?: No  Cardiovascular Leg swelling?: No Chest pain?: No  Respiratory Cough?: No Shortness of breath?: No  Endocrine Excessive thirst?: No  Musculoskeletal Back pain?:  No Joint pain?: No  Neurological Headaches?: No Dizziness?: No  Psychologic Depression?: No Anxiety?: No  Physical Exam: BP 134/67   Pulse 63   Ht 5\' 4"  (1.626 m)   Wt 197 lb 4.8 oz (89.5 kg)   BMI 33.87 kg/m   Constitutional: Well nourished. Alert and oriented, No acute distress. HEENT: [place] AT, moist mucus membranes. Trachea midline, no masses. Cardiovascular: No clubbing, cyanosis, or edema. Respiratory: Normal respiratory effort, no increased work of breathing. Skin: No rashes, bruises or suspicious lesions. Lymph: No cervical or inguinal adenopathy. Neurologic: Grossly intact, no focal deficits, moving all 4 extremities. Psychiatric: Normal mood and affect.  Laboratory Data: Lab Results  Component Value Date   WBC 6.0 03/04/2016   HGB 11.8 (L) 03/04/2016   HCT 35.0 03/04/2016   MCV 84.1 03/04/2016   PLT 198 03/04/2016    Lab Results  Component Value Date   CREATININE 1.02 (H) 02/29/2016     Lab Results  Component Value Date   HGBA1C 6.1 (H) 12/19/2015  Lab Results  Component Value Date   TSH 1.605 12/24/2015    Lab Results  Component Value Date   AST 27 02/29/2016   Lab Results  Component Value Date   ALT 13 (L) 02/29/2016    Pertinent Imaging: CLINICAL DATA:  Follow-up  EXAM: RENAL / URINARY TRACT ULTRASOUND COMPLETE  COMPARISON:  02/08/2016 08/08/2013  FINDINGS: Right Kidney:  Length: 10.1 cm. 2.2 cm cystic lesion is noted similar to that seen on recent CT examination as well as multiple previous exams. No obstructive changes are noted.  Left Kidney:  Length: 10.5 cm. Echogenicity within normal limits. No mass or hydronephrosis visualized.  Bladder:  Poorly visualized  IMPRESSION: Cystic lesion in the right kidney stable from prior CT examinations.  No other focal abnormality is seen.   Electronically Signed   By: Inez Catalina M.D.   On: 03/03/2016 15:32  I have personally reviewed the films and with  the patient  Assessment & Plan:    1. Right ureteral stone  - s/p right URS/LL/ureteral stent placement and removal  - RUS demonstrates no hydronephrosis  - not interested in pursuing a metabolic work up at this time    Return if symptoms worsen or fail to improve.  These notes generated with voice recognition software. I apologize for typographical errors.  Zara Council, [facility] [address] [place], [place] [address] [phone]

## 2016-03-10 ENCOUNTER — Telehealth: Payer: Self-pay | Admitting: Internal Medicine

## 2016-03-10 ENCOUNTER — Other Ambulatory Visit: Payer: Self-pay | Admitting: Internal Medicine

## 2016-03-10 DIAGNOSIS — C7A019 Malignant carcinoid tumor of the small intestine, unspecified portion: Secondary | ICD-10-CM

## 2016-03-10 DIAGNOSIS — R160 Hepatomegaly, not elsewhere classified: Secondary | ICD-10-CM

## 2016-03-10 DIAGNOSIS — K769 Liver disease, unspecified: Secondary | ICD-10-CM

## 2016-03-10 NOTE — Telephone Encounter (Signed)
Spoke to Dr.Henn/IR- liver Bx- negative; ordered Ga- PET- please schedule.pt to keep the appointment with me as scheduled.  Also order CEA; ca- 27-29; ca -15- when pt comes for her appt on 2/15. Thx

## 2016-03-10 NOTE — Addendum Note (Signed)
Addended by: Sabino Gasser on: 03/10/2016 01:21 PM   Modules accepted: Orders

## 2016-03-10 NOTE — Telephone Encounter (Signed)
Labs added per md order 

## 2016-03-11 NOTE — Telephone Encounter (Signed)
Contacted daughter. Made daughter aware of the plan of care. Explained the bx results and the need for the gal-60 pet scan.  Explained that most likely she will need to go to [place] to have this test performed. Daughter gave verbal understanding. Daughter aware to have pt keep her md apt on Thursday as scheduled. md will review the results with patient face to face. Daughter states that pt's son in law will come to pt's apt with pt.

## 2016-03-12 ENCOUNTER — Telehealth: Payer: Self-pay | Admitting: *Deleted

## 2016-03-12 NOTE — Telephone Encounter (Signed)
I spoke with staff from [facility] in [place] - gal-60 oet scan can be scheduled sooner than 3/13. The sandostatin injection will interfere with the results of the pet scan. Spoke with Dr. Rogue Bussing - md agrees to hold off on sandostatin injection until after pet scan. He will not need to see pt tomorrow until after pet scan.  msg sent to cancer ctr sch. Team to r/s pet scan date, hold off on lab/md/sandostatin apt by 1 week.  Sch. Team will coordinate this pet around the sandostatin apt.   I left a vm msg for daughter- explaining why the apts would be changing.

## 2016-03-13 ENCOUNTER — Inpatient Hospital Stay: Payer: Medicare Other

## 2016-03-13 ENCOUNTER — Inpatient Hospital Stay: Payer: Medicare Other | Admitting: Internal Medicine

## 2016-03-14 ENCOUNTER — Other Ambulatory Visit: Payer: Self-pay | Admitting: Urology

## 2016-03-14 ENCOUNTER — Ambulatory Visit: Payer: Medicare Other | Admitting: Family

## 2016-03-17 ENCOUNTER — Inpatient Hospital Stay: Payer: Medicare Other | Admitting: Internal Medicine

## 2016-03-17 ENCOUNTER — Inpatient Hospital Stay: Payer: Medicare Other

## 2016-03-21 ENCOUNTER — Encounter (HOSPITAL_COMMUNITY)
Admission: RE | Admit: 2016-03-21 | Discharge: 2016-03-21 | Disposition: A | Discharge status: Home / Self Care | Payer: Medicare Other | Source: Ambulatory Visit | Attending: Internal Medicine | Admitting: Internal Medicine

## 2016-03-21 DIAGNOSIS — C7A019 Malignant carcinoid tumor of the small intestine, unspecified portion: Secondary | ICD-10-CM | POA: Insufficient documentation

## 2016-03-21 MED ORDER — GALLIUM GA 68 DOTATATE IV KIT
4.7900 | PACK | Freq: Once | INTRAVENOUS | Status: AC
  Administered 2016-03-21: 4.79 via INTRAVENOUS

## 2016-03-22 ENCOUNTER — Other Ambulatory Visit: Payer: Self-pay | Admitting: Urology

## 2016-03-24 ENCOUNTER — Other Ambulatory Visit: Payer: Self-pay | Admitting: *Deleted

## 2016-03-24 DIAGNOSIS — C7A019 Malignant carcinoid tumor of the small intestine, unspecified portion: Secondary | ICD-10-CM

## 2016-03-25 ENCOUNTER — Inpatient Hospital Stay (HOSPITAL_BASED_OUTPATIENT_CLINIC_OR_DEPARTMENT_OTHER): Payer: Medicare Other | Admitting: Internal Medicine

## 2016-03-25 ENCOUNTER — Inpatient Hospital Stay: Payer: Medicare Other

## 2016-03-25 ENCOUNTER — Inpatient Hospital Stay: Payer: Medicare Other | Attending: Internal Medicine

## 2016-03-25 VITALS — BP 112/56 | HR 60 | Temp 97.4°F | Wt 197.6 lb

## 2016-03-25 DIAGNOSIS — E875 Hyperkalemia: Secondary | ICD-10-CM | POA: Diagnosis not present

## 2016-03-25 DIAGNOSIS — Q2730 Arteriovenous malformation, site unspecified: Secondary | ICD-10-CM | POA: Diagnosis not present

## 2016-03-25 DIAGNOSIS — Z79811 Long term (current) use of aromatase inhibitors: Secondary | ICD-10-CM

## 2016-03-25 DIAGNOSIS — C7A019 Malignant carcinoid tumor of the small intestine, unspecified portion: Secondary | ICD-10-CM

## 2016-03-25 DIAGNOSIS — K6389 Other specified diseases of intestine: Secondary | ICD-10-CM

## 2016-03-25 DIAGNOSIS — C787 Secondary malignant neoplasm of liver and intrahepatic bile duct: Secondary | ICD-10-CM

## 2016-03-25 DIAGNOSIS — K769 Liver disease, unspecified: Secondary | ICD-10-CM

## 2016-03-25 DIAGNOSIS — Z79899 Other long term (current) drug therapy: Secondary | ICD-10-CM

## 2016-03-25 DIAGNOSIS — C50912 Malignant neoplasm of unspecified site of left female breast: Secondary | ICD-10-CM

## 2016-03-25 DIAGNOSIS — Z170 Estrogen receptor positive status [ER+]: Secondary | ICD-10-CM

## 2016-03-25 DIAGNOSIS — D509 Iron deficiency anemia, unspecified: Secondary | ICD-10-CM | POA: Diagnosis not present

## 2016-03-25 DIAGNOSIS — R160 Hepatomegaly, not elsewhere classified: Secondary | ICD-10-CM

## 2016-03-25 LAB — COMPREHENSIVE METABOLIC PANEL
ALBUMIN: 3.5 g/dL (ref 3.5–5.0)
ALT: 13 U/L — ABNORMAL LOW (ref 14–54)
AST: 27 U/L (ref 15–41)
Alkaline Phosphatase: 91 U/L (ref 38–126)
Anion gap: 12 (ref 5–15)
BUN: 17 mg/dL (ref 6–20)
CHLORIDE: 96 mmol/L — AB (ref 101–111)
CO2: 30 mmol/L (ref 22–32)
Calcium: 8.9 mg/dL (ref 8.9–10.3)
Creatinine, Ser: 0.97 mg/dL (ref 0.44–1.00)
GFR calc Af Amer: >60 mL/min (ref >60)
GFR calc non Af Amer: 56 mL/min — ABNORMAL LOW (ref >60)
GLUCOSE: 132 mg/dL — AB (ref 65–99)
POTASSIUM: 2.8 mmol/L — AB (ref 3.5–5.1)
SODIUM: 138 mmol/L (ref 135–145)
Total Bilirubin: 0.9 mg/dL (ref 0.3–1.2)
Total Protein: 6.6 g/dL (ref 6.5–8.1)

## 2016-03-25 LAB — CBC WITH DIFFERENTIAL/PLATELET
BASOS ABS: 0 10*3/uL (ref 0–0.1)
BASOS PCT: 1 %
EOS PCT: 5 %
Eosinophils Absolute: 0.3 10*3/uL (ref 0–0.7)
HCT: 33.1 % — ABNORMAL LOW (ref 35.0–47.0)
Hemoglobin: 11.1 g/dL — ABNORMAL LOW (ref 12.0–16.0)
Lymphocytes Relative: 10 %
Lymphs Abs: 0.5 10*3/uL — ABNORMAL LOW (ref 1.0–3.6)
MCH: 28.5 pg (ref 26.0–34.0)
MCHC: 33.4 g/dL (ref 32.0–36.0)
MCV: 85.3 fL (ref 80.0–100.0)
MONO ABS: 0.5 10*3/uL (ref 0.2–0.9)
Monocytes Relative: 11 %
Neutro Abs: 3.7 10*3/uL (ref 1.4–6.5)
Neutrophils Relative %: 73 %
PLATELETS: 196 10*3/uL (ref 150–440)
RBC: 3.88 MIL/uL (ref 3.80–5.20)
RDW: 16.2 % — AB (ref 11.5–14.5)
WBC: 5.1 10*3/uL (ref 3.6–11.0)

## 2016-03-25 MED ORDER — POTASSIUM CHLORIDE CRYS ER 20 MEQ PO TBCR: EXTENDED_RELEASE_TABLET | ORAL | 4 refills | Status: DC

## 2016-03-25 MED ORDER — ERGOCALCIFEROL 1.25 MG (50000 UT) PO CAPS: 50000.0000 [IU] | ORAL_CAPSULE | ORAL | 1 refills | Status: DC

## 2016-03-25 MED ORDER — OCTREOTIDE ACETATE 20 MG IM KIT
20.0000 mg | PACK | Freq: Once | INTRAMUSCULAR | Status: AC
  Administered 2016-03-25: 20 mg via INTRAMUSCULAR

## 2016-03-25 NOTE — Progress Notes (Addendum)
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] s/p Lumpec & RT; ER/PR > 90%; Her2 Neu-NEG; Arimidex; MAY 2016- Mammo-NEG; May 1st HOLD Arimidex sec to myalgias  # IDA s/p IV iron; last March 2014; [place] [Dr.Elliot; Jan 2016] colon angiotele- s/p Argon laser; April 2017- [facility]  # DVT [taken off eliquis DEC 2017]; NOV -DEC 2017 CHF/ UTI with sepsis- stone extracted [Dr.Brandon]  # Thyroid nodule- MNG s/p Bx [NOV 2016]/ right adnexal mass [stable since 2010]; hard of hearing     Carcinoid tumor of small intestine, malignant ([place])   10/08/2015 Initial Diagnosis    Carcinoid tumor of small intestine, malignant ([place])       INTERVAL HISTORY: Patient is a poor historian/hard of hearing  A very pleasant 76 year old female patient with above history of breast cancer and also mesenteric mass/small bowel carcinoid is here for follow-up- given the recent diagnosis of a "new liver mass". She underwent a liver biopsy. She also underwent a gallium PET scan. She is here accompanied by her son-in-law to discuss the next plan of care.  Patient denies any swelling in the legs. Patient's breathing is improved. No diarrhea. No wheezing. No flushing.. Not any worse. Denies any blood in stools or black colored stools. Chronic mild fatigue. Complains of bilateral knee pain interested in having  knee surgeries.  REVIEW OF SYSTEMS:  A complete 10 point review of system is done which is negative except mentioned above/history of present illness.   PAST MEDICAL HISTORY :  Past Medical History:  Diagnosis Date  . Aromatase inhibitor use   . Breast cancer, left breast ([place]) 06/19/2014  . CHF (congestive heart failure) ([facility])    recent sepsis  . Chronic kidney disease   . Dizziness   . Dyspnea    recently while admitted  . GERD (gastroesophageal reflux disease)   . History of hiatal hernia   . History of kidney stones   . HOH (hard of hearing)    severe  . Hypertension   . Hypothyroidism   . Leg DVT (deep venous thromboembolism), acute ([place]) 07/2015   left leg after fracture  . 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 Pain    chest pain while admitted  . Thyroid disease     PAST SURGICAL HISTORY :   Past Surgical History:  Procedure Laterality Date  . ABDOMINAL HYSTERECTOMY     partial  . BREAST EXCISIONAL BIOPSY Left 12/2012   +  . BREAST LUMPECTOMY WITH SENTINEL LYMPH NODE BIOPSY Left 2014  . CHOLECYSTECTOMY    . CYSTOSCOPY W/ URETERAL STENT PLACEMENT Right 01/07/2016   Procedure: CYSTOSCOPY WITH STENT REPLACEMENT;  Surgeon: Hollice Espy, MD;  Location: [facility] OR[facility];  Service: Urology;  Laterality: Right;  . CYSTOSCOPY WITH RETROGRADE PYELOGRAM, URETEROSCOPY AND STENT PLACEMENT Right 12/19/2015   Procedure: CYSTOSCOPY WITH RETROGRADE PYELOGRAM, URETEROSCOPY AND STENT PLACEMENT;  Surgeon: Ardis Hughs, MD;  Location: [facility];  Service: Urology;  Laterality:  Right;  Marland Kitchen URETEROSCOPY WITH HOLMIUM LASER LITHOTRIPSY Right 01/07/2016   Procedure: URETEROSCOPY WITH HOLMIUM LASER LITHOTRIPSY;  Surgeon: Hollice Espy, MD;  Location: [facility];  Service: Urology;  Laterality: Right;    FAMILY HISTORY :   Family History  Problem Relation Age of Onset  . Breast cancer Mother 66  . Breast cancer Maternal Aunt 68  . Prostate cancer Father     SOCIAL HISTORY:   Social History   Substance Use Topics  . Smoking status: Never Smoker  . Smokeless tobacco: Never Used  . Alcohol use No    ALLERGIES:  is allergic to sulfa antibiotics.  MEDICATIONS:  Current Outpatient Prescriptions  Medication Sig Dispense Refill  . acetaminophen (TYLENOL) 500 MG tablet Take 500 mg by mouth every 6 (six) hours as needed (for pain.).    Marland Kitchen anastrozole (ARIMIDEX) 1 MG tablet Take 1 tablet (1 mg total) by mouth daily. 30 tablet 6  . aspirin EC 81 MG EC tablet Take 1 tablet (81 mg total) by mouth daily. 30 tablet 2  . Camphor-Menthol-Capsicum (TIGER BALM PAIN RELIEVING) 80-24-16 MG PTCH Place 1 patch onto the skin daily as needed (for knee pain.).    Marland Kitchen ergocalciferol (VITAMIN D2) 50000 units capsule Take 1 capsule (50,000 Units total) by mouth once a week. 24 capsule 1  . furosemide (LASIX) 40 MG tablet Take 40 mg by mouth daily.    Marland Kitchen labetalol (NORMODYNE) 300 MG tablet TAKE 1 TABLET (300 MG TOTAL) BY MOUTH 2 (TWO) TIMES DAILY.    Marland Kitchen levothyroxine (SYNTHROID) 88 MCG tablet Take 88 mcg by mouth daily before breakfast.     . losartan-hydrochlorothiazide (HYZAAR) 100-25 MG tablet Take 1 tablet by mouth daily.    Marland Kitchen octreotide (SANDOSTATIN LAR) 30 MG injection Inject 30 mg into the muscle every 28 (twenty-eight) days.     . potassium chloride SA (K-DUR,KLOR-CON) 20 MEQ tablet Every 8 hours 90 tablet 4  . phenazopyridine (PYRIDIUM) 100 MG tablet Take 1 tablet (100 mg total) by mouth 3 (three) times daily with meals. (Patient not taking: Reported on 03/06/2016) 10 tablet 0   No current facility-administered medications for this visit.     PHYSICAL EXAMINATION: ECOG PERFORMANCE STATUS: 0 - Asymptomatic  BP (!) 112/56 (BP Location: Right Arm, Patient Position: Sitting)   Pulse 60   Temp 97.4 F (36.3 C) (Tympanic)   Wt 197 lb 10.3 oz (89.7 kg)   BMI 33.93 kg/m   Filed Weights   03/25/16 1039  Weight: 197 lb 10.3 oz (89.7 kg)   GENERAL: Well-nourished well-developed; Alert, no distress  and comfortable.hard of hearing.She is in a wheelchair. Accompanied by her son-in-law. EYES: no pallor or icterus OROPHARYNX: no thrush or ulceration; good dentition  NECK: supple, no masses felt LYMPH: no palpable lymphadenopathy in the cervical, axillary or inguinal regions LUNGS: clear to auscultation and No wheeze or crackles HEART/CVS: regular rate & rhythm and no murmurs; No lower extremity edema; ABDOMEN:abdomen soft, non-tender and normal bowel sounds Musculoskeletal:no cyanosis of digits and no clubbing  PSYCH: alert & oriented x 3 with fluent speech NEURO: no focal motor/sensory deficits SKIN: no rashes or significant lesions  LABORATORY DATA:  I have reviewed the data as listed    Component Value Date/Time   NA 138 03/25/2016 1016   NA 137 07/12/2013 0841   K 2.8 (L) 03/25/2016 1016   K 4.7 07/12/2013 0841   CL 96 (L) 03/25/2016 1016   CL 107 07/12/2013 0841   CO2  30 03/25/2016 1016   CO2 20 (L) 07/12/2013 0841   GLUCOSE 132 (H) 03/25/2016 1016   GLUCOSE 122 (H) 07/12/2013 0841   BUN 17 03/25/2016 1016   BUN 18 07/12/2013 0841   CREATININE 0.97 03/25/2016 1016   CREATININE 0.64 05/22/2014 1111   CALCIUM 8.9 03/25/2016 1016   CALCIUM 9.2 07/12/2013 0841   PROT 6.6 03/25/2016 1016   PROT 7.0 05/22/2014 1111   ALBUMIN 3.5 03/25/2016 1016   ALBUMIN 4.2 05/22/2014 1111   AST 27 03/25/2016 1016   AST 29 05/22/2014 1111   ALT 13 (L) 03/25/2016 1016   ALT 17 05/22/2014 1111   ALKPHOS 91 03/25/2016 1016   ALKPHOS 95 05/22/2014 1111   BILITOT 0.9 03/25/2016 1016   BILITOT 0.8 05/22/2014 1111   GFRNONAA 56 (L) 03/25/2016 1016   GFRNONAA >60 05/22/2014 1111   GFRAA >60 03/25/2016 1016   GFRAA >60 05/22/2014 1111    No results found for: SPEP, UPEP  Lab Results  Component Value Date   WBC 5.1 03/25/2016   NEUTROABS 3.7 03/25/2016   HGB 11.1 (L) 03/25/2016   HCT 33.1 (L) 03/25/2016   MCV 85.3 03/25/2016   PLT 196 03/25/2016      Chemistry       Component Value Date/Time   NA 138 03/25/2016 1016   NA 137 07/12/2013 0841   K 2.8 (L) 03/25/2016 1016   K 4.7 07/12/2013 0841   CL 96 (L) 03/25/2016 1016   CL 107 07/12/2013 0841   CO2 30 03/25/2016 1016   CO2 20 (L) 07/12/2013 0841   BUN 17 03/25/2016 1016   BUN 18 07/12/2013 0841   CREATININE 0.97 03/25/2016 1016   CREATININE 0.64 05/22/2014 1111      Component Value Date/Time   CALCIUM 8.9 03/25/2016 1016   CALCIUM 9.2 07/12/2013 0841   ALKPHOS 91 03/25/2016 1016   ALKPHOS 95 05/22/2014 1111   AST 27 03/25/2016 1016   AST 29 05/22/2014 1111   ALT 13 (L) 03/25/2016 1016   ALT 17 05/22/2014 1111   BILITOT 0.9 03/25/2016 1016   BILITOT 0.8 05/22/2014 1111       IMPRESSION: 1. Vague heterogeneous 5.0 x 3.3 cm mass within the right hepatic lobe, adjacent to the hepatic hilum, with new intrahepatic biliary ductal dilatation. Smaller 1.7 cm hypodense mass at the left hepatic lobe. Findings are concerning for malignancy, with local metastasis. Hepatocellular carcinoma or cholangiocarcinoma could have such an appearance. Would correlate with lab findings. Dynamic liver protocol MRI or CT is recommended for further evaluation. 2. Minimal right-sided hydronephrosis again noted. Stranding about the right ureter and right renal pelvis is concerning for mild right-sided ureteritis. Perinephric stranding is improved from the prior study. 3. Spiculated 7.1 cm retractile mass again noted at the central small bowel mesentery, relatively stable from 2017 and again concerning for a large carcinoid tumor. 4. Stable 5.4 cm right adnexal cystic focus may reflect a pedunculated fibroid or ovarian fibrothecoma. Uterine fibroid again seen. 5. Peripherally calcified 3.4 cm splenic cyst again noted. 6. Scattered aortic atherosclerosis.   Electronically Signed   By: Garald Balding M.D.   On: 02/08/2016 02:26   ASSESSMENT & PLAN:  Carcinoid tumor of small intestine, malignant  ([place]) # 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 Feb 2018- gallium PET- small bowel mesenteric carcinoid with metastases to the liver; however no uptake in the 5 cm right hepatic lobe mass [  see discussion below]  # Continue Octroetide shots monthly. Due for one today.  # Right hepatic lobe mass -"~ 5cm New lesion" on the right hepatic lobe-likely new primary; status post biopsy - negative for malignancy /nondiagnostic. Discussed with interventional radiology. Will review imaging at the tumor conference- regarding the next step.  # Iron deficiency anemia/Hx of AV malformation- Status post Feraheme April 2017- recent hemoglobin is 12.3.    # LEFT BREAST CA STAGE I; ER/PR positive HER-2/neu negative. Currently on Arimidex. Less likely breast cancer metastasizing to the liver;  but again possible.  # Severe hyperkalemia potassium 2.8-likely secondary Lasix 40 mg a day.  Recommend increasing the potassium  To 60 mEq a day. New script given.   # Follow-up with me in approximately 1 month/labs;  Shots.  # I reviewed the blood work- with the patient in detail; also reviewed the imaging independently [as summarized above]; and with the patient in detail.   # The above plan of care was discussed with the patient and her son-in-law in detail. We'll give the daughter call after the tumor conference [id number] plan of care.       [name], MD 03/25/2016 11:18 AM

## 2016-03-25 NOTE — Assessment & Plan Note (Addendum)
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 Feb 2018- gallium PET- small bowel mesenteric carcinoid with metastases to the liver; however no uptake in the 5 cm right hepatic lobe mass [see discussion below]  # Continue Octroetide shots monthly. Due for one today.  # Right hepatic lobe mass -"~ 5cm New lesion" on the right hepatic lobe-likely new primary; status post biopsy - negative for malignancy /nondiagnostic. Discussed with interventional radiology. Will review imaging at the tumor conference- regarding the next step.  # Iron deficiency anemia/Hx of AV malformation- Status post Feraheme April 2017- recent hemoglobin is 12.3.    # LEFT BREAST CA STAGE I; ER/PR positive HER-2/neu negative. Currently on Arimidex. Less likely breast cancer metastasizing to the liver;  but again possible.  # Severe hyperkalemia potassium 2.8-likely secondary Lasix 40 mg a day.  Recommend increasing the potassium  To 60 mEq a day. New script given.   # Follow-up with me in approximately 1 month/labs;  Shots.  # I reviewed the blood work- with the patient in detail; also reviewed the imaging independently [as summarized above]; and with the patient in detail.   # The above plan of care was discussed with the patient and her son-in-law in detail. We'll give the daughter call after the tumor conference [id number] plan of care.   Addendum: Discussed at tumor conference- recommend MRI of liver with contrast. Inform pt/daughter.

## 2016-03-25 NOTE — Progress Notes (Signed)
Patient here today for follow up.  Patient is wanting to have knee surgery, wanting confirmation that would be okay at this time

## 2016-03-26 LAB — CHROMOGRANIN A: CHROMOGRANIN A: 5 nmol/L (ref 0–5)

## 2016-03-26 LAB — CEA: CEA: 1.4 ng/mL (ref 0.0–4.7)

## 2016-03-26 LAB — CANCER ANTIGEN 27.29: CA 27.29: 11.4 U/mL (ref 0.0–38.6)

## 2016-03-26 LAB — CANCER ANTIGEN 15-3: CA 15-3: 9.8 U/mL (ref 0.0–25.0)

## 2016-03-28 ENCOUNTER — Telehealth: Payer: Self-pay | Admitting: Urology

## 2016-03-28 ENCOUNTER — Other Ambulatory Visit: Payer: Self-pay | Admitting: Urology

## 2016-03-28 ENCOUNTER — Telehealth: Payer: Self-pay | Admitting: Internal Medicine

## 2016-03-28 DIAGNOSIS — R160 Hepatomegaly, not elsewhere classified: Secondary | ICD-10-CM | POA: Insufficient documentation

## 2016-03-28 NOTE — Telephone Encounter (Signed)
Would you contact the patient and ask her why she is requesting the refills for oxybutynin?

## 2016-03-28 NOTE — Telephone Encounter (Signed)
Attempted to reach daughter, [name], on mobile phone at 63. No answer. Not able to leave vm. Left msg on home phone for [name] to contact cancer ctr.

## 2016-03-28 NOTE — Telephone Encounter (Signed)
Daughter returned phone call at 959 am - left msg to call her at work at [phone]. Call returned at 1200 noon today. But the phone line stated, you have reached [facility]. If you know your parties extension, but enter this now."

## 2016-03-28 NOTE — Telephone Encounter (Signed)
Daughter returned phone call at [phone] from work #. Information provided regarding the need for the MRI. Agreeable to set up mri dept. msg sent to scheduling to arrange mri

## 2016-03-28 NOTE — Telephone Encounter (Signed)
Discussed at tumor conference- recommend MRI of liver with contrast. Please inform pt's daughter/schedule.

## 2016-03-31 ENCOUNTER — Inpatient Hospital Stay: Payer: Medicare Other | Attending: Internal Medicine

## 2016-03-31 DIAGNOSIS — Z7982 Long term (current) use of aspirin: Secondary | ICD-10-CM | POA: Insufficient documentation

## 2016-03-31 DIAGNOSIS — Z170 Estrogen receptor positive status [ER+]: Secondary | ICD-10-CM | POA: Insufficient documentation

## 2016-03-31 DIAGNOSIS — D509 Iron deficiency anemia, unspecified: Secondary | ICD-10-CM | POA: Diagnosis not present

## 2016-03-31 DIAGNOSIS — C787 Secondary malignant neoplasm of liver and intrahepatic bile duct: Secondary | ICD-10-CM | POA: Diagnosis not present

## 2016-03-31 DIAGNOSIS — C50912 Malignant neoplasm of unspecified site of left female breast: Secondary | ICD-10-CM | POA: Diagnosis not present

## 2016-03-31 DIAGNOSIS — Z79811 Long term (current) use of aromatase inhibitors: Secondary | ICD-10-CM | POA: Diagnosis not present

## 2016-03-31 DIAGNOSIS — K219 Gastro-esophageal reflux disease without esophagitis: Secondary | ICD-10-CM | POA: Diagnosis not present

## 2016-03-31 DIAGNOSIS — N390 Urinary tract infection, site not specified: Secondary | ICD-10-CM | POA: Diagnosis not present

## 2016-03-31 DIAGNOSIS — E039 Hypothyroidism, unspecified: Secondary | ICD-10-CM | POA: Insufficient documentation

## 2016-03-31 DIAGNOSIS — Z87442 Personal history of urinary calculi: Secondary | ICD-10-CM | POA: Diagnosis not present

## 2016-03-31 DIAGNOSIS — Z86718 Personal history of other venous thrombosis and embolism: Secondary | ICD-10-CM | POA: Diagnosis not present

## 2016-03-31 DIAGNOSIS — Q2730 Arteriovenous malformation, site unspecified: Secondary | ICD-10-CM | POA: Insufficient documentation

## 2016-03-31 DIAGNOSIS — Z79899 Other long term (current) drug therapy: Secondary | ICD-10-CM | POA: Diagnosis not present

## 2016-03-31 DIAGNOSIS — I509 Heart failure, unspecified: Secondary | ICD-10-CM | POA: Insufficient documentation

## 2016-03-31 DIAGNOSIS — C7A019 Malignant carcinoid tumor of the small intestine, unspecified portion: Secondary | ICD-10-CM | POA: Insufficient documentation

## 2016-03-31 DIAGNOSIS — N189 Chronic kidney disease, unspecified: Secondary | ICD-10-CM | POA: Diagnosis not present

## 2016-03-31 DIAGNOSIS — I700 Atherosclerosis of aorta: Secondary | ICD-10-CM | POA: Insufficient documentation

## 2016-03-31 DIAGNOSIS — I130 Hypertensive heart and chronic kidney disease with heart failure and stage 1 through stage 4 chronic kidney disease, or unspecified chronic kidney disease: Secondary | ICD-10-CM | POA: Diagnosis not present

## 2016-03-31 DIAGNOSIS — R160 Hepatomegaly, not elsewhere classified: Secondary | ICD-10-CM | POA: Insufficient documentation

## 2016-03-31 DIAGNOSIS — D734 Cyst of spleen: Secondary | ICD-10-CM | POA: Insufficient documentation

## 2016-03-31 DIAGNOSIS — E875 Hyperkalemia: Secondary | ICD-10-CM | POA: Diagnosis not present

## 2016-03-31 DIAGNOSIS — K449 Diaphragmatic hernia without obstruction or gangrene: Secondary | ICD-10-CM | POA: Diagnosis not present

## 2016-03-31 NOTE — Telephone Encounter (Signed)
LMOM

## 2016-03-31 NOTE — Telephone Encounter (Signed)
Spoke with pt daughter in reference to oxybutynin. Daughter stated pt is not requesting medication at this point.

## 2016-04-03 LAB — 5 HIAA, QUANTITATIVE, URINE, 24 HOUR
5 HIAA UR: 6.7 mg/L
5-HIAA,Quant.,24 Hr Urine: 3 mg/24 hr (ref 0.0–14.9)
Total Volume: 450

## 2016-04-04 ENCOUNTER — Ambulatory Visit
Admission: RE | Admit: 2016-04-04 | Discharge: 2016-04-04 | Disposition: A | Discharge status: Home / Self Care | Payer: Medicare Other | Source: Ambulatory Visit | Attending: Internal Medicine | Admitting: Internal Medicine

## 2016-04-04 DIAGNOSIS — C787 Secondary malignant neoplasm of liver and intrahepatic bile duct: Secondary | ICD-10-CM | POA: Diagnosis not present

## 2016-04-04 DIAGNOSIS — K760 Fatty (change of) liver, not elsewhere classified: Secondary | ICD-10-CM | POA: Insufficient documentation

## 2016-04-04 DIAGNOSIS — R160 Hepatomegaly, not elsewhere classified: Secondary | ICD-10-CM | POA: Insufficient documentation

## 2016-04-04 MED ORDER — GADOBENATE DIMEGLUMINE 529 MG/ML IV SOLN
20.0000 mL | Freq: Once | INTRAVENOUS | Status: AC | PRN
  Administered 2016-04-04: 18 mL via INTRAVENOUS

## 2016-04-07 NOTE — Addendum Note (Signed)
Encounter addended by: Vernie Murders, RT on: 04/07/2016  9:40 AM<BR>    Actions taken: Imaging Exam ended, Charge Capture section accepted

## 2016-04-08 ENCOUNTER — Other Ambulatory Visit (HOSPITAL_COMMUNITY): Payer: Medicare Other

## 2016-04-22 ENCOUNTER — Inpatient Hospital Stay (HOSPITAL_BASED_OUTPATIENT_CLINIC_OR_DEPARTMENT_OTHER): Payer: Medicare Other | Admitting: Internal Medicine

## 2016-04-22 ENCOUNTER — Inpatient Hospital Stay: Payer: Medicare Other

## 2016-04-22 VITALS — BP 136/63 | HR 62 | Temp 96.1°F | Resp 18 | Wt 200.3 lb

## 2016-04-22 DIAGNOSIS — K6389 Other specified diseases of intestine: Secondary | ICD-10-CM

## 2016-04-22 DIAGNOSIS — D509 Iron deficiency anemia, unspecified: Secondary | ICD-10-CM

## 2016-04-22 DIAGNOSIS — E876 Hypokalemia: Secondary | ICD-10-CM | POA: Diagnosis not present

## 2016-04-22 DIAGNOSIS — C787 Secondary malignant neoplasm of liver and intrahepatic bile duct: Secondary | ICD-10-CM

## 2016-04-22 DIAGNOSIS — C50912 Malignant neoplasm of unspecified site of left female breast: Secondary | ICD-10-CM | POA: Diagnosis not present

## 2016-04-22 DIAGNOSIS — C7A019 Malignant carcinoid tumor of the small intestine, unspecified portion: Secondary | ICD-10-CM

## 2016-04-22 DIAGNOSIS — Z79811 Long term (current) use of aromatase inhibitors: Secondary | ICD-10-CM | POA: Diagnosis not present

## 2016-04-22 DIAGNOSIS — R160 Hepatomegaly, not elsewhere classified: Secondary | ICD-10-CM

## 2016-04-22 DIAGNOSIS — Z79899 Other long term (current) drug therapy: Secondary | ICD-10-CM | POA: Diagnosis not present

## 2016-04-22 DIAGNOSIS — Z170 Estrogen receptor positive status [ER+]: Secondary | ICD-10-CM

## 2016-04-22 DIAGNOSIS — Q2730 Arteriovenous malformation, site unspecified: Secondary | ICD-10-CM | POA: Diagnosis not present

## 2016-04-22 LAB — CBC WITH DIFFERENTIAL/PLATELET
Basophils Absolute: 0.1 10*3/uL (ref 0–0.1)
Basophils Relative: 1 %
EOS ABS: 0.3 10*3/uL (ref 0–0.7)
Eosinophils Relative: 5 %
HEMATOCRIT: 34.3 % — AB (ref 35.0–47.0)
HEMOGLOBIN: 11.3 g/dL — AB (ref 12.0–16.0)
LYMPHS ABS: 0.6 10*3/uL — AB (ref 1.0–3.6)
Lymphocytes Relative: 10 %
MCH: 28.6 pg (ref 26.0–34.0)
MCHC: 33 g/dL (ref 32.0–36.0)
MCV: 86.7 fL (ref 80.0–100.0)
MONO ABS: 0.7 10*3/uL (ref 0.2–0.9)
MONOS PCT: 14 %
NEUTROS PCT: 70 %
Neutro Abs: 3.7 10*3/uL (ref 1.4–6.5)
Platelets: 187 10*3/uL (ref 150–440)
RBC: 3.95 MIL/uL (ref 3.80–5.20)
RDW: 14.4 % (ref 11.5–14.5)
WBC: 5.3 10*3/uL (ref 3.6–11.0)

## 2016-04-22 LAB — BASIC METABOLIC PANEL
Anion gap: 7 (ref 5–15)
BUN: 24 mg/dL — AB (ref 6–20)
CO2: 29 mmol/L (ref 22–32)
CREATININE: 0.89 mg/dL (ref 0.44–1.00)
Calcium: 9.2 mg/dL (ref 8.9–10.3)
Chloride: 102 mmol/L (ref 101–111)
GFR calc Af Amer: >60 mL/min (ref >60)
GFR calc non Af Amer: >60 mL/min (ref >60)
Glucose, Bld: 120 mg/dL — ABNORMAL HIGH (ref 65–99)
Potassium: 3.4 mmol/L — ABNORMAL LOW (ref 3.5–5.1)
SODIUM: 138 mmol/L (ref 135–145)

## 2016-04-22 MED ORDER — OCTREOTIDE ACETATE 20 MG IM KIT
20.0000 mg | PACK | Freq: Once | INTRAMUSCULAR | Status: AC
  Administered 2016-04-22: 20 mg via INTRAMUSCULAR

## 2016-04-22 NOTE — Progress Notes (Signed)
Patient's son in law transported patient today to the apt. Declined to join pt in exam room for md discussion.

## 2016-04-22 NOTE — Progress Notes (Signed)
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 No family.   A very pleasant 76 year old female patient with above history of breast cancer and also mesenteric mass/small bowel carcinoid is here for follow-up; Also to review the results of the MRI that was ordered for her  "new liver mass".   Patient continues to have mild chronic diarrhea one loose stool every few days. Patient has chronic mild swelling in the legs. Patient's breathing is improved. No diarrhea. No wheezing. No flushing.Denies any blood in stools or black colored stools. Chronic mild fatigue. Compressive chronic arthritic pain in the  knees. Not any worse.   REVIEW OF SYSTEMS:  A complete 10 point review of system is done which is negative except mentioned above/history of present illness.   PAST MEDICAL HISTORY :  Past Medical History:  Diagnosis Date  . Aromatase inhibitor use   . Breast cancer, left breast ([facility]) 06/19/2014  . CHF (congestive heart failure) (HCC)    recent sepsis  . Chronic kidney disease   . Dizziness   . Dyspnea    recently while admitted  . GERD (gastroesophageal reflux disease)   . History of hiatal hernia   . History of kidney stones   . HOH (hard of hearing)    severe  . Hypertension   . Hypothyroidism   . Leg DVT (deep venous thromboembolism), acute ([place]) 07/2015   left leg after fracture  . 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 Pain    chest pain while admitted  . Thyroid disease     PAST SURGICAL HISTORY :   Past Surgical History:  Procedure Laterality Date  . ABDOMINAL HYSTERECTOMY     partial  . BREAST EXCISIONAL BIOPSY Left 12/2012   +  . BREAST LUMPECTOMY WITH SENTINEL LYMPH NODE BIOPSY Left 2014  . CHOLECYSTECTOMY    . CYSTOSCOPY W/ URETERAL STENT PLACEMENT Right 01/07/2016   Procedure: CYSTOSCOPY WITH STENT REPLACEMENT;  Surgeon: Hollice Espy, MD;  Location: [facility];  Service: Urology;  Laterality: Right;  . CYSTOSCOPY WITH RETROGRADE PYELOGRAM, URETEROSCOPY AND STENT PLACEMENT Right 12/19/2015   Procedure: CYSTOSCOPY WITH RETROGRADE PYELOGRAM, URETEROSCOPY AND STENT PLACEMENT;  Surgeon: Ardis Hughs, MD;  Location: [facility];  Service: Urology;  Laterality: Right;  . URETEROSCOPY WITH HOLMIUM LASER LITHOTRIPSY Right 01/07/2016   Procedure: URETEROSCOPY WITH HOLMIUM LASER LITHOTRIPSY;  Surgeon: Hollice Espy, MD;  Location: [facility];  Service: Urology;  Laterality: Right;    FAMILY HISTORY :   Family History  Problem Relation Age of Onset  . Breast cancer Mother 63  . Breast cancer Maternal Aunt 68  . Prostate cancer Father     SOCIAL HISTORY:   Social  History  Substance Use Topics  . Smoking status: Never Smoker  . Smokeless tobacco: Never Used  . Alcohol use No    ALLERGIES:  is allergic to sulfa antibiotics.  MEDICATIONS:  Current Outpatient Prescriptions  Medication Sig Dispense Refill  . acetaminophen (TYLENOL) 500 MG tablet Take 500 mg by mouth every 6 (six) hours as needed (for pain.).    Marland Kitchen anastrozole (ARIMIDEX) 1 MG tablet Take 1 tablet (1 mg total) by mouth daily. 30 tablet 6  . aspirin EC 81 MG EC tablet Take 1 tablet (81 mg total) by mouth daily. 30 tablet 2  . Camphor-Menthol-Capsicum (TIGER BALM PAIN RELIEVING) 80-24-16 MG PTCH Place 1 patch onto the skin daily as needed (for knee pain.).    Marland Kitchen ergocalciferol (VITAMIN D2) 50000 units capsule Take 1 capsule (50,000 Units total) by mouth once a week. 24 capsule 1  . furosemide (LASIX) 40 MG tablet Take 40 mg by mouth daily.    Marland Kitchen labetalol (NORMODYNE) 300 MG tablet TAKE 1 TABLET (300 MG TOTAL) BY MOUTH 2 (TWO) TIMES DAILY.    Marland Kitchen levothyroxine (SYNTHROID) 88 MCG tablet Take 88 mcg by mouth daily before breakfast.     . losartan-hydrochlorothiazide (HYZAAR) 100-25 MG tablet Take 1 tablet by mouth daily.    Marland Kitchen octreotide (SANDOSTATIN LAR) 30 MG injection Inject 30 mg into the muscle every 28 (twenty-eight) days.     . potassium chloride SA (K-DUR,KLOR-CON) 20 MEQ tablet Every 8 hours 90 tablet 4  . phenazopyridine (PYRIDIUM) 100 MG tablet Take 1 tablet (100 mg total) by mouth 3 (three) times daily with meals. (Patient not taking: Reported on 03/06/2016) 10 tablet 0   No current facility-administered medications for this visit.    Facility-Administered Medications Ordered in Other Visits  Medication Dose Route Frequency Provider Last Rate Last Dose  . octreotide (SANDOSTATIN LAR) IM injection 20 mg  20 mg Intramuscular Once [name], MD        PHYSICAL EXAMINATION: ECOG PERFORMANCE STATUS: 0 - Asymptomatic  BP 136/63 (BP Location: Right Arm, Patient Position:  Sitting)   Pulse 62   Temp (!) 96.1 F (35.6 C) (Tympanic)   Resp 18   Wt 200 lb 4.6 oz (90.9 kg)   BMI 34.38 kg/m   Filed Weights   04/22/16 1044  Weight: 200 lb 4.6 oz (90.9 kg)   GENERAL: Well-nourished well-developed; Alert, no distress and comfortable.hard of hearing.She is in a wheelchair. She is alone. EYES: no pallor or icterus OROPHARYNX: no thrush or ulceration; good dentition  NECK: supple, no masses felt LYMPH: no palpable lymphadenopathy in the cervical, axillary or inguinal regions LUNGS: clear to auscultation and No wheeze or crackles HEART/CVS: regular rate & rhythm and no murmurs; No lower extremity edema; ABDOMEN:abdomen soft, non-tender and normal bowel sounds Musculoskeletal:no cyanosis of digits and no clubbing  PSYCH: alert & oriented x 3 with fluent speech NEURO: no focal motor/sensory deficits SKIN: no rashes or significant lesions  LABORATORY DATA:  I have reviewed the data as listed  Component Value Date/Time   NA 138 04/22/2016 1011   NA 137 07/12/2013 0841   K 3.4 (L) 04/22/2016 1011   K 4.7 07/12/2013 0841   CL 102 04/22/2016 1011   CL 107 07/12/2013 0841   CO2 29 04/22/2016 1011   CO2 20 (L) 07/12/2013 0841   GLUCOSE 120 (H) 04/22/2016 1011   GLUCOSE 122 (H) 07/12/2013 0841   BUN 24 (H) 04/22/2016 1011   BUN 18 07/12/2013 0841   CREATININE 0.89 04/22/2016 1011   CREATININE 0.64 05/22/2014 1111   CALCIUM 9.2 04/22/2016 1011   CALCIUM 9.2 07/12/2013 0841   PROT 6.6 03/25/2016 1016   PROT 7.0 05/22/2014 1111   ALBUMIN 3.5 03/25/2016 1016   ALBUMIN 4.2 05/22/2014 1111   AST 27 03/25/2016 1016   AST 29 05/22/2014 1111   ALT 13 (L) 03/25/2016 1016   ALT 17 05/22/2014 1111   ALKPHOS 91 03/25/2016 1016   ALKPHOS 95 05/22/2014 1111   BILITOT 0.9 03/25/2016 1016   BILITOT 0.8 05/22/2014 1111   GFRNONAA >60 04/22/2016 1011   GFRNONAA >60 05/22/2014 1111   GFRAA >60 04/22/2016 1011   GFRAA >60 05/22/2014 1111    No results  found for: SPEP, UPEP  Lab Results  Component Value Date   WBC 5.3 04/22/2016   NEUTROABS 3.7 04/22/2016   HGB 11.3 (L) 04/22/2016   HCT 34.3 (L) 04/22/2016   MCV 86.7 04/22/2016   PLT 187 04/22/2016      Chemistry      Component Value Date/Time   NA 138 04/22/2016 1011   NA 137 07/12/2013 0841   K 3.4 (L) 04/22/2016 1011   K 4.7 07/12/2013 0841   CL 102 04/22/2016 1011   CL 107 07/12/2013 0841   CO2 29 04/22/2016 1011   CO2 20 (L) 07/12/2013 0841   BUN 24 (H) 04/22/2016 1011   BUN 18 07/12/2013 0841   CREATININE 0.89 04/22/2016 1011   CREATININE 0.64 05/22/2014 1111      Component Value Date/Time   CALCIUM 9.2 04/22/2016 1011   CALCIUM 9.2 07/12/2013 0841   ALKPHOS 91 03/25/2016 1016   ALKPHOS 95 05/22/2014 1111   AST 27 03/25/2016 1016   AST 29 05/22/2014 1111   ALT 13 (L) 03/25/2016 1016   ALT 17 05/22/2014 1111   BILITOT 0.9 03/25/2016 1016   BILITOT 0.8 05/22/2014 1111       IMPRESSION: 1. Vague heterogeneous 5.0 x 3.3 cm mass within the right hepatic lobe, adjacent to the hepatic hilum, with new intrahepatic biliary ductal dilatation. Smaller 1.7 cm hypodense mass at the left hepatic lobe. Findings are concerning for malignancy, with local metastasis. Hepatocellular carcinoma or cholangiocarcinoma could have such an appearance. Would correlate with lab findings. Dynamic liver protocol MRI or CT is recommended for further evaluation. 2. Minimal right-sided hydronephrosis again noted. Stranding about the right ureter and right renal pelvis is concerning for mild right-sided ureteritis. Perinephric stranding is improved from the prior study. 3. Spiculated 7.1 cm retractile mass again noted at the central small bowel mesentery, relatively stable from 2017 and again concerning for a large carcinoid tumor. 4. Stable 5.4 cm right adnexal cystic focus may reflect a pedunculated fibroid or ovarian fibrothecoma. Uterine fibroid again seen. 5. Peripherally  calcified 3.4 cm splenic cyst again noted. 6. Scattered aortic atherosclerosis.   Electronically Signed   By: Garald Balding M.D.   On: 02/08/2016 02:26   ASSESSMENT & PLAN:  Carcinoid tumor of small intestine, malignant ([place]) #  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 Feb 2018- gallium PET- small bowel mesenteric carcinoid with metastases to the liver; however no uptake in the 5 cm right hepatic lobe mass [see discussion below]  # Continue Octroetide shots monthly. Due for one today.  # Right hepatic lobe mass -"~ 5cm New lesion" on the right hepatic lobe-MRI- imaging reviewed at the tumor conference; clinically thought to be "fat sparing"- recommend no further work up.   # Iron deficiency anemia/Hx of AV malformation- Status post Feraheme April 2017- recent hemoglobin is 12.3.    # LEFT BREAST CA STAGE I; ER/PR positive HER-2/neu negative. Currently on Arimidex.  # Severe hyperkalemia potassium 2.8-likely secondary Lasix 40 mg a day. continue the potassium  20 TID; does not want new script.   # Follow-up with me in 2 months/labs; cbc/cmp/iron studies; sandostain Shots monthly.       [name], MD 04/22/2016 11:32 AM

## 2016-04-22 NOTE — Assessment & Plan Note (Addendum)
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 Feb 2018- gallium PET- small bowel mesenteric carcinoid with metastases to the liver; however no uptake in the 5 cm right hepatic lobe mass [see discussion below]  # Continue Octroetide shots monthly. Due for one today.  # Right hepatic lobe mass -"~ 5cm New lesion" on the right hepatic lobe-MRI- imaging reviewed at the tumor conference; clinically thought to be "fat sparing"- recommend no further work up.   # Iron deficiency anemia/Hx of AV malformation- Status post Feraheme April 2017- recent hemoglobin is 12.3.    # LEFT BREAST CA STAGE I; ER/PR positive HER-2/neu negative. Currently on Arimidex.  # Severe hyperkalemia potassium 2.8-likely secondary Lasix 40 mg a day. continue the potassium  20 TID; does not want new script.   # Follow-up with me in 2 months/labs; cbc/cmp/iron studies; sandostain Shots monthly.

## 2016-04-22 NOTE — Patient Instructions (Signed)

## 2016-04-22 NOTE — Progress Notes (Signed)
Here for follow up. Pt is very HOH. Stated she is doing well.

## 2016-05-13 ENCOUNTER — Ambulatory Visit: Payer: Medicare Other | Attending: Family | Admitting: Family

## 2016-05-13 ENCOUNTER — Encounter: Payer: Self-pay | Admitting: Family

## 2016-05-13 ENCOUNTER — Other Ambulatory Visit: Payer: Self-pay | Admitting: Surgery

## 2016-05-13 VITALS — BP 143/77 | HR 57 | Resp 20 | Ht 65.0 in | Wt 203.4 lb

## 2016-05-13 DIAGNOSIS — Z853 Personal history of malignant neoplasm of breast: Secondary | ICD-10-CM

## 2016-05-13 DIAGNOSIS — E039 Hypothyroidism, unspecified: Secondary | ICD-10-CM | POA: Insufficient documentation

## 2016-05-13 DIAGNOSIS — Z8042 Family history of malignant neoplasm of prostate: Secondary | ICD-10-CM | POA: Insufficient documentation

## 2016-05-13 DIAGNOSIS — Z86718 Personal history of other venous thrombosis and embolism: Secondary | ICD-10-CM | POA: Insufficient documentation

## 2016-05-13 DIAGNOSIS — I5032 Chronic diastolic (congestive) heart failure: Secondary | ICD-10-CM | POA: Insufficient documentation

## 2016-05-13 DIAGNOSIS — Z87442 Personal history of urinary calculi: Secondary | ICD-10-CM | POA: Insufficient documentation

## 2016-05-13 DIAGNOSIS — H903 Sensorineural hearing loss, bilateral: Secondary | ICD-10-CM | POA: Diagnosis not present

## 2016-05-13 DIAGNOSIS — Z882 Allergy status to sulfonamides status: Secondary | ICD-10-CM | POA: Diagnosis not present

## 2016-05-13 DIAGNOSIS — K219 Gastro-esophageal reflux disease without esophagitis: Secondary | ICD-10-CM | POA: Insufficient documentation

## 2016-05-13 DIAGNOSIS — Z9889 Other specified postprocedural states: Secondary | ICD-10-CM | POA: Diagnosis not present

## 2016-05-13 DIAGNOSIS — Z8619 Personal history of other infectious and parasitic diseases: Secondary | ICD-10-CM | POA: Diagnosis not present

## 2016-05-13 DIAGNOSIS — Z90711 Acquired absence of uterus with remaining cervical stump: Secondary | ICD-10-CM | POA: Diagnosis not present

## 2016-05-13 DIAGNOSIS — Z7982 Long term (current) use of aspirin: Secondary | ICD-10-CM | POA: Diagnosis not present

## 2016-05-13 DIAGNOSIS — I130 Hypertensive heart and chronic kidney disease with heart failure and stage 1 through stage 4 chronic kidney disease, or unspecified chronic kidney disease: Secondary | ICD-10-CM | POA: Diagnosis not present

## 2016-05-13 DIAGNOSIS — Z803 Family history of malignant neoplasm of breast: Secondary | ICD-10-CM | POA: Diagnosis not present

## 2016-05-13 DIAGNOSIS — N189 Chronic kidney disease, unspecified: Secondary | ICD-10-CM | POA: Diagnosis not present

## 2016-05-13 DIAGNOSIS — Z9049 Acquired absence of other specified parts of digestive tract: Secondary | ICD-10-CM | POA: Diagnosis not present

## 2016-05-13 DIAGNOSIS — I10 Essential (primary) hypertension: Secondary | ICD-10-CM

## 2016-05-13 DIAGNOSIS — Z1624 Resistance to multiple antibiotics: Secondary | ICD-10-CM | POA: Insufficient documentation

## 2016-05-13 NOTE — Progress Notes (Signed)
Patient ID: Emma [name], female    DOB: 1940/03/27, 76 y.o.   MRN: [id number]  HPI  Ms Metzger is a 76 y/o female with a history of hypothyroidism, DVT, HTN, HOH, kidney stones, hiatal hernia, GERD, CKD, left breast cancer, sepsis and chronic heart failure.  Last echo was done 12/25/15 which showed an EF of 50-55% without any valvular regurgitation.   Was in the ED on 02/07/16 for acute cystitis and was treated and released. She was last admitted on 01/25/16 due to multidrug resistant infection in urinary system. Developed acute HF while hospitalized and was treated with IV diuretics with resolution of her symptoms. ID consult was obtained with recommendation of 14 days of IV antibiotics at discharge via a PICC line. Previous admission was on 12/24/15 with acute on chronic heart failure. Treated with IV diuretics and BP was treated.   She presents today for her follow-up visit with a chief complaint of chronic, mild shortness of breath with moderate exertion. She says that she's been short of breath for years and it is better upon rest. She has associated fatigue, chest tightness and leg edema.   Past Medical History:  Diagnosis Date  . Aromatase inhibitor use   . Breast cancer, left breast ([place]) 06/19/2014  . CHF (congestive heart failure) ([facility])    recent sepsis  . Chronic kidney disease   . Dizziness   . Dyspnea    recently while admitted  . GERD (gastroesophageal reflux disease)   . History of hiatal hernia   . History of kidney stones   . HOH (hard of hearing)    severe  . Hypertension   . Hypothyroidism   . Leg DVT (deep venous thromboembolism), acute ([place]) 07/2015   left leg after fracture  . 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 Pain    chest pain while admitted  . Thyroid disease    Past Surgical History:  Procedure Laterality Date  . ABDOMINAL HYSTERECTOMY     partial  . BREAST EXCISIONAL BIOPSY Left 12/2012   +  . BREAST LUMPECTOMY WITH SENTINEL LYMPH NODE BIOPSY Left  2014  . CHOLECYSTECTOMY    . CYSTOSCOPY W/ URETERAL STENT PLACEMENT Right 01/07/2016   Procedure: CYSTOSCOPY WITH STENT REPLACEMENT;  Surgeon: Hollice Espy, MD;  Location: [facility];  Service: Urology;  Laterality: Right;  . CYSTOSCOPY WITH RETROGRADE PYELOGRAM, URETEROSCOPY AND STENT PLACEMENT Right 12/19/2015   Procedure: CYSTOSCOPY WITH RETROGRADE PYELOGRAM, URETEROSCOPY AND STENT PLACEMENT;  Surgeon: Ardis Hughs, MD;  Location: [facility];  Service: Urology;  Laterality: Right;  . URETEROSCOPY WITH HOLMIUM LASER LITHOTRIPSY Right 01/07/2016   Procedure: URETEROSCOPY WITH HOLMIUM LASER LITHOTRIPSY;  Surgeon: Hollice Espy, MD;  Location: [facility];  Service: Urology;  Laterality: Right;   Family History  Problem Relation Age of Onset  . Breast cancer Mother 58  . Breast cancer Maternal Aunt 68  . Prostate cancer Father    Social History  Substance Use Topics  . Smoking status: Never Smoker  . Smokeless tobacco: Never Used  . Alcohol use No   Allergies  Allergen Reactions  . Sulfa Antibiotics     Other reaction(s): Kidney Disorder Other reaction(s): Kidney Disorder Other reaction(s): Kidney Disorder   Prior to Admission medications   Medication Sig Start Date End Date Taking? Authorizing Provider  acetaminophen (TYLENOL) 500 MG tablet Take 500 mg by mouth every 6 (six) hours as needed (for pain.).   Yes Historical Provider, MD  anastrozole (ARIMIDEX) 1 MG tablet Take  1 tablet (1 mg total) by mouth daily. 11/06/15  Yes [name], MD  aspirin EC 81 MG EC tablet Take 1 tablet (81 mg total) by mouth daily. 12/27/15  Yes Demetrios Loll, MD  Camphor-Menthol-Capsicum (TIGER BALM PAIN RELIEVING) 80-24-16 MG PTCH Place 1 patch onto the skin daily as needed (for knee pain.).   Yes Historical Provider, MD  ergocalciferol (VITAMIN D2) 50000 units capsule Take 1 capsule (50,000 Units total) by mouth once a week. 03/25/16  Yes [name], MD  furosemide (LASIX) 40 MG  tablet Take 40 mg by mouth daily.   Yes Historical Provider, MD  labetalol (NORMODYNE) 300 MG tablet TAKE 1 TABLET (300 MG TOTAL) BY MOUTH 2 (TWO) TIMES DAILY. 08/09/14  Yes Historical Provider, MD  levothyroxine (SYNTHROID) 88 MCG tablet Take 88 mcg by mouth daily before breakfast.  06/20/14  Yes Historical Provider, MD  losartan-hydrochlorothiazide (HYZAAR) 100-25 MG tablet Take 1 tablet by mouth daily.   Yes Historical Provider, MD  octreotide (SANDOSTATIN LAR) 30 MG injection Inject 30 mg into the muscle every 28 (twenty-eight) days.    Yes Historical Provider, MD  phenazopyridine (PYRIDIUM) 100 MG tablet Take 1 tablet (100 mg total) by mouth 3 (three) times daily with meals. 01/29/16  Yes Dustin Flock, MD  potassium chloride SA (K-DUR,KLOR-CON) 20 MEQ tablet Every 8 hours 03/25/16  Yes [name], MD    Review of Systems  Constitutional: Positive for fatigue (improving). Negative for appetite change.  HENT: Positive for congestion and hearing loss. Negative for postnasal drip and sore throat.   Eyes: Negative.   Respiratory: Positive for cough, chest tightness and shortness of breath.   Cardiovascular: Positive for leg swelling (hasn't taken fluid pill yet today). Negative for chest pain and palpitations.  Gastrointestinal: Negative for abdominal distention and abdominal pain.  Endocrine: Negative.   Genitourinary: Negative.   Musculoskeletal: Positive for arthralgias (both knees). Negative for back pain.  Skin: Negative.   Allergic/Immunologic: Negative.   Neurological: Negative for dizziness and light-headedness.  Hematological: Negative for adenopathy. Does not bruise/bleed easily.  Psychiatric/Behavioral: Positive for sleep disturbance (sleeping on 1 pillow; sometimes doesn't sleep well). Negative for dysphoric mood and suicidal ideas. The patient is not nervous/anxious.    Vitals:   05/13/16 1318  BP: (!) 143/77  Pulse: (!) 57  Resp: 20  SpO2: 98%  Weight: 203 lb 6 oz  (92.3 kg)  Height: 5\' 5"  (1.651 m)   Wt Readings from Last 3 Encounters:  05/13/16 203 lb 6 oz (92.3 kg)  04/22/16 200 lb 4.6 oz (90.9 kg)  03/25/16 197 lb 10.3 oz (89.7 kg)   Lab Results  Component Value Date   CREATININE 0.89 04/22/2016   CREATININE 0.97 03/25/2016   CREATININE 1.02 (H) 02/29/2016   Physical Exam  Constitutional: She is oriented to person, place, and time. She appears well-developed and well-nourished.  HENT:  Head: Normocephalic and atraumatic.  Right Ear: Decreased hearing is noted.  Left Ear: Decreased hearing is noted.  Neck: Normal range of motion. Neck supple. No JVD present.  Cardiovascular: Regular rhythm.  Bradycardia present.   Pulmonary/Chest: Effort normal. She has no wheezes. She has no rales.  Abdominal: Soft. She exhibits no distension. There is no tenderness.  Musculoskeletal: She exhibits edema (2+ pitting edema in bilateral lower legs). She exhibits no tenderness.  Neurological: She is alert and oriented to person, place, and time.  Skin: Skin is warm and dry.  Psychiatric: She has a normal mood  and affect. Her behavior is normal. Thought content normal.  Nursing note and vitals reviewed.  Assessment & Plan:  1: Chronic heart failure with preserved ejection fraction- - NYHA class II - mildly fluid overloaded today but she hasn't taken her diuretic yet today. Will take it when she returns home - already weighing daily. Instructed to call for an overnight weight gain of >2 pounds or a weekly weight gain of >5 pounds - weight up 8 pounds by our scale from when she was here last - not adding salt to her food. Currently living with her daughter & son-in-law who is doing the shopping/cooking. Does admit to eating saltier foods at times - saw her cardiologist ([place]) 04/14/16 and returns 08/03/16  2: HTN- - BP looks good today - last saw her PCP Kary Kos) on 01/14/16 and returns to him 05/26/16 - reviewed BMP drawn on 04/22/16; potassium 3.4 and  GFR >60  Patient did not bring her medications nor a list. Each medication was verbally reviewed with the patient and she was encouraged to bring the bottles to every visit to confirm accuracy of list.  Return in 1 month or sooner for any questions/problems before then

## 2016-05-13 NOTE — Patient Instructions (Signed)
Continue weighing daily and call for an overnight weight gain of > 2 pounds or a weekly weight gain of >5 pounds. 

## 2016-05-20 ENCOUNTER — Inpatient Hospital Stay: Payer: Medicare Other

## 2016-05-23 ENCOUNTER — Inpatient Hospital Stay: Payer: Medicare Other | Attending: Internal Medicine

## 2016-05-23 DIAGNOSIS — Q2730 Arteriovenous malformation, site unspecified: Secondary | ICD-10-CM | POA: Diagnosis not present

## 2016-05-23 DIAGNOSIS — E875 Hyperkalemia: Secondary | ICD-10-CM | POA: Diagnosis not present

## 2016-05-23 DIAGNOSIS — D509 Iron deficiency anemia, unspecified: Secondary | ICD-10-CM | POA: Diagnosis not present

## 2016-05-23 DIAGNOSIS — Z79811 Long term (current) use of aromatase inhibitors: Secondary | ICD-10-CM | POA: Diagnosis not present

## 2016-05-23 DIAGNOSIS — Z79899 Other long term (current) drug therapy: Secondary | ICD-10-CM | POA: Diagnosis not present

## 2016-05-23 DIAGNOSIS — C7A019 Malignant carcinoid tumor of the small intestine, unspecified portion: Secondary | ICD-10-CM | POA: Insufficient documentation

## 2016-05-23 DIAGNOSIS — C50912 Malignant neoplasm of unspecified site of left female breast: Secondary | ICD-10-CM | POA: Diagnosis not present

## 2016-05-23 DIAGNOSIS — C787 Secondary malignant neoplasm of liver and intrahepatic bile duct: Secondary | ICD-10-CM | POA: Diagnosis not present

## 2016-05-23 DIAGNOSIS — Z170 Estrogen receptor positive status [ER+]: Secondary | ICD-10-CM | POA: Insufficient documentation

## 2016-05-23 DIAGNOSIS — R160 Hepatomegaly, not elsewhere classified: Secondary | ICD-10-CM | POA: Diagnosis not present

## 2016-05-23 DIAGNOSIS — K6389 Other specified diseases of intestine: Secondary | ICD-10-CM

## 2016-05-23 MED ORDER — OCTREOTIDE ACETATE 20 MG IM KIT
20.0000 mg | PACK | Freq: Once | INTRAMUSCULAR | Status: AC
  Administered 2016-05-23: 20 mg via INTRAMUSCULAR
  Filled 2016-05-23: qty 1

## 2016-06-02 ENCOUNTER — Other Ambulatory Visit: Payer: Self-pay | Admitting: Internal Medicine

## 2016-06-02 DIAGNOSIS — Z79811 Long term (current) use of aromatase inhibitors: Secondary | ICD-10-CM

## 2016-06-13 ENCOUNTER — Ambulatory Visit: Payer: Medicare Other | Attending: Family | Admitting: Family

## 2016-06-13 ENCOUNTER — Encounter: Payer: Self-pay | Admitting: Family

## 2016-06-13 VITALS — BP 141/49 | HR 60 | Resp 18 | Ht 65.0 in | Wt 200.0 lb

## 2016-06-13 DIAGNOSIS — Z87442 Personal history of urinary calculi: Secondary | ICD-10-CM | POA: Diagnosis not present

## 2016-06-13 DIAGNOSIS — Z7982 Long term (current) use of aspirin: Secondary | ICD-10-CM | POA: Insufficient documentation

## 2016-06-13 DIAGNOSIS — K219 Gastro-esophageal reflux disease without esophagitis: Secondary | ICD-10-CM | POA: Insufficient documentation

## 2016-06-13 DIAGNOSIS — I130 Hypertensive heart and chronic kidney disease with heart failure and stage 1 through stage 4 chronic kidney disease, or unspecified chronic kidney disease: Secondary | ICD-10-CM | POA: Insufficient documentation

## 2016-06-13 DIAGNOSIS — Z1624 Resistance to multiple antibiotics: Secondary | ICD-10-CM | POA: Insufficient documentation

## 2016-06-13 DIAGNOSIS — I5032 Chronic diastolic (congestive) heart failure: Secondary | ICD-10-CM

## 2016-06-13 DIAGNOSIS — Z90711 Acquired absence of uterus with remaining cervical stump: Secondary | ICD-10-CM | POA: Insufficient documentation

## 2016-06-13 DIAGNOSIS — H9190 Unspecified hearing loss, unspecified ear: Secondary | ICD-10-CM | POA: Insufficient documentation

## 2016-06-13 DIAGNOSIS — E039 Hypothyroidism, unspecified: Secondary | ICD-10-CM | POA: Diagnosis not present

## 2016-06-13 DIAGNOSIS — Z960 Presence of urogenital implants: Secondary | ICD-10-CM | POA: Insufficient documentation

## 2016-06-13 DIAGNOSIS — Z9889 Other specified postprocedural states: Secondary | ICD-10-CM | POA: Diagnosis not present

## 2016-06-13 DIAGNOSIS — Z881 Allergy status to other antibiotic agents status: Secondary | ICD-10-CM | POA: Insufficient documentation

## 2016-06-13 DIAGNOSIS — Z86718 Personal history of other venous thrombosis and embolism: Secondary | ICD-10-CM | POA: Insufficient documentation

## 2016-06-13 DIAGNOSIS — N189 Chronic kidney disease, unspecified: Secondary | ICD-10-CM | POA: Diagnosis not present

## 2016-06-13 DIAGNOSIS — Z8042 Family history of malignant neoplasm of prostate: Secondary | ICD-10-CM | POA: Diagnosis not present

## 2016-06-13 DIAGNOSIS — Z803 Family history of malignant neoplasm of breast: Secondary | ICD-10-CM | POA: Insufficient documentation

## 2016-06-13 DIAGNOSIS — I10 Essential (primary) hypertension: Secondary | ICD-10-CM

## 2016-06-13 DIAGNOSIS — Z9049 Acquired absence of other specified parts of digestive tract: Secondary | ICD-10-CM | POA: Diagnosis not present

## 2016-06-13 DIAGNOSIS — Z853 Personal history of malignant neoplasm of breast: Secondary | ICD-10-CM | POA: Diagnosis not present

## 2016-06-13 NOTE — Patient Instructions (Signed)
Continue weighing daily and call for an overnight weight gain of > 2 pounds or a weekly weight gain of >5 pounds. 

## 2016-06-13 NOTE — Progress Notes (Signed)
Patient ID: Emma [name], female    DOB: Jun 22, 1940, 76 y.o.   MRN: [id number]  HPI  [name] is a 76 y/o female with a history of hypothyroidism, DVT, HTN, HOH, kidney stones, hiatal hernia, GERD, CKD, left breast cancer, sepsis and chronic heart failure.  Last echo was done 12/25/15 which showed an EF of 50-55% without any valvular regurgitation.   Was in the ED on 02/07/16 for acute cystitis and was treated and released. She was last admitted on 01/25/16 due to multidrug resistant infection in urinary system. Developed acute HF while hospitalized and was treated with IV diuretics with resolution of her symptoms. ID consult was obtained with recommendation of 14 days of IV antibiotics at discharge via a PICC line. Previous admission was on 12/24/15 with acute on chronic heart failure. Treated with IV diuretics and BP was treated.   She presents today for her follow-up visit with a chief complaint of chronic, mild shortness of breath with moderate exertion. She says that she's been short of breath for years and it is better upon rest. She has associated fatigue, chest tightness and leg edema along with this.    Past Medical History:  Diagnosis Date  . Aromatase inhibitor use   . Breast cancer, left breast ([place]) 06/19/2014  . CHF (congestive heart failure) (HCC)    recent sepsis  . Chronic kidney disease   . Dizziness   . Dyspnea    recently while admitted  . GERD (gastroesophageal reflux disease)   . History of hiatal hernia   . History of kidney stones   . HOH (hard of hearing)    severe  . Hypertension   . Hypothyroidism   . Leg DVT (deep venous thromboembolism), acute ([place]) 07/2015   left leg after fracture  . 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 Pain    chest pain while admitted  . Thyroid disease    Past Surgical History:  Procedure Laterality Date  . ABDOMINAL HYSTERECTOMY     partial  . BREAST EXCISIONAL BIOPSY Left 12/2012   +  . BREAST LUMPECTOMY WITH SENTINEL LYMPH  NODE BIOPSY Left 2014  . CHOLECYSTECTOMY    . CYSTOSCOPY W/ URETERAL STENT PLACEMENT Right 01/07/2016   Procedure: CYSTOSCOPY WITH STENT REPLACEMENT;  Surgeon: Hollice Espy, MD;  Location: [facility];  Service: Urology;  Laterality: Right;  . CYSTOSCOPY WITH RETROGRADE PYELOGRAM, URETEROSCOPY AND STENT PLACEMENT Right 12/19/2015   Procedure: CYSTOSCOPY WITH RETROGRADE PYELOGRAM, URETEROSCOPY AND STENT PLACEMENT;  Surgeon: Ardis Hughs, MD;  Location: [facility];  Service: Urology;  Laterality: Right;  . URETEROSCOPY WITH HOLMIUM LASER LITHOTRIPSY Right 01/07/2016   Procedure: URETEROSCOPY WITH HOLMIUM LASER LITHOTRIPSY;  Surgeon: Hollice Espy, MD;  Location: [facility];  Service: Urology;  Laterality: Right;   Family History  Problem Relation Age of Onset  . Breast cancer Mother 16  . Breast cancer Maternal Aunt 68  . Prostate cancer Father    Social History  Substance Use Topics  . Smoking status: Never Smoker  . Smokeless tobacco: Never Used  . Alcohol use No   Allergies  Allergen Reactions  . Sulfa Antibiotics     Other reaction(s): Kidney Disorder Other reaction(s): Kidney Disorder Other reaction(s): Kidney Disorder   Prior to Admission medications   Medication Sig Start Date End Date Taking? Authorizing Provider  acetaminophen (TYLENOL) 500 MG tablet Take 500 mg by mouth every 6 (six) hours as needed (for pain.).   Yes [provider]  anastrozole (ARIMIDEX)  1 MG tablet TAKE 1 TABLET BY MOUTH EVERY DAY 06/02/16  Yes [name], MD  aspirin EC 81 MG EC tablet Take 1 tablet (81 mg total) by mouth daily. 12/27/15  Yes Demetrios Loll, MD  Camphor-Menthol-Capsicum (TIGER BALM PAIN RELIEVING) 80-24-16 MG [facility] 1 patch onto the skin daily as needed (for knee pain.).   Yes [provider]  ergocalciferol (VITAMIN D2) 50000 units capsule Take 1 capsule (50,000 Units total) by mouth once a week. 03/25/16  Yes [name], MD  furosemide (LASIX)  40 MG tablet Take 40 mg by mouth daily.   Yes [provider]  labetalol (NORMODYNE) 300 MG tablet TAKE 1 TABLET (300 MG TOTAL) BY MOUTH 2 (TWO) TIMES DAILY. 08/09/14  Yes [provider]  levothyroxine (SYNTHROID) 88 MCG tablet Take 88 mcg by mouth daily before breakfast.  06/20/14  Yes [provider]  losartan-hydrochlorothiazide (HYZAAR) 100-25 MG tablet Take 1 tablet by mouth daily.   Yes [provider]  octreotide (SANDOSTATIN LAR) 30 MG injection Inject 30 mg into the muscle every 28 (twenty-eight) days.    Yes [provider]  potassium chloride SA (K-DUR,KLOR-CON) 20 MEQ tablet Every 8 hours 03/25/16  Yes [name], MD    Review of Systems  Constitutional: Positive for fatigue (improving). Negative for appetite change.  HENT: Positive for hearing loss. Negative for congestion, postnasal drip and sore throat.   Eyes: Negative.   Respiratory: Positive for shortness of breath (sometimes). Negative for cough and chest tightness.   Cardiovascular: Positive for leg swelling (hasn't taken fluid pill yet today). Negative for chest pain and palpitations.  Gastrointestinal: Negative for abdominal distention and abdominal pain.  Endocrine: Negative.   Genitourinary: Negative.   Musculoskeletal: Positive for arthralgias (both knees). Negative for back pain.  Skin: Negative.   Allergic/Immunologic: Negative.   Neurological: Negative for dizziness and light-headedness.  Hematological: Negative for adenopathy. Does not bruise/bleed easily.  Psychiatric/Behavioral: Positive for sleep disturbance (sleeping on 1 pillow; sometimes doesn't sleep well). Negative for dysphoric mood and suicidal ideas. The patient is not nervous/anxious.    Lab Results  Component Value Date   CREATININE 0.89 04/22/2016   CREATININE 0.97 03/25/2016   CREATININE 1.02 (H) 02/29/2016   Physical Exam  Constitutional: She is oriented to person, place, and time. She  appears well-developed and well-nourished.  HENT:  Head: Normocephalic and atraumatic.  Right Ear: Decreased hearing is noted.  Left Ear: Decreased hearing is noted.  Neck: Normal range of motion. Neck supple. No JVD present.  Cardiovascular: Regular rhythm.  Bradycardia present.   Pulmonary/Chest: Effort normal. She has no wheezes. She has no rales.  Abdominal: Soft. She exhibits no distension. There is no tenderness.  Musculoskeletal: She exhibits edema (1+ pitting edema in bilateral lower legs). She exhibits no tenderness.  Neurological: She is alert and oriented to person, place, and time.  Skin: Skin is warm and dry.  Psychiatric: She has a normal mood and affect. Her behavior is normal. Thought content normal.  Nursing note and vitals reviewed.  Assessment & Plan:  1: Chronic heart failure with preserved ejection fraction- - NYHA class II - mildly fluid overloaded today but she hasn't taken her diuretic yet today. Will take it when she returns home - already weighing daily. Instructed to call for an overnight weight gain of >2 pounds or a weekly weight gain of >5 pounds. By our scale, she's lost 3.2 pounds - not adding salt to her food.  Currently living with her daughter & son-in-law who is doing the shopping/cooking. Does admit to eating saltier foods at times - saw her cardiologist ([place]) 04/14/16 and returns 08/03/16 - encouraged increased activity  2: HTN- - BP looks good today - last saw her PCP Kary Kos) on 05/28/16 - reviewed BMP drawn on 05/26/16; potassium 3.7 and GFR 54  Medication list was reviewed.   Return in 3 months or sooner for any questions/problems before then

## 2016-06-16 ENCOUNTER — Ambulatory Visit
Admission: RE | Admit: 2016-06-16 | Discharge: 2016-06-16 | Disposition: A | Discharge status: Home / Self Care | Payer: Medicare Other | Source: Ambulatory Visit | Attending: Surgery | Admitting: Surgery

## 2016-06-16 DIAGNOSIS — Z853 Personal history of malignant neoplasm of breast: Secondary | ICD-10-CM | POA: Insufficient documentation

## 2016-06-16 HISTORY — DX: Malignant neoplasm of unspecified site of unspecified female breast: C50.919

## 2016-06-17 ENCOUNTER — Ambulatory Visit: Payer: Medicare Other | Admitting: Internal Medicine

## 2016-06-17 ENCOUNTER — Other Ambulatory Visit: Payer: Medicare Other

## 2016-06-17 ENCOUNTER — Ambulatory Visit: Payer: Medicare Other

## 2016-06-19 ENCOUNTER — Inpatient Hospital Stay: Payer: Medicare Other | Attending: Internal Medicine | Admitting: Internal Medicine

## 2016-06-19 ENCOUNTER — Inpatient Hospital Stay: Payer: Medicare Other

## 2016-06-19 ENCOUNTER — Encounter: Payer: Self-pay | Admitting: Internal Medicine

## 2016-06-19 DIAGNOSIS — Z170 Estrogen receptor positive status [ER+]: Secondary | ICD-10-CM | POA: Insufficient documentation

## 2016-06-19 DIAGNOSIS — C50912 Malignant neoplasm of unspecified site of left female breast: Secondary | ICD-10-CM | POA: Diagnosis not present

## 2016-06-19 DIAGNOSIS — N189 Chronic kidney disease, unspecified: Secondary | ICD-10-CM | POA: Insufficient documentation

## 2016-06-19 DIAGNOSIS — Z7982 Long term (current) use of aspirin: Secondary | ICD-10-CM | POA: Diagnosis not present

## 2016-06-19 DIAGNOSIS — I130 Hypertensive heart and chronic kidney disease with heart failure and stage 1 through stage 4 chronic kidney disease, or unspecified chronic kidney disease: Secondary | ICD-10-CM | POA: Insufficient documentation

## 2016-06-19 DIAGNOSIS — Z87442 Personal history of urinary calculi: Secondary | ICD-10-CM | POA: Diagnosis not present

## 2016-06-19 DIAGNOSIS — Z79899 Other long term (current) drug therapy: Secondary | ICD-10-CM | POA: Diagnosis not present

## 2016-06-19 DIAGNOSIS — C7A019 Malignant carcinoid tumor of the small intestine, unspecified portion: Secondary | ICD-10-CM | POA: Diagnosis present

## 2016-06-19 DIAGNOSIS — I509 Heart failure, unspecified: Secondary | ICD-10-CM | POA: Diagnosis not present

## 2016-06-19 DIAGNOSIS — I700 Atherosclerosis of aorta: Secondary | ICD-10-CM | POA: Diagnosis not present

## 2016-06-19 DIAGNOSIS — E039 Hypothyroidism, unspecified: Secondary | ICD-10-CM | POA: Insufficient documentation

## 2016-06-19 DIAGNOSIS — C787 Secondary malignant neoplasm of liver and intrahepatic bile duct: Secondary | ICD-10-CM | POA: Insufficient documentation

## 2016-06-19 DIAGNOSIS — K6389 Other specified diseases of intestine: Secondary | ICD-10-CM

## 2016-06-19 DIAGNOSIS — K449 Diaphragmatic hernia without obstruction or gangrene: Secondary | ICD-10-CM | POA: Insufficient documentation

## 2016-06-19 DIAGNOSIS — K760 Fatty (change of) liver, not elsewhere classified: Secondary | ICD-10-CM

## 2016-06-19 DIAGNOSIS — Z79811 Long term (current) use of aromatase inhibitors: Secondary | ICD-10-CM | POA: Insufficient documentation

## 2016-06-19 DIAGNOSIS — K219 Gastro-esophageal reflux disease without esophagitis: Secondary | ICD-10-CM | POA: Insufficient documentation

## 2016-06-19 DIAGNOSIS — D509 Iron deficiency anemia, unspecified: Secondary | ICD-10-CM | POA: Insufficient documentation

## 2016-06-19 DIAGNOSIS — Z86718 Personal history of other venous thrombosis and embolism: Secondary | ICD-10-CM | POA: Diagnosis not present

## 2016-06-19 DIAGNOSIS — E875 Hyperkalemia: Secondary | ICD-10-CM | POA: Diagnosis not present

## 2016-06-19 LAB — CBC WITH DIFFERENTIAL/PLATELET
Basophils Absolute: 0.1 10*3/uL (ref 0–0.1)
Basophils Relative: 1 %
EOS PCT: 6 %
Eosinophils Absolute: 0.3 10*3/uL (ref 0–0.7)
HCT: 34 % — ABNORMAL LOW (ref 35.0–47.0)
Hemoglobin: 11.5 g/dL — ABNORMAL LOW (ref 12.0–16.0)
LYMPHS ABS: 0.6 10*3/uL — AB (ref 1.0–3.6)
LYMPHS PCT: 11 %
MCH: 28.1 pg (ref 26.0–34.0)
MCHC: 33.9 g/dL (ref 32.0–36.0)
MCV: 83 fL (ref 80.0–100.0)
MONOS PCT: 12 %
Monocytes Absolute: 0.7 10*3/uL (ref 0.2–0.9)
Neutro Abs: 4 10*3/uL (ref 1.4–6.5)
Neutrophils Relative %: 70 %
PLATELETS: 197 10*3/uL (ref 150–440)
RBC: 4.1 MIL/uL (ref 3.80–5.20)
RDW: 14.7 % — ABNORMAL HIGH (ref 11.5–14.5)
WBC: 5.7 10*3/uL (ref 3.6–11.0)

## 2016-06-19 LAB — COMPREHENSIVE METABOLIC PANEL
ALK PHOS: 92 U/L (ref 38–126)
ALT: 15 U/L (ref 14–54)
AST: 27 U/L (ref 15–41)
Albumin: 3.9 g/dL (ref 3.5–5.0)
Anion gap: 8 (ref 5–15)
BUN: 27 mg/dL — ABNORMAL HIGH (ref 6–20)
CALCIUM: 9.4 mg/dL (ref 8.9–10.3)
CHLORIDE: 97 mmol/L — AB (ref 101–111)
CO2: 31 mmol/L (ref 22–32)
CREATININE: 0.96 mg/dL (ref 0.44–1.00)
GFR, EST NON AFRICAN AMERICAN: 56 mL/min — AB (ref >60)
Glucose, Bld: 121 mg/dL — ABNORMAL HIGH (ref 65–99)
Potassium: 3 mmol/L — ABNORMAL LOW (ref 3.5–5.1)
Sodium: 136 mmol/L (ref 135–145)
Total Bilirubin: 1 mg/dL (ref 0.3–1.2)
Total Protein: 7.3 g/dL (ref 6.5–8.1)

## 2016-06-19 LAB — IRON AND TIBC
IRON: 58 ug/dL (ref 28–170)
Saturation Ratios: 12 % (ref 10.4–31.8)
TIBC: 466 ug/dL — ABNORMAL HIGH (ref 250–450)
UIBC: 408 ug/dL

## 2016-06-19 LAB — FERRITIN: FERRITIN: 10 ng/mL — AB (ref 11–307)

## 2016-06-19 LAB — MAGNESIUM: MAGNESIUM: 2.1 mg/dL (ref 1.7–2.4)

## 2016-06-19 MED ORDER — OCTREOTIDE ACETATE 20 MG IM KIT
20.0000 mg | PACK | Freq: Once | INTRAMUSCULAR | Status: AC
  Administered 2016-06-19: 20 mg via INTRAMUSCULAR
  Filled 2016-06-19: qty 1

## 2016-06-19 NOTE — Assessment & Plan Note (Addendum)
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 No clinical progression noted.  Continue Octroetide shots monthly. Due for one today.  # "5 cm right hepatic lobe mass"- noted on CT scans in February 2018; biopsy negative. MRI fatty liver. No further workup is needed.   # Iron deficiency anemia/Hx of AV malformation- Status post Feraheme April 2017- recent hemoglobin is 11. Awaiting Iron studies today.    # LEFT BREAST CA STAGE I; ER/PR positive HER-2/neu negative. May 2018- mammogram- wnl.  Currently on Arimidex.  # Severe hyperkalemia potassium 2.8; secondary Lasix 40 mg a day. Recommen potassium  20 TID; does not want new script.  wiil add Mag.   # Follow-up with me in 2 months/labs; cbc/cmp/iron studies; sandostain Shots monthly.   Addendum: Patient iron studies shows iron deficiency saturation of 12%; ferritin 10. We'll recommend IV iron. Patient will be informed. Magnesium normal at 2.1

## 2016-06-19 NOTE — Progress Notes (Signed)
Patient here for follow up no changes since last appointment 

## 2016-06-19 NOTE — Progress Notes (Signed)
[place] OFFICE PROGRESS NOTE  [name], [name] [name], [name] [name], [name] [name], [name]   SUMMARY OF ONCOLOGIC HISTORY:  Oncology History   # JUNE 2015-MESENTERIC MASS [~5-6cm] Presumed CARCINOID with mets to liver [AUG 2016-Octreoscan; Dr.Hurwitz; [facility]]; FEB 2016- START OCTREOTIDE LAR qM; Octreo scan- FEB 2017- STABLE; cont Octreotide'; OCT 5th OCTREO SCAN- STABLE; mesenteric mass present.   # FEB 2018- Ga PET- uptake in liver/ mesenteric mass  # Jan/FEB 2018- Liver MRI- "fat sparing" Bx- neg; no further wu recom  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] s/p Lumpec & RT; ER/PR > 90%; Her2 Neu-NEG; Arimidex; MAY 2016- Mammo-NEG; May 1st HOLD Arimidex sec to myalgias  # IDA s/p IV iron; last March 2014; [place] [Dr.Elliot; Jan 2016] colon angiotele- s/p Argon laser; April 2017- Ferrahem  # DVT [taken off eliquis DEC 2017]; NOV -DEC 2017 CHF/ UTI with sepsis- stone extracted [Dr.Brandon]  # Thyroid nodule- [facility] s/p Bx [NOV 2016]/ right adnexal mass [stable since 2010]; hard of hearing     Carcinoid tumor of small intestine, malignant ([place])     INTERVAL HISTORY: Patient is a poor historian/hard of hearing. No family.   A very pleasant 76 year old female patient with above history of breast cancer and also mesenteric mass/small bowel carcinoid is here for follow-up.   Patient denies any diarrhea. Denies any nausea vomiting. Denies abdominal bloating. No wheezing. No blood in stools or black stools.  Chronic mild fatigue. Continues to have  chronic arthritic pain in the knees. Not any worse.   REVIEW OF SYSTEMS:  A complete 10 point review of system is done which is negative except mentioned above/history of present illness.   PAST MEDICAL HISTORY :   Past Medical History:  Diagnosis Date  . Aromatase inhibitor use   . Breast cancer ([place]) 2014   left breast cancer/radiation  . Breast cancer, left breast ([place]) 06/19/2014  . CHF (congestive heart failure) (HCC)    recent sepsis  . Chronic kidney disease   . Dizziness   . Dyspnea    recently while admitted  . GERD (gastroesophageal reflux disease)   . History of hiatal hernia   . History of kidney stones   . HOH (hard of hearing)    severe  . Hypertension   . Hypothyroidism   . Leg DVT (deep venous thromboembolism), acute ([place]) 07/2015   left leg after fracture  . 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 Pain    chest pain while admitted  . Thyroid disease     PAST SURGICAL HISTORY :   Past Surgical History:  Procedure Laterality Date  . ABDOMINAL HYSTERECTOMY     partial  . BREAST EXCISIONAL BIOPSY Left 12/2012   +  . BREAST LUMPECTOMY Left 2014  . BREAST LUMPECTOMY WITH SENTINEL LYMPH NODE BIOPSY Left 2014  . CHOLECYSTECTOMY    . CYSTOSCOPY W/ URETERAL STENT PLACEMENT Right 01/07/2016   Procedure: CYSTOSCOPY WITH STENT REPLACEMENT;  Surgeon: Hollice Espy, MD;  Location: [facility];  Service: Urology;  Laterality: Right;  . CYSTOSCOPY WITH RETROGRADE PYELOGRAM, URETEROSCOPY AND STENT PLACEMENT Right 12/19/2015   Procedure: CYSTOSCOPY WITH RETROGRADE PYELOGRAM, URETEROSCOPY AND STENT PLACEMENT;  Surgeon: Ardis Hughs, MD;  Location: [facility];  Service: Urology;  Laterality: Right;  . URETEROSCOPY WITH HOLMIUM LASER LITHOTRIPSY Right 01/07/2016  Procedure: URETEROSCOPY WITH HOLMIUM LASER LITHOTRIPSY;  Surgeon: Hollice Espy, MD;  Location: [facility];  Service: Urology;  Laterality: Right;    FAMILY HISTORY :   Family History  Problem Relation Age of Onset  . Breast cancer Mother 64  . Breast cancer Maternal Aunt 68  . Prostate cancer Father     SOCIAL HISTORY:   Social History  Substance Use Topics  . Smoking status: Never Smoker  . Smokeless tobacco: Never Used   . Alcohol use No    ALLERGIES:  is allergic to sulfa antibiotics.  MEDICATIONS:  Current Outpatient Prescriptions  Medication Sig Dispense Refill  . acetaminophen (TYLENOL) 500 MG tablet Take 500 mg by mouth every 6 (six) hours as needed (for pain.).    Marland Kitchen anastrozole (ARIMIDEX) 1 MG tablet TAKE 1 TABLET BY MOUTH EVERY DAY 30 tablet 1  . aspirin EC 81 MG EC tablet Take 1 tablet (81 mg total) by mouth daily. 30 tablet 2  . Camphor-Menthol-Capsicum (TIGER BALM PAIN RELIEVING) 80-24-16 MG [facility] Place 1 patch onto the skin daily as needed (for knee pain.).    Marland Kitchen ergocalciferol (VITAMIN D2) 50000 units capsule Take 1 capsule (50,000 Units total) by mouth once a week. 24 capsule 1  . furosemide (LASIX) 40 MG tablet Take 40 mg by mouth daily.    Marland Kitchen labetalol (NORMODYNE) 300 MG tablet TAKE 1 TABLET (300 MG TOTAL) BY MOUTH 2 (TWO) TIMES DAILY.    Marland Kitchen levothyroxine (SYNTHROID) 88 MCG tablet Take 88 mcg by mouth daily before breakfast.     . losartan-hydrochlorothiazide (HYZAAR) 100-25 MG tablet Take 1 tablet by mouth daily.    Marland Kitchen octreotide (SANDOSTATIN LAR) 30 MG injection Inject 30 mg into the muscle every 28 (twenty-eight) days.     . potassium chloride SA (K-DUR,KLOR-CON) 20 MEQ tablet Every 8 hours 90 tablet 4   No current facility-administered medications for this visit.     PHYSICAL EXAMINATION: ECOG PERFORMANCE STATUS: 0 - Asymptomatic  BP 126/67 (BP Location: Right Arm, Patient Position: Sitting)   Pulse 62   Temp 97 F (36.1 C) (Tympanic)   Ht _0  (1.651 m)   Wt 210 lb (95.3 kg)   BMI 34.95 kg/m   Filed Weights   06/19/16 1037  Weight: 210 lb (95.3 kg)   GENERAL: Well-nourished well-developed; Alert, no distress and comfortable.hard of hearing.She is in a wheelchair. She is alone. EYES: no pallor or icterus OROPHARYNX: no thrush or ulceration; good dentition  NECK: supple, no masses felt LYMPH: no palpable lymphadenopathy in the cervical, axillary or inguinal  regions LUNGS: clear to auscultation and No wheeze or crackles HEART/CVS: regular rate & rhythm and no murmurs; No lower extremity edema; ABDOMEN:abdomen soft, non-tender and normal bowel sounds Musculoskeletal:no cyanosis of digits and no clubbing  PSYCH: alert & oriented x 3 with fluent speech NEURO: no focal motor/sensory deficits SKIN: no rashes or significant lesions  LABORATORY DATA:  I have reviewed the data as listed    Component Value Date/Time   NA 136 06/19/2016 1000   NA 137 07/12/2013 0841   K 3.0 (L) 06/19/2016 1000   K 4.7 07/12/2013 0841   CL 97 (L) 06/19/2016 1000   CL 107 07/12/2013 0841   CO2 31 06/19/2016 1000   CO2 20 (L) 07/12/2013 0841   GLUCOSE 121 (H) 06/19/2016 1000   GLUCOSE 122 (H) 07/12/2013 0841   BUN 27 (H) 06/19/2016 1000   BUN 18 07/12/2013 0841   CREATININE 0.96 06/19/2016 1000  CREATININE 0.64 05/22/2014 1111   CALCIUM 9.4 06/19/2016 1000   CALCIUM 9.2 07/12/2013 0841   PROT 7.3 06/19/2016 1000   PROT 7.0 05/22/2014 1111   ALBUMIN 3.9 06/19/2016 1000   ALBUMIN 4.2 05/22/2014 1111   AST 27 06/19/2016 1000   AST 29 05/22/2014 1111   ALT 15 06/19/2016 1000   ALT 17 05/22/2014 1111   ALKPHOS 92 06/19/2016 1000   ALKPHOS 95 05/22/2014 1111   BILITOT 1.0 06/19/2016 1000   BILITOT 0.8 05/22/2014 1111   GFRNONAA 56 (L) 06/19/2016 1000   GFRNONAA >60 05/22/2014 1111   GFRAA >60 06/19/2016 1000   GFRAA >60 05/22/2014 1111    No results found for: SPEP, UPEP  Lab Results  Component Value Date   WBC 5.7 06/19/2016   NEUTROABS 4.0 06/19/2016   HGB 11.5 (L) 06/19/2016   HCT 34.0 (L) 06/19/2016   MCV 83.0 06/19/2016   PLT 197 06/19/2016      Chemistry      Component Value Date/Time   NA 136 06/19/2016 1000   NA 137 07/12/2013 0841   K 3.0 (L) 06/19/2016 1000   K 4.7 07/12/2013 0841   CL 97 (L) 06/19/2016 1000   CL 107 07/12/2013 0841   CO2 31 06/19/2016 1000   CO2 20 (L) 07/12/2013 0841   BUN 27 (H) 06/19/2016 1000   BUN  18 07/12/2013 0841   CREATININE 0.96 06/19/2016 1000   CREATININE 0.64 05/22/2014 1111      Component Value Date/Time   CALCIUM 9.4 06/19/2016 1000   CALCIUM 9.2 07/12/2013 0841   ALKPHOS 92 06/19/2016 1000   ALKPHOS 95 05/22/2014 1111   AST 27 06/19/2016 1000   AST 29 05/22/2014 1111   ALT 15 06/19/2016 1000   ALT 17 05/22/2014 1111   BILITOT 1.0 06/19/2016 1000   BILITOT 0.8 05/22/2014 1111       IMPRESSION: 1. Vague heterogeneous 5.0 x 3.3 cm mass within the right hepatic lobe, adjacent to the hepatic hilum, with new intrahepatic biliary ductal dilatation. Smaller 1.7 cm hypodense mass at the left hepatic lobe. Findings are concerning for malignancy, with local metastasis. Hepatocellular carcinoma or cholangiocarcinoma could have such an appearance. Would correlate with lab findings. Dynamic liver protocol MRI or CT is recommended for further evaluation. 2. Minimal right-sided hydronephrosis again noted. Stranding about the right ureter and right renal pelvis is concerning for mild right-sided ureteritis. Perinephric stranding is improved from the prior study. 3. Spiculated 7.1 cm retractile mass again noted at the central small bowel mesentery, relatively stable from 2017 and again concerning for a large carcinoid tumor. 4. Stable 5.4 cm right adnexal cystic focus may reflect a pedunculated fibroid or ovarian fibrothecoma. Uterine fibroid again seen. 5. Peripherally calcified 3.4 cm splenic cyst again noted. 6. Scattered aortic atherosclerosis.   Electronically Signed   By: Garald Balding M.D.   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 No clinical progression noted.  Continue Octroetide  shots monthly. Due for one today.  # "5 cm right hepatic lobe mass"- noted on CT scans in February 2018; biopsy negative. MRI fatty liver. No further workup is needed.   # Iron deficiency anemia/Hx of AV malformation- Status post Feraheme April 2017- recent hemoglobin is 11.  Awaiting Iron studies today.    # LEFT BREAST CA STAGE I; ER/PR positive HER-2/neu negative. May 2018- mammogram- wnl.  Currently on Arimidex.  # Severe hyperkalemia potassium 2.8; secondary Lasix 40 mg a day. Recommen potassium  20 TID; does not want new script.  wiil add Mag.   # Follow-up with me in 2 months/labs; cbc/cmp/iron studies; sandostain Shots monthly.   Addendum: Patient iron studies shows iron deficiency saturation of 12%; ferritin 10. We'll recommend IV iron. Patient will be informed. Magnesium normal at 2.1       [name], MD 06/23/2016 3:24 PM

## 2016-06-23 ENCOUNTER — Telehealth: Payer: Self-pay | Admitting: Internal Medicine

## 2016-06-23 ENCOUNTER — Other Ambulatory Visit: Payer: Self-pay | Admitting: Internal Medicine

## 2016-06-23 NOTE — Telephone Encounter (Signed)
Please inform pt that she needs IV venofer [total 3 infusion]; please schedule starting next week x1; and also on 6/21; 7/26 [with sandostatin shots]

## 2016-06-24 NOTE — Telephone Encounter (Signed)
Notified pt's daughter of the need for IV venofer and to be expecting a call from our scheduling team with these appts   [place]:  Please schedule as per Dr Aletha Halim request.

## 2016-06-26 ENCOUNTER — Inpatient Hospital Stay: Payer: Medicare Other

## 2016-06-26 VITALS — BP 132/71 | HR 60 | Temp 96.6°F | Resp 18

## 2016-06-26 DIAGNOSIS — K6389 Other specified diseases of intestine: Secondary | ICD-10-CM

## 2016-06-26 DIAGNOSIS — C7A019 Malignant carcinoid tumor of the small intestine, unspecified portion: Secondary | ICD-10-CM | POA: Diagnosis not present

## 2016-06-26 MED ORDER — IRON SUCROSE 20 MG/ML IV SOLN
200.0000 mg | Freq: Once | INTRAVENOUS | Status: AC
  Administered 2016-06-26: 200 mg via INTRAVENOUS
  Filled 2016-06-26: qty 10

## 2016-06-26 MED ORDER — SODIUM CHLORIDE 0.9 % IV SOLN
Freq: Once | INTRAVENOUS | Status: AC
  Administered 2016-06-26: 14:00:00 via INTRAVENOUS
  Filled 2016-06-26: qty 1000

## 2016-06-26 MED ORDER — SODIUM CHLORIDE 0.9 % IV SOLN: 200.0000 mg | Freq: Once | INTRAVENOUS | Status: DC

## 2016-07-17 ENCOUNTER — Inpatient Hospital Stay: Payer: Medicare Other | Attending: Internal Medicine

## 2016-07-17 VITALS — BP 121/60 | HR 60 | Temp 97.1°F | Resp 18

## 2016-07-17 DIAGNOSIS — I700 Atherosclerosis of aorta: Secondary | ICD-10-CM | POA: Insufficient documentation

## 2016-07-17 DIAGNOSIS — Z79811 Long term (current) use of aromatase inhibitors: Secondary | ICD-10-CM | POA: Diagnosis not present

## 2016-07-17 DIAGNOSIS — K219 Gastro-esophageal reflux disease without esophagitis: Secondary | ICD-10-CM | POA: Insufficient documentation

## 2016-07-17 DIAGNOSIS — Z7982 Long term (current) use of aspirin: Secondary | ICD-10-CM | POA: Diagnosis not present

## 2016-07-17 DIAGNOSIS — Z170 Estrogen receptor positive status [ER+]: Secondary | ICD-10-CM | POA: Diagnosis not present

## 2016-07-17 DIAGNOSIS — C787 Secondary malignant neoplasm of liver and intrahepatic bile duct: Secondary | ICD-10-CM | POA: Diagnosis not present

## 2016-07-17 DIAGNOSIS — N189 Chronic kidney disease, unspecified: Secondary | ICD-10-CM | POA: Diagnosis not present

## 2016-07-17 DIAGNOSIS — E875 Hyperkalemia: Secondary | ICD-10-CM | POA: Diagnosis not present

## 2016-07-17 DIAGNOSIS — C50912 Malignant neoplasm of unspecified site of left female breast: Secondary | ICD-10-CM | POA: Insufficient documentation

## 2016-07-17 DIAGNOSIS — E039 Hypothyroidism, unspecified: Secondary | ICD-10-CM | POA: Insufficient documentation

## 2016-07-17 DIAGNOSIS — C7A019 Malignant carcinoid tumor of the small intestine, unspecified portion: Secondary | ICD-10-CM | POA: Diagnosis present

## 2016-07-17 DIAGNOSIS — K449 Diaphragmatic hernia without obstruction or gangrene: Secondary | ICD-10-CM | POA: Insufficient documentation

## 2016-07-17 DIAGNOSIS — K6389 Other specified diseases of intestine: Secondary | ICD-10-CM

## 2016-07-17 DIAGNOSIS — Z86718 Personal history of other venous thrombosis and embolism: Secondary | ICD-10-CM | POA: Diagnosis not present

## 2016-07-17 DIAGNOSIS — Z87442 Personal history of urinary calculi: Secondary | ICD-10-CM | POA: Diagnosis not present

## 2016-07-17 DIAGNOSIS — Z79899 Other long term (current) drug therapy: Secondary | ICD-10-CM | POA: Insufficient documentation

## 2016-07-17 DIAGNOSIS — I130 Hypertensive heart and chronic kidney disease with heart failure and stage 1 through stage 4 chronic kidney disease, or unspecified chronic kidney disease: Secondary | ICD-10-CM | POA: Insufficient documentation

## 2016-07-17 DIAGNOSIS — D509 Iron deficiency anemia, unspecified: Secondary | ICD-10-CM | POA: Diagnosis not present

## 2016-07-17 DIAGNOSIS — K760 Fatty (change of) liver, not elsewhere classified: Secondary | ICD-10-CM | POA: Insufficient documentation

## 2016-07-17 DIAGNOSIS — I509 Heart failure, unspecified: Secondary | ICD-10-CM | POA: Insufficient documentation

## 2016-07-17 MED ORDER — IRON SUCROSE 20 MG/ML IV SOLN
200.0000 mg | Freq: Once | INTRAVENOUS | Status: AC
  Administered 2016-07-17: 200 mg via INTRAVENOUS
  Filled 2016-07-17: qty 10

## 2016-07-17 MED ORDER — IRON SUCROSE 20 MG/ML IV SOLN: 200.0000 mg | Freq: Once | INTRAVENOUS | Status: DC

## 2016-07-17 MED ORDER — OCTREOTIDE ACETATE 20 MG IM KIT
20.0000 mg | PACK | Freq: Once | INTRAMUSCULAR | Status: AC
  Administered 2016-07-17: 20 mg via INTRAMUSCULAR
  Filled 2016-07-17: qty 1

## 2016-07-17 MED ORDER — SODIUM CHLORIDE 0.9 % IV SOLN
Freq: Once | INTRAVENOUS | Status: AC
  Administered 2016-07-17: 12:00:00 via INTRAVENOUS
  Filled 2016-07-17: qty 1000

## 2016-08-20 ENCOUNTER — Other Ambulatory Visit: Payer: Self-pay | Admitting: Internal Medicine

## 2016-08-20 DIAGNOSIS — Z79811 Long term (current) use of aromatase inhibitors: Secondary | ICD-10-CM

## 2016-08-21 ENCOUNTER — Encounter: Payer: Self-pay | Admitting: Internal Medicine

## 2016-08-21 ENCOUNTER — Inpatient Hospital Stay: Payer: Medicare Other

## 2016-08-21 ENCOUNTER — Inpatient Hospital Stay: Payer: Medicare Other | Attending: Internal Medicine

## 2016-08-21 ENCOUNTER — Inpatient Hospital Stay (HOSPITAL_BASED_OUTPATIENT_CLINIC_OR_DEPARTMENT_OTHER): Payer: Medicare Other | Admitting: Internal Medicine

## 2016-08-21 VITALS — BP 139/68 | HR 53 | Temp 97.6°F | Resp 20 | Ht 65.0 in | Wt 207.0 lb

## 2016-08-21 DIAGNOSIS — E039 Hypothyroidism, unspecified: Secondary | ICD-10-CM | POA: Diagnosis not present

## 2016-08-21 DIAGNOSIS — I130 Hypertensive heart and chronic kidney disease with heart failure and stage 1 through stage 4 chronic kidney disease, or unspecified chronic kidney disease: Secondary | ICD-10-CM | POA: Diagnosis not present

## 2016-08-21 DIAGNOSIS — C50912 Malignant neoplasm of unspecified site of left female breast: Secondary | ICD-10-CM

## 2016-08-21 DIAGNOSIS — Z79899 Other long term (current) drug therapy: Secondary | ICD-10-CM | POA: Diagnosis not present

## 2016-08-21 DIAGNOSIS — K449 Diaphragmatic hernia without obstruction or gangrene: Secondary | ICD-10-CM | POA: Insufficient documentation

## 2016-08-21 DIAGNOSIS — N189 Chronic kidney disease, unspecified: Secondary | ICD-10-CM | POA: Insufficient documentation

## 2016-08-21 DIAGNOSIS — Z86718 Personal history of other venous thrombosis and embolism: Secondary | ICD-10-CM | POA: Diagnosis not present

## 2016-08-21 DIAGNOSIS — C7A019 Malignant carcinoid tumor of the small intestine, unspecified portion: Secondary | ICD-10-CM | POA: Insufficient documentation

## 2016-08-21 DIAGNOSIS — Z87442 Personal history of urinary calculi: Secondary | ICD-10-CM | POA: Insufficient documentation

## 2016-08-21 DIAGNOSIS — Z923 Personal history of irradiation: Secondary | ICD-10-CM

## 2016-08-21 DIAGNOSIS — Z7982 Long term (current) use of aspirin: Secondary | ICD-10-CM | POA: Diagnosis not present

## 2016-08-21 DIAGNOSIS — K760 Fatty (change of) liver, not elsewhere classified: Secondary | ICD-10-CM | POA: Insufficient documentation

## 2016-08-21 DIAGNOSIS — Z79811 Long term (current) use of aromatase inhibitors: Secondary | ICD-10-CM

## 2016-08-21 DIAGNOSIS — C787 Secondary malignant neoplasm of liver and intrahepatic bile duct: Secondary | ICD-10-CM

## 2016-08-21 DIAGNOSIS — Z170 Estrogen receptor positive status [ER+]: Secondary | ICD-10-CM | POA: Insufficient documentation

## 2016-08-21 DIAGNOSIS — I700 Atherosclerosis of aorta: Secondary | ICD-10-CM | POA: Diagnosis not present

## 2016-08-21 DIAGNOSIS — D509 Iron deficiency anemia, unspecified: Secondary | ICD-10-CM

## 2016-08-21 DIAGNOSIS — E876 Hypokalemia: Secondary | ICD-10-CM | POA: Diagnosis not present

## 2016-08-21 DIAGNOSIS — K6389 Other specified diseases of intestine: Secondary | ICD-10-CM

## 2016-08-21 DIAGNOSIS — I509 Heart failure, unspecified: Secondary | ICD-10-CM | POA: Diagnosis not present

## 2016-08-21 DIAGNOSIS — K219 Gastro-esophageal reflux disease without esophagitis: Secondary | ICD-10-CM | POA: Diagnosis not present

## 2016-08-21 LAB — IRON AND TIBC
Iron: 68 ug/dL (ref 28–170)
SATURATION RATIOS: 18 % (ref 10.4–31.8)
TIBC: 379 ug/dL (ref 250–450)
UIBC: 311 ug/dL

## 2016-08-21 LAB — CBC WITH DIFFERENTIAL/PLATELET
BASOS PCT: 1 %
Basophils Absolute: 0.1 10*3/uL (ref 0–0.1)
EOS ABS: 0.4 10*3/uL (ref 0–0.7)
Eosinophils Relative: 7 %
HEMATOCRIT: 34.8 % — AB (ref 35.0–47.0)
Hemoglobin: 11.9 g/dL — ABNORMAL LOW (ref 12.0–16.0)
LYMPHS ABS: 0.7 10*3/uL — AB (ref 1.0–3.6)
Lymphocytes Relative: 13 %
MCH: 29.2 pg (ref 26.0–34.0)
MCHC: 34.3 g/dL (ref 32.0–36.0)
MCV: 85.2 fL (ref 80.0–100.0)
MONO ABS: 0.7 10*3/uL (ref 0.2–0.9)
MONOS PCT: 13 %
Neutro Abs: 3.6 10*3/uL (ref 1.4–6.5)
Neutrophils Relative %: 66 %
Platelets: 180 10*3/uL (ref 150–440)
RBC: 4.08 MIL/uL (ref 3.80–5.20)
RDW: 17.1 % — AB (ref 11.5–14.5)
WBC: 5.4 10*3/uL (ref 3.6–11.0)

## 2016-08-21 LAB — COMPREHENSIVE METABOLIC PANEL
ALBUMIN: 4 g/dL (ref 3.5–5.0)
ALK PHOS: 86 U/L (ref 38–126)
ALT: 15 U/L (ref 14–54)
ANION GAP: 7 (ref 5–15)
AST: 26 U/L (ref 15–41)
BILIRUBIN TOTAL: 0.8 mg/dL (ref 0.3–1.2)
BUN: 24 mg/dL — ABNORMAL HIGH (ref 6–20)
CALCIUM: 9.3 mg/dL (ref 8.9–10.3)
CO2: 31 mmol/L (ref 22–32)
Chloride: 99 mmol/L — ABNORMAL LOW (ref 101–111)
Creatinine, Ser: 0.96 mg/dL (ref 0.44–1.00)
GFR calc non Af Amer: 56 mL/min — ABNORMAL LOW (ref >60)
Glucose, Bld: 104 mg/dL — ABNORMAL HIGH (ref 65–99)
POTASSIUM: 2.9 mmol/L — AB (ref 3.5–5.1)
SODIUM: 137 mmol/L (ref 135–145)
TOTAL PROTEIN: 6.5 g/dL (ref 6.5–8.1)

## 2016-08-21 LAB — FERRITIN: Ferritin: 36 ng/mL (ref 11–307)

## 2016-08-21 MED ORDER — OCTREOTIDE ACETATE 20 MG IM KIT
20.0000 mg | PACK | Freq: Once | INTRAMUSCULAR | Status: AC
  Administered 2016-08-21: 20 mg via INTRAMUSCULAR
  Filled 2016-08-21: qty 1

## 2016-08-21 NOTE — Progress Notes (Signed)
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 No family.   A very pleasant 76 year old female patient with above history of breast cancer and also mesenteric mass/small bowel carcinoid is here for follow-up.   Patient denies any diarrhea. Denies any nausea vomiting. Denies abdominal bloating. No wheezing. No blood in stools or black stools.  Chronic mild fatigue. Continues to have  chronic arthritic pain in the knees. Not any worse.   REVIEW OF SYSTEMS:  A complete 10 point review of system is done which is negative except mentioned above/history of present illness.   PAST MEDICAL HISTORY :   Past Medical History:  Diagnosis Date  . Aromatase inhibitor use   . Breast cancer ([place] [facility]) 2014   left breast cancer/radiation  . Breast cancer, left breast ([place]) 06/19/2014  . CHF (congestive heart failure) (H[facility])    recent sepsis  . Chronic kidney disease   . Dizziness   . Dyspnea    recently while admitted  . GERD (gastroesophageal reflux disease)   . Hearing loss   . History of hiatal hernia   . History of kidney stones   . HOH (hard of hearing)    severe  . Hypertension   . Hypothyroidism   . Leg DVT (deep venous thromboembolism), acute ([place]) 07/2015   left leg after fracture  . 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 Pain    chest pain while admitted  . Thyroid disease     PAST SURGICAL HISTORY :   Past Surgical History:  Procedure Laterality Date  . ABDOMINAL HYSTERECTOMY     partial  . BREAST EXCISIONAL BIOPSY Left 12/2012   +  . BREAST LUMPECTOMY Left 2014  . BREAST LUMPECTOMY WITH SENTINEL LYMPH NODE BIOPSY Left 2014  . CHOLECYSTECTOMY    . CYSTOSCOPY W/ URETERAL STENT PLACEMENT Right 01/07/2016   Procedure: CYSTOSCOPY WITH STENT REPLACEMENT;  Surgeon: Hollice Espy, MD;  Location: [facility] ORS;  Service: Urology;  Laterality: Right;  . CYSTOSCOPY WITH RETROGRADE PYELOGRAM, URETEROSCOPY AND STENT PLACEMENT Right 12/19/2015   Procedure: CYSTOSCOPY WITH RETROGRADE PYELOGRAM, URETEROSCOPY AND STENT PLACEMENT;  Surgeon: Ardis Hughs, MD;  Location: [facility] OR[facility];  Service: Urology;  Laterality: Right;  . URETEROSCOPY WITH  HOLMIUM LASER LITHOTRIPSY Right 01/07/2016   Procedure: URETEROSCOPY WITH HOLMIUM LASER LITHOTRIPSY;  Surgeon: Hollice Espy, MD;  Location: [facility] OR[facility];  Service: Urology;  Laterality: Right;    FAMILY HISTORY :   Family History  Problem Relation Age of Onset  . Breast cancer Mother 55  . Breast cancer Maternal Aunt 68  . Prostate cancer Father     SOCIAL HISTORY:   Social History  Substance Use Topics  . Smoking status: Never Smoker  . Smokeless  tobacco: Never Used  . Alcohol use No    ALLERGIES:  is allergic to sulfa antibiotics.  MEDICATIONS:  Current Outpatient Prescriptions  Medication Sig Dispense Refill  . acetaminophen (TYLENOL) 500 MG tablet Take 500 mg by mouth every 6 (six) hours as needed (for pain.).    Marland Kitchen anastrozole (ARIMIDEX) 1 MG tablet TAKE 1 TABLET BY MOUTH EVERY DAY 30 tablet 0  . aspirin EC 81 MG EC tablet Take 1 tablet (81 mg total) by mouth daily. 30 tablet 2  . Camphor-Menthol-Capsicum (TIGER BALM PAIN RELIEVING) 80-24-16 MG [facility] Place 1 patch onto the skin daily as needed (for knee pain.).    Marland Kitchen ergocalciferol (VITAMIN D2) 50000 units capsule Take 1 capsule (50,000 Units total) by mouth once a week. 24 capsule 1  . furosemide (LASIX) 40 MG tablet Take 40 mg by mouth daily.    Marland Kitchen labetalol (NORMODYNE) 200 MG tablet TAKE 1 TABLET (300 MG TOTAL) BY MOUTH 2 (TWO) TIMES DAILY.    Marland Kitchen levothyroxine (SYNTHROID) 88 MCG tablet Take 88 mcg by mouth daily before breakfast.     . losartan-hydrochlorothiazide (HYZAAR) 100-25 MG tablet Take 1 tablet by mouth daily.    Marland Kitchen octreotide (SANDOSTATIN LAR) 30 MG injection Inject 30 mg into the muscle every 28 (twenty-eight) days.     . potassium chloride SA (K-DUR,KLOR-CON) 20 MEQ tablet Every 8 hours (Patient taking differently: Take 20 mEq by mouth 3 (three) times daily. Every 8 hours) 90 tablet 4   No current facility-administered medications for this visit.     PHYSICAL EXAMINATION: ECOG PERFORMANCE STATUS: 0 - Asymptomatic  BP 139/68 (BP Location: Left Arm, Patient Position: Sitting)   Pulse (!) 53   Temp 97.6 F (36.4 C) (Tympanic)   Resp 20   Ht 5' 5"  (1.651 m)   Wt 207 lb (93.9 kg)   BMI 34.45 kg/m   Filed Weights   08/21/16 1058  Weight: 207 lb (93.9 kg)   GENERAL: Well-nourished well-developed; Alert, no distress and comfortable.hard of hearing.She is in a wheelchair. She is alone. EYES: no pallor or icterus OROPHARYNX: no thrush or ulceration; good  dentition  NECK: supple, no masses felt LYMPH: no palpable lymphadenopathy in the cervical, axillary or inguinal regions LUNGS: clear to auscultation and No wheeze or crackles HEART/CVS: regular rate & rhythm and no murmurs; No lower extremity edema; ABDOMEN:abdomen soft, non-tender and normal bowel sounds Musculoskeletal:no cyanosis of digits and no clubbing  PSYCH: alert & oriented x 3 with fluent speech NEURO: no focal motor/sensory deficits SKIN: no rashes or significant lesions  LABORATORY DATA:  I have reviewed the data as listed    Component Value Date/Time   NA 137 08/21/2016 1010   NA 137 07/12/2013 0841   K 2.9 (L) 08/21/2016 1010   K 4.7 07/12/2013 0841   CL 99 (L) 08/21/2016 1010   CL 107 07/12/2013 0841   CO2 31 08/21/2016 1010   CO2 20 (L) 07/12/2013 0841   GLUCOSE 104 (H) 08/21/2016  1010   GLUCOSE 122 (H) 07/12/2013 0841   BUN 24 (H) 08/21/2016 1010   BUN 18 07/12/2013 0841   CREATININE 0.96 08/21/2016 1010   CREATININE 0.64 05/22/2014 1111   CALCIUM 9.3 08/21/2016 1010   CALCIUM 9.2 07/12/2013 0841   PROT 6.5 08/21/2016 1010   PROT 7.0 05/22/2014 1111   ALBUMIN 4.0 08/21/2016 1010   ALBUMIN 4.2 05/22/2014 1111   AST 26 08/21/2016 1010   AST 29 05/22/2014 1111   ALT 15 08/21/2016 1010   ALT 17 05/22/2014 1111   ALKPHOS 86 08/21/2016 1010   ALKPHOS 95 05/22/2014 1111   BILITOT 0.8 08/21/2016 1010   BILITOT 0.8 05/22/2014 1111   GFRNONAA 56 (L) 08/21/2016 1010   GFRNONAA >60 05/22/2014 1111   GFRAA >60 08/21/2016 1010   GFRAA >60 05/22/2014 1111    No results found for: SPEP, UPEP  Lab Results  Component Value Date   WBC 5.4 08/21/2016   NEUTROABS 3.6 08/21/2016   HGB 11.9 (L) 08/21/2016   HCT 34.8 (L) 08/21/2016   MCV 85.2 08/21/2016   PLT 180 08/21/2016      Chemistry      Component Value Date/Time   NA 137 08/21/2016 1010   NA 137 07/12/2013 0841   K 2.9 (L) 08/21/2016 1010   K 4.7 07/12/2013 0841   CL 99 (L) 08/21/2016 1010    CL 107 07/12/2013 0841   CO2 31 08/21/2016 1010   CO2 20 (L) 07/12/2013 0841   BUN 24 (H) 08/21/2016 1010   BUN 18 07/12/2013 0841   CREATININE 0.96 08/21/2016 1010   CREATININE 0.64 05/22/2014 1111      Component Value Date/Time   CALCIUM 9.3 08/21/2016 1010   CALCIUM 9.2 07/12/2013 0841   ALKPHOS 86 08/21/2016 1010   ALKPHOS 95 05/22/2014 1111   AST 26 08/21/2016 1010   AST 29 05/22/2014 1111   ALT 15 08/21/2016 1010   ALT 17 05/22/2014 1111   BILITOT 0.8 08/21/2016 1010   BILITOT 0.8 05/22/2014 1111       IMPRESSION: 1. Vague heterogeneous 5.0 x 3.3 cm mass within the right hepatic lobe, adjacent to the hepatic hilum, with new intrahepatic biliary ductal dilatation. Smaller 1.7 cm hypodense mass at the left hepatic lobe. Findings are concerning for malignancy, with local metastasis. Hepatocellular carcinoma or cholangiocarcinoma could have such an appearance. Would correlate with lab findings. Dynamic liver protocol MRI or CT is recommended for further evaluation. 2. Minimal right-sided hydronephrosis again noted. Stranding about the right ureter and right renal pelvis is concerning for mild right-sided ureteritis. Perinephric stranding is improved from the prior study. 3. Spiculated 7.1 cm retractile mass again noted at the central small bowel mesentery, relatively stable from 2017 and again concerning for a large carcinoid tumor. 4. Stable 5.4 cm right adnexal cystic focus may reflect a pedunculated fibroid or ovarian fibrothecoma. Uterine fibroid again seen. 5. Peripherally calcified 3.4 cm splenic cyst again noted. 6. Scattered aortic atherosclerosis.   Electronically Signed   By: Garald Balding M.D.   On: 02/08/2016 02:26   ASSESSMENT & PLAN:  Carcinoid tumor of small intestine, malignant ([place]) # 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 Feb 2018- gallium PET-  small bowel mesenteric carcinoid with metastases to the liver. No clinical progression noted.  Continue Octroetide shots monthly. Due for one today.  # Iron deficiency anemia/Hx of AV malformation- Status post Feraheme April 2017- today  hemoglobin is 11.9; saturation 18% ferritin 36. Continue oral iron.  # LEFT BREAST CA STAGE I; ER/PR positive HER-2/neu negative. May 2018- mammogram- wnl.  Currently on Arimidex.  # Severe hypokalemia potassium 2.9; secondary Lasix 40 mg a day. Recommend potassium  20 TID; does not want new script.  wiil add Mag.   # Follow-up with me in 2 months/labs; cbc/cmp/iron studies; sandostain Shots monthly.   Cc; Dr.Paraschoes.        [name], MD 08/26/2016 8:53 AM

## 2016-08-21 NOTE — Progress Notes (Signed)
Patient here for follow-up with h/o carcinoid-small intestine. She is here for sandostatin injection.

## 2016-08-21 NOTE — Assessment & Plan Note (Addendum)
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 No clinical progression noted.  Continue Octroetide shots monthly. Due for one today.  # Iron deficiency anemia/Hx of AV malformation- Status post Feraheme April 2017- today hemoglobin is 11.9; saturation 18% ferritin 36. Continue oral iron.  # LEFT BREAST CA STAGE I; ER/PR positive HER-2/neu negative. May 2018- mammogram- wnl.  Currently on Arimidex.  # Severe hypokalemia potassium 2.9; secondary Lasix 40 mg a day. Recommend potassium  20 TID; does not want new script.  wiil add Mag.   # Follow-up with me in 2 months/labs; cbc/cmp/iron studies; sandostain Shots monthly.   Cc; Dr.Paraschoes.

## 2016-09-18 ENCOUNTER — Inpatient Hospital Stay: Payer: Medicare Other

## 2016-09-18 ENCOUNTER — Inpatient Hospital Stay: Payer: Medicare Other | Attending: Internal Medicine

## 2016-09-18 DIAGNOSIS — Z79899 Other long term (current) drug therapy: Secondary | ICD-10-CM | POA: Insufficient documentation

## 2016-09-18 DIAGNOSIS — N189 Chronic kidney disease, unspecified: Secondary | ICD-10-CM | POA: Diagnosis not present

## 2016-09-18 DIAGNOSIS — C787 Secondary malignant neoplasm of liver and intrahepatic bile duct: Secondary | ICD-10-CM | POA: Insufficient documentation

## 2016-09-18 DIAGNOSIS — E876 Hypokalemia: Secondary | ICD-10-CM | POA: Insufficient documentation

## 2016-09-18 DIAGNOSIS — Z7982 Long term (current) use of aspirin: Secondary | ICD-10-CM | POA: Diagnosis not present

## 2016-09-18 DIAGNOSIS — Z923 Personal history of irradiation: Secondary | ICD-10-CM | POA: Diagnosis not present

## 2016-09-18 DIAGNOSIS — I700 Atherosclerosis of aorta: Secondary | ICD-10-CM | POA: Insufficient documentation

## 2016-09-18 DIAGNOSIS — K449 Diaphragmatic hernia without obstruction or gangrene: Secondary | ICD-10-CM | POA: Diagnosis not present

## 2016-09-18 DIAGNOSIS — Z86718 Personal history of other venous thrombosis and embolism: Secondary | ICD-10-CM | POA: Diagnosis not present

## 2016-09-18 DIAGNOSIS — C7A019 Malignant carcinoid tumor of the small intestine, unspecified portion: Secondary | ICD-10-CM | POA: Diagnosis present

## 2016-09-18 DIAGNOSIS — E039 Hypothyroidism, unspecified: Secondary | ICD-10-CM | POA: Diagnosis not present

## 2016-09-18 DIAGNOSIS — Z87442 Personal history of urinary calculi: Secondary | ICD-10-CM | POA: Insufficient documentation

## 2016-09-18 DIAGNOSIS — Z79811 Long term (current) use of aromatase inhibitors: Secondary | ICD-10-CM | POA: Insufficient documentation

## 2016-09-18 DIAGNOSIS — C50912 Malignant neoplasm of unspecified site of left female breast: Secondary | ICD-10-CM | POA: Diagnosis not present

## 2016-09-18 DIAGNOSIS — I130 Hypertensive heart and chronic kidney disease with heart failure and stage 1 through stage 4 chronic kidney disease, or unspecified chronic kidney disease: Secondary | ICD-10-CM | POA: Insufficient documentation

## 2016-09-18 DIAGNOSIS — D509 Iron deficiency anemia, unspecified: Secondary | ICD-10-CM | POA: Insufficient documentation

## 2016-09-18 DIAGNOSIS — K760 Fatty (change of) liver, not elsewhere classified: Secondary | ICD-10-CM | POA: Diagnosis not present

## 2016-09-18 DIAGNOSIS — Z170 Estrogen receptor positive status [ER+]: Secondary | ICD-10-CM | POA: Insufficient documentation

## 2016-09-18 DIAGNOSIS — I509 Heart failure, unspecified: Secondary | ICD-10-CM | POA: Insufficient documentation

## 2016-09-18 DIAGNOSIS — K219 Gastro-esophageal reflux disease without esophagitis: Secondary | ICD-10-CM | POA: Insufficient documentation

## 2016-09-18 DIAGNOSIS — K6389 Other specified diseases of intestine: Secondary | ICD-10-CM

## 2016-09-18 LAB — CBC WITH DIFFERENTIAL/PLATELET
BASOS ABS: 0.1 10*3/uL (ref 0–0.1)
Basophils Relative: 1 %
EOS PCT: 4 %
Eosinophils Absolute: 0.2 10*3/uL (ref 0–0.7)
HCT: 35.3 % (ref 35.0–47.0)
HEMOGLOBIN: 12.2 g/dL (ref 12.0–16.0)
LYMPHS ABS: 0.5 10*3/uL — AB (ref 1.0–3.6)
LYMPHS PCT: 8 %
MCH: 29.9 pg (ref 26.0–34.0)
MCHC: 34.5 g/dL (ref 32.0–36.0)
MCV: 86.8 fL (ref 80.0–100.0)
MONO ABS: 0.7 10*3/uL (ref 0.2–0.9)
MONOS PCT: 11 %
Neutro Abs: 4.6 10*3/uL (ref 1.4–6.5)
Neutrophils Relative %: 76 %
Platelets: 178 10*3/uL (ref 150–440)
RBC: 4.07 MIL/uL (ref 3.80–5.20)
RDW: 15.3 % — ABNORMAL HIGH (ref 11.5–14.5)
WBC: 6 10*3/uL (ref 3.6–11.0)

## 2016-09-18 LAB — BASIC METABOLIC PANEL
Anion gap: 9 (ref 5–15)
BUN: 24 mg/dL — AB (ref 6–20)
CHLORIDE: 99 mmol/L — AB (ref 101–111)
CO2: 30 mmol/L (ref 22–32)
Calcium: 9.4 mg/dL (ref 8.9–10.3)
Creatinine, Ser: 1 mg/dL (ref 0.44–1.00)
GFR calc Af Amer: >60 mL/min (ref >60)
GFR calc non Af Amer: 54 mL/min — ABNORMAL LOW (ref >60)
GLUCOSE: 91 mg/dL (ref 65–99)
POTASSIUM: 3.4 mmol/L — AB (ref 3.5–5.1)
Sodium: 138 mmol/L (ref 135–145)

## 2016-09-18 MED ORDER — OCTREOTIDE ACETATE 20 MG IM KIT
20.0000 mg | PACK | Freq: Once | INTRAMUSCULAR | Status: AC
Start: 2016-09-18 — End: 2016-09-18
  Administered 2016-09-18: 20 mg via INTRAMUSCULAR
  Filled 2016-09-18: qty 1

## 2016-09-19 ENCOUNTER — Ambulatory Visit: Payer: Medicare Other | Attending: Family | Admitting: Family

## 2016-09-19 ENCOUNTER — Encounter: Payer: Self-pay | Admitting: Family

## 2016-09-19 VITALS — BP 121/55 | HR 62 | Resp 18 | Ht 65.0 in | Wt 206.2 lb

## 2016-09-19 DIAGNOSIS — Z79899 Other long term (current) drug therapy: Secondary | ICD-10-CM | POA: Diagnosis not present

## 2016-09-19 DIAGNOSIS — D490 Neoplasm of unspecified behavior of digestive system: Secondary | ICD-10-CM | POA: Insufficient documentation

## 2016-09-19 DIAGNOSIS — Z86718 Personal history of other venous thrombosis and embolism: Secondary | ICD-10-CM | POA: Diagnosis not present

## 2016-09-19 DIAGNOSIS — Z7982 Long term (current) use of aspirin: Secondary | ICD-10-CM | POA: Diagnosis not present

## 2016-09-19 DIAGNOSIS — I10 Essential (primary) hypertension: Secondary | ICD-10-CM

## 2016-09-19 DIAGNOSIS — I5032 Chronic diastolic (congestive) heart failure: Secondary | ICD-10-CM | POA: Diagnosis not present

## 2016-09-19 DIAGNOSIS — I130 Hypertensive heart and chronic kidney disease with heart failure and stage 1 through stage 4 chronic kidney disease, or unspecified chronic kidney disease: Secondary | ICD-10-CM | POA: Diagnosis not present

## 2016-09-19 DIAGNOSIS — E039 Hypothyroidism, unspecified: Secondary | ICD-10-CM | POA: Insufficient documentation

## 2016-09-19 DIAGNOSIS — R5383 Other fatigue: Secondary | ICD-10-CM | POA: Diagnosis present

## 2016-09-19 DIAGNOSIS — Z9049 Acquired absence of other specified parts of digestive tract: Secondary | ICD-10-CM | POA: Insufficient documentation

## 2016-09-19 DIAGNOSIS — N189 Chronic kidney disease, unspecified: Secondary | ICD-10-CM | POA: Diagnosis not present

## 2016-09-19 DIAGNOSIS — C7A019 Malignant carcinoid tumor of the small intestine, unspecified portion: Secondary | ICD-10-CM

## 2016-09-19 DIAGNOSIS — K219 Gastro-esophageal reflux disease without esophagitis: Secondary | ICD-10-CM | POA: Insufficient documentation

## 2016-09-19 DIAGNOSIS — Z853 Personal history of malignant neoplasm of breast: Secondary | ICD-10-CM | POA: Diagnosis not present

## 2016-09-19 MED ORDER — POTASSIUM CHLORIDE CRYS ER 20 MEQ PO TBCR: 40.0000 meq | EXTENDED_RELEASE_TABLET | Freq: Two times a day (BID) | ORAL | 5 refills | Status: DC

## 2016-09-19 NOTE — Patient Instructions (Signed)
Continue weighing daily and call for an overnight weight gain of > 2 pounds or a weekly weight gain of >5 pounds. 

## 2016-09-19 NOTE — Progress Notes (Signed)
Patient ID: Emma [name], female    DOB: 27-Nov-1940, 76 y.o.   MRN: [id number]  HPI  Emma [name] is a 76 y/o female with a history of hypothyroidism, DVT, HTN, HOH, kidney stones, hiatal hernia, GERD, CKD, left breast cancer, sepsis and chronic heart failure.  Last echo was done 12/25/15 which showed an EF of 50-55% without any valvular regurgitation.   Was in the ED on 02/07/16 for acute cystitis and was treated and released. She was last admitted on 01/25/16 due to multidrug resistant infection in urinary system. Developed acute HF while hospitalized and was treated with IV diuretics with resolution of her symptoms. ID consult was obtained with recommendation of 14 days of IV antibiotics at discharge via a PICC line. Previous admission was on 12/24/15 with acute on chronic heart failure. Treated with IV diuretics and BP was treated.   She presents today for her follow-up visit with a chief complaint of mild fatigue with little exertion. She describes this as chronic in nature having been present for several years with varying levels of severity. She has associated shortness of breath, edema, palpitations and gradual weight gain along with this. She denies any chest pain or dizziness. Has been taking her furosemide at bedtime because she thought if she took it at the same time as her potassium that it would wash all her potassium out.   Past Medical History:  Diagnosis Date  . Aromatase inhibitor use   . Breast cancer ([place]) 2014   left breast cancer/radiation  . Breast cancer, left breast ([place]) 06/19/2014  . CHF (congestive heart failure) ([facility])    recent sepsis  . Chronic kidney disease   . Dizziness   . Dyspnea    recently while admitted  . GERD (gastroesophageal reflux disease)   . Hearing loss   . History of hiatal hernia   . History of kidney stones   . HOH (hard of hearing)    severe  . Hypertension   . Hypothyroidism   . Leg DVT (deep venous thromboembolism), acute ([place])  07/2015   left leg after fracture  . 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 Pain    chest pain while admitted  . Thyroid disease    Past Surgical History:  Procedure Laterality Date  . ABDOMINAL HYSTERECTOMY     partial  . BREAST EXCISIONAL BIOPSY Left 12/2012   +  . BREAST LUMPECTOMY Left 2014  . BREAST LUMPECTOMY WITH SENTINEL LYMPH NODE BIOPSY Left 2014  . CHOLECYSTECTOMY    . CYSTOSCOPY W/ URETERAL STENT PLACEMENT Right 01/07/2016   Procedure: CYSTOSCOPY WITH STENT REPLACEMENT;  Surgeon: Hollice Espy, MD;  Location: [facility] OR[facility];  Service: Urology;  Laterality: Right;  . CYSTOSCOPY WITH RETROGRADE PYELOGRAM, URETEROSCOPY AND STENT PLACEMENT Right 12/19/2015   Procedure: CYSTOSCOPY WITH RETROGRADE PYELOGRAM, URETEROSCOPY AND STENT PLACEMENT;  Surgeon: Ardis Hughs, MD;  Location: [facility];  Service: Urology;  Laterality: Right;  . URETEROSCOPY WITH HOLMIUM LASER LITHOTRIPSY Right 01/07/2016   Procedure: URETEROSCOPY WITH HOLMIUM LASER LITHOTRIPSY;  Surgeon: Hollice Espy, MD;  Location: [facility] OR[facility];  Service: Urology;  Laterality: Right;   Family History  Problem Relation Age of Onset  . Breast cancer Mother 43  . Breast cancer Maternal Aunt 68  . Prostate cancer Father    Social History  Substance Use Topics  . Smoking status: Never Smoker  . Smokeless tobacco: Never Used  . Alcohol use No   Allergies  Allergen Reactions  . Sulfa Antibiotics  Other reaction(s): Kidney Disorder Other reaction(s): Kidney Disorder Other reaction(s): Kidney Disorder   Prior to Admission medications   Medication Sig Start Date End Date Taking? Authorizing Provider  acetaminophen (TYLENOL) 500 MG tablet Take 500 mg by mouth every 6 (six) hours as needed (for pain.).   Yes [provider]  anastrozole (ARIMIDEX) 1 MG tablet TAKE 1 TABLET BY MOUTH EVERY DAY 08/20/16  Yes [name], MD  aspirin EC 81 MG EC tablet Take 1 tablet (81 mg total) by mouth daily. 12/27/15  Yes Demetrios Loll, MD  Camphor-Menthol-Capsicum (TIGER BALM PAIN RELIEVING) 80-24-16 MG [facility] 1 patch onto the skin daily as needed (for knee pain.).   Yes [provider]  ergocalciferol (VITAMIN D2) 50000 units capsule Take 1 capsule (50,000 Units total) by mouth once a week. 03/25/16  Yes [name], MD  furosemide (LASIX) 40 MG tablet Take 40 mg by mouth daily.   Yes [provider]  labetalol (NORMODYNE) 200 MG tablet TAKE 1 TABLET (200MG  TOTAL) BY MOUTH 2 (TWO) TIMES DAILY. 08/09/14  Yes [provider]  levothyroxine (SYNTHROID) 88 MCG tablet Take 88 mcg by mouth daily before breakfast.  06/20/14  Yes [provider]  losartan-hydrochlorothiazide (HYZAAR) 100-25 MG tablet Take 1 tablet by mouth daily.   Yes [provider]  octreotide (SANDOSTATIN LAR) 30 MG injection Inject 30 mg into the muscle every 28 (twenty-eight) days.    Yes [provider]  potassium chloride SA (K-DUR,KLOR-CON) 20 MEQ tablet Take 2 tablets (40 mEq total) by mouth 2 (two) times daily. 09/19/16  Yes [name], FNP    Review of Systems  Constitutional: Positive for fatigue. Negative for appetite change.  HENT: Positive for hearing loss. Negative for congestion, postnasal drip and sore throat.   Eyes: Negative.   Respiratory: Positive for shortness of breath (sometimes). Negative for cough and chest tightness.   Cardiovascular: Positive for palpitations (sometimes) and leg swelling (hasn't taken fluid pill yet today). Negative for chest pain.  Gastrointestinal: Negative for abdominal distention and abdominal pain.  Endocrine: Negative.   Genitourinary: Negative.   Musculoskeletal: Positive for arthralgias (both knees). Negative for back pain.  Skin: Negative.   Allergic/Immunologic: Negative.   Neurological: Negative for dizziness and light-headedness.  Hematological: Negative for adenopathy. Does not bruise/bleed easily.  Psychiatric/Behavioral:  Positive for sleep disturbance (sleeping on 1 pillow; sometimes doesn't sleep well). Negative for dysphoric mood and suicidal ideas. The patient is not nervous/anxious.    Vitals:   09/19/16 1309  BP: (!) 121/55  Pulse: 62  Resp: 18  SpO2: 96%  Weight: 206 lb 4 oz (93.6 kg)  Height: 5\' 5"  (1.651 m)   Wt Readings from Last 3 Encounters:  09/19/16 206 lb 4 oz (93.6 kg)  08/21/16 207 lb (93.9 kg)  06/19/16 210 lb (95.3 kg)    Lab Results  Component Value Date   CREATININE 1.00 09/18/2016   CREATININE 0.96 08/21/2016   CREATININE 0.96 06/19/2016   Physical Exam  Constitutional: She is oriented to person, place, and time. She appears well-developed and well-nourished.  HENT:  Head: Normocephalic and atraumatic.  Right Ear: Decreased hearing is noted.  Left Ear: Decreased hearing is noted.  Neck: Normal range of motion. Neck supple. No JVD present.  Cardiovascular: Normal rate and regular rhythm.   Pulmonary/Chest: Effort normal. She has no wheezes. She has no rales.  Abdominal: Soft. She exhibits no distension. There is no tenderness.  Musculoskeletal: She exhibits  edema (1+ pitting edema in bilateral lower legs). She exhibits no tenderness.  Neurological: She is alert and oriented to person, place, and time.  Skin: Skin is warm and dry.  Psychiatric: She has a normal mood and affect. Her behavior is normal. Thought content normal.  Nursing note and vitals reviewed.  Assessment & Plan:  1: Chronic heart failure with preserved ejection fraction- - NYHA class III - mildly fluid overloaded today but she hasn't taken her diuretic yet today. Will take it when she returns home - already weighing daily. Instructed to call for an overnight weight gain of >2 pounds or a weekly weight gain of >5 pounds. By our scale, she's gained 6 pounds since she was last here - not adding salt to her food. Currently living with her daughter & son-in-law who is doing the shopping/cooking. Does admit  to eating saltier foods at times - instructed her to take her furosemide earlier in the day instead of at bedtime so that she isn't constantly getting up and down at night to urinate - saw her cardiologist ([place]) 09/10/16 - encouraged increased activity  2: HTN- - BP looks good today - last saw her PCP Kary Kos) on 05/26/16 and thinks she has an appointment coming up with him - reviewed BMP drawn on 09/18/16; potassium 3.4 and GFR 54 - advised patient to increase her potassium to 47meq BID (was taking 60meq TID)  3: Carcinoid tumor of small intestine- - being followed closely at the cancer center  Medication list was reviewed.   Return in 3 months or sooner for any questions/problems before then

## 2016-09-23 ENCOUNTER — Other Ambulatory Visit: Payer: Self-pay | Admitting: Internal Medicine

## 2016-09-23 DIAGNOSIS — Z79811 Long term (current) use of aromatase inhibitors: Secondary | ICD-10-CM

## 2016-09-29 ENCOUNTER — Other Ambulatory Visit: Payer: Self-pay | Admitting: Internal Medicine

## 2016-09-29 DIAGNOSIS — Z79811 Long term (current) use of aromatase inhibitors: Secondary | ICD-10-CM

## 2016-09-29 DIAGNOSIS — C7A019 Malignant carcinoid tumor of the small intestine, unspecified portion: Secondary | ICD-10-CM

## 2016-10-16 ENCOUNTER — Inpatient Hospital Stay: Payer: Medicare Other

## 2016-10-16 ENCOUNTER — Inpatient Hospital Stay (HOSPITAL_BASED_OUTPATIENT_CLINIC_OR_DEPARTMENT_OTHER): Payer: Medicare Other | Admitting: Internal Medicine

## 2016-10-16 ENCOUNTER — Inpatient Hospital Stay: Payer: Medicare Other | Attending: Internal Medicine

## 2016-10-16 VITALS — BP 156/78 | HR 59 | Temp 97.4°F | Resp 16 | Wt 204.0 lb

## 2016-10-16 DIAGNOSIS — K760 Fatty (change of) liver, not elsewhere classified: Secondary | ICD-10-CM

## 2016-10-16 DIAGNOSIS — E876 Hypokalemia: Secondary | ICD-10-CM | POA: Insufficient documentation

## 2016-10-16 DIAGNOSIS — Z79899 Other long term (current) drug therapy: Secondary | ICD-10-CM | POA: Diagnosis not present

## 2016-10-16 DIAGNOSIS — K219 Gastro-esophageal reflux disease without esophagitis: Secondary | ICD-10-CM | POA: Diagnosis not present

## 2016-10-16 DIAGNOSIS — Z7982 Long term (current) use of aspirin: Secondary | ICD-10-CM | POA: Diagnosis not present

## 2016-10-16 DIAGNOSIS — N189 Chronic kidney disease, unspecified: Secondary | ICD-10-CM | POA: Diagnosis not present

## 2016-10-16 DIAGNOSIS — I130 Hypertensive heart and chronic kidney disease with heart failure and stage 1 through stage 4 chronic kidney disease, or unspecified chronic kidney disease: Secondary | ICD-10-CM | POA: Insufficient documentation

## 2016-10-16 DIAGNOSIS — C7A019 Malignant carcinoid tumor of the small intestine, unspecified portion: Secondary | ICD-10-CM

## 2016-10-16 DIAGNOSIS — Z79811 Long term (current) use of aromatase inhibitors: Secondary | ICD-10-CM | POA: Insufficient documentation

## 2016-10-16 DIAGNOSIS — Z923 Personal history of irradiation: Secondary | ICD-10-CM | POA: Diagnosis not present

## 2016-10-16 DIAGNOSIS — D509 Iron deficiency anemia, unspecified: Secondary | ICD-10-CM

## 2016-10-16 DIAGNOSIS — Z86718 Personal history of other venous thrombosis and embolism: Secondary | ICD-10-CM | POA: Insufficient documentation

## 2016-10-16 DIAGNOSIS — Z170 Estrogen receptor positive status [ER+]: Secondary | ICD-10-CM | POA: Diagnosis not present

## 2016-10-16 DIAGNOSIS — I700 Atherosclerosis of aorta: Secondary | ICD-10-CM | POA: Insufficient documentation

## 2016-10-16 DIAGNOSIS — E039 Hypothyroidism, unspecified: Secondary | ICD-10-CM | POA: Insufficient documentation

## 2016-10-16 DIAGNOSIS — I509 Heart failure, unspecified: Secondary | ICD-10-CM | POA: Diagnosis not present

## 2016-10-16 DIAGNOSIS — K6389 Other specified diseases of intestine: Secondary | ICD-10-CM

## 2016-10-16 DIAGNOSIS — C787 Secondary malignant neoplasm of liver and intrahepatic bile duct: Secondary | ICD-10-CM

## 2016-10-16 DIAGNOSIS — C50912 Malignant neoplasm of unspecified site of left female breast: Secondary | ICD-10-CM | POA: Diagnosis not present

## 2016-10-16 DIAGNOSIS — K449 Diaphragmatic hernia without obstruction or gangrene: Secondary | ICD-10-CM | POA: Insufficient documentation

## 2016-10-16 DIAGNOSIS — Z87442 Personal history of urinary calculi: Secondary | ICD-10-CM | POA: Insufficient documentation

## 2016-10-16 LAB — CBC WITH DIFFERENTIAL/PLATELET
BASOS PCT: 1 %
Basophils Absolute: 0.1 10*3/uL (ref 0–0.1)
EOS ABS: 0.4 10*3/uL (ref 0–0.7)
EOS PCT: 8 %
HCT: 36.1 % (ref 35.0–47.0)
Hemoglobin: 12.5 g/dL (ref 12.0–16.0)
LYMPHS ABS: 0.5 10*3/uL — AB (ref 1.0–3.6)
Lymphocytes Relative: 9 %
MCH: 30.4 pg (ref 26.0–34.0)
MCHC: 34.7 g/dL (ref 32.0–36.0)
MCV: 87.6 fL (ref 80.0–100.0)
Monocytes Absolute: 0.8 10*3/uL (ref 0.2–0.9)
Monocytes Relative: 14 %
NEUTROS PCT: 68 %
Neutro Abs: 4 10*3/uL (ref 1.4–6.5)
PLATELETS: 173 10*3/uL (ref 150–440)
RBC: 4.12 MIL/uL (ref 3.80–5.20)
RDW: 14.6 % — ABNORMAL HIGH (ref 11.5–14.5)
WBC: 5.8 10*3/uL (ref 3.6–11.0)

## 2016-10-16 LAB — COMPREHENSIVE METABOLIC PANEL
ALBUMIN: 4 g/dL (ref 3.5–5.0)
ALT: 16 U/L (ref 14–54)
ANION GAP: 9 (ref 5–15)
AST: 28 U/L (ref 15–41)
Alkaline Phosphatase: 85 U/L (ref 38–126)
BUN: 17 mg/dL (ref 6–20)
CALCIUM: 9.3 mg/dL (ref 8.9–10.3)
CO2: 26 mmol/L (ref 22–32)
Chloride: 102 mmol/L (ref 101–111)
Creatinine, Ser: 0.93 mg/dL (ref 0.44–1.00)
GFR calc non Af Amer: 59 mL/min — ABNORMAL LOW (ref >60)
Glucose, Bld: 123 mg/dL — ABNORMAL HIGH (ref 65–99)
Potassium: 3.4 mmol/L — ABNORMAL LOW (ref 3.5–5.1)
SODIUM: 137 mmol/L (ref 135–145)
TOTAL PROTEIN: 7 g/dL (ref 6.5–8.1)
Total Bilirubin: 1 mg/dL (ref 0.3–1.2)

## 2016-10-16 MED ORDER — OCTREOTIDE ACETATE 20 MG IM KIT
20.0000 mg | PACK | Freq: Once | INTRAMUSCULAR | Status: AC
  Administered 2016-10-16: 20 mg via INTRAMUSCULAR
  Filled 2016-10-16: qty 1

## 2016-10-16 NOTE — Progress Notes (Signed)
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 No family.   A very pleasant 76 year old female patient with above history of breast cancer and also mesenteric mass/small bowel carcinoid is here for follow-up/ Proceed with Sandostatin.  Patient states her episode of "blood in the toilet bowl" approximately 5 days ago. It was dark red. She was not sure if this was a rectal bleeding or vaginal bleeding or urethral bleeding. She had mild dizziness at that episode. But no syncope. This has not happened since then. Patient denies any diarrhea. Denies any nausea vomiting. Denies abdominal bloating. No  wheezing. Chronic mild fatigue. Continues to have  chronic arthritic pain in the knees. Not any worse.   REVIEW OF SYSTEMS:  A complete 10 point review of system is done which is negative except mentioned above/history of present illness.   PAST MEDICAL HISTORY :  Past Medical History:  Diagnosis Date  . Aromatase inhibitor use   . Breast cancer ([place]) 2014   left breast cancer/radiation  . Breast cancer, left breast ([place]) 06/19/2014  . CHF (congestive heart failure) (HCC)    recent sepsis  . Chronic kidney disease   . Dizziness   . Dyspnea    recently while admitted  . GERD (gastroesophageal reflux disease)   . Hearing loss   . History of hiatal hernia   . History of kidney stones   . HOH (hard of hearing)    severe  . Hypertension   . Hypothyroidism   . Leg DVT (deep venous thromboembolism), acute ([place]) 07/2015   left leg after fracture  . 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 Pain    chest pain while admitted  . Thyroid disease     PAST SURGICAL HISTORY :   Past Surgical History:  Procedure Laterality Date  . ABDOMINAL HYSTERECTOMY     partial  . BREAST EXCISIONAL BIOPSY Left 12/2012   +  . BREAST LUMPECTOMY Left 2014  . BREAST LUMPECTOMY WITH SENTINEL LYMPH NODE BIOPSY Left 2014  . CHOLECYSTECTOMY    . CYSTOSCOPY W/ URETERAL STENT PLACEMENT Right 01/07/2016   Procedure: CYSTOSCOPY WITH STENT REPLACEMENT;  Surgeon: Hollice Espy, MD;  Location: [facility] ORS;  Service: Urology;  Laterality:  Right;  Marland Kitchen CYSTOSCOPY WITH RETROGRADE PYELOGRAM, URETEROSCOPY AND STENT PLACEMENT Right 12/19/2015   Procedure: CYSTOSCOPY WITH RETROGRADE PYELOGRAM, URETEROSCOPY AND STENT PLACEMENT;  Surgeon: Ardis Hughs, MD;  Location: [facility] ORS;  Service: Urology;  Laterality: Right;  . URETEROSCOPY WITH HOLMIUM LASER LITHOTRIPSY Right 01/07/2016   Procedure: URETEROSCOPY WITH HOLMIUM LASER LITHOTRIPSY;  Surgeon: Hollice Espy, MD;  Location: [facility] ORS;  Service: Urology;  Laterality: Right;     FAMILY HISTORY :   Family History  Problem Relation Age of Onset  . Breast cancer Mother 21  . Breast cancer Maternal Aunt 68  . Prostate cancer Father     SOCIAL HISTORY:   Social History  Substance Use Topics  . Smoking status: Never Smoker  . Smokeless tobacco: Never Used  . Alcohol use No    ALLERGIES:  is allergic to sulfa antibiotics.  MEDICATIONS:  Current Outpatient Prescriptions  Medication Sig Dispense Refill  . acetaminophen (TYLENOL) 500 MG tablet Take 500 mg by mouth every 6 (six) hours as needed (for pain.).    Marland Kitchen anastrozole (ARIMIDEX) 1 MG tablet TAKE 1 TABLET BY MOUTH EVERY DAY 30 tablet 0  . aspirin EC 81 MG EC tablet Take 1 tablet (81 mg total) by mouth daily. 30 tablet 2  . Camphor-Menthol-Capsicum (TIGER BALM PAIN RELIEVING) 80-24-16 MG PTCH Place 1 patch onto the skin daily as needed (for knee pain.).    Marland Kitchen furosemide (LASIX) 40 MG tablet Take 40 mg by mouth daily.    Marland Kitchen labetalol (NORMODYNE) 200 MG tablet TAKE 1 TABLET (200MG TOTAL) BY MOUTH 2 (TWO) TIMES DAILY.    Marland Kitchen levothyroxine (SYNTHROID) 88 MCG tablet Take 88 mcg by mouth daily before breakfast.     . losartan-hydrochlorothiazide (HYZAAR) 100-25 MG tablet Take 1 tablet by mouth daily.    Marland Kitchen octreotide (SANDOSTATIN LAR) 30 MG injection Inject 30 mg into the muscle every 28 (twenty-eight) days.     . potassium chloride SA (K-DUR,KLOR-CON) 20 MEQ tablet Take 2 tablets (40 mEq total) by mouth 2 (two) times daily. 120 tablet 5  . Vitamin D, Ergocalciferol, (DRISDOL) 50000 units CAPS capsule TAKE 1 CAPSULE (50,000 UNITS TOTAL) BY MOUTH ONCE A WEEK. 24 capsule 1   No current facility-administered medications for this visit.     PHYSICAL EXAMINATION: ECOG PERFORMANCE STATUS: 0 - Asymptomatic  BP (!) 156/78 (BP Location: Left Arm, Patient Position: Sitting)   Pulse (!) 59   Temp (!) 97.4 F (36.3 C) (Tympanic)   Resp 16   Wt 204 lb (92.5 kg)   BMI 33.95 kg/m   Filed Weights   10/16/16 1112   Weight: 204 lb (92.5 kg)   GENERAL: Well-nourished well-developed; Alert, no distress and comfortable.hard of hearing.She is in a wheelchair. She is alone. EYES: no pallor or icterus OROPHARYNX: no thrush or ulceration; good dentition  NECK: supple, no masses felt LYMPH: no palpable lymphadenopathy in the cervical, axillary or inguinal regions LUNGS: clear to auscultation and No wheeze or crackles HEART/CVS: regular rate & rhythm and no murmurs; No lower extremity edema; ABDOMEN:abdomen soft, non-tender and normal bowel sounds Musculoskeletal:no cyanosis of digits and no clubbing  PSYCH: alert & oriented x 3 with fluent speech NEURO: no focal motor/sensory deficits SKIN: no rashes or significant lesions  LABORATORY DATA:  I have reviewed the data as listed    Component Value Date/Time   NA 137 10/16/2016 1054   NA 137 07/12/2013 0841   K 3.4 (L) 10/16/2016 1054   K 4.7  07/12/2013 0841   CL 102 10/16/2016 1054   CL 107 07/12/2013 0841   CO2 26 10/16/2016 1054   CO2 20 (L) 07/12/2013 0841   GLUCOSE 123 (H) 10/16/2016 1054   GLUCOSE 122 (H) 07/12/2013 0841   BUN 17 10/16/2016 1054   BUN 18 07/12/2013 0841   CREATININE 0.93 10/16/2016 1054   CREATININE 0.64 05/22/2014 1111   CALCIUM 9.3 10/16/2016 1054   CALCIUM 9.2 07/12/2013 0841   PROT 7.0 10/16/2016 1054   PROT 7.0 05/22/2014 1111   ALBUMIN 4.0 10/16/2016 1054   ALBUMIN 4.2 05/22/2014 1111   AST 28 10/16/2016 1054   AST 29 05/22/2014 1111   ALT 16 10/16/2016 1054   ALT 17 05/22/2014 1111   ALKPHOS 85 10/16/2016 1054   ALKPHOS 95 05/22/2014 1111   BILITOT 1.0 10/16/2016 1054   BILITOT 0.8 05/22/2014 1111   GFRNONAA 59 (L) 10/16/2016 1054   GFRNONAA >60 05/22/2014 1111   GFRAA >60 10/16/2016 1054   GFRAA >60 05/22/2014 1111    No results found for: SPEP, UPEP  Lab Results  Component Value Date   WBC 5.8 10/16/2016   NEUTROABS 4.0 10/16/2016   HGB 12.5 10/16/2016   HCT 36.1 10/16/2016   MCV 87.6  10/16/2016   PLT 173 10/16/2016      Chemistry      Component Value Date/Time   NA 137 10/16/2016 1054   NA 137 07/12/2013 0841   K 3.4 (L) 10/16/2016 1054   K 4.7 07/12/2013 0841   CL 102 10/16/2016 1054   CL 107 07/12/2013 0841   CO2 26 10/16/2016 1054   CO2 20 (L) 07/12/2013 0841   BUN 17 10/16/2016 1054   BUN 18 07/12/2013 0841   CREATININE 0.93 10/16/2016 1054   CREATININE 0.64 05/22/2014 1111      Component Value Date/Time   CALCIUM 9.3 10/16/2016 1054   CALCIUM 9.2 07/12/2013 0841   ALKPHOS 85 10/16/2016 1054   ALKPHOS 95 05/22/2014 1111   AST 28 10/16/2016 1054   AST 29 05/22/2014 1111   ALT 16 10/16/2016 1054   ALT 17 05/22/2014 1111   BILITOT 1.0 10/16/2016 1054   BILITOT 0.8 05/22/2014 1111       IMPRESSION: 1. Vague heterogeneous 5.0 x 3.3 cm mass within the right hepatic lobe, adjacent to the hepatic hilum, with new intrahepatic biliary ductal dilatation. Smaller 1.7 cm hypodense mass at the left hepatic lobe. Findings are concerning for malignancy, with local metastasis. Hepatocellular carcinoma or cholangiocarcinoma could have such an appearance. Would correlate with lab findings. Dynamic liver protocol MRI or CT is recommended for further evaluation. 2. Minimal right-sided hydronephrosis again noted. Stranding about the right ureter and right renal pelvis is concerning for mild right-sided ureteritis. Perinephric stranding is improved from the prior study. 3. Spiculated 7.1 cm retractile mass again noted at the central small bowel mesentery, relatively stable from 2017 and again concerning for a large carcinoid tumor. 4. Stable 5.4 cm right adnexal cystic focus may reflect a pedunculated fibroid or ovarian fibrothecoma. Uterine fibroid again seen. 5. Peripherally calcified 3.4 cm splenic cyst again noted. 6. Scattered aortic atherosclerosis.   Electronically Signed   By: Garald Balding M.D.   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 No clinical progression noted.   # Continue Octroetide shots monthly. Due for one today.  # Iron deficiency anemia/Hx of AV malformation- continue by mouth iron at this time.  # Episode of dark red in toilet bowl- versus others. Less likely renal or bladder/uterovaginal. However if this happens again recommend workup with Dr. Elliot/others.  # LEFT BREAST CA STAGE I; ER/PR positive HER-2/neu negative. May 2018- mammogram- wnl.  Currently on Arimidex.  # Severe hypokalemia potassium- improved potassium 3.4. Continue potassium supplementation.  # Follow-up with me in 2 months/labs; labs/ sandostain Shots monthly.   Cc; Dr.Paraschoes.        [name], MD 10/16/2016 2:10 PM

## 2016-10-16 NOTE — Progress Notes (Signed)
Patient is here today for a follow up.  Patient states that Wednesday 10/08/16 she noticed some slight abdominal pain, and distending, which continued until Saturday 10/11/16. Patient states that on Saturday 10/11/16, she had a lose bowel movement, and she states 'it looked like it was a whole gallon of dark red blood." Patient reports that after this bowel movement, all of her abdominal pain and distending went away, and she has not noticed any blood in her stool since then.

## 2016-10-16 NOTE — Assessment & Plan Note (Addendum)
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 No clinical progression noted.   # Continue Octroetide shots monthly. Due for one today.  # Iron deficiency anemia/Hx of AV malformation- continue by mouth iron at this time.  # Episode of dark red in toilet bowl- versus others. Less likely renal or bladder/uterovaginal. However if this happens again recommend workup with Dr. Elliot/others.  # LEFT BREAST CA STAGE I; ER/PR positive HER-2/neu negative. May 2018- mammogram- wnl.  Currently on Arimidex.  # Severe hypokalemia potassium- improved potassium 3.4. Continue potassium supplementation.  # Follow-up with me in 2 months/labs; labs/ sandostain Shots monthly.   Cc; Dr.Paraschoes.

## 2016-10-25 ENCOUNTER — Other Ambulatory Visit: Payer: Self-pay | Admitting: Internal Medicine

## 2016-10-25 DIAGNOSIS — Z79811 Long term (current) use of aromatase inhibitors: Secondary | ICD-10-CM

## 2016-11-14 ENCOUNTER — Inpatient Hospital Stay: Payer: Medicare Other

## 2016-11-14 ENCOUNTER — Other Ambulatory Visit: Payer: Self-pay

## 2016-11-14 ENCOUNTER — Inpatient Hospital Stay: Payer: Medicare Other | Attending: Internal Medicine

## 2016-11-14 DIAGNOSIS — Z7982 Long term (current) use of aspirin: Secondary | ICD-10-CM | POA: Diagnosis not present

## 2016-11-14 DIAGNOSIS — I509 Heart failure, unspecified: Secondary | ICD-10-CM | POA: Diagnosis not present

## 2016-11-14 DIAGNOSIS — C50912 Malignant neoplasm of unspecified site of left female breast: Secondary | ICD-10-CM | POA: Insufficient documentation

## 2016-11-14 DIAGNOSIS — N189 Chronic kidney disease, unspecified: Secondary | ICD-10-CM | POA: Diagnosis not present

## 2016-11-14 DIAGNOSIS — K449 Diaphragmatic hernia without obstruction or gangrene: Secondary | ICD-10-CM | POA: Insufficient documentation

## 2016-11-14 DIAGNOSIS — Z170 Estrogen receptor positive status [ER+]: Secondary | ICD-10-CM | POA: Insufficient documentation

## 2016-11-14 DIAGNOSIS — K219 Gastro-esophageal reflux disease without esophagitis: Secondary | ICD-10-CM | POA: Insufficient documentation

## 2016-11-14 DIAGNOSIS — C7A019 Malignant carcinoid tumor of the small intestine, unspecified portion: Secondary | ICD-10-CM

## 2016-11-14 DIAGNOSIS — Z87442 Personal history of urinary calculi: Secondary | ICD-10-CM | POA: Insufficient documentation

## 2016-11-14 DIAGNOSIS — C787 Secondary malignant neoplasm of liver and intrahepatic bile duct: Secondary | ICD-10-CM | POA: Insufficient documentation

## 2016-11-14 DIAGNOSIS — Z79899 Other long term (current) drug therapy: Secondary | ICD-10-CM | POA: Insufficient documentation

## 2016-11-14 DIAGNOSIS — E039 Hypothyroidism, unspecified: Secondary | ICD-10-CM | POA: Insufficient documentation

## 2016-11-14 DIAGNOSIS — E876 Hypokalemia: Secondary | ICD-10-CM | POA: Insufficient documentation

## 2016-11-14 DIAGNOSIS — Z79811 Long term (current) use of aromatase inhibitors: Secondary | ICD-10-CM | POA: Diagnosis not present

## 2016-11-14 DIAGNOSIS — I700 Atherosclerosis of aorta: Secondary | ICD-10-CM | POA: Insufficient documentation

## 2016-11-14 DIAGNOSIS — K760 Fatty (change of) liver, not elsewhere classified: Secondary | ICD-10-CM | POA: Insufficient documentation

## 2016-11-14 DIAGNOSIS — K6389 Other specified diseases of intestine: Secondary | ICD-10-CM

## 2016-11-14 DIAGNOSIS — D509 Iron deficiency anemia, unspecified: Secondary | ICD-10-CM | POA: Insufficient documentation

## 2016-11-14 DIAGNOSIS — Z86718 Personal history of other venous thrombosis and embolism: Secondary | ICD-10-CM | POA: Insufficient documentation

## 2016-11-14 DIAGNOSIS — I130 Hypertensive heart and chronic kidney disease with heart failure and stage 1 through stage 4 chronic kidney disease, or unspecified chronic kidney disease: Secondary | ICD-10-CM | POA: Diagnosis not present

## 2016-11-14 DIAGNOSIS — Z923 Personal history of irradiation: Secondary | ICD-10-CM | POA: Insufficient documentation

## 2016-11-14 LAB — CBC WITH DIFFERENTIAL/PLATELET
Basophils Absolute: 0.1 10*3/uL (ref 0–0.1)
Basophils Relative: 1 %
Eosinophils Absolute: 0.4 10*3/uL (ref 0–0.7)
Eosinophils Relative: 6 %
HEMATOCRIT: 37.4 % (ref 35.0–47.0)
HEMOGLOBIN: 12.6 g/dL (ref 12.0–16.0)
LYMPHS ABS: 0.5 10*3/uL — AB (ref 1.0–3.6)
LYMPHS PCT: 8 %
MCH: 30 pg (ref 26.0–34.0)
MCHC: 33.5 g/dL (ref 32.0–36.0)
MCV: 89.3 fL (ref 80.0–100.0)
MONO ABS: 0.8 10*3/uL (ref 0.2–0.9)
MONOS PCT: 12 %
NEUTROS ABS: 5 10*3/uL (ref 1.4–6.5)
NEUTROS PCT: 73 %
Platelets: 184 10*3/uL (ref 150–440)
RBC: 4.19 MIL/uL (ref 3.80–5.20)
RDW: 14.3 % (ref 11.5–14.5)
WBC: 6.8 10*3/uL (ref 3.6–11.0)

## 2016-11-14 LAB — BASIC METABOLIC PANEL
Anion gap: 12 (ref 5–15)
BUN: 19 mg/dL (ref 6–20)
CALCIUM: 9.1 mg/dL (ref 8.9–10.3)
CHLORIDE: 100 mmol/L — AB (ref 101–111)
CO2: 25 mmol/L (ref 22–32)
CREATININE: 0.81 mg/dL (ref 0.44–1.00)
GFR calc Af Amer: >60 mL/min (ref >60)
GFR calc non Af Amer: >60 mL/min (ref >60)
GLUCOSE: 116 mg/dL — AB (ref 65–99)
Potassium: 3.4 mmol/L — ABNORMAL LOW (ref 3.5–5.1)
Sodium: 137 mmol/L (ref 135–145)

## 2016-11-14 MED ORDER — OCTREOTIDE ACETATE 20 MG IM KIT
20.0000 mg | PACK | Freq: Once | INTRAMUSCULAR | Status: AC
  Administered 2016-11-14: 20 mg via INTRAMUSCULAR

## 2016-11-27 ENCOUNTER — Other Ambulatory Visit: Payer: Self-pay | Admitting: Internal Medicine

## 2016-11-27 DIAGNOSIS — Z79811 Long term (current) use of aromatase inhibitors: Secondary | ICD-10-CM

## 2016-12-01 ENCOUNTER — Encounter: Payer: Self-pay | Admitting: Radiation Oncology

## 2016-12-01 ENCOUNTER — Ambulatory Visit
Admission: RE | Admit: 2016-12-01 | Discharge: 2016-12-01 | Disposition: A | Discharge status: Home / Self Care | Payer: Medicare Other | Source: Ambulatory Visit | Attending: Radiation Oncology | Admitting: Radiation Oncology

## 2016-12-01 VITALS — BP 180/80 | HR 57 | Resp 18 | Wt 211.8 lb

## 2016-12-01 DIAGNOSIS — R1909 Other intra-abdominal and pelvic swelling, mass and lump: Secondary | ICD-10-CM | POA: Diagnosis not present

## 2016-12-01 DIAGNOSIS — Z79811 Long term (current) use of aromatase inhibitors: Secondary | ICD-10-CM | POA: Diagnosis not present

## 2016-12-01 DIAGNOSIS — K769 Liver disease, unspecified: Secondary | ICD-10-CM | POA: Diagnosis not present

## 2016-12-01 DIAGNOSIS — C50912 Malignant neoplasm of unspecified site of left female breast: Secondary | ICD-10-CM | POA: Insufficient documentation

## 2016-12-01 DIAGNOSIS — Z170 Estrogen receptor positive status [ER+]: Secondary | ICD-10-CM | POA: Diagnosis not present

## 2016-12-01 DIAGNOSIS — Z923 Personal history of irradiation: Secondary | ICD-10-CM | POA: Diagnosis not present

## 2016-12-01 NOTE — Progress Notes (Signed)
Radiation Oncology Follow up Note  Name: Emma [name]   Date:   12/01/2016 MRN:  [id number] DOB: 03-12-1940    This 76 y.o. female presents to the clinic today for 3/2 year follow-up status post whole breast radiation to her left breast for stage I invasive mammary carcinoma.  REFERRING PROVIDER: [name], MD  HPI: Patient is a 76 year old female now out 3 and half years having completed whole breast radiation to her left breast for a stage I invasive mammary carcinoma ER/PR positive. She is currently on arimadex tolerating that well.. She mammograms back in May which were BI-RADS 2 benign which I have reviewed. She is being worked up by medical oncology for a vague heterogeneous 5 cm mass in the right hepatic lobe. She also has a large 7 cm mass in her central small bowel mesentery concerning for carcinoid.  COMPLICATIONS OF TREATMENT: none  FOLLOW UP COMPLIANCE: keeps appointments   PHYSICAL EXAM:  BP (!) 180/80   Pulse (!) 57   Resp 18   Wt 211 lb 12 oz (96.1 kg)   BMI 35.24 kg/m  A well-developed female heart of hearing in NAD. Lungs are clear to A&P cardiac examination essentially unremarkable with regular rate and rhythm. No dominant mass or nodularity is noted in either breast in 2 positions examined. Incision is well-healed. No axillary or supraclavicular adenopathy is appreciated. Cosmetic result is excellent. Well-developed well-nourished patient in NAD. HEENT reveals PERLA, EOMI, discs not visualized.  Oral cavity is clear. No oral mucosal lesions are identified. Neck is clear without evidence of cervical or supraclavicular adenopathy. Lungs are clear to A&P. Cardiac examination is essentially unremarkable with regular rate and rhythm without murmur rub or thrill. Abdomen is benign with no organomegaly or masses noted. Motor sensory and DTR levels are equal and symmetric in the upper and lower extremities. Cranial nerves II through XII are grossly intact. Proprioception  is intact. No peripheral adenopathy or edema is identified. No motor or sensory levels are noted. Crude visual fields are within normal range.  RADIOLOGY RESULTS: Mammograms and CT scan reviewed  PLAN: At the present time for a breast standpoint she is doing well with no evidence of disease. I've asked to see her back in 1 year for follow-up and then will turn follow-up care over to medical oncology. They continued to be concerned about liver lesions as well as a possible mesenteric carcinoid. Patient continues on arimadex without side effect. She is a rescheduled for follow-up mammograms next May. Patient knows to call with any concerns.  I would like to take this opportunity to thank you for allowing me to participate in the care of your patient.Armstead Peaks., MD

## 2016-12-12 ENCOUNTER — Inpatient Hospital Stay: Payer: Medicare Other | Attending: Internal Medicine | Admitting: Internal Medicine

## 2016-12-12 ENCOUNTER — Inpatient Hospital Stay: Payer: Medicare Other

## 2016-12-12 VITALS — BP 159/81 | HR 58 | Temp 98.1°F | Resp 16 | Wt 205.0 lb

## 2016-12-12 DIAGNOSIS — Z7982 Long term (current) use of aspirin: Secondary | ICD-10-CM | POA: Insufficient documentation

## 2016-12-12 DIAGNOSIS — Z87442 Personal history of urinary calculi: Secondary | ICD-10-CM | POA: Insufficient documentation

## 2016-12-12 DIAGNOSIS — Z79811 Long term (current) use of aromatase inhibitors: Secondary | ICD-10-CM | POA: Insufficient documentation

## 2016-12-12 DIAGNOSIS — E039 Hypothyroidism, unspecified: Secondary | ICD-10-CM | POA: Diagnosis not present

## 2016-12-12 DIAGNOSIS — Z86718 Personal history of other venous thrombosis and embolism: Secondary | ICD-10-CM | POA: Diagnosis not present

## 2016-12-12 DIAGNOSIS — I509 Heart failure, unspecified: Secondary | ICD-10-CM | POA: Insufficient documentation

## 2016-12-12 DIAGNOSIS — Q2730 Arteriovenous malformation, site unspecified: Secondary | ICD-10-CM | POA: Insufficient documentation

## 2016-12-12 DIAGNOSIS — C7A019 Malignant carcinoid tumor of the small intestine, unspecified portion: Secondary | ICD-10-CM | POA: Insufficient documentation

## 2016-12-12 DIAGNOSIS — Z170 Estrogen receptor positive status [ER+]: Secondary | ICD-10-CM | POA: Insufficient documentation

## 2016-12-12 DIAGNOSIS — K6389 Other specified diseases of intestine: Secondary | ICD-10-CM

## 2016-12-12 DIAGNOSIS — E876 Hypokalemia: Secondary | ICD-10-CM | POA: Diagnosis not present

## 2016-12-12 DIAGNOSIS — Z923 Personal history of irradiation: Secondary | ICD-10-CM | POA: Insufficient documentation

## 2016-12-12 DIAGNOSIS — K449 Diaphragmatic hernia without obstruction or gangrene: Secondary | ICD-10-CM | POA: Insufficient documentation

## 2016-12-12 DIAGNOSIS — C787 Secondary malignant neoplasm of liver and intrahepatic bile duct: Secondary | ICD-10-CM | POA: Diagnosis not present

## 2016-12-12 DIAGNOSIS — C50912 Malignant neoplasm of unspecified site of left female breast: Secondary | ICD-10-CM | POA: Insufficient documentation

## 2016-12-12 DIAGNOSIS — D509 Iron deficiency anemia, unspecified: Secondary | ICD-10-CM | POA: Insufficient documentation

## 2016-12-12 DIAGNOSIS — Z79899 Other long term (current) drug therapy: Secondary | ICD-10-CM | POA: Diagnosis not present

## 2016-12-12 DIAGNOSIS — I130 Hypertensive heart and chronic kidney disease with heart failure and stage 1 through stage 4 chronic kidney disease, or unspecified chronic kidney disease: Secondary | ICD-10-CM | POA: Diagnosis not present

## 2016-12-12 DIAGNOSIS — E041 Nontoxic single thyroid nodule: Secondary | ICD-10-CM | POA: Diagnosis not present

## 2016-12-12 DIAGNOSIS — N189 Chronic kidney disease, unspecified: Secondary | ICD-10-CM | POA: Insufficient documentation

## 2016-12-12 DIAGNOSIS — K219 Gastro-esophageal reflux disease without esophagitis: Secondary | ICD-10-CM | POA: Insufficient documentation

## 2016-12-12 LAB — BASIC METABOLIC PANEL
ANION GAP: 9 (ref 5–15)
BUN: 20 mg/dL (ref 6–20)
CHLORIDE: 100 mmol/L — AB (ref 101–111)
CO2: 27 mmol/L (ref 22–32)
Calcium: 8.9 mg/dL (ref 8.9–10.3)
Creatinine, Ser: 0.8 mg/dL (ref 0.44–1.00)
GFR calc Af Amer: >60 mL/min (ref >60)
Glucose, Bld: 122 mg/dL — ABNORMAL HIGH (ref 65–99)
POTASSIUM: 3.2 mmol/L — AB (ref 3.5–5.1)
SODIUM: 136 mmol/L (ref 135–145)

## 2016-12-12 LAB — CBC WITH DIFFERENTIAL/PLATELET
BASOS ABS: 0 10*3/uL (ref 0–0.1)
Basophils Relative: 1 %
EOS PCT: 5 %
Eosinophils Absolute: 0.3 10*3/uL (ref 0–0.7)
HEMATOCRIT: 36.7 % (ref 35.0–47.0)
HEMOGLOBIN: 12.2 g/dL (ref 12.0–16.0)
LYMPHS ABS: 0.6 10*3/uL — AB (ref 1.0–3.6)
LYMPHS PCT: 11 %
MCH: 29.6 pg (ref 26.0–34.0)
MCHC: 33.1 g/dL (ref 32.0–36.0)
MCV: 89.3 fL (ref 80.0–100.0)
Monocytes Absolute: 0.8 10*3/uL (ref 0.2–0.9)
Monocytes Relative: 15 %
NEUTROS ABS: 3.7 10*3/uL (ref 1.4–6.5)
NEUTROS PCT: 68 %
PLATELETS: 179 10*3/uL (ref 150–440)
RBC: 4.11 MIL/uL (ref 3.80–5.20)
RDW: 14.2 % (ref 11.5–14.5)
WBC: 5.4 10*3/uL (ref 3.6–11.0)

## 2016-12-12 MED ORDER — OCTREOTIDE ACETATE 20 MG IM KIT
20.0000 mg | PACK | Freq: Once | INTRAMUSCULAR | Status: AC
  Administered 2016-12-12: 20 mg via INTRAMUSCULAR
  Filled 2016-12-12: qty 1

## 2016-12-12 NOTE — Progress Notes (Signed)
[place] OFFICE PROGRESS NOTE  [name], [name] [name], [name] [name], [name] [name], [name]   SUMMARY OF ONCOLOGIC HISTORY:  Oncology History   # JUNE 2015-MESENTERIC MASS [~5-6cm] Presumed CARCINOID with mets to liver [AUG 2016-Octreoscan; Dr.Hurwitz; [facility]]; FEB 2016- START OCTREOTIDE LAR qM; Octreo scan- FEB 2017- STABLE; cont Octreotide'; OCT 5th OCTREO SCAN- STABLE; mesenteric mass present.   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 No family.   A very pleasant 76 year old female patient with above history of breast cancer and also mesenteric mass/small bowel carcinoid is here for follow-up/ Proceed with Sandostatin.  Denies any repeat episodes of bleeding. Patient denies any diarrhea. Denies any nausea vomiting. Denies abdominal bloating. No wheezing. Chronic mild fatigue. Continues to have  chronic arthritic pain in the knees. Not any worse.   REVIEW OF SYSTEMS:  A complete 10 point review of system is done which is negative except mentioned above/history of present illness.    PAST MEDICAL HISTORY :  Past Medical History:  Diagnosis Date  . Aromatase inhibitor use   . Breast cancer ([place]) 2014   left breast cancer/radiation  . Breast cancer, left breast ([place]) 06/19/2014  . CHF (congestive heart failure) (HCC)    recent sepsis  . Chronic kidney disease   . Dizziness   . Dyspnea    recently while admitted  . GERD (gastroesophageal reflux disease)   . Hearing loss   . History of hiatal hernia   . History of kidney stones   . HOH (hard of hearing)    severe  . Hypertension   . Hypothyroidism   . Leg DVT (deep venous thromboembolism), acute ([place]) 07/2015   left leg after fracture  . 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 Pain    chest pain while admitted  . Thyroid disease     PAST SURGICAL HISTORY :   Past Surgical History:  Procedure Laterality Date  . ABDOMINAL HYSTERECTOMY     partial  . BREAST EXCISIONAL BIOPSY Left 12/2012   +  . BREAST LUMPECTOMY Left 2014  . BREAST LUMPECTOMY WITH SENTINEL LYMPH NODE BIOPSY Left 2014  . CHOLECYSTECTOMY    . CYSTOSCOPY WITH RETROGRADE PYELOGRAM, URETEROSCOPY AND STENT PLACEMENT Right 12/19/2015   Performed by Ardis Hughs, MD at [facility] ORS  . CYSTOSCOPY WITH STENT REPLACEMENT Right 01/07/2016   Performed by Hollice Espy, MD at [facility] ORS  . URETEROSCOPY WITH HOLMIUM LASER LITHOTRIPSY Right 01/07/2016   Performed by Hollice Espy, MD at [facility] ORS    FAMILY HISTORY :   Family History  Problem Relation Age  of Onset  . Breast cancer Mother 4  . Breast cancer Maternal Aunt 68  . Prostate cancer Father     SOCIAL HISTORY:   Social History   Tobacco Use  . Smoking status: Never Smoker  . Smokeless tobacco: Never Used  Substance Use Topics  . Alcohol use: No    Alcohol/week: 0.0 oz  . Drug use: No    ALLERGIES:  is allergic to sulfa antibiotics.  MEDICATIONS:  Current Outpatient Medications  Medication Sig Dispense Refill  . acetaminophen (TYLENOL) 500 MG tablet Take 500 mg by mouth every 6  (six) hours as needed (for pain.).    Marland Kitchen anastrozole (ARIMIDEX) 1 MG tablet TAKE 1 TABLET BY MOUTH EVERY DAY 30 tablet 0  . aspirin EC 81 MG EC tablet Take 1 tablet (81 mg total) by mouth daily. 30 tablet 2  . Camphor-Menthol-Capsicum (TIGER BALM PAIN RELIEVING) 80-24-16 MG PTCH Place 1 patch onto the skin daily as needed (for knee pain.).    Marland Kitchen furosemide (LASIX) 40 MG tablet Take 40 mg by mouth daily.    Marland Kitchen labetalol (NORMODYNE) 200 MG tablet TAKE 1 TABLET (200MG TOTAL) BY MOUTH 2 (TWO) TIMES DAILY.    Marland Kitchen levothyroxine (SYNTHROID) 88 MCG tablet Take 88 mcg by mouth daily before breakfast.     . losartan-hydrochlorothiazide (HYZAAR) 100-25 MG tablet Take 1 tablet by mouth daily.    Marland Kitchen octreotide (SANDOSTATIN LAR) 30 MG injection Inject 30 mg into the muscle every 28 (twenty-eight) days.     . potassium chloride SA (K-DUR,KLOR-CON) 20 MEQ tablet Take 2 tablets (40 mEq total) by mouth 2 (two) times daily. 120 tablet 5  . Vitamin D, Ergocalciferol, (DRISDOL) 50000 units CAPS capsule TAKE 1 CAPSULE (50,000 UNITS TOTAL) BY MOUTH ONCE A WEEK. 24 capsule 1   No current facility-administered medications for this visit.     PHYSICAL EXAMINATION: ECOG PERFORMANCE STATUS: 0 - Asymptomatic  BP (!) 159/81 (BP Location: Left Arm, Patient Position: Sitting)   Pulse (!) 58   Temp 98.1 F (36.7 C) (Tympanic)   Resp 16   Wt 205 lb (93 kg)   BMI 34.11 kg/m   Filed Weights   12/12/16 1132  Weight: 205 lb (93 kg)   GENERAL: Well-nourished well-developed; Alert, no distress and comfortable.hard of hearing.She is in a wheelchair. She is alone. EYES: no pallor or icterus OROPHARYNX: no thrush or ulceration; good dentition  NECK: supple, no masses felt LYMPH: no palpable lymphadenopathy in the cervical, axillary or inguinal regions LUNGS: clear to auscultation and No wheeze or crackles HEART/CVS: regular rate & rhythm and no murmurs; No lower extremity edema; ABDOMEN:abdomen soft, non-tender and normal  bowel sounds Musculoskeletal:no cyanosis of digits and no clubbing  PSYCH: alert & oriented x 3 with fluent speech NEURO: no focal motor/sensory deficits SKIN: no rashes or significant lesions  LABORATORY DATA:  I have reviewed the data as listed    Component Value Date/Time   NA 136 12/12/2016 1101   NA 137 07/12/2013 0841   K 3.2 (L) 12/12/2016 1101   K 4.7 07/12/2013 0841   CL 100 (L) 12/12/2016 1101   CL 107 07/12/2013 0841   CO2 27 12/12/2016 1101   CO2 20 (L) 07/12/2013 0841   GLUCOSE 122 (H) 12/12/2016 1101   GLUCOSE 122 (H) 07/12/2013 0841   BUN 20 12/12/2016 1101   BUN 18 07/12/2013 0841   CREATININE 0.80 12/12/2016 1101   CREATININE 0.64 05/22/2014 1111   CALCIUM 8.9 12/12/2016 1101  CALCIUM 9.2 07/12/2013 0841   PROT 7.0 10/16/2016 1054   PROT 7.0 05/22/2014 1111   ALBUMIN 4.0 10/16/2016 1054   ALBUMIN 4.2 05/22/2014 1111   AST 28 10/16/2016 1054   AST 29 05/22/2014 1111   ALT 16 10/16/2016 1054   ALT 17 05/22/2014 1111   ALKPHOS 85 10/16/2016 1054   ALKPHOS 95 05/22/2014 1111   BILITOT 1.0 10/16/2016 1054   BILITOT 0.8 05/22/2014 1111   GFRNONAA >60 12/12/2016 1101   GFRNONAA >60 05/22/2014 1111   GFRAA >60 12/12/2016 1101   GFRAA >60 05/22/2014 1111    No results found for: SPEP, UPEP  Lab Results  Component Value Date   WBC 5.4 12/12/2016   NEUTROABS 3.7 12/12/2016   HGB 12.2 12/12/2016   HCT 36.7 12/12/2016   MCV 89.3 12/12/2016   PLT 179 12/12/2016      Chemistry      Component Value Date/Time   NA 136 12/12/2016 1101   NA 137 07/12/2013 0841   K 3.2 (L) 12/12/2016 1101   K 4.7 07/12/2013 0841   CL 100 (L) 12/12/2016 1101   CL 107 07/12/2013 0841   CO2 27 12/12/2016 1101   CO2 20 (L) 07/12/2013 0841   BUN 20 12/12/2016 1101   BUN 18 07/12/2013 0841   CREATININE 0.80 12/12/2016 1101   CREATININE 0.64 05/22/2014 1111      Component Value Date/Time   CALCIUM 8.9 12/12/2016 1101   CALCIUM 9.2 07/12/2013 0841   ALKPHOS 85  10/16/2016 1054   ALKPHOS 95 05/22/2014 1111   AST 28 10/16/2016 1054   AST 29 05/22/2014 1111   ALT 16 10/16/2016 1054   ALT 17 05/22/2014 1111   BILITOT 1.0 10/16/2016 1054   BILITOT 0.8 05/22/2014 1111        ASSESSMENT & PLAN:  Carcinoid tumor of small intestine, 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 No clinical progression noted.   # Continue Octroetide shots monthly. Due for one today.   # Iron deficiency anemia/Hx of AV malformation- continue by mouth iron at this time.  # LEFT BREAST CA STAGE I; ER/PR positive HER-2/neu negative. May 2018- mammogram- wnl.  Currently on Arimidex.  # Severe hypokalemia potassium- improved potassium 3.2- today; Continue potassium supplementation.  # Follow-up with me in 2 months/labs; labs/ sandostain Shots monthly; Hba 1c /chromogranin A; urine 5 HIAA      [name], MD 12/12/2016 8:42 PM

## 2016-12-12 NOTE — Assessment & Plan Note (Addendum)
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 No clinical progression noted.   # Continue Octroetide shots monthly. Due for one today.   # Iron deficiency anemia/Hx of AV malformation- continue by mouth iron at this time.  # LEFT BREAST CA STAGE I; ER/PR positive HER-2/neu negative. May 2018- mammogram- wnl.  Currently on Arimidex.  Clinically no evidence of recurrence.  # Severe hypokalemia potassium- improved potassium 3.2- today; Continue potassium supplementation.  # Follow-up with me in 2 months/labs; labs/ sandostain Shots monthly; Hba 1c /chromogranin A; urine 5 HIAA

## 2016-12-22 ENCOUNTER — Ambulatory Visit: Payer: Medicare Other | Admitting: Family

## 2016-12-24 ENCOUNTER — Other Ambulatory Visit: Payer: Self-pay | Admitting: Internal Medicine

## 2016-12-24 DIAGNOSIS — Z79811 Long term (current) use of aromatase inhibitors: Secondary | ICD-10-CM

## 2017-01-09 ENCOUNTER — Inpatient Hospital Stay: Payer: Medicare Other

## 2017-01-12 ENCOUNTER — Ambulatory Visit: Payer: Medicare Other | Admitting: Family

## 2017-01-13 ENCOUNTER — Inpatient Hospital Stay: Payer: Medicare Other | Attending: Internal Medicine

## 2017-01-13 DIAGNOSIS — Z87442 Personal history of urinary calculi: Secondary | ICD-10-CM | POA: Insufficient documentation

## 2017-01-13 DIAGNOSIS — K449 Diaphragmatic hernia without obstruction or gangrene: Secondary | ICD-10-CM | POA: Diagnosis not present

## 2017-01-13 DIAGNOSIS — Z86718 Personal history of other venous thrombosis and embolism: Secondary | ICD-10-CM | POA: Insufficient documentation

## 2017-01-13 DIAGNOSIS — Z79899 Other long term (current) drug therapy: Secondary | ICD-10-CM | POA: Insufficient documentation

## 2017-01-13 DIAGNOSIS — Z7982 Long term (current) use of aspirin: Secondary | ICD-10-CM | POA: Diagnosis not present

## 2017-01-13 DIAGNOSIS — N189 Chronic kidney disease, unspecified: Secondary | ICD-10-CM | POA: Insufficient documentation

## 2017-01-13 DIAGNOSIS — E876 Hypokalemia: Secondary | ICD-10-CM | POA: Insufficient documentation

## 2017-01-13 DIAGNOSIS — Z923 Personal history of irradiation: Secondary | ICD-10-CM | POA: Diagnosis not present

## 2017-01-13 DIAGNOSIS — E041 Nontoxic single thyroid nodule: Secondary | ICD-10-CM | POA: Diagnosis not present

## 2017-01-13 DIAGNOSIS — C50912 Malignant neoplasm of unspecified site of left female breast: Secondary | ICD-10-CM | POA: Insufficient documentation

## 2017-01-13 DIAGNOSIS — Q2730 Arteriovenous malformation, site unspecified: Secondary | ICD-10-CM | POA: Diagnosis not present

## 2017-01-13 DIAGNOSIS — I509 Heart failure, unspecified: Secondary | ICD-10-CM | POA: Insufficient documentation

## 2017-01-13 DIAGNOSIS — Z170 Estrogen receptor positive status [ER+]: Secondary | ICD-10-CM | POA: Diagnosis not present

## 2017-01-13 DIAGNOSIS — C787 Secondary malignant neoplasm of liver and intrahepatic bile duct: Secondary | ICD-10-CM | POA: Diagnosis not present

## 2017-01-13 DIAGNOSIS — I130 Hypertensive heart and chronic kidney disease with heart failure and stage 1 through stage 4 chronic kidney disease, or unspecified chronic kidney disease: Secondary | ICD-10-CM | POA: Insufficient documentation

## 2017-01-13 DIAGNOSIS — Z79811 Long term (current) use of aromatase inhibitors: Secondary | ICD-10-CM | POA: Diagnosis not present

## 2017-01-13 DIAGNOSIS — E039 Hypothyroidism, unspecified: Secondary | ICD-10-CM | POA: Diagnosis not present

## 2017-01-13 DIAGNOSIS — D509 Iron deficiency anemia, unspecified: Secondary | ICD-10-CM | POA: Diagnosis not present

## 2017-01-13 DIAGNOSIS — C7A019 Malignant carcinoid tumor of the small intestine, unspecified portion: Secondary | ICD-10-CM | POA: Diagnosis present

## 2017-01-13 DIAGNOSIS — K6389 Other specified diseases of intestine: Secondary | ICD-10-CM

## 2017-01-13 DIAGNOSIS — K219 Gastro-esophageal reflux disease without esophagitis: Secondary | ICD-10-CM | POA: Diagnosis not present

## 2017-01-13 MED ORDER — OCTREOTIDE ACETATE 20 MG IM KIT
20.0000 mg | PACK | Freq: Once | INTRAMUSCULAR | Status: AC
  Administered 2017-01-13: 20 mg via INTRAMUSCULAR
  Filled 2017-01-13: qty 1

## 2017-01-22 ENCOUNTER — Other Ambulatory Visit: Payer: Self-pay

## 2017-01-22 ENCOUNTER — Encounter: Payer: Self-pay | Admitting: Family

## 2017-01-22 ENCOUNTER — Ambulatory Visit: Payer: Medicare Other | Attending: Family | Admitting: Family

## 2017-01-22 VITALS — BP 174/63 | HR 54 | Resp 18 | Ht 65.0 in | Wt 211.4 lb

## 2017-01-22 DIAGNOSIS — Z79899 Other long term (current) drug therapy: Secondary | ICD-10-CM | POA: Insufficient documentation

## 2017-01-22 DIAGNOSIS — I5032 Chronic diastolic (congestive) heart failure: Secondary | ICD-10-CM

## 2017-01-22 DIAGNOSIS — Z853 Personal history of malignant neoplasm of breast: Secondary | ICD-10-CM | POA: Insufficient documentation

## 2017-01-22 DIAGNOSIS — Z9049 Acquired absence of other specified parts of digestive tract: Secondary | ICD-10-CM | POA: Diagnosis not present

## 2017-01-22 DIAGNOSIS — I509 Heart failure, unspecified: Secondary | ICD-10-CM | POA: Diagnosis not present

## 2017-01-22 DIAGNOSIS — I130 Hypertensive heart and chronic kidney disease with heart failure and stage 1 through stage 4 chronic kidney disease, or unspecified chronic kidney disease: Secondary | ICD-10-CM | POA: Diagnosis present

## 2017-01-22 DIAGNOSIS — D490 Neoplasm of unspecified behavior of digestive system: Secondary | ICD-10-CM | POA: Insufficient documentation

## 2017-01-22 DIAGNOSIS — Z7982 Long term (current) use of aspirin: Secondary | ICD-10-CM | POA: Diagnosis not present

## 2017-01-22 DIAGNOSIS — Z9889 Other specified postprocedural states: Secondary | ICD-10-CM | POA: Diagnosis not present

## 2017-01-22 DIAGNOSIS — I10 Essential (primary) hypertension: Secondary | ICD-10-CM

## 2017-01-22 DIAGNOSIS — C7A019 Malignant carcinoid tumor of the small intestine, unspecified portion: Secondary | ICD-10-CM

## 2017-01-22 DIAGNOSIS — Z90710 Acquired absence of both cervix and uterus: Secondary | ICD-10-CM | POA: Diagnosis not present

## 2017-01-22 DIAGNOSIS — Z803 Family history of malignant neoplasm of breast: Secondary | ICD-10-CM | POA: Diagnosis not present

## 2017-01-22 NOTE — Patient Instructions (Signed)
Continue weighing daily and call for an overnight weight gain of > 2 pounds or a weekly weight gain of >5 pounds. 

## 2017-01-22 NOTE — Progress Notes (Signed)
Patient ID: Emma [name], female    DOB: [birth year], 76 y.o.   MRN: [id number]  HPI  Emma [name] is a 76 y/o female with a history of hypothyroidism, DVT, HTN, HOH, kidney stones, hiatal hernia, GERD, CKD, left breast cancer, sepsis and chronic heart failure.  Last echo was done 12/25/15 which showed an EF of 50-55% without any valvular regurgitation.   Has not been admitted or been in the ED in the last 6 months.    She presents today for her follow-up visit with a chief complaint of minimal shortness of breath upon moderate exertion. She describes this as chronic in nature having been present for several years with varying levels of severity. She has associated fatigue, edema, palpitations, dizziness, difficulty sleeping and gradual weight gain over the holidays. She denies any chest pain.   Past Medical History:  Diagnosis Date  . Aromatase inhibitor use   . Breast cancer ([place]) 2014   left breast cancer/radiation  . Breast cancer, left breast ([place]) 06/19/2014  . CHF (congestive heart failure) (HCC)    recent sepsis  . Chronic kidney disease   . Dizziness   . Dyspnea    recently while admitted  . GERD (gastroesophageal reflux disease)   . Hearing loss   . History of hiatal hernia   . History of kidney stones   . HOH (hard of hearing)    severe  . Hypertension   . Hypothyroidism   . Leg DVT (deep venous thromboembolism), acute ([place]) 07/2015   left leg after fracture  . 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 Pain    chest pain while admitted  . Thyroid disease    Past Surgical History:  Procedure Laterality Date  . ABDOMINAL HYSTERECTOMY     partial  . BREAST EXCISIONAL BIOPSY Left 12/2012   +  . BREAST LUMPECTOMY Left 2014  . BREAST LUMPECTOMY WITH SENTINEL LYMPH NODE BIOPSY Left 2014  . CHOLECYSTECTOMY    . CYSTOSCOPY W/ URETERAL STENT PLACEMENT Right 01/07/2016   Procedure: CYSTOSCOPY WITH STENT REPLACEMENT;  Surgeon: Emma Espy, MD;  Location: [facility] ORS;  Service:  Urology;  Laterality: Right;  . CYSTOSCOPY WITH RETROGRADE PYELOGRAM, URETEROSCOPY AND STENT PLACEMENT Right 12/19/2015   Procedure: CYSTOSCOPY WITH RETROGRADE PYELOGRAM, URETEROSCOPY AND STENT PLACEMENT;  Surgeon: Emma Hughs, MD;  Location: [facility] ORS;  Service: Urology;  Laterality: Right;  . URETEROSCOPY WITH HOLMIUM LASER LITHOTRIPSY Right 01/07/2016   Procedure: URETEROSCOPY WITH HOLMIUM LASER LITHOTRIPSY;  Surgeon: Emma Espy, MD;  Location: [facility] ORS;  Service: Urology;  Laterality: Right;   Family History  Problem Relation Age of Onset  . Breast cancer Mother 6  . Breast cancer Maternal Aunt 68  . Prostate cancer Father    Social History   Tobacco Use  . Smoking status: Never Smoker  . Smokeless tobacco: Never Used  Substance Use Topics  . Alcohol use: No    Alcohol/week: 0.0 oz   Allergies  Allergen Reactions  . Sulfa Antibiotics     Other reaction(s): Kidney Disorder Other reaction(s): Kidney Disorder Other reaction(s): Kidney Disorder   Prior to Admission medications   Medication Sig Start Date End Date Taking? Authorizing Provider  acetaminophen (TYLENOL) 500 MG tablet Take 500 mg by mouth every 6 (six) hours as needed (for pain.).   Yes [provider]  anastrozole (ARIMIDEX) 1 MG tablet TAKE 1 TABLET BY MOUTH EVERY DAY 12/24/16  Yes Emma Sickle, MD  aspirin EC 81 MG EC tablet Take  1 tablet (81 mg total) by mouth daily. 12/27/15  Yes Emma Loll, MD  Camphor-Menthol-Capsicum (TIGER BALM PAIN RELIEVING) 80-24-16 MG [facility] Place 1 patch onto the skin daily as needed (for knee pain.).   Yes [provider]  furosemide (LASIX) 40 MG tablet Take 40 mg by mouth daily.   Yes [provider]  labetalol (NORMODYNE) 200 MG tablet TAKE 1 TABLET (200MG  TOTAL) BY MOUTH 2 (TWO) TIMES DAILY. 08/09/14  Yes [provider]  levothyroxine (SYNTHROID) 88 MCG tablet Take 88 mcg by mouth daily before breakfast.  06/20/14  Yes [provider]  losartan-hydrochlorothiazide (HYZAAR) 100-25 MG tablet Take 1 tablet by mouth daily.   Yes [provider]  octreotide (SANDOSTATIN LAR) 30 MG injection Inject 30 mg into the muscle every 28 (twenty-eight) days.    Yes [provider]  potassium chloride SA (K-DUR,KLOR-CON) 20 MEQ tablet Take 2 tablets (40 mEq total) by mouth 2 (two) times daily. 09/19/16  Yes [name], Emma A, FNP  Vitamin D, Ergocalciferol, (DRISDOL) 50000 units CAPS capsule TAKE 1 CAPSULE (50,000 UNITS TOTAL) BY MOUTH ONCE A WEEK. 09/30/16  Yes Emma Sickle, MD    Review of Systems  Constitutional: Positive for fatigue. Negative for appetite change.  HENT: Positive for hearing loss. Negative for congestion, postnasal drip and sore throat.   Eyes: Negative.   Respiratory: Positive for shortness of breath (sometimes). Negative for cough and chest tightness.   Cardiovascular: Positive for palpitations (sometimes) and leg swelling (hasn't taken fluid pill yet today). Negative for chest pain.  Gastrointestinal: Negative for abdominal distention and abdominal pain.  Endocrine: Negative.   Genitourinary: Negative.   Musculoskeletal: Positive for arthralgias (both knees). Negative for back pain.  Skin: Negative.   Allergic/Immunologic: Negative.   Neurological: Positive for dizziness (at times). Negative for light-headedness.  Hematological: Negative for adenopathy. Does not bruise/bleed easily.  Psychiatric/Behavioral: Positive for sleep disturbance (sleeping on 1 pillow; sometimes doesn't sleep well). Negative for dysphoric mood and suicidal ideas. The patient is not nervous/anxious.    Vitals:   01/22/17 1348  BP: (!) 174/63  Pulse: (!) 54  Resp: 18  SpO2: 96%  Weight: 211 lb 6 oz (95.9 kg)  Height: 5\' 5"  (1.651 m)   Wt Readings from Last 3 Encounters:  01/22/17 211 lb 6 oz (95.9 kg)  12/12/16 205 lb (93 kg)  12/01/16 211 lb 12 oz (96.1 kg)    Lab Results  Component Value  Date   CREATININE 0.80 12/12/2016   CREATININE 0.81 11/14/2016   CREATININE 0.93 10/16/2016   Physical Exam  Constitutional: She is oriented to person, place, and time. She appears well-developed and well-nourished.  HENT:  Head: Normocephalic and atraumatic.  Right Ear: Decreased hearing is noted.  Left Ear: Decreased hearing is noted.  Neck: Normal range of motion. Neck supple. No JVD present.  Cardiovascular: Normal rate and regular rhythm.  Pulmonary/Chest: Effort normal. She has no wheezes. She has no rales.  Abdominal: Soft. She exhibits no distension. There is no tenderness.  Musculoskeletal: She exhibits edema (1+ pitting edema in bilateral lower legs). She exhibits no tenderness.  Neurological: She is alert and oriented to person, place, and time.  Skin: Skin is warm and dry.  Psychiatric: She has a normal mood and affect. Her behavior is normal. Thought content normal.  Nursing note and vitals reviewed.  Assessment & Plan:  1: Chronic heart failure with preserved ejection fraction- - NYHA class II - mildly fluid overloaded today  but she hasn't taken her diuretic yet today. Will take it when she returns home - already weighing daily & says that she's gradually gained some weight over the holidays. Instructed to call for an overnight weight gain of >2 pounds or a weekly weight gain of >5 pounds.  - weight up 5 pounds since she was last here - not adding salt to her food. Currently living with her daughter & son-in-law who is doing the shopping/cooking. Does admit to eating saltier foods at times - saw her cardiologist ([place]) 12/24/16 - reports receiving her flu vaccine for the season  2: HTN- - BP initially elevated but improving upon recheck with a manual cuff (160/78) - last saw her PCP Kary Kos) on 11/25/16 - reviewed BMP drawn on 12/12/16; sodium 136, potassium 3.2 and GFR >60 - says that she's taking potassium 55meq twice daily  3: Carcinoid tumor of small  intestine- - being followed closely at the cancer center & was last seen 12/12/16  Medication list was reviewed.   Return in 3 months or sooner for any questions/problems before then

## 2017-01-27 ENCOUNTER — Other Ambulatory Visit: Payer: Self-pay | Admitting: Internal Medicine

## 2017-01-27 DIAGNOSIS — Z79811 Long term (current) use of aromatase inhibitors: Secondary | ICD-10-CM

## 2017-02-06 ENCOUNTER — Telehealth: Payer: Self-pay | Admitting: Internal Medicine

## 2017-02-06 ENCOUNTER — Inpatient Hospital Stay: Payer: Medicare Other | Attending: Internal Medicine

## 2017-02-06 ENCOUNTER — Inpatient Hospital Stay (HOSPITAL_BASED_OUTPATIENT_CLINIC_OR_DEPARTMENT_OTHER): Payer: Medicare Other | Admitting: Internal Medicine

## 2017-02-06 ENCOUNTER — Encounter: Payer: Self-pay | Admitting: Internal Medicine

## 2017-02-06 ENCOUNTER — Inpatient Hospital Stay: Payer: Medicare Other

## 2017-02-06 ENCOUNTER — Other Ambulatory Visit: Payer: Self-pay

## 2017-02-06 VITALS — BP 172/76 | HR 58 | Temp 97.9°F | Resp 20

## 2017-02-06 DIAGNOSIS — E039 Hypothyroidism, unspecified: Secondary | ICD-10-CM | POA: Diagnosis not present

## 2017-02-06 DIAGNOSIS — I130 Hypertensive heart and chronic kidney disease with heart failure and stage 1 through stage 4 chronic kidney disease, or unspecified chronic kidney disease: Secondary | ICD-10-CM | POA: Insufficient documentation

## 2017-02-06 DIAGNOSIS — C7A019 Malignant carcinoid tumor of the small intestine, unspecified portion: Secondary | ICD-10-CM | POA: Insufficient documentation

## 2017-02-06 DIAGNOSIS — D509 Iron deficiency anemia, unspecified: Secondary | ICD-10-CM | POA: Diagnosis not present

## 2017-02-06 DIAGNOSIS — Z79899 Other long term (current) drug therapy: Secondary | ICD-10-CM | POA: Insufficient documentation

## 2017-02-06 DIAGNOSIS — C7B02 Secondary carcinoid tumors of liver: Secondary | ICD-10-CM | POA: Diagnosis not present

## 2017-02-06 DIAGNOSIS — Z853 Personal history of malignant neoplasm of breast: Secondary | ICD-10-CM

## 2017-02-06 DIAGNOSIS — Z79811 Long term (current) use of aromatase inhibitors: Secondary | ICD-10-CM

## 2017-02-06 DIAGNOSIS — K6389 Other specified diseases of intestine: Secondary | ICD-10-CM

## 2017-02-06 DIAGNOSIS — Z86718 Personal history of other venous thrombosis and embolism: Secondary | ICD-10-CM | POA: Diagnosis not present

## 2017-02-06 DIAGNOSIS — E340 Carcinoid syndrome: Secondary | ICD-10-CM | POA: Diagnosis not present

## 2017-02-06 DIAGNOSIS — R5383 Other fatigue: Secondary | ICD-10-CM | POA: Diagnosis not present

## 2017-02-06 DIAGNOSIS — N189 Chronic kidney disease, unspecified: Secondary | ICD-10-CM | POA: Insufficient documentation

## 2017-02-06 LAB — COMPREHENSIVE METABOLIC PANEL
ALT: 16 U/L (ref 14–54)
ANION GAP: 9 (ref 5–15)
AST: 25 U/L (ref 15–41)
Albumin: 4 g/dL (ref 3.5–5.0)
Alkaline Phosphatase: 84 U/L (ref 38–126)
BUN: 20 mg/dL (ref 6–20)
CALCIUM: 9.2 mg/dL (ref 8.9–10.3)
CHLORIDE: 100 mmol/L — AB (ref 101–111)
CO2: 28 mmol/L (ref 22–32)
Creatinine, Ser: 0.92 mg/dL (ref 0.44–1.00)
GFR, EST NON AFRICAN AMERICAN: 59 mL/min — AB (ref >60)
Glucose, Bld: 116 mg/dL — ABNORMAL HIGH (ref 65–99)
POTASSIUM: 3.5 mmol/L (ref 3.5–5.1)
Sodium: 137 mmol/L (ref 135–145)
TOTAL PROTEIN: 7 g/dL (ref 6.5–8.1)
Total Bilirubin: 0.8 mg/dL (ref 0.3–1.2)

## 2017-02-06 LAB — CBC WITH DIFFERENTIAL/PLATELET
BASOS ABS: 0 10*3/uL (ref 0–0.1)
BASOS PCT: 1 %
EOS ABS: 0.3 10*3/uL (ref 0–0.7)
EOS PCT: 5 %
HCT: 37.8 % (ref 35.0–47.0)
Hemoglobin: 12.4 g/dL (ref 12.0–16.0)
Lymphocytes Relative: 11 %
Lymphs Abs: 0.6 10*3/uL — ABNORMAL LOW (ref 1.0–3.6)
MCH: 29.1 pg (ref 26.0–34.0)
MCHC: 32.8 g/dL (ref 32.0–36.0)
MCV: 88.6 fL (ref 80.0–100.0)
MONO ABS: 0.7 10*3/uL (ref 0.2–0.9)
MONOS PCT: 13 %
NEUTROS ABS: 3.8 10*3/uL (ref 1.4–6.5)
Neutrophils Relative %: 70 %
PLATELETS: 187 10*3/uL (ref 150–440)
RBC: 4.27 MIL/uL (ref 3.80–5.20)
RDW: 13.6 % (ref 11.5–14.5)
WBC: 5.4 10*3/uL (ref 3.6–11.0)

## 2017-02-06 MED ORDER — OCTREOTIDE ACETATE 20 MG IM KIT
20.0000 mg | PACK | Freq: Once | INTRAMUSCULAR | Status: AC
  Administered 2017-02-06: 20 mg via INTRAMUSCULAR
  Filled 2017-02-06: qty 1

## 2017-02-06 MED ORDER — ANASTROZOLE 1 MG PO TABS: 1.0000 mg | ORAL_TABLET | Freq: Every day | ORAL | 6 refills | Status: DC

## 2017-02-06 NOTE — Telephone Encounter (Signed)
x

## 2017-02-06 NOTE — Progress Notes (Signed)
[place] OFFICE PROGRESS NOTE  [name], [name] [name], [name] [name], [name] [name], [name]   SUMMARY OF ONCOLOGIC HISTORY:  Oncology History   # JUNE 2015-MESENTERIC MASS [~5-6cm] Presumed CARCINOID with mets to liver [AUG 2016-Octreoscan; Dr.Hurwitz; [facility]]; FEB 2016- START OCTREOTIDE LAR qM; Octreo scan- FEB 2017- STABLE; cont Octreotide'; OCT 5th OCTREO SCAN- STABLE; mesenteric mass present.   # FEB 2018- Ga PET- uptake in liver/ mesenteric mass  # Jan/FEB 2018- Liver MRI- "fat sparing" Bx- neg; no further wu recom  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] s/p Lumpec & RT; ER/PR > 90%; Her2 Neu-NEG; Arimidex; MAY 2016- Mammo-NEG; May 1st HOLD Arimidex sec to myalgias  # IDA s/p IV iron; last March 2014; [place] [Dr.Elliot; Jan 2016] colon angiotele- s/p Argon laser; April 2017- [facility]  # DVT [taken off eliquis DEC 2017]; NOV -DEC 2017 CHF/ UTI with sepsis- stone extracted [Dr.Brandon]  # Thyroid nodule- [facility] s/p Bx [NOV 2016]/ right adnexal mass [stable since 2010]; hard of hearing     Carcinoid tumor of small intestine, malignant ([place])     INTERVAL HISTORY: Patient is a poor historian/hard of hearing. No family.   A very pleasant 77 year old female patient with above history of breast cancer and also mesenteric mass/small bowel carcinoid is here for follow-up/ Proceed with Sandostatin.  Patient complains of fatigue.  Otherwise denies any unusual shortness of breath or cough.  Denies any blood in stools black colored stools.  She has been taking her potassium pills.  No recent hospitalizations.  No swelling in the legs no wheezing.  No abdominal bloating or abdominal pain.  REVIEW OF SYSTEMS:  A complete 10 point review of system is done which is  negative except mentioned above/history of present illness.   PAST MEDICAL HISTORY :  Past Medical History:  Diagnosis Date  . Aromatase inhibitor use   . Breast cancer ([place]) 2014   left breast cancer/radiation  . Breast cancer, left breast ([place]) 06/19/2014  . CHF (congestive heart failure) (HCC)    recent sepsis  . Chronic kidney disease   . Dizziness   . Dyspnea    recently while admitted  . GERD (gastroesophageal reflux disease)   . Hearing loss   . History of hiatal hernia   . History of kidney stones   . HOH (hard of hearing)    severe  . Hypertension   . Hypothyroidism   . Leg DVT (deep venous thromboembolism), acute ([place]) 07/2015   left leg after fracture  . 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 Pain    chest pain while admitted  . Thyroid disease     PAST SURGICAL HISTORY :   Past Surgical History:  Procedure Laterality Date  . ABDOMINAL HYSTERECTOMY     partial  . BREAST EXCISIONAL BIOPSY Left 12/2012   +  . BREAST LUMPECTOMY Left 2014  . BREAST LUMPECTOMY WITH SENTINEL LYMPH NODE BIOPSY Left 2014  . CHOLECYSTECTOMY    . CYSTOSCOPY W/ URETERAL STENT PLACEMENT Right 01/07/2016   Procedure: CYSTOSCOPY WITH STENT REPLACEMENT;  Surgeon: Hollice Espy, MD;  Location: [facility] ORS;  Service: Urology;  Laterality: Right;  . CYSTOSCOPY WITH RETROGRADE PYELOGRAM, URETEROSCOPY AND STENT PLACEMENT Right 12/19/2015   Procedure: CYSTOSCOPY WITH RETROGRADE PYELOGRAM, URETEROSCOPY AND STENT PLACEMENT;  Surgeon: Ardis Hughs, MD;  Location: [facility] OR[facility];  Service: Urology;  Laterality: Right;  . URETEROSCOPY WITH HOLMIUM LASER LITHOTRIPSY Right 01/07/2016   Procedure: URETEROSCOPY WITH HOLMIUM LASER LITHOTRIPSY;  Surgeon: Hollice Espy, MD;  Location: [facility] ORS;  Service: Urology;  Laterality: Right;    FAMILY HISTORY :   Family History  Problem Relation Age of Onset  . Breast cancer Mother 7  . Breast cancer Maternal Aunt 68  . Prostate cancer Father     SOCIAL HISTORY:    Social History   Tobacco Use  . Smoking status: Never Smoker  . Smokeless tobacco: Never Used  Substance Use Topics  . Alcohol use: No    Alcohol/week: 0.0 oz  . Drug use: No    ALLERGIES:  is allergic to sulfa antibiotics.  MEDICATIONS:  Current Outpatient Medications  Medication Sig Dispense Refill  . acetaminophen (TYLENOL) 500 MG tablet Take 500 mg by mouth every 6 (six) hours as needed (for pain.).    Marland Kitchen anastrozole (ARIMIDEX) 1 MG tablet Take 1 tablet (1 mg total) by mouth daily. 90 tablet 6  . aspirin EC 81 MG EC tablet Take 1 tablet (81 mg total) by mouth daily. 30 tablet 2  . Camphor-Menthol-Capsicum (TIGER BALM PAIN RELIEVING) 80-24-16 MG [facility] Place 1 patch onto the skin daily as needed (for knee pain.).    Marland Kitchen furosemide (LASIX) 40 MG tablet Take 40 mg by mouth daily.    Marland Kitchen labetalol (NORMODYNE) 200 MG tablet TAKE 1 TABLET ('200MG'$  TOTAL) BY MOUTH 2 (TWO) TIMES DAILY.    Marland Kitchen levothyroxine (SYNTHROID) 88 MCG tablet Take 88 mcg by mouth daily before breakfast.     . losartan-hydrochlorothiazide (HYZAAR) 100-25 MG tablet Take 1 tablet by mouth daily.    Marland Kitchen octreotide (SANDOSTATIN LAR) 30 MG injection Inject 30 mg into the muscle every 28 (twenty-eight) days.     . potassium chloride SA (K-DUR,KLOR-CON) 20 MEQ tablet Take 2 tablets (40 mEq total) by mouth 2 (two) times daily. 120 tablet 5  . Vitamin D, Ergocalciferol, (DRISDOL) 50000 units CAPS capsule TAKE 1 CAPSULE (50,000 UNITS TOTAL) BY MOUTH ONCE A WEEK. 24 capsule 1   No current facility-administered medications for this visit.     PHYSICAL EXAMINATION: ECOG PERFORMANCE STATUS: 0 - Asymptomatic  BP (!) 172/76   Pulse (!) 58   Temp 97.9 F (36.6 C) (Tympanic)   Resp 20   There were no vitals filed for this visit. GENERAL: Well-nourished well-developed; Alert, no distress and comfortable.hard of hearing.She is in a wheelchair. She is alone. EYES: no pallor or icterus OROPHARYNX: no thrush or ulceration; good dentition   NECK: supple, no masses felt LYMPH: no palpable lymphadenopathy in the cervical, axillary or inguinal regions LUNGS: clear to auscultation and No wheeze or crackles HEART/CVS: regular rate & rhythm and no murmurs; No lower extremity edema; ABDOMEN:abdomen soft, non-tender and normal bowel sounds Musculoskeletal:no cyanosis of digits and no clubbing  PSYCH: alert & oriented x 3 with fluent speech NEURO: no focal motor/sensory deficits SKIN: no rashes or significant lesions  LABORATORY DATA:  I have reviewed the data as listed    Component Value Date/Time   NA 137 02/06/2017 1018   NA 137 07/12/2013 0841   K 3.5 02/06/2017 1018   K 4.7 07/12/2013 0841   CL 100 (L) 02/06/2017 1018   CL 107 07/12/2013 0841   CO2 28 02/06/2017 1018   CO2 20 (L) 07/12/2013 0841   GLUCOSE 116 (H) 02/06/2017 1018   GLUCOSE 122 (H) 07/12/2013  0841   BUN 20 02/06/2017 1018   BUN 18 07/12/2013 0841   CREATININE 0.92 02/06/2017 1018   CREATININE 0.64 05/22/2014 1111   CALCIUM 9.2 02/06/2017 1018   CALCIUM 9.2 07/12/2013 0841   PROT 7.0 02/06/2017 1018   PROT 7.0 05/22/2014 1111   ALBUMIN 4.0 02/06/2017 1018   ALBUMIN 4.2 05/22/2014 1111   AST 25 02/06/2017 1018   AST 29 05/22/2014 1111   ALT 16 02/06/2017 1018   ALT 17 05/22/2014 1111   ALKPHOS 84 02/06/2017 1018   ALKPHOS 95 05/22/2014 1111   BILITOT 0.8 02/06/2017 1018   BILITOT 0.8 05/22/2014 1111   GFRNONAA 59 (L) 02/06/2017 1018   GFRNONAA >60 05/22/2014 1111   GFRAA >60 02/06/2017 1018   GFRAA >60 05/22/2014 1111    No results found for: SPEP, UPEP  Lab Results  Component Value Date   WBC 5.4 02/06/2017   NEUTROABS 3.8 02/06/2017   HGB 12.4 02/06/2017   HCT 37.8 02/06/2017   MCV 88.6 02/06/2017   PLT 187 02/06/2017      Chemistry      Component Value Date/Time   NA 137 02/06/2017 1018   NA 137 07/12/2013 0841   K 3.5 02/06/2017 1018   K 4.7 07/12/2013 0841   CL 100 (L) 02/06/2017 1018   CL 107 07/12/2013 0841    CO2 28 02/06/2017 1018   CO2 20 (L) 07/12/2013 0841   BUN 20 02/06/2017 1018   BUN 18 07/12/2013 0841   CREATININE 0.92 02/06/2017 1018   CREATININE 0.64 05/22/2014 1111      Component Value Date/Time   CALCIUM 9.2 02/06/2017 1018   CALCIUM 9.2 07/12/2013 0841   ALKPHOS 84 02/06/2017 1018   ALKPHOS 95 05/22/2014 1111   AST 25 02/06/2017 1018   AST 29 05/22/2014 1111   ALT 16 02/06/2017 1018   ALT 17 05/22/2014 1111   BILITOT 0.8 02/06/2017 1018   BILITOT 0.8 05/22/2014 1111        ASSESSMENT & PLAN:  Carcinoid tumor of small intestine, malignant ([place]) # 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 No clinical progression noted.   # Continue Octroetide shots monthly. Due for one today. Awaiting on urine 5HIAA.   # Iron deficiency anemia/Hx of AV malformation- continue by mouth iron at this time.  # fatigue- check TSH today.   # LEFT BREAST CA STAGE I; ER/PR positive HER-2/neu negative. May 2018- mammogram- wnl.  Currently on Arimidex.  Clinically no evidence of recurrence. New script given.   # Follow-up with me in 2 months/labs; labs/ sandostain Shots monthly.       [name], MD 02/06/2017 1:10 PM

## 2017-02-06 NOTE — Assessment & Plan Note (Addendum)
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 No clinical progression noted.   # Continue Octroetide shots monthly. Due for one today. Awaiting on urine 5HIAA.   # Iron deficiency anemia/Hx of AV malformation- continue by mouth iron at this time.  # fatigue- check TSH today.   # LEFT BREAST CA STAGE I; ER/PR positive HER-2/neu negative. May 2018- mammogram- wnl.  Currently on Arimidex.  Clinically no evidence of recurrence. New script given.   # Follow-up with me in 2 months/labs; labs/ sandostain Shots monthly.

## 2017-02-11 LAB — 5 HIAA, QUANTITATIVE, URINE, 24 HOUR
5 HIAA UR: 2.8 mg/L
5-HIAA,Quant.,24 Hr Urine: 2.2 mg/24 hr (ref 0.0–14.9)
TOTAL VOLUME: 800

## 2017-02-11 LAB — HEMOGLOBIN A1C
Hgb A1c MFr Bld: 6 % — ABNORMAL HIGH (ref 4.8–5.6)
MEAN PLASMA GLUCOSE: 125.5 mg/dL

## 2017-03-06 ENCOUNTER — Inpatient Hospital Stay: Payer: Medicare Other | Attending: Internal Medicine

## 2017-03-06 DIAGNOSIS — E340 Carcinoid syndrome: Secondary | ICD-10-CM | POA: Diagnosis not present

## 2017-03-06 DIAGNOSIS — C7A019 Malignant carcinoid tumor of the small intestine, unspecified portion: Secondary | ICD-10-CM | POA: Diagnosis present

## 2017-03-06 DIAGNOSIS — K6389 Other specified diseases of intestine: Secondary | ICD-10-CM

## 2017-03-06 MED ORDER — OCTREOTIDE ACETATE 20 MG IM KIT
20.0000 mg | PACK | Freq: Once | INTRAMUSCULAR | Status: AC
  Administered 2017-03-06: 20 mg via INTRAMUSCULAR
  Filled 2017-03-06: qty 1

## 2017-04-01 ENCOUNTER — Telehealth: Payer: Self-pay | Admitting: *Deleted

## 2017-04-01 ENCOUNTER — Telehealth: Payer: Self-pay | Admitting: Genetic Counselor

## 2017-04-01 NOTE — Telephone Encounter (Signed)
Dr. Rogue Bussing is referring Emma [name] for genetic counseling due to a personal history of cancer. Per her request, this is coordinated through her daughter, [name]. I will speak to [name] on Friday 04/03/17.   Steele Berg, [place], [place] Genetic Counselor Phone: [phone]

## 2017-04-01 NOTE — Telephone Encounter (Signed)
Patient's daughter is requesting genetic testing.  She is worried about her personal risk factors.   Pt has breast cancer and carcinoid. She states that she talked with her pcp/gyn. She was considering surgical options to lower her risk factors. Gyn advised her to have her mother tested first.  She states that her mother is agreeable. Due to pt's hearing loss, the daughter wanted to know if pt could have genetic testing at her next apt on 3/25.  Daughter prefers that Ofri return her phone call on her home phone [phone] or Work number:  [phone].  I spoke with Dr. Rogue Bussing. MD would like me to enter a referral to [facility]- Dietitian.    Co[place], please remove pt's daughter's phone number of [phone]- this is not an active #

## 2017-04-03 ENCOUNTER — Ambulatory Visit: Payer: Medicare Other | Admitting: Internal Medicine

## 2017-04-03 ENCOUNTER — Ambulatory Visit: Payer: Medicare Other

## 2017-04-03 ENCOUNTER — Telehealth: Payer: Self-pay | Admitting: Genetic Counselor

## 2017-04-03 ENCOUNTER — Other Ambulatory Visit: Payer: Medicare Other

## 2017-04-03 ENCOUNTER — Encounter: Payer: Self-pay | Admitting: Genetic Counselor

## 2017-04-03 NOTE — Telephone Encounter (Signed)
           Cancer Genetics            Telegenetics Initial Visit    Patient Name: Emma [name] Patient DOB: 05/30/1940 Patient Age: 77 y.o. Phone Call Date: 04/03/2017  Referring Provider: Govinda Brahmanday, MD  Reason for Visit: Evaluate for hereditary susceptibility to cancer    Assessment and Plan:  . Emma [name]'s history is not suggestive of a hereditary predisposition to cancer given her own age at diagnosis. However, she meets NCCN criteria for genetic testing due to her personal history of breast cancer in combination with 2 close blood relatives with breast cancer, regardless of ages at diagnosis.   . Testing is recommended to determine whether she has a pathogenic mutation that will impact her screening and risk-reduction for cancer. A negative result will be reassuring.  . Emma [name] previously spoke with her daughter, [name], and stated that she wished to pursue genetic testing. She will have her blood drawn during her lab visit already scheduled on 04/20/17. Analysis will include the 83 genes on [facility]'s Multi-Cancer panel (ALK, APC, ATM, AXIN2, BAP1, BARD1, BLM, BMPR1A, BRCA1, BRCA2, BRIP1, CASR, CDC73, CDH1, CDK4, CDKN1B, CDKN1C, CDKN2A, CEBPA, CHEK2, CTNNA1, DICER1, DIS3L2, EGFR, EPCAM, FH, FLCN, GATA2, GPC3, GREM1, HOXB13, HRAS, KIT, MAX, MEN1, MET, MITF, MLH1, MSH2, MSH3, MSH6, MUTYH, NBN, NF1, NF2, NTHL1, PALB2, PDGFRA, PHOX2B, PMS2, POLD1, POLE, POT1, PRKAR1A, PTCH1, PTEN, RAD50, RAD51C, RAD51D, RB1, RECQL4, RET, RUNX1, SDHA, SDHAF2, SDHB, SDHC, SDHD, SMAD4, SMARCA4, SMARCB1, SMARCE1, STK11, SUFU, TERC, TERT, TMEM127, TP53, TSC1, TSC2, VHL, WRN, WT1).  . Results should be available in approximately 2-3 weeks, at which point we will contact her and address implications for her as well as address genetic testing for at-risk family members, if needed.     Dr. Finnegan was available for questions concerning this case. Total time spent by counseling by phone was  approximately 25 minutes.   _____________________________________________________________________   History of Present Illness: Emma [name], a 77 y.o. female, was referred for genetic counseling to discuss the possibility of a hereditary predisposition to cancer and discuss whether genetic testing is warranted. This was a telegenetics visit via phone. Per her request, it was conducted with her daughter, [name], because Emma [name] has a challenging time hearing, especially by phone.  Emma [name] was initially diagnosed with breast cancer at the age of 72. 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 Aromatase inhibitor use   . Breast cancer ([place]) 2014   left breast cancer/radiation  . Breast cancer, left breast ([place]) 06/19/2014  . CHF (congestive heart failure) (HCC)    recent sepsis  . Chronic kidney disease   . Dizziness   . Dyspnea    recently while admitted  . GERD  (gastroesophageal reflux disease)   . Hearing loss   . History of hiatal hernia   . History of kidney stones   . HOH (hard of hearing)    severe  . Hypertension   . Hypothyroidism   . Leg DVT (deep venous thromboembolism), acute ([place]) 07/2015   left leg after fracture  . 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 Pain    chest pain while admitted  . Thyroid disease     Past Surgical History:  Procedure Laterality Date  . ABDOMINAL HYSTERECTOMY     partial  . BREAST EXCISIONAL BIOPSY Left 12/2012   +  . BREAST LUMPECTOMY Left 2014  . BREAST LUMPECTOMY WITH SENTINEL LYMPH NODE BIOPSY Left 2014  . CHOLECYSTECTOMY    . CYSTOSCOPY W/ URETERAL STENT PLACEMENT Right 01/07/2016   Procedure: CYSTOSCOPY WITH STENT REPLACEMENT;  Surgeon: Hollice Espy, MD;  Location: [facility] ORS;  Service: Urology;  Laterality: Right;  . CYSTOSCOPY WITH RETROGRADE PYELOGRAM, URETEROSCOPY AND STENT PLACEMENT Right 12/19/2015   Procedure: CYSTOSCOPY WITH RETROGRADE PYELOGRAM, URETEROSCOPY AND STENT PLACEMENT;  Surgeon: Ardis Hughs, MD;  Location: [facility] ORS;  Service: Urology;  Laterality: Right;  . URETEROSCOPY WITH HOLMIUM LASER LITHOTRIPSY Right 01/07/2016   Procedure: URETEROSCOPY WITH HOLMIUM LASER LITHOTRIPSY;  Surgeon: Hollice Espy, MD;  Location: [facility] ORS;  Service: Urology;  Laterality: Right;    Family History: Significant diagnoses include the following:  Family History  Problem Relation Age of Onset  . Breast cancer Mother 30       deceased 60  . Breast cancer Maternal Aunt 68       deceased 66s  . Prostate cancer Father        deceased 29  . Colon cancer Paternal Aunt 79       deceased 51s    Additionally, Emma [name] has 2 daughters (ages 71 and 1). She has no siblings. Her mother (noted above) had 4 brothers and a sister (noted above). Her father (noted above) had 2 brothers and 2 sisters.  Emma [name] ancestry is Caucasian - NOS. There is no known Jewish ancestry and no  consanguinity.  Discussion: We reviewed the characteristics, features and inheritance patterns of hereditary cancer syndromes. We discussed her risk of harboring a mutation in the context of her personal and family history. We discussed the process of genetic testing, insurance coverage and implications of results: positive, negative and variant of unknown significance (VUS).   Ms. Landrus questions were answered to her satisfaction today and she is welcome to call with any additional questions or concerns. Thank you for the referral and allowing [place] to share in the care of your patient.    Steele Berg, MS, [place] Certified Genetic Counselor phone: [phone]

## 2017-04-05 ENCOUNTER — Inpatient Hospital Stay
Admission: EM | Admit: 2017-04-05 | Discharge: 2017-04-07 | DRG: 543 | Disposition: A | Discharge status: Home Health | Payer: Medicare Other | Attending: Internal Medicine | Admitting: Internal Medicine

## 2017-04-05 ENCOUNTER — Other Ambulatory Visit: Payer: Self-pay

## 2017-04-05 ENCOUNTER — Emergency Department: Payer: Medicare Other

## 2017-04-05 DIAGNOSIS — W1830XA Fall on same level, unspecified, initial encounter: Secondary | ICD-10-CM | POA: Diagnosis present

## 2017-04-05 DIAGNOSIS — Z8589 Personal history of malignant neoplasm of other organs and systems: Secondary | ICD-10-CM | POA: Diagnosis not present

## 2017-04-05 DIAGNOSIS — Z79811 Long term (current) use of aromatase inhibitors: Secondary | ICD-10-CM | POA: Diagnosis not present

## 2017-04-05 DIAGNOSIS — M79604 Pain in right leg: Secondary | ICD-10-CM | POA: Diagnosis not present

## 2017-04-05 DIAGNOSIS — E039 Hypothyroidism, unspecified: Secondary | ICD-10-CM | POA: Diagnosis present

## 2017-04-05 DIAGNOSIS — Z960 Presence of urogenital implants: Secondary | ICD-10-CM | POA: Diagnosis present

## 2017-04-05 DIAGNOSIS — Z7982 Long term (current) use of aspirin: Secondary | ICD-10-CM

## 2017-04-05 DIAGNOSIS — Z8042 Family history of malignant neoplasm of prostate: Secondary | ICD-10-CM

## 2017-04-05 DIAGNOSIS — Z882 Allergy status to sulfonamides status: Secondary | ICD-10-CM | POA: Diagnosis not present

## 2017-04-05 DIAGNOSIS — Z79890 Hormone replacement therapy (postmenopausal): Secondary | ICD-10-CM | POA: Diagnosis not present

## 2017-04-05 DIAGNOSIS — H9190 Unspecified hearing loss, unspecified ear: Secondary | ICD-10-CM | POA: Diagnosis present

## 2017-04-05 DIAGNOSIS — X500XXA Overexertion from strenuous movement or load, initial encounter: Secondary | ICD-10-CM

## 2017-04-05 DIAGNOSIS — M1711 Unilateral primary osteoarthritis, right knee: Secondary | ICD-10-CM | POA: Diagnosis present

## 2017-04-05 DIAGNOSIS — Z923 Personal history of irradiation: Secondary | ICD-10-CM | POA: Diagnosis not present

## 2017-04-05 DIAGNOSIS — I110 Hypertensive heart disease with heart failure: Secondary | ICD-10-CM | POA: Diagnosis present

## 2017-04-05 DIAGNOSIS — E559 Vitamin D deficiency, unspecified: Secondary | ICD-10-CM | POA: Diagnosis present

## 2017-04-05 DIAGNOSIS — C50912 Malignant neoplasm of unspecified site of left female breast: Secondary | ICD-10-CM | POA: Diagnosis present

## 2017-04-05 DIAGNOSIS — S82191A Other fracture of upper end of right tibia, initial encounter for closed fracture: Secondary | ICD-10-CM

## 2017-04-05 DIAGNOSIS — I5032 Chronic diastolic (congestive) heart failure: Secondary | ICD-10-CM | POA: Diagnosis present

## 2017-04-05 DIAGNOSIS — Z803 Family history of malignant neoplasm of breast: Secondary | ICD-10-CM | POA: Diagnosis not present

## 2017-04-05 DIAGNOSIS — E340 Carcinoid syndrome: Secondary | ICD-10-CM | POA: Diagnosis present

## 2017-04-05 DIAGNOSIS — Z800 Family history of malignant neoplasm of digestive organs: Secondary | ICD-10-CM | POA: Diagnosis not present

## 2017-04-05 DIAGNOSIS — M84363A Stress fracture, right fibula, initial encounter for fracture: Secondary | ICD-10-CM | POA: Diagnosis not present

## 2017-04-05 DIAGNOSIS — M84361A Stress fracture, right tibia, initial encounter for fracture: Secondary | ICD-10-CM | POA: Diagnosis present

## 2017-04-05 DIAGNOSIS — S82409A Unspecified fracture of shaft of unspecified fibula, initial encounter for closed fracture: Secondary | ICD-10-CM | POA: Diagnosis present

## 2017-04-05 DIAGNOSIS — S82209A Unspecified fracture of shaft of unspecified tibia, initial encounter for closed fracture: Secondary | ICD-10-CM | POA: Diagnosis present

## 2017-04-05 DIAGNOSIS — S82831A Other fracture of upper and lower end of right fibula, initial encounter for closed fracture: Secondary | ICD-10-CM

## 2017-04-05 DIAGNOSIS — Y92009 Unspecified place in unspecified non-institutional (private) residence as the place of occurrence of the external cause: Secondary | ICD-10-CM

## 2017-04-05 LAB — CBC
HEMATOCRIT: 39.9 % (ref 35.0–47.0)
HEMOGLOBIN: 13.2 g/dL (ref 12.0–16.0)
MCH: 29 pg (ref 26.0–34.0)
MCHC: 33.1 g/dL (ref 32.0–36.0)
MCV: 87.8 fL (ref 80.0–100.0)
Platelets: 194 10*3/uL (ref 150–440)
RBC: 4.55 MIL/uL (ref 3.80–5.20)
RDW: 14.8 % — ABNORMAL HIGH (ref 11.5–14.5)
WBC: 9.5 10*3/uL (ref 3.6–11.0)

## 2017-04-05 LAB — TSH: TSH: 3.356 u[IU]/mL (ref 0.350–4.500)

## 2017-04-05 LAB — BASIC METABOLIC PANEL
Anion gap: 12 (ref 5–15)
BUN: 26 mg/dL — ABNORMAL HIGH (ref 6–20)
CHLORIDE: 101 mmol/L (ref 101–111)
CO2: 26 mmol/L (ref 22–32)
CREATININE: 0.87 mg/dL (ref 0.44–1.00)
Calcium: 9.8 mg/dL (ref 8.9–10.3)
GFR calc non Af Amer: >60 mL/min (ref >60)
Glucose, Bld: 137 mg/dL — ABNORMAL HIGH (ref 65–99)
Potassium: 4.1 mmol/L (ref 3.5–5.1)
Sodium: 139 mmol/L (ref 135–145)

## 2017-04-05 LAB — TYPE AND SCREEN
ABO/RH(D): O POS
ANTIBODY SCREEN: NEGATIVE

## 2017-04-05 LAB — SURGICAL PCR SCREEN
MRSA, PCR: NEGATIVE
Staphylococcus aureus: POSITIVE — AB

## 2017-04-05 MED ORDER — ONDANSETRON HCL 4 MG PO TABS: 4.0000 mg | ORAL_TABLET | Freq: Four times a day (QID) | ORAL | Status: DC | PRN

## 2017-04-05 MED ORDER — LEVOTHYROXINE SODIUM 88 MCG PO TABS
88.0000 ug | ORAL_TABLET | Freq: Every day | ORAL | Status: DC
  Administered 2017-04-05 – 2017-04-07 (×3): 88 ug via ORAL
  Filled 2017-04-05 (×3): qty 1

## 2017-04-05 MED ORDER — LOSARTAN POTASSIUM-HCTZ 100-25 MG PO TABS: 1.0000 | ORAL_TABLET | Freq: Every day | ORAL | Status: DC

## 2017-04-05 MED ORDER — HYDROCHLOROTHIAZIDE 25 MG PO TABS
25.0000 mg | ORAL_TABLET | Freq: Every day | ORAL | Status: DC
  Administered 2017-04-05 – 2017-04-07 (×3): 25 mg via ORAL
  Filled 2017-04-05 (×3): qty 1

## 2017-04-05 MED ORDER — OCTREOTIDE ACETATE 30 MG IM KIT: 30.0000 mg | PACK | INTRAMUSCULAR | Status: DC

## 2017-04-05 MED ORDER — ANASTROZOLE 1 MG PO TABS
1.0000 mg | ORAL_TABLET | Freq: Every day | ORAL | Status: DC
  Administered 2017-04-05 – 2017-04-07 (×3): 1 mg via ORAL
  Filled 2017-04-05 (×3): qty 1

## 2017-04-05 MED ORDER — POTASSIUM CHLORIDE CRYS ER 20 MEQ PO TBCR
40.0000 meq | EXTENDED_RELEASE_TABLET | Freq: Two times a day (BID) | ORAL | Status: DC
  Administered 2017-04-05 – 2017-04-07 (×5): 40 meq via ORAL
  Filled 2017-04-05 (×5): qty 2

## 2017-04-05 MED ORDER — ACETAMINOPHEN 650 MG RE SUPP: 650.0000 mg | Freq: Four times a day (QID) | RECTAL | Status: DC | PRN

## 2017-04-05 MED ORDER — CAMPHOR-MENTHOL-CAPSICUM 80-24-16 MG EX PTCH: 1.0000 | MEDICATED_PATCH | Freq: Every day | CUTANEOUS | Status: DC | PRN

## 2017-04-05 MED ORDER — LABETALOL HCL 200 MG PO TABS
200.0000 mg | ORAL_TABLET | Freq: Two times a day (BID) | ORAL | Status: DC
  Administered 2017-04-05 – 2017-04-07 (×4): 200 mg via ORAL
  Filled 2017-04-05 (×6): qty 1

## 2017-04-05 MED ORDER — FENTANYL CITRATE (PF) 100 MCG/2ML IJ SOLN
50.0000 ug | Freq: Once | INTRAMUSCULAR | Status: AC
  Administered 2017-04-05: 50 ug via INTRAVENOUS
  Filled 2017-04-05: qty 2

## 2017-04-05 MED ORDER — ONDANSETRON HCL 4 MG/2ML IJ SOLN: 4.0000 mg | Freq: Four times a day (QID) | INTRAMUSCULAR | Status: DC | PRN

## 2017-04-05 MED ORDER — VITAMIN D (ERGOCALCIFEROL) 1.25 MG (50000 UNIT) PO CAPS
50000.0000 [IU] | ORAL_CAPSULE | ORAL | Status: DC
  Administered 2017-04-05: 50000 [IU] via ORAL
  Filled 2017-04-05: qty 1

## 2017-04-05 MED ORDER — FUROSEMIDE 40 MG PO TABS
40.0000 mg | ORAL_TABLET | Freq: Every day | ORAL | Status: DC
  Administered 2017-04-05 – 2017-04-07 (×3): 40 mg via ORAL
  Filled 2017-04-05 (×3): qty 1

## 2017-04-05 MED ORDER — OXYCODONE-ACETAMINOPHEN 5-325 MG PO TABS
1.0000 | ORAL_TABLET | Freq: Four times a day (QID) | ORAL | Status: DC | PRN
  Administered 2017-04-05 – 2017-04-07 (×5): 1 via ORAL
  Filled 2017-04-05 (×5): qty 1

## 2017-04-05 MED ORDER — SODIUM CHLORIDE 0.9 % IV SOLN: INTRAVENOUS | Status: DC

## 2017-04-05 MED ORDER — LOSARTAN POTASSIUM 50 MG PO TABS
100.0000 mg | ORAL_TABLET | Freq: Every day | ORAL | Status: DC
  Administered 2017-04-05 – 2017-04-07 (×3): 100 mg via ORAL
  Filled 2017-04-05 (×3): qty 2

## 2017-04-05 MED ORDER — MORPHINE SULFATE (PF) 2 MG/ML IV SOLN: 2.0000 mg | INTRAVENOUS | Status: DC | PRN

## 2017-04-05 MED ORDER — DOCUSATE SODIUM 100 MG PO CAPS
100.0000 mg | ORAL_CAPSULE | Freq: Two times a day (BID) | ORAL | Status: DC
  Administered 2017-04-05 – 2017-04-07 (×3): 100 mg via ORAL
  Filled 2017-04-05 (×5): qty 1

## 2017-04-05 MED ORDER — ASPIRIN EC 81 MG PO TBEC
81.0000 mg | DELAYED_RELEASE_TABLET | Freq: Every day | ORAL | Status: DC
  Administered 2017-04-05 – 2017-04-07 (×3): 81 mg via ORAL
  Filled 2017-04-05 (×3): qty 1

## 2017-04-05 MED ORDER — ACETAMINOPHEN 325 MG PO TABS
650.0000 mg | ORAL_TABLET | Freq: Four times a day (QID) | ORAL | Status: DC | PRN
  Administered 2017-04-06: 650 mg via ORAL
  Filled 2017-04-05: qty 2

## 2017-04-05 NOTE — ED Provider Notes (Signed)
[facility] Emergency Department Provider Note   ____________________________________________   First MD Initiated Contact with Patient 04/05/17 [phone]     (approximate)  I have reviewed the triage vital signs and the nursing notes.   HISTORY  Chief Complaint Leg Pain    HPI Emma [name] is a 77 y.o. female who comes into the hospital today with some right leg pain.  The patient states that she had been doing a lot of heavy lifting this week.  Today she was having a lot of pain in her right hip going down her entire leg.  The patient thought maybe she had pulled something.  She states that the pain had been going all the way down to her ankles and the bottom of her feet.  Tonight the patient set up to put the light out and she said that her right leg gave out.  She heard 2 pops and then developed some significant pain.  She states that she tried to put weight back on it and she was unable to do so.  She had to crawl to try to make the phone call.  The patient reports that when she does not move her pain is a 0 but when she moves it to 10 out of 10 in intensity.  The patient called EMS to be brought in for evaluation of this pain.  Past Medical History:  Diagnosis Date  . Aromatase inhibitor use   . Breast cancer ([place]) 2014   left breast cancer/radiation  . Breast cancer, left breast ([place]) 06/19/2014  . CHF (congestive heart failure) (HCC)    recent sepsis  . Chronic kidney disease   . Dizziness   . Dyspnea    recently while admitted  . GERD (gastroesophageal reflux disease)   . Hearing loss   . History of hiatal hernia   . History of kidney stones   . HOH (hard of hearing)    severe  . Hypertension   . Hypothyroidism   . Leg DVT (deep venous thromboembolism), acute ([place]) 07/2015   left leg after fracture  . 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 Pain    chest pain while admitted  . Thyroid disease     Patient Active Problem List   Diagnosis Date Noted   . Liver mass, right lobe 03/28/2016  . Chronic diastolic heart failure ([place]) 02/06/2016  . UTI (urinary tract infection) 01/25/2016  . Sepsis ([place]) 12/19/2015  . Carcinoid tumor of small intestine, malignant ([place]) 10/08/2015  . Fall 08/25/2015  . Anxiety 08/25/2015  . GERD (gastroesophageal reflux disease) 08/25/2015  . HTN (hypertension) 08/25/2015  . Hypothyroidism 08/25/2015  . Iron deficiency anemia due to chronic blood loss 06/19/2014  . 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 ABDOMINAL HYSTERECTOMY     partial  . BREAST EXCISIONAL BIOPSY Left 12/2012   +  . BREAST LUMPECTOMY Left 2014  . BREAST LUMPECTOMY WITH SENTINEL LYMPH NODE BIOPSY Left 2014  . CHOLECYSTECTOMY    . CYSTOSCOPY W/ URETERAL STENT PLACEMENT Right 01/07/2016   Procedure: CYSTOSCOPY WITH STENT REPLACEMENT;  Surgeon: Hollice Espy, MD;  Location: [facility] ORS;  Service: Urology;  Laterality: Right;  . CYSTOSCOPY WITH RETROGRADE PYELOGRAM, URETEROSCOPY AND STENT PLACEMENT Right 12/19/2015   Procedure: CYSTOSCOPY WITH RETROGRADE PYELOGRAM, URETEROSCOPY AND STENT PLACEMENT;  Surgeon: Ardis Hughs, MD;  Location: [facility] ORS;  Service: Urology;  Laterality: Right;  . URETEROSCOPY WITH HOLMIUM LASER LITHOTRIPSY Right  01/07/2016   Procedure: URETEROSCOPY WITH HOLMIUM LASER LITHOTRIPSY;  Surgeon: Hollice Espy, MD;  Location: [facility] ORS;  Service: Urology;  Laterality: Right;    Prior to Admission medications   Medication Sig Start Date End Date Taking? Authorizing Provider  acetaminophen (TYLENOL) 500 MG tablet Take 500 mg by mouth every 6 (six) hours as needed (for pain.).    [provider]  anastrozole (ARIMIDEX) 1 MG tablet Take 1 tablet (1 mg total) by mouth daily. 02/06/17   [name], MD  aspirin EC 81 MG EC tablet Take 1 tablet (81 mg total) by mouth daily. 12/27/15   Demetrios Loll, MD  Camphor-Menthol-Capsicum (TIGER BALM PAIN RELIEVING) 80-24-16  MG [facility] 1 patch onto the skin daily as needed (for knee pain.).    [provider]  furosemide (LASIX) 40 MG tablet Take 40 mg by mouth daily.    [provider]  labetalol (NORMODYNE) 200 MG tablet TAKE 1 TABLET (200MG  TOTAL) BY MOUTH 2 (TWO) TIMES DAILY. 08/09/14   [provider]  levothyroxine (SYNTHROID) 88 MCG tablet Take 88 mcg by mouth daily before breakfast.  06/20/14   [provider]  losartan-hydrochlorothiazide (HYZAAR) 100-25 MG tablet Take 1 tablet by mouth daily.    [provider]  octreotide (SANDOSTATIN LAR) 30 MG injection Inject 30 mg into the muscle every 28 (twenty-eight) days.     [provider]  potassium chloride SA (K-DUR,KLOR-CON) 20 MEQ tablet Take 2 tablets (40 mEq total) by mouth 2 (two) times daily. 09/19/16   [name], FNP  Vitamin D, Ergocalciferol, (DRISDOL) 50000 units CAPS capsule TAKE 1 CAPSULE (50,000 UNITS TOTAL) BY MOUTH ONCE A WEEK. 09/30/16   [name], MD    Allergies Sulfa antibiotics  Family History  Problem Relation Age of Onset  . Breast cancer Mother 33       deceased 39  . Breast cancer Maternal Aunt 68       deceased 60s  . Prostate cancer Father        deceased 4  . Colon cancer Paternal Aunt 71       deceased 60s    Social History Social History   Tobacco Use  . Smoking status: Never Smoker  . Smokeless tobacco: Never Used  Substance Use Topics  . Alcohol use: No    Alcohol/week: 0.0 oz  . Drug use: No    Review of Systems  Constitutional: No fever/chills Eyes: No visual changes. ENT: No sore throat. Cardiovascular: Denies chest pain. Respiratory: Denies shortness of breath. Gastrointestinal: No abdominal pain.  No nausea, no vomiting.  No diarrhea.  No constipation. Genitourinary: Negative for dysuria. Musculoskeletal: Right leg pain Skin: Negative for rash. Neurological: Negative for headaches, focal weakness or  numbness.   ____________________________________________   PHYSICAL EXAM:  VITAL SIGNS: ED Triage Vitals  Enc Vitals Group     BP 04/05/17 0159 (!) 156/137     Pulse Rate 04/05/17 0159 63     Resp 04/05/17 0159 15     Temp 04/05/17 0159 98.2 F (36.8 C)     Temp Source 04/05/17 0159 Oral     SpO2 04/05/17 0159 100 %     Weight 04/05/17 0301 210 lb (95.3 kg)     Height 04/05/17 0301 5\' 6"  (1.676 m)     Head Circumference --      Peak Flow --      Pain Score --      Pain Loc --  Pain Edu? --      Excl. in [place]? --     Constitutional: Alert and oriented. Well appearing and in moderate distress. Eyes: Conjunctivae are normal. PERRL. EOMI. Head: Atraumatic. Nose: No congestion/rhinnorhea. Mouth/Throat: Mucous membranes are moist.  Oropharynx non-erythematous. Cardiovascular: Normal rate, regular rhythm. Grossly normal heart sounds.  Good peripheral circulation. Respiratory: Normal respiratory effort.  No retractions. Lungs CTAB. Gastrointestinal: Soft and nontender. No distention.  Positive bowel sounds Musculoskeletal: Tenderness to palpation of right lower leg with some deformity about the proximal tibia Neurologic:  Normal speech and language.  Skin:  Skin is warm, dry and intact.  Psychiatric: Mood and affect are normal. .  ____________________________________________   LABS (all labs ordered are listed, but only abnormal results are displayed)  Labs Reviewed  CBC - Abnormal; Notable for the following components:      Result Value   RDW 14.8 (*)    All other components within normal limits  BASIC METABOLIC PANEL - Abnormal; Notable for the following components:   Glucose, Bld 137 (*)    BUN 26 (*)    All other components within normal limits  TYPE AND SCREEN   ____________________________________________  EKG  none ____________________________________________  RADIOLOGY  ED MD interpretation:  Dg right tib/fib: Acute mildly comminuted and displaced  fracture of the proximal shaft of the tibia, possible nondisplaced minimally impacted fracture of the fibular neck, possible cortical avulsion inferior lateral talus  Official radiology report(s): Dg Tibia/fibula Right  Result Date: 04/05/2017 CLINICAL DATA:  Leg pain EXAM: RIGHT TIBIA AND FIBULA - 2 VIEW COMPARISON:  None. 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 Possible mildly impacted fracture at the fibular neck. Acute slightly comminuted fracture at the proximal shaft of the tibia with less than 1/4 bone with of posterior displacement of distal fracture fragment. Diffuse soft tissue edema. Moderate plantar calcaneal spur. Possible avulsion adjacent to the lateral talus. IMPRESSION: 1. Acute mildly comminuted and displaced fracture of the proximal shaft of the tibia 2. Possible nondisplaced minimally impacted fracture of the fibular neck. 3. Possible cortical avulsion inferolateral talus, if tender here could obtain dedicated ankle radiographs. Electronically Signed   By: Donavan Foil M.D.   On: 04/05/2017 03:51    ____________________________________________   PROCEDURES  Procedure(s) performed: None  Procedures  Critical Care performed: No  ____________________________________________   INITIAL IMPRESSION / ASSESSMENT AND PLAN / ED COURSE  As part of my medical decision making, I reviewed the following data within the electronic MEDICAL RECORD NUMBER Notes from prior ED visits and [place] Controlled Substance Database   This is a 77 year old female who comes into the hospital today with some right lower leg pain after hearing some popping noises and her leg giving out.  My differential diagnosis includes musculoskeletal pain, tibia versus fibula fracture  We will send the patient for an x-ray of her tib-fib area.  The patient will also receive a CBC and a BMP.  She will be reassessed.  The patient's blood work is unremarkable.  The patient appears to have a proximal tibia fracture  that is mildly comminuted and displaced.  I did contact Dr. Sabra Heck with orthopedic surgery.  He reports that it should be able to heal with a cast as the patient lives home alone and will be nonweightbearing I will admit her to the hospitalist service.  She does have some pain so I will give her some fentanyl.  The patient will be admitted.      ____________________________________________  FINAL CLINICAL IMPRESSION(S) / ED DIAGNOSES  Final diagnoses:  Other closed fracture of proximal end of right tibia, initial encounter  Other closed fracture of proximal end of right fibula, initial encounter     ED Discharge Orders    None       Note:  This document was prepared using Dragon voice recognition software and may include unintentional dictation errors.    Loney Hering, MD 04/05/17 [phone]

## 2017-04-05 NOTE — H&P (Signed)
Emma [name] is an 77 y.o. female.   Chief Complaint: Leg pain HPI: The patient with past medical history of breast cancer, CHF, hypothyroidism, hypertension and metastatic carcinoid syndrome presents emergency department after suffering a fall.  The patient was changing a light bulb when she fell.  She explains pain in her right lower extremity immediately which made it difficult to walk.  In the emergency department x-ray of the limb revealed mildly comminuted fracture of the tibia and fibula.  Orthopedic surgery was contacted and the hospitalist service was called for admission.  Past Medical History:  Diagnosis Date  . Aromatase inhibitor use   . Breast cancer ([place]) 2014   left breast cancer/radiation  . Breast cancer, left breast ([place]) 06/19/2014  . CHF (congestive heart failure) (HCC)    recent sepsis  . Chronic kidney disease   . Dizziness   . Dyspnea    recently while admitted  . GERD (gastroesophageal reflux disease)   . Hearing loss   . History of hiatal hernia   . History of kidney stones   . HOH (hard of hearing)    severe  . Hypertension   . Hypothyroidism   . Leg DVT (deep venous thromboembolism), acute ([place]) 07/2015   left leg after fracture  . 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 Pain    chest pain while admitted  . Thyroid disease     Past Surgical History:  Procedure Laterality Date  . ABDOMINAL HYSTERECTOMY     partial  . BREAST EXCISIONAL BIOPSY Left 12/2012   +  . BREAST LUMPECTOMY Left 2014  . BREAST LUMPECTOMY WITH SENTINEL LYMPH NODE BIOPSY Left 2014  . CHOLECYSTECTOMY    . CYSTOSCOPY W/ URETERAL STENT PLACEMENT Right 01/07/2016   Procedure: CYSTOSCOPY WITH STENT REPLACEMENT;  Surgeon: Hollice Espy, MD;  Location: [facility] ORS;  Service: Urology;  Laterality: Right;  . CYSTOSCOPY WITH RETROGRADE PYELOGRAM, URETEROSCOPY AND STENT PLACEMENT Right 12/19/2015   Procedure: CYSTOSCOPY WITH RETROGRADE PYELOGRAM, URETEROSCOPY AND STENT PLACEMENT;  Surgeon: Ardis Hughs, MD;  Location: [facility] ORS;  Service: Urology;  Laterality: Right;  . URETEROSCOPY WITH HOLMIUM LASER LITHOTRIPSY Right 01/07/2016   Procedure: URETEROSCOPY WITH HOLMIUM LASER LITHOTRIPSY;  Surgeon: Hollice Espy, MD;  Location: [facility] ORS;  Service: Urology;  Laterality: Right;    Family History  Problem Relation Age of Onset  . Breast cancer Mother 15       deceased 37  . Breast cancer Maternal Aunt 68       deceased 27s  . Prostate cancer Father        deceased 21  . Colon cancer Paternal Aunt 80       deceased 41s   Social History:  reports that  has never smoked. she has never used smokeless tobacco. She reports that she does not drink alcohol or use drugs.  Allergies:  Allergies  Allergen Reactions  . Sulfa Antibiotics Other (See Comments)    Other reaction(s): Kidney Disorder    Medications Prior to Admission  Medication Sig Dispense Refill  . acetaminophen (TYLENOL) 500 MG tablet Take 500 mg by mouth every 6 (six) hours as needed (for pain.).    Marland Kitchen anastrozole (ARIMIDEX) 1 MG tablet Take 1 tablet (1 mg total) by mouth daily. 90 tablet 6  . aspirin EC 81 MG EC tablet Take 1 tablet (81 mg total) by mouth daily. 30 tablet 2  . Camphor-Menthol-Capsicum (TIGER BALM PAIN RELIEVING) 80-24-16 MG [facility] Place 1 patch onto the skin  daily as needed (for knee pain.).    Marland Kitchen furosemide (LASIX) 40 MG tablet Take 40 mg by mouth daily.    Marland Kitchen labetalol (NORMODYNE) 200 MG tablet TAKE 1 TABLET (200MG TOTAL) BY MOUTH 2 (TWO) TIMES DAILY.    Marland Kitchen levothyroxine (SYNTHROID) 88 MCG tablet Take 88 mcg by mouth daily before breakfast.     . losartan-hydrochlorothiazide (HYZAAR) 100-25 MG tablet Take 1 tablet by mouth daily.    Marland Kitchen octreotide (SANDOSTATIN LAR) 30 MG injection Inject 30 mg into the muscle every 28 (twenty-eight) days.     . potassium chloride SA (K-DUR,KLOR-CON) 20 MEQ tablet Take 2 tablets (40 mEq total) by mouth 2 (two) times daily. 120 tablet 5  . Vitamin D, Ergocalciferol, (DRISDOL)  50000 units CAPS capsule TAKE 1 CAPSULE (50,000 UNITS TOTAL) BY MOUTH ONCE A WEEK. 24 capsule 1    Results for orders placed or performed during the hospital encounter of 04/05/17 (from the past 48 hour(s))  CBC     Status: Abnormal   Collection Time: 04/05/17  3:10 AM  Result Value Ref Range   WBC 9.5 3.6 - 11.0 K/uL   RBC 4.55 3.80 - 5.20 MIL/uL   Hemoglobin 13.2 12.0 - 16.0 g/dL   HCT 39.9 35.0 - 47.0 %   MCV 87.8 80.0 - 100.0 fL   MCH 29.0 26.0 - 34.0 pg   MCHC 33.1 32.0 - 36.0 g/dL   RDW 14.8 (H) 11.5 - 14.5 %   Platelets 194 150 - 440 K/uL    Comment: Performed at [facility], [address] [place], [place] [address]  Basic metabolic panel     Status: Abnormal   Collection Time: 04/05/17  3:10 AM  Result Value Ref Range   Sodium 139 135 - 145 mmol/L   Potassium 4.1 3.5 - 5.1 mmol/L    Comment: HEMOLYSIS AT THIS LEVEL MAY AFFECT RESULT   Chloride 101 101 - 111 mmol/L   CO2 26 22 - 32 mmol/L   Glucose, Bld 137 (H) 65 - 99 mg/dL   BUN 26 (H) 6 - 20 mg/dL   Creatinine, Ser 0.87 0.44 - 1.00 mg/dL   Calcium 9.8 8.9 - 10.3 mg/dL   GFR calc non Af Amer >60 >60 mL/min   GFR calc Af Amer >60 >60 mL/min    Comment: (NOTE) The eGFR has been calculated using the CKD EPI equation. This calculation has not been validated in all clinical situations. eGFR's persistently <60 mL/min signify possible Chronic Kidney Disease.    Anion gap 12 5 - 15    Comment: Performed at [facility], [address] [place], [place] [address]  TSH     Status: None   Collection Time: 04/05/17  3:10 AM  Result Value Ref Range   TSH 3.356 0.350 - 4.500 uIU/mL    Comment: Performed by a 3rd Generation assay with a functional sensitivity of <=0.01 uIU/mL. Performed at [facility], [address] [place], [place] [address]   Type and screen [place]     Status: None   Collection Time: 04/05/17  3:12 AM  Result Value Ref Range   ABO/RH(D) O POS     Antibody Screen NEG    Sample Expiration      04/08/2017 Performed at [facility], [address] [place], [place] [address]    Dg Tibia/fibula Right  Result Date: 04/05/2017 CLINICAL DATA:  Leg pain EXAM: RIGHT TIBIA AND FIBULA - 2 VIEW  COMPARISON:  None. 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 Possible mildly impacted fracture at the fibular neck. Acute slightly comminuted fracture at the proximal shaft of the tibia with less than 1/4 bone with of posterior displacement of distal fracture fragment. Diffuse soft tissue edema. Moderate plantar calcaneal spur. Possible avulsion adjacent to the lateral talus. IMPRESSION: 1. Acute mildly comminuted and displaced fracture of the proximal shaft of the tibia 2. Possible nondisplaced minimally impacted fracture of the fibular neck. 3. Possible cortical avulsion inferolateral talus, if tender here could obtain dedicated ankle radiographs. Electronically Signed   By: Donavan Foil M.D.   On: 04/05/2017 03:51    Review of Systems  Constitutional: Negative for chills and fever.  HENT: Negative for sore throat and tinnitus.   Eyes: Negative for blurred vision and redness.  Respiratory: Negative for cough and shortness of breath.   Cardiovascular: Negative for chest pain, palpitations, orthopnea and PND.  Gastrointestinal: Negative for abdominal pain, diarrhea, nausea and vomiting.  Genitourinary: Negative for dysuria, frequency and urgency.  Musculoskeletal: Positive for falls. Negative for joint pain and myalgias.  Skin: Negative for rash.       No lesions  Neurological: Negative for speech change, focal weakness and weakness.  Endo/Heme/Allergies: Does not bruise/bleed easily.       No temperature intolerance  Psychiatric/Behavioral: Negative for depression and suicidal ideas.    Blood pressure (!) 168/72, pulse 67, temperature 98.2 F (36.8 C), temperature source Oral, resp. rate 10, height _0  (1.676 m), weight 95.3 kg (210  lb), SpO2 93 %. Physical Exam  Vitals reviewed. Constitutional: She is oriented to person, place, and time. She appears well-developed and well-nourished. No distress.  HENT:  Head: Normocephalic and atraumatic.  Mouth/Throat: Oropharynx is clear and moist.  Eyes: Conjunctivae and EOM are normal. Pupils are equal, round, and reactive to light. No scleral icterus.  Neck: Normal range of motion. Neck supple. No JVD present. No tracheal deviation present. No thyromegaly present.  Cardiovascular: Normal rate, regular rhythm and normal heart sounds. Exam reveals no gallop and no friction rub.  No murmur heard. Respiratory: Effort normal and breath sounds normal. No respiratory distress. She has no wheezes.  GI: Soft. Bowel sounds are normal. She exhibits no distension. There is no tenderness.  Genitourinary:  Genitourinary Comments: Deferred  Musculoskeletal: Normal range of motion. She exhibits tenderness. She exhibits no edema or deformity.  Lymphadenopathy:    She has no cervical adenopathy.  Neurological: She is alert and oriented to person, place, and time. No cranial nerve deficit. She exhibits normal muscle tone.  Skin: Skin is warm and dry. No rash noted. No erythema.  Psychiatric: She has a normal mood and affect. Her behavior is normal. Judgment and thought content normal.     Assessment/Plan This is a 77 year old female admitted for leg fracture. 1.  Leg fracture: Tibiofibular of the right leg.  The patient has been splinted.  Orthopedic cast.  They do not plan for surgery at this time.  Manage pain. 2.  CHF: Diastolic; chronic.  Continue Lasix per home regimen.   3.  Hypothyroidism: Check TSH; continue Synthroid 4.  History of breast cancer: Continue Arimidex 5.  Carcinoid syndrome: The patient still takes octreotide on a monthly basis 6.  Hypertension: Uncontrolled (likely due to pain).  Continue hydrochlorothiazide, labetalol and losartan. 7.  Vitamin D deficiency: Continue to  replete vitamin D monthly 8.  DVT prophylaxis: Early ambulation 9.  GI prophylaxis: None The patient is a full  code.  Time spent on admission orders and patient care approximately 45 minutes  Harrie Foreman, MD 04/05/2017, 7:36 AM

## 2017-04-05 NOTE — ED Triage Notes (Addendum)
Pt arrived via EMS from home with complaints of right lower leg pain. Pt reports that she stood up and heard two pops. Pt denies falling. Pt is alert and oriented x 4. VS per EMS BP-141/81 HR-68. Pt has allergies to Sulfur. Pt has Hx of of HTN CHF, and breast cancer on left side.

## 2017-04-05 NOTE — Consult Note (Signed)
ORTHOPAEDIC CONSULTATION  REQUESTING PHYSICIAN: Nicholes Mango, MD  Chief Complaint: Right leg pain  HPI: Emma [name] is a 77 y.o. female who complains of right leg pain after a fall at home last night.  The patient was changing a light bulb and her right leg gave way.  She had been having some pain in the leg for a while a week or 2.  She has had breast cancer and carcinoid syndrome.  Exam and x-rays in the emergency room revealed a essentially nondisplaced fracture of the upper third of the right tibia and compression of the fibular neck.  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  Discussed cast treatment versus surgical treatment of this with the patient.  She would prefer not to have surgery at this point.  We will plan on placing her in a long-leg cast tomorrow.  She does live alone and will need to go to a rehabilitation facility.  We may have to rethink this and place a rod in there in the next couple weeks if she is not doing well.  We will use enteric-coated aspirin twice a day for anticoagulation.  Past Medical History:  Diagnosis Date  . Aromatase inhibitor use   . Breast cancer ([place]) 2014   left breast cancer/radiation  . Breast cancer, left breast ([place]) 06/19/2014  . CHF (congestive heart failure) (HCC)    recent sepsis  . Chronic kidney disease   . Dizziness   . Dyspnea    recently while admitted  . GERD (gastroesophageal reflux disease)   . Hearing loss   . History of hiatal hernia   . History of kidney stones   . HOH (hard of hearing)    severe  . Hypertension   . Hypothyroidism   . Leg DVT (deep venous thromboembolism), acute ([place]) 07/2015   left leg after fracture  . 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 Pain    chest pain while admitted  . Thyroid disease    Past Surgical History:  Procedure Laterality Date  . ABDOMINAL HYSTERECTOMY     partial  . BREAST EXCISIONAL BIOPSY Left 12/2012   +  . BREAST LUMPECTOMY Left 2014  . BREAST LUMPECTOMY  WITH SENTINEL LYMPH NODE BIOPSY Left 2014  . CHOLECYSTECTOMY    . CYSTOSCOPY W/ URETERAL STENT PLACEMENT Right 01/07/2016   Procedure: CYSTOSCOPY WITH STENT REPLACEMENT;  Surgeon: Hollice Espy, MD;  Location: [facility] ORS;  Service: Urology;  Laterality: Right;  . CYSTOSCOPY WITH RETROGRADE PYELOGRAM, URETEROSCOPY AND STENT PLACEMENT Right 12/19/2015   Procedure: CYSTOSCOPY WITH RETROGRADE PYELOGRAM, URETEROSCOPY AND STENT PLACEMENT;  Surgeon: Ardis Hughs, MD;  Location: [facility] ORS;  Service: Urology;  Laterality: Right;  . URETEROSCOPY WITH HOLMIUM LASER LITHOTRIPSY Right 01/07/2016   Procedure: URETEROSCOPY WITH HOLMIUM LASER LITHOTRIPSY;  Surgeon: Hollice Espy, MD;  Location: [facility] ORS;  Service: Urology;  Laterality: Right;   Social History   Socioeconomic History  . Marital status: Widowed    Spouse name: None  . Number of children: None  . Years of education: None  . Highest education level: None  Social Needs  . Financial resource strain: None  . Food insecurity - worry: None  . Food insecurity - inability: None  . Transportation needs - medical: None  . Transportation needs - non-medical: None  Occupational History  . None  Tobacco Use  . Smoking status: Never Smoker  . Smokeless tobacco: Never Used  Substance and Sexual Activity  . Alcohol use: No  Alcohol/week: 0.0 oz  . Drug use: No  . Sexual activity: None  Other Topics Concern  . None  Social History Narrative  . None   Family History  Problem Relation Age of Onset  . Breast cancer Mother 18       deceased 16  . Breast cancer Maternal Aunt 68       deceased 25s  . Prostate cancer Father        deceased 84  . Colon cancer Paternal Aunt 59       deceased 110s   Allergies  Allergen Reactions  . Sulfa Antibiotics Other (See Comments)    Other reaction(s): Kidney Disorder   Prior to Admission medications   Medication Sig Start Date End Date Taking? Authorizing Provider  acetaminophen (TYLENOL) 500  MG tablet Take 500 mg by mouth every 6 (six) hours as needed (for pain.).   Yes [provider]  anastrozole (ARIMIDEX) 1 MG tablet Take 1 tablet (1 mg total) by mouth daily. 02/06/17  Yes [name], MD  aspirin EC 81 MG EC tablet Take 1 tablet (81 mg total) by mouth daily. 12/27/15  Yes Demetrios Loll, MD  Camphor-Menthol-Capsicum (TIGER BALM PAIN RELIEVING) 80-24-16 MG [facility] 1 patch onto the skin daily as needed (for knee pain.).   Yes [provider]  furosemide (LASIX) 40 MG tablet Take 40 mg by mouth daily.   Yes [provider]  labetalol (NORMODYNE) 200 MG tablet TAKE 1 TABLET (200MG  TOTAL) BY MOUTH 2 (TWO) TIMES DAILY. 08/09/14  Yes [provider]  levothyroxine (SYNTHROID) 88 MCG tablet Take 88 mcg by mouth daily before breakfast.  06/20/14  Yes [provider]  losartan-hydrochlorothiazide (HYZAAR) 100-25 MG tablet Take 1 tablet by mouth daily.   Yes [provider]  octreotide (SANDOSTATIN LAR) 30 MG injection Inject 30 mg into the muscle every 28 (twenty-eight) days.    Yes [provider]  potassium chloride SA (K-DUR,KLOR-CON) 20 MEQ tablet Take 2 tablets (40 mEq total) by mouth 2 (two) times daily. 09/19/16  Yes Hackney, [name] A, FNP  Vitamin D, Ergocalciferol, (DRISDOL) 50000 units CAPS capsule TAKE 1 CAPSULE (50,000 UNITS TOTAL) BY MOUTH ONCE A WEEK. 09/30/16  Yes [name], MD   Dg Tibia/fibula Right  Result Date: 04/05/2017 CLINICAL DATA:  Leg pain EXAM: RIGHT TIBIA AND FIBULA - 2 VIEW COMPARISON:  None. 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 Possible mildly impacted fracture at the fibular neck. Acute slightly comminuted fracture at the proximal shaft of the tibia with less than 1/4 bone with of posterior displacement of distal fracture fragment. Diffuse soft tissue edema. Moderate plantar calcaneal spur. Possible avulsion adjacent to the lateral talus. IMPRESSION: 1. Acute mildly comminuted and  displaced fracture of the proximal shaft of the tibia 2. Possible nondisplaced minimally impacted fracture of the fibular neck. 3. Possible cortical avulsion inferolateral talus, if tender here could obtain dedicated ankle radiographs. Electronically Signed   By: Donavan Foil M.D.   On: 04/05/2017 03:51    Positive ROS: All other systems have been reviewed and were otherwise negative with the exception of those mentioned in the HPI and as above.  Physical Exam: General: Alert, no acute distress Cardiovascular: No pedal edema Respiratory: No cyanosis, no use of accessory musculature GI: No organomegaly, abdomen is soft and non-tender Skin: No lesions in the area of chief complaint Neurologic: Sensation intact distally Psychiatric: Patient is competent for consent with normal mood and affect Lymphatic: No  axillary or cervical lymphadenopathy  MUSCULOSKELETAL: Patient alert and cooperative.  Right leg is splinted.  She is tender over the proximal tibia.  There is mild swelling.  Neurovascular status good distally.  No other injuries are noted or complained of.  Assessment: Minimally displaced transverse fracture proximal third right tibia and fibula neck. This may be a stress fracture.  There is no evidence on x-ray for tumor.  Plan: We will start with conservative treatment including a long leg cast. He will need rehabilitation services. Plan on enteric-coated aspirin twice a day while in the cast. We will see her back in our office in 2 weeks.    Park Breed, MD [phone]   04/05/2017 1:22 PM

## 2017-04-05 NOTE — ED Notes (Signed)
Emma [name] (daughter) [phone]

## 2017-04-05 NOTE — Progress Notes (Signed)
[place] [facility] [place] NAME: Emma [name]    MR#:  [id number]  DATE OF BIRTH:  [birth year]  SUBJECTIVE:  CHIEF COMPLAINT:   Patient is reporting right leg pain, hard of hearing REVIEW OF SYSTEMS:  CONSTITUTIONAL: No fever, fatigue or weakness.  EYES: No blurred or double vision.  EARS, NOSE, AND THROAT: No tinnitus or ear pain.  RESPIRATORY: No cough, shortness of breath, wheezing or hemoptysis.  CARDIOVASCULAR: No chest pain, orthopnea, edema.  GASTROINTESTINAL: No nausea, vomiting, diarrhea or abdominal pain.  GENITOURINARY: No dysuria, hematuria.  ENDOCRINE: No polyuria, nocturia,  HEMATOLOGY: No anemia, easy bruising or bleeding SKIN: No rash or lesion. MUSCULOSKELETAL: Right leg pain  nEUROLOGIC: No tingling, numbness, weakness.  PSYCHIATRY: No anxiety or depression.   DRUG ALLERGIES:   Allergies  Allergen Reactions  . Sulfa Antibiotics Other (See Comments)    Other reaction(s): Kidney Disorder    VITALS:  Blood pressure (!) 167/61, pulse 65, temperature 98.4 F (36.9 C), temperature source Oral, resp. rate 18, height 5\' 6"  (1.676 m), weight 95.3 kg (210 lb), SpO2 99 %.  PHYSICAL EXAMINATION:  GENERAL:  77 y.o.-year-old patient lying in the bed with no acute distress.  EYES: Pupils equal, round, reactive to light and accommodation. No scleral icterus. Extraocular muscles intact.  HEENT: Head atraumatic, normocephalic. Oropharynx and nasopharynx clear.  NECK:  Supple, no jugular venous distention. No thyroid enlargement, no tenderness.  LUNGS: Normal breath sounds bilaterally, no wheezing, rales,rhonchi or crepitation. No use of accessory muscles of respiration.  CARDIOVASCULAR: S1, S2 normal. No murmurs, rubs, or gallops.  ABDOMEN: Soft, nontender, nondistended. Bowel sounds present. No organomegaly or mass.  EXTREMITIES: Right leg casted, tender ,no pedal edema, cyanosis, or clubbing.  NEUROLOGIC: Cranial nerves II  through XII are intact. Muscle strength 5/5 in all extremities. Sensation intact. Gait not checked.  PSYCHIATRIC: The patient is alert and oriented x 3.  SKIN: No obvious rash, lesion, or ulcer.    LABORATORY PANEL:   CBC Recent Labs  Lab 04/05/17 0310  WBC 9.5  HGB 13.2  HCT 39.9  PLT 194   ------------------------------------------------------------------------------------------------------------------  Chemistries  Recent Labs  Lab 04/05/17 0310  NA 139  K 4.1  CL 101  CO2 26  GLUCOSE 137*  BUN 26*  CREATININE 0.87  CALCIUM 9.8   ------------------------------------------------------------------------------------------------------------------  Cardiac Enzymes No results for input(s): TROPONINI in the last 168 hours. ------------------------------------------------------------------------------------------------------------------  RADIOLOGY:  Dg Tibia/fibula Right  Result Date: 04/05/2017 CLINICAL DATA:  Leg pain EXAM: RIGHT TIBIA AND FIBULA - 2 VIEW COMPARISON:  None. 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 Possible mildly impacted fracture at the fibular neck. Acute slightly comminuted fracture at the proximal shaft of the tibia with less than 1/4 bone with of posterior displacement of distal fracture fragment. Diffuse soft tissue edema. Moderate plantar calcaneal spur. Possible avulsion adjacent to the lateral talus. IMPRESSION: 1. Acute mildly comminuted and displaced fracture of the proximal shaft of the tibia 2. Possible nondisplaced minimally impacted fracture of the fibular neck. 3. Possible cortical avulsion inferolateral talus, if tender here could obtain dedicated ankle radiographs. Electronically Signed   By: Donavan Foil M.D.   On: 04/05/2017 03:51    EKG:   Orders placed or performed during the hospital encounter of 02/07/16  . ED EKG  . ED EKG  . EKG 12-Lead  . EKG 12-Lead    ASSESSMENT AND PLAN:   This is a 77 year old female admitted for leg  fracture. 1.  Rt Leg fracture: Tibiofibular of the right leg.  Could be stress fracture The patient has been splinted. Dr. Sabra Heck is planning to do a long leg cast tomorrow Pain management as needed They do not plan for surgery at this time.    2.  CHF: Diastolic; chronic.  Continue Lasix per home regimen.    3.  Hypothyroidism: Normal TSH; continue Synthroid  4.  History of breast cancer: Continue Arimidex outpatient follow-up with oncology as recommended  5.  Carcinoid syndrome: The patient still takes octreotide on a monthly basis  6.  Hypertension: Uncontrolled (likely due to pain).  Continue hydrochlorothiazide, labetalol and losartan.  7.  Vitamin D deficiency: Continue to replete vitamin D monthly  8.  DVT prophylaxis: Early ambulation 9.  GI prophylaxis: None       All the records are reviewed and case discussed with Care Management/Social Workerr. Management plans discussed with the patient, family and they are in agreement.  CODE STATUS: fc  TOTAL TIME TAKING CARE OF THIS PATIENT: 36 minutes.   POSSIBLE D/C IN 2-3 DAYS, DEPENDING ON CLINICAL CONDITION.  Note: This dictation was prepared with Dragon dictation along with smaller phrase technology. Any transcriptional errors that result from this process are unintentional.   Nicholes Mango M.D on 04/05/2017 at 3:27 PM  Between 7am to 6pm - Pager - [phone] After 6pm go to [email or web address] - password [email or web address] [facility]  [place] [facility]  Office  [phone]  CC: Primary care physician; [name], MD

## 2017-04-05 NOTE — Progress Notes (Signed)
PHARMACIST - PHYSICIAN ORDER COMMUNICATION  CONCERNING: P&T Medication Policy on Herbal Medications  DESCRIPTION:  This patient's order for:  Camphor-Menthol-Capsicum 80-24-16   has been noted.  This product(s) is classified as an "herbal" or natural product. Due to a lack of definitive safety studies or FDA approval, nonstandard manufacturing practices, plus the potential risk of unknown drug-drug interactions while on inpatient medications, the Pharmacy and Therapeutics Committee does not permit the use of "herbal" or natural products of this type within [facility]   ACTION TAKEN: The pharmacy department is unable to verify this order at this time. Please reevaluate patient's clinical condition at discharge and address if the herbal or natural product(s) should be resumed at that time.

## 2017-04-06 MED ORDER — MUPIROCIN 2 % EX OINT
1.0000 "application " | TOPICAL_OINTMENT | Freq: Two times a day (BID) | CUTANEOUS | Status: DC
  Administered 2017-04-06 – 2017-04-07 (×3): 1 via NASAL
  Filled 2017-04-06: qty 22

## 2017-04-06 MED ORDER — CHLORHEXIDINE GLUCONATE CLOTH 2 % EX PADS
6.0000 | MEDICATED_PAD | Freq: Every day | CUTANEOUS | Status: DC
  Administered 2017-04-06: 6 via TOPICAL

## 2017-04-06 NOTE — Care Management Note (Signed)
Case Management Note  Patient Details  Name: Emma [name] MRN: [id number] Date of Birth: 05-14-1940  Subjective/Objective:  Right Tib/Fib fracture. Patient to have long cast placed by Dr. Sabra Heck  today. At discharge patient will be going to live with her daughter, Emma [name] @ [address] [place], [place] [address] Work # [phone] Cell # [phone] Daughter will be her caregiver at night and her son in law will assist her during the day. Patient has a walker, wheelchair and bsc.  Offered choice of home health. Referral to [facility] for HHPT and HHA. PCP is Dr. Kary Kos.                   Action/Plan: [facility] for HHPT and HHA.   Expected Discharge Date:  04/08/17               Expected Discharge Plan:  [place]  In-House Referral:     Discharge planning Services  CM Consult  Post Acute Care Choice:  Home Health Choice offered to:  Adult Children  DME Arranged:    DME Agency:     HH Arranged:  PT, Nurse's Aide [place] Agency:  [place]  Status of Service:  In process, will continue to follow  If discussed at Long Length of Stay Meetings, dates discussed:    Additional Comments:  Jolly Mango, RN 04/06/2017, 12:02 PM

## 2017-04-06 NOTE — Clinical Social Work Note (Signed)
Clinical Social Work Assessment  Patient Details  Name: Emma [name] MRN: [id number] Date of Birth: Nov 17, 1940  Date of referral:  04/06/17               Reason for consult:  Discharge Planning                Permission sought to share information with:    Permission granted to share information::     Name::        Agency::     Relationship::     Contact Information:     Housing/Transportation Living arrangements for the past 2 months:  Single Family Home Source of Information:  Patient Patient Interpreter Needed:  None Criminal Activity/Legal Involvement Pertinent to Current Situation/Hospitalization:  No - Comment as needed Significant Relationships:  Adult Children Lives with:  Self Do you feel safe going back to the place where you live?  Yes Need for family participation in patient care:     Care giving concerns:  Patient lives alone in [place] however the plan is for patient to D/C home to her daughter [place] in [place], [place].    Social Worker assessment / plan:  Holiday representative ([place]) reviewed chart and noted that patient has a tib/fib fracture and PT is pending. CSW met with patient alone at bedside to discuss D/C plan. Patient was alert and oriented X4 however she was hard of hearing. Patient reported that she has hearing aides however she left them at home. CSW explained that PT will evaluate patient and make a recommendation of SNF or home health. CSW explained SNF process and that medicare requires a 3 night inpatient qualifying stay in a hospital in order to pay for SNF. Patient was admitted to inpatient on 04/05/17. Patient reported that she does not want to go to SNF and her daughter Emma [name] wants her to come home to her house. Patient is agreeable to home health. RN case manager contacted Emma [name] and confirmed this plan. CSW will continue to follow and assist as needed.   Employment status:  Retired Forensic scientist:  Medicare PT Recommendations:  Not  assessed at this time [place] / Referral to community resources:  Other (Comment Required)(Patient is going home with her daughter Emma [name]. )  Patient/Family's Response to care:  Patient reported that she prefers to to home to her daughter Emma [name]'s house.   Patient/Family's Understanding of and Emotional Response to Diagnosis, Current Treatment, and Prognosis:  Patient was very pleasant and thanked CSW for visit.   Emotional Assessment Appearance:  Appears stated age Attitude/Demeanor/Rapport:    Affect (typically observed):  Accepting, Adaptable, Pleasant Orientation:  Oriented to Self, Oriented to Place, Oriented to  Time, Oriented to Situation Alcohol / Substance use:  Not Applicable Psych involvement (Current and /or in the community):  No (Comment)  Discharge Needs  Concerns to be addressed:  Discharge Planning Concerns Readmission within the last 30 days:  No Current discharge risk:  Dependent with Mobility Barriers to Discharge:  Continued Medical Work up   [employer], Veronia Beets, LCSW 04/06/2017, 3:10 PM

## 2017-04-06 NOTE — NC FL2 (Addendum)
[place] LEVEL OF CARE SCREENING TOOL     IDENTIFICATION  Patient Name: Emma [name] Birthdate: 06/25/1940 Sex: female Admission Date (Current Location): 04/05/2017  [place] and [place] Number:  Engineering geologist and Address:  [facility], [address], [place], [place] [address]      Provider Number: [id number]  Attending Physician Name and Address:  Nicholes Mango, MD  Relative Name and Phone Number:       Current Level of Care: Hospital Recommended Level of Care: [place] Prior Approval Number:    Date Approved/Denied:   PASRR Number: [id number])  Discharge Plan: SNF    Current Diagnoses: Patient Active Problem List   Diagnosis Date Noted  . Tibia/fibula fracture 04/05/2017  . Liver mass, right lobe 03/28/2016  . Chronic diastolic heart failure ([place]) 02/06/2016  . UTI (urinary tract infection) 01/25/2016  . Sepsis ([place]) 12/19/2015  . 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 Fall 08/25/2015  . Anxiety 08/25/2015  . GERD (gastroesophageal reflux disease) 08/25/2015  . HTN (hypertension) 08/25/2015  . Hypothyroidism 08/25/2015  . Iron deficiency anemia due to chronic blood loss 06/19/2014  . 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    Orientation RESPIRATION BLADDER Height & Weight     Self, Time, Situation, Place  Normal Continent Weight: 215 lb 8 oz (97.8 kg) Height:  5\' 6"  (167.6 cm)  BEHAVIORAL SYMPTOMS/MOOD NEUROLOGICAL BOWEL NUTRITION STATUS      Continent Diet(Diet: Regular )  AMBULATORY STATUS COMMUNICATION OF NEEDS Skin   Extensive Assist Verbally Normal                       Personal Care Assistance Level of Assistance  Bathing, Feeding, Dressing Bathing Assistance: Limited assistance Feeding assistance: Independent Dressing Assistance: Limited assistance     Functional Limitations Info  Sight, Hearing, Speech Sight Info: Adequate Hearing Info: Adequate Speech  Info: Adequate    SPECIAL CARE FACTORS FREQUENCY  PT (By licensed PT), OT (By licensed OT)     PT Frequency: (5) OT Frequency: (5)            Contractures      Additional Factors Info  Code Status, Allergies Code Status Info: (Full Code. ) Allergies Info: (Sulfa Antibiotics)           Current Medications (04/06/2017):  This is the current hospital active medication list Current Facility-Administered Medications  Medication Dose Route Frequency Provider Last Rate Last Dose  . acetaminophen (TYLENOL) tablet 650 mg  650 mg Oral Q6H PRN Harrie Foreman, MD   650 mg at 04/06/17 0501   Or  . acetaminophen (TYLENOL) suppository 650 mg  650 mg Rectal Q6H PRN Harrie Foreman, MD      . anastrozole (ARIMIDEX) tablet 1 mg  1 mg Oral Daily Harrie Foreman, MD   1 mg at 04/06/17 0973  . aspirin EC tablet 81 mg  81 mg Oral Daily Harrie Foreman, MD   81 mg at 04/06/17 0954  . Chlorhexidine Gluconate Cloth 2 % PADS 6 each  6 each Topical Daily Earnestine Leys, MD      . docusate sodium (COLACE) capsule 100 mg  100 mg Oral BID Harrie Foreman, MD   100 mg at 04/05/17 1228  . furosemide (LASIX) tablet 40 mg  40 mg Oral Daily Harrie Foreman, MD   40 mg at 04/06/17 0953  . losartan (COZAAR) tablet 100 mg  100 mg Oral Daily Harrie Foreman, MD   100 mg at 04/06/17 7972   And  . hydrochlorothiazide (HYDRODIURIL) tablet 25 mg  25 mg Oral Daily Harrie Foreman, MD   25 mg at 04/06/17 0954  . labetalol (NORMODYNE) tablet 200 mg  200 mg Oral BID Harrie Foreman, MD   200 mg at 04/06/17 8206  . levothyroxine (SYNTHROID, LEVOTHROID) tablet 88 mcg  88 mcg Oral QAC breakfast Harrie Foreman, MD   88 mcg at 04/06/17 0953  . morphine 2 MG/ML injection 2 mg  2 mg Intravenous Q4H PRN Harrie Foreman, MD      . mupirocin ointment (BACTROBAN) 2 % 1 application  1 application Nasal BID Earnestine Leys, MD      . ondansetron [facility]) tablet 4 mg  4 mg Oral Q6H PRN Harrie Foreman, MD       Or  . ondansetron [facility]) injection 4 mg  4 mg Intravenous Q6H PRN Harrie Foreman, MD      . oxyCODONE-acetaminophen (PERCOCET/ROXICET) 5-325 MG per tablet 1 tablet  1 tablet Oral Q6H PRN Harrie Foreman, MD   1 tablet at 04/06/17 [phone]  . potassium chloride SA (K-DUR,KLOR-CON) CR tablet 40 mEq  40 mEq Oral BID Harrie Foreman, MD   40 mEq at 04/06/17 0953  . Vitamin D (Ergocalciferol) (DRISDOL) capsule 50,000 Units  50,000 Units Oral Weekly Harrie Foreman, MD   50,000 Units at 04/05/17 1229     Discharge Medications: Please see discharge summary for a list of discharge medications.  Relevant Imaging Results:  Relevant Lab Results:   Additional Information (SSN: [id number])  [employer], Veronia Beets, LCSW

## 2017-04-06 NOTE — Progress Notes (Signed)
Subjective: Leg feels more stable in a long leg cast.  Willing to start PT.       Patient reports pain as mild.  Objective:   VITALS:   Vitals:   04/06/17 0802 04/06/17 1602  BP: (!) 123/47 (!) 134/46  Pulse: 69 62  Resp: 16   Temp: 99 F (37.2 C) 97.6 F (36.4 C)  SpO2: 92% 97%    Neurologically intact ABD soft Neurovascular intact Sensation intact distally Intact pulses distally Dorsiflexion/Plantar flexion intact  LABS Recent Labs    04/05/17 0310  HGB 13.2  HCT 39.9  WBC 9.5  PLT 194    Recent Labs    04/05/17 0310  NA 139  K 4.1  BUN 26*  CREATININE 0.87  GLUCOSE 137*    No results for input(s): LABPT, INR in the last 72 hours.   Assessment/Plan:      Up with therapy Discharge to SNF   Enteric-coated aspirin 325 mg twice daily   Return to clinic 2 weeks

## 2017-04-06 NOTE — Progress Notes (Signed)
[facility] [place] NAME: Emma [name]    MR#:  [id number]  DATE OF BIRTH:  05-07-1940  SUBJECTIVE:  CHIEF COMPLAINT:   Patient is reporting right leg pain, hard of hearing, for leg cast today REVIEW OF SYSTEMS:  CONSTITUTIONAL: No fever, fatigue or weakness.  EYES: No blurred or double vision.  EARS, NOSE, AND THROAT: No tinnitus or ear pain.  RESPIRATORY: No cough, shortness of breath, wheezing or hemoptysis.  CARDIOVASCULAR: No chest pain, orthopnea, edema.  GASTROINTESTINAL: No nausea, vomiting, diarrhea or abdominal pain.  GENITOURINARY: No dysuria, hematuria.  ENDOCRINE: No polyuria, nocturia,  HEMATOLOGY: No anemia, easy bruising or bleeding SKIN: No rash or lesion. MUSCULOSKELETAL: Right leg pain  nEUROLOGIC: No tingling, numbness, weakness.  PSYCHIATRY: No anxiety or depression.   DRUG ALLERGIES:   Allergies  Allergen Reactions  . Sulfa Antibiotics Other (See Comments)    Other reaction(s): Kidney Disorder    VITALS:  Blood pressure (!) 134/46, pulse 62, temperature 97.6 F (36.4 C), temperature source Oral, resp. rate 16, height 5\' 6"  (1.676 m), weight 97.8 kg (215 lb 8 oz), SpO2 97 %.  PHYSICAL EXAMINATION:  GENERAL:  77 y.o.-year-old patient lying in the bed with no acute distress.  EYES: Pupils equal, round, reactive to light and accommodation. No scleral icterus. Extraocular muscles intact.  HEENT: Head atraumatic, normocephalic. Oropharynx and nasopharynx clear.  NECK:  Supple, no jugular venous distention. No thyroid enlargement, no tenderness.  LUNGS: Normal breath sounds bilaterally, no wheezing, rales,rhonchi or crepitation. No use of accessory muscles of respiration.  CARDIOVASCULAR: S1, S2 normal. No murmurs, rubs, or gallops.  ABDOMEN: Soft, nontender, nondistended. Bowel sounds present. No organomegaly or mass.  EXTREMITIES: Right leg casted, tender ,no pedal edema, cyanosis, or clubbing.   NEUROLOGIC: Cranial nerves II through XII are intact. Muscle strength 5/5 in all extremities. Sensation intact. Gait not checked.  PSYCHIATRIC: The patient is alert and oriented x 3.  SKIN: No obvious rash, lesion, or ulcer.    LABORATORY PANEL:   CBC Recent Labs  Lab 04/05/17 0310  WBC 9.5  HGB 13.2  HCT 39.9  PLT 194   ------------------------------------------------------------------------------------------------------------------  Chemistries  Recent Labs  Lab 04/05/17 0310  NA 139  K 4.1  CL 101  CO2 26  GLUCOSE 137*  BUN 26*  CREATININE 0.87  CALCIUM 9.8   ------------------------------------------------------------------------------------------------------------------  Cardiac Enzymes No results for input(s): TROPONINI in the last 168 hours. ------------------------------------------------------------------------------------------------------------------  RADIOLOGY:  Dg Tibia/fibula Right  Result Date: 04/05/2017 CLINICAL DATA:  Leg pain EXAM: RIGHT TIBIA AND FIBULA - 2 VIEW COMPARISON:  None. 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 Possible mildly impacted fracture at the fibular neck. Acute slightly comminuted fracture at the proximal shaft of the tibia with less than 1/4 bone with of posterior displacement of distal fracture fragment. Diffuse soft tissue edema. Moderate plantar calcaneal spur. Possible avulsion adjacent to the lateral talus. IMPRESSION: 1. Acute mildly comminuted and displaced fracture of the proximal shaft of the tibia 2. Possible nondisplaced minimally impacted fracture of the fibular neck. 3. Possible cortical avulsion inferolateral talus, if tender here could obtain dedicated ankle radiographs. Electronically Signed   By: Donavan Foil M.D.   On: 04/05/2017 03:51    EKG:   Orders placed or performed during the hospital encounter of 02/07/16  . ED EKG  . ED EKG  . EKG 12-Lead  . EKG 12-Lead    ASSESSMENT AND PLAN:   This is a  77 year old female admitted for leg fracture. 1.  Rt Leg fracture: Tibiofibular of the right leg.  Could be stress fracture The patient has been splinted. Dr. Sabra Heck is planning to do a long leg cast today, PT evaluation after the cast placement Outpatient follow-up with orthopedics after discharge Pain management as needed They do not plan for surgery at this time.    2.  CHF: Diastolic; chronic.  Continue Lasix per home regimen.    3.  Hypothyroidism: Normal TSH; continue Synthroid  4.  History of breast cancer: Continue Arimidex outpatient follow-up with oncology as recommended  5.  Carcinoid syndrome: The patient still takes octreotide on a monthly basis  6.  Hypertension: Uncontrolled (likely due to pain).  Continue hydrochlorothiazide, labetalol and losartan.  7.  Vitamin D deficiency: Continue to replete vitamin D monthly  8.  DVT prophylaxis: Early ambulation 9.  GI prophylaxis: None       All the records are reviewed and case discussed with Care Management/Social Workerr. Management plans discussed with the patient, family and they are in agreement.  CODE STATUS: fc  TOTAL TIME TAKING CARE OF THIS PATIENT: 36 minutes.   POSSIBLE D/C IN 2-3 DAYS, DEPENDING ON CLINICAL CONDITION.  Note: This dictation was prepared with Dragon dictation along with smaller phrase technology. Any transcriptional errors that result from this process are unintentional.   Nicholes Mango M.D on 04/06/2017 at 4:25 PM  Between 7am to 6pm - Pager - [phone] After 6pm go to [email or web address] - password [email or web address] [facility]  [facility]  Office  [phone]  CC: Primary care physician; [name], MD

## 2017-04-07 MED ORDER — ASPIRIN EC 325 MG PO TBEC: 325.0000 mg | DELAYED_RELEASE_TABLET | Freq: Two times a day (BID) | ORAL | 0 refills | Status: DC

## 2017-04-07 MED ORDER — OXYCODONE-ACETAMINOPHEN 5-325 MG PO TABS: 1.0000 | ORAL_TABLET | Freq: Four times a day (QID) | ORAL | 0 refills | Status: DC | PRN

## 2017-04-07 MED ORDER — MUPIROCIN 2 % EX OINT: 1.0000 "application " | TOPICAL_OINTMENT | Freq: Two times a day (BID) | CUTANEOUS | 0 refills | Status: DC

## 2017-04-07 MED ORDER — DOCUSATE SODIUM 100 MG PO CAPS: 100.0000 mg | ORAL_CAPSULE | Freq: Two times a day (BID) | ORAL | 0 refills | Status: DC

## 2017-04-07 NOTE — Discharge Instructions (Signed)
Follow-up with primary care physician in 5-7 days Follow-up with orthopedics Dr. Sabra Heck in 2 weeks Follow-up with oncology as recommended as an outpatient

## 2017-04-07 NOTE — Progress Notes (Signed)
PT is recommending SNF. Clinical Social Worker (CSW) contacted patient's daughter [name] and made her aware of above. Daughter reported that she works at [employer] and has her Customer service manager. Daughter reported that she feels comfortable taking patient home with her. Daughter reported that patient broke her other leg 2 years ago and they went through the same thing. CSW made daughter aware that patient is very limited with mobility. Daughter verbalized her understanding and refused SNF. Daughter requested a home health aide everyday. CSW explained that a home health aide will come 2-3 days per week and discussed private pay options at home. CSW met with patient and made her aware of above. Plan is for patient to D/C home to [place]. RN case manager aware of above.   [employer], LCSW [phone]

## 2017-04-07 NOTE — Care Management Note (Signed)
Case Management Note  Patient Details  Name: Emma [name] MRN: [id number] Date of Birth: 11-03-1940  Subjective/Objective:   Discharging today                 Action/Plan: [facility] notified of discharge. Requested [facility] contact daughter for first visit as soon as possible.   Expected Discharge Date:  04/07/17               Expected Discharge Plan:  [place]  In-House Referral:     Discharge planning Services  CM Consult  Post Acute Care Choice:  Home Health Choice offered to:  Adult Children  DME Arranged:    DME Agency:     HH Arranged:  PT, Nurse's Aide [place] Agency:  [place]  Status of Service:  Completed, signed off  If discussed at [place] of Stay Meetings, dates discussed:    Additional Comments:  Jolly Mango, RN 04/07/2017, 11:33 AM

## 2017-04-07 NOTE — Discharge Summary (Signed)
[facility] [place] NAME: Emma [name]    MR#:  [id number]  DATE OF BIRTH:  09-27-1940  DATE OF ADMISSION:  04/05/2017 ADMITTING PHYSICIAN: Harrie Foreman, MD  DATE OF DISCHARGE:  04/07/17  PRIMARY CARE PHYSICIAN: [name], MD    ADMISSION DIAGNOSIS:  Other closed fracture of proximal end of right tibia, initial encounter [S82.191A] Other closed fracture of proximal end of right fibula, initial encounter [S82.831A]  DISCHARGE DIAGNOSIS:  Active Problems:   Tibia/fibula fracture   SECONDARY DIAGNOSIS:   Past Medical History:  Diagnosis Date  . Aromatase inhibitor use   . Breast cancer ([facility]) 2014   left breast cancer/radiation  . Breast cancer, left breast ([facility]) 06/19/2014  . CHF (congestive heart failure) ([facility])    recent sepsis  . Chronic kidney disease   . Dizziness   . Dyspnea    recently while admitted  . GERD (gastroesophageal reflux disease)   . Hearing loss   . History of hiatal hernia   . History of kidney stones   . HOH (hard of hearing)    severe  . Hypertension   . Hypothyroidism   . Leg DVT (deep venous thromboembolism), acute ([place]) 07/2015   left leg after fracture  . 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 Pain    chest pain while admitted  . Thyroid disease     HOSPITAL COURSE:   HPI: The patient with past medical history of breast cancer, CHF, hypothyroidism, hypertension and metastatic carcinoid syndrome presents emergency department after suffering a fall.  The patient was changing a light bulb when she fell.  She explains pain in her right lower extremity immediately which made it difficult to walk.  In the emergency department x-ray of the limb revealed mildly comminuted fracture of the tibia and fibula.  Orthopedic surgery was contacted and the hospitalist service was called for admission   1. Acute fracture: Tibiofibular of the right leg.  Could be stress fractureThe patient has been  splinted initially and Dr. Sabra Heck placed a long-leg cast on 04/06/2017 recommending, outpatient follow-up in 2 weeks after discharge.  Orthopedics and PT is recommending the patient to go to [facility] but patient is refusing wants to go home with home health  Aspirin enteric-coated 325 mg twice a day as recommended by orthopedics for 2weeks until seen by orthopedics and if they are okay at that point patient can be switched back to aspirin 81 mg enteric-coated her previous home dose  Pain management as neededThey do not plan for surgery at this time.   2. CHF: Diastolic; chronic. Continue Lasix per home regimen.   3. Hypothyroidism: Normal TSH; continue Synthroid  4. History of breast cancer: Continue Arimidex outpatient follow-up with oncology as recommended  5. Carcinoid syndrome: The patient still takes octreotide on a monthly basis  6. Hypertension: Uncontrolled (likely due to pain). Continue hydrochlorothiazide, labetalol and losartan.  7. Vitamin D deficiency: Continue to replete vitamin D monthly  8. DVT prophylaxis: Early ambulation, aspirin enteric-coated 325 mg twice a day  9. GI prophylaxis: None     DISCHARGE CONDITIONS:   Stable  CONSULTS OBTAINED:  Treatment Team:  Earnestine Leys, MD   PROCEDURES right leg-long cast  DRUG ALLERGIES:   Allergies  Allergen Reactions  . Sulfa Antibiotics Other (See Comments)    Other reaction(s): Kidney Disorder    DISCHARGE MEDICATIONS:   Allergies as of 04/07/2017      Reactions   Sulfa Antibiotics  Other (See Comments)   Other reaction(s): Kidney Disorder      Medication List    TAKE these medications   acetaminophen 500 MG tablet Commonly known as:  TYLENOL Take 500 mg by mouth every 6 (six) hours as needed (for pain.).   anastrozole 1 MG tablet Commonly known as:  ARIMIDEX Take 1 tablet (1 mg total) by mouth daily.   aspirin EC 325 MG tablet Take 1 tablet (325 mg total) by mouth 2 (two) times  daily. What changed:    medication strength  how much to take  when to take this   docusate sodium 100 MG capsule Commonly known as:  COLACE Take 1 capsule (100 mg total) by mouth 2 (two) times daily.   furosemide 40 MG tablet Commonly known as:  LASIX Take 40 mg by mouth daily.   labetalol 200 MG tablet Commonly known as:  NORMODYNE TAKE 1 TABLET (200MG  TOTAL) BY MOUTH 2 (TWO) TIMES DAILY.   losartan-hydrochlorothiazide 100-25 MG tablet Commonly known as:  HYZAAR Take 1 tablet by mouth daily.   mupirocin ointment 2 % Commonly known as:  BACTROBAN Place 1 application into the nose 2 (two) times daily.   octreotide 30 MG injection Commonly known as:  SANDOSTATIN LAR Inject 30 mg into the muscle every 28 (twenty-eight) days.   oxyCODONE-acetaminophen 5-325 MG tablet Commonly known as:  PERCOCET/ROXICET Take 1 tablet by mouth every 6 (six) hours as needed for moderate pain.   potassium chloride SA 20 MEQ tablet Commonly known as:  K-DUR,KLOR-CON Take 2 tablets (40 mEq total) by mouth 2 (two) times daily.   SYNTHROID 88 MCG tablet Generic drug:  levothyroxine Take 88 mcg by mouth daily before breakfast.   TIGER BALM PAIN RELIEVING 80-24-16 MG Ptch Generic drug:  Camphor-Menthol-Capsicum Place 1 patch onto the skin daily as needed (for knee pain.).   Vitamin D (Ergocalciferol) 50000 units Caps capsule Commonly known as:  DRISDOL TAKE 1 CAPSULE (50,000 UNITS TOTAL) BY MOUTH ONCE A WEEK.        DISCHARGE INSTRUCTIONS:   Follow-up with primary care physician in 5-7 days Follow-up with orthopedics Dr. Sabra Heck in 2 weeks Follow-up with oncology as recommended as an outpatient  DIET:  Cardiac diet  DISCHARGE CONDITION:  Fair  ACTIVITY:  Activity as tolerated per PT/ORTHO  OXYGEN:  Home Oxygen: No.   Oxygen Delivery: room air  DISCHARGE LOCATION:  home   If you experience worsening of your admission symptoms, develop shortness of breath, life  threatening emergency, suicidal or homicidal thoughts you must seek medical attention immediately by calling 911 or calling your MD immediately  if symptoms less severe.  You Must read complete instructions/literature along with all the possible adverse reactions/side effects for all the Medicines you take and that have been prescribed to you. Take any new Medicines after you have completely understood and accpet all the possible adverse reactions/side effects.   Please note  You were cared for by a hospitalist during your hospital stay. If you have any questions about your discharge medications or the care you received while you were in the hospital after you are discharged, you can call the unit and asked to speak with the hospitalist on call if the hospitalist that took care of you is not available. Once you are discharged, your primary care physician will handle any further medical issues. Please note that NO REFILLS for any discharge medications will be authorized once you are discharged, as it is imperative that you  return to your primary care physician (or establish a relationship with a primary care physician if you do not have one) for your aftercare needs so that they can reassess your need for medications and monitor your lab values.     Today  Chief Complaint  Patient presents with  . Leg Pain   Patient is out of bed to chair.  Worked with physical therapy recommending skilled nursing facility facility but patient and daughter are refusing the want to go home with home health  ROS:  CONSTITUTIONAL: Denies fevers, chills. Denies any fatigue, weakness.  EYES: Denies blurry vision, double vision, eye pain. EARS, NOSE, THROAT: Denies tinnitus, ear pain, hearing loss. RESPIRATORY: Denies cough, wheeze, shortness of breath.  CARDIOVASCULAR: Denies chest pain, palpitations, edema.  GASTROINTESTINAL: Denies nausea, vomiting, diarrhea, abdominal pain. Denies bright red blood per  rectum. GENITOURINARY: Denies dysuria, hematuria. ENDOCRINE: Denies nocturia or thyroid problems. HEMATOLOGIC AND LYMPHATIC: Denies easy bruising or bleeding. SKIN: Denies rash or lesion. MUSCULOSKELETAL: Right leg with long cast  nEUROLOGIC: Denies paralysis, paresthesias.  PSYCHIATRIC: Denies anxiety or depressive symptoms.   VITAL SIGNS:  Blood pressure (!) 122/54, pulse 71, temperature 98.4 F (36.9 C), temperature source Oral, resp. rate 16, height 5\' 6"  (1.676 m), weight 106.1 kg (234 lb), SpO2 95 %.  I/O:    Intake/Output Summary (Last 24 hours) at 04/07/2017 1406 Last data filed at 04/07/2017 0900 Gross per 24 hour  Intake 360 ml  Output 500 ml  Net -140 ml    PHYSICAL EXAMINATION:  GENERAL:  77 y.o.-year-old patient lying in the bed with no acute distress.  EYES: Pupils equal, round, reactive to light and accommodation. No scleral icterus. Extraocular muscles intact.  HEENT: Head atraumatic, normocephalic. Oropharynx and nasopharynx clear.  NECK:  Supple, no jugular venous distention. No thyroid enlargement, no tenderness.  LUNGS: Normal breath sounds bilaterally, no wheezing, rales,rhonchi or crepitation. No use of accessory muscles of respiration.  CARDIOVASCULAR: S1, S2 normal. No murmurs, rubs, or gallops.  ABDOMEN: Soft, non-tender, non-distended. Bowel sounds present. No organomegaly or mass.  EXTREMITIES: Right lower extremity with long cast NEUROLOGIC: Cranial nerves II through XII are intact. Muscle strength at her baseline in all extremities except right lower extremity . Sensation intact. Gait not checked.  PSYCHIATRIC: The patient is alert and oriented x 3.  SKIN: No obvious rash, lesion, or ulcer.   DATA REVIEW:   CBC Recent Labs  Lab 04/05/17 0310  WBC 9.5  HGB 13.2  HCT 39.9  PLT 194    Chemistries  Recent Labs  Lab 04/05/17 0310  NA 139  K 4.1  CL 101  CO2 26  GLUCOSE 137*  BUN 26*  CREATININE 0.87  CALCIUM 9.8    Cardiac  Enzymes No results for input(s): TROPONINI in the last 168 hours.  Microbiology Results  Results for orders placed or performed during the hospital encounter of 04/05/17  Surgical pcr screen     Status: Abnormal   Collection Time: 04/05/17  6:25 AM  Result Value Ref Range Status   MRSA, PCR NEGATIVE NEGATIVE Final   Staphylococcus aureus POSITIVE (A) NEGATIVE Final    Comment: (NOTE) The Xpert SA Assay (FDA approved for NASAL specimens in patients 69 years of age and older), is one component of a comprehensive surveillance program. It is not intended to diagnose infection nor to guide or monitor treatment. Performed at [facility], [address], [place], [place] [address]     RADIOLOGY:  Dg Tibia/fibula  Right  Result Date: 04/05/2017 CLINICAL DATA:  Leg pain EXAM: RIGHT TIBIA AND FIBULA - 2 VIEW COMPARISON:  None. 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 Possible mildly impacted fracture at the fibular neck. Acute slightly comminuted fracture at the proximal shaft of the tibia with less than 1/4 bone with of posterior displacement of distal fracture fragment. Diffuse soft tissue edema. Moderate plantar calcaneal spur. Possible avulsion adjacent to the lateral talus. IMPRESSION: 1. Acute mildly comminuted and displaced fracture of the proximal shaft of the tibia 2. Possible nondisplaced minimally impacted fracture of the fibular neck. 3. Possible cortical avulsion inferolateral talus, if tender here could obtain dedicated ankle radiographs. Electronically Signed   By: Donavan Foil M.D.   On: 04/05/2017 03:51    EKG:   Orders placed or performed during the hospital encounter of 02/07/16  . ED EKG  . ED EKG  . EKG 12-Lead  . EKG 12-Lead      Management plans discussed with the patient, family and they are in agreement.  CODE STATUS:     Code Status Orders  (From admission, onward)        Start     Ordered   04/05/17 0621  Full code  Continuous     04/05/17  0620    Code Status History    Date Active Date Inactive Code Status Order ID Comments User Context   01/25/2016 20:04 01/29/2016 22:45 Full Code [id number]  Demetrios Loll, MD Inpatient   12/24/2015 20:20 12/26/2015 20:46 Full Code [id number]  Lytle Butte, MD ED   12/19/2015 04:10 12/21/2015 18:43 Full Code [id number]  Harrie Foreman, MD Inpatient   08/26/2015 01:23 08/27/2015 20:31 Full Code [id number]  Lance Coon, MD ED      TOTAL TIME TAKING CARE OF THIS PATIENT: 45  minutes.   Note: This dictation was prepared with Dragon dictation along with smaller phrase technology. Any transcriptional errors that result from this process are unintentional.   @[facility] @  on 04/07/2017 at 2:06 PM  Between 7am to 6pm - Pager - [phone]  After 6pm go to [email or web address] - password [email or web address] [facility]  [facility]  Office  [phone]  CC: Primary care physician; [name], MD

## 2017-04-07 NOTE — Evaluation (Signed)
Physical Therapy Evaluation Patient Details Name: Emma [name] MRN: [id number] DOB: 06-06-1940 Today's Date: 04/07/2017   History of Present Illness  Pt is a 77 y.o. F who presented to hospital w/ complaint of RLE pain 04/05/17.  Pt was admitted 04/05/17 w/ diagnosis of proximal 1/3 tibial/fibula fracture.  Pt was placed in a long leg cast on 04/06/17. Pt was seen and evaluated by PT on 04/07/17. PMHx includes: HTN, CHF, L sided breast cancer, chronic kidney disease, dyspnea, HOH, hiatal hernia, kidney stones, hypothyroidism, L leg fracture 2016.    Clinical Impression  Pt required +2, mod assist from both therapists, with transfers (see mobility for details). Pt tolerated transfers fairly well with RLE pain increasing from a constant 5/10 to 6/10 with movement, RN notified. Pt fatigues easily and requires mod assist x 2 w/ transfers and attempt at ambulation and also requires assist in order to maintain RLE NWB status w/ functional mobility. Pt would benefit from skilled PT to maintain RLE NWB, progress strength, transfers, activity tolerance, and promote optimal return to PLOF; Recommend transition to SNF upon discharge from acute hospitalization.    Follow Up Recommendations SNF    Equipment Recommendations  Rolling walker with 5" wheels;3in1 (PT);Wheelchair (measurements PT)(WC with elevated leg rests)    Recommendations for Other Services       Precautions / Restrictions Precautions Precautions: Fall Restrictions Weight Bearing Restrictions: Yes RLE Weight Bearing: Non weight bearing Other Position/Activity Restrictions: RLE NWB      Mobility  Bed Mobility Overal bed mobility: Needs Assistance Bed Mobility: Supine to Sit     Supine to sit: Mod assist;Min assist;HOB elevated     General bed mobility comments: Pt required min assist to help move RLE across bed and lower to floor. Pt required mod assist to sit up fully with HOB elevated and vc's to push off of bed with one hand  and use bedrail to pull with the other.  Transfers Overall transfer level: Needs assistance Equipment used: Rolling walker (2 wheeled) Transfers: Sit to/from [facility] Sit to Stand: +2 physical assistance;From elevated surface         General transfer comment: Pt required +2 assist and vc's to bring L foot back, push through LLE, push up with one hand on bed and one hand on middle bar of RW to transfer sit to stand with bed elevated to chair height. Pt required +2 and vc to push through BUE on RW to keep NWB in RLE when pivoting on LLE toward R side onto bedside commode.  Ambulation/Gait Ambulation/Gait assistance: +2 physical assistance Ambulation Distance (Feet): 2 Feet Assistive device: Rolling walker (2 wheeled) Gait Pattern/deviations: Step-to pattern;Decreased step length - left Gait velocity: decreased.   General Gait Details: Pt required +2 and vc's to push through BUE on RW with straight arms to keep NWB in RLE when stepping with LLE towards the L to transfer from [facility] to chair. Pt fatigued quickly and therapist had to move chair closer in order for Pt to sit quickly.  Stairs            Wheelchair Mobility    Modified Rankin (Stroke Patients Only)       Balance Overall balance assessment: Needs assistance Sitting-balance support: Feet supported;No upper extremity supported Sitting balance-Leahy Scale: Good Sitting balance - Comments: Pt demonstrated good balance when reaching within and outside of BOS during toileting; L foot was supported on floor and R foot was elevated by pillow.  Standing balance-Leahy Scale: Zero Standing balance comment: Pt required +2 mod assist by both therapist, and vc's to push through BUE on RW to keep NWB in RLE and contract LLE for stability.                             Pertinent Vitals/Pain Pain Assessment: 0-10 Pain Score: 5  Pain Location: RLE(end of session, in chair) Pain Descriptors /  Indicators: Aching;Sore Pain Intervention(s): Limited activity within patient's tolerance;Monitored during session;Premedicated before session;Repositioned    Home Living Family/patient expects to be discharged to:: Private residence Living Arrangements: Alone Available Help at Discharge: Family;Available PRN/intermittently Type of Home: House Home Access: Stairs to enter Entrance Stairs-Rails: None Entrance Stairs-Number of Steps: 1 Home Layout: One level Home Equipment: Walker - 2 wheels;Wheelchair - manual;Cane - quad;Cane - single point;Shower seat Additional Comments: Pt plans to live at daughter [name]'s house after discharge. Information provided on home layout is regarding [name]'s home.    Prior Function Level of Independence: Independent with assistive device(s)   Gait / Transfers Assistance Needed: Pt reported that she ambulated with 2 canes for household distances (1 cane and 1 spc), and a RW or WC for community distances.           Hand Dominance        Extremity/Trunk Assessment   Upper Extremity Assessment Upper Extremity Assessment: Overall WFL for tasks assessed RUE Deficits / Details: Pt demonstrated BUE strength of at least 3/5 while standing and pushing through BUE on RW for pivot to bedside commode.  Pt demonstrated BUE ROM WNL when reaching overhead. LUE Deficits / Details: Pt demonstrated BUE strength of at least 3/5 while standing and pushing through BUE on RW for pivot to bedside commode.  Pt demonstrated BUE ROM WNL when reaching overhead.    Lower Extremity Assessment Lower Extremity Assessment: LLE deficits/detail;RLE deficits/detail RLE Deficits / Details: Pt demonstrated at least 3/5 R hip strength with abduction/adduction and 2/5 R hip flexion, when transferring from supine to sitting EOB. RLE: Unable to fully assess due to pain LLE Deficits / Details: Pt demonstrated LLE strength of at least 3/5 when pushing through LLE to transfer from sit to stand  from EOB and bedside commode.    Cervical / Trunk Assessment Cervical / Trunk Assessment: Normal  Communication   Communication: HOH  Cognition Arousal/Alertness: Awake/alert Behavior During Therapy: WFL for tasks assessed/performed Overall Cognitive Status: Within Functional Limits for tasks assessed                                 General Comments: Pt A&O x 4      General Comments   Pt agreeable to session.   Exercises     Assessment/Plan    PT Assessment Patient needs continued PT services  PT Problem List Decreased strength;Decreased balance;Decreased mobility;Decreased activity tolerance;Decreased coordination;Decreased safety awareness;Decreased knowledge of use of DME;Decreased knowledge of precautions;Pain       PT Treatment Interventions DME instruction;Functional mobility training;Balance training;Gait training;Therapeutic activities;Stair training;Therapeutic exercise;Patient/family education    PT Goals (Current goals can be found in the Care Plan section)  Acute Rehab PT Goals Patient Stated Goal: I want to be able to go home. PT Goal Formulation: With patient Time For Goal Achievement: 04/21/17 Potential to Achieve Goals: Fair    Frequency 7X/week   Barriers to discharge  Co-evaluation               AM-PAC PT "6 Clicks" Daily Activity  Outcome Measure Difficulty turning over in bed (including adjusting bedclothes, sheets and blankets)?: A Lot Difficulty moving from lying on back to sitting on the side of the bed? : Unable Difficulty sitting down on and standing up from a chair with arms (e.g., wheelchair, bedside commode, etc,.)?: Unable Help needed moving to and from a bed to chair (including a wheelchair)?: Total Help needed walking in hospital room?: Total Help needed climbing 3-5 steps with a railing? : Total 6 Click Score: 7    End of Session Equipment Utilized During Treatment: Gait belt Activity Tolerance: Patient  limited by fatigue;Patient limited by pain Patient left: in chair;with call bell/phone within reach;with chair alarm set(B heels elevated by pillow.) Nurse Communication: Mobility status;Weight bearing status PT Visit Diagnosis: Unsteadiness on feet (R26.81);Other abnormalities of gait and mobility (R26.89);Muscle weakness (generalized) (M62.81);Difficulty in walking, not elsewhere classified (R26.2);Pain Pain - Right/Left: Right Pain - part of body: Leg    Time: 1010-1102 PT Time Calculation (min) (ACUTE ONLY): 52 min   Charges:         PT G Codes:        Nanette Wirsing Mondrian-Pardue, SPT 04/07/2017, 2:01 PM

## 2017-04-10 ENCOUNTER — Telehealth: Payer: Self-pay | Admitting: *Deleted

## 2017-04-10 NOTE — Telephone Encounter (Signed)
Colette will r/s the apts.

## 2017-04-10 NOTE — Telephone Encounter (Signed)
-----   Message from Velora Mediate sent at 04/10/2017  3:51 PM EDT ----- Regarding: Reschedule 3/25 Appt. Contact: [phone] Daughter called and needs to reschedule patient's appt. Patients has broken her leg in several places and will not be able to come in to her appointment scheduled for Monday 3/25. Please call patient's daughter to reschedule.  Thanks, Lindley Magnus

## 2017-04-17 ENCOUNTER — Other Ambulatory Visit: Payer: Self-pay | Admitting: Genetic Counselor

## 2017-04-17 DIAGNOSIS — C50919 Malignant neoplasm of unspecified site of unspecified female breast: Secondary | ICD-10-CM

## 2017-04-20 ENCOUNTER — Other Ambulatory Visit: Payer: Self-pay | Admitting: *Deleted

## 2017-04-20 ENCOUNTER — Ambulatory Visit: Payer: Medicare Other

## 2017-04-20 ENCOUNTER — Ambulatory Visit: Payer: Medicare Other | Admitting: Internal Medicine

## 2017-04-20 ENCOUNTER — Other Ambulatory Visit: Payer: Medicare Other

## 2017-04-22 ENCOUNTER — Ambulatory Visit: Payer: Medicare Other | Admitting: Family

## 2017-04-27 ENCOUNTER — Inpatient Hospital Stay: Payer: Medicare Other

## 2017-04-27 ENCOUNTER — Inpatient Hospital Stay (HOSPITAL_BASED_OUTPATIENT_CLINIC_OR_DEPARTMENT_OTHER): Payer: Medicare Other | Admitting: Internal Medicine

## 2017-04-27 ENCOUNTER — Inpatient Hospital Stay: Payer: Medicare Other | Attending: Internal Medicine

## 2017-04-27 ENCOUNTER — Other Ambulatory Visit: Payer: Self-pay

## 2017-04-27 VITALS — BP 131/81 | HR 56 | Temp 97.9°F | Resp 22 | Ht 66.0 in | Wt 208.0 lb

## 2017-04-27 DIAGNOSIS — Z853 Personal history of malignant neoplasm of breast: Secondary | ICD-10-CM

## 2017-04-27 DIAGNOSIS — N189 Chronic kidney disease, unspecified: Secondary | ICD-10-CM | POA: Insufficient documentation

## 2017-04-27 DIAGNOSIS — K6389 Other specified diseases of intestine: Secondary | ICD-10-CM

## 2017-04-27 DIAGNOSIS — C7A019 Malignant carcinoid tumor of the small intestine, unspecified portion: Secondary | ICD-10-CM | POA: Insufficient documentation

## 2017-04-27 DIAGNOSIS — I130 Hypertensive heart and chronic kidney disease with heart failure and stage 1 through stage 4 chronic kidney disease, or unspecified chronic kidney disease: Secondary | ICD-10-CM | POA: Insufficient documentation

## 2017-04-27 DIAGNOSIS — D509 Iron deficiency anemia, unspecified: Secondary | ICD-10-CM | POA: Insufficient documentation

## 2017-04-27 DIAGNOSIS — E340 Carcinoid syndrome: Secondary | ICD-10-CM | POA: Diagnosis not present

## 2017-04-27 DIAGNOSIS — C7B02 Secondary carcinoid tumors of liver: Secondary | ICD-10-CM | POA: Insufficient documentation

## 2017-04-27 DIAGNOSIS — C50919 Malignant neoplasm of unspecified site of unspecified female breast: Secondary | ICD-10-CM

## 2017-04-27 LAB — CBC WITH DIFFERENTIAL/PLATELET
BASOS ABS: 0 10*3/uL (ref 0–0.1)
Basophils Relative: 1 %
Eosinophils Absolute: 0.3 10*3/uL (ref 0–0.7)
Eosinophils Relative: 4 %
HCT: 37.6 % (ref 35.0–47.0)
Hemoglobin: 12.7 g/dL (ref 12.0–16.0)
LYMPHS PCT: 9 %
Lymphs Abs: 0.6 10*3/uL — ABNORMAL LOW (ref 1.0–3.6)
MCH: 29.6 pg (ref 26.0–34.0)
MCHC: 33.9 g/dL (ref 32.0–36.0)
MCV: 87.4 fL (ref 80.0–100.0)
Monocytes Absolute: 0.7 10*3/uL (ref 0.2–0.9)
Monocytes Relative: 11 %
Neutro Abs: 4.8 10*3/uL (ref 1.4–6.5)
Neutrophils Relative %: 75 %
Platelets: 215 10*3/uL (ref 150–440)
RBC: 4.3 MIL/uL (ref 3.80–5.20)
RDW: 14.5 % (ref 11.5–14.5)
WBC: 6.5 10*3/uL (ref 3.6–11.0)

## 2017-04-27 LAB — COMPREHENSIVE METABOLIC PANEL
ALBUMIN: 3.6 g/dL (ref 3.5–5.0)
ALT: 12 U/L — AB (ref 14–54)
AST: 25 U/L (ref 15–41)
Alkaline Phosphatase: 138 U/L — ABNORMAL HIGH (ref 38–126)
Anion gap: 10 (ref 5–15)
BILIRUBIN TOTAL: 1 mg/dL (ref 0.3–1.2)
BUN: 23 mg/dL — ABNORMAL HIGH (ref 6–20)
CO2: 23 mmol/L (ref 22–32)
Calcium: 9.6 mg/dL (ref 8.9–10.3)
Chloride: 102 mmol/L (ref 101–111)
Creatinine, Ser: 0.86 mg/dL (ref 0.44–1.00)
GFR calc Af Amer: >60 mL/min (ref >60)
GFR calc non Af Amer: >60 mL/min (ref >60)
GLUCOSE: 123 mg/dL — AB (ref 65–99)
POTASSIUM: 3.4 mmol/L — AB (ref 3.5–5.1)
Sodium: 135 mmol/L (ref 135–145)
TOTAL PROTEIN: 7.1 g/dL (ref 6.5–8.1)

## 2017-04-27 MED ORDER — OCTREOTIDE ACETATE 20 MG IM KIT
20.0000 mg | PACK | Freq: Once | INTRAMUSCULAR | Status: AC
  Administered 2017-04-27: 20 mg via INTRAMUSCULAR
  Filled 2017-04-27: qty 1

## 2017-04-27 NOTE — Progress Notes (Signed)
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 No family.   A very pleasant 76 year old female patient with above history of breast cancer and also mesenteric mass/small bowel carcinoid is here for follow-up/ Proceed with Sandostatin.  Patient states that she fell recently-and broke her right ankle/lower extremity.  She is currently in a cast.  Otherwise denies any unusual shortness of breath or cough.  No blood in stools or black or stools.  No recent hospitalizations otherwise.  No diarrhea.  REVIEW OF SYSTEMS:  A complete 10 point review of system is done which is negative except mentioned  above/history of present illness.   PAST MEDICAL HISTORY :  Past Medical History:  Diagnosis Date  . Aromatase inhibitor use   . Breast cancer ([place]) 2014   left breast cancer/radiation  . Breast cancer, left breast ([place]) 06/19/2014  . CHF (congestive heart failure) ([facility])    recent sepsis  . Chronic kidney disease   . Dizziness   . Dyspnea    recently while admitted  . GERD (gastroesophageal reflux disease)   . Hearing loss   . History of hiatal hernia   . History of kidney stones   . HOH (hard of hearing)    severe  . Hypertension   . Hypothyroidism   . Leg DVT (deep venous thromboembolism), acute ([place]) 07/2015   left leg after fracture  . 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 Pain    chest pain while admitted  . Thyroid disease     PAST SURGICAL HISTORY :   Past Surgical History:  Procedure Laterality Date  . ABDOMINAL HYSTERECTOMY     partial  . BREAST EXCISIONAL BIOPSY Left 12/2012   +  . BREAST LUMPECTOMY Left 2014  . BREAST LUMPECTOMY WITH SENTINEL LYMPH NODE BIOPSY Left 2014  . CHOLECYSTECTOMY    . CYSTOSCOPY W/ URETERAL STENT PLACEMENT Right 01/07/2016   Procedure: CYSTOSCOPY WITH STENT REPLACEMENT;  Surgeon: Hollice Espy, MD;  Location: [facility] ORS;  Service: Urology;  Laterality: Right;  . CYSTOSCOPY WITH RETROGRADE PYELOGRAM, URETEROSCOPY AND STENT PLACEMENT Right 12/19/2015   Procedure: CYSTOSCOPY WITH RETROGRADE PYELOGRAM, URETEROSCOPY AND STENT PLACEMENT;  Surgeon: Ardis Hughs, MD;  Location: [facility] OR[facility];  Service: Urology;  Laterality: Right;  . URETEROSCOPY WITH HOLMIUM LASER LITHOTRIPSY Right 01/07/2016   Procedure: URETEROSCOPY WITH HOLMIUM LASER LITHOTRIPSY;  Surgeon: Hollice Espy, MD;  Location: [facility] ORS;  Service: Urology;  Laterality: Right;    FAMILY HISTORY :   Family History  Problem Relation Age of Onset  . Breast cancer Mother 11       deceased 62  . Breast cancer Maternal Aunt 68       deceased 34s  . Prostate cancer Father         deceased 76  . Colon cancer Paternal Aunt 80       deceased 67s    SOCIAL HISTORY:   Social History   Tobacco Use  . Smoking status: Never Smoker  . Smokeless tobacco: Never Used  Substance Use Topics  . Alcohol use: No    Alcohol/week: 0.0 oz  . Drug use: No    ALLERGIES:  is allergic to sulfa antibiotics.  MEDICATIONS:  Current Outpatient Medications  Medication Sig Dispense Refill  . acetaminophen (TYLENOL) 500 MG tablet Take 500 mg by mouth every 6 (six) hours as needed (for pain.).    Marland Kitchen anastrozole (ARIMIDEX) 1 MG tablet Take 1 tablet (1 mg total) by mouth daily. 90 tablet 6  . aspirin EC 325 MG tablet Take 1 tablet (325 mg total) by mouth 2 (two) times daily. 30 tablet 0  . Camphor-Menthol-Capsicum (TIGER BALM PAIN RELIEVING) 80-24-16 MG PTCH Place 1 patch onto the skin daily as needed (for knee pain.).    Marland Kitchen furosemide (LASIX) 40 MG tablet Take 40 mg by mouth daily.    Marland Kitchen labetalol (NORMODYNE) 200 MG tablet TAKE 1 TABLET (200MG TOTAL) BY MOUTH 2 (TWO) TIMES DAILY.    Marland Kitchen levothyroxine (SYNTHROID) 88 MCG tablet Take 88 mcg by mouth daily before breakfast.     . losartan-hydrochlorothiazide (HYZAAR) 100-25 MG tablet Take 1 tablet by mouth daily.    . mupirocin ointment (BACTROBAN) 2 % Place 1 application into the nose 2 (two) times daily. 22 g 0  . octreotide (SANDOSTATIN LAR) 30 MG injection Inject 30 mg into the muscle every 28 (twenty-eight) days.     Marland Kitchen oxyCODONE-acetaminophen (PERCOCET/ROXICET) 5-325 MG tablet Take 1 tablet by mouth every 6 (six) hours as needed for moderate pain. 30 tablet 0  . potassium chloride SA (K-DUR,KLOR-CON) 20 MEQ tablet Take 2 tablets (40 mEq total) by mouth 2 (two) times daily. 120 tablet 5  . Vitamin D, Ergocalciferol, (DRISDOL) 50000 units CAPS capsule TAKE 1 CAPSULE (50,000 UNITS TOTAL) BY MOUTH ONCE A WEEK. 24 capsule 1  . alendronate (FOSAMAX) 70 MG tablet Take 70 mg by mouth once a week.  3  . docusate sodium (COLACE) 100 MG capsule Take 1  capsule (100 mg total) by mouth 2 (two) times daily. (Patient not taking: Reported on 04/27/2017) 10 capsule 0   No current facility-administered medications for this visit.     PHYSICAL EXAMINATION: ECOG PERFORMANCE STATUS: 0 - Asymptomatic  BP 131/81 (Patient Position: Sitting)   Pulse (!) 56   Temp 97.9 F (36.6 C) (Tympanic)   Resp (!) 22   Ht _0  (1.676 m)   Wt 208 lb (94.3 kg)   BMI 33.57 kg/m   Filed Weights   04/27/17 1202  Weight: 208 lb (94.3 kg)   GENERAL: Well-nourished well-developed; Alert, no distress and comfortable.hard of hearing.She is in a wheelchair. She is alone. EYES: no pallor or icterus OROPHARYNX: no thrush or ulceration;  good dentition  NECK: supple, no masses felt LYMPH: no palpable lymphadenopathy in the cervical, axillary or inguinal regions LUNGS: clear to auscultation and No wheeze or crackles HEART/CVS: regular rate & rhythm and no murmurs; No lower extremity edema; right foot leg/and cast ABDOMEN:abdomen soft, non-tender and normal bowel sounds Musculoskeletal:no cyanosis of digits and no clubbing  PSYCH: alert & oriented x 3 with fluent speech NEURO: no focal motor/sensory deficits SKIN: no rashes or significant lesions  LABORATORY DATA:  I have reviewed the data as listed    Component Value Date/Time   NA 135 04/27/2017 1145   NA 137 07/12/2013 0841   K 3.4 (L) 04/27/2017 1145   K 4.7 07/12/2013 0841   CL 102 04/27/2017 1145   CL 107 07/12/2013 0841   CO2 23 04/27/2017 1145   CO2 20 (L) 07/12/2013 0841   GLUCOSE 123 (H) 04/27/2017 1145   GLUCOSE 122 (H) 07/12/2013 0841   BUN 23 (H) 04/27/2017 1145   BUN 18 07/12/2013 0841   CREATININE 0.86 04/27/2017 1145   CREATININE 0.64 05/22/2014 1111   CALCIUM 9.6 04/27/2017 1145   CALCIUM 9.2 07/12/2013 0841   PROT 7.1 04/27/2017 1145   PROT 7.0 05/22/2014 1111   ALBUMIN 3.6 04/27/2017 1145   ALBUMIN 4.2 05/22/2014 1111   AST 25 04/27/2017 1145   AST 29 05/22/2014 1111   ALT  12 (L) 04/27/2017 1145   ALT 17 05/22/2014 1111   ALKPHOS 138 (H) 04/27/2017 1145   ALKPHOS 95 05/22/2014 1111   BILITOT 1.0 04/27/2017 1145   BILITOT 0.8 05/22/2014 1111   GFRNONAA >60 04/27/2017 1145   GFRNONAA >60 05/22/2014 1111   GFRAA >60 04/27/2017 1145   GFRAA >60 05/22/2014 1111    No results found for: SPEP, UPEP  Lab Results  Component Value Date   WBC 6.5 04/27/2017   NEUTROABS 4.8 04/27/2017   HGB 12.7 04/27/2017   HCT 37.6 04/27/2017   MCV 87.4 04/27/2017   PLT 215 04/27/2017      Chemistry      Component Value Date/Time   NA 135 04/27/2017 1145   NA 137 07/12/2013 0841   K 3.4 (L) 04/27/2017 1145   K 4.7 07/12/2013 0841   CL 102 04/27/2017 1145   CL 107 07/12/2013 0841   CO2 23 04/27/2017 1145   CO2 20 (L) 07/12/2013 0841   BUN 23 (H) 04/27/2017 1145   BUN 18 07/12/2013 0841   CREATININE 0.86 04/27/2017 1145   CREATININE 0.64 05/22/2014 1111      Component Value Date/Time   CALCIUM 9.6 04/27/2017 1145   CALCIUM 9.2 07/12/2013 0841   ALKPHOS 138 (H) 04/27/2017 1145   ALKPHOS 95 05/22/2014 1111   AST 25 04/27/2017 1145   AST 29 05/22/2014 1111   ALT 12 (L) 04/27/2017 1145   ALT 17 05/22/2014 1111   BILITOT 1.0 04/27/2017 1145   BILITOT 0.8 05/22/2014 1111     Results for LIZANN, EDELMAN (MRN [id number]) as of 04/27/2017 12:12  Ref. Range 12/24/2015 17:54 12/31/2015 15:50 01/17/2016 14:50 01/29/2016 16:23 02/07/2016 11:20 02/07/2016 22:37 03/30/2016 10:30 02/06/2017 08:00  5-HIAA, Ur Latest Ref Range: Undefined mg/L       6.7 2.8  5-HIAA,Quant.,24 Hr Urine Latest Ref Range: 0.0 - 14.9 mg/24 hr       3.0 2.2  Total Volume Unknown       450 800     ASSESSMENT & PLAN:  Carcinoid tumor of small intestine, malignant ([place]) # Non-  functional Small bowel/mesenteric carcinoid- with imaging suggestive of metastases to the liver- on octreotide LAR monthly basis. 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 No clinical  progression noted.   # Continue Octroetide shots monthly. Due for one today.  Urine 5-HIAA negative January 2019.  # Iron deficiency anemia/Hx of AV malformation- continue by mouth iron at this time.  # s/p fall- fracture of ankles/p cast [Dr.Miller]; awaiting re-eval for ORIF on April 23rd.  Discussed with the patient that the treatment that she is on currently should not interfere with wound healing.  # Genetic counselling-status post germline testing given history of breast cancer.  Awaiting results.  # LEFT BREAST CA STAGE I; ER/PR positive HER-2/neu negative. May 2018- mammogram- wnl.  Currently on Arimidex.  Clinically no evidence of recurrence. New script given.   # Follow-up with me in 2 months/labs; labs/ sandostain Shots monthly.       [name], MD 04/27/2017 1:08 PM

## 2017-04-27 NOTE — Assessment & Plan Note (Addendum)
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 No clinical progression noted.   # Continue Octroetide shots monthly. Due for one today.  Urine 5-HIAA negative January 2019.  # Iron deficiency anemia/Hx of AV malformation- continue by mouth iron at this time.  # s/p fall- fracture of ankles/p cast [Dr.Miller]; awaiting re-eval for ORIF on April 23rd.  Discussed with the patient that the treatment that she is on currently should not interfere with wound healing.  # Genetic counselling-status post germline testing given history of breast cancer.  Awaiting results.  # LEFT BREAST CA STAGE I; ER/PR positive HER-2/neu negative. May 2018- mammogram- wnl.  Currently on Arimidex.  Clinically no evidence of recurrence. New script given.   # Follow-up with me in 2 months/labs; labs/ sandostain Shots monthly.

## 2017-04-29 ENCOUNTER — Other Ambulatory Visit: Payer: Self-pay | Admitting: General Surgery

## 2017-04-29 DIAGNOSIS — Z853 Personal history of malignant neoplasm of breast: Secondary | ICD-10-CM

## 2017-05-05 ENCOUNTER — Telehealth: Payer: Self-pay | Admitting: Genetic Counselor

## 2017-05-05 NOTE — Telephone Encounter (Signed)
Cancer Genetics             Telegenetics Results Disclosure   Patient Name: Emma [name] Patient DOB: 04-Nov-1940 Patient Age: 77 y.o. Phone Call Date: 05/05/2017  Referring Provider: Charlaine Dalton, MD    Ms. Biskup and her daughter, [name], were called today to discuss genetic test results. A message was left to call me back to discuss the information below. Please see the Genetics telephone note from 04/03/2017 for a detailed discussion of her personal and family histories and the recommendations provided.  Genetic Testing: At the time of Ms. Wirthlin' telegenetics visit, she decided to pursue genetic testing of multiple genes associated with hereditary susceptibility to cancer. Testing included sequencing and deletion/duplication analysis. 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  A copy of the genetic test report will be scanned into Epic under the Media tab.  The genes analyzed were the 83 genes on [facility]'s Multi-Cancer panel (ALK, APC, ATM, AXIN2, BAP1, BARD1, BLM, BMPR1A, BRCA1, BRCA2, BRIP1, CASR, CDC73, CDH1, CDK4, CDKN1B, CDKN1C, CDKN2A, CEBPA, CHEK2, CTNNA1, DICER1, DIS3L2, EGFR, EPCAM, FH, FLCN, GATA2, GPC3, GREM1, HOXB13, HRAS, KIT, MAX, MEN1, MET, MITF, MLH1, MSH2, MSH3, MSH6, MUTYH, NBN, NF1, NF2, NTHL1, PALB2, PDGFRA, PHOX2B, PMS2, POLD1, POLE, POT1, PRKAR1A, PTCH1, PTEN, RAD50, RAD51C, RAD51D, RB1, RECQL4, RET, RUNX1, SDHA, SDHAF2, SDHB, SDHC, SDHD, SMAD4, SMARCA4, SMARCB1, SMARCE1, STK11, SUFU, TERC, TERT, TMEM127, TP53, TSC1, TSC2, VHL, WRN, WT1).  Since the current test is not perfect, it is possible that there may be a gene mutation that current testing cannot detect, but that chance is small. It is possible that a different genetic factor, which has not yet been discovered or is not on this panel, is responsible for the cancer diagnoses in the family. Again, the likelihood of this is low. No additional  testing is recommended at this time for Ms. Steelman.  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 While at this time, it is unknown if this finding is associated with increased cancer risk, the majority of these variants get reclassified to be inconsequential. Medical management should not be based on this finding. With time, we suspect the lab will determine the significance, if any. If we do learn more about it, we will try to contact Ms. Star to discuss it further. It is important to stay in touch with [place] periodically and keep the address and phone number up to date.  MUTYH Risks: We discussed 1-2% of individuals of [place] descent are carriers of a single MUTYH mutation. There is conflicting data regarding whether a single MUTYH mutation confers a moderate increased risk (up to 2-fold) for colorectal cancer. If an individual inherits two pathogenic mutations (one from each parent), they have a recessive form of hereditary colonic polyposis. This result does not explain Ms. Sullenger' cancer diagnosis and should not be over interpreted.  Medical Management: Being a carrier of a single MUTYH mutation is not thought to significantly increase cancer risk. Ms. Louison is recommended to follow routine cancer screenings with the exception of screening for colorectal cancer. The National Comprehensive Cancer Network Genetic/Familial High Risk Assessment: Colorectal (V1.2019) recommends the following in those with a single MUTYH mutation. Because the NCCN updates these guidelines periodically, clinicians are recommended to view the most recent guidelines at https://www.martin.info/.  - Beginning at age 11 or 70  years younger than the earliest diagnosis of colorectal cancer in a parent, sibling, or child (whichever is earlier): Colonoscopy every 5 years. If there is no family history of colorectal cancer, data are uncertain if specialized screening is warranted.  - These  recommendations may change if an individual has polyps, colorectal cancer, inflammatory bowel disease (IBD), or family history of colorectal cancer.  Family Members:  It is important that Ms. Lerch informs her relatives of these results. They are recommended to speak with a genetic counselor prior to any testing. Each of her daughters has a 50% chance of having inherited this MUTYH mutation. A genetic counselor can be located at [email or web address]. Family members should not get tested for the above VUS outside of a research protocol as this finding has no implications for their medical management.   Lastly, cancer genetics is a rapidly advancing field and it is possible that new genetic tests will be appropriate for Ms. Frieson in the future. We encourage Ms. Lape to remain in contact with Genetics on an annual basis so her personal and family histories can be updated.    Steele Berg, MS, [place] Certified Genetic Counselor phone: [phone]

## 2017-05-15 NOTE — Progress Notes (Signed)
Patient ID: Emma [name], female    DOB: 1940-11-05, 77 y.o.   MRN: [id number]  HPI  [name] is a 77 y/o female with a history of hypothyroidism, DVT, HTN, HOH, kidney stones, hiatal hernia, GERD, CKD, left breast cancer, sepsis and chronic heart failure.  Last echo was done 12/25/15 which showed an EF of 50-55% without any valvular regurgitation.   Admitted 04/05/17 due to right tib/fib fracture. Orthopaedics consulted and splint applied. Discharged after 2 days.   She presents today for her follow-up visit with a chief complaint of minimal fatigue upon moderate exertion. She has associated chest tightness, cough, shortness of breath, dizziness, edema and hard of hearing. She denies any weight gain, difficulty sleeping, abdominal distention, palpitations or chest pain. She has broken her right leg since she was last here and has a cast from mid-thigh down to mid-foot.   Past Medical History:  Diagnosis Date  . Aromatase inhibitor use   . Breast cancer ([place]) 2014   left breast cancer/radiation  . Breast cancer, left breast ([place]) 06/19/2014  . CHF (congestive heart failure) (HCC)    recent sepsis  . Chronic kidney disease   . Dizziness   . Dyspnea    recently while admitted  . GERD (gastroesophageal reflux disease)   . Hearing loss   . History of hiatal hernia   . History of kidney stones   . HOH (hard of hearing)    severe  . Hypertension   . Hypothyroidism   . Leg DVT (deep venous thromboembolism), acute ([place]) 07/2015   left leg after fracture  . 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 Pain    chest pain while admitted  . Thyroid disease    Past Surgical History:  Procedure Laterality Date  . ABDOMINAL HYSTERECTOMY     partial  . BREAST EXCISIONAL BIOPSY Left 12/2012   +  . BREAST LUMPECTOMY Left 2014  . BREAST LUMPECTOMY WITH SENTINEL LYMPH NODE BIOPSY Left 2014  . CHOLECYSTECTOMY    . CYSTOSCOPY W/ URETERAL STENT PLACEMENT Right 01/07/2016   Procedure: CYSTOSCOPY WITH  STENT REPLACEMENT;  Surgeon: Hollice Espy, MD;  Location: [facility];  Service: Urology;  Laterality: Right;  . CYSTOSCOPY WITH RETROGRADE PYELOGRAM, URETEROSCOPY AND STENT PLACEMENT Right 12/19/2015   Procedure: CYSTOSCOPY WITH RETROGRADE PYELOGRAM, URETEROSCOPY AND STENT PLACEMENT;  Surgeon: Ardis Hughs, MD;  Location: [facility];  Service: Urology;  Laterality: Right;  . URETEROSCOPY WITH HOLMIUM LASER LITHOTRIPSY Right 01/07/2016   Procedure: URETEROSCOPY WITH HOLMIUM LASER LITHOTRIPSY;  Surgeon: Hollice Espy, MD;  Location: [facility];  Service: Urology;  Laterality: Right;   Family History  Problem Relation Age of Onset  . Breast cancer Mother 5       deceased 31  . Breast cancer Maternal Aunt 68       deceased 22s  . Prostate cancer Father        deceased 60  . Colon cancer Paternal Aunt 80       deceased 12s   Social History   Tobacco Use  . Smoking status: Never Smoker  . Smokeless tobacco: Never Used  Substance Use Topics  . Alcohol use: No    Alcohol/week: 0.0 oz   Allergies  Allergen Reactions  . Sulfa Antibiotics Other (See Comments)    Other reaction(s): Kidney Disorder   Prior to Admission medications   Medication Sig Start Date End Date Taking? Authorizing Provider  acetaminophen (TYLENOL) 500 MG tablet Take 500 mg by mouth every  6 (six) hours as needed (for pain.).   Yes [provider]  alendronate (FOSAMAX) 70 MG tablet Take 70 mg by mouth once a week. 04/23/17  Yes [provider]  anastrozole (ARIMIDEX) 1 MG tablet Take 1 tablet (1 mg total) by mouth daily. 02/06/17  Yes [name], MD  aspirin EC 325 MG tablet Take 1 tablet (325 mg total) by mouth 2 (two) times daily. 04/07/17 04/07/18 Yes [name], [name], MD  Camphor-Menthol-Capsicum (TIGER BALM PAIN RELIEVING) 80-24-16 MG PTCH Place 1 patch onto the skin daily as needed (for knee pain.).   Yes [provider]  furosemide (LASIX) 40 MG tablet Take 40 mg by mouth daily.    Yes [provider]  labetalol (NORMODYNE) 200 MG tablet TAKE 1 TABLET (200MG  TOTAL) BY MOUTH 2 (TWO) TIMES DAILY. 08/09/14  Yes [provider]  levothyroxine (SYNTHROID) 88 MCG tablet Take 88 mcg by mouth daily before breakfast.  06/20/14  Yes [provider]  losartan-hydrochlorothiazide (HYZAAR) 100-25 MG tablet Take 1 tablet by mouth daily.   Yes [provider]  mupirocin ointment (BACTROBAN) 2 % Place 1 application into the nose 2 (two) times daily. 04/07/17  Yes [name], [name], MD  octreotide (SANDOSTATIN LAR) 30 MG injection Inject 30 mg into the muscle every 28 (twenty-eight) days.    Yes [provider]  oxyCODONE-acetaminophen (PERCOCET/ROXICET) 5-325 MG tablet Take 1 tablet by mouth every 6 (six) hours as needed for moderate pain. 04/07/17  Yes [name], [name], MD  potassium chloride SA (K-DUR,KLOR-CON) 20 MEQ tablet Take 2 tablets (40 mEq total) by mouth 2 (two) times daily. 09/19/16  Yes [name], FNP  Vitamin D, Ergocalciferol, (DRISDOL) 50000 units CAPS capsule TAKE 1 CAPSULE (50,000 UNITS TOTAL) BY MOUTH ONCE A WEEK. 09/30/16  Yes [name], MD   Review of Systems  Constitutional: Positive for fatigue. Negative for appetite change.  HENT: Positive for hearing loss. Negative for congestion, postnasal drip and sore throat.   Eyes: Negative.   Respiratory: Positive for cough, chest tightness and shortness of breath (sometimes).   Cardiovascular: Positive for leg swelling (little bit left leg). Negative for chest pain and palpitations.  Gastrointestinal: Negative for abdominal distention and abdominal pain.  Endocrine: Negative.   Genitourinary: Negative.   Musculoskeletal: Positive for arthralgias (right leg at times). Negative for back pain.       Right leg with cast from mid thigh down to mid foot   Skin: Negative.   Allergic/Immunologic: Negative.   Neurological: Positive for dizziness (at times). Negative for  light-headedness.  Hematological: Negative for adenopathy. Does not bruise/bleed easily.  Psychiatric/Behavioral: Negative for dysphoric mood, sleep disturbance (sleeping on 1 pillow) and suicidal ideas. The patient is not nervous/anxious.    Vitals:   05/18/17 1301  BP: (!) 130/51  Pulse: 60  Resp: 18  SpO2: 95%  Weight: 208 lb (94.3 kg)  Height: 5\' 3"  (1.6 m)   Wt Readings from Last 3 Encounters:  05/18/17 208 lb (94.3 kg)  04/27/17 208 lb (94.3 kg)  04/07/17 234 lb (106.1 kg)   Lab Results  Component Value Date   CREATININE 0.86 04/27/2017   CREATININE 0.87 04/05/2017   CREATININE 0.92 02/06/2017    Physical Exam  Constitutional: She is oriented to person, place, and time. She appears well-developed and well-nourished.  HENT:  Head: Normocephalic and atraumatic.  Right Ear: Decreased hearing is noted.  Left Ear: Decreased hearing is noted.  Neck: Normal range of motion.  Neck supple. No JVD present.  Cardiovascular: Normal rate and regular rhythm.  Pulmonary/Chest: Effort normal. She has no wheezes. She has no rales.  Abdominal: Soft. She exhibits no distension. There is no tenderness.  Musculoskeletal: She exhibits edema (trace edema in left lower leg). She exhibits no tenderness.  Right leg cast present from mid-thigh to mid-foot  Neurological: She is alert and oriented to person, place, and time.  Skin: Skin is warm and dry.  Psychiatric: She has a normal mood and affect. Her behavior is normal. Thought content normal.  Nursing note and vitals reviewed.  Assessment & Plan:  1: Chronic heart failure with preserved ejection fraction- - NYHA class II - minimal edema in left lower leg; she hasn't taken her diuretic yet today but will take it once she returns home -  Currently not weighing daily as she has a full leg cast on.  - not adding salt to her food. Currently living with her daughter & son-in-law who is doing the shopping/cooking. Does admit to eating saltier  foods at times - saw her cardiologist (Bergholz) 12/24/16  2: HTN- - BP looks good today - last saw her PCP Kary Kos) on 11/25/16 - reviewed BMP drawn on 04/27/17 and showed sodium 135, potassium 3.4 and GFR >60  3: Carcinoid tumor of small intestine- - being followed closely at the cancer center & was last seen 04/27/17  4: Right tib/fib fracture- - currently has full leg cast on - goes to orthopaedics tomorrow  Patient did not bring her medications nor a list. Each medication was verbally reviewed with the patient and she was encouraged to bring the bottles to every visit to confirm accuracy of list.  Return in 3 months or sooner for any questions/problems before then.

## 2017-05-18 ENCOUNTER — Ambulatory Visit: Payer: Medicare Other | Attending: Family | Admitting: Family

## 2017-05-18 ENCOUNTER — Encounter: Payer: Self-pay | Admitting: Family

## 2017-05-18 VITALS — BP 130/51 | HR 60 | Resp 18 | Ht 63.0 in | Wt 208.0 lb

## 2017-05-18 DIAGNOSIS — N189 Chronic kidney disease, unspecified: Secondary | ICD-10-CM | POA: Diagnosis not present

## 2017-05-18 DIAGNOSIS — S82201D Unspecified fracture of shaft of right tibia, subsequent encounter for closed fracture with routine healing: Secondary | ICD-10-CM | POA: Diagnosis not present

## 2017-05-18 DIAGNOSIS — Z90710 Acquired absence of both cervix and uterus: Secondary | ICD-10-CM | POA: Insufficient documentation

## 2017-05-18 DIAGNOSIS — Z803 Family history of malignant neoplasm of breast: Secondary | ICD-10-CM | POA: Diagnosis not present

## 2017-05-18 DIAGNOSIS — Z86718 Personal history of other venous thrombosis and embolism: Secondary | ICD-10-CM | POA: Insufficient documentation

## 2017-05-18 DIAGNOSIS — C7A019 Malignant carcinoid tumor of the small intestine, unspecified portion: Secondary | ICD-10-CM

## 2017-05-18 DIAGNOSIS — Z923 Personal history of irradiation: Secondary | ICD-10-CM | POA: Diagnosis not present

## 2017-05-18 DIAGNOSIS — Z7982 Long term (current) use of aspirin: Secondary | ICD-10-CM | POA: Diagnosis not present

## 2017-05-18 DIAGNOSIS — Z79899 Other long term (current) drug therapy: Secondary | ICD-10-CM | POA: Diagnosis not present

## 2017-05-18 DIAGNOSIS — K219 Gastro-esophageal reflux disease without esophagitis: Secondary | ICD-10-CM | POA: Insufficient documentation

## 2017-05-18 DIAGNOSIS — X58XXXD Exposure to other specified factors, subsequent encounter: Secondary | ICD-10-CM | POA: Diagnosis not present

## 2017-05-18 DIAGNOSIS — E039 Hypothyroidism, unspecified: Secondary | ICD-10-CM | POA: Diagnosis not present

## 2017-05-18 DIAGNOSIS — Z9049 Acquired absence of other specified parts of digestive tract: Secondary | ICD-10-CM | POA: Diagnosis not present

## 2017-05-18 DIAGNOSIS — H9190 Unspecified hearing loss, unspecified ear: Secondary | ICD-10-CM | POA: Diagnosis not present

## 2017-05-18 DIAGNOSIS — C50912 Malignant neoplasm of unspecified site of left female breast: Secondary | ICD-10-CM | POA: Insufficient documentation

## 2017-05-18 DIAGNOSIS — Z7983 Long term (current) use of bisphosphonates: Secondary | ICD-10-CM | POA: Insufficient documentation

## 2017-05-18 DIAGNOSIS — Z882 Allergy status to sulfonamides status: Secondary | ICD-10-CM | POA: Insufficient documentation

## 2017-05-18 DIAGNOSIS — I509 Heart failure, unspecified: Secondary | ICD-10-CM | POA: Diagnosis present

## 2017-05-18 DIAGNOSIS — I130 Hypertensive heart and chronic kidney disease with heart failure and stage 1 through stage 4 chronic kidney disease, or unspecified chronic kidney disease: Secondary | ICD-10-CM | POA: Insufficient documentation

## 2017-05-18 DIAGNOSIS — Z87442 Personal history of urinary calculi: Secondary | ICD-10-CM | POA: Diagnosis not present

## 2017-05-18 DIAGNOSIS — Z79890 Hormone replacement therapy (postmenopausal): Secondary | ICD-10-CM | POA: Insufficient documentation

## 2017-05-18 DIAGNOSIS — S82401D Unspecified fracture of shaft of right fibula, subsequent encounter for closed fracture with routine healing: Secondary | ICD-10-CM | POA: Insufficient documentation

## 2017-05-18 DIAGNOSIS — I5032 Chronic diastolic (congestive) heart failure: Secondary | ICD-10-CM | POA: Diagnosis not present

## 2017-05-18 DIAGNOSIS — I10 Essential (primary) hypertension: Secondary | ICD-10-CM

## 2017-05-18 DIAGNOSIS — Z79811 Long term (current) use of aromatase inhibitors: Secondary | ICD-10-CM | POA: Diagnosis not present

## 2017-05-18 NOTE — Patient Instructions (Signed)
Continue weighing daily and call for an overnight weight gain of > 2 pounds or a weekly weight gain of >5 pounds. 

## 2017-06-01 ENCOUNTER — Inpatient Hospital Stay: Payer: Medicare Other | Attending: Internal Medicine

## 2017-06-01 DIAGNOSIS — E340 Carcinoid syndrome: Secondary | ICD-10-CM | POA: Insufficient documentation

## 2017-06-01 DIAGNOSIS — C7A019 Malignant carcinoid tumor of the small intestine, unspecified portion: Secondary | ICD-10-CM | POA: Insufficient documentation

## 2017-06-01 DIAGNOSIS — K6389 Other specified diseases of intestine: Secondary | ICD-10-CM

## 2017-06-01 MED ORDER — OCTREOTIDE ACETATE 20 MG IM KIT
20.0000 mg | PACK | Freq: Once | INTRAMUSCULAR | Status: AC
  Administered 2017-06-01: 20 mg via INTRAMUSCULAR

## 2017-06-17 ENCOUNTER — Other Ambulatory Visit: Payer: Medicare Other

## 2017-06-23 ENCOUNTER — Ambulatory Visit
Admission: RE | Admit: 2017-06-23 | Discharge: 2017-06-23 | Disposition: A | Discharge status: Home / Self Care | Payer: Medicare Other | Source: Ambulatory Visit | Attending: Orthopedic Surgery | Admitting: Orthopedic Surgery

## 2017-06-23 ENCOUNTER — Other Ambulatory Visit: Payer: Self-pay | Admitting: Orthopedic Surgery

## 2017-06-23 DIAGNOSIS — R609 Edema, unspecified: Secondary | ICD-10-CM | POA: Insufficient documentation

## 2017-06-25 ENCOUNTER — Ambulatory Visit: Payer: Medicare Other

## 2017-06-29 ENCOUNTER — Inpatient Hospital Stay (HOSPITAL_BASED_OUTPATIENT_CLINIC_OR_DEPARTMENT_OTHER): Payer: Medicare Other | Admitting: Internal Medicine

## 2017-06-29 ENCOUNTER — Other Ambulatory Visit: Payer: Self-pay

## 2017-06-29 ENCOUNTER — Inpatient Hospital Stay: Payer: Medicare Other | Attending: Internal Medicine

## 2017-06-29 ENCOUNTER — Encounter: Payer: Self-pay | Admitting: Internal Medicine

## 2017-06-29 ENCOUNTER — Inpatient Hospital Stay: Payer: Medicare Other

## 2017-06-29 VITALS — BP 146/75 | HR 53 | Temp 97.4°F | Resp 16 | Ht 63.0 in

## 2017-06-29 DIAGNOSIS — M8580 Other specified disorders of bone density and structure, unspecified site: Secondary | ICD-10-CM

## 2017-06-29 DIAGNOSIS — C7A019 Malignant carcinoid tumor of the small intestine, unspecified portion: Secondary | ICD-10-CM | POA: Insufficient documentation

## 2017-06-29 DIAGNOSIS — E340 Carcinoid syndrome: Secondary | ICD-10-CM | POA: Diagnosis not present

## 2017-06-29 DIAGNOSIS — C7B02 Secondary carcinoid tumors of liver: Secondary | ICD-10-CM | POA: Diagnosis not present

## 2017-06-29 DIAGNOSIS — C50912 Malignant neoplasm of unspecified site of left female breast: Secondary | ICD-10-CM | POA: Diagnosis not present

## 2017-06-29 DIAGNOSIS — Z79811 Long term (current) use of aromatase inhibitors: Secondary | ICD-10-CM

## 2017-06-29 DIAGNOSIS — Z170 Estrogen receptor positive status [ER+]: Secondary | ICD-10-CM | POA: Insufficient documentation

## 2017-06-29 DIAGNOSIS — D509 Iron deficiency anemia, unspecified: Secondary | ICD-10-CM | POA: Diagnosis not present

## 2017-06-29 DIAGNOSIS — K6389 Other specified diseases of intestine: Secondary | ICD-10-CM

## 2017-06-29 LAB — COMPREHENSIVE METABOLIC PANEL
ALK PHOS: 81 U/L (ref 38–126)
ALT: 14 U/L (ref 14–54)
AST: 28 U/L (ref 15–41)
Albumin: 4.1 g/dL (ref 3.5–5.0)
Anion gap: 11 (ref 5–15)
BILIRUBIN TOTAL: 1 mg/dL (ref 0.3–1.2)
BUN: 16 mg/dL (ref 6–20)
CALCIUM: 9.3 mg/dL (ref 8.9–10.3)
CO2: 22 mmol/L (ref 22–32)
Chloride: 104 mmol/L (ref 101–111)
Creatinine, Ser: 0.77 mg/dL (ref 0.44–1.00)
GFR calc Af Amer: >60 mL/min (ref >60)
GFR calc non Af Amer: >60 mL/min (ref >60)
GLUCOSE: 120 mg/dL — AB (ref 65–99)
POTASSIUM: 3.2 mmol/L — AB (ref 3.5–5.1)
Sodium: 137 mmol/L (ref 135–145)
TOTAL PROTEIN: 7 g/dL (ref 6.5–8.1)

## 2017-06-29 LAB — CBC WITH DIFFERENTIAL/PLATELET
BASOS ABS: 0 10*3/uL (ref 0–0.1)
Basophils Relative: 1 %
Eosinophils Absolute: 0.3 10*3/uL (ref 0–0.7)
Eosinophils Relative: 5 %
HEMATOCRIT: 37.4 % (ref 35.0–47.0)
HEMOGLOBIN: 12.9 g/dL (ref 12.0–16.0)
Lymphocytes Relative: 11 %
Lymphs Abs: 0.6 10*3/uL — ABNORMAL LOW (ref 1.0–3.6)
MCH: 29.9 pg (ref 26.0–34.0)
MCHC: 34.5 g/dL (ref 32.0–36.0)
MCV: 86.7 fL (ref 80.0–100.0)
MONO ABS: 0.7 10*3/uL (ref 0.2–0.9)
Monocytes Relative: 13 %
NEUTROS ABS: 3.8 10*3/uL (ref 1.4–6.5)
Neutrophils Relative %: 70 %
Platelets: 182 10*3/uL (ref 150–440)
RBC: 4.31 MIL/uL (ref 3.80–5.20)
RDW: 14.9 % — AB (ref 11.5–14.5)
WBC: 5.4 10*3/uL (ref 3.6–11.0)

## 2017-06-29 MED ORDER — OCTREOTIDE ACETATE 20 MG IM KIT
20.0000 mg | PACK | Freq: Once | INTRAMUSCULAR | Status: AC
  Administered 2017-06-29: 20 mg via INTRAMUSCULAR
  Filled 2017-06-29: qty 1

## 2017-06-29 NOTE — Progress Notes (Signed)
[facility] OFFICE PROGRESS NOTE  [name], [name] [name], [name] [name], [name] [name], [name]  Cancer Staging No matching staging information was found for the patient.   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   # FEB 2018- Ga PET- uptake in liver/ mesenteric mass  # Jan/FEB 2018- Liver MRI- "fat sparing" Bx- neg; no further wu recom  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] s/p Lumpec & RT; ER/PR > 90%; Her2 Neu-NEG; Arimidex; MAY 2016- Mammo-NEG; May 1st HOLD Arimidex sec to myalgias  # IDA s/p IV iron; last March 2014; [place] [Dr.Elliot; Jan 2016] colon angiotele- s/p Argon laser; April 2017- [facility]  # DVT [taken off eliquis DEC 2017]; NOV -DEC 2017 CHF/ UTI with sepsis- stone extracted [Dr.Brandon]  # Thyroid nodule- [facility] s/p Bx [NOV 2016]/ right adnexal mass [stable since 2010]; hard of hearing  MOLECULAR TESTING- Not done  GENETIC TESTING- [place]- ? Sig;  -------------------------------------------------    DIAGNOSIS: _0  Carcinoid  STAGE: 4       ;GOALS: Palliative  CURRENT/MOST RECENT THERAPY: Monthly Sandostatin      Carcinoid tumor of small intestine, malignant ([place])      INTERVAL HISTORY:  Emma [name] 77 y.o.  female pleasant patient above history of metastatic carcinoid; nonfunctional on Sandostatin is here for follow-up.  Patient had a recent episode of fall fracturing her right ankle.  She continues to be a cast.  Denies any leg swelling.  She is currently staying with her daughter since her  discharge from the hospital.  Review of Systems  Constitutional: Positive for malaise/fatigue. Negative for chills, diaphoresis, fever and weight loss.  HENT: Negative for nosebleeds and sore throat.   Eyes: Negative for double vision.  Respiratory: Negative for cough, hemoptysis, sputum production, shortness of breath and wheezing.   Cardiovascular: Negative for chest pain, palpitations, orthopnea and leg swelling.  Gastrointestinal: Negative for abdominal pain, blood in stool, constipation, diarrhea, heartburn, melena, nausea and vomiting.  Genitourinary: Negative for dysuria, frequency and urgency.  Musculoskeletal: Positive for joint pain. Negative for back pain.  Skin: Negative.  Negative for itching and rash.  Neurological: Negative for dizziness, tingling, focal weakness, weakness and headaches.  Endo/Heme/Allergies: Does not bruise/bleed easily.  Psychiatric/Behavioral: Negative for depression. The patient is not nervous/anxious and does not have insomnia.       PAST MEDICAL HISTORY :  Past Medical History:  Diagnosis Date  . Aromatase inhibitor use   . Breast cancer ([place]) 2014   left breast cancer/radiation  . Breast cancer, left breast ([place]) 06/19/2014  . CHF (congestive heart failure) (HCC)    recent sepsis  . Chronic kidney disease   . Dizziness   . Dyspnea    recently while admitted  . GERD (gastroesophageal reflux disease)   . Hearing loss   . History of hiatal hernia   . History of kidney stones   . HOH (hard of hearing)    severe  . Hypertension   . Hypothyroidism   . Leg DVT (deep venous thromboembolism), acute ([place]) 07/2015   left leg after fracture  . 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  Pain    chest pain while admitted  . Thyroid disease     PAST SURGICAL HISTORY :   Past Surgical History:  Procedure Laterality Date  . ABDOMINAL HYSTERECTOMY     partial  . BREAST EXCISIONAL BIOPSY Left 12/2012   +  . BREAST LUMPECTOMY Left 2014  . BREAST LUMPECTOMY  WITH SENTINEL LYMPH NODE BIOPSY Left 2014  . CHOLECYSTECTOMY    . CYSTOSCOPY W/ URETERAL STENT PLACEMENT Right 01/07/2016   Procedure: CYSTOSCOPY WITH STENT REPLACEMENT;  Surgeon: Hollice Espy, MD;  Location: [facility];  Service: Urology;  Laterality: Right;  . CYSTOSCOPY WITH RETROGRADE PYELOGRAM, URETEROSCOPY AND STENT PLACEMENT Right 12/19/2015   Procedure: CYSTOSCOPY WITH RETROGRADE PYELOGRAM, URETEROSCOPY AND STENT PLACEMENT;  Surgeon: Ardis Hughs, MD;  Location: [facility];  Service: Urology;  Laterality: Right;  . URETEROSCOPY WITH HOLMIUM LASER LITHOTRIPSY Right 01/07/2016   Procedure: URETEROSCOPY WITH HOLMIUM LASER LITHOTRIPSY;  Surgeon: Hollice Espy, MD;  Location: [facility];  Service: Urology;  Laterality: Right;    FAMILY HISTORY :   Family History  Problem Relation Age of Onset  . Breast cancer Mother 32       deceased 36  . Breast cancer Maternal Aunt 68       deceased 24s  . Prostate cancer Father        deceased 1  . Colon cancer Paternal Aunt 80       deceased 44s    SOCIAL HISTORY:   Social History   Tobacco Use  . Smoking status: Never Smoker  . Smokeless tobacco: Never Used  Substance Use Topics  . Alcohol use: No    Alcohol/week: 0.0 oz  . Drug use: No    ALLERGIES:  is allergic to sulfa antibiotics.  MEDICATIONS:  Current Outpatient Medications  Medication Sig Dispense Refill  . acetaminophen (TYLENOL) 500 MG tablet Take 500 mg by mouth every 6 (six) hours as needed (for pain.).    Marland Kitchen alendronate (FOSAMAX) 70 MG tablet Take 70 mg by mouth once a week.  3  . anastrozole (ARIMIDEX) 1 MG tablet Take 1 tablet (1 mg total) by mouth daily. 90 tablet 6  . aspirin EC 325 MG tablet Take 1 tablet (325 mg total) by mouth 2 (two) times daily. 30 tablet 0  . furosemide (LASIX) 40 MG tablet Take 40 mg by mouth daily.    Marland Kitchen labetalol (NORMODYNE) 200 MG tablet TAKE 1 TABLET (200MG TOTAL) BY MOUTH 2 (TWO) TIMES DAILY.    Marland Kitchen levothyroxine (SYNTHROID) 88 MCG  tablet Take 88 mcg by mouth daily before breakfast.     . losartan-hydrochlorothiazide (HYZAAR) 100-25 MG tablet Take 1 tablet by mouth daily.    Marland Kitchen octreotide (SANDOSTATIN LAR) 30 MG injection Inject 30 mg into the muscle every 28 (twenty-eight) days.     . potassium chloride SA (K-DUR,KLOR-CON) 20 MEQ tablet Take 2 tablets (40 mEq total) by mouth 2 (two) times daily. 120 tablet 5  . Vitamin D, Ergocalciferol, (DRISDOL) 50000 units CAPS capsule TAKE 1 CAPSULE (50,000 UNITS TOTAL) BY MOUTH ONCE A WEEK. 24 capsule 1   No current facility-administered medications for this visit.     PHYSICAL EXAMINATION: ECOG PERFORMANCE STATUS: 2 - Symptomatic, <50% confined to bed  BP (!) 146/75 (BP Location: Right Arm, Patient Position: Sitting)   Pulse (!) 53   Temp (!) 97.4 F (36.3 C) (Tympanic)   Resp 16   Ht _0  (1.6 m)   BMI 36.85 kg/m   [employer]  Weights    GENERAL: Well-nourished well-developed; Alert, no distress and comfortable.  Alone.  In a wheelchair.  EYES: no pallor or icterus OROPHARYNX: no thrush or ulceration; NECK: supple; no lymph nodes felt. LYMPH:  no palpable lymphadenopathy in the axillary or inguinal regions LUNGS: Decreased breath sounds auscultation bilaterally. No wheeze or crackles HEART/CVS: regular rate & rhythm and no murmurs; No lower extremity edema; right lower extremity in cast. ABDOMEN:abdomen soft, non-tender and normal bowel sounds. No hepatomegaly or splenomegaly.  Musculoskeletal:no cyanosis of digits and no clubbing  PSYCH: alert & oriented x 3 with fluent speech NEURO: no focal motor/sensory deficits SKIN:  no rashes or significant lesions    LABORATORY DATA:  I have reviewed the data as listed    Component Value Date/Time   NA 137 06/29/2017 1054   NA 137 07/12/2013 0841   K 3.2 (L) 06/29/2017 1054   K 4.7 07/12/2013 0841   CL 104 06/29/2017 1054   CL 107 07/12/2013 0841   CO2 22 06/29/2017 1054   CO2 20 (L) 07/12/2013 0841   GLUCOSE 120 (H)  06/29/2017 1054   GLUCOSE 122 (H) 07/12/2013 0841   BUN 16 06/29/2017 1054   BUN 18 07/12/2013 0841   CREATININE 0.77 06/29/2017 1054   CREATININE 0.64 05/22/2014 1111   CALCIUM 9.3 06/29/2017 1054   CALCIUM 9.2 07/12/2013 0841   PROT 7.0 06/29/2017 1054   PROT 7.0 05/22/2014 1111   ALBUMIN 4.1 06/29/2017 1054   ALBUMIN 4.2 05/22/2014 1111   AST 28 06/29/2017 1054   AST 29 05/22/2014 1111   ALT 14 06/29/2017 1054   ALT 17 05/22/2014 1111   ALKPHOS 81 06/29/2017 1054   ALKPHOS 95 05/22/2014 1111   BILITOT 1.0 06/29/2017 1054   BILITOT 0.8 05/22/2014 1111   GFRNONAA >60 06/29/2017 1054   GFRNONAA >60 05/22/2014 1111   GFRAA >60 06/29/2017 1054   GFRAA >60 05/22/2014 1111    No results found for: SPEP, UPEP  Lab Results  Component Value Date   WBC 5.4 06/29/2017   NEUTROABS 3.8 06/29/2017   HGB 12.9 06/29/2017   HCT 37.4 06/29/2017   MCV 86.7 06/29/2017   PLT 182 06/29/2017      Chemistry      Component Value Date/Time   NA 137 06/29/2017 1054   NA 137 07/12/2013 0841   K 3.2 (L) 06/29/2017 1054   K 4.7 07/12/2013 0841   CL 104 06/29/2017 1054   CL 107 07/12/2013 0841   CO2 22 06/29/2017 1054   CO2 20 (L) 07/12/2013 0841   BUN 16 06/29/2017 1054   BUN 18 07/12/2013 0841   CREATININE 0.77 06/29/2017 1054   CREATININE 0.64 05/22/2014 1111      Component Value Date/Time   CALCIUM 9.3 06/29/2017 1054   CALCIUM 9.2 07/12/2013 0841   ALKPHOS 81 06/29/2017 1054   ALKPHOS 95 05/22/2014 1111   AST 28 06/29/2017 1054   AST 29 05/22/2014 1111   ALT 14 06/29/2017 1054   ALT 17 05/22/2014 1111   BILITOT 1.0 06/29/2017 1054   BILITOT 0.8 05/22/2014 1111       RADIOGRAPHIC STUDIES: I have personally reviewed the radiological images as listed and agreed with the findings in the report. No results found.   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  #History of iron deficiency anemia-secondary AV malformation -hemoglobin-12.9 stable.  #Status post fall right ankle fracture; currently stable.  # Genetic counselling-status post germline testing given history of breast cancer. reviwed the results of MYUTH tetsting=; no futher work up recommdedne.   #Left breast cancer stage I ER PR positive; on Arimidex.  No clinical evidence of recurrence.  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  Continue calcium vitamin D.  Will be candidate for Prolia.   # Follow-up with me in 2 months/labs; labs/ sandostain Shots monthly.   Orders Placed This Encounter  Procedures  . CBC with Differential    Standing Status:   Future    Standing Expiration Date:   06/30/2018  . Comprehensive metabolic panel    Standing Status:   Future    Standing Expiration Date:   06/30/2018   All questions were answered. The patient knows to call the clinic with any problems, questions or concerns.      [name], MD 06/29/2017 11:39 PM

## 2017-06-29 NOTE — Assessment & Plan Note (Addendum)
#   Non- functional Small bowel/mesenteric carcinoid; STAGE IV-on octreotide injections monthly; stable  #Continue octreotide injections on a monthly basis; will repeat scan again in approximately 2 to 3 months; if progression noted would recommend Lutathera.  #History of iron deficiency anemia-secondary AV malformation -hemoglobin-12.9 stable.  #Status post fall right ankle fracture; currently stable.  # Genetic counselling-status post germline testing given history of breast cancer. reviwed the results of MYUTH tetsting=; no futher work up recommdedne.   #Left breast cancer stage I ER PR positive; on Arimidex.  No clinical evidence of recurrence.  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  Continue calcium vitamin D.  Will be candidate for Prolia.   # Follow-up with me in 2 months/labs; labs/ sandostain Shots monthly.

## 2017-06-29 NOTE — Progress Notes (Signed)
Patient has broken leg. Otherwise no changes since last appt.

## 2017-07-27 ENCOUNTER — Inpatient Hospital Stay: Payer: Medicare Other | Attending: Internal Medicine

## 2017-07-27 DIAGNOSIS — C7B02 Secondary carcinoid tumors of liver: Secondary | ICD-10-CM | POA: Diagnosis not present

## 2017-07-27 DIAGNOSIS — C7A019 Malignant carcinoid tumor of the small intestine, unspecified portion: Secondary | ICD-10-CM | POA: Insufficient documentation

## 2017-07-27 DIAGNOSIS — M8580 Other specified disorders of bone density and structure, unspecified site: Secondary | ICD-10-CM | POA: Diagnosis not present

## 2017-07-27 DIAGNOSIS — D500 Iron deficiency anemia secondary to blood loss (chronic): Secondary | ICD-10-CM | POA: Diagnosis not present

## 2017-07-27 DIAGNOSIS — Q2733 Arteriovenous malformation of digestive system vessel: Secondary | ICD-10-CM | POA: Insufficient documentation

## 2017-07-27 DIAGNOSIS — E340 Carcinoid syndrome: Secondary | ICD-10-CM | POA: Insufficient documentation

## 2017-07-27 DIAGNOSIS — K6389 Other specified diseases of intestine: Secondary | ICD-10-CM

## 2017-07-27 DIAGNOSIS — M7989 Other specified soft tissue disorders: Secondary | ICD-10-CM | POA: Insufficient documentation

## 2017-07-27 MED ORDER — OCTREOTIDE ACETATE 20 MG IM KIT
20.0000 mg | PACK | Freq: Once | INTRAMUSCULAR | Status: AC
  Administered 2017-07-27: 20 mg via INTRAMUSCULAR

## 2017-08-06 ENCOUNTER — Ambulatory Visit
Admission: RE | Admit: 2017-08-06 | Discharge: 2017-08-06 | Disposition: A | Discharge status: Home / Self Care | Payer: Medicare Other | Source: Ambulatory Visit | Attending: General Surgery | Admitting: General Surgery

## 2017-08-06 DIAGNOSIS — Z853 Personal history of malignant neoplasm of breast: Secondary | ICD-10-CM | POA: Insufficient documentation

## 2017-08-06 HISTORY — DX: Personal history of irradiation: Z92.3

## 2017-08-18 ENCOUNTER — Ambulatory Visit: Payer: Medicare Other | Admitting: Family

## 2017-08-19 ENCOUNTER — Ambulatory Visit: Payer: Medicare Other | Admitting: Family

## 2017-08-24 ENCOUNTER — Inpatient Hospital Stay (HOSPITAL_BASED_OUTPATIENT_CLINIC_OR_DEPARTMENT_OTHER): Payer: Medicare Other | Admitting: Internal Medicine

## 2017-08-24 ENCOUNTER — Inpatient Hospital Stay: Payer: Medicare Other

## 2017-08-24 VITALS — BP 156/71 | HR 62 | Temp 97.2°F | Resp 18 | Ht 63.0 in | Wt 205.0 lb

## 2017-08-24 DIAGNOSIS — E340 Carcinoid syndrome: Secondary | ICD-10-CM | POA: Diagnosis not present

## 2017-08-24 DIAGNOSIS — M8580 Other specified disorders of bone density and structure, unspecified site: Secondary | ICD-10-CM

## 2017-08-24 DIAGNOSIS — C7A019 Malignant carcinoid tumor of the small intestine, unspecified portion: Secondary | ICD-10-CM

## 2017-08-24 DIAGNOSIS — D500 Iron deficiency anemia secondary to blood loss (chronic): Secondary | ICD-10-CM

## 2017-08-24 DIAGNOSIS — K6389 Other specified diseases of intestine: Secondary | ICD-10-CM

## 2017-08-24 DIAGNOSIS — C7B02 Secondary carcinoid tumors of liver: Secondary | ICD-10-CM | POA: Diagnosis not present

## 2017-08-24 DIAGNOSIS — Q2733 Arteriovenous malformation of digestive system vessel: Secondary | ICD-10-CM

## 2017-08-24 DIAGNOSIS — M7989 Other specified soft tissue disorders: Secondary | ICD-10-CM

## 2017-08-24 LAB — CBC WITH DIFFERENTIAL/PLATELET
Basophils Absolute: 0.1 10*3/uL (ref 0–0.1)
Basophils Relative: 1 %
Eosinophils Absolute: 0.3 10*3/uL (ref 0–0.7)
Eosinophils Relative: 5 %
HCT: 38.2 % (ref 35.0–47.0)
Hemoglobin: 12.9 g/dL (ref 12.0–16.0)
Lymphocytes Relative: 11 %
Lymphs Abs: 0.6 10*3/uL — ABNORMAL LOW (ref 1.0–3.6)
MCH: 29.4 pg (ref 26.0–34.0)
MCHC: 33.6 g/dL (ref 32.0–36.0)
MCV: 87.5 fL (ref 80.0–100.0)
MONOS PCT: 14 %
Monocytes Absolute: 0.8 10*3/uL (ref 0.2–0.9)
NEUTROS ABS: 4 10*3/uL (ref 1.4–6.5)
Neutrophils Relative %: 69 %
Platelets: 203 10*3/uL (ref 150–440)
RBC: 4.37 MIL/uL (ref 3.80–5.20)
RDW: 14.8 % — ABNORMAL HIGH (ref 11.5–14.5)
WBC: 5.8 10*3/uL (ref 3.6–11.0)

## 2017-08-24 LAB — COMPREHENSIVE METABOLIC PANEL
ALT: 14 U/L (ref 0–44)
AST: 22 U/L (ref 15–41)
Albumin: 3.9 g/dL (ref 3.5–5.0)
Alkaline Phosphatase: 81 U/L (ref 38–126)
Anion gap: 11 (ref 5–15)
BUN: 19 mg/dL (ref 8–23)
CHLORIDE: 101 mmol/L (ref 98–111)
CO2: 24 mmol/L (ref 22–32)
Calcium: 9.4 mg/dL (ref 8.9–10.3)
Creatinine, Ser: 0.81 mg/dL (ref 0.44–1.00)
Glucose, Bld: 123 mg/dL — ABNORMAL HIGH (ref 70–99)
POTASSIUM: 3.5 mmol/L (ref 3.5–5.1)
SODIUM: 136 mmol/L (ref 135–145)
Total Bilirubin: 1 mg/dL (ref 0.3–1.2)
Total Protein: 7.3 g/dL (ref 6.5–8.1)

## 2017-08-24 MED ORDER — OCTREOTIDE ACETATE 20 MG IM KIT
20.0000 mg | PACK | Freq: Once | INTRAMUSCULAR | Status: AC
  Administered 2017-08-24: 20 mg via INTRAMUSCULAR
  Filled 2017-08-24: qty 1

## 2017-08-24 NOTE — Assessment & Plan Note (Addendum)
#   Non- functional Small bowel/mesenteric carcinoid; STAGE IV-on octreotide injections monthly; stable  #Continue octreotide injections on a monthly basis; will repeat CT scan in 2 months; if progression noted would recommend Lutathera.  #History of iron deficiency anemia-secondary AV malformation -hemoglobin-12.9 stable  #Status post fall right ankle fracture improved.  #Left breast cancer stage I ER PR positive; on Arimidex.  No clinical evidence of recurrence.  Stable.  Discussed that we will stop Arimidex in summer 2020.  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  Continue calcium vitamin D.  # Follow-up with me in 2 months/labs; CT prior; labs/ sandostain Shots monthly.

## 2017-08-24 NOTE — Progress Notes (Signed)
[facility] OFFICE PROGRESS NOTE  [name], [name] [name], [name] [name], [name] [name], [name]  Cancer Staging No matching staging information was found for the patient.   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 Sig;  -------------------------------------------------    DIAGNOSIS: [ ]  Carcinoid  STAGE: 4       ;GOALS: Palliative  CURRENT/MOST RECENT THERAPY: Monthly Sandostatin      Carcinoid tumor of small intestine, malignant ([place])      INTERVAL HISTORY:  DAYANIS BERGQUIST 77 y.o.  female pleasant patient above history of metastatic carcinoid tumor currently on Sandostatin and also history of early stage breast cancer ER PR positive on Arimidex is here for follow-up.  Patient denies any new diarrhea nausea vomiting abdominal pain.  She complains of chronic mild fatigue.  Complains of  chronic swelling in the legs.  Not any worse.  Review of Systems  Constitutional: Positive for malaise/fatigue. Negative for chills, diaphoresis, fever and weight loss.  HENT: Negative for nosebleeds and sore throat.   Eyes: Negative for double vision.  Respiratory: Negative for cough, hemoptysis, sputum production, shortness of breath and wheezing.   Cardiovascular: Positive for leg swelling. Negative for chest pain, palpitations and orthopnea.  Gastrointestinal: Negative for abdominal pain, blood in stool, constipation, diarrhea, heartburn, melena, nausea and vomiting.  Genitourinary: Negative for dysuria, frequency and urgency.  Musculoskeletal: Negative for back pain and joint pain.  Skin: Negative.  Negative for itching and rash.  Neurological: Negative for dizziness, tingling, focal weakness, weakness and headaches.  Endo/Heme/Allergies: Does not bruise/bleed easily.  Psychiatric/Behavioral: Negative for depression. The patient is not nervous/anxious and does not have insomnia.       PAST MEDICAL HISTORY :  Past Medical History:  Diagnosis Date  . Aromatase inhibitor use   . Breast cancer ([name]) 2014   left breast cancer/radiation  . Breast cancer, left breast ([place]) 06/19/2014  . CHF (congestive heart failure) (H[facility])    recent sepsis  . Chronic kidney disease   . Dizziness   . Dyspnea    recently while admitted  . GERD (gastroesophageal reflux disease)   . Hearing loss   . History of hiatal hernia   . History of kidney stones   . HOH (hard of hearing)    severe  . Hypertension   . Hypothyroidism   . Leg DVT (deep venous thromboembolism), acute ([place]) 07/2015   left leg after fracture  . Mesenteric  mass 06/19/2014  . Pain    chest pain while admitted  . Personal history of radiation therapy 2015   left breast ca  . Thyroid disease     PAST SURGICAL HISTORY :   Past Surgical History:  Procedure Laterality Date  . ABDOMINAL HYSTERECTOMY     partial  . BREAST  LUMPECTOMY Left 01/13/2013   invasive mammary carcinoma, clear margins, LN negative  . BREAST LUMPECTOMY WITH SENTINEL LYMPH NODE BIOPSY Left 2014  . CHOLECYSTECTOMY    . CYSTOSCOPY W/ URETERAL STENT PLACEMENT Right 01/07/2016   Procedure: CYSTOSCOPY WITH STENT REPLACEMENT;  Surgeon: Hollice Espy, MD;  Location: [facility];  Service: Urology;  Laterality: Right;  . CYSTOSCOPY WITH RETROGRADE PYELOGRAM, URETEROSCOPY AND STENT PLACEMENT Right 12/19/2015   Procedure: CYSTOSCOPY WITH RETROGRADE PYELOGRAM, URETEROSCOPY AND STENT PLACEMENT;  Surgeon: Ardis Hughs, MD;  Location: [facility];  Service: Urology;  Laterality: Right;  . URETEROSCOPY WITH HOLMIUM LASER LITHOTRIPSY Right 01/07/2016   Procedure: URETEROSCOPY WITH HOLMIUM LASER LITHOTRIPSY;  Surgeon: Hollice Espy, MD;  Location: [facility];  Service: Urology;  Laterality: Right;    FAMILY HISTORY :   Family History  Problem Relation Age of Onset  . Breast cancer Mother 59       deceased 15  . Breast cancer Maternal Aunt 68       deceased 34s  . Prostate cancer Father        deceased 57  . Colon cancer Paternal Aunt 80       deceased 44s    SOCIAL HISTORY:   Social History   Tobacco Use  . Smoking status: Never Smoker  . Smokeless tobacco: Never Used  Substance Use Topics  . Alcohol use: No    Alcohol/week: 0.0 oz  . Drug use: No    ALLERGIES:  is allergic to sulfa antibiotics.  MEDICATIONS:  Current Outpatient Medications  Medication Sig Dispense Refill  . acetaminophen (TYLENOL) 500 MG tablet Take 500 mg by mouth every 6 (six) hours as needed (for pain.).    Marland Kitchen alendronate (FOSAMAX) 70 MG tablet Take 70 mg by mouth once a week.  3  . anastrozole (ARIMIDEX) 1 MG tablet Take 1 tablet (1 mg total) by mouth daily. 90 tablet 6  . aspirin EC 325 MG tablet Take 1 tablet (325 mg total) by mouth 2 (two) times daily. 30 tablet 0  . furosemide (LASIX) 40 MG tablet Take 40 mg by mouth daily.    Marland Kitchen labetalol (NORMODYNE) 200 MG  tablet TAKE 1 TABLET (200MG TOTAL) BY MOUTH 2 (TWO) TIMES DAILY.    Marland Kitchen levothyroxine (SYNTHROID) 88 MCG tablet Take 88 mcg by mouth daily before breakfast.     . losartan-hydrochlorothiazide (HYZAAR) 100-25 MG tablet Take 1 tablet by mouth daily.    Marland Kitchen octreotide (SANDOSTATIN LAR) 30 MG injection Inject 30 mg into the muscle every 28 (twenty-eight) days.     . potassium chloride SA (K-DUR,KLOR-CON) 20 MEQ tablet Take 2 tablets (40 mEq total) by mouth 2 (two) times daily. 120 tablet 5  . Vitamin D, Ergocalciferol, (DRISDOL) 50000 units CAPS capsule TAKE 1 CAPSULE (50,000 UNITS TOTAL) BY MOUTH ONCE A WEEK. 24 capsule 1   No current facility-administered medications for this visit.     PHYSICAL EXAMINATION: ECOG PERFORMANCE STATUS: 1 - Symptomatic but completely ambulatory  BP (!) 156/71   Pulse 62   Temp (!) 97.2 F (36.2 C) (Tympanic)   Resp 18   Ht 5' 3"  (1.6 m)  Wt 205 lb (93 kg)   BMI 36.31 kg/m   Filed Weights   08/24/17 1031  Weight: 205 lb (93 kg)    GENERAL: Well-nourished well-developed; Alert, no distress and comfortable.  Alone.  She is in a wheelchair. EYES: no pallor or icterus OROPHARYNX: no thrush or ulceration; NECK: supple; no lymph nodes felt. LYMPH:  no palpable lymphadenopathy in the axillary or inguinal regions LUNGS: Decreased breath sounds auscultation bilaterally. No wheeze or crackles HEART/CVS: regular rate & rhythm and no murmurs; 1+ lower extremity edema ABDOMEN:abdomen soft, non-tender and normal bowel sounds. No hepatomegaly or splenomegaly.  Musculoskeletal:no cyanosis of digits and no clubbing  PSYCH: alert & oriented x 3 with fluent speech NEURO: no focal motor/sensory deficits SKIN:  no rashes or significant lesions    LABORATORY DATA:  I have reviewed the data as listed    Component Value Date/Time   NA 136 08/24/2017 1000   NA 137 07/12/2013 0841   K 3.5 08/24/2017 1000   K 4.7 07/12/2013 0841   CL 101 08/24/2017 1000   CL 107  07/12/2013 0841   CO2 24 08/24/2017 1000   CO2 20 (L) 07/12/2013 0841   GLUCOSE 123 (H) 08/24/2017 1000   GLUCOSE 122 (H) 07/12/2013 0841   BUN 19 08/24/2017 1000   BUN 18 07/12/2013 0841   CREATININE 0.81 08/24/2017 1000   CREATININE 0.64 05/22/2014 1111   CALCIUM 9.4 08/24/2017 1000   CALCIUM 9.2 07/12/2013 0841   PROT 7.3 08/24/2017 1000   PROT 7.0 05/22/2014 1111   ALBUMIN 3.9 08/24/2017 1000   ALBUMIN 4.2 05/22/2014 1111   AST 22 08/24/2017 1000   AST 29 05/22/2014 1111   ALT 14 08/24/2017 1000   ALT 17 05/22/2014 1111   ALKPHOS 81 08/24/2017 1000   ALKPHOS 95 05/22/2014 1111   BILITOT 1.0 08/24/2017 1000   BILITOT 0.8 05/22/2014 1111   GFRNONAA >60 08/24/2017 1000   GFRNONAA >60 05/22/2014 1111   GFRAA >60 08/24/2017 1000   GFRAA >60 05/22/2014 1111    No results found for: SPEP, UPEP  Lab Results  Component Value Date   WBC 5.8 08/24/2017   NEUTROABS 4.0 08/24/2017   HGB 12.9 08/24/2017   HCT 38.2 08/24/2017   MCV 87.5 08/24/2017   PLT 203 08/24/2017      Chemistry      Component Value Date/Time   NA 136 08/24/2017 1000   NA 137 07/12/2013 0841   K 3.5 08/24/2017 1000   K 4.7 07/12/2013 0841   CL 101 08/24/2017 1000   CL 107 07/12/2013 0841   CO2 24 08/24/2017 1000   CO2 20 (L) 07/12/2013 0841   BUN 19 08/24/2017 1000   BUN 18 07/12/2013 0841   CREATININE 0.81 08/24/2017 1000   CREATININE 0.64 05/22/2014 1111      Component Value Date/Time   CALCIUM 9.4 08/24/2017 1000   CALCIUM 9.2 07/12/2013 0841   ALKPHOS 81 08/24/2017 1000   ALKPHOS 95 05/22/2014 1111   AST 22 08/24/2017 1000   AST 29 05/22/2014 1111   ALT 14 08/24/2017 1000   ALT 17 05/22/2014 1111   BILITOT 1.0 08/24/2017 1000   BILITOT 0.8 05/22/2014 1111       RADIOGRAPHIC STUDIES: I have personally reviewed the radiological images as listed and agreed with the findings in the report. No results found.   ASSESSMENT & PLAN:  Carcinoid tumor of small intestine, malignant  ([place]) # Non- functional Small bowel/mesenteric carcinoid; STAGE IV-on octreotide  injections monthly; stable  #Continue octreotide injections on a monthly basis; will repeat CT scan in 2 months; if progression noted would recommend Lutathera.  #History of iron deficiency anemia-secondary AV malformation -hemoglobin-12.9 stable  #Status post fall right ankle fracture improved.  #Left breast cancer stage I ER PR positive; on Arimidex.  No clinical evidence of recurrence.  Stable.  Discussed that we will stop Arimidex in summer 2020.  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  Continue calcium vitamin D.  # Follow-up with me in 2 months/labs; CT prior; labs/ sandostain Shots monthly.   Orders Placed This Encounter  Procedures  . CT Abdomen Pelvis W Contrast    Standing Status:   Future    Standing Expiration Date:   08/24/2018    Order Specific Question:   ** REASON FOR EXAM (FREE TEXT)    Answer:   carcinoid tumor on sandostatin; liver mets    Order Specific Question:   If indicated for the ordered procedure, I authorize the administration of contrast media per Radiology protocol    Answer:   Yes    Order Specific Question:   Preferred imaging location?    Answer:   [facility]    Order Specific Question:   Is Oral Contrast requested for this exam?    Answer:   Yes, Per Radiology protocol    Order Specific Question:   Radiology Contrast Protocol - do NOT remove file path    Answer:   [email or web address]   All questions were answered. The patient knows to call the clinic with any problems, questions or concerns.      [name], MD 08/25/2017 11:11 PM

## 2017-08-31 ENCOUNTER — Ambulatory Visit: Payer: Medicare Other | Admitting: Family

## 2017-09-09 NOTE — Progress Notes (Signed)
Patient ID: Emma [name], female    DOB: 01-29-1940, 77 y.o.   MRN: [id number]  HPI  Ms Emma [name] is a 77 y/o female with a history of hypothyroidism, DVT, HTN, HOH, kidney stones, hiatal hernia, GERD, CKD, left breast cancer, sepsis and chronic heart failure.  Last echo was done 12/25/15 which showed an EF of 50-55% without any valvular regurgitation.   Admitted 04/05/17 due to right tib/fib fracture. Orthopaedics consulted and splint applied. Discharged after 2 days.   She presents today for her follow-up visit with a chief complaint of minimal fatigue upon moderate exertion. She says that this has been present for several years. She has associated hearing loss, cough and dizziness along with this. She denies any difficulty sleeping, abdominal distention, palpitations, pedal edema, chest pain, shortness of breath or weight gain.   Past Medical History:  Diagnosis Date  . Aromatase inhibitor use   . Breast cancer ([place]ma [name]) 2014   left breast cancer/radiation  . Breast cancer, left breast (Emma [name]) 06/19/2014  . CHF (congestive heart failure) (H[facility])    recent sepsis  . Chronic kidney disease   . Dizziness   . Dyspnea    recently while admitted  . GERD (gastroesophageal reflux disease)   . Hearing loss   . History of hiatal hernia   . History of kidney stones   . HOH (hard of hearing)    severe  . Hypertension   . Hypothyroidism   . Leg DVT (deep venous thromboembolism), acute ([place]) 07/2015   left leg after fracture  . 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 Pain    chest pain while admitted  . Personal history of radiation therapy 2015   left breast ca  . Thyroid disease    Past Surgical History:  Procedure Laterality Date  . ABDOMINAL HYSTERECTOMY     partial  . BREAST LUMPECTOMY Left 01/13/2013   invasive mammary carcinoma, clear margins, LN negative  . BREAST LUMPECTOMY WITH SENTINEL LYMPH NODE BIOPSY Left 2014  . CHOLECYSTECTOMY    . CYSTOSCOPY W/ URETERAL STENT PLACEMENT Right  01/07/2016   Procedure: CYSTOSCOPY WITH STENT REPLACEMENT;  Surgeon: Hollice Espy, MD;  Location: [facility];  Service: Urology;  Laterality: Right;  . CYSTOSCOPY WITH RETROGRADE PYELOGRAM, URETEROSCOPY AND STENT PLACEMENT Right 12/19/2015   Procedure: CYSTOSCOPY WITH RETROGRADE PYELOGRAM, URETEROSCOPY AND STENT PLACEMENT;  Surgeon: Ardis Hughs, MD;  Location: [facility];  Service: Urology;  Laterality: Right;  . URETEROSCOPY WITH HOLMIUM LASER LITHOTRIPSY Right 01/07/2016   Procedure: URETEROSCOPY WITH HOLMIUM LASER LITHOTRIPSY;  Surgeon: Hollice Espy, MD;  Location: [facility];  Service: Urology;  Laterality: Right;   Family History  Problem Relation Age of Onset  . Breast cancer Mother 33       deceased 75  . Breast cancer Maternal Aunt 68       deceased 49s  . Prostate cancer Father        deceased 50  . Colon cancer Paternal Aunt 80       deceased 44s   Social History   Tobacco Use  . Smoking status: Never Smoker  . Smokeless tobacco: Never Used  Substance Use Topics  . Alcohol use: No    Alcohol/week: 0.0 standard drinks   Allergies  Allergen Reactions  . Sulfa Antibiotics Other (See Comments)    Other reaction(s): Kidney Disorder   Prior to Admission medications   Medication Sig Start Date End Date Taking? Authorizing Provider  acetaminophen (TYLENOL) 500 MG tablet Take 500  mg by mouth every 6 (six) hours as needed (for pain.).   Yes [provider]  alendronate (FOSAMAX) 70 MG tablet Take 70 mg by mouth once a week. 04/23/17  Yes [provider]  anastrozole (ARIMIDEX) 1 MG tablet Take 1 tablet (1 mg total) by mouth daily. 02/06/17  Yes [name], MD  aspirin EC 325 MG tablet Take 1 tablet (325 mg total) by mouth 2 (two) times daily. 04/07/17 04/07/18 Yes [name], [name], MD  furosemide (LASIX) 40 MG tablet Take 40 mg by mouth daily.   Yes [provider]  labetalol (NORMODYNE) 200 MG tablet TAKE 1 TABLET (200MG  TOTAL) BY MOUTH 2  (TWO) TIMES DAILY. 08/09/14  Yes [provider]  levothyroxine (SYNTHROID) 88 MCG tablet Take 88 mcg by mouth daily before breakfast.  06/20/14  Yes [provider]  losartan-hydrochlorothiazide (HYZAAR) 100-25 MG tablet Take 1 tablet by mouth daily.   Yes [provider]  octreotide (SANDOSTATIN LAR) 30 MG injection Inject 30 mg into the muscle every 28 (twenty-eight) days.    Yes [provider]  potassium chloride SA (K-DUR,KLOR-CON) 20 MEQ tablet Take 2 tablets (40 mEq total) by mouth 2 (two) times daily. 09/19/16  Yes Courney Garrod A, FNP  Vitamin D, Ergocalciferol, (DRISDOL) 50000 units CAPS capsule TAKE 1 CAPSULE (50,000 UNITS TOTAL) BY MOUTH ONCE A WEEK. 09/30/16  Yes [name], MD    Review of Systems  Constitutional: Positive for fatigue. Negative for appetite change.  HENT: Positive for hearing loss. Negative for congestion, postnasal drip and sore throat.   Eyes: Negative.   Respiratory: Positive for cough. Negative for chest tightness and shortness of breath.   Cardiovascular: Negative for chest pain, palpitations and leg swelling.  Gastrointestinal: Negative for abdominal distention and abdominal pain.  Endocrine: Negative.   Genitourinary: Negative.   Musculoskeletal: Positive for arthralgias (both knees). Negative for back pain.  Skin: Negative.   Allergic/Immunologic: Negative.   Neurological: Positive for dizziness (at times). Negative for light-headedness.  Hematological: Negative for adenopathy. Does not bruise/bleed easily.  Psychiatric/Behavioral: Negative for dysphoric mood, sleep disturbance (sleeping on 1 pillow) and suicidal ideas. The patient is not nervous/anxious.    Vitals:   09/10/17 1302  BP: 140/70  Pulse: (!) 56  Resp: 18  SpO2: 96%  Weight: 210 lb (95.3 kg)  Height: 5\' 3"  (1.6 m)   Wt Readings from Last 3 Encounters:  09/10/17 210 lb (95.3 kg)  08/24/17 205 lb (93 kg)  05/18/17 208 lb (94.3 kg)   Lab  Results  Component Value Date   CREATININE 0.81 08/24/2017   CREATININE 0.77 06/29/2017   CREATININE 0.86 04/27/2017    Physical Exam  Constitutional: She is oriented to person, place, and time. She appears well-developed and well-nourished.  HENT:  Head: Normocephalic and atraumatic.  Right Ear: Decreased hearing is noted.  Left Ear: Decreased hearing is noted.  Neck: Normal range of motion. Neck supple. No JVD present.  Cardiovascular: Normal rate and regular rhythm.  Pulmonary/Chest: Effort normal. She has no wheezes. She has no rales.  Abdominal: Soft. She exhibits no distension. There is no tenderness.  Musculoskeletal: She exhibits no edema or tenderness.  Neurological: She is alert and oriented to person, place, and time.  Skin: Skin is warm and dry.  Psychiatric: She has a normal mood and affect. Her behavior is normal. Thought content normal.  Nursing note and vitals reviewed.  Assessment & Plan:  1: Chronic heart failure with preserved  ejection fraction- - NYHA class II - euvolemic today - weighing daily and she says that her weight has been stable. Reminded to call for an overnight weight gain of >2 pounds or a weekly weight gain of >5 pounds - not adding salt to her food. Currently living with her daughter & son-in-law who is doing the shopping/cooking. Does admit to eating saltier foods at times - saw her cardiologist ([place]) 06/25/17  2: HTN- - BP looks good today - last saw her PCP Kary Kos) on 05/26/17 - reviewed BMP drawn on 08/24/17 and showed sodium 136, potassium 3.5 and GFR >60  3: Carcinoid tumor of small intestine- - being followed closely at the cancer center & was last seen 08/24/17  Patient did not bring her medications nor a list. Each medication was verbally reviewed with the patient and she was encouraged to bring the bottles to every visit to confirm accuracy of list.  Will not make a return appointment for patient at this time. Advised her that  she could call back at anytime to make another appointment.

## 2017-09-10 ENCOUNTER — Encounter: Payer: Self-pay | Admitting: Family

## 2017-09-10 ENCOUNTER — Ambulatory Visit: Payer: Medicare Other | Attending: Family | Admitting: Family

## 2017-09-10 VITALS — BP 140/70 | HR 56 | Resp 18 | Ht 63.0 in | Wt 210.0 lb

## 2017-09-10 DIAGNOSIS — D490 Neoplasm of unspecified behavior of digestive system: Secondary | ICD-10-CM | POA: Insufficient documentation

## 2017-09-10 DIAGNOSIS — I130 Hypertensive heart and chronic kidney disease with heart failure and stage 1 through stage 4 chronic kidney disease, or unspecified chronic kidney disease: Secondary | ICD-10-CM | POA: Diagnosis not present

## 2017-09-10 DIAGNOSIS — Z7982 Long term (current) use of aspirin: Secondary | ICD-10-CM | POA: Diagnosis not present

## 2017-09-10 DIAGNOSIS — Z9049 Acquired absence of other specified parts of digestive tract: Secondary | ICD-10-CM | POA: Diagnosis not present

## 2017-09-10 DIAGNOSIS — I5032 Chronic diastolic (congestive) heart failure: Secondary | ICD-10-CM | POA: Diagnosis not present

## 2017-09-10 DIAGNOSIS — Z79899 Other long term (current) drug therapy: Secondary | ICD-10-CM | POA: Diagnosis not present

## 2017-09-10 DIAGNOSIS — Z853 Personal history of malignant neoplasm of breast: Secondary | ICD-10-CM | POA: Insufficient documentation

## 2017-09-10 DIAGNOSIS — Z79890 Hormone replacement therapy (postmenopausal): Secondary | ICD-10-CM | POA: Insufficient documentation

## 2017-09-10 DIAGNOSIS — K219 Gastro-esophageal reflux disease without esophagitis: Secondary | ICD-10-CM | POA: Insufficient documentation

## 2017-09-10 DIAGNOSIS — Z09 Encounter for follow-up examination after completed treatment for conditions other than malignant neoplasm: Secondary | ICD-10-CM | POA: Insufficient documentation

## 2017-09-10 DIAGNOSIS — N189 Chronic kidney disease, unspecified: Secondary | ICD-10-CM | POA: Diagnosis not present

## 2017-09-10 DIAGNOSIS — Z800 Family history of malignant neoplasm of digestive organs: Secondary | ICD-10-CM | POA: Diagnosis not present

## 2017-09-10 DIAGNOSIS — E039 Hypothyroidism, unspecified: Secondary | ICD-10-CM | POA: Diagnosis not present

## 2017-09-10 DIAGNOSIS — Z9889 Other specified postprocedural states: Secondary | ICD-10-CM | POA: Insufficient documentation

## 2017-09-10 DIAGNOSIS — Z882 Allergy status to sulfonamides status: Secondary | ICD-10-CM | POA: Diagnosis not present

## 2017-09-10 DIAGNOSIS — Z803 Family history of malignant neoplasm of breast: Secondary | ICD-10-CM | POA: Insufficient documentation

## 2017-09-10 DIAGNOSIS — C7A019 Malignant carcinoid tumor of the small intestine, unspecified portion: Secondary | ICD-10-CM

## 2017-09-10 DIAGNOSIS — I10 Essential (primary) hypertension: Secondary | ICD-10-CM

## 2017-09-10 NOTE — Patient Instructions (Signed)
Continue weighing daily and call for an overnight weight gain of > 2 pounds or a weekly weight gain of >5 pounds. 

## 2017-09-15 ENCOUNTER — Inpatient Hospital Stay
Admission: EM | Admit: 2017-09-15 | Discharge: 2017-09-17 | DRG: 378 | Disposition: A | Discharge status: Home / Self Care | Payer: Medicare Other | Attending: Internal Medicine | Admitting: Internal Medicine

## 2017-09-15 ENCOUNTER — Encounter: Payer: Self-pay | Admitting: Internal Medicine

## 2017-09-15 ENCOUNTER — Other Ambulatory Visit: Payer: Self-pay

## 2017-09-15 DIAGNOSIS — F329 Major depressive disorder, single episode, unspecified: Secondary | ICD-10-CM | POA: Diagnosis present

## 2017-09-15 DIAGNOSIS — C787 Secondary malignant neoplasm of liver and intrahepatic bile duct: Secondary | ICD-10-CM | POA: Diagnosis present

## 2017-09-15 DIAGNOSIS — K922 Gastrointestinal hemorrhage, unspecified: Secondary | ICD-10-CM | POA: Diagnosis present

## 2017-09-15 DIAGNOSIS — I739 Peripheral vascular disease, unspecified: Secondary | ICD-10-CM | POA: Diagnosis present

## 2017-09-15 DIAGNOSIS — K921 Melena: Principal | ICD-10-CM | POA: Diagnosis present

## 2017-09-15 DIAGNOSIS — K625 Hemorrhage of anus and rectum: Secondary | ICD-10-CM

## 2017-09-15 DIAGNOSIS — K219 Gastro-esophageal reflux disease without esophagitis: Secondary | ICD-10-CM | POA: Diagnosis present

## 2017-09-15 DIAGNOSIS — H9190 Unspecified hearing loss, unspecified ear: Secondary | ICD-10-CM | POA: Diagnosis present

## 2017-09-15 DIAGNOSIS — I5030 Unspecified diastolic (congestive) heart failure: Secondary | ICD-10-CM | POA: Diagnosis present

## 2017-09-15 DIAGNOSIS — Z800 Family history of malignant neoplasm of digestive organs: Secondary | ICD-10-CM

## 2017-09-15 DIAGNOSIS — Z79899 Other long term (current) drug therapy: Secondary | ICD-10-CM

## 2017-09-15 DIAGNOSIS — Z79811 Long term (current) use of aromatase inhibitors: Secondary | ICD-10-CM

## 2017-09-15 DIAGNOSIS — B368 Other specified superficial mycoses: Secondary | ICD-10-CM | POA: Diagnosis present

## 2017-09-15 DIAGNOSIS — Z79890 Hormone replacement therapy (postmenopausal): Secondary | ICD-10-CM

## 2017-09-15 DIAGNOSIS — Z853 Personal history of malignant neoplasm of breast: Secondary | ICD-10-CM

## 2017-09-15 DIAGNOSIS — D509 Iron deficiency anemia, unspecified: Secondary | ICD-10-CM | POA: Diagnosis present

## 2017-09-15 DIAGNOSIS — Z7982 Long term (current) use of aspirin: Secondary | ICD-10-CM

## 2017-09-15 DIAGNOSIS — Z90710 Acquired absence of both cervix and uterus: Secondary | ICD-10-CM

## 2017-09-15 DIAGNOSIS — Z85060 Personal history of malignant carcinoid tumor of small intestine: Secondary | ICD-10-CM

## 2017-09-15 DIAGNOSIS — K640 First degree hemorrhoids: Secondary | ICD-10-CM | POA: Diagnosis present

## 2017-09-15 DIAGNOSIS — E876 Hypokalemia: Secondary | ICD-10-CM | POA: Diagnosis present

## 2017-09-15 DIAGNOSIS — E039 Hypothyroidism, unspecified: Secondary | ICD-10-CM | POA: Diagnosis present

## 2017-09-15 DIAGNOSIS — D120 Benign neoplasm of cecum: Secondary | ICD-10-CM | POA: Diagnosis present

## 2017-09-15 DIAGNOSIS — I110 Hypertensive heart disease with heart failure: Secondary | ICD-10-CM | POA: Diagnosis present

## 2017-09-15 DIAGNOSIS — Z803 Family history of malignant neoplasm of breast: Secondary | ICD-10-CM

## 2017-09-15 DIAGNOSIS — C7A019 Malignant carcinoid tumor of the small intestine, unspecified portion: Secondary | ICD-10-CM

## 2017-09-15 DIAGNOSIS — E340 Carcinoid syndrome: Secondary | ICD-10-CM | POA: Diagnosis present

## 2017-09-15 DIAGNOSIS — Z923 Personal history of irradiation: Secondary | ICD-10-CM

## 2017-09-15 DIAGNOSIS — Z86718 Personal history of other venous thrombosis and embolism: Secondary | ICD-10-CM

## 2017-09-15 LAB — COMPREHENSIVE METABOLIC PANEL
ALBUMIN: 3.6 g/dL (ref 3.5–5.0)
ALK PHOS: 68 U/L (ref 38–126)
ALT: 14 U/L (ref 0–44)
ANION GAP: 8 (ref 5–15)
AST: 27 U/L (ref 15–41)
BILIRUBIN TOTAL: 1 mg/dL (ref 0.3–1.2)
BUN: 20 mg/dL (ref 8–23)
CALCIUM: 9 mg/dL (ref 8.9–10.3)
CO2: 22 mmol/L (ref 22–32)
CREATININE: 0.81 mg/dL (ref 0.44–1.00)
Chloride: 104 mmol/L (ref 98–111)
GFR calc Af Amer: >60 mL/min (ref >60)
GFR calc non Af Amer: >60 mL/min (ref >60)
GLUCOSE: 126 mg/dL — AB (ref 70–99)
Potassium: 3.4 mmol/L — ABNORMAL LOW (ref 3.5–5.1)
Sodium: 134 mmol/L — ABNORMAL LOW (ref 135–145)
Total Protein: 6.4 g/dL — ABNORMAL LOW (ref 6.5–8.1)

## 2017-09-15 LAB — CBC
HCT: 36.4 % (ref 35.0–47.0)
Hemoglobin: 12.5 g/dL (ref 12.0–16.0)
MCH: 29.8 pg (ref 26.0–34.0)
MCHC: 34.4 g/dL (ref 32.0–36.0)
MCV: 86.8 fL (ref 80.0–100.0)
Platelets: 171 10*3/uL (ref 150–440)
RBC: 4.2 MIL/uL (ref 3.80–5.20)
RDW: 15 % — AB (ref 11.5–14.5)
WBC: 10.9 10*3/uL (ref 3.6–11.0)

## 2017-09-15 LAB — PROTIME-INR
INR: 0.93
Prothrombin Time: 12.4 seconds (ref 11.4–15.2)

## 2017-09-15 LAB — TYPE AND SCREEN
ABO/RH(D): O POS
ANTIBODY SCREEN: NEGATIVE

## 2017-09-15 MED ORDER — NYSTATIN 100000 UNIT/GM EX POWD
Freq: Three times a day (TID) | CUTANEOUS | Status: DC
  Administered 2017-09-15 – 2017-09-17 (×6): via TOPICAL
  Filled 2017-09-15: qty 15

## 2017-09-15 MED ORDER — ACETAMINOPHEN 650 MG RE SUPP: 650.0000 mg | Freq: Four times a day (QID) | RECTAL | Status: DC | PRN

## 2017-09-15 MED ORDER — LOSARTAN POTASSIUM 50 MG PO TABS
100.0000 mg | ORAL_TABLET | Freq: Every day | ORAL | Status: DC
  Administered 2017-09-15: 19:00:00 100 mg via ORAL
  Filled 2017-09-15 (×2): qty 2

## 2017-09-15 MED ORDER — LABETALOL HCL 200 MG PO TABS
200.0000 mg | ORAL_TABLET | Freq: Two times a day (BID) | ORAL | Status: DC
  Administered 2017-09-16 (×2): 200 mg via ORAL
  Filled 2017-09-15 (×5): qty 1

## 2017-09-15 MED ORDER — ANASTROZOLE 1 MG PO TABS
1.0000 mg | ORAL_TABLET | Freq: Every day | ORAL | Status: DC
  Administered 2017-09-16 – 2017-09-17 (×2): 1 mg via ORAL
  Filled 2017-09-15 (×2): qty 1

## 2017-09-15 MED ORDER — HYDROCHLOROTHIAZIDE 25 MG PO TABS
25.0000 mg | ORAL_TABLET | Freq: Every day | ORAL | Status: DC
  Administered 2017-09-15: 19:00:00 25 mg via ORAL
  Filled 2017-09-15 (×2): qty 1

## 2017-09-15 MED ORDER — POTASSIUM CHLORIDE CRYS ER 20 MEQ PO TBCR
40.0000 meq | EXTENDED_RELEASE_TABLET | Freq: Two times a day (BID) | ORAL | Status: DC
  Administered 2017-09-15 – 2017-09-16 (×3): 40 meq via ORAL
  Filled 2017-09-15 (×3): qty 2

## 2017-09-15 MED ORDER — ONDANSETRON HCL 4 MG PO TABS: 4.0000 mg | ORAL_TABLET | Freq: Four times a day (QID) | ORAL | Status: DC | PRN

## 2017-09-15 MED ORDER — ACETAMINOPHEN 325 MG PO TABS: 650.0000 mg | ORAL_TABLET | Freq: Four times a day (QID) | ORAL | Status: DC | PRN

## 2017-09-15 MED ORDER — ACETAMINOPHEN 500 MG PO TABS: 500.0000 mg | ORAL_TABLET | Freq: Four times a day (QID) | ORAL | Status: DC | PRN

## 2017-09-15 MED ORDER — ONDANSETRON HCL 4 MG/2ML IJ SOLN: 4.0000 mg | Freq: Four times a day (QID) | INTRAMUSCULAR | Status: DC | PRN

## 2017-09-15 MED ORDER — OXYCODONE HCL 5 MG PO TABS: 5.0000 mg | ORAL_TABLET | Freq: Four times a day (QID) | ORAL | Status: DC | PRN

## 2017-09-15 MED ORDER — LOSARTAN POTASSIUM-HCTZ 100-25 MG PO TABS: 1.0000 | ORAL_TABLET | Freq: Every day | ORAL | Status: DC

## 2017-09-15 MED ORDER — CITALOPRAM HYDROBROMIDE 20 MG PO TABS
10.0000 mg | ORAL_TABLET | Freq: Every day | ORAL | Status: DC
  Filled 2017-09-15: qty 1

## 2017-09-15 MED ORDER — LEVOTHYROXINE SODIUM 88 MCG PO TABS
88.0000 ug | ORAL_TABLET | Freq: Every day | ORAL | Status: DC
  Filled 2017-09-15 (×2): qty 1

## 2017-09-15 NOTE — H&P (Signed)
[facility] NAME: Emma [name]    MR#:  [id number]  DATE OF BIRTH:  [birth year]  DATE OF ADMISSION:  09/15/2017  PRIMARY CARE PHYSICIAN: [name], MD   REQUESTING/REFERRING PHYSICIAN: Dr. Merlyn Lot  CHIEF COMPLAINT:   Chief Complaint  Patient presents with  . Rectal Bleeding  . Abdominal Pain    HISTORY OF PRESENT ILLNESS:  Emma [name]  is a 77 y.o. female with a known history of small bowel carcinoid tumor, rectal bleeding.  She states he has been passing blood every bowel movement for a few weeks.  Bright red blood per rectum with blood clots are dark red.  She states that she always has diarrhea.  She states that her cheeks get flushed.  Daughter states that she had a colonoscopy a couple years ago by Dr. Vira Agar and he cauterized something in the colon.  I am unable to get the colonoscopy report.  Hospitalist service was were contacted for further evaluation.  Hemoglobin similar to previous hemoglobins.  PAST MEDICAL HISTORY:   Past Medical History:  Diagnosis Date  . Aromatase inhibitor use   . Breast cancer ([place]) 2014   left breast cancer/radiation  . Breast cancer, left breast ([place]) 06/19/2014  . CHF (congestive heart failure) (HCC)    recent sepsis  . Chronic kidney disease   . Dizziness   . Dyspnea    recently while admitted  . GERD (gastroesophageal reflux disease)   . Hearing loss   . History of hiatal hernia   . History of kidney stones   . HOH (hard of hearing)    severe  . Hypertension   . Hypothyroidism   . Leg DVT (deep venous thromboembolism), acute ([place]) 07/2015   left leg after fracture  . 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 Pain    chest pain while admitted  . Personal history of radiation therapy 2015   left breast ca  . Thyroid disease     PAST SURGICAL HISTORY:   Past Surgical History:  Procedure Laterality Date  . ABDOMINAL HYSTERECTOMY     partial  . BREAST  LUMPECTOMY Left 01/13/2013   invasive mammary carcinoma, clear margins, LN negative  . BREAST LUMPECTOMY WITH SENTINEL LYMPH NODE BIOPSY Left 2014  . CHOLECYSTECTOMY    . CYSTOSCOPY W/ URETERAL STENT PLACEMENT Right 01/07/2016   Procedure: CYSTOSCOPY WITH STENT REPLACEMENT;  Surgeon: Hollice Espy, MD;  Location: [facility] ORS;  Service: Urology;  Laterality: Right;  . CYSTOSCOPY WITH RETROGRADE PYELOGRAM, URETEROSCOPY AND STENT PLACEMENT Right 12/19/2015   Procedure: CYSTOSCOPY WITH RETROGRADE PYELOGRAM, URETEROSCOPY AND STENT PLACEMENT;  Surgeon: Ardis Hughs, MD;  Location: [facility] OR[facility];  Service: Urology;  Laterality: Right;  . URETEROSCOPY WITH HOLMIUM LASER LITHOTRIPSY Right 01/07/2016   Procedure: URETEROSCOPY WITH HOLMIUM LASER LITHOTRIPSY;  Surgeon: Hollice Espy, MD;  Location: [facility] OR[facility];  Service: Urology;  Laterality: Right;    SOCIAL HISTORY:   Social History   Tobacco Use  . Smoking status: Never Smoker  . Smokeless tobacco: Never Used  Substance Use Topics  . Alcohol use: No    Alcohol/week: 0.0 standard drinks    FAMILY HISTORY:   Family History  Problem Relation Age of Onset  . Breast cancer Mother 66       deceased 53  . Breast cancer Maternal Aunt 68       deceased 71s  . Prostate cancer Father        deceased 41  .  Colon cancer Paternal Aunt 80       deceased 51s    DRUG ALLERGIES:   Allergies  Allergen Reactions  . Sulfa Antibiotics Other (See Comments)    Other reaction(s): Kidney Disorder    REVIEW OF SYSTEMS:  CONSTITUTIONAL: No fever, fatigue or weakness.  EYES: Wears glasses and had blurred vision EARS, NOSE, AND THROAT: No tinnitus or ear pain. No sore throat.  Decreased hearing RESPIRATORY: Some cough, and shortness of breath.  No wheezing or hemoptysis.  CARDIOVASCULAR: Some chest pain last week.  No orthopnea, edema.  GASTROINTESTINAL: No nausea, vomiting.  Positive for diarrhea, abdominal pain.  Positive for bright red blood per rectum  with darker blood clots GENITOURINARY: No dysuria, hematuria.  ENDOCRINE: No polyuria, nocturia,  HEMATOLOGY: No anemia, easy bruising or bleeding SKIN: No rash or lesion. MUSCULOSKELETAL: Positive for knee pain.   NEUROLOGIC: No tingling, numbness, weakness.  PSYCHIATRY: Positive for depression but no thoughts of suicidal ideation  MEDICATIONS AT HOME:   Prior to Admission medications   Medication Sig Start Date End Date Taking? Authorizing Provider  acetaminophen (TYLENOL) 500 MG tablet Take 500 mg by mouth every 6 (six) hours as needed (for pain.).    [provider]  alendronate (FOSAMAX) 70 MG tablet Take 70 mg by mouth once a week. 04/23/17   [provider]  anastrozole (ARIMIDEX) 1 MG tablet Take 1 tablet (1 mg total) by mouth daily. 02/06/17   [name], MD  aspirin EC 325 MG tablet Take 1 tablet (325 mg total) by mouth 2 (two) times daily. 04/07/17 04/07/18  Nicholes Mango, MD  furosemide (LASIX) 40 MG tablet Take 40 mg by mouth daily.    [provider]  labetalol (NORMODYNE) 200 MG tablet TAKE 1 TABLET (200MG  TOTAL) BY MOUTH 2 (TWO) TIMES DAILY. 08/09/14   [provider]  levothyroxine (SYNTHROID) 88 MCG tablet Take 88 mcg by mouth daily before breakfast.  06/20/14   [provider]  losartan-hydrochlorothiazide (HYZAAR) 100-25 MG tablet Take 1 tablet by mouth daily.    [provider]  octreotide (SANDOSTATIN LAR) 30 MG injection Inject 30 mg into the muscle every 28 (twenty-eight) days.     [provider]  potassium chloride SA (K-DUR,KLOR-CON) 20 MEQ tablet Take 2 tablets (40 mEq total) by mouth 2 (two) times daily. 09/19/16   [name], FNP  Vitamin D, Ergocalciferol, (DRISDOL) 50000 units CAPS capsule TAKE 1 CAPSULE (50,000 UNITS TOTAL) BY MOUTH ONCE A WEEK. 09/30/16   [name], MD      VITAL SIGNS:  Blood pressure (!) 141/64, pulse 61, temperature 98.5 F (36.9 C), temperature source  Oral, resp. rate 18, height 5\' 3"  (1.6 m), weight 95.3 kg, SpO2 96 %.  PHYSICAL EXAMINATION:  GENERAL:  77 y.o.-year-old patient lying in the bed with no acute distress.  EYES: Pupils equal, round, reactive to light and accommodation. No scleral icterus. Extraocular muscles intact.  HEENT: Head atraumatic, normocephalic. Oropharynx and nasopharynx clear.  NECK:  Supple, no jugular venous distention. No thyroid enlargement, no tenderness.  LUNGS: Normal breath sounds bilaterally, no wheezing, rales,rhonchi or crepitation. No use of accessory muscles of respiration.  CARDIOVASCULAR: S1, S2 normal. No murmurs, rubs, or gallops.  ABDOMEN: Soft, nontender, nondistended. Bowel sounds present. No organomegaly or mass.  EXTREMITIES: No pedal edema, cyanosis, or clubbing.  NEUROLOGIC: Cranial nerves II through XII are intact. Muscle strength 5/5 in all extremities. Sensation intact. Gait not checked.  PSYCHIATRIC: The patient  is alert and oriented x 3 Skin.  Erythema under bilateral breasts.  LABORATORY PANEL:   CBC Recent Labs  Lab 09/15/17 1338  WBC 10.9  HGB 12.5  HCT 36.4  PLT 171   ------------------------------------------------------------------------------------------------------------------  Chemistries  Recent Labs  Lab 09/15/17 1338  NA 134*  K 3.4*  CL 104  CO2 22  GLUCOSE 126*  BUN 20  CREATININE 0.81  CALCIUM 9.0  AST 27  ALT 14  ALKPHOS 68  BILITOT 1.0   ------------------------------------------------------------------------------------------------------------------    EKG:   Sinus bradycardia 58 bpm, Incomplete right bundle branch block, LVH  IMPRESSION AND PLAN:   1.  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  Serial hemoglobins.  Case discussed with Dr. Vicente Males gastroenterology and he will see the patient in the morning and decide on whether or not to do a colonoscopy.  We will give liquid diet tonight.  Advised to stop taking Advil.   Already stopped aspirin.  Advised not to take meloxicam and/or other anti-inflammatories in the future. 2.  Depression.  Patient would like to start low-dose Celexa after speaking with the daughter about this. 3.  Fungal infection under breast.  Nystatin powder. 4.  Hypertension on labetalol and losartan HCT. 5.  Hypokalemia on potassium supplementation.  Can consider discontinuing the HCTZ. 6.  History of breast cancer on anastrozole  All the records are reviewed and case discussed with ED provider. Management plans discussed with the patient, family and they are in agreement.  CODE STATUS: Full code  TOTAL TIME TAKING CARE OF THIS PATIENT: 50 minutes, including ACP time.    Loletha Grayer M.D on 09/15/2017 at 6:30 PM  Between 7am to 6pm - Pager - [phone]  After 6pm call admission pager [phone]  [facility]  [phone]  CC: Primary care physician; [name], MD

## 2017-09-15 NOTE — Progress Notes (Signed)
Patient ID: Emma [name], female   DOB: 1940-07-28, 77 y.o.   MRN: [id number]  ACP note  Patient daughter at bedside  Diagnosis: Lower GI bleed with history of carcinoid tumor in the small bowel.  Hypertension, depression, fungal infection under breast, history of breast cancer.  CODE STATUS discussed and patient wishes to be a full code.  Plan.  Observe overnight.  Serial hemoglobins.  GI consultation for further evaluation in the a.m.  Time spent on ACP discussion 17 minutes Dr. Loletha Grayer

## 2017-09-15 NOTE — ED Provider Notes (Signed)
[facility] Emergency Department Provider Note    First MD Initiated Contact with Patient 09/15/17 [phone]     (approximate)  I have reviewed the triage vital signs and the nursing notes.   HISTORY  Chief Complaint Rectal Bleeding and Abdominal Pain    HPI Emma [name] is a 77 y.o. 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  States she does feel lightheaded and is having some abdominal discomfort but none right now.  She denies any blood thinners but states that the bleeding has become significantly worse.  Noticing primarily blood instead of stool.  No melena.  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  Denies any chest pain or shortness of breath at this time.    Past Medical History:  Diagnosis Date  . Aromatase inhibitor use   . Breast cancer ([place]) 2014   left breast cancer/radiation  . Breast cancer, left breast ([place]) 06/19/2014  . CHF (congestive heart failure) (HCC)    recent sepsis  . Chronic kidney disease   . Dizziness   . Dyspnea    recently while admitted  . GERD (gastroesophageal reflux disease)   . Hearing loss   . History of hiatal hernia   . History of kidney stones   . HOH (hard of hearing)    severe  . Hypertension   . Hypothyroidism   . Leg DVT (deep venous thromboembolism), acute ([place]) 07/2015   left leg after fracture  . 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 Pain    chest pain while admitted  . Personal history of radiation therapy 2015   left breast ca  . Thyroid disease    Family History  Problem Relation Age of Onset  . Breast cancer Mother 33       deceased 24  . Breast cancer Maternal Aunt 68       deceased 73s  . Prostate cancer Father        deceased 27  . Colon cancer Paternal Aunt 10       deceased 37s   Past Surgical  History:  Procedure Laterality Date  . ABDOMINAL HYSTERECTOMY     partial  . BREAST LUMPECTOMY Left 01/13/2013   invasive mammary carcinoma, clear margins, LN negative  . BREAST LUMPECTOMY WITH SENTINEL LYMPH NODE BIOPSY Left 2014  . CHOLECYSTECTOMY    . CYSTOSCOPY W/ URETERAL STENT PLACEMENT Right 01/07/2016   Procedure: CYSTOSCOPY WITH STENT REPLACEMENT;  Surgeon: Hollice Espy, MD;  Location: [facility];  Service: Urology;  Laterality: Right;  . CYSTOSCOPY WITH RETROGRADE PYELOGRAM, URETEROSCOPY AND STENT PLACEMENT Right 12/19/2015   Procedure: CYSTOSCOPY WITH RETROGRADE PYELOGRAM, URETEROSCOPY AND STENT PLACEMENT;  Surgeon: Ardis Hughs, MD;  Location: [facility];  Service: Urology;  Laterality: Right;  . URETEROSCOPY WITH HOLMIUM LASER LITHOTRIPSY Right 01/07/2016   Procedure: URETEROSCOPY WITH HOLMIUM LASER LITHOTRIPSY;  Surgeon: Hollice Espy, MD;  Location: [facility];  Service: Urology;  Laterality: Right;   Patient Active Problem List   Diagnosis Date Noted  . Tibia/fibula fracture 04/05/2017  . Liver mass, right lobe 03/28/2016  . Chronic diastolic heart failure ([place]) 02/06/2016  . UTI (urinary tract infection) 01/25/2016  . Carcinoid tumor of small intestine, malignant ([place]) 10/08/2015  . Fall 08/25/2015  . Anxiety 08/25/2015  . GERD (gastroesophageal reflux disease)  08/25/2015  . HTN (hypertension) 08/25/2015  . Hypothyroidism 08/25/2015  . Iron deficiency anemia due to chronic blood loss 06/19/2014  . 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 Authorizing Provider  acetaminophen (TYLENOL) 500 MG tablet Take 500 mg by mouth every 6 (six) hours as needed (for pain.).    [provider]  alendronate (FOSAMAX) 70 MG tablet Take 70 mg by mouth once a week. 04/23/17   [provider]  anastrozole (ARIMIDEX) 1 MG tablet Take 1 tablet (1 mg total) by mouth daily. 02/06/17   [name], MD  aspirin EC 325 MG tablet Take 1 tablet (325 mg total) by mouth 2 (two) times daily. 04/07/17 04/07/18  Nicholes Mango, MD  furosemide (LASIX) 40 MG tablet Take 40 mg by mouth daily.    [provider]  labetalol (NORMODYNE) 200 MG tablet TAKE 1 TABLET (200MG  TOTAL) BY MOUTH 2 (TWO) TIMES DAILY. 08/09/14   [provider]  levothyroxine (SYNTHROID) 88 MCG tablet Take 88 mcg by mouth daily before breakfast.  06/20/14   [provider]  losartan-hydrochlorothiazide (HYZAAR) 100-25 MG tablet Take 1 tablet by mouth daily.    [provider]  octreotide (SANDOSTATIN LAR) 30 MG injection Inject 30 mg into the muscle every 28 (twenty-eight) days.     [provider]  potassium chloride SA (K-DUR,KLOR-CON) 20 MEQ tablet Take 2 tablets (40 mEq total) by mouth 2 (two) times daily. 09/19/16   [name], FNP  Vitamin D, Ergocalciferol, (DRISDOL) 50000 units CAPS capsule TAKE 1 CAPSULE (50,000 UNITS TOTAL) BY MOUTH ONCE A WEEK. 09/30/16   [name], MD    Allergies Sulfa antibiotics    Social History Social History   Tobacco Use  . Smoking status: Never Smoker  . Smokeless tobacco: Never Used  Substance Use Topics  . Alcohol use: No    Alcohol/week: 0.0 standard drinks  . Drug use: No    Review of Systems Patient denies headaches, rhinorrhea, blurry vision, numbness, shortness of breath, chest pain, edema, cough, abdominal pain, nausea, vomiting, diarrhea, dysuria, fevers, rashes or hallucinations unless otherwise stated above in HPI. ____________________________________________   PHYSICAL EXAM:  VITAL SIGNS: Vitals:   09/15/17 1630 09/15/17 1700  BP: 124/62 (!) 141/64  Pulse: (!) 54 61  Resp:    Temp:    SpO2: 95% 96%    Constitutional: Alert and oriented.  Eyes: Conjunctivae are normal.  Head: Atraumatic. Nose: No congestion/rhinnorhea. Mouth/Throat: Mucous membranes are moist.   Neck: No stridor. Painless ROM.   Cardiovascular: Normal rate, regular rhythm. Grossly normal heart sounds.  Good peripheral circulation. Respiratory: Normal respiratory effort.  No retractions. Lungs CTAB. Gastrointestinal: Soft and nontender. No distention. No abdominal bruits. No CVA tenderness. Genitourinary: Guaiac positive stools.  No palpable mass or hemorrhoid noted.  No anal fissure. Musculoskeletal: No lower extremity tenderness nor edema.  No joint effusions. Neurologic:  Normal speech and language. No gross focal neurologic deficits are appreciated. No facial droop Skin:  Skin is warm, dry and intact. No rash noted. Psychiatric: Mood and affect are normal. Speech and behavior are normal.  ____________________________________________   LABS (all labs ordered are listed, but only abnormal results are displayed)  Results for orders placed or performed during the hospital encounter of 09/15/17 (from the past 24 hour(s))  Comprehensive metabolic panel     Status: Abnormal   Collection Time: 09/15/17  1:38  PM  Result Value Ref Range   Sodium 134 (L) 135 - 145 mmol/L   Potassium 3.4 (L) 3.5 - 5.1 mmol/L   Chloride 104 98 - 111 mmol/L   CO2 22 22 - 32 mmol/L   Glucose, Bld 126 (H) 70 - 99 mg/dL   BUN 20 8 - 23 mg/dL   Creatinine, Ser 0.81 0.44 - 1.00 mg/dL   Calcium 9.0 8.9 - 10.3 mg/dL   Total Protein 6.4 (L) 6.5 - 8.1 g/dL   Albumin 3.6 3.5 - 5.0 g/dL   AST 27 15 - 41 U/L   ALT 14 0 - 44 U/L   Alkaline Phosphatase 68 38 - 126 U/L   Total Bilirubin 1.0 0.3 - 1.2 mg/dL   GFR calc non Af Amer >60 >60 mL/min   GFR calc Af Amer >60 >60 mL/min   Anion gap 8 5 - 15  CBC     Status: Abnormal   Collection Time: 09/15/17  1:38 PM  Result Value Ref Range   WBC 10.9 3.6 - 11.0 K/uL   RBC 4.20 3.80 - 5.20 MIL/uL   Hemoglobin 12.5 12.0 - 16.0 g/dL   HCT 36.4 35.0 - 47.0 %   MCV 86.8 80.0 - 100.0 fL   MCH 29.8 26.0 - 34.0 pg   MCHC 34.4 32.0 - 36.0 g/dL   RDW 15.0 (H) 11.5 - 14.5 %   Platelets 171 150 - 440  K/uL  Type and screen [place]     Status: None   Collection Time: 09/15/17  1:38 PM  Result Value Ref Range   ABO/RH(D) O POS    Antibody Screen NEG    Sample Expiration      09/18/2017 Performed at [facility], [address], [address], [place] [address]    ____________________________________________  EKG My review and personal interpretation at Time: 13:51   Indication: hematochezia  Rate: 60  Rhythm: sinuns Axis: normal Other: normal intervals, no stemi ____________________________________________  RADIOLOGY  I personally reviewed all radiographic images ordered to evaluate for the above acute complaints and reviewed radiology reports and findings.  These findings were personally discussed with the patient.  Please see medical record for radiology report.  ____________________________________________   PROCEDURES  Procedure(s) performed:  Procedures    Critical Care performed: no ____________________________________________   INITIAL IMPRESSION / ASSESSMENT AND PLAN / ED COURSE  Pertinent labs & imaging results that were available during my care of the patient were reviewed by me and considered in my medical decision making (see chart for details).   DDX: Mass, upper GI bleed, lower GI bleed, hemorrhoid, fissure  ARELI FRARY is a 77 y.o. who presents to the ED with symptoms as described above.  Patient is AFVSS in ED. Exam as above. Given current presentation have considered the above differential.  The patient will be placed on continuous pulse oximetry and telemetry for monitoring.  Laboratory evaluation will be sent to evaluate for the above complaints.  Blood work fortunately shows hemoglobin 12.5.  She is not hypotensive at this time.  Abdominal exam is soft benign but does have frequent on rectal exam guaiac positive stool.  Given her history of heart failure as well as mass complex past medical history do believe patient  would benefit from admission the hospital for serial hemoglobins and further medical management.       As part of my medical decision making, I reviewed the following data within the [facility]  Nursing notes reviewed and incorporated, Labs reviewed, notes from prior ED visits.   ____________________________________________   FINAL CLINICAL IMPRESSION(S) / ED DIAGNOSES  Final diagnoses:  Bright red blood per rectum      NEW MEDICATIONS STARTED DURING THIS VISIT:  New Prescriptions   No medications on file     Note:  This document was prepared using Dragon voice recognition software and may include unintentional dictation errors.    Merlyn Lot, MD 09/15/17 1740

## 2017-09-15 NOTE — ED Triage Notes (Addendum)
Pt arrives via POV from home with complaints of rectal bleeding x months. Pt states bleeding has been more consistent since Friday. Reports bright red blood "with occasional streaks of dark."  Pt also c/o left-sided lower abdominal pain below the level of the umbilicus for months that has been worse since bleeding became more regular (starting Friday).  Also c/o redness under both breasts, rash-like. Pt was using neosporin with some relief but it is now back and getting worse.  Hx breast cancer and "cancer around small intestines."

## 2017-09-16 DIAGNOSIS — C787 Secondary malignant neoplasm of liver and intrahepatic bile duct: Secondary | ICD-10-CM | POA: Diagnosis not present

## 2017-09-16 DIAGNOSIS — Z8042 Family history of malignant neoplasm of prostate: Secondary | ICD-10-CM

## 2017-09-16 DIAGNOSIS — C179 Malignant neoplasm of small intestine, unspecified: Secondary | ICD-10-CM | POA: Diagnosis not present

## 2017-09-16 DIAGNOSIS — Z800 Family history of malignant neoplasm of digestive organs: Secondary | ICD-10-CM

## 2017-09-16 DIAGNOSIS — D649 Anemia, unspecified: Secondary | ICD-10-CM

## 2017-09-16 DIAGNOSIS — K922 Gastrointestinal hemorrhage, unspecified: Secondary | ICD-10-CM | POA: Diagnosis not present

## 2017-09-16 DIAGNOSIS — Z803 Family history of malignant neoplasm of breast: Secondary | ICD-10-CM

## 2017-09-16 LAB — CBC
HCT: 34.4 % — ABNORMAL LOW (ref 35.0–47.0)
HEMOGLOBIN: 11.7 g/dL — AB (ref 12.0–16.0)
MCH: 29.7 pg (ref 26.0–34.0)
MCHC: 34.1 g/dL (ref 32.0–36.0)
MCV: 87.1 fL (ref 80.0–100.0)
Platelets: 162 10*3/uL (ref 150–440)
RBC: 3.95 MIL/uL (ref 3.80–5.20)
RDW: 15 % — ABNORMAL HIGH (ref 11.5–14.5)
WBC: 7.8 10*3/uL (ref 3.6–11.0)

## 2017-09-16 LAB — BASIC METABOLIC PANEL
ANION GAP: 6 (ref 5–15)
BUN: 18 mg/dL (ref 8–23)
CHLORIDE: 105 mmol/L (ref 98–111)
CO2: 26 mmol/L (ref 22–32)
Calcium: 8.8 mg/dL — ABNORMAL LOW (ref 8.9–10.3)
Creatinine, Ser: 0.71 mg/dL (ref 0.44–1.00)
GFR calc non Af Amer: >60 mL/min (ref >60)
Glucose, Bld: 131 mg/dL — ABNORMAL HIGH (ref 70–99)
POTASSIUM: 4.2 mmol/L (ref 3.5–5.1)
Sodium: 137 mmol/L (ref 135–145)

## 2017-09-16 MED ORDER — OCTREOTIDE ACETATE 500 MCG/ML IJ SOLN
500.0000 ug | Freq: Once | INTRAMUSCULAR | Status: AC
  Administered 2017-09-17: 09:00:00 500 ug via INTRAVENOUS
  Filled 2017-09-16: qty 1

## 2017-09-16 MED ORDER — PEG 3350-KCL-NA BICARB-NACL 420 G PO SOLR
4000.0000 mL | Freq: Once | ORAL | Status: AC
  Administered 2017-09-16: 19:00:00 4000 mL via ORAL
  Filled 2017-09-16: qty 4000

## 2017-09-16 MED ORDER — LOSARTAN POTASSIUM 50 MG PO TABS
100.0000 mg | ORAL_TABLET | Freq: Every day | ORAL | Status: DC
  Administered 2017-09-16: 100 mg via ORAL

## 2017-09-16 MED ORDER — HYDROCHLOROTHIAZIDE 25 MG PO TABS
25.0000 mg | ORAL_TABLET | Freq: Every day | ORAL | Status: DC
  Administered 2017-09-16: 25 mg via ORAL

## 2017-09-16 NOTE — Consult Note (Signed)
[place] NOTE  [name], [name] [name], [name] [name], [name] [name], [name]  CHIEF COMPLAINTS/PURPOSE OF CONSULTATION:  Carcinoid tumor/acute GI bleed.  HISTORY OF PRESENTING ILLNESS:  Emma [name] 77 y.o.  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  Patient is currently been in the hospital for rectal bleeding.  Patient states that she has been having intermittent rectal bleeding for the last few weeks.  Bright red blood filling the toilet bowl.  Denies abdominal pain.  Denies any nausea vomiting.  Patient states to have episodes of " 4-5 loose stools today"; also episodes of intermittent flushing.  No wheezing.   ROS: A complete 10 point review of system is done which is negative except mentioned above in history of present illness  MEDICAL HISTORY:  Past Medical History:  Diagnosis Date  . Aromatase inhibitor use   . Breast cancer ([place]) 2014   left breast cancer/radiation  . Breast cancer, left breast ([place]) 06/19/2014  . CHF (congestive heart failure) (HCC)    recent sepsis  . Chronic kidney disease   . Dizziness   . Dyspnea    recently while admitted  . GERD (gastroesophageal reflux disease)   . Hearing loss   . History of hiatal hernia   . History of kidney stones   . HOH (hard of hearing)    severe  . Hypertension   . Hypothyroidism   . Leg DVT (deep venous thromboembolism), acute ([place]) 07/2015   left leg after fracture  . 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 Pain    chest pain while admitted  . Personal history of radiation therapy 2015   left breast ca  . Thyroid disease     SURGICAL  HISTORY: Past Surgical History:  Procedure Laterality Date  . ABDOMINAL HYSTERECTOMY     partial  . BREAST LUMPECTOMY Left 01/13/2013   invasive mammary carcinoma, clear margins, LN negative  . BREAST LUMPECTOMY WITH SENTINEL LYMPH NODE BIOPSY Left 2014  . CHOLECYSTECTOMY    . CYSTOSCOPY W/ URETERAL STENT PLACEMENT Right 01/07/2016   Procedure: CYSTOSCOPY WITH STENT REPLACEMENT;  Surgeon: Hollice Espy, MD;  Location: [facility];  Service: Urology;  Laterality: Right;  . CYSTOSCOPY WITH RETROGRADE PYELOGRAM, URETEROSCOPY AND STENT PLACEMENT Right 12/19/2015   Procedure: CYSTOSCOPY WITH RETROGRADE PYELOGRAM, URETEROSCOPY AND STENT PLACEMENT;  Surgeon: Ardis Hughs, MD;  Location: [facility];  Service: Urology;  Laterality: Right;  . URETEROSCOPY WITH HOLMIUM LASER LITHOTRIPSY Right 01/07/2016   Procedure: URETEROSCOPY WITH HOLMIUM LASER LITHOTRIPSY;  Surgeon: Hollice Espy, MD;  Location: [facility];  Service: Urology;  Laterality: Right;    SOCIAL HISTORY: Social History   Socioeconomic History  . Marital status: Widowed    Spouse name: Not on file  . Number of children: Not on file  . Years of education: Not on file  . Highest education level: Not on file  Occupational History  . Not on file  Social Needs  . Financial resource strain: Not on file  . Food insecurity:    Worry: Not on file    Inability: Not on file  . Transportation needs:    Medical: Not on file    Non-medical: Not on file  Tobacco  Use  . Smoking status: Never Smoker  . Smokeless tobacco: Never Used  Substance and Sexual Activity  . Alcohol use: No    Alcohol/week: 0.0 standard drinks  . Drug use: No  . Sexual activity: Not on file  Lifestyle  . Physical activity:    Days per week: Not on file    Minutes per session: Not on file  . Stress: Not on file  Relationships  . Social connections:    Talks on phone: Not on file    Gets together: Not on file    Attends religious service: Not on file     Active member of club or organization: Not on file    Attends meetings of clubs or organizations: Not on file    Relationship status: Not on file  . Intimate partner violence:    Fear of current or ex partner: Not on file    Emotionally abused: Not on file    Physically abused: Not on file    Forced sexual activity: Not on file  Other Topics Concern  . Not on file  Social History Narrative  . Not on file    FAMILY HISTORY: Family History  Problem Relation Age of Onset  . Breast cancer Mother 63       deceased 40  . Breast cancer Maternal Aunt 68       deceased 31s  . Prostate cancer Father        deceased 41  . Colon cancer Paternal Aunt 37       deceased 33s    ALLERGIES:  is allergic to sulfa antibiotics.  MEDICATIONS:  Current Facility-Administered Medications  Medication Dose Route Frequency Provider Last Rate Last Dose  . acetaminophen (TYLENOL) tablet 650 mg  650 mg Oral Q6H PRN Loletha Grayer, MD       Or  . acetaminophen (TYLENOL) suppository 650 mg  650 mg Rectal Q6H PRN Wieting, Richard, MD      . anastrozole (ARIMIDEX) tablet 1 mg  1 mg Oral Daily Loletha Grayer, MD   1 mg at 09/16/17 1242  . citalopram (CELEXA) tablet 10 mg  10 mg Oral Daily Wieting, Richard, MD      . losartan (COZAAR) tablet 100 mg  100 mg Oral q1800 Wieting, Richard, MD       And  . hydrochlorothiazide (HYDRODIURIL) tablet 25 mg  25 mg Oral q1800 Wieting, Richard, MD      . labetalol (NORMODYNE) tablet 200 mg  200 mg Oral BID Loletha Grayer, MD   200 mg at 09/16/17 1240  . levothyroxine (SYNTHROID, LEVOTHROID) tablet 88 mcg  88 mcg Oral QAC breakfast Wieting, Richard, MD      . nystatin (MYCOSTATIN/NYSTOP) topical powder   Topical TID Loletha Grayer, MD      . ondansetron [facility]) tablet 4 mg  4 mg Oral Q6H PRN Loletha Grayer, MD       Or  . ondansetron (ZOFRAN) injection 4 mg  4 mg Intravenous Q6H PRN Wieting, Richard, MD      . oxyCODONE (Oxy IR/ROXICODONE) immediate release  tablet 5 mg  5 mg Oral Q6H PRN Wieting, Richard, MD      . polyethylene glycol-electrolytes (NuLYTELY/GoLYTELY) solution 4,000 mL  4,000 mL Oral Once Jonathon Bellows, MD      . potassium chloride SA (K-DUR,KLOR-CON) CR tablet 40 mEq  40 mEq Oral BID Loletha Grayer, MD   40 mEq at 09/16/17 1238      .  PHYSICAL EXAMINATION:  Vitals:   09/16/17 1235 09/16/17 1517  BP: (!) 144/56 133/61  Pulse: 62 (!) 56  Resp: 18 18  Temp: 97.6 F (36.4 C) 98.6 F (37 C)  SpO2: 98% 95%   Filed Weights   09/15/17 1328 09/15/17 1851  Weight: 210 lb (95.3 kg) 205 lb 9.6 oz (93.3 kg)    GENERAL: Well-nourished well-developed; Alert, no distress and comfortable.  Alone. EYES: no pallor or icterus OROPHARYNX: no thrush or ulceration. NECK: supple, no masses felt LYMPH:  no palpable lymphadenopathy in the cervical, axillary or inguinal regions LUNGS: decreased breath sounds to auscultation at bases and  No wheeze or crackles HEART/CVS: regular rate & rhythm and no murmurs; No lower extremity edema ABDOMEN: abdomen soft, non-tender and normal bowel sounds Musculoskeletal:no cyanosis of digits and no clubbing  PSYCH: alert & oriented x 3 with fluent speech NEURO: no focal motor/sensory deficits SKIN:  no rashes or significant lesions  LABORATORY DATA:  I have reviewed the data as listed Lab Results  Component Value Date   WBC 7.8 09/16/2017   HGB 11.7 (L) 09/16/2017   HCT 34.4 (L) 09/16/2017   MCV 87.1 09/16/2017   PLT 162 09/16/2017   Recent Labs    06/29/17 1054 08/24/17 1000 09/15/17 1338 09/16/17 0422  NA 137 136 134* 137  K 3.2* 3.5 3.4* 4.2  CL 104 101 104 105  CO2 22 24 22 26   GLUCOSE 120* 123* 126* 131*  BUN 16 19 20 18   CREATININE 0.77 0.81 0.81 0.71  CALCIUM 9.3 9.4 9.0 8.8*  GFRNONAA >60 >60 >60 >60  GFRAA >60 >60 >60 >60  PROT 7.0 7.3 6.4*  --   ALBUMIN 4.1 3.9 3.6  --   AST 28 22 27   --   ALT 14 14 14   --   ALKPHOS 81 81 68  --   BILITOT 1.0 1.0 1.0  --      RADIOGRAPHIC STUDIES: I have personally reviewed the radiological images as listed and agreed with the findings in the report. No results found.  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 No obvious biochemical evidence of functional carcinoid.  However given the upcoming colonoscopy-I would recommend a bolus of IV octreotide prior to the colonoscopy.  #Rectal bleeding-intermittent as per patient etiology is unclear await colonoscopy as planned for tomorrow.   #Anemia-Baseline hemoglobin 12-13 currently 11.7.  Monitor closely follow-up.  Thank you Dr.Vachhani for allowing me to participate in the care of your pleasant patient. Please do not hesitate to contact me with questions or concerns in the interim.   All questions were answered. The patient knows to call the clinic with any problems, questions or concerns.    [name], MD 09/16/2017 5:16 PM

## 2017-09-16 NOTE — Consult Note (Signed)
Jonathon Bellows , MD [address], [place], [place], [place], [address] [address], [place], [place], [place], [address] Phone: [phone]  Fax: [phone]  Consultation  Referring Provider:   Dr Earleen Newport  Primary Care Physician:  [name], MD Primary Gastroenterologist:  None          Reason for Consultation:     Gi bleed   Date of Admission:  09/15/2017 Date of Consultation:  09/16/2017         HPI:   Emma [name] is a 77 y.o. female with known carcinoid and metastasis in the liver known since 2016 , mesenteric mass , iron deficiency anemia, colonoscopy in 2016 byu Dr Tiffany Kocher showed AVM.She is on Sandostatin.   For the last few weeks she has had rectal bleeding . Hb 12.5 grams on admission.  She is very hard of hearing and says that she suffers from constipation and has had on and off rectal bleeding described as bright red blood and some dark streaks in the toilet bowl. Denies any pain, no use of any blood thinners.  No other complaints  Past Medical History:  Diagnosis Date  . Aromatase inhibitor use   . Breast cancer ([place]) 2014   left breast cancer/radiation  . Breast cancer, left breast ([place]) 06/19/2014  . CHF (congestive heart failure) (HCC)    recent sepsis  . Chronic kidney disease   . Dizziness   . Dyspnea    recently while admitted  . GERD (gastroesophageal reflux disease)   . Hearing loss   . History of hiatal hernia   . History of kidney stones   . HOH (hard of hearing)    severe  . Hypertension   . Hypothyroidism   . Leg DVT (deep venous thromboembolism), acute ([place]) 07/2015   left leg after fracture  . 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 Pain    chest pain while admitted  . Personal history of radiation therapy 2015   left breast ca  . Thyroid disease     Past Surgical History:  Procedure Laterality Date  . ABDOMINAL HYSTERECTOMY     partial  . BREAST LUMPECTOMY Left 01/13/2013   invasive mammary carcinoma, clear margins, LN negative  .  BREAST LUMPECTOMY WITH SENTINEL LYMPH NODE BIOPSY Left 2014  . CHOLECYSTECTOMY    . CYSTOSCOPY W/ URETERAL STENT PLACEMENT Right 01/07/2016   Procedure: CYSTOSCOPY WITH STENT REPLACEMENT;  Surgeon: Hollice Espy, MD;  Location: [facility];  Service: Urology;  Laterality: Right;  . CYSTOSCOPY WITH RETROGRADE PYELOGRAM, URETEROSCOPY AND STENT PLACEMENT Right 12/19/2015   Procedure: CYSTOSCOPY WITH RETROGRADE PYELOGRAM, URETEROSCOPY AND STENT PLACEMENT;  Surgeon: Ardis Hughs, MD;  Location: [facility];  Service: Urology;  Laterality: Right;  . URETEROSCOPY WITH HOLMIUM LASER LITHOTRIPSY Right 01/07/2016   Procedure: URETEROSCOPY WITH HOLMIUM LASER LITHOTRIPSY;  Surgeon: Hollice Espy, MD;  Location: [facility];  Service: Urology;  Laterality: Right;    Prior to Admission medications   Medication Sig Start Date End Date Taking? Authorizing Provider  acetaminophen (TYLENOL) 500 MG tablet Take 500 mg by mouth every 6 (six) hours as needed (for pain.).    [provider]  alendronate (FOSAMAX) 70 MG tablet Take 70 mg by mouth once a week. 04/23/17   [provider]  anastrozole (ARIMIDEX) 1 MG tablet Take 1 tablet (1 mg total) by mouth daily. 02/06/17   [name], MD  aspirin EC 325 MG tablet Take 1 tablet (325 mg total) by mouth  2 (two) times daily. 04/07/17 04/07/18  Nicholes Mango, MD  furosemide (LASIX) 40 MG tablet Take 40 mg by mouth daily.    [provider]  labetalol (NORMODYNE) 200 MG tablet TAKE 1 TABLET (200MG  TOTAL) BY MOUTH 2 (TWO) TIMES DAILY. 08/09/14   [provider]  levothyroxine (SYNTHROID) 88 MCG tablet Take 88 mcg by mouth daily before breakfast.  06/20/14   [provider]  losartan-hydrochlorothiazide (HYZAAR) 100-25 MG tablet Take 1 tablet by mouth daily.    [provider]  octreotide (SANDOSTATIN LAR) 30 MG injection Inject 30 mg into the muscle every 28 (twenty-eight) days.     [provider]  potassium  chloride SA (K-DUR,KLOR-CON) 20 MEQ tablet Take 2 tablets (40 mEq total) by mouth 2 (two) times daily. 09/19/16   [name], FNP  Vitamin D, Ergocalciferol, (DRISDOL) 50000 units CAPS capsule TAKE 1 CAPSULE (50,000 UNITS TOTAL) BY MOUTH ONCE A WEEK. 09/30/16   [name], MD    Family History  Problem Relation Age of Onset  . Breast cancer Mother 108       deceased 40  . Breast cancer Maternal Aunt 68       deceased 36s  . Prostate cancer Father        deceased 70  . Colon cancer Paternal Aunt 80       deceased 79s     Social History   Tobacco Use  . Smoking status: Never Smoker  . Smokeless tobacco: Never Used  Substance Use Topics  . Alcohol use: No    Alcohol/week: 0.0 standard drinks  . Drug use: No    Allergies as of 09/15/2017 - Review Complete 09/15/2017  Allergen Reaction Noted  . Sulfa antibiotics Other (See Comments) 05/12/2013    Review of Systems:    All systems reviewed and negative except where noted in HPI.   Physical Exam:  Vital signs in last 24 hours: Temp:  [97.7 F (36.5 C)-98.6 F (37 C)] 97.7 F (36.5 C) (08/21 0505) Pulse Rate:  [48-61] 57 (08/21 0505) Resp:  [18] 18 (08/21 0505) BP: (124-156)/(54-112) 132/54 (08/21 0505) SpO2:  [91 %-97 %] 96 % (08/21 0505) Weight:  [93.3 kg-95.3 kg] 93.3 kg (08/20 1851)   General:   Pleasant, cooperative in NAD Head:  Normocephalic and atraumatic. Eyes:   No icterus.   Conjunctiva pink. PERRLA. Ears: very hard of hearing  Neck:  Supple; no masses or thyroidomegaly Lungs: Respirations even and unlabored. Lungs clear to auscultation bilaterally.   No wheezes, crackles, or rhonchi.  Heart:  Regular rate and rhythm;  Without murmur, clicks, rubs or gallops Abdomen:  Soft, nondistended, nontender. Normal bowel sounds. No appreciable masses or hepatomegaly.  No rebound or guarding.  Neurologic:  Alert and oriented x3;  grossly normal neurologically. Skin:  Intact without significant lesions or  rashes. Cervical Nodes:  No significant cervical adenopathy. Psych:  Alert and cooperative. Normal affect.  LAB RESULTS: Recent Labs    09/15/17 1338 09/16/17 0422  WBC 10.9 7.8  HGB 12.5 11.7*  HCT 36.4 34.4*  PLT 171 162   BMET Recent Labs    09/15/17 1338 09/16/17 0422  NA 134* 137  K 3.4* 4.2  CL 104 105  CO2 22 26  GLUCOSE 126* 131*  BUN 20 18  CREATININE 0.81 0.71  CALCIUM 9.0 8.8*   LFT Recent Labs    09/15/17 1338  PROT 6.4*  ALBUMIN 3.6  AST 27  ALT 14  ALKPHOS 68  BILITOT 1.0   PT/INR Recent Labs    09/15/17 1753  LABPROT 12.4  INR 0.93   CBC Latest Ref Rng & Units 09/16/2017 09/15/2017 08/24/2017  WBC 3.6 - 11.0 K/uL 7.8 10.9 5.8  Hemoglobin 12.0 - 16.0 g/dL 11.7(L) 12.5 12.9  Hematocrit 35.0 - 47.0 % 34.4(L) 36.4 38.2  Platelets 150 - 440 K/uL 162 171 203     STUDIES: No results found.    Impression / Plan:   Emma [name] is a 77 y.o. y/o female with a history of metastatic carcinoid of the small bowel . Admitted with rectal bleeding .  Plan  1. Colonoscopy tomorrow   I have discussed alternative options, risks & benefits,  which include, but are not limited to, bleeding, infection, perforation,respiratory complication & drug reaction.  The patient agrees with this plan & written consent will be obtained.     Thank you for involving me in the care of this patient.      LOS: 0 days   Jonathon Bellows, MD  09/16/2017, 9:10 AM

## 2017-09-16 NOTE — Progress Notes (Signed)
[facility] [place] NAME: Emma [name]    MR#:  [id number]  DATE OF BIRTH:  Aug 16, 1940  SUBJECTIVE:  CHIEF COMPLAINT:   Chief Complaint  Patient presents with  . Rectal Bleeding  . Abdominal Pain   came with blood in stool. Hb stable. No BM or bleed yet. No abd pain now.   REVIEW OF SYSTEMS:  CONSTITUTIONAL: No fever, fatigue or weakness.  EYES: No blurred or double vision.  EARS, NOSE, AND THROAT: No tinnitus or ear pain.  RESPIRATORY: No cough, shortness of breath, wheezing or hemoptysis.  CARDIOVASCULAR: No chest pain, orthopnea, edema.  GASTROINTESTINAL: No nausea, vomiting, diarrhea or abdominal pain.  GENITOURINARY: No dysuria, hematuria.  ENDOCRINE: No polyuria, nocturia,  HEMATOLOGY: No anemia, easy bruising or bleeding SKIN: No rash or lesion. MUSCULOSKELETAL: No joint pain or arthritis.   NEUROLOGIC: No tingling, numbness, weakness.  PSYCHIATRY: No anxiety or depression.   ROS  DRUG ALLERGIES:   Allergies  Allergen Reactions  . Sulfa Antibiotics Other (See Comments)    Other reaction(s): Kidney Disorder    VITALS:  Blood pressure 133/61, pulse (!) 56, temperature 98.6 F (37 C), temperature source Oral, resp. rate 18, height 5\' 2"  (1.575 m), weight 93.3 kg, SpO2 95 %.  PHYSICAL EXAMINATION:  GENERAL:  77 y.o.-year-old patient lying in the bed with no acute distress.  EYES: Pupils equal, round, reactive to light and accommodation. No scleral icterus. Extraocular muscles intact.  HEENT: Head atraumatic, normocephalic. Oropharynx and nasopharynx clear.  NECK:  Supple, no jugular venous distention. No thyroid enlargement, no tenderness.  LUNGS: Normal breath sounds bilaterally, no wheezing, rales,rhonchi or crepitation. No use of accessory muscles of respiration.  CARDIOVASCULAR: S1, S2 normal. No murmurs, rubs, or gallops.  ABDOMEN: Soft, nontender, nondistended. Bowel sounds present. No organomegaly or mass.   EXTREMITIES: No pedal edema, cyanosis, or clubbing.  NEUROLOGIC: Cranial nerves II through XII are intact. Muscle strength 5/5 in all extremities. Sensation intact. Gait not checked.  PSYCHIATRIC: The patient is alert and oriented x 3.  SKIN: No obvious rash, lesion, or ulcer.   Physical Exam LABORATORY PANEL:   CBC Recent Labs  Lab 09/16/17 0422  WBC 7.8  HGB 11.7*  HCT 34.4*  PLT 162   ------------------------------------------------------------------------------------------------------------------  Chemistries  Recent Labs  Lab 09/15/17 1338 09/16/17 0422  NA 134* 137  K 3.4* 4.2  CL 104 105  CO2 22 26  GLUCOSE 126* 131*  BUN 20 18  CREATININE 0.81 0.71  CALCIUM 9.0 8.8*  AST 27  --   ALT 14  --   ALKPHOS 68  --   BILITOT 1.0  --    ------------------------------------------------------------------------------------------------------------------  Cardiac Enzymes No results for input(s): TROPONINI in the last 168 hours. ------------------------------------------------------------------------------------------------------------------  RADIOLOGY:  No results found.  ASSESSMENT AND PLAN:   Active Problems:   Lower GI bleed  1.  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  Serial hemoglobins.  Case discussed with Dr. Vicente Males gastroenterology  give liquid diet tonight.  Advised to stop taking Advil.  Already stopped aspirin.  Advised not to take meloxicam and/or other anti-inflammatories in the future. Colonoscopy tomorrow. 2.  Depression.  Patient would like to start low-dose Celexa after speaking with the daughter about this. 3.  Fungal infection under breast.  Nystatin powder. 4.  Hypertension on labetalol and losartan HCT. 5.  Hypokalemia on potassium supplementation.  Can consider discontinuing the HCTZ. 6.  History of  breast cancer on anastrozole   All the records are reviewed and case discussed with Care Management/Social  Workerr. Management plans discussed with the patient, family and they are in agreement.  CODE STATUS: Full.  TOTAL TIME TAKING CARE OF THIS PATIENT: 35 minutes.    POSSIBLE D/C IN 1-2 DAYS, DEPENDING ON CLINICAL CONDITION.   Vaughan Basta M.D on 09/16/2017   Between 7am to 6pm - Pager - [phone]  After 6pm go to [email or web address] - password [email or web address] [facility]  Office  [phone]  CC: Primary care physician; [name], MD  Note: This dictation was prepared with Dragon dictation along with smaller phrase technology. Any transcriptional errors that result from this process are unintentional.

## 2017-09-17 ENCOUNTER — Observation Stay: Payer: Medicare Other | Admitting: Anesthesiology

## 2017-09-17 ENCOUNTER — Encounter
Admission: EM | Disposition: A | Discharge status: Home / Self Care | Payer: Self-pay | Source: Home / Self Care | Attending: Internal Medicine

## 2017-09-17 DIAGNOSIS — K640 First degree hemorrhoids: Secondary | ICD-10-CM | POA: Diagnosis present

## 2017-09-17 DIAGNOSIS — E340 Carcinoid syndrome: Secondary | ICD-10-CM | POA: Diagnosis present

## 2017-09-17 DIAGNOSIS — K922 Gastrointestinal hemorrhage, unspecified: Secondary | ICD-10-CM | POA: Diagnosis not present

## 2017-09-17 DIAGNOSIS — F329 Major depressive disorder, single episode, unspecified: Secondary | ICD-10-CM | POA: Diagnosis present

## 2017-09-17 DIAGNOSIS — H9190 Unspecified hearing loss, unspecified ear: Secondary | ICD-10-CM | POA: Diagnosis present

## 2017-09-17 DIAGNOSIS — I110 Hypertensive heart disease with heart failure: Secondary | ICD-10-CM | POA: Diagnosis present

## 2017-09-17 DIAGNOSIS — D509 Iron deficiency anemia, unspecified: Secondary | ICD-10-CM | POA: Diagnosis present

## 2017-09-17 DIAGNOSIS — I739 Peripheral vascular disease, unspecified: Secondary | ICD-10-CM | POA: Diagnosis present

## 2017-09-17 DIAGNOSIS — Z803 Family history of malignant neoplasm of breast: Secondary | ICD-10-CM | POA: Diagnosis not present

## 2017-09-17 DIAGNOSIS — K625 Hemorrhage of anus and rectum: Secondary | ICD-10-CM | POA: Diagnosis present

## 2017-09-17 DIAGNOSIS — Z853 Personal history of malignant neoplasm of breast: Secondary | ICD-10-CM | POA: Diagnosis not present

## 2017-09-17 DIAGNOSIS — C179 Malignant neoplasm of small intestine, unspecified: Secondary | ICD-10-CM | POA: Diagnosis not present

## 2017-09-17 DIAGNOSIS — D649 Anemia, unspecified: Secondary | ICD-10-CM | POA: Diagnosis not present

## 2017-09-17 DIAGNOSIS — E039 Hypothyroidism, unspecified: Secondary | ICD-10-CM | POA: Diagnosis present

## 2017-09-17 DIAGNOSIS — Z86718 Personal history of other venous thrombosis and embolism: Secondary | ICD-10-CM | POA: Diagnosis not present

## 2017-09-17 DIAGNOSIS — K219 Gastro-esophageal reflux disease without esophagitis: Secondary | ICD-10-CM | POA: Diagnosis present

## 2017-09-17 DIAGNOSIS — I5030 Unspecified diastolic (congestive) heart failure: Secondary | ICD-10-CM | POA: Diagnosis present

## 2017-09-17 DIAGNOSIS — Z79811 Long term (current) use of aromatase inhibitors: Secondary | ICD-10-CM | POA: Diagnosis not present

## 2017-09-17 DIAGNOSIS — K921 Melena: Secondary | ICD-10-CM | POA: Diagnosis present

## 2017-09-17 DIAGNOSIS — D120 Benign neoplasm of cecum: Secondary | ICD-10-CM

## 2017-09-17 DIAGNOSIS — Z7982 Long term (current) use of aspirin: Secondary | ICD-10-CM | POA: Diagnosis not present

## 2017-09-17 DIAGNOSIS — C787 Secondary malignant neoplasm of liver and intrahepatic bile duct: Secondary | ICD-10-CM | POA: Diagnosis not present

## 2017-09-17 DIAGNOSIS — Z79890 Hormone replacement therapy (postmenopausal): Secondary | ICD-10-CM | POA: Diagnosis not present

## 2017-09-17 DIAGNOSIS — E876 Hypokalemia: Secondary | ICD-10-CM | POA: Diagnosis present

## 2017-09-17 DIAGNOSIS — Z79899 Other long term (current) drug therapy: Secondary | ICD-10-CM | POA: Diagnosis not present

## 2017-09-17 DIAGNOSIS — Z800 Family history of malignant neoplasm of digestive organs: Secondary | ICD-10-CM | POA: Diagnosis not present

## 2017-09-17 DIAGNOSIS — Z923 Personal history of irradiation: Secondary | ICD-10-CM | POA: Diagnosis not present

## 2017-09-17 DIAGNOSIS — Z90710 Acquired absence of both cervix and uterus: Secondary | ICD-10-CM | POA: Diagnosis not present

## 2017-09-17 HISTORY — PX: COLONOSCOPY WITH PROPOFOL: SHX5780

## 2017-09-17 SURGERY — COLONOSCOPY WITH PROPOFOL
Anesthesia: General

## 2017-09-17 MED ORDER — EPHEDRINE SULFATE 50 MG/ML IJ SOLN
INTRAMUSCULAR | Status: DC | PRN
  Administered 2017-09-17: 5 mg via INTRAVENOUS

## 2017-09-17 MED ORDER — OXYCODONE HCL 5 MG PO TABS: 5.0000 mg | ORAL_TABLET | Freq: Four times a day (QID) | ORAL | 0 refills | Status: DC | PRN

## 2017-09-17 MED ORDER — PROPOFOL 500 MG/50ML IV EMUL
INTRAVENOUS | Status: AC
  Filled 2017-09-17: qty 50

## 2017-09-17 MED ORDER — PROPOFOL 10 MG/ML IV BOLUS
INTRAVENOUS | Status: AC
  Filled 2017-09-17: qty 20

## 2017-09-17 MED ORDER — PROPOFOL 10 MG/ML IV BOLUS
INTRAVENOUS | Status: DC | PRN
  Administered 2017-09-17: 30 mg via INTRAVENOUS
  Administered 2017-09-17: 20 mg via INTRAVENOUS

## 2017-09-17 MED ORDER — LIDOCAINE HCL (PF) 2 % IJ SOLN
INTRAMUSCULAR | Status: DC | PRN
  Administered 2017-09-17: 80 mg via INTRADERMAL

## 2017-09-17 MED ORDER — PROPOFOL 500 MG/50ML IV EMUL
INTRAVENOUS | Status: DC | PRN
  Administered 2017-09-17: 75 ug/kg/min via INTRAVENOUS

## 2017-09-17 MED ORDER — SODIUM CHLORIDE 0.9 % IV SOLN
INTRAVENOUS | Status: DC
  Administered 2017-09-17: 1000 mL via INTRAVENOUS

## 2017-09-17 NOTE — Anesthesia Preprocedure Evaluation (Addendum)
Anesthesia Evaluation  Patient identified by MRN, date of birth, ID band Patient awake    Reviewed: Allergy & Precautions, H&P , NPO status , Patient's Chart, lab work & pertinent test results  Airway Mallampati: III  TM Distance: >3 FB Neck ROM: full    Dental  (+) Teeth Intact Bridge upper:   Pulmonary shortness of breath and with exertion,    breath sounds clear to auscultation       Cardiovascular hypertension, + Peripheral Vascular Disease and +CHF (diastolic CHF, unclear severity.  normal systolic function, EF 34-19% on echo 2017)   Rhythm:regular Rate:Normal     Neuro/Psych PSYCHIATRIC DISORDERS Anxiety negative neurological ROS  negative psych ROS   GI/Hepatic Neg liver ROS, hiatal hernia, GERD  ,Mesenteric mass   Endo/Other  Hypothyroidism   Renal/GU CRFRenal disease  negative genitourinary   Musculoskeletal   Abdominal   Peds  Hematology negative hematology ROS (+) anemia ,   Anesthesia Other Findings Past Medical History: No date: Aromatase inhibitor use 2014: Breast cancer ([place])     Comment:  left breast cancer/radiation 06/19/2014: Breast cancer, left breast (HCC) No date: CHF (congestive heart failure) (HCC)     Comment:  recent sepsis No date: Chronic kidney disease No date: Dizziness No date: Dyspnea     Comment:  recently while admitted No date: GERD (gastroesophageal reflux disease) No date: Hearing loss No date: History of hiatal hernia No date: History of kidney stones No date: HOH (hard of hearing)     Comment:  severe No date: Hypertension No date: Hypothyroidism 07/2015: Leg DVT (deep venous thromboembolism), acute ([place])     Comment:  left leg after fracture 06/19/2014: Mesenteric mass No date: Pain     Comment:  chest pain while admitted 2015: Personal history of radiation therapy     Comment:  left breast ca No date: Thyroid disease  Past Surgical History: No date: ABDOMINAL  HYSTERECTOMY     Comment:  partial 01/13/2013: BREAST LUMPECTOMY; Left     Comment:  invasive mammary carcinoma, clear margins, LN negative 2014: BREAST LUMPECTOMY WITH SENTINEL LYMPH NODE BIOPSY; Left No date: CHOLECYSTECTOMY 01/07/2016: CYSTOSCOPY W/ URETERAL STENT PLACEMENT; Right     Comment:  Procedure: CYSTOSCOPY WITH STENT REPLACEMENT;  Surgeon:               Hollice Espy, MD;  Location: [facility];  Service:               Urology;  Laterality: Right; 12/19/2015: CYSTOSCOPY WITH RETROGRADE PYELOGRAM, URETEROSCOPY AND  STENT PLACEMENT; Right     Comment:  Procedure: CYSTOSCOPY WITH RETROGRADE PYELOGRAM,               URETEROSCOPY AND STENT PLACEMENT;  Surgeon: Ardis Hughs, MD;  Location: [facility];  Service: Urology;                Laterality: Right; 01/07/2016: URETEROSCOPY WITH HOLMIUM LASER LITHOTRIPSY; Right     Comment:  Procedure: URETEROSCOPY WITH HOLMIUM LASER LITHOTRIPSY;               Surgeon: Hollice Espy, MD;  Location: [facility];                Service: Urology;  Laterality: Right;  BMI    Body Mass Index:  37.60 kg/m      Reproductive/Obstetrics negative OB ROS  Anesthesia Physical Anesthesia Plan  ASA: III  Anesthesia Plan: General   Post-op Pain Management:    Induction:   PONV Risk Score and Plan: Propofol infusion  Airway Management Planned: Natural Airway and Nasal Cannula  Additional Equipment:   Intra-op Plan:   Post-operative Plan:   Informed Consent: I have reviewed the patients History and Physical, chart, labs and discussed the procedure including the risks, benefits and alternatives for the proposed anesthesia with the patient or authorized representative who has indicated his/her understanding and acceptance.   Dental Advisory Given  Plan Discussed with: Anesthesiologist, CRNA and Surgeon  Anesthesia Plan Comments:         Anesthesia Quick  Evaluation

## 2017-09-17 NOTE — H&P (Signed)
Jonathon Bellows, MD [address] [place], [place], [place], [address] [address], [place], [place], [place], [address] Phone: [phone]  Fax: [phone]  Primary Care Physician:  [name], MD   Pre-Procedure History & Physical: HPI:  Emma [name] is a 77 y.o. female is here for an colonoscopy.   Past Medical History:  Diagnosis Date  . Aromatase inhibitor use   . Breast cancer ([place]) 2014   left breast cancer/radiation  . Breast cancer, left breast ([place]) 06/19/2014  . CHF (congestive heart failure) (HCC)    recent sepsis  . Chronic kidney disease   . Dizziness   . Dyspnea    recently while admitted  . GERD (gastroesophageal reflux disease)   . Hearing loss   . History of hiatal hernia   . History of kidney stones   . HOH (hard of hearing)    severe  . Hypertension   . Hypothyroidism   . Leg DVT (deep venous thromboembolism), acute ([place]) 07/2015   left leg after fracture  . 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 Pain    chest pain while admitted  . Personal history of radiation therapy 2015   left breast ca  . Thyroid disease     Past Surgical History:  Procedure Laterality Date  . ABDOMINAL HYSTERECTOMY     partial  . BREAST LUMPECTOMY Left 01/13/2013   invasive mammary carcinoma, clear margins, LN negative  . BREAST LUMPECTOMY WITH SENTINEL LYMPH NODE BIOPSY Left 2014  . CHOLECYSTECTOMY    . CYSTOSCOPY W/ URETERAL STENT PLACEMENT Right 01/07/2016   Procedure: CYSTOSCOPY WITH STENT REPLACEMENT;  Surgeon: Hollice Espy, MD;  Location: [facility] ORS;  Service: Urology;  Laterality: Right;  . CYSTOSCOPY WITH RETROGRADE PYELOGRAM, URETEROSCOPY AND STENT PLACEMENT Right 12/19/2015   Procedure: CYSTOSCOPY WITH RETROGRADE PYELOGRAM, URETEROSCOPY AND STENT PLACEMENT;  Surgeon: Ardis Hughs, MD;  Location: [facility] ORS;  Service: Urology;  Laterality: Right;  . URETEROSCOPY WITH HOLMIUM LASER LITHOTRIPSY Right 01/07/2016   Procedure: URETEROSCOPY WITH HOLMIUM  LASER LITHOTRIPSY;  Surgeon: Hollice Espy, MD;  Location: [facility] OR[facility];  Service: Urology;  Laterality: Right;    Prior to Admission medications   Medication Sig Start Date End Date Taking? Authorizing Provider  alendronate (FOSAMAX) 70 MG tablet Take 70 mg by mouth once a week. 04/23/17  Yes [provider]  anastrozole (ARIMIDEX) 1 MG tablet Take 1 tablet (1 mg total) by mouth daily. 02/06/17  Yes [name], MD  furosemide (LASIX) 40 MG tablet Take 40 mg by mouth daily as needed for fluid or edema.    Yes [provider]  labetalol (NORMODYNE) 200 MG tablet Take 200 mg by mouth 2 (two) times daily.    Yes [provider]  levothyroxine (SYNTHROID) 88 MCG tablet Take 88 mcg by mouth daily before breakfast.  06/20/14  Yes [provider]  losartan-hydrochlorothiazide (HYZAAR) 100-25 MG tablet Take 1 tablet by mouth daily.   Yes [provider]  meloxicam (MOBIC) 15 MG tablet Take 15 mg by mouth daily.   Yes [provider]  octreotide (SANDOSTATIN LAR) 30 MG injection Inject 30 mg into the muscle every 28 (twenty-eight) days.    Yes [provider]  potassium chloride SA (K-DUR,KLOR-CON) 20 MEQ tablet Take 2 tablets (40 mEq total) by mouth 2 (two) times daily. Patient taking differently: Take 20 mEq by mouth daily as needed (when taking Lasix).  09/19/16  Yes [name], FNP  aspirin EC  325 MG tablet Take 1 tablet (325 mg total) by mouth 2 (two) times daily. Patient not taking: Reported on 09/16/2017 04/07/17 04/07/18  Nicholes Mango, MD  Vitamin D, Ergocalciferol, (DRISDOL) 50000 units CAPS capsule TAKE 1 CAPSULE (50,000 UNITS TOTAL) BY MOUTH ONCE A WEEK. Patient not taking: Reported on 09/16/2017 09/30/16   [name], MD    Allergies as of 09/15/2017 - Review Complete 09/15/2017  Allergen Reaction Noted  . Sulfa antibiotics Other (See Comments) 05/12/2013    Family History  Problem Relation Age of Onset  .  Breast cancer Mother 46       deceased 66  . Breast cancer Maternal Aunt 68       deceased 52s  . Prostate cancer Father        deceased 70  . Colon cancer Paternal Aunt 44       deceased 42s    Social History   Socioeconomic History  . Marital status: Widowed    Spouse name: Not on file  . Number of children: Not on file  . Years of education: Not on file  . Highest education level: Not on file  Occupational History  . Not on file  Social Needs  . Financial resource strain: Not on file  . Food insecurity:    Worry: Not on file    Inability: Not on file  . Transportation needs:    Medical: Not on file    Non-medical: Not on file  Tobacco Use  . Smoking status: Never Smoker  . Smokeless tobacco: Never Used  Substance and Sexual Activity  . Alcohol use: No    Alcohol/week: 0.0 standard drinks  . Drug use: No  . Sexual activity: Not on file  Lifestyle  . Physical activity:    Days per week: Not on file    Minutes per session: Not on file  . Stress: Not on file  Relationships  . Social connections:    Talks on phone: Not on file    Gets together: Not on file    Attends religious service: Not on file    Active member of club or organization: Not on file    Attends meetings of clubs or organizations: Not on file    Relationship status: Not on file  . Intimate partner violence:    Fear of current or ex partner: Not on file    Emotionally abused: Not on file    Physically abused: Not on file    Forced sexual activity: Not on file  Other Topics Concern  . Not on file  Social History Narrative  . Not on file    Review of Systems: See HPI, otherwise negative ROS  Physical Exam: BP (!) 181/73   Pulse (!) 59   Temp 97.7 F (36.5 C) (Tympanic)   Resp 20   Ht 5\' 2"  (1.575 m)   Wt 93.3 kg   SpO2 96%   BMI 37.60 kg/m  General:   Alert,  pleasant and cooperative in NAD Head:  Normocephalic and atraumatic. Neck:  Supple; no masses or thyromegaly. Lungs:   Clear throughout to auscultation, normal respiratory effort.    Heart:  +S1, +S2, Regular rate and rhythm, No edema. Abdomen:  Soft, nontender and nondistended. Normal bowel sounds, without guarding, and without rebound.   Neurologic:  Alert and  oriented x4;  grossly normal neurologically.  Impression/Plan: ADILEE LEMME is here for an colonoscopy to be performed for rectal bleeding.  Risks, benefits, limitations,  and alternatives regarding  colonoscopy have been reviewed with the patient.  Questions have been answered.  All parties agreeable.   Jonathon Bellows, MD  09/17/2017, 9:22 AM

## 2017-09-17 NOTE — Progress Notes (Signed)
Pt has been discharged home. Discharged papers given and explained to pt. Pt verbalized understanding. Meds and f/u appointments reviewed. RX given.

## 2017-09-17 NOTE — Anesthesia Post-op Follow-up Note (Signed)
Anesthesia QCDR form completed.        

## 2017-09-17 NOTE — Op Note (Signed)
[facility] Patient Name: Emma [name] Procedure Date: 09/17/2017 9:16 AM MRN: [id number] Account #: [id number] Date of Birth: 12-Nov-1940 Admit Type: Inpatient Age: 77 Room: [facility] ENDO ROOM 3 Gender: Female Note Status: Finalized Procedure:            Colonoscopy Indications:          Rectal bleeding Providers:            Jonathon Bellows MD, MD Referring MD:         Irven Easterly. Kary Kos, MD (Referring MD) Medicines:            Monitored Anesthesia Care Complications:        No immediate complications. Procedure:            Pre-Anesthesia Assessment:                       - Prior to the procedure, a History and Physical was                        performed, and patient medications, allergies and                        sensitivities were reviewed. The patient's tolerance of                        previous anesthesia was reviewed.                       - The risks and benefits of the procedure and the                        sedation options and risks were discussed with the                        patient. All questions were answered and informed                        consent was obtained.                       - ASA Grade Assessment: III - A patient with severe                        systemic disease.                       After obtaining informed consent, the colonoscope was                        passed under direct vision. Throughout the procedure,                        the patient's blood pressure, pulse, and oxygen                        saturations were monitored continuously. The                        Colonoscope was introduced through the anus and                        advanced  to the the cecum, identified by the                        appendiceal orifice, IC valve and transillumination.                        The colonoscopy was performed with ease. The patient                        tolerated the procedure well. The quality of the bowel                   preparation was good. Findings:      A 5 mm polyp was found in the cecum. The polyp was sessile. The polyp       was removed with a cold snare. Resection and retrieval were complete.      Non-bleeding internal hemorrhoids were found during retroflexion. The       hemorrhoids were large and Grade I (internal hemorrhoids that do not       prolapse).      The exam was otherwise without abnormality on direct and retroflexion       views. Impression:           - One 5 mm polyp in the cecum, removed with a cold                        snare. Resected and retrieved.                       - Non-bleeding internal hemorrhoids.                       - The examination was otherwise normal on direct and                        retroflexion views. Recommendation:       - Return patient to hospital ward for ongoing care.                       - Advance diet as tolerated.                       - Continue present medications.                       - Await pathology results.                       - Follow up with Dr Armida Sans as an outpatient and                        banding of hemorroids can be considered since issue has                        been recurrent .                       I will sign out please call with questions Procedure Code(s):    --- Professional ---                       [phone], Colonoscopy, flexible; with removal of  tumor(s),                        polyp(s), or other lesion(s) by snare technique Diagnosis Code(s):    --- Professional ---                       D12.0, Benign neoplasm of cecum                       K62.5, Hemorrhage of anus and rectum                       K64.0, First degree hemorrhoids CPT copyright 2017 American Medical Association. All rights reserved. The codes documented in this report are preliminary and upon coder review may  be revised to meet current compliance requirements. Jonathon Bellows, MD Jonathon Bellows MD, MD 09/17/2017 9:51:59 AM This report has  been signed electronically. Number of Addenda: 0 Note Initiated On: 09/17/2017 9:16 AM Scope Withdrawal Time: 0 hours 13 minutes 17 seconds  Total Procedure Duration: 0 hours 16 minutes 27 seconds       [facility]

## 2017-09-17 NOTE — Discharge Summary (Signed)
[place] [facility] [place] NAME: Emma [name]    MR#:  [id number]  DATE OF BIRTH:  August 09, 1940  DATE OF ADMISSION:  09/15/2017 ADMITTING PHYSICIAN: Loletha Grayer, MD  DATE OF DISCHARGE: 09/17/2017   PRIMARY CARE PHYSICIAN: [name], MD    ADMISSION DIAGNOSIS:  Bright red blood per rectum [K62.5]  DISCHARGE DIAGNOSIS:  Active Problems:   Lower GI bleed   SECONDARY DIAGNOSIS:   Past Medical History:  Diagnosis Date  . Aromatase inhibitor use   . Breast cancer ([place]) 2014   left breast cancer/radiation  . Breast cancer, left breast ([place]) 06/19/2014  . CHF (congestive heart failure) (HCC)    recent sepsis  . Chronic kidney disease   . Dizziness   . Dyspnea    recently while admitted  . GERD (gastroesophageal reflux disease)   . Hearing loss   . History of hiatal hernia   . History of kidney stones   . HOH (hard of hearing)    severe  . Hypertension   . Hypothyroidism   . Leg DVT (deep venous thromboembolism), acute ([place]) 07/2015   left leg after fracture  . 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 Pain    chest pain while admitted  . Personal history of radiation therapy 2015   left breast ca  . Thyroid disease     HOSPITAL COURSE:   1. 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 Serial hemoglobins. Case discussed with Dr. Vicente Males gastroenterology  give liquid diet tonight. Advised to stop taking Advil. Already stopped aspirin. Advised not to take meloxicam and/or other anti-inflammatories in the future. Colonoscopy done, no active bleeding, internal hemorrhoids noted.  Suggested to follow with GI clinic. 2. Depression. Patient would like to start low-dose Celexa after speaking with the daughter about this. 3. Fungal infection under breast. Nystatin powder. 4. Hypertension on labetalol and losartan HCT. 5. Hypokalemia on potassium supplementation. Can consider discontinuing the HCTZ. 6. History  of breast cancer on anastrozole 7.  Carcinoid syndrome-octreotide injection given as advised by Dr. Rogue Bussing. DISCHARGE CONDITIONS:   Stable.  CONSULTS OBTAINED:  Treatment Team:  Lin Landsman, MD [name], MD Jonathon Bellows, MD  DRUG ALLERGIES:   Allergies  Allergen Reactions  . Sulfa Antibiotics Other (See Comments)    Other reaction(s): Kidney Disorder    DISCHARGE MEDICATIONS:   Allergies as of 09/17/2017      Reactions   Sulfa Antibiotics Other (See Comments)   Other reaction(s): Kidney Disorder      Medication List    STOP taking these medications   aspirin EC 325 MG tablet     TAKE these medications   alendronate 70 MG tablet Commonly known as:  FOSAMAX Take 70 mg by mouth once a week.   anastrozole 1 MG tablet Commonly known as:  ARIMIDEX Take 1 tablet (1 mg total) by mouth daily.   furosemide 40 MG tablet Commonly known as:  LASIX Take 40 mg by mouth daily as needed for fluid or edema.   labetalol 200 MG tablet Commonly known as:  NORMODYNE Take 200 mg by mouth 2 (two) times daily.   losartan-hydrochlorothiazide 100-25 MG tablet Commonly known as:  HYZAAR Take 1 tablet by mouth daily.   meloxicam 15 MG tablet Commonly known as:  MOBIC Take 15 mg by mouth daily.   octreotide 30 MG injection Commonly known as:  SANDOSTATIN LAR Inject 30 mg into the muscle every 28 (  twenty-eight) days.   oxyCODONE 5 MG immediate release tablet Commonly known as:  Oxy IR/ROXICODONE Take 1 tablet (5 mg total) by mouth every 6 (six) hours as needed for severe pain.   potassium chloride SA 20 MEQ tablet Commonly known as:  K-DUR,KLOR-CON Take 2 tablets (40 mEq total) by mouth 2 (two) times daily. What changed:    how much to take  when to take this  reasons to take this   SYNTHROID 88 MCG tablet Generic drug:  levothyroxine Take 88 mcg by mouth daily before breakfast.   Vitamin D (Ergocalciferol) 50000 units Caps capsule Commonly  known as:  DRISDOL TAKE 1 CAPSULE (50,000 UNITS TOTAL) BY MOUTH ONCE A WEEK.        DISCHARGE INSTRUCTIONS:    Follow with GI clinic in 1-2 weeks.  If you experience worsening of your admission symptoms, develop shortness of breath, life threatening emergency, suicidal or homicidal thoughts you must seek medical attention immediately by calling 911 or calling your MD immediately  if symptoms less severe.  You Must read complete instructions/literature along with all the possible adverse reactions/side effects for all the Medicines you take and that have been prescribed to you. Take any new Medicines after you have completely understood and accept all the possible adverse reactions/side effects.   Please note  You were cared for by a hospitalist during your hospital stay. If you have any questions about your discharge medications or the care you received while you were in the hospital after you are discharged, you can call the unit and asked to speak with the hospitalist on call if the hospitalist that took care of you is not available. Once you are discharged, your primary care physician will handle any further medical issues. Please note that NO REFILLS for any discharge medications will be authorized once you are discharged, as it is imperative that you return to your primary care physician (or establish a relationship with a primary care physician if you do not have one) for your aftercare needs so that they can reassess your need for medications and monitor your lab values.    Today   CHIEF COMPLAINT:   Chief Complaint  Patient presents with  . Rectal Bleeding  . Abdominal Pain    HISTORY OF PRESENT ILLNESS:  Emma [name]  is a 77 y.o. female with a known history of small bowel carcinoid tumor, rectal bleeding.  She states he has been passing blood every bowel movement for a few weeks.  Bright red blood per rectum with blood clots are dark red.  She states that she always has  diarrhea.  She states that her cheeks get flushed.  Daughter states that she had a colonoscopy a couple years ago by Dr. Vira Agar and he cauterized something in the colon.  I am unable to get the colonoscopy report.  Hospitalist service was were contacted for further evaluation.  Hemoglobin similar to previous hemoglobins.  VITAL SIGNS:  Blood pressure (!) 141/57, pulse 61, temperature 98.2 F (36.8 C), temperature source Oral, resp. rate 20, height 5\' 2"  (1.575 m), weight 94.5 kg, SpO2 91 %.  I/O:  No intake or output data in the 24 hours ending 09/17/17 1405  PHYSICAL EXAMINATION:  GENERAL:  77 y.o.-year-old patient lying in the bed with no acute distress.  EYES: Pupils equal, round, reactive to light and accommodation. No scleral icterus. Extraocular muscles intact.  HEENT: Head atraumatic, normocephalic. Oropharynx and nasopharynx clear.  NECK:  Supple, no jugular venous  distention. No thyroid enlargement, no tenderness.  LUNGS: Normal breath sounds bilaterally, no wheezing, rales,rhonchi or crepitation. No use of accessory muscles of respiration.  CARDIOVASCULAR: S1, S2 normal. No murmurs, rubs, or gallops.  ABDOMEN: Soft, non-tender, non-distended. Bowel sounds present. No organomegaly or mass.  EXTREMITIES: No pedal edema, cyanosis, or clubbing.  NEUROLOGIC: Cranial nerves II through XII are intact. Muscle strength 5/5 in all extremities. Sensation intact. Gait not checked.  PSYCHIATRIC: The patient is alert and oriented x 3.  SKIN: No obvious rash, lesion, or ulcer.   DATA REVIEW:   CBC Recent Labs  Lab 09/16/17 0422  WBC 7.8  HGB 11.7*  HCT 34.4*  PLT 162    Chemistries  Recent Labs  Lab 09/15/17 1338 09/16/17 0422  NA 134* 137  K 3.4* 4.2  CL 104 105  CO2 22 26  GLUCOSE 126* 131*  BUN 20 18  CREATININE 0.81 0.71  CALCIUM 9.0 8.8*  AST 27  --   ALT 14  --   ALKPHOS 68  --   BILITOT 1.0  --     Cardiac Enzymes No results for input(s): TROPONINI in the last  168 hours.  Microbiology Results  Results for orders placed or performed during the hospital encounter of 04/05/17  Surgical pcr screen     Status: Abnormal   Collection Time: 04/05/17  6:25 AM  Result Value Ref Range Status   MRSA, PCR NEGATIVE NEGATIVE Final   Staphylococcus aureus POSITIVE (A) NEGATIVE Final    Comment: (NOTE) The Xpert SA Assay (FDA approved for NASAL specimens in patients 61 years of age and older), is one component of a comprehensive surveillance program. It is not intended to diagnose infection nor to guide or monitor treatment. Performed at [facility], [address] [place], [place] [address]     RADIOLOGY:  No results found.  EKG:   Orders placed or performed during the hospital encounter of 09/15/17  . EKG 12-Lead  . EKG 12-Lead      Management plans discussed with the patient, family and they are in agreement.  CODE STATUS: Full.    Code Status Orders  (From admission, onward)         Start     Ordered   09/15/17 1827  Full code  Continuous     09/15/17 1827        Code Status History    Date Active Date Inactive Code Status Order ID Comments User Context   04/05/2017 0620 04/07/2017 1906 Full Code [id number]  Harrie Foreman, MD ED   01/25/2016 2004 01/29/2016 2245 Full Code [id number]  Demetrios Loll, MD Inpatient   12/24/2015 2020 12/26/2015 2046 Full Code [id number]  Hower, Aaron Mose, MD ED   12/19/2015 0410 12/21/2015 1843 Full Code [id number]  Harrie Foreman, MD Inpatient   08/26/2015 0123 08/27/2015 2031 Full Code [id number]  Lance Coon, MD ED      TOTAL TIME TAKING CARE OF THIS PATIENT: 35 minutes.    Vaughan Basta M.D on 09/17/2017 at 2:05 PM  Between 7am to 6pm - Pager - [phone]  After 6pm go to [email or web address] - password [email or web address] [facility]  Office  [phone]  CC: Primary care physician; [name], MD   Note: This dictation was prepared with Dragon dictation  along with smaller phrase technology. Any transcriptional errors that result from this process are unintentional.

## 2017-09-17 NOTE — Anesthesia Postprocedure Evaluation (Signed)
Anesthesia Post Note  Patient: Emma [name]  Procedure(s) Performed: COLONOSCOPY WITH PROPOFOL (N/A )  Patient location during evaluation: PACU Anesthesia Type: General Level of consciousness: awake and alert Pain management: pain level controlled Vital Signs Assessment: post-procedure vital signs reviewed and stable Respiratory status: spontaneous breathing, nonlabored ventilation and respiratory function stable Cardiovascular status: blood pressure returned to baseline and stable Postop Assessment: no apparent nausea or vomiting Anesthetic complications: no     Last Vitals:  Vitals:   09/17/17 0911 09/17/17 0950  BP: (!) 181/73 (!) 115/52  Pulse: (!) 59 61  Resp: 20 14  Temp: 36.5 C (!) 36.2 C  SpO2: 96% 97%    Last Pain:  Vitals:   09/17/17 0950  TempSrc: Tympanic  PainSc:                  Durenda Hurt

## 2017-09-17 NOTE — Transfer of Care (Signed)
Immediate Anesthesia Transfer of Care Note  Patient: Emma [name]  Procedure(s) Performed: COLONOSCOPY WITH PROPOFOL (N/A )  Patient Location: PACU  Anesthesia Type:General  Level of Consciousness: awake  Airway & Oxygen Therapy: Patient Spontanous Breathing and Patient connected to nasal cannula oxygen  Post-op Assessment: Report given to RN  Post vital signs: Reviewed and stable  Last Vitals:  Vitals Value Taken Time  BP 115/52 09/17/2017  9:57 AM  Temp    Pulse 56 09/17/2017 10:04 AM  Resp 11 09/17/2017 10:04 AM  SpO2 99 % 09/17/2017 10:04 AM  Vitals shown include unvalidated device data.  Last Pain:  Vitals:   09/17/17 0950  TempSrc: Tympanic  PainSc:          Complications: No apparent anesthesia complications

## 2017-09-17 NOTE — Progress Notes (Signed)
Emma [name]   DOB:1940/10/22   HK#:[id number]    Subjective: Patient had colonoscopy prep last night.  Multiple bowel movements.  However no blood as per patient.  No abdominal pain.   Objective:  Vitals:   09/17/17 1040 09/17/17 1258  BP: 139/68 (!) 141/57  Pulse: (!) 57 61  Resp: 20 20  Temp: (!) 97.4 F (36.3 C) 98.2 F (36.8 C)  SpO2: 94% 91%    No intake or output data in the 24 hours ending 09/17/17 2112 GENERAL: Well-nourished well-developed; Alert, no distress and comfortable.  Alone. EYES: no pallor or icterus OROPHARYNX: no thrush or ulceration. NECK: supple, no masses felt LYMPH:  no palpable lymphadenopathy in the cervical, axillary or inguinal regions LUNGS: decreased breath sounds to auscultation at bases and  No wheeze or crackles HEART/CVS: regular rate & rhythm and no murmurs; No lower extremity edema ABDOMEN: abdomen soft, non-tender and normal bowel sounds Musculoskeletal:no cyanosis of digits and no clubbing  PSYCH: alert & oriented x 3 with fluent speech NEURO: no focal motor/sensory deficits SKIN:  no rashes or significant lesions  Labs:  Lab Results  Component Value Date   WBC 7.8 09/16/2017   HGB 11.7 (L) 09/16/2017   HCT 34.4 (L) 09/16/2017   MCV 87.1 09/16/2017   PLT 162 09/16/2017   NEUTROABS 4.0 08/24/2017    Lab Results  Component Value Date   NA 137 09/16/2017   K 4.2 09/16/2017   CL 105 09/16/2017   CO2 26 09/16/2017    Studies:  No results found.  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   Plan octreotide injection IV 500 mcg 1 hour prior to colonoscopy.  Discussed with Dr. Vicente Males.  Discussed with nursing.  #Rectal bleeding-improved overnight.  Awaiting colonoscopy this morning.    #Anemia-Baseline hemoglobin 12-13 currently 11.7.  Fairly stable.  Emma Sickle, MD 09/17/2017

## 2017-09-18 ENCOUNTER — Encounter: Payer: Self-pay | Admitting: Gastroenterology

## 2017-09-19 LAB — SURGICAL PATHOLOGY

## 2017-09-20 ENCOUNTER — Encounter: Payer: Self-pay | Admitting: Gastroenterology

## 2017-09-24 ENCOUNTER — Inpatient Hospital Stay: Payer: Medicare Other | Attending: Internal Medicine

## 2017-09-24 DIAGNOSIS — E340 Carcinoid syndrome: Secondary | ICD-10-CM | POA: Diagnosis present

## 2017-09-24 DIAGNOSIS — K6389 Other specified diseases of intestine: Secondary | ICD-10-CM

## 2017-09-24 DIAGNOSIS — C7A019 Malignant carcinoid tumor of the small intestine, unspecified portion: Secondary | ICD-10-CM | POA: Insufficient documentation

## 2017-09-24 MED ORDER — OCTREOTIDE ACETATE 20 MG IM KIT
20.0000 mg | PACK | Freq: Once | INTRAMUSCULAR | Status: AC
  Administered 2017-09-24: 20 mg via INTRAMUSCULAR

## 2017-10-07 ENCOUNTER — Ambulatory Visit (INDEPENDENT_AMBULATORY_CARE_PROVIDER_SITE_OTHER): Payer: Medicare Other | Admitting: Gastroenterology

## 2017-10-07 ENCOUNTER — Other Ambulatory Visit: Payer: Self-pay

## 2017-10-07 ENCOUNTER — Encounter: Payer: Self-pay | Admitting: Gastroenterology

## 2017-10-07 VITALS — BP 128/74 | HR 60 | Resp 16 | Ht 63.0 in | Wt 209.2 lb

## 2017-10-07 DIAGNOSIS — K640 First degree hemorrhoids: Secondary | ICD-10-CM

## 2017-10-07 DIAGNOSIS — D508 Other iron deficiency anemias: Secondary | ICD-10-CM | POA: Diagnosis not present

## 2017-10-07 MED ORDER — HYDROCORTISONE 2.5 % RE CREA: 1.0000 "application " | TOPICAL_CREAM | Freq: Two times a day (BID) | RECTAL | 1 refills | Status: DC

## 2017-10-07 NOTE — Progress Notes (Signed)
Cephas Darby, MD [address]  [place]  [place], [place] [address]  Main: [phone]  Fax: [phone]    Gastroenterology Consultation  Referring Provider:     [name], MD Primary Care Physician:  [name], MD Primary Gastroenterologist:  Dr. Vicente Males Reason for Consultation:     Rectal bleeding, hemorrhoids        HPI:   Emma [name] is a 77 y.o. female referred by Dr. [name], MD  for consultation & management of rectal bleeding secondary to internal hemorrhoids. 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 She underwent colonoscopy by Dr. Jarrett Soho, found to have large internal hemorrhoids. Her bleeding episode was self-limited which lasted for 5 days in total. She reports that, since colonoscopy her bleeding subsided until yesterday. She reports having 3-4 loose brown bowel movements daily. She does have chronic iron deficiency anemia. She is seen by Dr. Rogue Bussing at the [facility] Other than rectal bleeding, she denies any other hemorrhoidal symptoms. She is also recovering from recent fall and has sustained a right knee fracture in 03/2017. She presented to the clinic in wheelchair. She is referred to me for hemorrhoid ligation NSAIDs: none  Antiplts/Anticoagulants/Anti thrombotics: none  GI Procedures: colonoscopy by Dr. Vicente Males - One 5 mm polyp in the cecum, removed with a cold snare. Resected and retrieved. - Non-bleeding internal hemorrhoids. DIAGNOSIS:  A. COLON POLYP, CECUM; COLD SNARE:  - TUBULAR ADENOMA.  - FECAL DEBRIS.  - NEGATIVE FOR HIGH-GRADE DYSPLASIA AND MALIGNANCY.   Past Medical History:  Diagnosis Date  . Aromatase inhibitor use   . Breast cancer ([facility]) 2014   left breast cancer/radiation  . Breast cancer, left breast ([facility]) 06/19/2014  . CHF (congestive heart failure) ([facility])    recent sepsis  .  Chronic kidney disease   . Dizziness   . Dyspnea    recently while admitted  . GERD (gastroesophageal reflux disease)   . Hearing loss   . History of hiatal hernia   . History of kidney stones   . HOH (hard of hearing)    severe  . Hypertension   . Hypothyroidism   . Leg DVT (deep venous thromboembolism), acute ([facility]) 07/2015   left leg after fracture  . 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 Pain    chest pain while admitted  . Personal history of radiation therapy 2015   left breast ca  . Thyroid disease     Past Surgical History:  Procedure Laterality Date  . ABDOMINAL HYSTERECTOMY     partial  . BREAST LUMPECTOMY Left 01/13/2013   invasive mammary carcinoma, clear margins, LN negative  . BREAST LUMPECTOMY WITH SENTINEL LYMPH NODE BIOPSY Left 2014  . CHOLECYSTECTOMY    . COLONOSCOPY WITH PROPOFOL N/A 09/17/2017   Procedure: COLONOSCOPY WITH PROPOFOL;  Surgeon: Jonathon Bellows, MD;  Location: [facility] ENDOSCOPY;  Service: Gastroenterology;  Laterality: N/A;  . CYSTOSCOPY W/ URETERAL STENT PLACEMENT Right 01/07/2016   Procedure: CYSTOSCOPY WITH STENT REPLACEMENT;  Surgeon: Hollice Espy, MD;  Location: [facility] ORS;  Service: Urology;  Laterality: Right;  . CYSTOSCOPY WITH RETROGRADE PYELOGRAM, URETEROSCOPY AND STENT PLACEMENT Right 12/19/2015   Procedure: CYSTOSCOPY WITH RETROGRADE PYELOGRAM, URETEROSCOPY AND STENT PLACEMENT;  Surgeon: Ardis Hughs, MD;  Location: [facility] ORS;  Service: Urology;  Laterality: Right;  . URETEROSCOPY WITH HOLMIUM LASER LITHOTRIPSY Right 01/07/2016   Procedure: URETEROSCOPY WITH  HOLMIUM LASER LITHOTRIPSY;  Surgeon: Hollice Espy, MD;  Location: [facility] ORS;  Service: Urology;  Laterality: Right;     Current Outpatient Medications:  .  alendronate (FOSAMAX) 70 MG tablet, Take 70 mg by mouth once a week., Disp: , Rfl: 3 .  anastrozole (ARIMIDEX) 1 MG tablet, Take 1 tablet (1 mg total) by mouth daily., Disp: 90 tablet, Rfl: 6 .  furosemide (LASIX) 40 MG tablet, Take  40 mg by mouth daily as needed for fluid or edema. , Disp: , Rfl:  .  labetalol (NORMODYNE) 200 MG tablet, Take 200 mg by mouth 2 (two) times daily. , Disp: , Rfl:  .  levothyroxine (SYNTHROID) 88 MCG tablet, Take 88 mcg by mouth daily before breakfast. , Disp: , Rfl:  .  losartan-hydrochlorothiazide (HYZAAR) 100-25 MG tablet, Take 1 tablet by mouth daily., Disp: , Rfl:  .  octreotide (SANDOSTATIN LAR) 30 MG injection, Inject 30 mg into the muscle every 28 (twenty-eight) days. , Disp: , Rfl:  .  oxyCODONE (OXY IR/ROXICODONE) 5 MG immediate release tablet, Take 1 tablet (5 mg total) by mouth every 6 (six) hours as needed for severe pain., Disp: 15 tablet, Rfl: 0 .  potassium chloride SA (K-DUR,KLOR-CON) 20 MEQ tablet, Take 2 tablets (40 mEq total) by mouth 2 (two) times daily. (Patient taking differently: Take 20 mEq by mouth daily as needed (when taking Lasix). ), Disp: 120 tablet, Rfl: 5 .  Vitamin D, Ergocalciferol, (DRISDOL) 50000 units CAPS capsule, TAKE 1 CAPSULE (50,000 UNITS TOTAL) BY MOUTH ONCE A WEEK., Disp: 24 capsule, Rfl: 1 .  meloxicam (MOBIC) 15 MG tablet, Take 15 mg by mouth daily., Disp: , Rfl:  .  nystatin (MYCOSTATIN/NYSTOP) powder, APPLY TO AFFECTED AREA TWICE A DAY, Disp: , Rfl: 1  Family History  Problem Relation Age of Onset  . Breast cancer Mother 87       deceased 28  . Breast cancer Maternal Aunt 68       deceased 35s  . Prostate cancer Father        deceased 48  . Colon cancer Paternal Aunt 80       deceased 85s     Social History   Tobacco Use  . Smoking status: Never Smoker  . Smokeless tobacco: Never Used  Substance Use Topics  . Alcohol use: No    Alcohol/week: 0.0 standard drinks  . Drug use: No    Allergies as of 10/07/2017 - Review Complete 10/07/2017  Allergen Reaction Noted  . Sulfa antibiotics Other (See Comments) 05/12/2013    Review of Systems:    All systems reviewed and negative except where noted in HPI.   Physical Exam:  BP 128/74  (BP Location: Left Arm, Patient Position: Sitting, Cuff Size: Large)   Pulse 60   Resp 16   Ht 5\' 3"  (1.6 m)   Wt 209 lb 3.2 oz (94.9 kg)   BMI 37.06 kg/m  No LMP recorded. Patient is postmenopausal.  General:   Alert,  Well-developed, well-nourished, pleasant and cooperative in NAD Head:  Normocephalic and atraumatic. Eyes:  Sclera clear, no icterus.   Conjunctiva pink. Ears:  Normal auditory acuity. Nose:  No deformity, discharge, or lesions. Mouth:  No deformity or lesions,oropharynx pink & moist. Neck:  Supple; no masses or thyromegaly. Lungs:  Respirations even and unlabored.  Clear throughout to auscultation.   No wheezes, crackles, or rhonchi. No acute distress. Heart:  Regular rate and rhythm; no murmurs, clicks, rubs, or gallops. Abdomen:  Normal bowel sounds. Soft, obese, non-tender and non-distended without masses,  No guarding or rebound tenderness.   Rectal: normal perianal exam Msk:  Sitting in wheelchair, limited mobility due to recent right knee fracture Pulses:  Normal pulses noted. Extremities:  No clubbing or edema.  No cyanosis. Neurologic:  Alert and oriented x3;  grossly normal neurologically. Skin:  Intact without significant lesions or rashes. No jaundice. Lymph Nodes:  No significant cervical adenopathy. Psych:  Alert and cooperative. Normal mood and affect.  Imaging Studies: reviewed  Assessment and Plan:   KEMIA WENDEL is a 77 y.o. 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  Discussed with her about outpatient hemorrhoid ligation and patient is agreeable Consent obtained To perform hemorrhoid ligation today  Follow up in 2 weeks   Cephas Darby, MD

## 2017-10-07 NOTE — Progress Notes (Signed)
PROCEDURE NOTE: The patient presents with symptomatic grade 1 hemorrhoids, unresponsive to maximal medical therapy, requesting rubber band ligation of his/her hemorrhoidal disease.  All risks, benefits and alternative forms of therapy were described and informed consent was obtained.  In the Left Lateral Decubitus position (if anoscopy is performed) anoscopic examination revealed grade 1 hemorrhoids in the all position(s).   The decision was made to band the RP internal hemorrhoid, and the [place] was used to perform band ligation without complication.  Digital anorectal examination was then performed to assure proper positioning of the band, and to adjust the banded tissue as required.  The patient was discharged home without pain or other issues.  Dietary and behavioral recommendations were given and (if necessary - prescriptions were given), along with follow-up instructions.  The patient will return 2 weeks for follow-up and possible additional banding as required.  No complications were encountered and the patient tolerated the procedure well  Cephas Darby, MD [address]  [place]  [place], [place] [address]  Main: [phone]  Fax: [phone] Pager: [phone]

## 2017-10-15 ENCOUNTER — Inpatient Hospital Stay: Payer: Medicare Other | Attending: Internal Medicine

## 2017-10-15 ENCOUNTER — Other Ambulatory Visit: Payer: Self-pay | Admitting: *Deleted

## 2017-10-15 ENCOUNTER — Ambulatory Visit
Admission: RE | Admit: 2017-10-15 | Discharge: 2017-10-15 | Disposition: A | Discharge status: Home / Self Care | Payer: Medicare Other | Source: Ambulatory Visit | Attending: Internal Medicine | Admitting: Internal Medicine

## 2017-10-15 DIAGNOSIS — R1310 Dysphagia, unspecified: Secondary | ICD-10-CM | POA: Insufficient documentation

## 2017-10-15 DIAGNOSIS — C7A019 Malignant carcinoid tumor of the small intestine, unspecified portion: Secondary | ICD-10-CM

## 2017-10-15 DIAGNOSIS — C787 Secondary malignant neoplasm of liver and intrahepatic bile duct: Secondary | ICD-10-CM | POA: Insufficient documentation

## 2017-10-15 DIAGNOSIS — Q2733 Arteriovenous malformation of digestive system vessel: Secondary | ICD-10-CM | POA: Diagnosis not present

## 2017-10-15 DIAGNOSIS — D500 Iron deficiency anemia secondary to blood loss (chronic): Secondary | ICD-10-CM | POA: Insufficient documentation

## 2017-10-15 DIAGNOSIS — C786 Secondary malignant neoplasm of retroperitoneum and peritoneum: Secondary | ICD-10-CM | POA: Insufficient documentation

## 2017-10-15 DIAGNOSIS — M8580 Other specified disorders of bone density and structure, unspecified site: Secondary | ICD-10-CM | POA: Diagnosis not present

## 2017-10-15 DIAGNOSIS — E340 Carcinoid syndrome: Secondary | ICD-10-CM | POA: Diagnosis present

## 2017-10-15 DIAGNOSIS — I700 Atherosclerosis of aorta: Secondary | ICD-10-CM | POA: Insufficient documentation

## 2017-10-15 LAB — CBC WITH DIFFERENTIAL/PLATELET
BASOS ABS: 0 10*3/uL (ref 0–0.1)
Basophils Relative: 1 %
Eosinophils Absolute: 0.2 10*3/uL (ref 0–0.7)
Eosinophils Relative: 3 %
HEMATOCRIT: 37.2 % (ref 35.0–47.0)
Hemoglobin: 12.6 g/dL (ref 12.0–16.0)
LYMPHS ABS: 0.5 10*3/uL — AB (ref 1.0–3.6)
Lymphocytes Relative: 8 %
MCH: 29.7 pg (ref 26.0–34.0)
MCHC: 33.9 g/dL (ref 32.0–36.0)
MCV: 87.4 fL (ref 80.0–100.0)
MONOS PCT: 10 %
Monocytes Absolute: 0.7 10*3/uL (ref 0.2–0.9)
NEUTROS PCT: 78 %
Neutro Abs: 5.3 10*3/uL (ref 1.4–6.5)
PLATELETS: 241 10*3/uL (ref 150–440)
RBC: 4.26 MIL/uL (ref 3.80–5.20)
RDW: 14.8 % — AB (ref 11.5–14.5)
WBC: 6.8 10*3/uL (ref 3.6–11.0)

## 2017-10-15 LAB — BASIC METABOLIC PANEL
ANION GAP: 9 (ref 5–15)
BUN: 13 mg/dL (ref 8–23)
CO2: 29 mmol/L (ref 22–32)
Calcium: 9.5 mg/dL (ref 8.9–10.3)
Chloride: 98 mmol/L (ref 98–111)
Creatinine, Ser: 0.71 mg/dL (ref 0.44–1.00)
GFR calc Af Amer: >60 mL/min (ref >60)
GLUCOSE: 117 mg/dL — AB (ref 70–99)
Potassium: 3.9 mmol/L (ref 3.5–5.1)
Sodium: 136 mmol/L (ref 135–145)

## 2017-10-15 LAB — IRON AND TIBC
Iron: 37 ug/dL (ref 28–170)
SATURATION RATIOS: 9 % — AB (ref 10.4–31.8)
TIBC: 422 ug/dL (ref 250–450)
UIBC: 385 ug/dL

## 2017-10-15 LAB — FERRITIN: Ferritin: 10 ng/mL — ABNORMAL LOW (ref 11–307)

## 2017-10-15 MED ORDER — IOPAMIDOL (ISOVUE-300) INJECTION 61%
100.0000 mL | Freq: Once | INTRAVENOUS | Status: AC | PRN
  Administered 2017-10-15: 100 mL via INTRAVENOUS

## 2017-10-20 ENCOUNTER — Telehealth: Payer: Self-pay | Admitting: Internal Medicine

## 2017-10-20 NOTE — Telephone Encounter (Signed)
x

## 2017-10-26 ENCOUNTER — Encounter: Payer: Self-pay | Admitting: Internal Medicine

## 2017-10-26 ENCOUNTER — Telehealth: Payer: Self-pay | Admitting: Internal Medicine

## 2017-10-26 ENCOUNTER — Ambulatory Visit: Payer: Medicare Other | Admitting: Gastroenterology

## 2017-10-26 ENCOUNTER — Inpatient Hospital Stay: Payer: Medicare Other

## 2017-10-26 ENCOUNTER — Inpatient Hospital Stay (HOSPITAL_BASED_OUTPATIENT_CLINIC_OR_DEPARTMENT_OTHER): Payer: Medicare Other | Admitting: Internal Medicine

## 2017-10-26 ENCOUNTER — Other Ambulatory Visit: Payer: Self-pay

## 2017-10-26 ENCOUNTER — Telehealth: Payer: Self-pay | Admitting: *Deleted

## 2017-10-26 VITALS — BP 149/76 | HR 61 | Temp 97.6°F | Resp 18 | Ht 63.0 in | Wt 214.0 lb

## 2017-10-26 DIAGNOSIS — R1310 Dysphagia, unspecified: Secondary | ICD-10-CM

## 2017-10-26 DIAGNOSIS — D500 Iron deficiency anemia secondary to blood loss (chronic): Secondary | ICD-10-CM | POA: Diagnosis not present

## 2017-10-26 DIAGNOSIS — C7A019 Malignant carcinoid tumor of the small intestine, unspecified portion: Secondary | ICD-10-CM | POA: Diagnosis not present

## 2017-10-26 DIAGNOSIS — Q2733 Arteriovenous malformation of digestive system vessel: Secondary | ICD-10-CM

## 2017-10-26 DIAGNOSIS — K6389 Other specified diseases of intestine: Secondary | ICD-10-CM

## 2017-10-26 DIAGNOSIS — M8580 Other specified disorders of bone density and structure, unspecified site: Secondary | ICD-10-CM

## 2017-10-26 MED ORDER — OCTREOTIDE ACETATE 20 MG IM KIT
20.0000 mg | PACK | Freq: Once | INTRAMUSCULAR | Status: AC
  Administered 2017-10-26: 20 mg via INTRAMUSCULAR
  Filled 2017-10-26: qty 1

## 2017-10-26 NOTE — Assessment & Plan Note (Addendum)
#   Non- functional Small bowel/mesenteric carcinoid; STAGE IV-on octreotide injections monthly; September 23 CT scan-slight progression of the omental nodules/mesenteric mass.  Otherwise no overt progression clinically stable.  See discussion below regarding dysphagia/tracheal deviation noted on chest x-ray.   # Dysphagia EGD- jan 2016- [Dr.Elliot]-tracheal deviation noted on chest x-ray with[cardiology] recommend DD esophagus; also CT of the chest.  # History of iron deficiency anemia-secondary AV malformation -hemoglobin-12.9 stable; again IDA- will plan IV iron at next visit.   #Left breast cancer stage I ER PR positive; on Arimidex.  No clinical evidence of recurrence.  Stable discussed that we will stop Arimidex in summer 2020.  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  Continue calcium vitamin D.  Stable.  # espohagus Dg; CT chest; follow up in 2 weeks/IV venofer/MD  Spoke to patient's daughter-regarding above.

## 2017-10-26 NOTE — Progress Notes (Signed)
[facility] OFFICE PROGRESS NOTE  [name], [name] [name], [name] [name], [name] [name], [name]  Cancer Staging No matching staging information was found for the patient.   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   # FEB 2018- Ga PET- uptake in liver/ mesenteric mass  # Jan/FEB 2018- Liver MRI- "fat sparing" Bx- neg; no further wu recom  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] s/p Lumpec & RT; ER/PR > 90%; Her2 Neu-NEG; Arimidex; MAY 2016- Mammo-NEG; [until summer 2020]  # IDA s/p IV iron; last March 2014; [place] [Dr.Elliot; Jan 2016] colon angiotele- s/p Argon laser; April 2017- [facility]  # DVT [taken off eliquis DEC 2017]; NOV -DEC 2017 CHF/ UTI with sepsis- stone extracted [Dr.Brandon]  # Thyroid nodule- [facility] s/p Bx [NOV 2016]/ right adnexal mass [stable since 2010]; hard of hearing  MOLECULAR TESTING- Not done  GENETIC TESTING- [place]- ? Sig;  -------------------------------------------------    DIAGNOSIS: [ ] Carcinoid  STAGE: 4       ;GOALS: Palliative  CURRENT/MOST RECENT THERAPY: Monthly Sandostatin      Carcinoid tumor of small intestine, malignant ([facility])      INTERVAL HISTORY:  Emma [name] 77 y.o.  female pleasant patient above history of metastatic carcinoid tumor currently on Sandostatin and also history of early stage breast cancer ER PR positive on Arimidex is here for follow-up.  In the interim patient had a hospitalization because of rectal bleeding had a colonoscopy that was negative.  Thought to  be hemorrhoidal.  Patient was recently eval awaited by cardiology-given shortness of breath and a chest x-ray that showed tracheal deviation to the left.  She also noted to have swelling in the legs which is again chronic.  Denies any worsening diarrhea.  Denies any blood in stools black or stools.  Patient is not on p.o. iron.  However on further questioning patient does complain of difficulty swallowing to solids for the last 2 months.  Mild cough.  No regurgitation.  Review of Systems  Constitutional: Positive for malaise/fatigue. Negative for chills, diaphoresis, fever and weight loss.  HENT: Negative for nosebleeds and sore throat.   Eyes: Negative for double vision.  Respiratory: Positive for cough. Negative for hemoptysis, sputum production, shortness of breath and wheezing.   Cardiovascular: Positive for leg swelling. Negative for chest pain, palpitations and orthopnea.  Gastrointestinal: Negative for abdominal pain, blood in stool, constipation, diarrhea, heartburn, melena, nausea and vomiting.       Dyphagia  Genitourinary: Negative for dysuria, frequency and urgency.  Musculoskeletal: Negative for back pain and joint pain.  Skin: Negative.  Negative for itching and rash.  Neurological: Negative for dizziness, tingling, focal weakness, weakness and headaches.  Endo/Heme/Allergies: Does not bruise/bleed easily.  Psychiatric/Behavioral: Negative for depression. The patient is not nervous/anxious and does not have insomnia.       PAST MEDICAL HISTORY :  Past Medical History:  Diagnosis Date  . Aromatase inhibitor use   . Breast cancer ([facility]) 2014   left breast cancer/radiation  . Breast cancer, left breast ([facility] [facility]) 06/19/2014  . CHF (congestive heart failure) ([facility])  recent sepsis  . Chronic kidney disease   . Dizziness   . Dyspnea    recently while admitted  . GERD (gastroesophageal reflux disease)   . Hearing loss   . History of hiatal hernia   . History of kidney stones    . HOH (hard of hearing)    severe  . Hypertension   . Hypothyroidism   . Leg DVT (deep venous thromboembolism), acute ([facility]) 07/2015   left leg after fracture  . 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 Pain    chest pain while admitted  . Personal history of radiation therapy 2015   left breast ca  . Thyroid disease     PAST SURGICAL HISTORY :   Past Surgical History:  Procedure Laterality Date  . ABDOMINAL HYSTERECTOMY     partial  . BREAST LUMPECTOMY Left 01/13/2013   invasive mammary carcinoma, clear margins, LN negative  . BREAST LUMPECTOMY WITH SENTINEL LYMPH NODE BIOPSY Left 2014  . CHOLECYSTECTOMY    . COLONOSCOPY WITH PROPOFOL N/A 09/17/2017   Procedure: COLONOSCOPY WITH PROPOFOL;  Surgeon: Jonathon Bellows, MD;  Location: [facility] ENDOSCOPY;  Service: Gastroenterology;  Laterality: N/A;  . CYSTOSCOPY W/ URETERAL STENT PLACEMENT Right 01/07/2016   Procedure: CYSTOSCOPY WITH STENT REPLACEMENT;  Surgeon: Hollice Espy, MD;  Location: [facility] ORS;  Service: Urology;  Laterality: Right;  . CYSTOSCOPY WITH RETROGRADE PYELOGRAM, URETEROSCOPY AND STENT PLACEMENT Right 12/19/2015   Procedure: CYSTOSCOPY WITH RETROGRADE PYELOGRAM, URETEROSCOPY AND STENT PLACEMENT;  Surgeon: Ardis Hughs, MD;  Location: [facility] ORS;  Service: Urology;  Laterality: Right;  . URETEROSCOPY WITH HOLMIUM LASER LITHOTRIPSY Right 01/07/2016   Procedure: URETEROSCOPY WITH HOLMIUM LASER LITHOTRIPSY;  Surgeon: Hollice Espy, MD;  Location: [facility] ORS;  Service: Urology;  Laterality: Right;    FAMILY HISTORY :   Family History  Problem Relation Age of Onset  . Breast cancer Mother 38       deceased 29  . Breast cancer Maternal Aunt 68       deceased 43s  . Prostate cancer Father        deceased 41  . Colon cancer Paternal Aunt 80       deceased 25s    SOCIAL HISTORY:   Social History   Tobacco Use  . Smoking status: Never Smoker  . Smokeless tobacco: Never Used  Substance Use Topics  . Alcohol use: No     Alcohol/week: 0.0 standard drinks  . Drug use: No    ALLERGIES:  is allergic to sulfa antibiotics.  MEDICATIONS:  Current Outpatient Medications  Medication Sig Dispense Refill  . alendronate (FOSAMAX) 70 MG tablet Take 70 mg by mouth once a week.  3  . anastrozole (ARIMIDEX) 1 MG tablet Take 1 tablet (1 mg total) by mouth daily. 90 tablet 6  . Calcium Carb-Cholecalciferol (CALCIUM 600 + D PO) Take 1 tablet by mouth daily.    . furosemide (LASIX) 40 MG tablet Take 40 mg by mouth daily as needed for fluid or edema.     Marland Kitchen labetalol (NORMODYNE) 200 MG tablet Take 200 mg by mouth 2 (two) times daily.     Marland Kitchen levothyroxine (SYNTHROID) 88 MCG tablet Take 88 mcg by mouth daily before breakfast.     . losartan-hydrochlorothiazide (HYZAAR) 100-25 MG tablet Take 1 tablet by mouth daily.    Marland Kitchen octreotide (SANDOSTATIN LAR) 30 MG injection Inject 30 mg into the muscle every 28 (twenty-eight) days.     . potassium chloride SA (K-DUR,KLOR-CON) 20 MEQ tablet  Take 2 tablets (40 mEq total) by mouth 2 (two) times daily. (Patient taking differently: Take 20 mEq by mouth daily as needed (when taking Lasix). ) 120 tablet 5  . Vitamin D, Ergocalciferol, (DRISDOL) 50000 units CAPS capsule TAKE 1 CAPSULE (50,000 UNITS TOTAL) BY MOUTH ONCE A WEEK. 24 capsule 1   No current facility-administered medications for this visit.     PHYSICAL EXAMINATION: ECOG PERFORMANCE STATUS: 1 - Symptomatic but completely ambulatory  BP (!) 149/76   Pulse 61   Temp 97.6 F (36.4 C) (Tympanic)   Resp 18   Ht 5' 3" (1.6 m)   Wt 214 lb (97.1 kg)   BMI 37.91 kg/m   Filed Weights   10/26/17 1014  Weight: 214 lb (97.1 kg)    Physical Exam  Constitutional: She is oriented to person, place, and time and well-developed, well-nourished, and in no distress.  Patient is a wheelchair.  She is accompanied by her granddaughter.  Patient is hard of hearing.  HENT:  Head: Normocephalic and atraumatic.  Mouth/Throat: Oropharynx is clear  and moist. No oropharyngeal exudate.  Eyes: Pupils are equal, round, and reactive to light.  Neck: Normal range of motion. Neck supple.  Cardiovascular: Normal rate and regular rhythm.  Pulmonary/Chest: No respiratory distress. She has no wheezes.  Decreased breath sounds in the right upper chest.   Abdominal: Soft. Bowel sounds are normal. She exhibits no distension and no mass. There is no tenderness. There is no rebound and no guarding.  Musculoskeletal: Normal range of motion. She exhibits edema. She exhibits no tenderness.  Neurological: She is alert and oriented to person, place, and time.  Skin: Skin is warm.  Psychiatric: Affect normal.    LABORATORY DATA:  I have reviewed the data as listed    Component Value Date/Time   NA 136 10/15/2017 1006   NA 137 07/12/2013 0841   K 3.9 10/15/2017 1006   K 4.7 07/12/2013 0841   CL 98 10/15/2017 1006   CL 107 07/12/2013 0841   CO2 29 10/15/2017 1006   CO2 20 (L) 07/12/2013 0841   GLUCOSE 117 (H) 10/15/2017 1006   GLUCOSE 122 (H) 07/12/2013 0841   BUN 13 10/15/2017 1006   BUN 18 07/12/2013 0841   CREATININE 0.71 10/15/2017 1006   CREATININE 0.64 05/22/2014 1111   CALCIUM 9.5 10/15/2017 1006   CALCIUM 9.2 07/12/2013 0841   PROT 6.4 (L) 09/15/2017 1338   PROT 7.0 05/22/2014 1111   ALBUMIN 3.6 09/15/2017 1338   ALBUMIN 4.2 05/22/2014 1111   AST 27 09/15/2017 1338   AST 29 05/22/2014 1111   ALT 14 09/15/2017 1338   ALT 17 05/22/2014 1111   ALKPHOS 68 09/15/2017 1338   ALKPHOS 95 05/22/2014 1111   BILITOT 1.0 09/15/2017 1338   BILITOT 0.8 05/22/2014 1111   GFRNONAA >60 10/15/2017 1006   GFRNONAA >60 05/22/2014 1111   GFRAA >60 10/15/2017 1006   GFRAA >60 05/22/2014 1111    No results found for: SPEP, UPEP  Lab Results  Component Value Date   WBC 6.8 10/15/2017   NEUTROABS 5.3 10/15/2017   HGB 12.6 10/15/2017   HCT 37.2 10/15/2017   MCV 87.4 10/15/2017   PLT 241 10/15/2017      Chemistry      Component Value  Date/Time   NA 136 10/15/2017 1006   NA 137 07/12/2013 0841   K 3.9 10/15/2017 1006   K 4.7 07/12/2013 0841   CL 98 10/15/2017 1006  CL 107 07/12/2013 0841   CO2 29 10/15/2017 1006   CO2 20 (L) 07/12/2013 0841   BUN 13 10/15/2017 1006   BUN 18 07/12/2013 0841   CREATININE 0.71 10/15/2017 1006   CREATININE 0.64 05/22/2014 1111      Component Value Date/Time   CALCIUM 9.5 10/15/2017 1006   CALCIUM 9.2 07/12/2013 0841   ALKPHOS 68 09/15/2017 1338   ALKPHOS 95 05/22/2014 1111   AST 27 09/15/2017 1338   AST 29 05/22/2014 1111   ALT 14 09/15/2017 1338   ALT 17 05/22/2014 1111   BILITOT 1.0 09/15/2017 1338   BILITOT 0.8 05/22/2014 1111       RADIOGRAPHIC STUDIES: I have personally reviewed the radiological images as listed and agreed with the findings in the report. No results found.   ASSESSMENT & PLAN:  Carcinoid tumor of small intestine, malignant ([place] [address]) # Non- functional Small bowel/mesenteric carcinoid; STAGE IV-on octreotide injections monthly; September 23 CT scan-slight progression of the omental nodules/mesenteric mass.  Otherwise no overt progression clinically stable.  See discussion below regarding dysphagia/tracheal deviation noted on chest x-ray   # Dysphagia EGD- jan 2016- [Dr.Elliot]-tracheal deviation noted on chest x-ray with[cardiology] recommend DD esophagus; also CT of the chest.  # History of iron deficiency anemia-secondary AV malformation -hemoglobin-12.9 stable; again IDA- will plan IV iron at next visit.   #Left breast cancer stage I ER PR positive; on Arimidex.  No clinical evidence of recurrence.  Stable discussed that we will stop Arimidex in summer 2020.  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  Continue calcium vitamin D.  Stable.  # espohagus Dg; CT chest; follow up in 2 weeks/IV venofer/MD   Orders Placed This Encounter  Procedures  . DG Esophagus    Standing Status:   Future    Standing Expiration Date:   12/27/2018    Order Specific Question:    Reason for Exam (SYMPTOM  OR DIAGNOSIS REQUIRED)    Answer:   dysphagia    Order Specific Question:   Preferred imaging location?    Answer:   [facility]    Order Specific Question:   Radiology Contrast Protocol - do NOT remove file path    Answer:   _0 [email or web address]  . CT CHEST W CONTRAST    Standing Status:   Future    Standing Expiration Date:   10/27/2018    Order Specific Question:   If indicated for the ordered procedure, I authorize the administration of contrast media per Radiology protocol    Answer:   Yes    Order Specific Question:   Preferred imaging location?    Answer:   [facility]    Order Specific Question:   Radiology Contrast Protocol - do NOT remove file path    Answer:   _1 [email or web address]    Order Specific Question:   ** REASON FOR EXAM (FREE TEXT)    Answer:   carcinoid; dysphagia   All questions were answered. The patient knows to call the clinic with any problems, questions or concerns.      [name], MD 10/26/2017 12:34 PM

## 2017-10-26 NOTE — Telephone Encounter (Signed)
Spoke to patient's daughter [name] over the phone-discussed my findings on the CT scan of the abdomen pelvis;  Regards to dysphagia/abnormal chest x-ray recommend esophagogram; CT chest ASAP   [place]-please schedule CT chest/esophagogram ASAP [either  latter this week or early next week]; keep appointment with me in 2 weeks as planned.  Thx

## 2017-10-26 NOTE — Telephone Encounter (Signed)
Daughter - contacted cancer center. After Dr. B sees her mother in clinic today- she would like Dr. B to contact her. [name] can be reached at [phone].

## 2017-11-10 ENCOUNTER — Ambulatory Visit
Admission: RE | Admit: 2017-11-10 | Discharge: 2017-11-10 | Disposition: A | Discharge status: Home / Self Care | Payer: Medicare Other | Source: Ambulatory Visit | Attending: Internal Medicine | Admitting: Internal Medicine

## 2017-11-10 ENCOUNTER — Ambulatory Visit: Payer: Medicare Other

## 2017-11-10 DIAGNOSIS — C7A019 Malignant carcinoid tumor of the small intestine, unspecified portion: Secondary | ICD-10-CM | POA: Diagnosis present

## 2017-11-10 DIAGNOSIS — R1310 Dysphagia, unspecified: Secondary | ICD-10-CM

## 2017-11-10 DIAGNOSIS — R918 Other nonspecific abnormal finding of lung field: Secondary | ICD-10-CM | POA: Diagnosis not present

## 2017-11-10 DIAGNOSIS — C787 Secondary malignant neoplasm of liver and intrahepatic bile duct: Secondary | ICD-10-CM | POA: Insufficient documentation

## 2017-11-10 DIAGNOSIS — I700 Atherosclerosis of aorta: Secondary | ICD-10-CM | POA: Diagnosis not present

## 2017-11-10 DIAGNOSIS — K449 Diaphragmatic hernia without obstruction or gangrene: Secondary | ICD-10-CM | POA: Insufficient documentation

## 2017-11-10 DIAGNOSIS — I2510 Atherosclerotic heart disease of native coronary artery without angina pectoris: Secondary | ICD-10-CM | POA: Diagnosis not present

## 2017-11-10 MED ORDER — IOHEXOL 300 MG/ML  SOLN
75.0000 mL | Freq: Once | INTRAMUSCULAR | Status: AC | PRN
  Administered 2017-11-10: 75 mL via INTRAVENOUS

## 2017-11-11 ENCOUNTER — Telehealth: Payer: Self-pay | Admitting: *Deleted

## 2017-11-11 ENCOUNTER — Inpatient Hospital Stay: Payer: Medicare Other | Admitting: Internal Medicine

## 2017-11-11 ENCOUNTER — Inpatient Hospital Stay: Payer: Medicare Other

## 2017-11-11 NOTE — Telephone Encounter (Signed)
Daughter called asking for physician to return her call regarding her mothers CT and Barium swallow Work # [phone]  DG Esophagus [[id number]] Resulted: 11/10/17 1014  Order Status: Completed Updated: 11/10/17 1016  Narrative:   CLINICAL DATA: Dysphagia.  EXAM: ESOPHOGRAM / BARIUM TABLET STUDY  TECHNIQUE: Standard single column barium enema was performed with thin barium. The patient was observed with fluoroscopy swallowing a 13 mm barium sulphate tablet.  FLUOROSCOPY TIME: Fluoroscopy Time: 0 minutes 36 seconds  Radiation Exposure Index (if provided by the fluoroscopic device): 22 mGy  Number of Acquired Spot Images: 12  COMPARISON: CT 11/10/2017.  FINDINGS: Cervical and thoracic esophagus are widely patent. Small sliding hiatal hernia noted. No reflux noted. No obstructing abnormality identified. Patient could not swallow barium tablet without gagging.  IMPRESSION: Small hiatal hernia, no reflux. No obstructing abnormality.   Electronically Signed By: Marcello Moores Register On: 11/10/2017 10:14   CT CHEST W CONTRAST [[id number]] Resulted: 11/10/17 1132  Order Status: Completed Updated: 11/10/17 1134  Narrative:   CLINICAL DATA: Carcinoid tumor of the small intestines.  EXAM: CT CHEST WITH CONTRAST  TECHNIQUE: Multidetector CT imaging of the chest was performed during intravenous contrast administration.  CONTRAST: 35mL OMNIPAQUE IOHEXOL 300 MG/ML SOLN  COMPARISON: PET-CT 03/21/2016 and CT AP 10/15/2017  FINDINGS: Cardiovascular: Moderate cardiac enlargement. No pericardial effusion. Aortic atherosclerosis. Calcification in the LAD, left circumflex and RCA coronary arteries noted.  Mediastinum/Nodes: Normal appearance of the thyroid gland. The trachea appears patent and is midline. Tiny hiatal hernia. No mediastinal or hilar adenopathy.  Lungs/Pleura: Tiny right upper lobe lung nodule is unchanged measuring 4 mm, image 26/4. Tiny nodule  within the anterolateral left upper lobe measures 3 mm. Unchanged. 2-3 mm nodule in the right lower lobe is identified not confidently seen on the previous exam, image 111/4.  Upper Abdomen: Liver metastasis within the lateral segment measures 1.7 cm, image 135/2. Unchanged. Stable peripherally calcified cystic structure arising from the anterior spleen measuring 2.9 cm, image 131/2.  Musculoskeletal: Spondylosis identified within the thoracic spine.  IMPRESSION: 1. No findings within the chest highly concerning for metastatic disease. 2. There are few tiny nodules identified in both lungs, nonspecific. Within the right lower lobe there is a 2-3 mm nodule which was not confidently identified on the PET-CT from 03/21/2016. 3. Similar appearance of liver metastasis involving the lateral segment of left lobe. 4. Aortic Atherosclerosis (ICD10-I70.0). 5. Multi vessel coronary artery atherosclerotic calcifications.   Electronically Signed By: Kerby Moors M.D. On: 11/10/2017 11:32

## 2017-11-11 NOTE — Telephone Encounter (Signed)
Spoke with patient's daughter. Scans are stable in comparison from last scans per radiology report hiatal hernia identified, but no signs of malignancy identified.   Pt has r/s her apts for Monday. Unable to keep apts today due to her son in law being injured today. Daughter just wanted to be reassured of results rather than waiting until Monday to receive the results.

## 2017-11-12 ENCOUNTER — Inpatient Hospital Stay: Payer: Medicare Other

## 2017-11-12 ENCOUNTER — Inpatient Hospital Stay: Payer: Medicare Other | Admitting: Internal Medicine

## 2017-11-16 ENCOUNTER — Ambulatory Visit: Payer: Medicare Other

## 2017-11-16 ENCOUNTER — Ambulatory Visit: Payer: Medicare Other | Admitting: Internal Medicine

## 2017-11-18 ENCOUNTER — Inpatient Hospital Stay: Payer: Medicare Other

## 2017-11-18 ENCOUNTER — Inpatient Hospital Stay: Payer: Medicare Other | Attending: Internal Medicine | Admitting: Internal Medicine

## 2017-11-18 ENCOUNTER — Other Ambulatory Visit: Payer: Self-pay

## 2017-11-18 VITALS — BP 144/80 | HR 60 | Temp 97.0°F | Resp 18

## 2017-11-18 VITALS — BP 135/78 | HR 61 | Temp 96.7°F | Resp 18 | Wt 216.0 lb

## 2017-11-18 DIAGNOSIS — I130 Hypertensive heart and chronic kidney disease with heart failure and stage 1 through stage 4 chronic kidney disease, or unspecified chronic kidney disease: Secondary | ICD-10-CM | POA: Diagnosis not present

## 2017-11-18 DIAGNOSIS — E039 Hypothyroidism, unspecified: Secondary | ICD-10-CM

## 2017-11-18 DIAGNOSIS — E041 Nontoxic single thyroid nodule: Secondary | ICD-10-CM | POA: Diagnosis not present

## 2017-11-18 DIAGNOSIS — M8580 Other specified disorders of bone density and structure, unspecified site: Secondary | ICD-10-CM | POA: Diagnosis not present

## 2017-11-18 DIAGNOSIS — D509 Iron deficiency anemia, unspecified: Secondary | ICD-10-CM

## 2017-11-18 DIAGNOSIS — E340 Carcinoid syndrome: Secondary | ICD-10-CM | POA: Diagnosis not present

## 2017-11-18 DIAGNOSIS — Z170 Estrogen receptor positive status [ER+]: Secondary | ICD-10-CM

## 2017-11-18 DIAGNOSIS — N189 Chronic kidney disease, unspecified: Secondary | ICD-10-CM | POA: Diagnosis not present

## 2017-11-18 DIAGNOSIS — C7B02 Secondary carcinoid tumors of liver: Secondary | ICD-10-CM

## 2017-11-18 DIAGNOSIS — K6389 Other specified diseases of intestine: Secondary | ICD-10-CM

## 2017-11-18 DIAGNOSIS — C50912 Malignant neoplasm of unspecified site of left female breast: Secondary | ICD-10-CM | POA: Diagnosis not present

## 2017-11-18 DIAGNOSIS — Z79811 Long term (current) use of aromatase inhibitors: Secondary | ICD-10-CM | POA: Diagnosis not present

## 2017-11-18 DIAGNOSIS — C7A019 Malignant carcinoid tumor of the small intestine, unspecified portion: Secondary | ICD-10-CM | POA: Diagnosis not present

## 2017-11-18 MED ORDER — IRON SUCROSE 20 MG/ML IV SOLN
200.0000 mg | Freq: Once | INTRAVENOUS | Status: AC
  Administered 2017-11-18: 200 mg via INTRAVENOUS
  Filled 2017-11-18: qty 10

## 2017-11-18 MED ORDER — SODIUM CHLORIDE 0.9 % IV SOLN
Freq: Once | INTRAVENOUS | Status: AC
  Administered 2017-11-18: 14:00:00 via INTRAVENOUS
  Filled 2017-11-18: qty 250

## 2017-11-18 NOTE — Progress Notes (Signed)
Here for follow up. Per pt overall feeling " pretty good "

## 2017-11-18 NOTE — Progress Notes (Signed)
[facility] OFFICE PROGRESS NOTE  [name], [name] [name], [name] [name], [name] [name], [name]  Cancer Staging No matching staging information was found for the patient.   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  female pleasant patient above history of metastatic carcinoid tumor currently on Sandostatin and also history of early stage breast cancer ER PR positive on Arimidex is here for follow-up/review the results CAT scan ordered for abnormal chest x-ray.  Patient denies any blood in stools  black or stools.  No nausea no vomiting.  No significant diarrhea.  Review of Systems  Constitutional: Positive for malaise/fatigue. Negative for chills, diaphoresis, fever and weight loss.  HENT: Negative for nosebleeds and sore throat.   Eyes: Negative for double vision.  Respiratory: Negative for hemoptysis, sputum production, shortness of breath and wheezing.   Cardiovascular: Negative for chest pain, palpitations and orthopnea.  Gastrointestinal: Negative for abdominal pain, blood in stool, constipation, diarrhea, heartburn, melena, nausea and vomiting.  Genitourinary: Negative for dysuria, frequency and urgency.  Musculoskeletal: Negative for back pain and joint pain.  Skin: Negative.  Negative for itching and rash.  Neurological: Negative for dizziness, tingling, focal weakness, weakness and headaches.  Endo/Heme/Allergies: Does not bruise/bleed easily.  Psychiatric/Behavioral: Negative for depression. The patient is not nervous/anxious and does not have insomnia.       PAST MEDICAL HISTORY :  Past Medical History:  Diagnosis Date  . Aromatase inhibitor use   . Breast cancer ([place]) 2014   left breast cancer/radiation  . Breast cancer, left breast ([place]) 06/19/2014  . CHF (congestive heart failure) (HCC)    recent sepsis  . Chronic kidney disease   . Dizziness   . Dyspnea    recently while admitted  . GERD (gastroesophageal reflux disease)   . Hearing loss   . History of hiatal hernia   . History of kidney stones   . HOH (hard of hearing)    severe  . Hypertension   . Hypothyroidism   . Leg DVT (deep venous thromboembolism), acute ([place]) 07/2015   left leg after fracture  . 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  Pain    chest pain while admitted  . Personal history of radiation therapy 2015   left breast ca  . Thyroid disease     PAST SURGICAL HISTORY :   Past Surgical History:  Procedure Laterality Date  . ABDOMINAL HYSTERECTOMY     partial  . BREAST LUMPECTOMY Left  01/13/2013   invasive mammary carcinoma, clear margins, LN negative  . BREAST LUMPECTOMY WITH SENTINEL LYMPH NODE BIOPSY Left 2014  . CHOLECYSTECTOMY    . COLONOSCOPY WITH PROPOFOL N/A 09/17/2017   Procedure: COLONOSCOPY WITH PROPOFOL;  Surgeon: Jonathon Bellows, MD;  Location: [facility] ENDOSCOPY;  Service: Gastroenterology;  Laterality: N/A;  . CYSTOSCOPY W/ URETERAL STENT PLACEMENT Right 01/07/2016   Procedure: CYSTOSCOPY WITH STENT REPLACEMENT;  Surgeon: Hollice Espy, MD;  Location: [facility] ORS;  Service: Urology;  Laterality: Right;  . CYSTOSCOPY WITH RETROGRADE PYELOGRAM, URETEROSCOPY AND STENT PLACEMENT Right 12/19/2015   Procedure: CYSTOSCOPY WITH RETROGRADE PYELOGRAM, URETEROSCOPY AND STENT PLACEMENT;  Surgeon: Ardis Hughs, MD;  Location: [facility] ORS;  Service: Urology;  Laterality: Right;  . URETEROSCOPY WITH HOLMIUM LASER LITHOTRIPSY Right 01/07/2016   Procedure: URETEROSCOPY WITH HOLMIUM LASER LITHOTRIPSY;  Surgeon: Hollice Espy, MD;  Location: [facility] ORS;  Service: Urology;  Laterality: Right;    FAMILY HISTORY :   Family History  Problem Relation Age of Onset  . Breast cancer Mother 58       deceased 34  . Breast cancer Maternal Aunt 68       deceased 79s  . Prostate cancer Father        deceased 18  . Colon cancer Paternal Aunt 80       deceased 55s    SOCIAL HISTORY:   Social History   Tobacco Use  . Smoking status: Never Smoker  . Smokeless tobacco: Never Used  Substance Use Topics  . Alcohol use: No    Alcohol/week: 0.0 standard drinks  . Drug use: No    ALLERGIES:  is allergic to sulfa antibiotics.  MEDICATIONS:  Current Outpatient Medications  Medication Sig Dispense Refill  . alendronate (FOSAMAX) 70 MG tablet Take 70 mg by mouth once a week.  3  . anastrozole (ARIMIDEX) 1 MG tablet Take 1 tablet (1 mg total) by mouth daily. 90 tablet 6  . Calcium Carb-Cholecalciferol (CALCIUM 600 + D PO) Take 1 tablet by mouth daily.    Marland Kitchen labetalol (NORMODYNE) 200 MG  tablet Take 200 mg by mouth 2 (two) times daily.     Marland Kitchen levothyroxine (SYNTHROID) 88 MCG tablet Take 88 mcg by mouth daily before breakfast.     . losartan-hydrochlorothiazide (HYZAAR) 100-25 MG tablet Take 1 tablet by mouth daily.    Marland Kitchen octreotide (SANDOSTATIN LAR) 30 MG injection Inject 30 mg into the muscle every 28 (twenty-eight) days.     . potassium chloride SA (K-DUR,KLOR-CON) 20 MEQ tablet Take 2 tablets (40 mEq total) by mouth 2 (two) times daily. (Patient taking differently: Take 20 mEq by mouth daily as needed (when taking Lasix). ) 120 tablet 5  . Vitamin D, Ergocalciferol, (DRISDOL) 50000 units CAPS capsule TAKE 1 CAPSULE (50,000 UNITS TOTAL) BY MOUTH ONCE A WEEK. 24 capsule 1  . furosemide (LASIX) 40 MG tablet Take 40 mg by mouth daily as needed for fluid or edema.      No current facility-administered medications for this visit.     PHYSICAL EXAMINATION: ECOG PERFORMANCE STATUS: 1 - Symptomatic but completely ambulatory  BP 135/78 (BP Location: Right Arm, Patient Position: Sitting)  Pulse 61   Temp (!) 96.7 F (35.9 C) (Tympanic)   Resp 18   Wt 216 lb (98 kg) Comment: on wheel chair scale  BMI 38.26 kg/m   Filed Weights   11/18/17 1312  Weight: 216 lb (98 kg)    Physical Exam  Constitutional: She is oriented to person, place, and time and well-developed, well-nourished, and in no distress.  Patient is a wheelchair.  She is accompanied by her granddaughter.  Patient is hard of hearing.  HENT:  Head: Normocephalic and atraumatic.  Mouth/Throat: Oropharynx is clear and moist. No oropharyngeal exudate.  Eyes: Pupils are equal, round, and reactive to light.  Neck: Normal range of motion. Neck supple.  Cardiovascular: Normal rate and regular rhythm.  Pulmonary/Chest: No respiratory distress. She has no wheezes.  Decreased breath sounds in the right upper chest.   Abdominal: Soft. Bowel sounds are normal. She exhibits no distension and no mass. There is no tenderness.  There is no rebound and no guarding.  Musculoskeletal: Normal range of motion. She exhibits edema. She exhibits no tenderness.  Neurological: She is alert and oriented to person, place, and time.  Skin: Skin is warm.  Psychiatric: Affect normal.    LABORATORY DATA:  I have reviewed the data as listed    Component Value Date/Time   NA 136 10/15/2017 1006   NA 137 07/12/2013 0841   K 3.9 10/15/2017 1006   K 4.7 07/12/2013 0841   CL 98 10/15/2017 1006   CL 107 07/12/2013 0841   CO2 29 10/15/2017 1006   CO2 20 (L) 07/12/2013 0841   GLUCOSE 117 (H) 10/15/2017 1006   GLUCOSE 122 (H) 07/12/2013 0841   BUN 13 10/15/2017 1006   BUN 18 07/12/2013 0841   CREATININE 0.71 10/15/2017 1006   CREATININE 0.64 05/22/2014 1111   CALCIUM 9.5 10/15/2017 1006   CALCIUM 9.2 07/12/2013 0841   PROT 6.4 (L) 09/15/2017 1338   PROT 7.0 05/22/2014 1111   ALBUMIN 3.6 09/15/2017 1338   ALBUMIN 4.2 05/22/2014 1111   AST 27 09/15/2017 1338   AST 29 05/22/2014 1111   ALT 14 09/15/2017 1338   ALT 17 05/22/2014 1111   ALKPHOS 68 09/15/2017 1338   ALKPHOS 95 05/22/2014 1111   BILITOT 1.0 09/15/2017 1338   BILITOT 0.8 05/22/2014 1111   GFRNONAA >60 10/15/2017 1006   GFRNONAA >60 05/22/2014 1111   GFRAA >60 10/15/2017 1006   GFRAA >60 05/22/2014 1111    No results found for: SPEP, UPEP  Lab Results  Component Value Date   WBC 6.8 10/15/2017   NEUTROABS 5.3 10/15/2017   HGB 12.6 10/15/2017   HCT 37.2 10/15/2017   MCV 87.4 10/15/2017   PLT 241 10/15/2017      Chemistry      Component Value Date/Time   NA 136 10/15/2017 1006   NA 137 07/12/2013 0841   K 3.9 10/15/2017 1006   K 4.7 07/12/2013 0841   CL 98 10/15/2017 1006   CL 107 07/12/2013 0841   CO2 29 10/15/2017 1006   CO2 20 (L) 07/12/2013 0841   BUN 13 10/15/2017 1006   BUN 18 07/12/2013 0841   CREATININE 0.71 10/15/2017 1006   CREATININE 0.64 05/22/2014 1111      Component Value Date/Time   CALCIUM 9.5 10/15/2017 1006   CALCIUM  9.2 07/12/2013 0841   ALKPHOS 68 09/15/2017 1338   ALKPHOS 95 05/22/2014 1111   AST 27 09/15/2017 1338   AST 29 05/22/2014 1111  ALT 14 09/15/2017 1338   ALT 17 05/22/2014 1111   BILITOT 1.0 09/15/2017 1338   BILITOT 0.8 05/22/2014 1111       RADIOGRAPHIC STUDIES: I have personally reviewed the radiological images as listed and agreed with the findings in the report. No results found.   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  CT scan November 12, 2017-no obvious chest pathology except for 2 to 3 mm right lung nodules [see discussion below];  # Dysphagia EGD- jan 2016- [Dr.Elliot]-tracheal deviation noted on chest x-ray with[cardiology] -improved; Dg esophagus/CT chest- wnl.   #  iron deficiency anemia-secondary AV malformation -hemoglobin-12.9 stable; proceed with venofer every 2 months.  Proceed with iron today.  #Left breast cancer stage I ER PR positive; on Arimidex.  No clinical evidence of recurrence.  STABLE. discussed that we will stop Arimidex in summer 2020.  Stable.  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  Continue calcium vitamin D. STABLE.  # DISPOSITION:  # venofer today #  sandostatin on 10/28 # Follow up MD-  On 11/25 /cbc/bmp/sandostatin/venofer   No orders of the defined types were placed in this encounter.  All questions were answered. The patient knows to call the clinic with any problems, questions or concerns.      [name], MD 11/18/2017 4:57 PM

## 2017-11-18 NOTE — Assessment & Plan Note (Addendum)
#   Non- functional Small bowel/mesenteric carcinoid; STAGE IV-on octreotide injections monthly; September 23 CT scan abdomen pelvis-slight progression of the omental nodules/mesenteric mass.  CT scan November 12, 2017-no obvious chest pathology except for 2 to 3 mm right lung nodules [see discussion below];  # Dysphagia EGD- jan 2016- [Dr.Elliot]-tracheal deviation noted on chest x-ray with[cardiology] -improved; Dg esophagus/CT chest- wnl.   #  iron deficiency anemia-secondary AV malformation -hemoglobin-12.9 stable; proceed with venofer every 2 months.  Proceed with iron today.  #Left breast cancer stage I ER PR positive; on Arimidex.  No clinical evidence of recurrence.  STABLE. discussed that we will stop Arimidex in summer 2020.  Stable.  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  Continue calcium vitamin D. STABLE.  # DISPOSITION:  # venofer today #  sandostatin on 10/28 # Follow up MD-  On 11/25 /cbc/bmp/sandostatin/venofer

## 2017-11-20 ENCOUNTER — Ambulatory Visit (INDEPENDENT_AMBULATORY_CARE_PROVIDER_SITE_OTHER): Payer: Medicare Other | Admitting: Gastroenterology

## 2017-11-20 ENCOUNTER — Encounter: Payer: Self-pay | Admitting: Gastroenterology

## 2017-11-20 VITALS — BP 145/75 | HR 60 | Resp 17 | Ht 63.0 in | Wt 216.0 lb

## 2017-11-20 DIAGNOSIS — K640 First degree hemorrhoids: Secondary | ICD-10-CM | POA: Diagnosis not present

## 2017-11-20 NOTE — Progress Notes (Signed)
PROCEDURE NOTE: The patient presents with symptomatic grade 1 hemorrhoids, unresponsive to maximal medical therapy, requesting rubber band ligation of his/her hemorrhoidal disease.  All risks, benefits and alternative forms of therapy were described and informed consent was obtained.  The decision was made to band the LL internal hemorrhoid, and the [facility] System was used to perform band ligation without complication.  Digital anorectal examination was then performed to assure proper positioning of the band, and to adjust the banded tissue as required.  The patient was discharged home without pain or other issues.  Dietary and behavioral recommendations were given and (if necessary - prescriptions were given), along with follow-up instructions.  The patient will return 2 weeks for follow-up and possible additional banding as required.  No complications were encountered and the patient tolerated the procedure well.    

## 2017-11-23 ENCOUNTER — Inpatient Hospital Stay: Payer: Medicare Other

## 2017-11-23 DIAGNOSIS — K6389 Other specified diseases of intestine: Secondary | ICD-10-CM

## 2017-11-23 DIAGNOSIS — C7A019 Malignant carcinoid tumor of the small intestine, unspecified portion: Secondary | ICD-10-CM | POA: Diagnosis not present

## 2017-11-23 MED ORDER — OCTREOTIDE ACETATE 20 MG IM KIT
20.0000 mg | PACK | Freq: Once | INTRAMUSCULAR | Status: AC
  Administered 2017-11-23: 20 mg via INTRAMUSCULAR

## 2017-11-26 ENCOUNTER — Other Ambulatory Visit: Payer: Self-pay | Admitting: Internal Medicine

## 2017-11-26 DIAGNOSIS — Z79811 Long term (current) use of aromatase inhibitors: Secondary | ICD-10-CM

## 2017-11-26 DIAGNOSIS — C7A019 Malignant carcinoid tumor of the small intestine, unspecified portion: Secondary | ICD-10-CM

## 2017-12-04 ENCOUNTER — Ambulatory Visit: Payer: Medicare Other | Admitting: Gastroenterology

## 2017-12-04 ENCOUNTER — Other Ambulatory Visit: Payer: Self-pay

## 2017-12-14 ENCOUNTER — Ambulatory Visit: Payer: Medicare Other | Admitting: Radiation Oncology

## 2017-12-16 ENCOUNTER — Other Ambulatory Visit: Payer: Self-pay

## 2017-12-16 DIAGNOSIS — D500 Iron deficiency anemia secondary to blood loss (chronic): Secondary | ICD-10-CM

## 2017-12-21 ENCOUNTER — Other Ambulatory Visit: Payer: Self-pay

## 2017-12-21 ENCOUNTER — Inpatient Hospital Stay: Payer: Medicare Other | Attending: Internal Medicine

## 2017-12-21 ENCOUNTER — Inpatient Hospital Stay (HOSPITAL_BASED_OUTPATIENT_CLINIC_OR_DEPARTMENT_OTHER): Payer: Medicare Other | Admitting: Internal Medicine

## 2017-12-21 ENCOUNTER — Inpatient Hospital Stay: Payer: Medicare Other

## 2017-12-21 ENCOUNTER — Encounter: Payer: Self-pay | Admitting: Internal Medicine

## 2017-12-21 VITALS — BP 158/80 | HR 62 | Temp 97.0°F | Resp 20 | Ht 63.0 in | Wt 215.0 lb

## 2017-12-21 DIAGNOSIS — D509 Iron deficiency anemia, unspecified: Secondary | ICD-10-CM | POA: Diagnosis not present

## 2017-12-21 DIAGNOSIS — C7A019 Malignant carcinoid tumor of the small intestine, unspecified portion: Secondary | ICD-10-CM | POA: Diagnosis present

## 2017-12-21 DIAGNOSIS — E340 Carcinoid syndrome: Secondary | ICD-10-CM | POA: Diagnosis not present

## 2017-12-21 DIAGNOSIS — E039 Hypothyroidism, unspecified: Secondary | ICD-10-CM | POA: Insufficient documentation

## 2017-12-21 DIAGNOSIS — D500 Iron deficiency anemia secondary to blood loss (chronic): Secondary | ICD-10-CM

## 2017-12-21 DIAGNOSIS — K6389 Other specified diseases of intestine: Secondary | ICD-10-CM

## 2017-12-21 LAB — COMPREHENSIVE METABOLIC PANEL
ALBUMIN: 4 g/dL (ref 3.5–5.0)
ALK PHOS: 70 U/L (ref 38–126)
ALT: 15 U/L (ref 0–44)
ANION GAP: 9 (ref 5–15)
AST: 26 U/L (ref 15–41)
BILIRUBIN TOTAL: 1 mg/dL (ref 0.3–1.2)
BUN: 14 mg/dL (ref 8–23)
CALCIUM: 9 mg/dL (ref 8.9–10.3)
CO2: 26 mmol/L (ref 22–32)
Chloride: 100 mmol/L (ref 98–111)
Creatinine, Ser: 0.86 mg/dL (ref 0.44–1.00)
GLUCOSE: 128 mg/dL — AB (ref 70–99)
POTASSIUM: 3.7 mmol/L (ref 3.5–5.1)
Sodium: 135 mmol/L (ref 135–145)
Total Protein: 6.7 g/dL (ref 6.5–8.1)

## 2017-12-21 LAB — CBC WITH DIFFERENTIAL/PLATELET
ABS IMMATURE GRANULOCYTES: 0.02 10*3/uL (ref 0.00–0.07)
BASOS PCT: 1 %
Basophils Absolute: 0.1 10*3/uL (ref 0.0–0.1)
EOS ABS: 0.3 10*3/uL (ref 0.0–0.5)
Eosinophils Relative: 5 %
HCT: 38.6 % (ref 36.0–46.0)
Hemoglobin: 12.3 g/dL (ref 12.0–15.0)
IMMATURE GRANULOCYTES: 0 %
LYMPHS ABS: 0.5 10*3/uL — AB (ref 0.7–4.0)
Lymphocytes Relative: 8 %
MCH: 27.5 pg (ref 26.0–34.0)
MCHC: 31.9 g/dL (ref 30.0–36.0)
MCV: 86.2 fL (ref 80.0–100.0)
MONOS PCT: 13 %
Monocytes Absolute: 0.8 10*3/uL (ref 0.1–1.0)
NEUTROS ABS: 4.4 10*3/uL (ref 1.7–7.7)
NEUTROS PCT: 73 %
NRBC: 0 % (ref 0.0–0.2)
PLATELETS: 225 10*3/uL (ref 150–400)
RBC: 4.48 MIL/uL (ref 3.87–5.11)
RDW: 14.6 % (ref 11.5–15.5)
WBC: 6 10*3/uL (ref 4.0–10.5)

## 2017-12-21 LAB — IRON AND TIBC
IRON: 71 ug/dL (ref 28–170)
SATURATION RATIOS: 17 % (ref 10.4–31.8)
TIBC: 419 ug/dL (ref 250–450)
UIBC: 348 ug/dL

## 2017-12-21 LAB — FERRITIN: Ferritin: 21 ng/mL (ref 11–307)

## 2017-12-21 MED ORDER — IRON SUCROSE 20 MG/ML IV SOLN
200.0000 mg | Freq: Once | INTRAVENOUS | Status: AC
  Administered 2017-12-21: 200 mg via INTRAVENOUS
  Filled 2017-12-21: qty 10

## 2017-12-21 MED ORDER — SODIUM CHLORIDE 0.9 % IV SOLN
Freq: Once | INTRAVENOUS | Status: AC
  Administered 2017-12-21: 15:00:00 via INTRAVENOUS
  Filled 2017-12-21: qty 250

## 2017-12-21 MED ORDER — OCTREOTIDE ACETATE 30 MG IM KIT
30.0000 mg | PACK | Freq: Once | INTRAMUSCULAR | Status: AC
  Administered 2017-12-21: 30 mg via INTRAMUSCULAR
  Filled 2017-12-21: qty 1

## 2017-12-21 NOTE — Assessment & Plan Note (Addendum)
#   Non- functional Small bowel/mesenteric carcinoid; STAGE IV-on octreotide injections monthly; September 23 CT scan abdomen pelvis-slight progression of the omental nodules/mesenteric mass.  CT chest scan November 12, 2017-no obvious chest pathology except for 2 to 3 mm right lung nodules [see discussion below]; question progression/worsening on clinical basis [worsening abdominal pain-see discussion below]  #Continue Sandostatin on a monthly basis.  However increase the dose to 30 mg IM.  Patient will likely need to PET scan for further evaluation/consider Lutathera if progression of disease noted.  #  iron deficiency anemia-secondary AV malformation -hemoglobin-12.9 stable; proceed with venofer every 2 months.  Continue IV iron.  #Left breast cancer stage I ER PR positive; on Arimidex.  No clinical evidence of recurrence.  Stable  # DISPOSITION: # venofer/sandostatin today. # Follow up MD-  On 12/23- sandostatin

## 2017-12-21 NOTE — Progress Notes (Signed)
[facility] OFFICE PROGRESS NOTE  [name], [name] [name], [name] [name], [name] [name], [name]  Cancer Staging No matching staging information was found for the patient.   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  female pleasant patient above history of metastatic carcinoid tumor currently on Sandostatin and also history of early stage breast cancer ER PR positive on Arimidex is here for follow-up.  Patient states that most to have dragging pain in the right lower quadrant/right flank for the  last 2 to 3 months.  Comes and goes.  Chronic mild diarrhea.  Not significantly worse.  Also complains of a recent episode of cough sore throat that is currently improved.  Review of Systems  Constitutional: Positive for malaise/fatigue. Negative for chills, diaphoresis, fever and weight loss.  HENT: Negative for nosebleeds and sore throat.   Eyes: Negative for double vision.  Respiratory: Negative for hemoptysis, sputum production, shortness of breath and wheezing.   Cardiovascular: Negative for chest pain, palpitations and orthopnea.  Gastrointestinal: Positive for abdominal pain and diarrhea. Negative for blood in stool, constipation, heartburn, melena, nausea and vomiting.  Genitourinary: Negative for dysuria, frequency and urgency.  Musculoskeletal: Negative for back pain and joint pain.  Skin: Negative.  Negative for itching and rash.  Neurological: Negative for dizziness, tingling, focal weakness, weakness and headaches.  Endo/Heme/Allergies: Does not bruise/bleed easily.  Psychiatric/Behavioral: Negative for depression. The patient is not nervous/anxious and does not have insomnia.       PAST MEDICAL HISTORY :  Past Medical History:  Diagnosis Date  . Aromatase inhibitor use   . Breast cancer ([place]) 2014   left breast cancer/radiation  . Breast cancer, left breast ([place]) 06/19/2014  . CHF (congestive heart failure) ([facility])    recent sepsis  . Chronic kidney disease   . Dizziness   . Dyspnea    recently while admitted  . GERD (gastroesophageal reflux disease)   . Hearing loss   . History of hiatal hernia   . History of kidney stones   . HOH (hard of hearing)    severe  . Hypertension   .  Hypothyroidism   . Leg DVT (deep venous thromboembolism), acute ([place]) 07/2015   left leg after fracture  . 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 Pain    chest pain while admitted  . Personal history of radiation therapy 2015   left breast ca  . Thyroid disease     PAST SURGICAL HISTORY :    Past Surgical History:  Procedure Laterality Date  . ABDOMINAL HYSTERECTOMY     partial  . BREAST LUMPECTOMY Left 01/13/2013   invasive mammary carcinoma, clear margins, LN negative  . BREAST LUMPECTOMY WITH SENTINEL LYMPH NODE BIOPSY Left 2014  . CHOLECYSTECTOMY    . COLONOSCOPY WITH PROPOFOL N/A 09/17/2017   Procedure: COLONOSCOPY WITH PROPOFOL;  Surgeon: Jonathon Bellows, MD;  Location: [facility] ENDOSCOPY;  Service: Gastroenterology;  Laterality: N/A;  . CYSTOSCOPY W/ URETERAL STENT PLACEMENT Right 01/07/2016   Procedure: CYSTOSCOPY WITH STENT REPLACEMENT;  Surgeon: Hollice Espy, MD;  Location: [facility] ORS;  Service: Urology;  Laterality: Right;  . CYSTOSCOPY WITH RETROGRADE PYELOGRAM, URETEROSCOPY AND STENT PLACEMENT Right 12/19/2015   Procedure: CYSTOSCOPY WITH RETROGRADE PYELOGRAM, URETEROSCOPY AND STENT PLACEMENT;  Surgeon: Ardis Hughs, MD;  Location: [facility] ORS;  Service: Urology;  Laterality: Right;  . URETEROSCOPY WITH HOLMIUM LASER LITHOTRIPSY Right 01/07/2016   Procedure: URETEROSCOPY WITH HOLMIUM LASER LITHOTRIPSY;  Surgeon: Hollice Espy, MD;  Location: [facility] ORS;  Service: Urology;  Laterality: Right;    FAMILY HISTORY :   Family History  Problem Relation Age of Onset  . Breast cancer Mother 9       deceased 34  . Breast cancer Maternal Aunt 68       deceased 65s  . Prostate cancer Father        deceased 70  . Colon cancer Paternal Aunt 80       deceased 27s    SOCIAL HISTORY:   Social History   Tobacco Use  . Smoking status: Never Smoker  . Smokeless tobacco: Never Used  Substance Use Topics  . Alcohol use: No    Alcohol/week: 0.0 standard drinks  . Drug use: No    ALLERGIES:  is allergic to sulfa antibiotics.  MEDICATIONS:  Current Outpatient Medications  Medication Sig Dispense Refill  . alendronate (FOSAMAX) 70 MG tablet Take 70 mg by mouth once a week.  3  . anastrozole (ARIMIDEX) 1 MG tablet Take 1 tablet (1 mg total) by mouth daily. 90 tablet 6  .  Calcium Carb-Cholecalciferol (CALCIUM 600 + D PO) Take 1 tablet by mouth daily.    . furosemide (LASIX) 40 MG tablet Take 40 mg by mouth daily as needed for fluid or edema.     Marland Kitchen KLOR-CON M10 10 MEQ tablet     . labetalol (NORMODYNE) 200 MG tablet Take 200 mg by mouth 2 (two) times daily.     Marland Kitchen levothyroxine (SYNTHROID) 88 MCG tablet Take 88 mcg by mouth daily before breakfast.     . losartan-hydrochlorothiazide (HYZAAR) 100-25 MG tablet Take 1 tablet by mouth daily.    Marland Kitchen octreotide (SANDOSTATIN LAR) 30 MG injection Inject 30 mg into the muscle every 28 (twenty-eight) days.     . potassium chloride SA (K-DUR,KLOR-CON) 20 MEQ tablet Take 2 tablets (40 mEq total) by mouth 2 (two) times daily. (Patient taking differently: Take 20 mEq by mouth daily as needed (when taking Lasix). ) 120 tablet 5  . Vitamin D, Ergocalciferol, (DRISDOL) 50000 units CAPS capsule TAKE 1 CAPSULE (50,000 UNITS TOTAL) BY MOUTH ONCE A WEEK. 24 capsule  1   No current facility-administered medications for this visit.     PHYSICAL EXAMINATION: ECOG PERFORMANCE STATUS: 1 - Symptomatic but completely ambulatory  BP (!) 158/80 (Patient Position: Sitting)   Pulse 62   Temp (!) 97 F (36.1 C) (Tympanic)   Resp 20   Ht 5' 3" (1.6 m)   Wt 215 lb (97.5 kg)   BMI 38.09 kg/m   Filed Weights   12/21/17 1324  Weight: 215 lb (97.5 kg)    Physical Exam  Constitutional: She is oriented to person, place, and time and well-developed, well-nourished, and in no distress.  Patient is a wheelchair.  She is alone.  Patient is hard of hearing.  HENT:  Head: Normocephalic and atraumatic.  Mouth/Throat: Oropharynx is clear and moist. No oropharyngeal exudate.  Eyes: Pupils are equal, round, and reactive to light.  Neck: Normal range of motion. Neck supple.  Cardiovascular: Normal rate and regular rhythm.  Pulmonary/Chest: No respiratory distress. She has no wheezes.  Decreased breath sounds in the right upper chest.   Abdominal:  Soft. Bowel sounds are normal. She exhibits no distension and no mass. There is no tenderness. There is no rebound and no guarding.  Musculoskeletal: Normal range of motion. She exhibits no tenderness.  Neurological: She is alert and oriented to person, place, and time.  Skin: Skin is warm.  Psychiatric: Affect normal.    LABORATORY DATA:  I have reviewed the data as listed    Component Value Date/Time   NA 135 12/21/2017 1234   NA 137 07/12/2013 0841   K 3.7 12/21/2017 1234   K 4.7 07/12/2013 0841   CL 100 12/21/2017 1234   CL 107 07/12/2013 0841   CO2 26 12/21/2017 1234   CO2 20 (L) 07/12/2013 0841   GLUCOSE 128 (H) 12/21/2017 1234   GLUCOSE 122 (H) 07/12/2013 0841   BUN 14 12/21/2017 1234   BUN 18 07/12/2013 0841   CREATININE 0.86 12/21/2017 1234   CREATININE 0.64 05/22/2014 1111   CALCIUM 9.0 12/21/2017 1234   CALCIUM 9.2 07/12/2013 0841   PROT 6.7 12/21/2017 1234   PROT 7.0 05/22/2014 1111   ALBUMIN 4.0 12/21/2017 1234   ALBUMIN 4.2 05/22/2014 1111   AST 26 12/21/2017 1234   AST 29 05/22/2014 1111   ALT 15 12/21/2017 1234   ALT 17 05/22/2014 1111   ALKPHOS 70 12/21/2017 1234   ALKPHOS 95 05/22/2014 1111   BILITOT 1.0 12/21/2017 1234   BILITOT 0.8 05/22/2014 1111   GFRNONAA >60 12/21/2017 1234   GFRNONAA >60 05/22/2014 1111   GFRAA >60 12/21/2017 1234   GFRAA >60 05/22/2014 1111    No results found for: SPEP, UPEP  Lab Results  Component Value Date   WBC 6.0 12/21/2017   NEUTROABS 4.4 12/21/2017   HGB 12.3 12/21/2017   HCT 38.6 12/21/2017   MCV 86.2 12/21/2017   PLT 225 12/21/2017      Chemistry      Component Value Date/Time   NA 135 12/21/2017 1234   NA 137 07/12/2013 0841   K 3.7 12/21/2017 1234   K 4.7 07/12/2013 0841   CL 100 12/21/2017 1234   CL 107 07/12/2013 0841   CO2 26 12/21/2017 1234   CO2 20 (L) 07/12/2013 0841   BUN 14 12/21/2017 1234   BUN 18 07/12/2013 0841   CREATININE 0.86 12/21/2017 1234   CREATININE 0.64 05/22/2014 1111       Component Value Date/Time   CALCIUM 9.0 12/21/2017 1234  CALCIUM 9.2 07/12/2013 0841   ALKPHOS 70 12/21/2017 1234   ALKPHOS 95 05/22/2014 1111   AST 26 12/21/2017 1234   AST 29 05/22/2014 1111   ALT 15 12/21/2017 1234   ALT 17 05/22/2014 1111   BILITOT 1.0 12/21/2017 1234   BILITOT 0.8 05/22/2014 1111       RADIOGRAPHIC STUDIES: I have personally reviewed the radiological images as listed and agreed with the findings in the report. No results found.   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  CT chest scan November 12, 2017-no obvious chest pathology except for 2 to 3 mm right lung nodules [see discussion below]; question progression/worsening on clinical basis [worsening abdominal pain-see discussion below]  #Continue Sandostatin on a monthly basis.  However increase the dose to 30 mg IM.  Patient will likely need to PET scan for further evaluation/consider Lutathera if progression of disease noted.  #  iron deficiency anemia-secondary AV malformation -hemoglobin-12.9 stable; proceed with venofer every 2 months.  Continue IV iron.  #Left breast cancer stage I ER PR positive; on Arimidex.  No clinical evidence of recurrence.  Stable  # DISPOSITION: # venofer/sandostatin today. # Follow up MD-  On 12/23- sandostatin   No orders of the defined types were placed in this encounter.  All questions were answered. The patient knows to call the clinic with any problems, questions or concerns.      [name], MD 12/23/2017 9:16 AM

## 2017-12-21 NOTE — Patient Instructions (Signed)
#  Recommend Mucinex 1 pill twice a day-for cough.

## 2018-01-11 ENCOUNTER — Other Ambulatory Visit: Payer: Self-pay

## 2018-01-11 ENCOUNTER — Ambulatory Visit
Admission: RE | Admit: 2018-01-11 | Discharge: 2018-01-11 | Disposition: A | Discharge status: Home / Self Care | Payer: Medicare Other | Source: Ambulatory Visit | Attending: Radiation Oncology | Admitting: Radiation Oncology

## 2018-01-11 ENCOUNTER — Encounter: Payer: Self-pay | Admitting: Radiation Oncology

## 2018-01-11 VITALS — BP 135/71 | HR 61 | Temp 96.5°F | Resp 18 | Wt 215.0 lb

## 2018-01-11 DIAGNOSIS — Z923 Personal history of irradiation: Secondary | ICD-10-CM | POA: Diagnosis not present

## 2018-01-11 DIAGNOSIS — Z79811 Long term (current) use of aromatase inhibitors: Secondary | ICD-10-CM | POA: Insufficient documentation

## 2018-01-11 DIAGNOSIS — C50912 Malignant neoplasm of unspecified site of left female breast: Secondary | ICD-10-CM | POA: Diagnosis present

## 2018-01-11 DIAGNOSIS — Z170 Estrogen receptor positive status [ER+]: Secondary | ICD-10-CM | POA: Insufficient documentation

## 2018-01-11 NOTE — Progress Notes (Signed)
Radiation Oncology Follow up Note  Name: Emma [name]   Date:   01/11/2018 MRN:  [id number] DOB: 20-Apr-1940    This 77 y.o. female presents to the clinic today for 4-1/2 year follow-up status post whole breast radiation to her left breast for stage I invasive mammary carcinoma.  REFERRING PROVIDER: [name], MD  HPI: patient is a 77 year old female now out 4-1/2 years having completed whole breast radiation to her left breast for stage I invasive mammary carcinoma ER/PR positive. Seen today in routine follow-up she is doing well. She specifically denies breast tenderness cough or bone pain.Marland Kitchenlast mammogram was back in July have reviewed that it was BI-RADS 2 benign.she's currently on arimadex tongue that well without side effect.  COMPLICATIONS OF TREATMENT: none  FOLLOW UP COMPLIANCE: keeps appointments   PHYSICAL EXAM:  BP 135/71 (BP Location: Right Arm, Patient Position: Sitting)   Pulse 61   Temp (!) 96.5 F (35.8 C) (Tympanic)   Resp 18   Wt 215 lb (97.5 kg)   BMI 38.09 kg/m  Lungs are clear to A&P cardiac examination essentially unremarkable with regular rate and rhythm. No dominant mass or nodularity is noted in either breast in 2 positions examined. Incision is well-healed. No axillary or supraclavicular adenopathy is appreciated. Cosmetic result is excellent.Well-developed well-nourished patient in NAD. HEENT reveals PERLA, EOMI, discs not visualized.  Oral cavity is clear. No oral mucosal lesions are identified. Neck is clear without evidence of cervical or supraclavicular adenopathy. Lungs are clear to A&P. Cardiac examination is essentially unremarkable with regular rate and rhythm without murmur rub or thrill. Abdomen is benign with no organomegaly or masses noted. Motor sensory and DTR levels are equal and symmetric in the upper and lower extremities. Cranial nerves II through XII are grossly intact. Proprioception is intact. No peripheral adenopathy or edema is  identified. No motor or sensory levels are noted. Crude visual fields are within normal range.  RADIOLOGY RESULTS: mammograms reviewed and compatible with the above-stated findings  PLAN: at the present time patient is doing well now for half years out with no evidence of disease. I am going to discontinue follow-up care at this point. I would be happy to reevaluate the patient any time should further treatment be indicated. Patient knows to call with any concerns.  I would like to take this opportunity to thank you for allowing me to participate in the care of your patient.Noreene Filbert, MD

## 2018-01-18 ENCOUNTER — Encounter: Payer: Self-pay | Admitting: Internal Medicine

## 2018-01-18 ENCOUNTER — Inpatient Hospital Stay: Payer: Medicare Other

## 2018-01-18 ENCOUNTER — Inpatient Hospital Stay: Payer: Medicare Other | Attending: Internal Medicine | Admitting: Internal Medicine

## 2018-01-18 ENCOUNTER — Other Ambulatory Visit: Payer: Self-pay

## 2018-01-18 DIAGNOSIS — K6389 Other specified diseases of intestine: Secondary | ICD-10-CM

## 2018-01-18 DIAGNOSIS — C7A019 Malignant carcinoid tumor of the small intestine, unspecified portion: Secondary | ICD-10-CM

## 2018-01-18 DIAGNOSIS — Q2733 Arteriovenous malformation of digestive system vessel: Secondary | ICD-10-CM | POA: Insufficient documentation

## 2018-01-18 DIAGNOSIS — C7B02 Secondary carcinoid tumors of liver: Secondary | ICD-10-CM

## 2018-01-18 DIAGNOSIS — E340 Carcinoid syndrome: Secondary | ICD-10-CM | POA: Diagnosis not present

## 2018-01-18 DIAGNOSIS — D500 Iron deficiency anemia secondary to blood loss (chronic): Secondary | ICD-10-CM | POA: Diagnosis not present

## 2018-01-18 DIAGNOSIS — Z86718 Personal history of other venous thrombosis and embolism: Secondary | ICD-10-CM | POA: Diagnosis not present

## 2018-01-18 MED ORDER — OCTREOTIDE ACETATE 30 MG IM KIT
30.0000 mg | PACK | Freq: Once | INTRAMUSCULAR | Status: AC
  Administered 2018-01-18: 30 mg via INTRAMUSCULAR
  Filled 2018-01-18: qty 1

## 2018-01-18 MED ORDER — NYSTATIN 100000 UNIT/GM EX POWD: CUTANEOUS | 3 refills | Status: DC

## 2018-01-18 NOTE — Assessment & Plan Note (Addendum)
#   Non- functional Small bowel/mesenteric carcinoid; STAGE IV-on octreotide injections monthly; September 23 CT scan abdomen pelvis-slight progression of the omental nodules/mesenteric mass.  Given patient's vague complaints-I think is reasonable to get a gallium PET scan for further evaluation of disease.  #Continue Sandostatin 30 mg IM q. Monthly.  Await gallium PET scan if progressive disease noted would recommend evaluation with nuclear medicine for Lutathera.  Discussed the rationale with the patient and her daughter in detail.  They agree.  #  iron deficiency anemia-secondary AV malformation -hemoglobin-12.9 stable.   #Left breast cancer stage I ER PR positive; on Arimidex.  No clinical evidence of recurrence.  Stable  # DISPOSITION: # Sandostatin today. # Follow up in 1 month- cbc/cmp/Sandstatin; Ga-PET prior- Dr.B

## 2018-01-18 NOTE — Progress Notes (Signed)
[facility] OFFICE PROGRESS NOTE  [name], [name] Emma Graff, [name] Emma Cowman, [name] Emma Ramsay, [name]  Cancer Staging No matching staging information was found for the patient.   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   # FEB 2018- Ga PET- uptake in liver/ mesenteric mass; NOV 2019- increase sandostatin to 34m q monthly.   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  female pleasant patient above history of metastatic carcinoid tumor currently on Sandostatin and also history of early stage breast cancer ER PR positive on Arimidex is here for follow-up.  Patient states her right lower quadrant  abdominal pain has improved.  No diarrhea.  No upper respiratory issues currently.  However as per the daughter patient continues to have fatigue.  Right knee pain mild chronic diarrhea.  Not any worse.   Review of Systems  Constitutional: Positive for malaise/fatigue. Negative for chills, diaphoresis, fever and weight loss.  HENT: Negative for nosebleeds and sore throat.   Eyes: Negative for double vision.  Respiratory: Negative for hemoptysis, sputum production, shortness of breath and wheezing.   Cardiovascular: Negative for chest pain, palpitations and orthopnea.  Gastrointestinal: Positive for abdominal pain. Negative for blood in stool, constipation, heartburn, melena, nausea and vomiting.  Genitourinary: Negative for dysuria, frequency and urgency.  Musculoskeletal: Negative for back pain and joint pain.  Skin: Negative.  Negative for itching and rash.  Neurological: Negative for dizziness, tingling, focal weakness, weakness and headaches.  Endo/Heme/Allergies: Does not bruise/bleed easily.  Psychiatric/Behavioral: Negative for depression. The patient is not nervous/anxious and does not have insomnia.       PAST MEDICAL HISTORY :  Past Medical History:  Diagnosis Date  . Aromatase inhibitor use   . [place] [place]   left breast cancer/radiation  . [place]  . CHF (congestive heart failure) (HCC)    recent sepsis  . Chronic kidney disease   . Dizziness   . Dyspnea    recently while admitted  . GERD (gastroesophageal reflux disease)   . Hearing loss   . History of hiatal hernia   . History of kidney stones   . HOH (hard of hearing)    severe  . Hypertension   .  Hypothyroidism   . Leg DVT (deep venous thromboembolism), acute ([place]) 07/2015   left leg after fracture  . 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 Pain    chest pain while admitted  . Personal history of radiation therapy 2015   left breast ca  . Thyroid disease     PAST  SURGICAL HISTORY :   Past Surgical History:  Procedure Laterality Date  . ABDOMINAL HYSTERECTOMY     partial  . BREAST LUMPECTOMY Left 01/13/2013   invasive mammary carcinoma, clear margins, LN negative  . BREAST LUMPECTOMY WITH SENTINEL LYMPH NODE BIOPSY Left 2014  . CHOLECYSTECTOMY    . COLONOSCOPY WITH PROPOFOL N/A 09/17/2017   Procedure: COLONOSCOPY WITH PROPOFOL;  Surgeon: Jonathon Bellows, MD;  Location: [facility] ENDOSCOPY;  Service: Gastroenterology;  Laterality: N/A;  . CYSTOSCOPY W/ URETERAL STENT PLACEMENT Right 01/07/2016   Procedure: CYSTOSCOPY WITH STENT REPLACEMENT;  Surgeon: Hollice Espy, MD;  Location: [facility] ORS;  Service: Urology;  Laterality: Right;  . CYSTOSCOPY WITH RETROGRADE PYELOGRAM, URETEROSCOPY AND STENT PLACEMENT Right 12/19/2015   Procedure: CYSTOSCOPY WITH RETROGRADE PYELOGRAM, URETEROSCOPY AND STENT PLACEMENT;  Surgeon: Ardis Hughs, MD;  Location: [facility] ORS;  Service: Urology;  Laterality: Right;  . URETEROSCOPY WITH HOLMIUM LASER LITHOTRIPSY Right 01/07/2016   Procedure: URETEROSCOPY WITH HOLMIUM LASER LITHOTRIPSY;  Surgeon: Hollice Espy, MD;  Location: [facility] ORS;  Service: Urology;  Laterality: Right;    FAMILY HISTORY :   Family History  Problem Relation Age of Onset  . Breast cancer Mother 21       deceased 34  . Breast cancer Maternal Aunt 68       deceased 70s  . Prostate cancer Father        deceased 60  . Colon cancer Paternal Aunt 80       deceased 16s    SOCIAL HISTORY:   Social History   Tobacco Use  . Smoking status: Never Smoker  . Smokeless tobacco: Never Used  Substance Use Topics  . Alcohol use: No    Alcohol/week: 0.0 standard drinks  . Drug use: No    ALLERGIES:  is allergic to sulfa antibiotics.  MEDICATIONS:  Current Outpatient Medications  Medication Sig Dispense Refill  . alendronate (FOSAMAX) 70 MG tablet Take 70 mg by mouth once a week.  3  . anastrozole (ARIMIDEX) 1 MG tablet Take 1 tablet (1 mg total) by mouth  daily. 90 tablet 6  . Calcium Carb-Cholecalciferol (CALCIUM 600 + D PO) Take 1 tablet by mouth daily.    . furosemide (LASIX) 40 MG tablet Take 40 mg by mouth daily as needed for fluid or edema.     Marland Kitchen KLOR-CON M10 10 MEQ tablet     . labetalol (NORMODYNE) 200 MG tablet Take 200 mg by mouth 2 (two) times daily.     Marland Kitchen levothyroxine (SYNTHROID) 88 MCG tablet Take 88 mcg by mouth daily before breakfast.     . losartan-hydrochlorothiazide (HYZAAR) 100-25 MG tablet Take 1 tablet by mouth daily.    Marland Kitchen octreotide (SANDOSTATIN LAR) 30 MG injection Inject 30 mg into the muscle every 28 (twenty-eight) days.     . potassium chloride SA (K-DUR,KLOR-CON) 20 MEQ tablet Take 2 tablets (40 mEq total) by mouth 2 (two) times daily. (Patient taking differently: Take 20 mEq by mouth daily as needed (when taking Lasix). ) 120 tablet 5  . Vitamin D, Ergocalciferol, (DRISDOL) 50000 units CAPS capsule TAKE 1 CAPSULE (50,000 UNITS TOTAL) BY MOUTH ONCE A WEEK. 24 capsule  1  . nystatin (MYCOSTATIN/NYSTOP) powder APPLY TO AFFECTED AREA TWICE A DAY 15 g 3   No current facility-administered medications for this visit.    Facility-Administered Medications Ordered in Other Visits  Medication Dose Route Frequency Provider Last Rate Last Dose  . octreotide (SANDOSTATIN LAR) IM injection 30 mg  30 mg Intramuscular Once [name], MD        PHYSICAL EXAMINATION: ECOG PERFORMANCE STATUS: 1 - Symptomatic but completely ambulatory  BP (!) 147/80 (BP Location: Right Arm, Patient Position: Sitting)   Pulse 62   Temp 97.8 F (36.6 C) (Tympanic)   Resp 20   Ht 5' 3"  (1.6 m)   Wt 220 lb (99.8 kg)   BMI 38.97 kg/m   Filed Weights   01/18/18 1321  Weight: 220 lb (99.8 kg)    Physical Exam  Constitutional: She is oriented to person, place, and time and well-developed, well-nourished, and in no distress.  Patient is a wheelchair.  She is alone.  Patient is hard of hearing.  HENT:  Head: Normocephalic and  atraumatic.  Mouth/Throat: Oropharynx is clear and moist. No oropharyngeal exudate.  Eyes: Pupils are equal, round, and reactive to light.  Neck: Normal range of motion. Neck supple.  Cardiovascular: Normal rate and regular rhythm.  Pulmonary/Chest: No respiratory distress. She has no wheezes.  Decreased breath sounds in the right upper chest.   Abdominal: Soft. Bowel sounds are normal. She exhibits no distension and no mass. There is no abdominal tenderness. There is no rebound and no guarding.  Musculoskeletal: Normal range of motion.        General: No tenderness.  Neurological: She is alert and oriented to person, place, and time.  Skin: Skin is warm.  Psychiatric: Affect normal.    LABORATORY DATA:  I have reviewed the data as listed    Component Value Date/Time   NA 135 12/21/2017 1234   NA 137 07/12/2013 0841   K 3.7 12/21/2017 1234   K 4.7 07/12/2013 0841   CL 100 12/21/2017 1234   CL 107 07/12/2013 0841   CO2 26 12/21/2017 1234   CO2 20 (L) 07/12/2013 0841   GLUCOSE 128 (H) 12/21/2017 1234   GLUCOSE 122 (H) 07/12/2013 0841   BUN 14 12/21/2017 1234   BUN 18 07/12/2013 0841   CREATININE 0.86 12/21/2017 1234   CREATININE 0.64 05/22/2014 1111   CALCIUM 9.0 12/21/2017 1234   CALCIUM 9.2 07/12/2013 0841   PROT 6.7 12/21/2017 1234   PROT 7.0 05/22/2014 1111   ALBUMIN 4.0 12/21/2017 1234   ALBUMIN 4.2 05/22/2014 1111   AST 26 12/21/2017 1234   AST 29 05/22/2014 1111   ALT 15 12/21/2017 1234   ALT 17 05/22/2014 1111   ALKPHOS 70 12/21/2017 1234   ALKPHOS 95 05/22/2014 1111   BILITOT 1.0 12/21/2017 1234   BILITOT 0.8 05/22/2014 1111   GFRNONAA >60 12/21/2017 1234   GFRNONAA >60 05/22/2014 1111   GFRAA >60 12/21/2017 1234   GFRAA >60 05/22/2014 1111    No results found for: SPEP, UPEP  Lab Results  Component Value Date   WBC 6.0 12/21/2017   NEUTROABS 4.4 12/21/2017   HGB 12.3 12/21/2017   HCT 38.6 12/21/2017   MCV 86.2 12/21/2017   PLT 225 12/21/2017       Chemistry      Component Value Date/Time   NA 135 12/21/2017 1234   NA 137 07/12/2013 0841   K 3.7 12/21/2017 1234   K 4.7 07/12/2013  0841   CL 100 12/21/2017 1234   CL 107 07/12/2013 0841   CO2 26 12/21/2017 1234   CO2 20 (L) 07/12/2013 0841   BUN 14 12/21/2017 1234   BUN 18 07/12/2013 0841   CREATININE 0.86 12/21/2017 1234   CREATININE 0.64 05/22/2014 1111      Component Value Date/Time   CALCIUM 9.0 12/21/2017 1234   CALCIUM 9.2 07/12/2013 0841   ALKPHOS 70 12/21/2017 1234   ALKPHOS 95 05/22/2014 1111   AST 26 12/21/2017 1234   AST 29 05/22/2014 1111   ALT 15 12/21/2017 1234   ALT 17 05/22/2014 1111   BILITOT 1.0 12/21/2017 1234   BILITOT 0.8 05/22/2014 1111       RADIOGRAPHIC STUDIES: I have personally reviewed the radiological images as listed and agreed with the findings in the report. No results found.   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  Given patient's vague complaints-I think is reasonable to get a gallium PET scan for further evaluation of disease.  #Continue Sandostatin 30 mg IM q. Monthly.  Await gallium PET scan if progressive disease noted would recommend evaluation with nuclear medicine for Lutathera.  Discussed the rationale with the patient and her daughter in detail.  They agree.  #  iron deficiency anemia-secondary AV malformation -hemoglobin-12.9 stable.   #Left breast cancer stage I ER PR positive; on Arimidex.  No clinical evidence of recurrence.  Stable  # DISPOSITION: # Sandostatin today. # Follow up in 1 month- cbc/cmp/Sandstatin; Ga-PET prior- Dr.B   Orders Placed This Encounter  Procedures  . NM PET (NETSPOT GA 67 DOTATATE) SKULL BASE TO MID THIGH    Standing Status:   Future     Standing Expiration Date:   03/22/2019    Order Specific Question:   ** REASON FOR EXAM (FREE TEXT)    Answer:   carcinoid tumor    Order Specific Question:   If indicated for the ordered procedure, I authorize the administration of a radiopharmaceutical per Radiology protocol    Answer:   Yes    Order Specific Question:   Preferred imaging location?    Answer:   [facility]    Order Specific Question:   Radiology Contrast Protocol - do NOT remove file path    Answer:   [email or web address]   All questions were answered. The patient knows to call the clinic with any problems, questions or concerns.      [name], MD 01/18/2018 2:18 PM

## 2018-02-04 ENCOUNTER — Encounter

## 2018-02-04 ENCOUNTER — Ambulatory Visit: Payer: Medicare Other | Admitting: Gastroenterology

## 2018-02-15 ENCOUNTER — Other Ambulatory Visit: Payer: Medicare Other

## 2018-02-15 ENCOUNTER — Other Ambulatory Visit: Payer: Self-pay

## 2018-02-15 ENCOUNTER — Ambulatory Visit: Payer: Medicare Other | Admitting: Internal Medicine

## 2018-02-15 ENCOUNTER — Ambulatory Visit: Payer: Medicare Other

## 2018-02-15 DIAGNOSIS — C7A019 Malignant carcinoid tumor of the small intestine, unspecified portion: Secondary | ICD-10-CM

## 2018-02-18 ENCOUNTER — Encounter
Admission: RE | Admit: 2018-02-18 | Discharge: 2018-02-18 | Disposition: A | Discharge status: Home / Self Care | Payer: Medicare Other | Source: Ambulatory Visit | Attending: Internal Medicine | Admitting: Internal Medicine

## 2018-02-18 DIAGNOSIS — C7A019 Malignant carcinoid tumor of the small intestine, unspecified portion: Secondary | ICD-10-CM | POA: Diagnosis not present

## 2018-02-18 MED ORDER — GALLIUM GA 68 DOTATATE IV KIT
5.4000 | PACK | Freq: Once | INTRAVENOUS | Status: AC
  Administered 2018-02-18: 5.85 via INTRAVENOUS

## 2018-02-19 ENCOUNTER — Other Ambulatory Visit: Payer: Self-pay

## 2018-02-19 ENCOUNTER — Inpatient Hospital Stay: Payer: Medicare Other

## 2018-02-19 ENCOUNTER — Inpatient Hospital Stay: Payer: Medicare Other | Attending: Internal Medicine

## 2018-02-19 ENCOUNTER — Other Ambulatory Visit: Payer: Self-pay | Admitting: Internal Medicine

## 2018-02-19 ENCOUNTER — Ambulatory Visit
Admission: RE | Admit: 2018-02-19 | Discharge: 2018-02-19 | Disposition: A | Discharge status: Home / Self Care | Payer: Medicare Other | Source: Ambulatory Visit | Attending: Internal Medicine | Admitting: Internal Medicine

## 2018-02-19 ENCOUNTER — Encounter: Payer: Self-pay | Admitting: Internal Medicine

## 2018-02-19 ENCOUNTER — Inpatient Hospital Stay (HOSPITAL_BASED_OUTPATIENT_CLINIC_OR_DEPARTMENT_OTHER): Payer: Medicare Other | Admitting: Internal Medicine

## 2018-02-19 ENCOUNTER — Other Ambulatory Visit: Payer: Self-pay | Admitting: Nurse Practitioner

## 2018-02-19 VITALS — BP 129/76 | HR 65 | Temp 97.6°F | Resp 22 | Ht 63.0 in | Wt 222.0 lb

## 2018-02-19 DIAGNOSIS — C7A019 Malignant carcinoid tumor of the small intestine, unspecified portion: Secondary | ICD-10-CM

## 2018-02-19 DIAGNOSIS — D509 Iron deficiency anemia, unspecified: Secondary | ICD-10-CM | POA: Diagnosis not present

## 2018-02-19 DIAGNOSIS — M79604 Pain in right leg: Secondary | ICD-10-CM

## 2018-02-19 DIAGNOSIS — K6389 Other specified diseases of intestine: Secondary | ICD-10-CM

## 2018-02-19 DIAGNOSIS — E340 Carcinoid syndrome: Secondary | ICD-10-CM | POA: Insufficient documentation

## 2018-02-19 LAB — CBC WITH DIFFERENTIAL/PLATELET
Abs Immature Granulocytes: 0.01 10*3/uL (ref 0.00–0.07)
Basophils Absolute: 0 10*3/uL (ref 0.0–0.1)
Basophils Relative: 1 %
Eosinophils Absolute: 0.3 10*3/uL (ref 0.0–0.5)
Eosinophils Relative: 5 %
HCT: 39.7 % (ref 36.0–46.0)
Hemoglobin: 12.9 g/dL (ref 12.0–15.0)
Immature Granulocytes: 0 %
Lymphocytes Relative: 9 %
Lymphs Abs: 0.5 10*3/uL — ABNORMAL LOW (ref 0.7–4.0)
MCH: 28.4 pg (ref 26.0–34.0)
MCHC: 32.5 g/dL (ref 30.0–36.0)
MCV: 87.4 fL (ref 80.0–100.0)
MONO ABS: 0.6 10*3/uL (ref 0.1–1.0)
Monocytes Relative: 11 %
Neutro Abs: 4 10*3/uL (ref 1.7–7.7)
Neutrophils Relative %: 74 %
Platelets: 192 10*3/uL (ref 150–400)
RBC: 4.54 MIL/uL (ref 3.87–5.11)
RDW: 15.3 % (ref 11.5–15.5)
WBC: 5.4 10*3/uL (ref 4.0–10.5)
nRBC: 0 % (ref 0.0–0.2)

## 2018-02-19 LAB — COMPREHENSIVE METABOLIC PANEL
ALT: 17 U/L (ref 0–44)
AST: 25 U/L (ref 15–41)
Albumin: 3.9 g/dL (ref 3.5–5.0)
Alkaline Phosphatase: 64 U/L (ref 38–126)
Anion gap: 11 (ref 5–15)
BUN: 17 mg/dL (ref 8–23)
CO2: 26 mmol/L (ref 22–32)
Calcium: 9 mg/dL (ref 8.9–10.3)
Chloride: 102 mmol/L (ref 98–111)
Creatinine, Ser: 0.86 mg/dL (ref 0.44–1.00)
GFR calc Af Amer: >60 mL/min (ref >60)
GFR calc non Af Amer: >60 mL/min (ref >60)
Glucose, Bld: 146 mg/dL — ABNORMAL HIGH (ref 70–99)
Potassium: 3.2 mmol/L — ABNORMAL LOW (ref 3.5–5.1)
Sodium: 139 mmol/L (ref 135–145)
Total Bilirubin: 1.1 mg/dL (ref 0.3–1.2)
Total Protein: 6.8 g/dL (ref 6.5–8.1)

## 2018-02-19 LAB — IRON AND TIBC
Iron: 111 ug/dL (ref 28–170)
Saturation Ratios: 29 % (ref 10.4–31.8)
TIBC: 383 ug/dL (ref 250–450)
UIBC: 272 ug/dL

## 2018-02-19 LAB — FERRITIN: Ferritin: 24 ng/mL (ref 11–307)

## 2018-02-19 MED ORDER — OCTREOTIDE ACETATE 30 MG IM KIT
30.0000 mg | PACK | Freq: Once | INTRAMUSCULAR | Status: AC
  Administered 2018-02-19: 30 mg via INTRAMUSCULAR
  Filled 2018-02-19: qty 1

## 2018-02-19 NOTE — Assessment & Plan Note (Addendum)
#   Non- functional Small bowel/mesenteric carcinoid; STAGE IV-on octreotide injections monthly' gallium PET scan January 2020-stable/no evidence of obvious progression.  #Given stable disease continue Sandostatin 30 mg IM q. Monthly.  Discussed that would recommend holding off evaluation for Lutathera at this time.  #Iron deficient anemia-question AV malformation.  Clinically stable continue p.o. iron.  #Left breast cancer stage I ER PR positive; on Arimidex.  No clinical evidence of recurrence.  Stable.  #Chronic right lower extremity swelling- check lower extremity venous Dopplers.  # DISPOSITION: cell- [phone]/ [name].  # STAT Right LE dopplers- # Sandostatin today. # Sandostatin in 1 month # Follow up in 2 month- cbc/cmp/Sandstatin;  Dr.B  Addendum: Ultrasound Dopplers -no evidence of any DVT of the right lower extremity.

## 2018-02-19 NOTE — Progress Notes (Signed)
[place] OFFICE PROGRESS NOTE  [name], [name] [name], [name] [name], [name] [name], [name]  Cancer Staging No matching staging information was found for the patient.   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   # FEB 2018- Ga PET- uptake in liver/ mesenteric mass; NOV 2019- increase sandostatin to '30mg'$  q monthly.   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  female pleasant patient above history of metastatic carcinoid tumor currently on Sandostatin and also history of early stage breast cancer ER PR positive on Arimidex is here for follow-up/review results of her gallium PET scan.  Patient  recently complained of worsening shortness of breath for which her diuretic therapy was adjusted.  Her abdominal pain is improved.  However complains of swelling in the right leg.  Complains of pain.    Review of Systems  Constitutional: Positive for malaise/fatigue. Negative for chills, diaphoresis, fever and weight loss.  HENT: Negative for nosebleeds and sore throat.   Eyes: Negative for double vision.  Respiratory: Negative for hemoptysis, sputum production, shortness of breath and wheezing.   Cardiovascular: Negative for chest pain, palpitations and orthopnea.  Gastrointestinal: Negative for blood in stool, constipation, heartburn, melena, nausea and vomiting.  Genitourinary: Negative for dysuria, frequency and urgency.  Musculoskeletal: Negative for back pain and joint pain.  Skin: Negative.  Negative for itching and rash.  Neurological: Negative for dizziness, tingling, focal weakness, weakness and headaches.  Endo/Heme/Allergies: Does not bruise/bleed easily.  Psychiatric/Behavioral: Negative for depression. The patient is not nervous/anxious and does not have insomnia.       PAST MEDICAL HISTORY :  Past Medical History:  Diagnosis Date  . Aromatase inhibitor use   . Breast cancer ([place]) 2014   left breast cancer/radiation  . Breast cancer, left breast ([place]) 06/19/2014  . CHF (congestive heart failure) (HCC)    recent sepsis  . Chronic kidney disease   . Dizziness   . Dyspnea    recently while admitted  . GERD (gastroesophageal reflux disease)   . Hearing loss   . History of hiatal hernia   . History of kidney stones   . HOH (hard of hearing)    severe  . Hypertension   . Hypothyroidism   .  Leg DVT (deep venous thromboembolism), acute ([place]) 07/2015   left leg after fracture  . 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 Pain    chest pain while admitted  . Personal history of radiation therapy 2015   left breast ca  . Thyroid disease     PAST SURGICAL HISTORY :   Past  Surgical History:  Procedure Laterality Date  . ABDOMINAL HYSTERECTOMY     partial  . BREAST LUMPECTOMY Left 01/13/2013   invasive mammary carcinoma, clear margins, LN negative  . BREAST LUMPECTOMY WITH SENTINEL LYMPH NODE BIOPSY Left 2014  . CHOLECYSTECTOMY    . COLONOSCOPY WITH PROPOFOL N/A 09/17/2017   Procedure: COLONOSCOPY WITH PROPOFOL;  Surgeon: Jonathon Bellows, MD;  Location: [facility] ENDOSCOPY;  Service: Gastroenterology;  Laterality: N/A;  . CYSTOSCOPY W/ URETERAL STENT PLACEMENT Right 01/07/2016   Procedure: CYSTOSCOPY WITH STENT REPLACEMENT;  Surgeon: Hollice Espy, MD;  Location: [facility] ORS;  Service: Urology;  Laterality: Right;  . CYSTOSCOPY WITH RETROGRADE PYELOGRAM, URETEROSCOPY AND STENT PLACEMENT Right 12/19/2015   Procedure: CYSTOSCOPY WITH RETROGRADE PYELOGRAM, URETEROSCOPY AND STENT PLACEMENT;  Surgeon: Ardis Hughs, MD;  Location: [facility] ORS;  Service: Urology;  Laterality: Right;  . URETEROSCOPY WITH HOLMIUM LASER LITHOTRIPSY Right 01/07/2016   Procedure: URETEROSCOPY WITH HOLMIUM LASER LITHOTRIPSY;  Surgeon: Hollice Espy, MD;  Location: [facility] ORS;  Service: Urology;  Laterality: Right;    FAMILY HISTORY :   Family History  Problem Relation Age of Onset  . Breast cancer Mother 40       deceased 55  . Breast cancer Maternal Aunt 68       deceased 48s  . Prostate cancer Father        deceased 51  . Colon cancer Paternal Aunt 80       deceased 24s    SOCIAL HISTORY:   Social History   Tobacco Use  . Smoking status: Never Smoker  . Smokeless tobacco: Never Used  Substance Use Topics  . Alcohol use: No    Alcohol/week: 0.0 standard drinks  . Drug use: No    ALLERGIES:  is allergic to sulfa antibiotics.  MEDICATIONS:  Current Outpatient Medications  Medication Sig Dispense Refill  . alendronate (FOSAMAX) 70 MG tablet Take 70 mg by mouth once a week.  3  . anastrozole (ARIMIDEX) 1 MG tablet Take 1 tablet (1 mg total) by mouth daily. 90 tablet 6  . Calcium  Carb-Cholecalciferol (CALCIUM 600 + D PO) Take 1 tablet by mouth daily.    . furosemide (LASIX) 40 MG tablet Take 40 mg by mouth daily as needed for fluid or edema.     Marland Kitchen KLOR-CON M10 10 MEQ tablet     . labetalol (NORMODYNE) 200 MG tablet Take 200 mg by mouth 2 (two) times daily.     Marland Kitchen levothyroxine (SYNTHROID) 88 MCG tablet Take 88 mcg by mouth daily before breakfast.     . losartan-hydrochlorothiazide (HYZAAR) 100-25 MG tablet Take 1 tablet by mouth daily.    Marland Kitchen nystatin (MYCOSTATIN/NYSTOP) powder APPLY TO AFFECTED AREA TWICE A DAY 15 g 3  . octreotide (SANDOSTATIN LAR) 30 MG injection Inject 30 mg into the muscle every 28 (twenty-eight) days.     . potassium chloride SA (K-DUR,KLOR-CON) 20 MEQ tablet Take 2 tablets (40 mEq total) by mouth 2 (two) times daily. (Patient taking differently: Take 20 mEq by mouth daily as needed (when taking Lasix). ) 120 tablet 5  . Vitamin D, Ergocalciferol, (DRISDOL) 50000 units CAPS capsule TAKE 1  CAPSULE (50,000 UNITS TOTAL) BY MOUTH ONCE A WEEK. 24 capsule 1   No current facility-administered medications for this visit.     PHYSICAL EXAMINATION: ECOG PERFORMANCE STATUS: 1 - Symptomatic but completely ambulatory  BP 129/76   Pulse 65   Temp 97.6 F (36.4 C) (Tympanic)   Resp (!) 22   Ht '5\' 3"'$  (1.6 m)   Wt 222 lb (100.7 kg)   BMI 39.33 kg/m   Filed Weights   02/19/18 1057  Weight: 222 lb (100.7 kg)    Physical Exam  Constitutional: She is oriented to person, place, and time and well-developed, well-nourished, and in no distress.  Patient is a wheelchair.  She is alone.  Patient is hard of hearing.  HENT:  Head: Normocephalic and atraumatic.  Mouth/Throat: Oropharynx is clear and moist. No oropharyngeal exudate.  Eyes: Pupils are equal, round, and reactive to light.  Neck: Normal range of motion. Neck supple.  Cardiovascular: Normal rate and regular rhythm.  Pulmonary/Chest: No respiratory distress. She has no wheezes.  Decreased breath  sounds in the right upper chest.   Abdominal: Soft. Bowel sounds are normal. She exhibits no distension and no mass. There is no abdominal tenderness. There is no rebound and no guarding.  Musculoskeletal: Normal range of motion.        General: No tenderness.  Neurological: She is alert and oriented to person, place, and time.  Skin: Skin is warm.  Psychiatric: Affect normal.    LABORATORY DATA:  I have reviewed the data as listed    Component Value Date/Time   NA 139 02/19/2018 1029   NA 137 07/12/2013 0841   K 3.2 (L) 02/19/2018 1029   K 4.7 07/12/2013 0841   CL 102 02/19/2018 1029   CL 107 07/12/2013 0841   CO2 26 02/19/2018 1029   CO2 20 (L) 07/12/2013 0841   GLUCOSE 146 (H) 02/19/2018 1029   GLUCOSE 122 (H) 07/12/2013 0841   BUN 17 02/19/2018 1029   BUN 18 07/12/2013 0841   CREATININE 0.86 02/19/2018 1029   CREATININE 0.64 05/22/2014 1111   CALCIUM 9.0 02/19/2018 1029   CALCIUM 9.2 07/12/2013 0841   PROT 6.8 02/19/2018 1029   PROT 7.0 05/22/2014 1111   ALBUMIN 3.9 02/19/2018 1029   ALBUMIN 4.2 05/22/2014 1111   AST 25 02/19/2018 1029   AST 29 05/22/2014 1111   ALT 17 02/19/2018 1029   ALT 17 05/22/2014 1111   ALKPHOS 64 02/19/2018 1029   ALKPHOS 95 05/22/2014 1111   BILITOT 1.1 02/19/2018 1029   BILITOT 0.8 05/22/2014 1111   GFRNONAA >60 02/19/2018 1029   GFRNONAA >60 05/22/2014 1111   GFRAA >60 02/19/2018 1029   GFRAA >60 05/22/2014 1111    No results found for: SPEP, UPEP  Lab Results  Component Value Date   WBC 5.4 02/19/2018   NEUTROABS 4.0 02/19/2018   HGB 12.9 02/19/2018   HCT 39.7 02/19/2018   MCV 87.4 02/19/2018   PLT 192 02/19/2018      Chemistry      Component Value Date/Time   NA 139 02/19/2018 1029   NA 137 07/12/2013 0841   K 3.2 (L) 02/19/2018 1029   K 4.7 07/12/2013 0841   CL 102 02/19/2018 1029   CL 107 07/12/2013 0841   CO2 26 02/19/2018 1029   CO2 20 (L) 07/12/2013 0841   BUN 17 02/19/2018 1029   BUN 18 07/12/2013 0841    CREATININE 0.86 02/19/2018 1029   CREATININE 0.64 05/22/2014  1111      Component Value Date/Time   CALCIUM 9.0 02/19/2018 1029   CALCIUM 9.2 07/12/2013 0841   ALKPHOS 64 02/19/2018 1029   ALKPHOS 95 05/22/2014 1111   AST 25 02/19/2018 1029   AST 29 05/22/2014 1111   ALT 17 02/19/2018 1029   ALT 17 05/22/2014 1111   BILITOT 1.1 02/19/2018 1029   BILITOT 0.8 05/22/2014 1111       RADIOGRAPHIC STUDIES: I have personally reviewed the radiological images as listed and agreed with the findings in the report. No results found.   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  #Given stable disease continue Sandostatin 30 mg IM q. Monthly.  Discussed that would recommend holding off evaluation for Lutathera at this time.  #Iron deficient anemia-question AV malformation.  Clinically stable continue p.o. iron.  #Left breast cancer stage I ER PR positive; on Arimidex.  No clinical evidence of recurrence.  Stable.  #Chronic right lower extremity swelling- check lower extremity venous Dopplers.  # DISPOSITION: cell- [phone]/ [name].  # STAT Right LE dopplers- # Sandostatin today. # Sandostatin in 1 month # Follow up in 2 month- cbc/cmp/Sandstatin;  Dr.B  Addendum: Ultrasound Dopplers -no evidence of any DVT of the right lower extremity.   No orders of the defined types were placed in this encounter.  All questions were answered. The patient knows to call the clinic with any problems, questions or concerns.      [name], MD 03/21/2018 6:00 PM

## 2018-02-24 ENCOUNTER — Other Ambulatory Visit: Payer: Self-pay | Admitting: Internal Medicine

## 2018-02-24 DIAGNOSIS — Z79811 Long term (current) use of aromatase inhibitors: Secondary | ICD-10-CM

## 2018-03-19 ENCOUNTER — Inpatient Hospital Stay: Payer: Medicare Other | Attending: Internal Medicine

## 2018-03-24 ENCOUNTER — Other Ambulatory Visit: Payer: Self-pay | Admitting: Internal Medicine

## 2018-03-24 DIAGNOSIS — Z79811 Long term (current) use of aromatase inhibitors: Secondary | ICD-10-CM

## 2018-03-25 ENCOUNTER — Inpatient Hospital Stay: Payer: Medicare Other

## 2018-03-26 ENCOUNTER — Inpatient Hospital Stay: Payer: Medicare Other | Attending: Internal Medicine

## 2018-03-26 DIAGNOSIS — C7B02 Secondary carcinoid tumors of liver: Secondary | ICD-10-CM | POA: Diagnosis present

## 2018-03-26 DIAGNOSIS — C7A019 Malignant carcinoid tumor of the small intestine, unspecified portion: Secondary | ICD-10-CM | POA: Diagnosis present

## 2018-03-26 DIAGNOSIS — K6389 Other specified diseases of intestine: Secondary | ICD-10-CM

## 2018-03-26 DIAGNOSIS — E340 Carcinoid syndrome: Secondary | ICD-10-CM | POA: Insufficient documentation

## 2018-03-26 MED ORDER — OCTREOTIDE ACETATE 30 MG IM KIT
30.0000 mg | PACK | Freq: Once | INTRAMUSCULAR | Status: AC
  Administered 2018-03-26: 30 mg via INTRAMUSCULAR

## 2018-04-12 ENCOUNTER — Other Ambulatory Visit: Payer: Self-pay | Admitting: *Deleted

## 2018-04-12 ENCOUNTER — Other Ambulatory Visit: Payer: Self-pay

## 2018-04-12 DIAGNOSIS — C7A019 Malignant carcinoid tumor of the small intestine, unspecified portion: Secondary | ICD-10-CM

## 2018-04-16 ENCOUNTER — Inpatient Hospital Stay: Payer: Medicare Other

## 2018-04-16 ENCOUNTER — Other Ambulatory Visit: Payer: Self-pay

## 2018-04-16 ENCOUNTER — Encounter: Payer: Self-pay | Admitting: Internal Medicine

## 2018-04-16 ENCOUNTER — Inpatient Hospital Stay: Payer: Medicare Other | Attending: Internal Medicine

## 2018-04-16 ENCOUNTER — Inpatient Hospital Stay (HOSPITAL_BASED_OUTPATIENT_CLINIC_OR_DEPARTMENT_OTHER): Payer: Medicare Other | Admitting: Internal Medicine

## 2018-04-16 VITALS — BP 156/87 | HR 60 | Temp 97.9°F | Resp 20 | Ht 63.0 in | Wt 220.0 lb

## 2018-04-16 DIAGNOSIS — Z853 Personal history of malignant neoplasm of breast: Secondary | ICD-10-CM

## 2018-04-16 DIAGNOSIS — M7989 Other specified soft tissue disorders: Secondary | ICD-10-CM | POA: Insufficient documentation

## 2018-04-16 DIAGNOSIS — D509 Iron deficiency anemia, unspecified: Secondary | ICD-10-CM | POA: Insufficient documentation

## 2018-04-16 DIAGNOSIS — E340 Carcinoid syndrome: Secondary | ICD-10-CM | POA: Insufficient documentation

## 2018-04-16 DIAGNOSIS — C7A019 Malignant carcinoid tumor of the small intestine, unspecified portion: Secondary | ICD-10-CM | POA: Insufficient documentation

## 2018-04-16 DIAGNOSIS — C7B02 Secondary carcinoid tumors of liver: Secondary | ICD-10-CM | POA: Diagnosis not present

## 2018-04-16 DIAGNOSIS — R32 Unspecified urinary incontinence: Secondary | ICD-10-CM | POA: Diagnosis not present

## 2018-04-16 DIAGNOSIS — K6389 Other specified diseases of intestine: Secondary | ICD-10-CM

## 2018-04-16 LAB — CBC WITH DIFFERENTIAL/PLATELET
Abs Immature Granulocytes: 0.02 10*3/uL (ref 0.00–0.07)
Basophils Absolute: 0.1 10*3/uL (ref 0.0–0.1)
Basophils Relative: 1 %
Eosinophils Absolute: 0.4 10*3/uL (ref 0.0–0.5)
Eosinophils Relative: 7 %
HCT: 36.9 % (ref 36.0–46.0)
Hemoglobin: 12.1 g/dL (ref 12.0–15.0)
Immature Granulocytes: 0 %
Lymphocytes Relative: 9 %
Lymphs Abs: 0.5 10*3/uL — ABNORMAL LOW (ref 0.7–4.0)
MCH: 29.2 pg (ref 26.0–34.0)
MCHC: 32.8 g/dL (ref 30.0–36.0)
MCV: 88.9 fL (ref 80.0–100.0)
Monocytes Absolute: 0.8 10*3/uL (ref 0.1–1.0)
Monocytes Relative: 14 %
Neutro Abs: 3.9 10*3/uL (ref 1.7–7.7)
Neutrophils Relative %: 69 %
Platelets: 213 10*3/uL (ref 150–400)
RBC: 4.15 MIL/uL (ref 3.87–5.11)
RDW: 14.1 % (ref 11.5–15.5)
WBC: 5.6 10*3/uL (ref 4.0–10.5)
nRBC: 0 % (ref 0.0–0.2)

## 2018-04-16 LAB — COMPREHENSIVE METABOLIC PANEL
ALK PHOS: 75 U/L (ref 38–126)
ALT: 18 U/L (ref 0–44)
AST: 26 U/L (ref 15–41)
Albumin: 3.9 g/dL (ref 3.5–5.0)
Anion gap: 8 (ref 5–15)
BUN: 14 mg/dL (ref 8–23)
CO2: 25 mmol/L (ref 22–32)
Calcium: 9.3 mg/dL (ref 8.9–10.3)
Chloride: 101 mmol/L (ref 98–111)
Creatinine, Ser: 0.76 mg/dL (ref 0.44–1.00)
GFR calc Af Amer: >60 mL/min (ref >60)
GFR calc non Af Amer: >60 mL/min (ref >60)
Glucose, Bld: 125 mg/dL — ABNORMAL HIGH (ref 70–99)
Potassium: 3.7 mmol/L (ref 3.5–5.1)
Sodium: 134 mmol/L — ABNORMAL LOW (ref 135–145)
Total Bilirubin: 1 mg/dL (ref 0.3–1.2)
Total Protein: 6.9 g/dL (ref 6.5–8.1)

## 2018-04-16 MED ORDER — OCTREOTIDE ACETATE 30 MG IM KIT
30.0000 mg | PACK | Freq: Once | INTRAMUSCULAR | Status: AC
  Administered 2018-04-16: 30 mg via INTRAMUSCULAR
  Filled 2018-04-16: qty 1

## 2018-04-16 NOTE — Progress Notes (Signed)
[place] OFFICE PROGRESS NOTE  [name], [name] [name], [name] [name], [name] [name], [name]  Cancer Staging No matching staging information was found for the patient.   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   # FEB 2018- Ga PET- uptake in liver/ mesenteric mass; NOV 2019- increase sandostatin to 28m q monthly.   # Jan/FEB 2018- Liver MRI- "fat sparing" Bx- neg; no further wu recom  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] s/p Lumpec & RT; ER/PR > 90%; Her2 Neu-NEG; Arimidex; MAY 2016- Mammo-NEG; [until summer 2020]  # IDA s/p IV iron; last March 2014; [place] [Dr.Elliot; Jan 2016] colon angiotele- s/p Argon laser; April 2017- Ferrahem  # DVT [taken off eliquis DEC 2017]; NOV -DEC 2017 CHF/ UTI with sepsis- stone extracted [Dr.Brandon]  # Thyroid nodule- MNG s/p Bx [NOV 2016]/ right adnexal mass [stable since 2010]; hard of hearing  MOLECULAR TESTING- Not done  GENETIC TESTING- [place] ? 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  female pleasant patient above history of metastatic carcinoid tumor currently on Sandostatin and also history of early stage breast cancer ER PR positive on Arimidex is here for follow-up.  Patient complains of leg swelling.  Patient  has not been taking her diuretics because of urinary incontinence.  Otherwise denies any back pain.  Denies any chest pain or shortness of breath.    Review of Systems  Constitutional: Positive for malaise/fatigue. Negative for chills, diaphoresis, fever and weight loss.  HENT: Negative for nosebleeds and sore throat.   Eyes: Negative for double vision.  Respiratory: Negative for hemoptysis, sputum production, shortness of breath and wheezing.   Cardiovascular: Negative for chest pain, palpitations and orthopnea.  Gastrointestinal: Negative for blood in stool, constipation, heartburn, melena, nausea and vomiting.  Genitourinary: Negative for dysuria, frequency and urgency.  Musculoskeletal: Negative for back pain and joint pain.  Skin: Negative.  Negative for itching and rash.  Neurological: Negative for dizziness, tingling, focal weakness, weakness and headaches.  Endo/Heme/Allergies: Does not bruise/bleed easily.  Psychiatric/Behavioral: Negative for depression. The patient is not nervous/anxious and does not have insomnia.       PAST MEDICAL HISTORY :  Past Medical History:  Diagnosis Date  . Aromatase inhibitor use   . Breast cancer ([place] 2014   left breast cancer/radiation  . [place] breast [place] 06/[place]  . CHF (congestive heart failure) (HCC)    recent sepsis  . Chronic kidney disease   . Dizziness   . Dyspnea    recently while admitted  . GERD (gastroesophageal reflux disease)   . Hearing loss   . History of hiatal hernia   . History of kidney stones   . HOH (hard of hearing)    severe  . Hypertension   . Hypothyroidism   . Leg DVT (deep venous thromboembolism), acute ([place]  07/2015   left leg after fracture  . 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 Pain    chest pain while admitted  . Personal history of radiation therapy 2015   left breast ca  . Thyroid disease     PAST SURGICAL HISTORY :   Past Surgical History:  Procedure Laterality Date  .  ABDOMINAL HYSTERECTOMY     partial  . BREAST LUMPECTOMY Left 01/13/2013   invasive mammary carcinoma, clear margins, LN negative  . BREAST LUMPECTOMY WITH SENTINEL LYMPH NODE BIOPSY Left 2014  . CHOLECYSTECTOMY    . COLONOSCOPY WITH PROPOFOL N/A 09/17/2017   Procedure: COLONOSCOPY WITH PROPOFOL;  Surgeon: Jonathon Bellows, MD;  Location[facility] ENDOSCOPY;  Service: Gastroenterology;  Laterality: N/A;  . CYSTOSCOPY W/ URETERAL STENT PLACEMENT Right 01/07/2016   Procedure: CYSTOSCOPY WITH STENT REPLACEMENT;  Surgeon: Hollice Espy, MD;  Location: [facility] ORS;  Service: Urology;  Laterality: Right;  . CYSTOSCOPY WITH RETROGRADE PYELOGRAM, URETEROSCOPY AND STENT PLACEMENT Right 12/19/2015   Procedure: CYSTOSCOPY WITH RETROGRADE PYELOGRAM, URETEROSCOPY AND STENT PLACEMENT;  Surgeon: Ardis Hughs, MD;  Location: [facility] ORS;  Service: Urology;  Laterality: Right;  . URETEROSCOPY WITH HOLMIUM LASER LITHOTRIPSY Right 01/07/2016   Procedure: URETEROSCOPY WITH HOLMIUM LASER LITHOTRIPSY;  Surgeon: Hollice Espy, MD;  Location: [facility] ORS;  Service: Urology;  Laterality: Right;    FAMILY HISTORY :   Family History  Problem Relation Age of Onset  . Breast cancer Mother 17       deceased 53  . Breast cancer Maternal Aunt 68       deceased 108s  . Prostate cancer Father        deceased 47  . Colon cancer Paternal Aunt 80       deceased 3s    SOCIAL HISTORY:   Social History   Tobacco Use  . Smoking status: Never Smoker  . Smokeless tobacco: Never Used  Substance Use Topics  . Alcohol use: No    Alcohol/week: 0.0 standard drinks  . Drug use: No    ALLERGIES:  is allergic to sulfa antibiotics.  MEDICATIONS:  Current Outpatient Medications  Medication Sig Dispense Refill  . anastrozole (ARIMIDEX) 1 MG tablet TAKE 1 TABLET BY MOUTH EVERY DAY 90 tablet 5  . Calcium Carb-Cholecalciferol (CALCIUM 600 + D PO) Take 1 tablet by mouth daily.    . furosemide (LASIX) 40 MG tablet Take 40 mg by mouth  daily as needed for fluid or edema.     Marland Kitchen KLOR-CON M10 10 MEQ tablet     . labetalol (NORMODYNE) 200 MG tablet Take 200 mg by mouth 2 (two) times daily.     Marland Kitchen levothyroxine (SYNTHROID) 88 MCG tablet Take 88 mcg by mouth daily before breakfast.     . losartan-hydrochlorothiazide (HYZAAR) 100-25 MG tablet Take 1 tablet by mouth daily.    Marland Kitchen nystatin (MYCOSTATIN/NYSTOP) powder APPLY TO AFFECTED AREA TWICE A DAY 15 g 3  . octreotide (SANDOSTATIN LAR) 30 MG injection Inject 30 mg into the muscle every 28 (twenty-eight) days.     . potassium chloride SA (K-DUR,KLOR-CON) 20 MEQ tablet Take 2 tablets (40 mEq total) by mouth 2 (two) times daily. (Patient taking differently: Take 20 mEq by mouth daily as needed (when taking Lasix). ) 120 tablet 5  . Vitamin D, Ergocalciferol, (DRISDOL) 50000 units CAPS capsule TAKE 1 CAPSULE (50,000 UNITS TOTAL) BY MOUTH ONCE A WEEK. 24 capsule 1  . alendronate (FOSAMAX) 70 MG tablet Take 70 mg by mouth once a  week.  3   No current facility-administered medications for this visit.     PHYSICAL EXAMINATION: ECOG PERFORMANCE STATUS: 1 - Symptomatic but completely ambulatory  BP (!) 156/87 (BP Location: Right Arm, Patient Position: Sitting)   Pulse 60   Temp 97.9 F (36.6 C) (Tympanic)   Resp 20   Ht 5' 3" (1.6 m)   Wt 220 lb (99.8 kg)   BMI 38.97 kg/m   Filed Weights   04/16/18 1106  Weight: 220 lb (99.8 kg)    Physical Exam  Constitutional: She is oriented to person, place, and time and well-developed, well-nourished, and in no distress.  Patient is a wheelchair.  She is alone.  Patient is hard of hearing.  HENT:  Head: Normocephalic and atraumatic.  Mouth/Throat: Oropharynx is clear and moist. No oropharyngeal exudate.  Eyes: Pupils are equal, round, and reactive to light.  Neck: Normal range of motion. Neck supple.  Cardiovascular: Normal rate and regular rhythm.  Pulmonary/Chest: No respiratory distress. She has no wheezes.  Decreased breath sounds  in the right upper chest.   Abdominal: Soft. Bowel sounds are normal. She exhibits no distension and no mass. There is no abdominal tenderness. There is no rebound and no guarding.  Musculoskeletal: Normal range of motion.        General: No tenderness.  Neurological: She is alert and oriented to person, place, and time.  Skin: Skin is warm.  Psychiatric: Affect normal.    LABORATORY DATA:  I have reviewed the data as listed    Component Value Date/Time   NA 134 (L) 04/16/2018 1033   NA 137 07/12/2013 0841   K 3.7 04/16/2018 1033   K 4.7 07/12/2013 0841   CL 101 04/16/2018 1033   CL 107 07/12/2013 0841   CO2 25 04/16/2018 1033   CO2 20 (L) 07/12/2013 0841   GLUCOSE 125 (H) 04/16/2018 1033   GLUCOSE 122 (H) 07/12/2013 0841   BUN 14 04/16/2018 1033   BUN 18 07/12/2013 0841   CREATININE 0.76 04/16/2018 1033   CREATININE 0.64 05/22/2014 1111   CALCIUM 9.3 04/16/2018 1033   CALCIUM 9.2 07/12/2013 0841   PROT 6.9 04/16/2018 1033   PROT 7.0 05/22/2014 1111   ALBUMIN 3.9 04/16/2018 1033   ALBUMIN 4.2 05/22/2014 1111   AST 26 04/16/2018 1033   AST 29 05/22/2014 1111   ALT 18 04/16/2018 1033   ALT 17 05/22/2014 1111   ALKPHOS 75 04/16/2018 1033   ALKPHOS 95 05/22/2014 1111   BILITOT 1.0 04/16/2018 1033   BILITOT 0.8 05/22/2014 1111   GFRNONAA >60 04/16/2018 1033   GFRNONAA >60 05/22/2014 1111   GFRAA >60 04/16/2018 1033   GFRAA >60 05/22/2014 1111    No results found for: SPEP, UPEP  Lab Results  Component Value Date   WBC 5.6 04/16/2018   NEUTROABS 3.9 04/16/2018   HGB 12.1 04/16/2018   HCT 36.9 04/16/2018   MCV 88.9 04/16/2018   PLT 213 04/16/2018      Chemistry      Component Value Date/Time   NA 134 (L) 04/16/2018 1033   NA 137 07/12/2013 0841   K 3.7 04/16/2018 1033   K 4.7 07/12/2013 0841   CL 101 04/16/2018 1033   CL 107 07/12/2013 0841   CO2 25 04/16/2018 1033   CO2 20 (L) 07/12/2013 0841   BUN 14 04/16/2018 1033   BUN 18 07/12/2013 0841    CREATININE 0.76 04/16/2018 1033   CREATININE 0.64 05/22/2014 1111  Component Value Date/Time   CALCIUM 9.3 04/16/2018 1033   CALCIUM 9.2 07/12/2013 0841   ALKPHOS 75 04/16/2018 1033   ALKPHOS 95 05/22/2014 1111   AST 26 04/16/2018 1033   AST 29 05/22/2014 1111   ALT 18 04/16/2018 1033   ALT 17 05/22/2014 1111   BILITOT 1.0 04/16/2018 1033   BILITOT 0.8 05/22/2014 1111       RADIOGRAPHIC STUDIES: I have personally reviewed the radiological images as listed and agreed with the findings in the report. No results found.   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 Stable.   #Given stable disease continue Sandostatin 30 mg IM q. Monthly.    # Iron deficient anemia-question AV malformation.  STABLE.  continue p.o. iron.  #Left breast cancer stage I ER PR positive; on Arimidex.  No clinical evidence of recurrence.STABLE.   #Chronic right lower extremity swelling-dependent edema.  Recommend compression stockings./Continued chronic diuretics-see below.  Dopplers negative.  #Urinary incontinence-/  Discussed regarding referral to urology. patient wants to hold off.  # Educated the patient regarding novel coronavirus-modes of transmission/risks; and measures to avoid infection.    # DISPOSITION # Sandostatin today. # Sandostatin in 1 month # Follow up in 2 month- cbc/cmp/Sandstatin;  Dr.B    No orders of the defined types were placed in this encounter.  All questions were answered. The patient knows to call the clinic with any problems, questions or concerns.      [name], MD 04/16/2018 12:50 PM

## 2018-04-16 NOTE — Assessment & Plan Note (Addendum)
#   Non- functional Small bowel/mesenteric carcinoid; STAGE IV-on octreotide injections monthly' gallium PET scan January 2020-stable/no evidence of obvious progression. Stable.   #Given stable disease continue Sandostatin 30 mg IM q. Monthly.    # Iron deficient anemia-question AV malformation.  STABLE.  continue p.o. iron.  #Left breast cancer stage I ER PR positive; on Arimidex.  No clinical evidence of recurrence.STABLE.   #Chronic right lower extremity swelling-dependent edema.  Recommend compression stockings./Continued chronic diuretics-see below.  Dopplers negative.  #Urinary incontinence-/  Discussed regarding referral to urology. patient wants to hold off.  # Educated the patient regarding novel coronavirus-modes of transmission/risks; and measures to avoid infection.    # DISPOSITION # Sandostatin today. # Sandostatin in 1 month # Follow up in 2 month- cbc/cmp/Sandstatin;  Dr.B

## 2018-05-13 ENCOUNTER — Other Ambulatory Visit: Payer: Self-pay

## 2018-05-14 ENCOUNTER — Other Ambulatory Visit: Payer: Self-pay

## 2018-05-14 ENCOUNTER — Telehealth: Payer: Self-pay | Admitting: *Deleted

## 2018-05-14 ENCOUNTER — Inpatient Hospital Stay: Payer: Medicare Other | Attending: Internal Medicine

## 2018-05-14 DIAGNOSIS — C7A019 Malignant carcinoid tumor of the small intestine, unspecified portion: Secondary | ICD-10-CM | POA: Insufficient documentation

## 2018-05-14 DIAGNOSIS — K6389 Other specified diseases of intestine: Secondary | ICD-10-CM

## 2018-05-14 DIAGNOSIS — E340 Carcinoid syndrome: Secondary | ICD-10-CM | POA: Diagnosis not present

## 2018-05-14 DIAGNOSIS — R300 Dysuria: Secondary | ICD-10-CM

## 2018-05-14 MED ORDER — OCTREOTIDE ACETATE 30 MG IM KIT
30.0000 mg | PACK | Freq: Once | INTRAMUSCULAR | Status: AC
  Administered 2018-05-14: 30 mg via INTRAMUSCULAR

## 2018-05-14 NOTE — Telephone Encounter (Signed)
Patient's daughter contacted. No u/a was collected today while patient was in clinic. Daughter was informed. Apt provided for Monday 4/20 at 1245 for specimen collection.

## 2018-05-14 NOTE — Telephone Encounter (Signed)
Patients daughter called in to request UA be checked this morning when patient comes in for injection. Patient has been complaining of right kidney pain. Secure health message sent to Dr. Sharmaine Base team.

## 2018-05-17 ENCOUNTER — Other Ambulatory Visit: Payer: Medicare Other

## 2018-05-17 ENCOUNTER — Telehealth: Payer: Self-pay | Admitting: *Deleted

## 2018-05-17 NOTE — Telephone Encounter (Signed)
-----   Message from Theodore Demark, RN sent at 05/16/2018  2:38 PM EDT ----- Regarding: [place] appointment for labs Called to prescreen,  Daughter states patient wants to cancel appt. for u/a and culture.  Thank you!  Webb Silversmith

## 2018-06-09 ENCOUNTER — Telehealth: Payer: Self-pay | Admitting: *Deleted

## 2018-06-09 NOTE — Telephone Encounter (Signed)
Patients daughter, [name], called to make sure Dr Rogue Bussing and team knows some information for upcoming visit.  [name] says pt has been having abdominal pain, blood in stool and depression. Abdominal pain and blood was told to be from hemorrhoids per GI Dr Vira Agar. [name] also says that if patient is prescribed anything for depression she will not take it, [name] just wants doctor to be aware of this information.

## 2018-06-10 ENCOUNTER — Other Ambulatory Visit: Payer: Self-pay

## 2018-06-10 NOTE — Telephone Encounter (Signed)
MD made aware. He will see the patient in the clinic tomorrow and further evaluate this concern.

## 2018-06-11 ENCOUNTER — Inpatient Hospital Stay: Payer: Medicare Other | Attending: Internal Medicine | Admitting: Internal Medicine

## 2018-06-11 ENCOUNTER — Inpatient Hospital Stay: Payer: Medicare Other

## 2018-06-11 ENCOUNTER — Other Ambulatory Visit: Payer: Self-pay

## 2018-06-11 ENCOUNTER — Other Ambulatory Visit: Payer: Self-pay | Admitting: *Deleted

## 2018-06-11 ENCOUNTER — Inpatient Hospital Stay: Payer: Medicare Other | Attending: Internal Medicine

## 2018-06-11 DIAGNOSIS — M7989 Other specified soft tissue disorders: Secondary | ICD-10-CM | POA: Insufficient documentation

## 2018-06-11 DIAGNOSIS — R109 Unspecified abdominal pain: Secondary | ICD-10-CM | POA: Insufficient documentation

## 2018-06-11 DIAGNOSIS — D509 Iron deficiency anemia, unspecified: Secondary | ICD-10-CM | POA: Diagnosis not present

## 2018-06-11 DIAGNOSIS — Z79811 Long term (current) use of aromatase inhibitors: Secondary | ICD-10-CM | POA: Insufficient documentation

## 2018-06-11 DIAGNOSIS — Z79899 Other long term (current) drug therapy: Secondary | ICD-10-CM | POA: Insufficient documentation

## 2018-06-11 DIAGNOSIS — K921 Melena: Secondary | ICD-10-CM | POA: Diagnosis not present

## 2018-06-11 DIAGNOSIS — Z923 Personal history of irradiation: Secondary | ICD-10-CM | POA: Insufficient documentation

## 2018-06-11 DIAGNOSIS — Z86718 Personal history of other venous thrombosis and embolism: Secondary | ICD-10-CM | POA: Insufficient documentation

## 2018-06-11 DIAGNOSIS — D500 Iron deficiency anemia secondary to blood loss (chronic): Secondary | ICD-10-CM

## 2018-06-11 DIAGNOSIS — Z803 Family history of malignant neoplasm of breast: Secondary | ICD-10-CM | POA: Diagnosis not present

## 2018-06-11 DIAGNOSIS — Z882 Allergy status to sulfonamides status: Secondary | ICD-10-CM | POA: Insufficient documentation

## 2018-06-11 DIAGNOSIS — Z8042 Family history of malignant neoplasm of prostate: Secondary | ICD-10-CM | POA: Diagnosis not present

## 2018-06-11 DIAGNOSIS — Z170 Estrogen receptor positive status [ER+]: Secondary | ICD-10-CM | POA: Insufficient documentation

## 2018-06-11 DIAGNOSIS — Z800 Family history of malignant neoplasm of digestive organs: Secondary | ICD-10-CM | POA: Diagnosis not present

## 2018-06-11 DIAGNOSIS — C7A019 Malignant carcinoid tumor of the small intestine, unspecified portion: Secondary | ICD-10-CM | POA: Insufficient documentation

## 2018-06-11 DIAGNOSIS — R140 Abdominal distension (gaseous): Secondary | ICD-10-CM | POA: Diagnosis not present

## 2018-06-11 DIAGNOSIS — K6389 Other specified diseases of intestine: Secondary | ICD-10-CM

## 2018-06-11 DIAGNOSIS — R1084 Generalized abdominal pain: Secondary | ICD-10-CM

## 2018-06-11 DIAGNOSIS — C50812 Malignant neoplasm of overlapping sites of left female breast: Secondary | ICD-10-CM | POA: Insufficient documentation

## 2018-06-11 LAB — CBC WITH DIFFERENTIAL/PLATELET
Abs Immature Granulocytes: 0.01 10*3/uL (ref 0.00–0.07)
Basophils Absolute: 0.1 10*3/uL (ref 0.0–0.1)
Basophils Relative: 1 %
Eosinophils Absolute: 0.4 10*3/uL (ref 0.0–0.5)
Eosinophils Relative: 6 %
HCT: 35.6 % — ABNORMAL LOW (ref 36.0–46.0)
Hemoglobin: 11.9 g/dL — ABNORMAL LOW (ref 12.0–15.0)
Immature Granulocytes: 0 %
Lymphocytes Relative: 10 %
Lymphs Abs: 0.6 10*3/uL — ABNORMAL LOW (ref 0.7–4.0)
MCH: 29.5 pg (ref 26.0–34.0)
MCHC: 33.4 g/dL (ref 30.0–36.0)
MCV: 88.1 fL (ref 80.0–100.0)
Monocytes Absolute: 0.8 10*3/uL (ref 0.1–1.0)
Monocytes Relative: 13 %
Neutro Abs: 4.6 10*3/uL (ref 1.7–7.7)
Neutrophils Relative %: 70 %
Platelets: 187 10*3/uL (ref 150–400)
RBC: 4.04 MIL/uL (ref 3.87–5.11)
RDW: 13.9 % (ref 11.5–15.5)
WBC: 6.5 10*3/uL (ref 4.0–10.5)
nRBC: 0 % (ref 0.0–0.2)

## 2018-06-11 LAB — COMPREHENSIVE METABOLIC PANEL
ALT: 17 U/L (ref 0–44)
AST: 27 U/L (ref 15–41)
Albumin: 3.8 g/dL (ref 3.5–5.0)
Alkaline Phosphatase: 69 U/L (ref 38–126)
Anion gap: 9 (ref 5–15)
BUN: 18 mg/dL (ref 8–23)
CO2: 25 mmol/L (ref 22–32)
Calcium: 9.1 mg/dL (ref 8.9–10.3)
Chloride: 102 mmol/L (ref 98–111)
Creatinine, Ser: 0.88 mg/dL (ref 0.44–1.00)
GFR calc Af Amer: >60 mL/min (ref >60)
GFR calc non Af Amer: >60 mL/min (ref >60)
Glucose, Bld: 129 mg/dL — ABNORMAL HIGH (ref 70–99)
Potassium: 3.6 mmol/L (ref 3.5–5.1)
Sodium: 136 mmol/L (ref 135–145)
Total Bilirubin: 0.8 mg/dL (ref 0.3–1.2)
Total Protein: 6.7 g/dL (ref 6.5–8.1)

## 2018-06-11 MED ORDER — OCTREOTIDE ACETATE 30 MG IM KIT
30.0000 mg | PACK | Freq: Once | INTRAMUSCULAR | Status: AC
  Administered 2018-06-11: 30 mg via INTRAMUSCULAR
  Filled 2018-06-11: qty 1

## 2018-06-11 NOTE — Assessment & Plan Note (Addendum)
#   Non- functional Small bowel/mesenteric carcinoid; STAGE IV-on octreotide injections monthly' gallium PET scan January 2020-stable/no evidence of obvious progression.  Stable; however see discussion below [regarding abdominal pain/bloating]  # Given stable disease continue Sandostatin 30 mg IM q. Monthly.    # Abdominal pain/bloating/ blood in stools- Hb fairly stable at 11.9. [Aug 2019- colo- internal hemorrhoids]-clinically I do not suspect this to be from progressive malignancy; although she has involvement of her carcinoid involving the abdomen.  Question other causes like diverticulitis/colitis.  Would recommend CT of the abdomen pelvis ASAP.  # Iron deficient anemia-question AV malformation.  Overall stable hemoglobin 11.9.  Continue p.o. iron.  #Left breast cancer stage I ER PR positive; on Arimidex.  No clinical evidence of recurrence.  Stable  #Chronic right lower extremity swelling-dependent edema- stable; compression stockings.  Discussed with patient's daughter.  # DISPOSITION # Sandostatin today. # CT scan ASAP # Follow up in 5 weeks; MD cbc/cmp/chromogranini-A Sandstatin;  Dr.B

## 2018-06-11 NOTE — Progress Notes (Signed)
[place] OFFICE PROGRESS NOTE  Patient Care Team: Emma Pink, [name] Emma Graff, [name] Emma Cowman, [name] Emma Ramsay, [name]  Cancer Staging No matching staging information was found for the patient.   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   # FEB 2018- Ga PET- uptake in liver/ mesenteric mass; NOV 2019- increase sandostatin to 93m q monthly.   # Jan/FEB 2018- Liver MRI- "fat sparing" Bx- neg; no further wu recom  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] s/p Lumpec & RT; ER/PR > 90%; Her2 Neu-NEG; Arimidex; MAY 2016- Mammo-NEG; [until summer 2020]  # IDA s/p IV iron; last March 2014; [place] [Dr.Elliot; Jan 2016] colon angiotele- s/p Argon laser; April 2017- Ferrahem  # DVT [taken off eliquis DEC 2017]; NOV -DEC 2017 CHF/ UTI with sepsis- stone extracted [Dr.Brandon]  # Thyroid nodule- MNG s/p Bx [NOV 2016]/ right adnexal mass [stable since 2010]; hard of hearing  MOLECULAR TESTING- Not done  GENETIC TESTING- [place] ? 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  [name].  female pleasant patient above history of metastatic carcinoid tumor currently on Sandostatin and also history of early stage breast cancer ER PR positive on Arimidex  is here for follow-up.  Patient complains of intermittent abdominal pain for the last 1 week.  1-3 episodes a day.  At worst 8 on a scale of 10.  All over.  Improved with a bowel movement.  Blood in stools. [History of internal hemorrhoids.].  Currently symptoms are resolved.  Positive for bloating.  Chronic mild swelling in the legs but not any worse.  No new shortness of breath or cough.  No flushing.  Review of Systems  Constitutional: Positive for malaise/fatigue. Negative for chills, diaphoresis, fever and weight loss.  HENT: Negative for nosebleeds and sore throat.   Eyes: Negative for double vision.  Respiratory: Negative for hemoptysis, sputum production, shortness of breath and wheezing.   Cardiovascular: Negative for chest pain, palpitations and orthopnea.  Gastrointestinal: Positive for abdominal pain, blood in stool and diarrhea. Negative for constipation, heartburn, melena, nausea and vomiting.  Genitourinary: Negative for dysuria, frequency and urgency.  Musculoskeletal: Negative for back pain and joint pain.  Skin: Negative.  Negative for itching and rash.  Neurological: Negative for dizziness, tingling, focal weakness, weakness and headaches.  Endo/Heme/Allergies: Does not bruise/bleed easily.  Psychiatric/Behavioral: Negative for depression. The patient is not nervous/anxious and does not have insomnia.       PAST MEDICAL HISTORY :  Past Medical History:  Diagnosis Date  . Aromatase inhibitor use   . [place]   left breast cancer/radiation  . [place]  . CHF (congestive heart failure) (HCC)    recent sepsis  . Chronic kidney disease   . Dizziness   . Dyspnea  recently while admitted  . GERD (gastroesophageal reflux disease)   . Hearing loss   . History of hiatal hernia   . History of kidney stones   . HOH (hard of hearing)    severe  . Hypertension   . Hypothyroidism   . Leg DVT (deep venous  thromboembolism), acute ([place]) 07/2015   left leg after fracture  . 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 Pain    chest pain while admitted  . Personal history of radiation therapy 2015   left breast ca  . Thyroid disease     PAST SURGICAL HISTORY :   Past Surgical History:  Procedure Laterality Date  . ABDOMINAL HYSTERECTOMY     partial  . BREAST LUMPECTOMY Left 01/13/2013   invasive mammary carcinoma, clear margins, LN negative  . BREAST LUMPECTOMY WITH SENTINEL LYMPH NODE BIOPSY Left 2014  . CHOLECYSTECTOMY    . COLONOSCOPY WITH PROPOFOL N/A 09/17/2017   Procedure: COLONOSCOPY WITH PROPOFOL;  Surgeon: Jonathon Bellows, MD;  Location: [facility] ENDOSCOPY;  Service: Gastroenterology;  Laterality: N/A;  . CYSTOSCOPY W/ URETERAL STENT PLACEMENT Right 01/07/2016   Procedure: CYSTOSCOPY WITH STENT REPLACEMENT;  Surgeon: Hollice Espy, MD;  Location: [facility] ORS;  Service: Urology;  Laterality: Right;  . CYSTOSCOPY WITH RETROGRADE PYELOGRAM, URETEROSCOPY AND STENT PLACEMENT Right 12/19/2015   Procedure: CYSTOSCOPY WITH RETROGRADE PYELOGRAM, URETEROSCOPY AND STENT PLACEMENT;  Surgeon: Ardis Hughs, MD;  Location: [facility] ORS;  Service: Urology;  Laterality: Right;  . URETEROSCOPY WITH HOLMIUM LASER LITHOTRIPSY Right 01/07/2016   Procedure: URETEROSCOPY WITH HOLMIUM LASER LITHOTRIPSY;  Surgeon: Hollice Espy, MD;  Location: [facility] ORS;  Service: Urology;  Laterality: Right;    FAMILY HISTORY :   Family History  Problem Relation Age of Onset  . Breast cancer Mother 81       deceased 75  . Breast cancer Maternal Aunt 68       deceased 47s  . Prostate cancer Father        deceased 23  . Colon cancer Paternal Aunt 80       deceased 27s    SOCIAL HISTORY:   Social History   Tobacco Use  . Smoking status: Never Smoker  . Smokeless tobacco: Never Used  Substance Use Topics  . Alcohol use: No    Alcohol/week: 0.0 standard drinks  . Drug use: No    ALLERGIES:  is allergic to sulfa  antibiotics.  MEDICATIONS:  Current Outpatient Medications  Medication Sig Dispense Refill  . alendronate (FOSAMAX) 70 MG tablet Take 70 mg by mouth once a week.  3  . anastrozole (ARIMIDEX) 1 MG tablet TAKE 1 TABLET BY MOUTH EVERY DAY 90 tablet 5  . Calcium Carb-Cholecalciferol (CALCIUM 600 + D PO) Take 1 tablet by mouth daily.    . furosemide (LASIX) 40 MG tablet Take 40 mg by mouth daily as needed for fluid or edema.     Marland Kitchen KLOR-CON M10 10 MEQ tablet     . labetalol (NORMODYNE) 200 MG tablet Take 200 mg by mouth 2 (two) times daily.     Marland Kitchen levothyroxine (SYNTHROID) 88 MCG tablet Take 88 mcg by mouth daily before breakfast.     . losartan-hydrochlorothiazide (HYZAAR) 100-25 MG tablet Take 1 tablet by mouth daily.    Marland Kitchen nystatin (MYCOSTATIN/NYSTOP) powder APPLY TO AFFECTED AREA TWICE A DAY 15 g 3  . octreotide (SANDOSTATIN LAR) 30 MG injection Inject 30 mg into the muscle every 28 (twenty-eight) days.     . potassium chloride  SA (K-DUR,KLOR-CON) 20 MEQ tablet Take 2 tablets (40 mEq total) by mouth 2 (two) times daily. (Patient taking differently: Take 20 mEq by mouth daily as needed (when taking Lasix). ) 120 tablet 5  . Vitamin D, Ergocalciferol, (DRISDOL) 50000 units CAPS capsule TAKE 1 CAPSULE (50,000 UNITS TOTAL) BY MOUTH ONCE A WEEK. 24 capsule 1   No current facility-administered medications for this visit.     PHYSICAL EXAMINATION: ECOG PERFORMANCE STATUS: 1 - Symptomatic but completely ambulatory  BP 136/75   Pulse 65   Temp 97.6 F (36.4 C)   Resp 18   Wt 222 lb (100.7 kg)   BMI 39.33 kg/m   Filed Weights   06/11/18 1119  Weight: 222 lb (100.7 kg)    Physical Exam  Constitutional: She is oriented to person, place, and time and well-developed, well-nourished, and in no distress.  Patient is a wheelchair.  She is alone.  Patient is hard of hearing.  HENT:  Head: Normocephalic and atraumatic.  Mouth/Throat: Oropharynx is clear and moist. No oropharyngeal exudate.   Eyes: Pupils are equal, round, and reactive to light.  Neck: Normal range of motion. Neck supple.  Cardiovascular: Normal rate and regular rhythm.  Pulmonary/Chest: No respiratory distress. She has no wheezes.  Decreased breath sounds in the right upper chest.   Abdominal: Soft. Bowel sounds are normal. She exhibits no distension and no mass. There is no abdominal tenderness. There is no rebound and no guarding.  Musculoskeletal: Normal range of motion.        General: No tenderness.  Neurological: She is alert and oriented to person, place, and time.  Skin: Skin is warm.  Psychiatric: Affect normal.    LABORATORY DATA:  I have reviewed the data as listed    Component Value Date/Time   NA 136 06/11/2018 1035   NA 137 07/12/2013 0841   K 3.6 06/11/2018 1035   K 4.7 07/12/2013 0841   CL 102 06/11/2018 1035   CL 107 07/12/2013 0841   CO2 25 06/11/2018 1035   CO2 20 (L) 07/12/2013 0841   GLUCOSE 129 (H) 06/11/2018 1035   GLUCOSE 122 (H) 07/12/2013 0841   BUN 18 06/11/2018 1035   BUN 18 07/12/2013 0841   CREATININE 0.88 06/11/2018 1035   CREATININE 0.64 05/22/2014 1111   CALCIUM 9.1 06/11/2018 1035   CALCIUM 9.2 07/12/2013 0841   PROT 6.7 06/11/2018 1035   PROT 7.0 05/22/2014 1111   ALBUMIN 3.8 06/11/2018 1035   ALBUMIN 4.2 05/22/2014 1111   AST 27 06/11/2018 1035   AST 29 05/22/2014 1111   ALT 17 06/11/2018 1035   ALT 17 05/22/2014 1111   ALKPHOS 69 06/11/2018 1035   ALKPHOS 95 05/22/2014 1111   BILITOT 0.8 06/11/2018 1035   BILITOT 0.8 05/22/2014 1111   GFRNONAA >60 06/11/2018 1035   GFRNONAA >60 05/22/2014 1111   GFRAA >60 06/11/2018 1035   GFRAA >60 05/22/2014 1111    No results found for: SPEP, UPEP  Lab Results  Component Value Date   WBC 6.5 06/11/2018   NEUTROABS 4.6 06/11/2018   HGB 11.9 (L) 06/11/2018   HCT 35.6 (L) 06/11/2018   MCV 88.1 06/11/2018   PLT 187 06/11/2018      Chemistry      Component Value Date/Time   NA 136 06/11/2018 1035   NA  137 07/12/2013 0841   K 3.6 06/11/2018 1035   K 4.7 07/12/2013 0841   CL 102 06/11/2018 1035   CL 107  07/12/2013 0841   CO2 25 06/11/2018 1035   CO2 20 (L) 07/12/2013 0841   BUN 18 06/11/2018 1035   BUN 18 07/12/2013 0841   CREATININE 0.88 06/11/2018 1035   CREATININE 0.64 05/22/2014 1111      Component Value Date/Time   CALCIUM 9.1 06/11/2018 1035   CALCIUM 9.2 07/12/2013 0841   ALKPHOS 69 06/11/2018 1035   ALKPHOS 95 05/22/2014 1111   AST 27 06/11/2018 1035   AST 29 05/22/2014 1111   ALT 17 06/11/2018 1035   ALT 17 05/22/2014 1111   BILITOT 0.8 06/11/2018 1035   BILITOT 0.8 05/22/2014 1111       RADIOGRAPHIC STUDIES: I have personally reviewed the radiological images as listed and agreed with the findings in the report. No results found.   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  Stable; however see discussion below [regarding abdominal pain/bloating]  # Given stable disease continue Sandostatin 30 mg IM q. Monthly.    # Abdominal pain/bloating/ blood in stools- Hb fairly stable at 11.9. [Aug 2019- colo- internal hemorrhoids]-clinically I do not suspect this to be from progressive malignancy; although she has involvement of her carcinoid involving the abdomen.  Question other causes like diverticulitis/colitis.  Would recommend CT of the abdomen pelvis ASAP.  # Iron deficient anemia-question AV malformation.  Overall stable hemoglobin 11.9.  Continue p.o. iron.  #Left breast cancer stage I ER PR positive; on Arimidex.  No clinical evidence of recurrence.  Stable  #Chronic right lower extremity swelling-dependent edema- stable; compression stockings.  Discussed with patient's daughter.  # DISPOSITION # Sandostatin today. # CT scan ASAP # Follow up  in 5 weeks; MD cbc/cmp/chromogranini-A Sandstatin;  Dr.B    Orders Placed This Encounter  Procedures  . CT Abdomen Pelvis W Contrast    Standing Status:   Future    Standing Expiration Date:   06/11/2019    Order Specific Question:   ** REASON FOR EXAM (FREE TEXT)    Answer:   abdominal pain; carcinoid    Order Specific Question:   If indicated for the ordered procedure, I authorize the administration of contrast media per Radiology protocol    Answer:   Yes    Order Specific Question:   Preferred imaging location?    Answer:   [facility]    Order Specific Question:   Is Oral Contrast requested for this exam?    Answer:   Yes, Per Radiology protocol    Order Specific Question:   Radiology Contrast Protocol - do NOT remove file path    Answer:   [email or web address]   All questions were answered. The patient knows to call the clinic with any problems, questions or concerns.      [name], MD 06/11/2018 1:55 PM

## 2018-06-11 NOTE — Progress Notes (Signed)
Patient reports having good and bad days.  On a bad day she will have abdominal pain and bloating that will improve after having a bloody bowel movement.  She had 1 episode of passing blood on  Monday and 3 episodes on Wednesday of this week.

## 2018-06-14 ENCOUNTER — Ambulatory Visit
Admission: RE | Admit: 2018-06-14 | Discharge: 2018-06-14 | Disposition: A | Discharge status: Home / Self Care | Payer: Medicare Other | Source: Ambulatory Visit | Attending: Internal Medicine | Admitting: Internal Medicine

## 2018-06-14 ENCOUNTER — Other Ambulatory Visit: Payer: Self-pay

## 2018-06-14 DIAGNOSIS — R1084 Generalized abdominal pain: Secondary | ICD-10-CM | POA: Diagnosis not present

## 2018-06-14 MED ORDER — IOHEXOL 300 MG/ML  SOLN
100.0000 mL | Freq: Once | INTRAMUSCULAR | Status: AC | PRN
  Administered 2018-06-14: 100 mL via INTRAVENOUS

## 2018-06-15 ENCOUNTER — Telehealth: Payer: Self-pay | Admitting: Internal Medicine

## 2018-06-15 NOTE — Telephone Encounter (Signed)
Spoke to patient's daughter regarding the results of the CT scan-overall malignancy stable; however noted to have thickened small bowel-likely contributing to patient's symptoms [abdominal discomfort; bloating]; patient's chronic diarrhea/likely hemorrhoidal bleeding.  Prefer Dr. Vira Agar for follow-up.  I have sent a message for Dr. Vira Agar.  I have also asked patient family to call Dr. Percell [place] office for further recommendations on the CT scan results.

## 2018-07-08 ENCOUNTER — Other Ambulatory Visit: Payer: Self-pay | Admitting: *Deleted

## 2018-07-08 DIAGNOSIS — C7A019 Malignant carcinoid tumor of the small intestine, unspecified portion: Secondary | ICD-10-CM

## 2018-07-09 ENCOUNTER — Other Ambulatory Visit: Payer: Self-pay

## 2018-07-09 ENCOUNTER — Encounter: Payer: Self-pay | Admitting: Internal Medicine

## 2018-07-09 ENCOUNTER — Inpatient Hospital Stay (HOSPITAL_BASED_OUTPATIENT_CLINIC_OR_DEPARTMENT_OTHER): Payer: Medicare Other | Admitting: Internal Medicine

## 2018-07-09 ENCOUNTER — Inpatient Hospital Stay: Payer: Medicare Other

## 2018-07-09 ENCOUNTER — Inpatient Hospital Stay: Payer: Medicare Other | Attending: Internal Medicine

## 2018-07-09 VITALS — BP 131/75 | HR 58 | Temp 97.0°F | Resp 18 | Ht 63.0 in | Wt 220.4 lb

## 2018-07-09 DIAGNOSIS — C50912 Malignant neoplasm of unspecified site of left female breast: Secondary | ICD-10-CM | POA: Insufficient documentation

## 2018-07-09 DIAGNOSIS — C7A019 Malignant carcinoid tumor of the small intestine, unspecified portion: Secondary | ICD-10-CM

## 2018-07-09 DIAGNOSIS — E340 Carcinoid syndrome: Secondary | ICD-10-CM

## 2018-07-09 DIAGNOSIS — Z853 Personal history of malignant neoplasm of breast: Secondary | ICD-10-CM

## 2018-07-09 DIAGNOSIS — Z79811 Long term (current) use of aromatase inhibitors: Secondary | ICD-10-CM | POA: Insufficient documentation

## 2018-07-09 DIAGNOSIS — Z86718 Personal history of other venous thrombosis and embolism: Secondary | ICD-10-CM | POA: Diagnosis not present

## 2018-07-09 DIAGNOSIS — D509 Iron deficiency anemia, unspecified: Secondary | ICD-10-CM | POA: Diagnosis not present

## 2018-07-09 DIAGNOSIS — C7B02 Secondary carcinoid tumors of liver: Secondary | ICD-10-CM | POA: Diagnosis not present

## 2018-07-09 DIAGNOSIS — Z170 Estrogen receptor positive status [ER+]: Secondary | ICD-10-CM | POA: Insufficient documentation

## 2018-07-09 DIAGNOSIS — K6389 Other specified diseases of intestine: Secondary | ICD-10-CM

## 2018-07-09 LAB — COMPREHENSIVE METABOLIC PANEL
ALT: 16 U/L (ref 0–44)
AST: 23 U/L (ref 15–41)
Albumin: 3.8 g/dL (ref 3.5–5.0)
Alkaline Phosphatase: 73 U/L (ref 38–126)
Anion gap: 10 (ref 5–15)
BUN: 16 mg/dL (ref 8–23)
CO2: 26 mmol/L (ref 22–32)
Calcium: 9.2 mg/dL (ref 8.9–10.3)
Chloride: 102 mmol/L (ref 98–111)
Creatinine, Ser: 0.87 mg/dL (ref 0.44–1.00)
GFR calc Af Amer: >60 mL/min (ref >60)
GFR calc non Af Amer: >60 mL/min (ref >60)
Glucose, Bld: 121 mg/dL — ABNORMAL HIGH (ref 70–99)
Potassium: 3.7 mmol/L (ref 3.5–5.1)
Sodium: 138 mmol/L (ref 135–145)
Total Bilirubin: 0.7 mg/dL (ref 0.3–1.2)
Total Protein: 6.8 g/dL (ref 6.5–8.1)

## 2018-07-09 LAB — CBC WITH DIFFERENTIAL/PLATELET
Abs Immature Granulocytes: 0.02 10*3/uL (ref 0.00–0.07)
Basophils Absolute: 0.1 10*3/uL (ref 0.0–0.1)
Basophils Relative: 1 %
Eosinophils Absolute: 0.3 10*3/uL (ref 0.0–0.5)
Eosinophils Relative: 5 %
HCT: 36.7 % (ref 36.0–46.0)
Hemoglobin: 11.9 g/dL — ABNORMAL LOW (ref 12.0–15.0)
Immature Granulocytes: 0 %
Lymphocytes Relative: 10 %
Lymphs Abs: 0.7 10*3/uL (ref 0.7–4.0)
MCH: 28.5 pg (ref 26.0–34.0)
MCHC: 32.4 g/dL (ref 30.0–36.0)
MCV: 87.8 fL (ref 80.0–100.0)
Monocytes Absolute: 0.8 10*3/uL (ref 0.1–1.0)
Monocytes Relative: 13 %
Neutro Abs: 4.4 10*3/uL (ref 1.7–7.7)
Neutrophils Relative %: 71 %
Platelets: 196 10*3/uL (ref 150–400)
RBC: 4.18 MIL/uL (ref 3.87–5.11)
RDW: 14 % (ref 11.5–15.5)
WBC: 6.3 10*3/uL (ref 4.0–10.5)
nRBC: 0 % (ref 0.0–0.2)

## 2018-07-09 MED ORDER — OCTREOTIDE ACETATE 30 MG IM KIT
30.0000 mg | PACK | Freq: Once | INTRAMUSCULAR | Status: AC
  Administered 2018-07-09: 12:00:00 30 mg via INTRAMUSCULAR
  Filled 2018-07-09: qty 1

## 2018-07-09 NOTE — Progress Notes (Signed)
No new changes noted today 

## 2018-07-09 NOTE — Assessment & Plan Note (Addendum)
#   Non- functional Small bowel/mesenteric carcinoid; STAGE IV-on octreotide injections monthly' gallium PET scan January /June 2020 CT-stable/no evidence of obvious progression.  Stable  #Continue Sandostatin on a monthly basis.  # Abdominal pain/bloating/ blood in stools- Hb fairly stable at 11.9.  June 2020 CT scan showed small bowel thickening-secondary to underlying carcinoid; otherwise no acute process.  Also reviewed with the patient's daughter.  # Iron deficient anemia-question AV malformation.  Hemoglobin 11.9 stable.  #Left breast cancer stage I ER PR positive; on Arimidex.  No clinical evidence of recurrence.  Stop at next visit.   #Chronic right lower extremity swelling-dependent edema-stable  # DISPOSITION # Sandostatin today. # bil screening mammogram in 4 weeks-  # Follow up in 4 weeks; MD cbc/cmp/chromogranini-A Sandstatin;  Dr.B

## 2018-07-09 NOTE — Progress Notes (Signed)
[facility] OFFICE PROGRESS NOTE  [name], [name] [name], [name] [name], [name] [name], [name]  Cancer Staging No matching staging information was found for the patient.   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   # FEB 2018- Ga PET- uptake in liver/ mesenteric mass; NOV 2019- increase sandostatin to 68m q monthly.   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  female pleasant patient above history of metastatic carcinoid tumor currently on Sandostatin and also history of early stage breast cancer ER PR positive on Arimidex is here for follow-up.  At the last visit patient had a CT  scan for abdominal pain showed no acute process.  She is also evaluated by GI.  Current abdominal pain is resolved.  Patient denies any blood in stools or black or stools.  She has chronic mild diarrhea.  Not any worse.  She has chronic mild swelling in the legs.  Not any worse.  No new shortness of breath or cough.  Review of Systems  Constitutional: Positive for malaise/fatigue. Negative for chills, diaphoresis, fever and weight loss.  HENT: Negative for nosebleeds and sore throat.   Eyes: Negative for double vision.  Respiratory: Negative for hemoptysis, sputum production, shortness of breath and wheezing.   Cardiovascular: Negative for chest pain, palpitations and orthopnea.  Gastrointestinal: Positive for abdominal pain, blood in stool and diarrhea. Negative for constipation, heartburn, melena, nausea and vomiting.  Genitourinary: Negative for dysuria, frequency and urgency.  Musculoskeletal: Negative for back pain and joint pain.  Skin: Negative.  Negative for itching and rash.  Neurological: Negative for dizziness, tingling, focal weakness, weakness and headaches.  Endo/Heme/Allergies: Does not bruise/bleed easily.  Psychiatric/Behavioral: Negative for depression. The patient is not nervous/anxious and does not have insomnia.       PAST MEDICAL HISTORY :  Past Medical History:  Diagnosis Date  . Aromatase inhibitor use   . Breast cancer ([facility] 2014   left breast cancer/radiation  . Breast cancer, [place] breast ([facility] 06/19/2014  . CHF (congestive heart failure) (HCC)    recent sepsis  . Chronic kidney disease   . Dizziness   . Dyspnea    recently while admitted  . GERD (gastroesophageal reflux disease)   . Hearing  loss   . History of hiatal hernia   . History of kidney stones   . HOH (hard of hearing)    severe  . Hypertension   . Hypothyroidism   . Leg DVT (deep venous thromboembolism), acute ([place]) 07/2015   left leg after fracture  . 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 Pain     chest pain while admitted  . Personal history of radiation therapy 2015   left breast ca  . Thyroid disease     PAST SURGICAL HISTORY :   Past Surgical History:  Procedure Laterality Date  . ABDOMINAL HYSTERECTOMY     partial  . BREAST LUMPECTOMY Left 01/13/2013   invasive mammary carcinoma, clear margins, LN negative  . BREAST LUMPECTOMY WITH SENTINEL LYMPH NODE BIOPSY Left 2014  . CHOLECYSTECTOMY    . COLONOSCOPY WITH PROPOFOL N/A 09/17/2017   Procedure: COLONOSCOPY WITH PROPOFOL;  Surgeon: Jonathon Bellows, MD;  Location: [facility] ENDOSCOPY;  Service: Gastroenterology;  Laterality: N/A;  . CYSTOSCOPY W/ URETERAL STENT PLACEMENT Right 01/07/2016   Procedure: CYSTOSCOPY WITH STENT REPLACEMENT;  Surgeon: Hollice Espy, MD;  Location: [facility] ORS;  Service: Urology;  Laterality: Right;  . CYSTOSCOPY WITH RETROGRADE PYELOGRAM, URETEROSCOPY AND STENT PLACEMENT Right 12/19/2015   Procedure: CYSTOSCOPY WITH RETROGRADE PYELOGRAM, URETEROSCOPY AND STENT PLACEMENT;  Surgeon: Ardis Hughs, MD;  Location: [facility] ORS;  Service: Urology;  Laterality: Right;  . URETEROSCOPY WITH HOLMIUM LASER LITHOTRIPSY Right 01/07/2016   Procedure: URETEROSCOPY WITH HOLMIUM LASER LITHOTRIPSY;  Surgeon: Hollice Espy, MD;  Location: [facility] ORS;  Service: Urology;  Laterality: Right;    FAMILY HISTORY :   Family History  Problem Relation Age of Onset  . Breast cancer Mother 33       deceased 75  . Breast cancer Maternal Aunt 68       deceased 85s  . Prostate cancer Father        deceased 53  . Colon cancer Paternal Aunt 80       deceased 50s    SOCIAL HISTORY:   Social History   Tobacco Use  . Smoking status: Never Smoker  . Smokeless tobacco: Never Used  Substance Use Topics  . Alcohol use: No    Alcohol/week: 0.0 standard drinks  . Drug use: No    ALLERGIES:  is allergic to sulfa antibiotics.  MEDICATIONS:  Current Outpatient Medications  Medication Sig Dispense Refill  . alendronate (FOSAMAX)  70 MG tablet Take 70 mg by mouth once a week.  3  . anastrozole (ARIMIDEX) 1 MG tablet TAKE 1 TABLET BY MOUTH EVERY DAY 90 tablet 5  . Calcium Carb-Cholecalciferol (CALCIUM 600 + D PO) Take 1 tablet by mouth daily.    . furosemide (LASIX) 40 MG tablet Take 40 mg by mouth daily as needed for fluid or edema.     Marland Kitchen KLOR-CON M10 10 MEQ tablet     . labetalol (NORMODYNE) 200 MG tablet Take 200 mg by mouth 2 (two) times daily.     Marland Kitchen losartan-hydrochlorothiazide (HYZAAR) 100-25 MG tablet Take 1 tablet by mouth daily.    Marland Kitchen nystatin (MYCOSTATIN/NYSTOP) powder APPLY TO AFFECTED AREA TWICE A DAY 15 g 3  . octreotide (SANDOSTATIN LAR) 30 MG injection Inject 30 mg into the muscle every 28 (twenty-eight) days.     . potassium chloride SA (K-DUR,KLOR-CON) 20 MEQ tablet Take 2 tablets (40 mEq total) by mouth 2 (two) times daily. (Patient taking differently: Take 20 mEq by mouth daily as needed (when taking Lasix). )  120 tablet 5  . Vitamin D, Ergocalciferol, (DRISDOL) 50000 units CAPS capsule TAKE 1 CAPSULE (50,000 UNITS TOTAL) BY MOUTH ONCE A WEEK. 24 capsule 1  . levothyroxine (SYNTHROID) 88 MCG tablet Take 88 mcg by mouth daily before breakfast.      No current facility-administered medications for this visit.     PHYSICAL EXAMINATION: ECOG PERFORMANCE STATUS: 1 - Symptomatic but completely ambulatory  BP 131/75 (BP Location: Right Arm, Patient Position: Sitting)   Pulse (!) 58   Temp (!) 97 F (36.1 C)   Resp 18   Ht _0  (1.6 m)   Wt 220 lb 6.4 oz (100 kg)   SpO2 94%   BMI 39.04 kg/m   Filed Weights   07/09/18 1053  Weight: 220 lb 6.4 oz (100 kg)    Physical Exam  Constitutional: She is oriented to person, place, and time and well-developed, well-nourished, and in no distress.  Patient is a wheelchair.  She is alone.  Patient is hard of hearing.  HENT:  Head: Normocephalic and atraumatic.  Mouth/Throat: Oropharynx is clear and moist. No oropharyngeal exudate.  Eyes: Pupils are equal,  round, and reactive to light.  Neck: Normal range of motion. Neck supple.  Cardiovascular: Normal rate and regular rhythm.  Pulmonary/Chest: No respiratory distress. She has no wheezes.  Decreased breath sounds in the right upper chest.   Abdominal: Soft. Bowel sounds are normal. She exhibits no distension and no mass. There is no abdominal tenderness. There is no rebound and no guarding.  Musculoskeletal: Normal range of motion.        General: No tenderness.  Neurological: She is alert and oriented to person, place, and time.  Skin: Skin is warm.  Psychiatric: Affect normal.    LABORATORY DATA:  I have reviewed the data as listed    Component Value Date/Time   NA 138 07/09/2018 1027   NA 137 07/12/2013 0841   K 3.7 07/09/2018 1027   K 4.7 07/12/2013 0841   CL 102 07/09/2018 1027   CL 107 07/12/2013 0841   CO2 26 07/09/2018 1027   CO2 20 (L) 07/12/2013 0841   GLUCOSE 121 (H) 07/09/2018 1027   GLUCOSE 122 (H) 07/12/2013 0841   BUN 16 07/09/2018 1027   BUN 18 07/12/2013 0841   CREATININE 0.87 07/09/2018 1027   CREATININE 0.64 05/22/2014 1111   CALCIUM 9.2 07/09/2018 1027   CALCIUM 9.2 07/12/2013 0841   PROT 6.8 07/09/2018 1027   PROT 7.0 05/22/2014 1111   ALBUMIN 3.8 07/09/2018 1027   ALBUMIN 4.2 05/22/2014 1111   AST 23 07/09/2018 1027   AST 29 05/22/2014 1111   ALT 16 07/09/2018 1027   ALT 17 05/22/2014 1111   ALKPHOS 73 07/09/2018 1027   ALKPHOS 95 05/22/2014 1111   BILITOT 0.7 07/09/2018 1027   BILITOT 0.8 05/22/2014 1111   GFRNONAA >60 07/09/2018 1027   GFRNONAA >60 05/22/2014 1111   GFRAA >60 07/09/2018 1027   GFRAA >60 05/22/2014 1111    No results found for: SPEP, UPEP  Lab Results  Component Value Date   WBC 6.3 07/09/2018   NEUTROABS 4.4 07/09/2018   HGB 11.9 (L) 07/09/2018   HCT 36.7 07/09/2018   MCV 87.8 07/09/2018   PLT 196 07/09/2018      Chemistry      Component Value Date/Time   NA 138 07/09/2018 1027   NA 137 07/12/2013 0841   K 3.7  07/09/2018 1027   K 4.7 07/12/2013 0841  CL 102 07/09/2018 1027   CL 107 07/12/2013 0841   CO2 26 07/09/2018 1027   CO2 20 (L) 07/12/2013 0841   BUN 16 07/09/2018 1027   BUN 18 07/12/2013 0841   CREATININE 0.87 07/09/2018 1027   CREATININE 0.64 05/22/2014 1111      Component Value Date/Time   CALCIUM 9.2 07/09/2018 1027   CALCIUM 9.2 07/12/2013 0841   ALKPHOS 73 07/09/2018 1027   ALKPHOS 95 05/22/2014 1111   AST 23 07/09/2018 1027   AST 29 05/22/2014 1111   ALT 16 07/09/2018 1027   ALT 17 05/22/2014 1111   BILITOT 0.7 07/09/2018 1027   BILITOT 0.8 05/22/2014 1111       RADIOGRAPHIC STUDIES: I have personally reviewed the radiological images as listed and agreed with the findings in the report. No results found.   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  Stable  #Continue Sandostatin on a monthly basis.  # Abdominal pain/bloating/ blood in stools- Hb fairly stable at 11.9.  June 2020 CT scan showed small bowel thickening-secondary to underlying carcinoid; otherwise no acute process.  Also reviewed with the patient's daughter.  # Iron deficient anemia-question AV malformation.  Hemoglobin 11.9 stable.  #Left breast cancer stage I ER PR positive; on Arimidex.  No clinical evidence of recurrence.  Stop at next visit.   #Chronic right lower extremity swelling-dependent edema-stable  # DISPOSITION # Sandostatin today. # bil screening mammogram in 4 weeks-  # Follow up in 4 weeks; MD cbc/cmp/chromogranini-A Sandstatin;  Dr.B    Orders Placed This Encounter  Procedures  . MM 3D SCREEN BREAST BILATERAL    Standing Status:   Future    Standing Expiration Date:   09/08/2019    Order Specific Question:   Reason for Exam (SYMPTOM  OR DIAGNOSIS  REQUIRED)    Answer:   history of breast cancer    Order Specific Question:   Preferred imaging location?    Answer:   [facility]  . CBC with Differential    Standing Status:   Future    Standing Expiration Date:   07/09/2019  . Comprehensive metabolic panel    Standing Status:   Future    Standing Expiration Date:   07/09/2019  . Chromogranin A    Standing Status:   Future    Standing Expiration Date:   07/09/2019   All questions were answered. The patient knows to call the clinic with any problems, questions or concerns.      [name], MD 07/10/2018 6:06 AM

## 2018-07-12 ENCOUNTER — Telehealth: Payer: Self-pay | Admitting: Internal Medicine

## 2018-07-12 NOTE — Telephone Encounter (Signed)
Left a message on patient's mobile number-regarding her daughter's concern for injection site pain and erythema.  Heather- please call the daughter; and address her concerns.

## 2018-07-13 ENCOUNTER — Telehealth: Payer: Self-pay | Admitting: *Deleted

## 2018-07-13 NOTE — Telephone Encounter (Signed)
Site hard and there is mild heat at the site. Area -  errythema at the injection site has not changed.  No fevers. Daughter instructed to apply an ice pack to the site to see if this would improve the patient's discomfort for 15 min. Increments. Teach back process performed with patient's daughter. Family will call our office back if symptoms at inj. Site worsen and do not improve.

## 2018-07-13 NOTE — Telephone Encounter (Signed)
Md spoke with patient's daughter, ice packs motrin or aleve and hydrocortisone cream encouraged. Per daughter, pt has refused ice packs and any intervention.  Per dr. b- md instructed daughter to call clinic back in approx. 24 hrs to reasscess this concern.

## 2018-07-13 NOTE — Telephone Encounter (Signed)
Daughter called back stating she has photos she wants to send US of the injection site from Friday which is hard and very painful to even slight touch. She states she cannot get her MyChart to work right today so she wants to email them to [place]. I asked if she would rather bring patient to [facility] for evaluation and she stated that the patient does not want to move or come in. I gave her my Email address to send photos to and advised that physician may want her to come in after seeing them.

## 2018-07-13 NOTE — Telephone Encounter (Signed)
Thank you, Emma [name]! GB

## 2018-07-14 LAB — CHROMOGRANIN A REBASELINE
Chromogranin A (ng/mL): 148 ng/mL — ABNORMAL HIGH (ref 0.0–101.8)
Chromogranin A: 4 nmol/L (ref 0–5)

## 2018-07-15 ENCOUNTER — Telehealth: Payer: Self-pay | Admitting: *Deleted

## 2018-07-15 NOTE — Telephone Encounter (Signed)
I called to follow up on patient injection site problem and got voice mail I left message to return my call

## 2018-08-05 ENCOUNTER — Other Ambulatory Visit: Payer: Self-pay

## 2018-08-06 ENCOUNTER — Telehealth: Payer: Self-pay | Admitting: Pharmacy Technician

## 2018-08-06 ENCOUNTER — Telehealth: Payer: Self-pay | Admitting: Pharmacist

## 2018-08-06 ENCOUNTER — Inpatient Hospital Stay: Payer: Medicare Other | Attending: Internal Medicine

## 2018-08-06 ENCOUNTER — Inpatient Hospital Stay: Payer: Medicare Other

## 2018-08-06 ENCOUNTER — Inpatient Hospital Stay (HOSPITAL_BASED_OUTPATIENT_CLINIC_OR_DEPARTMENT_OTHER): Payer: Medicare Other | Admitting: Internal Medicine

## 2018-08-06 ENCOUNTER — Other Ambulatory Visit: Payer: Self-pay

## 2018-08-06 DIAGNOSIS — E340 Carcinoid syndrome: Secondary | ICD-10-CM | POA: Diagnosis not present

## 2018-08-06 DIAGNOSIS — C7A019 Malignant carcinoid tumor of the small intestine, unspecified portion: Secondary | ICD-10-CM | POA: Insufficient documentation

## 2018-08-06 DIAGNOSIS — I509 Heart failure, unspecified: Secondary | ICD-10-CM | POA: Insufficient documentation

## 2018-08-06 DIAGNOSIS — C7B02 Secondary carcinoid tumors of liver: Secondary | ICD-10-CM

## 2018-08-06 DIAGNOSIS — I130 Hypertensive heart and chronic kidney disease with heart failure and stage 1 through stage 4 chronic kidney disease, or unspecified chronic kidney disease: Secondary | ICD-10-CM | POA: Diagnosis not present

## 2018-08-06 DIAGNOSIS — E039 Hypothyroidism, unspecified: Secondary | ICD-10-CM

## 2018-08-06 DIAGNOSIS — K6389 Other specified diseases of intestine: Secondary | ICD-10-CM

## 2018-08-06 DIAGNOSIS — N189 Chronic kidney disease, unspecified: Secondary | ICD-10-CM | POA: Insufficient documentation

## 2018-08-06 DIAGNOSIS — Z853 Personal history of malignant neoplasm of breast: Secondary | ICD-10-CM

## 2018-08-06 DIAGNOSIS — D509 Iron deficiency anemia, unspecified: Secondary | ICD-10-CM | POA: Insufficient documentation

## 2018-08-06 DIAGNOSIS — G509 Disorder of trigeminal nerve, unspecified: Secondary | ICD-10-CM

## 2018-08-06 LAB — CBC WITH DIFFERENTIAL/PLATELET
Abs Immature Granulocytes: 0.01 10*3/uL (ref 0.00–0.07)
Basophils Absolute: 0.1 10*3/uL (ref 0.0–0.1)
Basophils Relative: 1 %
Eosinophils Absolute: 0.3 10*3/uL (ref 0.0–0.5)
Eosinophils Relative: 5 %
HCT: 36 % (ref 36.0–46.0)
Hemoglobin: 11.8 g/dL — ABNORMAL LOW (ref 12.0–15.0)
Immature Granulocytes: 0 %
Lymphocytes Relative: 10 %
Lymphs Abs: 0.6 10*3/uL — ABNORMAL LOW (ref 0.7–4.0)
MCH: 28.9 pg (ref 26.0–34.0)
MCHC: 32.8 g/dL (ref 30.0–36.0)
MCV: 88 fL (ref 80.0–100.0)
Monocytes Absolute: 0.9 10*3/uL (ref 0.1–1.0)
Monocytes Relative: 14 %
Neutro Abs: 4.3 10*3/uL (ref 1.7–7.7)
Neutrophils Relative %: 70 %
Platelets: 191 10*3/uL (ref 150–400)
RBC: 4.09 MIL/uL (ref 3.87–5.11)
RDW: 14.3 % (ref 11.5–15.5)
WBC: 6.1 10*3/uL (ref 4.0–10.5)
nRBC: 0 % (ref 0.0–0.2)

## 2018-08-06 LAB — COMPREHENSIVE METABOLIC PANEL
ALT: 16 U/L (ref 0–44)
AST: 24 U/L (ref 15–41)
Albumin: 3.9 g/dL (ref 3.5–5.0)
Alkaline Phosphatase: 73 U/L (ref 38–126)
Anion gap: 12 (ref 5–15)
BUN: 14 mg/dL (ref 8–23)
CO2: 26 mmol/L (ref 22–32)
Calcium: 9.4 mg/dL (ref 8.9–10.3)
Chloride: 99 mmol/L (ref 98–111)
Creatinine, Ser: 0.76 mg/dL (ref 0.44–1.00)
GFR calc Af Amer: >60 mL/min (ref >60)
GFR calc non Af Amer: >60 mL/min (ref >60)
Glucose, Bld: 132 mg/dL — ABNORMAL HIGH (ref 70–99)
Potassium: 3.4 mmol/L — ABNORMAL LOW (ref 3.5–5.1)
Sodium: 137 mmol/L (ref 135–145)
Total Bilirubin: 1 mg/dL (ref 0.3–1.2)
Total Protein: 7 g/dL (ref 6.5–8.1)

## 2018-08-06 MED ORDER — XERMELO 250 MG PO TABS: 250.0000 mg | ORAL_TABLET | Freq: Two times a day (BID) | ORAL | 4 refills | Status: DC

## 2018-08-06 MED ORDER — OCTREOTIDE ACETATE 30 MG IM KIT
30.0000 mg | PACK | Freq: Once | INTRAMUSCULAR | Status: AC
  Administered 2018-08-06: 11:00:00 30 mg via INTRAMUSCULAR
  Filled 2018-08-06: qty 1

## 2018-08-06 NOTE — Telephone Encounter (Signed)
Oral Oncology Patient Advocate Encounter  Received notification from [facility] that prior authorization for Emma [name] is required.  PA submitted on CoverMyMeds Key [id number] Status is pending  Oral Oncology Clinic will continue to follow.  [place] Patient [place] Phone [phone] Fax [phone] 08/06/2018 12:52 PM

## 2018-08-06 NOTE — Telephone Encounter (Signed)
Oral Chemotherapy Pharmacist Encounter   Met with Ms. Ambroise following her office visit to discuss Xermelo. I walked her through the process of obtaining the medication, how to take the medication, and medication side effects. She stated her understand and I answered all her questions. I provider her with my contact information to call if she had an additional questions.  Darl Pikes, PharmD, BCPS, [facility] Hematology/Oncology Clinical Pharmacist [facility] [phone]  08/06/2018 4:02 PM

## 2018-08-06 NOTE — Progress Notes (Signed)
[place] OFFICE PROGRESS NOTE  [name], [name] [name], [name] [name], [name] [name], [name]  Cancer Staging No matching staging information was found for the patient.   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   # FEB 2018- Ga PET- uptake in liver/ mesenteric mass; NOV 2019- increase sandostatin to 49m q monthly.   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  female pleasant patient above history of metastatic carcinoid tumor currently on Sandostatin and also history of early stage breast cancer ER PR positive on Arimidex is here for follow-up.  Patient denies any abdominal pain  nausea vomiting.  She is chronic diarrhea.  Intermittent flushing-not any worse.  She has chronic mild swelling in the legs.  Not any worse.  No new shortness of breath or cough.  Review of Systems  Constitutional: Positive for malaise/fatigue. Negative for chills, diaphoresis, fever and weight loss.  HENT: Negative for nosebleeds and sore throat.   Eyes: Negative for double vision.  Respiratory: Negative for hemoptysis, sputum production, shortness of breath and wheezing.   Cardiovascular: Negative for chest pain, palpitations and orthopnea.  Gastrointestinal: Positive for diarrhea. Negative for constipation, heartburn, melena, nausea and vomiting.  Genitourinary: Negative for dysuria, frequency and urgency.  Musculoskeletal: Negative for back pain and joint pain.  Skin: Negative.  Negative for itching and rash.  Neurological: Negative for dizziness, tingling, focal weakness, weakness and headaches.  Endo/Heme/Allergies: Does not bruise/bleed easily.  Psychiatric/Behavioral: Negative for depression. The patient is not nervous/anxious and does not have insomnia.       PAST MEDICAL HISTORY :  Past Medical History:  Diagnosis Date  . Aromatase inhibitor use   . Breast cancer [place] [place]   left breast cancer/radiation  . [place] breast [place] [place]  . CHF (congestive heart failure) (HCC)    recent sepsis  . Chronic kidney disease   . Dizziness   . Dyspnea    recently while admitted  . GERD (gastroesophageal reflux disease)   . Hearing loss   . History of hiatal hernia   . History of kidney stones   . HOH (hard of hearing)    severe  . Hypertension   . Hypothyroidism   . Leg DVT (deep  venous thromboembolism), acute ([place]) 07/2015   left leg after fracture  . 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 Pain    chest pain while admitted  . Personal history of radiation therapy 2015   left breast ca  . Thyroid disease     PAST SURGICAL HISTORY :   Past Surgical History:   Procedure Laterality Date  . ABDOMINAL HYSTERECTOMY     partial  . BREAST LUMPECTOMY Left 01/13/2013   invasive mammary carcinoma, clear margins, LN negative  . BREAST LUMPECTOMY WITH SENTINEL LYMPH NODE BIOPSY Left 2014  . CHOLECYSTECTOMY    . COLONOSCOPY WITH PROPOFOL N/A 09/17/2017   Procedure: COLONOSCOPY WITH PROPOFOL;  Surgeon: Jonathon Bellows, MD;  Location[facility] ENDOSCOPY;  Service: Gastroenterology;  Laterality: N/A;  . CYSTOSCOPY W/ URETERAL STENT PLACEMENT Right 01/07/2016   Procedure: CYSTOSCOPY WITH STENT REPLACEMENT;  Surgeon: Hollice Espy, MD;  Location: [facility] ORS;  Service: Urology;  Laterality: Right;  . CYSTOSCOPY WITH RETROGRADE PYELOGRAM, URETEROSCOPY AND STENT PLACEMENT Right 12/19/2015   Procedure: CYSTOSCOPY WITH RETROGRADE PYELOGRAM, URETEROSCOPY AND STENT PLACEMENT;  Surgeon: Ardis Hughs, MD;  Location: [facility] ORS;  Service: Urology;  Laterality: Right;  . URETEROSCOPY WITH HOLMIUM LASER LITHOTRIPSY Right 01/07/2016   Procedure: URETEROSCOPY WITH HOLMIUM LASER LITHOTRIPSY;  Surgeon: Hollice Espy, MD;  Location: [facility] ORS;  Service: Urology;  Laterality: Right;    FAMILY HISTORY :   Family History  Problem Relation Age of Onset  . Breast cancer Mother 64       deceased 69  . Breast cancer Maternal Aunt 68       deceased 87s  . Prostate cancer Father        deceased 21  . Colon cancer Paternal Aunt 80       deceased 15s    SOCIAL HISTORY:   Social History   Tobacco Use  . Smoking status: Never Smoker  . Smokeless tobacco: Never Used  Substance Use Topics  . Alcohol use: No    Alcohol/week: 0.0 standard drinks  . Drug use: No    ALLERGIES:  is allergic to sulfa antibiotics.  MEDICATIONS:  Current Outpatient Medications  Medication Sig Dispense Refill  . alendronate (FOSAMAX) 70 MG tablet Take 70 mg by mouth once a week.  3  . anastrozole (ARIMIDEX) 1 MG tablet TAKE 1 TABLET BY MOUTH EVERY DAY 90 tablet 5  . Calcium Carb-Cholecalciferol  (CALCIUM 600 + D PO) Take 1 tablet by mouth daily.    . furosemide (LASIX) 40 MG tablet Take 40 mg by mouth daily as needed for fluid or edema.     Marland Kitchen labetalol (NORMODYNE) 200 MG tablet Take 200 mg by mouth 2 (two) times daily.     Marland Kitchen levothyroxine (SYNTHROID) 88 MCG tablet Take 88 mcg by mouth daily before breakfast.     . losartan-hydrochlorothiazide (HYZAAR) 100-25 MG tablet Take 1 tablet by mouth daily.    Marland Kitchen nystatin (MYCOSTATIN/NYSTOP) powder APPLY TO AFFECTED AREA TWICE A DAY 15 g 3  . octreotide (SANDOSTATIN LAR) 30 MG injection Inject 30 mg into the muscle every 28 (twenty-eight) days.     . potassium chloride SA (K-DUR,KLOR-CON) 20 MEQ tablet Take 2 tablets (40 mEq total) by mouth 2 (two) times daily. (Patient taking differently: Take 20 mEq by mouth daily. ) 120 tablet 5  . Vitamin D, Ergocalciferol, (DRISDOL) 50000 units CAPS capsule TAKE 1 CAPSULE (50,000 UNITS TOTAL) BY MOUTH ONCE A WEEK. 24 capsule 1  . KLOR-CON M10 10 MEQ tablet     .  Telotristat Ethyl,as Etiprate, (XERMELO) 250 MG TABS Take 250 mg by mouth 2 (two) times a day. 60 tablet 4   No current facility-administered medications for this visit.     PHYSICAL EXAMINATION: ECOG PERFORMANCE STATUS: 1 - Symptomatic but completely ambulatory  BP (!) 170/70   Pulse (!) 55   Temp (!) 97.1 F (36.2 C)   Resp 20   Wt 215 lb (97.5 kg)   BMI 38.09 kg/m   Filed Weights   08/06/18 0949  Weight: 215 lb (97.5 kg)    Physical Exam  Constitutional: She is oriented to person, place, and time and well-developed, well-nourished, and in no distress.  Patient is a wheelchair.  She is alone.  Patient is hard of hearing.  HENT:  Head: Normocephalic and atraumatic.  Mouth/Throat: Oropharynx is clear and moist. No oropharyngeal exudate.  Eyes: Pupils are equal, round, and reactive to light.  Neck: Normal range of motion. Neck supple.  Cardiovascular: Normal rate and regular rhythm.  Pulmonary/Chest: No respiratory distress. She has  no wheezes.  Decreased breath sounds in the right upper chest.   Abdominal: Soft. Bowel sounds are normal. She exhibits no distension and no mass. There is no abdominal tenderness. There is no rebound and no guarding.  Musculoskeletal: Normal range of motion.        General: No tenderness.  Neurological: She is alert and oriented to person, place, and time.  Skin: Skin is warm.  Psychiatric: Affect normal.    LABORATORY DATA:  I have reviewed the data as listed    Component Value Date/Time   NA 137 08/06/2018 0906   NA 137 07/12/2013 0841   K 3.4 (L) 08/06/2018 0906   K 4.7 07/12/2013 0841   CL 99 08/06/2018 0906   CL 107 07/12/2013 0841   CO2 26 08/06/2018 0906   CO2 20 (L) 07/12/2013 0841   GLUCOSE 132 (H) 08/06/2018 0906   GLUCOSE 122 (H) 07/12/2013 0841   BUN 14 08/06/2018 0906   BUN 18 07/12/2013 0841   CREATININE 0.76 08/06/2018 0906   CREATININE 0.64 05/22/2014 1111   CALCIUM 9.4 08/06/2018 0906   CALCIUM 9.2 07/12/2013 0841   PROT 7.0 08/06/2018 0906   PROT 7.0 05/22/2014 1111   ALBUMIN 3.9 08/06/2018 0906   ALBUMIN 4.2 05/22/2014 1111   AST 24 08/06/2018 0906   AST 29 05/22/2014 1111   ALT 16 08/06/2018 0906   ALT 17 05/22/2014 1111   ALKPHOS 73 08/06/2018 0906   ALKPHOS 95 05/22/2014 1111   BILITOT 1.0 08/06/2018 0906   BILITOT 0.8 05/22/2014 1111   GFRNONAA >60 08/06/2018 0906   GFRNONAA >60 05/22/2014 1111   GFRAA >60 08/06/2018 0906   GFRAA >60 05/22/2014 1111    No results found for: SPEP, UPEP  Lab Results  Component Value Date   WBC 6.1 08/06/2018   NEUTROABS 4.3 08/06/2018   HGB 11.8 (L) 08/06/2018   HCT 36.0 08/06/2018   MCV 88.0 08/06/2018   PLT 191 08/06/2018      Chemistry      Component Value Date/Time   NA 137 08/06/2018 0906   NA 137 07/12/2013 0841   K 3.4 (L) 08/06/2018 0906   K 4.7 07/12/2013 0841   CL 99 08/06/2018 0906   CL 107 07/12/2013 0841   CO2 26 08/06/2018 0906   CO2 20 (L) 07/12/2013 0841   BUN 14 08/06/2018  0906   BUN 18 07/12/2013 0841   CREATININE 0.76 08/06/2018 0906  CREATININE 0.64 05/22/2014 1111      Component Value Date/Time   CALCIUM 9.4 08/06/2018 0906   CALCIUM 9.2 07/12/2013 0841   ALKPHOS 73 08/06/2018 0906   ALKPHOS 95 05/22/2014 1111   AST 24 08/06/2018 0906   AST 29 05/22/2014 1111   ALT 16 08/06/2018 0906   ALT 17 05/22/2014 1111   BILITOT 1.0 08/06/2018 0906   BILITOT 0.8 05/22/2014 1111       RADIOGRAPHIC STUDIES: I have personally reviewed the radiological images as listed and agreed with the findings in the report. No results found.   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  Stable.  #Continue Sandostatin on a monthly basis.   # Iron deficient anemia-question AV malformation.  Hemoglobin 11.8 stable.  # chronic mild diarrhea- ? Sec to carcinoid- recommend xarmelo 250 mg BID.  Discussed with pharmacy.  #Left breast cancer stage I ER PR positive; on Arimidex.  No clinical evidence of recurrence. mammo- on July 22nd; will stop AI at next visit/   #Chronic right lower extremity swelling-dependent edema- stable.   # DISPOSITION # Sandostatin today; jug for urine collection # Follow up in 4 weeks; MD cbc/cmp/chromogranin; 24 hour 5HIAAA; Sandstatin;  Dr.B    Orders Placed This Encounter  Procedures  . 5 HIAA, quantitative, urine, 24 hour    Standing Status:   Future    Standing Expiration Date:   08/06/2019  . CBC with Differential    Standing Status:   Future    Standing Expiration Date:   08/06/2019  . Comprehensive metabolic panel    Standing Status:   Future    Standing Expiration Date:   08/06/2019  . Chromogranin A    Standing Status:   Future    Standing Expiration Date:   08/06/2019   All questions were answered.  The patient knows to call the clinic with any problems, questions or concerns.      [name], MD 08/06/2018 11:03 AM

## 2018-08-06 NOTE — Progress Notes (Signed)
Patient does not offer any problems today. The last injection site became red, swollen, and painful that lasted about 2 weeks.  She did use ice pack that offered some relief.

## 2018-08-06 NOTE — Telephone Encounter (Signed)
Oral Oncology Patient Advocate Encounter  Prior Authorization for Emma [name] has been approved.    PA# [id number] Effective dates: 08/06/2018 until further notice.  Patients co-pay is $4193.79.  We will apply for manufacturer assistance through [facility].  Oral Oncology Clinic will continue to follow.   [place] Patient [place] Phone [phone] Fax [phone] 08/06/2018 2:55 PM

## 2018-08-06 NOTE — Assessment & Plan Note (Addendum)
#   Non- functional Small bowel/mesenteric carcinoid; STAGE IV-on octreotide injections monthly' gallium PET scan January /June 2020 CT-stable/no evidence of obvious progression.  Stable.  #Continue Sandostatin on a monthly basis.   # Iron deficient anemia-question AV malformation.  Hemoglobin 11.8 stable.  # chronic mild diarrhea- ? Sec to carcinoid- recommend xarmelo 250 mg BID.  Discussed with pharmacy.  #Left breast cancer stage I ER PR positive; on Arimidex.  No clinical evidence of recurrence. mammo- on July 22nd; will stop AI at next visit/   #Chronic right lower extremity swelling-dependent edema- stable.   # DISPOSITION # Sandostatin today; jug for urine collection # Follow up in 4 weeks; MD cbc/cmp/chromogranin; 24 hour 5HIAAA; Sandstatin;  Dr.B

## 2018-08-09 LAB — CHROMOGRANIN A: Chromogranin A (ng/mL): 100.7 ng/mL (ref 0.0–101.8)

## 2018-08-10 NOTE — Telephone Encounter (Signed)
Spoke with daughter, [name], on 7/13 and she will speak to mother about getting financial documents for assistance.

## 2018-08-13 NOTE — Telephone Encounter (Signed)
Spoke with [name] this morning and she is going to speak to the patient today.  She has to wait til patient is in a "good mood" before she can ask about assistance.  She says patient is hesitant to give income documentation, but will ask.

## 2018-08-16 NOTE — Telephone Encounter (Signed)
Emma [name] (patient's daughter) called today to say that her mother could not find her social Building surveyor. Her mother does not file taxes, so tax forms can not be used as an alternative form of income documentation. I reviewed the assistance application and called [employer] patient assistance to ask about alternatives to submit. They do not accept bank statements from patients for income documentation. I suggested that [name]/her mom call the social security office to request another copy of the award letter be sent. Unfortunately, this will likely delay the start of Xermelo.

## 2018-08-18 ENCOUNTER — Other Ambulatory Visit: Payer: Self-pay

## 2018-08-18 ENCOUNTER — Ambulatory Visit
Admission: RE | Admit: 2018-08-18 | Discharge: 2018-08-18 | Disposition: A | Discharge status: Home / Self Care | Payer: Medicare Other | Source: Ambulatory Visit | Attending: Internal Medicine | Admitting: Internal Medicine

## 2018-08-18 DIAGNOSIS — Z853 Personal history of malignant neoplasm of breast: Secondary | ICD-10-CM | POA: Insufficient documentation

## 2018-08-18 DIAGNOSIS — Z1231 Encounter for screening mammogram for malignant neoplasm of breast: Secondary | ICD-10-CM | POA: Insufficient documentation

## 2018-08-30 NOTE — Telephone Encounter (Addendum)
8/3: Emma [name] returned my call.  She states that her mother cannot find the [place] letter and believes she may thrown it away.  She will try to get her mother to call Social Security to request another letter.  She stated she will call back when they have received the letter.  I will follow up in a week or 2 just to check the status.   8/3:Left voicemail for Emma [name] to return my call.  Checking to see if she had received the social security award letter yet to provide for patient assistance.

## 2018-09-02 ENCOUNTER — Other Ambulatory Visit: Payer: Self-pay

## 2018-09-03 ENCOUNTER — Inpatient Hospital Stay: Payer: Medicare Other | Attending: Internal Medicine

## 2018-09-03 ENCOUNTER — Inpatient Hospital Stay (HOSPITAL_BASED_OUTPATIENT_CLINIC_OR_DEPARTMENT_OTHER): Payer: Medicare Other | Admitting: Internal Medicine

## 2018-09-03 ENCOUNTER — Inpatient Hospital Stay: Payer: Medicare Other

## 2018-09-03 ENCOUNTER — Other Ambulatory Visit: Payer: Self-pay

## 2018-09-03 ENCOUNTER — Encounter: Payer: Self-pay | Admitting: Internal Medicine

## 2018-09-03 DIAGNOSIS — C50912 Malignant neoplasm of unspecified site of left female breast: Secondary | ICD-10-CM | POA: Insufficient documentation

## 2018-09-03 DIAGNOSIS — K6389 Other specified diseases of intestine: Secondary | ICD-10-CM

## 2018-09-03 DIAGNOSIS — M7989 Other specified soft tissue disorders: Secondary | ICD-10-CM | POA: Diagnosis not present

## 2018-09-03 DIAGNOSIS — C7B02 Secondary carcinoid tumors of liver: Secondary | ICD-10-CM | POA: Insufficient documentation

## 2018-09-03 DIAGNOSIS — E340 Carcinoid syndrome: Secondary | ICD-10-CM | POA: Insufficient documentation

## 2018-09-03 DIAGNOSIS — K625 Hemorrhage of anus and rectum: Secondary | ICD-10-CM | POA: Diagnosis not present

## 2018-09-03 DIAGNOSIS — D509 Iron deficiency anemia, unspecified: Secondary | ICD-10-CM | POA: Insufficient documentation

## 2018-09-03 DIAGNOSIS — C7A019 Malignant carcinoid tumor of the small intestine, unspecified portion: Secondary | ICD-10-CM | POA: Diagnosis present

## 2018-09-03 DIAGNOSIS — Z170 Estrogen receptor positive status [ER+]: Secondary | ICD-10-CM | POA: Diagnosis not present

## 2018-09-03 DIAGNOSIS — R197 Diarrhea, unspecified: Secondary | ICD-10-CM | POA: Insufficient documentation

## 2018-09-03 DIAGNOSIS — Z79811 Long term (current) use of aromatase inhibitors: Secondary | ICD-10-CM | POA: Insufficient documentation

## 2018-09-03 LAB — CBC WITH DIFFERENTIAL/PLATELET
Abs Immature Granulocytes: 0.02 10*3/uL (ref 0.00–0.07)
Basophils Absolute: 0 10*3/uL (ref 0.0–0.1)
Basophils Relative: 1 %
Eosinophils Absolute: 0.3 10*3/uL (ref 0.0–0.5)
Eosinophils Relative: 5 %
HCT: 35.5 % — ABNORMAL LOW (ref 36.0–46.0)
Hemoglobin: 11.8 g/dL — ABNORMAL LOW (ref 12.0–15.0)
Immature Granulocytes: 0 %
Lymphocytes Relative: 9 %
Lymphs Abs: 0.6 10*3/uL — ABNORMAL LOW (ref 0.7–4.0)
MCH: 29 pg (ref 26.0–34.0)
MCHC: 33.2 g/dL (ref 30.0–36.0)
MCV: 87.2 fL (ref 80.0–100.0)
Monocytes Absolute: 0.9 10*3/uL (ref 0.1–1.0)
Monocytes Relative: 13 %
Neutro Abs: 4.8 10*3/uL (ref 1.7–7.7)
Neutrophils Relative %: 72 %
Platelets: 198 10*3/uL (ref 150–400)
RBC: 4.07 MIL/uL (ref 3.87–5.11)
RDW: 14.4 % (ref 11.5–15.5)
WBC: 6.7 10*3/uL (ref 4.0–10.5)
nRBC: 0 % (ref 0.0–0.2)

## 2018-09-03 LAB — COMPREHENSIVE METABOLIC PANEL
ALT: 17 U/L (ref 0–44)
AST: 23 U/L (ref 15–41)
Albumin: 3.9 g/dL (ref 3.5–5.0)
Alkaline Phosphatase: 69 U/L (ref 38–126)
Anion gap: 10 (ref 5–15)
BUN: 18 mg/dL (ref 8–23)
CO2: 27 mmol/L (ref 22–32)
Calcium: 9.9 mg/dL (ref 8.9–10.3)
Chloride: 101 mmol/L (ref 98–111)
Creatinine, Ser: 0.82 mg/dL (ref 0.44–1.00)
GFR calc Af Amer: >60 mL/min (ref >60)
GFR calc non Af Amer: >60 mL/min (ref >60)
Glucose, Bld: 126 mg/dL — ABNORMAL HIGH (ref 70–99)
Potassium: 3.8 mmol/L (ref 3.5–5.1)
Sodium: 138 mmol/L (ref 135–145)
Total Bilirubin: 1 mg/dL (ref 0.3–1.2)
Total Protein: 6.9 g/dL (ref 6.5–8.1)

## 2018-09-03 MED ORDER — OCTREOTIDE ACETATE 30 MG IM KIT
30.0000 mg | PACK | Freq: Once | INTRAMUSCULAR | Status: AC
  Administered 2018-09-03: 30 mg via INTRAMUSCULAR
  Filled 2018-09-03: qty 1

## 2018-09-03 NOTE — Progress Notes (Signed)
[facility] OFFICE PROGRESS NOTE  [name], [name] [name], [name] [name], [name] [name], [name]  Cancer Staging No matching staging information was found for the patient.   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   # FEB 2018- Ga PET- uptake in liver/ mesenteric mass; NOV 2019- increase sandostatin to 50m q monthly.   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  Spoke to patient's daughter [name] the phone.  Emma [id number].  female pleasant patient above history of metastatic carcinoid tumor currently on Sandostatin and also history of early stage breast cancer ER PR  positive on Arimidex is here for follow-up.  As per the daughter patient continues to have intermittent rectal bleeding.  Patient has chronic diarrhea.   Also episodes of visual changes transient resolved.    Review of Systems  Constitutional: Positive for malaise/fatigue. Negative for chills, diaphoresis, fever and weight loss.  HENT: Negative for nosebleeds and sore throat.   Eyes: Negative for double vision.  Respiratory: Negative for hemoptysis, sputum production, shortness of breath and wheezing.   Cardiovascular: Negative for chest pain, palpitations and orthopnea.  Gastrointestinal: Positive for diarrhea. Negative for constipation, heartburn, melena, nausea and vomiting.  Genitourinary: Negative for dysuria, frequency and urgency.  Musculoskeletal: Negative for back pain and joint pain.  Skin: Negative.  Negative for itching and rash.  Neurological: Negative for dizziness, tingling, focal weakness, weakness and headaches.  Endo/Heme/Allergies: Does not bruise/bleed easily.  Psychiatric/Behavioral: Negative for depression. The patient is not nervous/anxious and does not have insomnia.       PAST MEDICAL HISTORY :  Past Medical History:  Diagnosis Date  . Aromatase inhibitor use   . Breast cancer ([place] 2014   left breast cancer/radiation  . Breast cancer, left breast ([facility] [place] 06/19/2014  . CHF (congestive heart failure) (HCC)    recent sepsis  . Chronic kidney disease   . Dizziness   . Dyspnea    recently while admitted  . GERD (gastroesophageal reflux disease)   . Hearing loss   . History of hiatal hernia   . History of kidney stones   . HOH (hard of hearing)    severe  . Hypertension   . Hypothyroidism   . Leg  DVT (deep venous thromboembolism), acute ([place]) 07/2015   left leg after fracture  . 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 Pain    chest pain while admitted  . Personal history of radiation therapy 2015   left breast ca  . Thyroid disease     PAST SURGICAL HISTORY  :   Past Surgical History:  Procedure Laterality Date  . ABDOMINAL HYSTERECTOMY     partial  . BREAST LUMPECTOMY Left 01/13/2013   invasive mammary carcinoma, clear margins, LN negative  . BREAST LUMPECTOMY WITH SENTINEL LYMPH NODE BIOPSY Left 2014  . CHOLECYSTECTOMY    . COLONOSCOPY WITH PROPOFOL N/A 09/17/2017   Procedure: COLONOSCOPY WITH PROPOFOL;  Surgeon: Jonathon Bellows, MD;  Location: [facility] ENDOSCOPY;  Service: Gastroenterology;  Laterality: N/A;  . CYSTOSCOPY W/ URETERAL STENT PLACEMENT Right 01/07/2016   Procedure: CYSTOSCOPY WITH STENT REPLACEMENT;  Surgeon: Hollice Espy, MD;  Location: [facility] ORS;  Service: Urology;  Laterality: Right;  . CYSTOSCOPY WITH RETROGRADE PYELOGRAM, URETEROSCOPY AND STENT PLACEMENT Right 12/19/2015   Procedure: CYSTOSCOPY WITH RETROGRADE PYELOGRAM, URETEROSCOPY AND STENT PLACEMENT;  Surgeon: Ardis Hughs, MD;  Location: [facility] ORS;  Service: Urology;  Laterality: Right;  . URETEROSCOPY WITH HOLMIUM LASER LITHOTRIPSY Right 01/07/2016   Procedure: URETEROSCOPY WITH HOLMIUM LASER LITHOTRIPSY;  Surgeon: Hollice Espy, MD;  Location: [facility] ORS;  Service: Urology;  Laterality: Right;    FAMILY HISTORY :   Family History  Problem Relation Age of Onset  . Breast cancer Mother 12       deceased 24  . Breast cancer Maternal Aunt 68       deceased 73s  . Prostate cancer Father        deceased 47  . Colon cancer Paternal Aunt 80       deceased 46s    SOCIAL HISTORY:   Social History   Tobacco Use  . Smoking status: Never Smoker  . Smokeless tobacco: Never Used  Substance Use Topics  . Alcohol use: No    Alcohol/week: 0.0 standard drinks  . Drug use: No    ALLERGIES:  is allergic to sulfa antibiotics.  MEDICATIONS:  Current Outpatient Medications  Medication Sig Dispense Refill  . alendronate (FOSAMAX) 70 MG tablet Take 70 mg by mouth once a week.  3  . anastrozole (ARIMIDEX) 1 MG tablet TAKE 1 TABLET BY MOUTH EVERY DAY 90 tablet 5  . Calcium  Carb-Cholecalciferol (CALCIUM 600 + D PO) Take 1 tablet by mouth daily.    . furosemide (LASIX) 40 MG tablet Take 40 mg by mouth daily as needed for fluid or edema.     Emma [name] KLOR-CON M10 10 MEQ tablet     . labetalol (NORMODYNE) 200 MG tablet Take 200 mg by mouth 2 (two) times daily.     Emma [name] levothyroxine (SYNTHROID) 88 MCG tablet Take 88 mcg by mouth daily before breakfast.     . losartan-hydrochlorothiazide (HYZAAR) 100-25 MG tablet Take 1 tablet by mouth daily.    Emma [name] nystatin (MYCOSTATIN/NYSTOP) powder APPLY TO AFFECTED AREA TWICE A DAY 15 g 3  . octreotide (SANDOSTATIN LAR) 30 MG injection Inject 30 mg into the muscle every 28 (twenty-eight) days.     . Vitamin D, Ergocalciferol, (DRISDOL) 50000 units CAPS capsule TAKE 1 CAPSULE (50,000 UNITS TOTAL) BY MOUTH ONCE A WEEK. 24 capsule 1  . Telotristat Ethyl,as Etiprate, (XERMELO) 250 MG TABS Take 250 mg by mouth 2 (two) times a day. 60 tablet 4   No current facility-administered medications for this  visit.     PHYSICAL EXAMINATION: ECOG PERFORMANCE STATUS: 1 - Symptomatic but completely ambulatory  BP (!) 158/75   Pulse (!) 59   Temp (!) 97.5 F (36.4 C) (Tympanic)   Resp 20   Ht 5' 3"  (1.6 m)   Wt 222 lb (100.7 kg)   BMI 39.33 kg/m   Filed Weights   09/03/18 1021  Weight: 222 lb (100.7 kg)    Physical Exam  Constitutional: Emma [name] is oriented to person, place, and time and well-developed, well-nourished, and in no distress.  Patient is a wheelchair.  Emma [name] is alone.  Patient is hard of hearing.  HENT:  Head: Normocephalic and atraumatic.  Mouth/Throat: Oropharynx is clear and moist. No oropharyngeal exudate.  Eyes: Pupils are equal, round, and reactive to light.  Neck: Normal range of motion. Neck supple.  Cardiovascular: Normal rate and regular rhythm.  Pulmonary/Chest: No respiratory distress. Emma [name] has no wheezes.  Decreased breath sounds in the right upper chest.   Abdominal: Soft. Bowel sounds are normal. Emma [name] exhibits no  distension and no mass. There is no abdominal tenderness. There is no rebound and no guarding.  Musculoskeletal: Normal range of motion.        General: No tenderness.  Neurological: Emma [name] is alert and oriented to person, place, and time.  Skin: Skin is warm.  Psychiatric: Affect normal.    LABORATORY DATA:  I have reviewed the data as listed    Component Value Date/Time   NA 138 09/03/2018 1009   NA 137 07/12/2013 0841   K 3.8 09/03/2018 1009   K 4.7 07/12/2013 0841   CL 101 09/03/2018 1009   CL 107 07/12/2013 0841   CO2 27 09/03/2018 1009   CO2 20 (L) 07/12/2013 0841   GLUCOSE 126 (H) 09/03/2018 1009   GLUCOSE 122 (H) 07/12/2013 0841   BUN 18 09/03/2018 1009   BUN 18 07/12/2013 0841   CREATININE 0.82 09/03/2018 1009   CREATININE 0.64 05/22/2014 1111   CALCIUM 9.9 09/03/2018 1009   CALCIUM 9.2 07/12/2013 0841   PROT 6.9 09/03/2018 1009   PROT 7.0 05/22/2014 1111   ALBUMIN 3.9 09/03/2018 1009   ALBUMIN 4.2 05/22/2014 1111   AST 23 09/03/2018 1009   AST 29 05/22/2014 1111   ALT 17 09/03/2018 1009   ALT 17 05/22/2014 1111   ALKPHOS 69 09/03/2018 1009   ALKPHOS 95 05/22/2014 1111   BILITOT 1.0 09/03/2018 1009   BILITOT 0.8 05/22/2014 1111   GFRNONAA >60 09/03/2018 1009   GFRNONAA >60 05/22/2014 1111   GFRAA >60 09/03/2018 1009   GFRAA >60 05/22/2014 1111    No results found for: SPEP, UPEP  Lab Results  Component Value Date   WBC 6.7 09/03/2018   NEUTROABS 4.8 09/03/2018   HGB 11.8 (L) 09/03/2018   HCT 35.5 (L) 09/03/2018   MCV 87.2 09/03/2018   PLT 198 09/03/2018      Chemistry      Component Value Date/Time   NA 138 09/03/2018 1009   NA 137 07/12/2013 0841   K 3.8 09/03/2018 1009   K 4.7 07/12/2013 0841   CL 101 09/03/2018 1009   CL 107 07/12/2013 0841   CO2 27 09/03/2018 1009   CO2 20 (L) 07/12/2013 0841   BUN 18 09/03/2018 1009   BUN 18 07/12/2013 0841   CREATININE 0.82 09/03/2018 1009   CREATININE 0.64 05/22/2014 1111      Component Value  Date/Time   CALCIUM 9.9 09/03/2018 1009   CALCIUM  9.2 07/12/2013 0841   ALKPHOS 69 09/03/2018 1009   ALKPHOS 95 05/22/2014 1111   AST 23 09/03/2018 1009   AST 29 05/22/2014 1111   ALT 17 09/03/2018 1009   ALT 17 05/22/2014 1111   BILITOT 1.0 09/03/2018 1009   BILITOT 0.8 05/22/2014 1111       RADIOGRAPHIC STUDIES: I have personally reviewed the radiological images as listed and agreed with the findings in the report. No results found.   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  Stable.  # Continue Sandostatin on a monthly basis.   # Iron deficient anemia-question AV malformation.  Hemoglobin 11.8 stable  # Rectal bleeding-/ but Hb- stable; 11.8; status post banding with GI. [If this continues to be a problem; recommend follow-up with GI [Dr.vanga]  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  Discussed with pharmacy.  Awaiting information from family for financial assistance.  # Left breast cancer stage I ER PR positive; on Arimidex.  No clinical evidence of recurrence. mammo- on July 2020 normal.  Recommend discontinuation of Arimidex today.  # Chronic right lower extremity swelling-dependent edema-stable  #Discussed with daughter [name] regarding above.  # DISPOSITION # Sandostatin today;  # Follow up in 4 weeks; MD cbc/cmp/Sandstatin;  Dr.B    No orders of the defined types were placed in this encounter.  All questions were answered. The patient knows to call the clinic with any problems, questions or concerns.      [name], MD 09/03/2018 12:52 PM

## 2018-09-03 NOTE — Assessment & Plan Note (Addendum)
#   Non- functional Small bowel/mesenteric carcinoid; STAGE IV-on octreotide injections monthly' gallium PET scan January /June 2020 CT-stable/no evidence of obvious progression.  Stable.  # Continue Sandostatin on a monthly basis.   # Iron deficient anemia-question AV malformation.  Hemoglobin 11.8 stable  # Rectal bleeding-/ but Hb- stable; 11.8; status post banding with GI. [If this continues to be a problem; recommend follow-up with GI [Dr.vanga]  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  Discussed with pharmacy.  Awaiting information from family for financial assistance.  # Left breast cancer stage I ER PR positive; on Arimidex.  No clinical evidence of recurrence. mammo- on July 2020 normal.  Recommend discontinuation of Arimidex today.  # Chronic right lower extremity swelling-dependent edema-stable  #Visual changes-inversion of images-transient.  If reoccurs recommend-evaluation with PCP/neurology.  Daughter in agreement.  #Discussed with daughter [name] regarding above.  # DISPOSITION # Sandostatin today;  # Follow up in 4 weeks; MD cbc/cmp/Sandstatin;  Dr.B

## 2018-09-06 LAB — CHROMOGRANIN A: Chromogranin A (ng/mL): 132.7 ng/mL — ABNORMAL HIGH (ref 0.0–101.8)

## 2018-09-16 NOTE — Telephone Encounter (Signed)
Left voicemail for daughter, [name], to return my call.  Checking to see if they had requested a new SSA letter yet.

## 2018-10-05 NOTE — Telephone Encounter (Signed)
Called to follow up with daughter, Emma [name], about getting SSA letter for patient assistance.  She states that her mother hasn't called to get the letter resent.    Emma [name] thinks it would be beneficial for [place] to speak with her at her next appointment on 9/16 to reiterate the importance of this medication and how easy the process is to receive the drug if we have the right information.    I will also provide her with a paper on how to create a Social Security account online so she can print off her award letter.    [name] Patient [place] Phone [phone] Fax [phone] 10/05/2018 1:47 PM

## 2018-10-12 ENCOUNTER — Other Ambulatory Visit: Payer: Self-pay | Admitting: *Deleted

## 2018-10-12 ENCOUNTER — Other Ambulatory Visit: Payer: Self-pay

## 2018-10-12 ENCOUNTER — Encounter: Payer: Self-pay | Admitting: Internal Medicine

## 2018-10-12 DIAGNOSIS — C50912 Malignant neoplasm of unspecified site of left female breast: Secondary | ICD-10-CM | POA: Diagnosis not present

## 2018-10-12 DIAGNOSIS — D509 Iron deficiency anemia, unspecified: Secondary | ICD-10-CM | POA: Diagnosis not present

## 2018-10-12 DIAGNOSIS — Z79811 Long term (current) use of aromatase inhibitors: Secondary | ICD-10-CM | POA: Diagnosis not present

## 2018-10-12 DIAGNOSIS — Z170 Estrogen receptor positive status [ER+]: Secondary | ICD-10-CM | POA: Insufficient documentation

## 2018-10-12 DIAGNOSIS — R197 Diarrhea, unspecified: Secondary | ICD-10-CM | POA: Diagnosis not present

## 2018-10-12 DIAGNOSIS — K625 Hemorrhage of anus and rectum: Secondary | ICD-10-CM | POA: Insufficient documentation

## 2018-10-12 DIAGNOSIS — C7A019 Malignant carcinoid tumor of the small intestine, unspecified portion: Secondary | ICD-10-CM | POA: Diagnosis present

## 2018-10-12 DIAGNOSIS — E340 Carcinoid syndrome: Secondary | ICD-10-CM | POA: Insufficient documentation

## 2018-10-12 DIAGNOSIS — Z86718 Personal history of other venous thrombosis and embolism: Secondary | ICD-10-CM | POA: Insufficient documentation

## 2018-10-12 DIAGNOSIS — C7B02 Secondary carcinoid tumors of liver: Secondary | ICD-10-CM | POA: Diagnosis not present

## 2018-10-12 DIAGNOSIS — Z23 Encounter for immunization: Secondary | ICD-10-CM | POA: Insufficient documentation

## 2018-10-12 NOTE — Progress Notes (Signed)
Patient pre screened for office appointment, no questions or concerns today.Patient unable to answer daughter was able to provide information.

## 2018-10-13 ENCOUNTER — Inpatient Hospital Stay: Payer: Medicare Other | Attending: Internal Medicine

## 2018-10-13 ENCOUNTER — Inpatient Hospital Stay (HOSPITAL_BASED_OUTPATIENT_CLINIC_OR_DEPARTMENT_OTHER): Payer: Medicare Other | Admitting: Internal Medicine

## 2018-10-13 ENCOUNTER — Inpatient Hospital Stay: Payer: Medicare Other

## 2018-10-13 ENCOUNTER — Other Ambulatory Visit: Payer: Self-pay

## 2018-10-13 DIAGNOSIS — C7B02 Secondary carcinoid tumors of liver: Secondary | ICD-10-CM | POA: Diagnosis not present

## 2018-10-13 DIAGNOSIS — K6389 Other specified diseases of intestine: Secondary | ICD-10-CM

## 2018-10-13 DIAGNOSIS — C7A019 Malignant carcinoid tumor of the small intestine, unspecified portion: Secondary | ICD-10-CM

## 2018-10-13 DIAGNOSIS — Z23 Encounter for immunization: Secondary | ICD-10-CM

## 2018-10-13 LAB — COMPREHENSIVE METABOLIC PANEL
ALT: 15 U/L (ref 0–44)
AST: 25 U/L (ref 15–41)
Albumin: 3.8 g/dL (ref 3.5–5.0)
Alkaline Phosphatase: 69 U/L (ref 38–126)
Anion gap: 8 (ref 5–15)
BUN: 14 mg/dL (ref 8–23)
CO2: 29 mmol/L (ref 22–32)
Calcium: 9.4 mg/dL (ref 8.9–10.3)
Chloride: 100 mmol/L (ref 98–111)
Creatinine, Ser: 0.78 mg/dL (ref 0.44–1.00)
GFR calc Af Amer: >60 mL/min (ref >60)
GFR calc non Af Amer: >60 mL/min (ref >60)
Glucose, Bld: 129 mg/dL — ABNORMAL HIGH (ref 70–99)
Potassium: 3.2 mmol/L — ABNORMAL LOW (ref 3.5–5.1)
Sodium: 137 mmol/L (ref 135–145)
Total Bilirubin: 0.9 mg/dL (ref 0.3–1.2)
Total Protein: 6.6 g/dL (ref 6.5–8.1)

## 2018-10-13 LAB — CBC WITH DIFFERENTIAL/PLATELET
Abs Immature Granulocytes: 0.01 10*3/uL (ref 0.00–0.07)
Basophils Absolute: 0 10*3/uL (ref 0.0–0.1)
Basophils Relative: 1 %
Eosinophils Absolute: 0.3 10*3/uL (ref 0.0–0.5)
Eosinophils Relative: 5 %
HCT: 35.4 % — ABNORMAL LOW (ref 36.0–46.0)
Hemoglobin: 11.3 g/dL — ABNORMAL LOW (ref 12.0–15.0)
Immature Granulocytes: 0 %
Lymphocytes Relative: 9 %
Lymphs Abs: 0.6 10*3/uL — ABNORMAL LOW (ref 0.7–4.0)
MCH: 28.1 pg (ref 26.0–34.0)
MCHC: 31.9 g/dL (ref 30.0–36.0)
MCV: 88.1 fL (ref 80.0–100.0)
Monocytes Absolute: 0.8 10*3/uL (ref 0.1–1.0)
Monocytes Relative: 12 %
Neutro Abs: 4.8 10*3/uL (ref 1.7–7.7)
Neutrophils Relative %: 73 %
Platelets: 201 10*3/uL (ref 150–400)
RBC: 4.02 MIL/uL (ref 3.87–5.11)
RDW: 13.9 % (ref 11.5–15.5)
WBC: 6.5 10*3/uL (ref 4.0–10.5)
nRBC: 0 % (ref 0.0–0.2)

## 2018-10-13 MED ORDER — INFLUENZA VAC SPLIT HIGH-DOSE 0.5 ML IM SUSY: 0.5000 mL | PREFILLED_SYRINGE | Freq: Once | INTRAMUSCULAR | Status: DC

## 2018-10-13 MED ORDER — OCTREOTIDE ACETATE 30 MG IM KIT
30.0000 mg | PACK | Freq: Once | INTRAMUSCULAR | Status: AC
  Administered 2018-10-13: 30 mg via INTRAMUSCULAR
  Filled 2018-10-13: qty 1

## 2018-10-13 MED ORDER — INFLUENZA VAC A&B SA ADJ QUAD 0.5 ML IM PRSY
0.5000 mL | PREFILLED_SYRINGE | INTRAMUSCULAR | Status: AC
  Administered 2018-10-13: 0.5 mL via INTRAMUSCULAR
  Filled 2018-10-13: qty 0.5

## 2018-10-13 NOTE — Assessment & Plan Note (Addendum)
#   Non- functional Small bowel/mesenteric carcinoid; STAGE IV-on octreotide injections monthly' gallium PET scan January /June 2020 CT-stable/no evidence of obvious progression.  STABLE.   # Continue Sandostatin on a monthly basis.   # Iron deficient anemia-question AV malformation.  Hemoglobin 11.2-overall STABLE.  Will check iron studies possible Venofer at next visit  # Rectal bleeding-/ but Hb- stable; 11.2; status post banding with GI. [If this continues to be a problem; recommend follow-up with GI [Dr.[place]]; patient on hyoscyamine pills for cramping; declined hemorrhoidal suppository.  Recommend follow-up with GI if continues to have bleeding.  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  Approved by insurance; patient elected to share her financial information/for patient assistance program.  Discussed with the patient's daughter.  Bethena Roys also discussed with patient.  # Left breast cancer stage I ER PR positive; on surveillance.  no clinical evidence of recurrence. mammo- on July 2020 normal.    # Chronic right lower extremity swelling-dependent edema stable.   # Visual changes-inversion of images-transient. None; improved.   I spoke at length with the patient's family/[name]- regarding the patient's clinical status/plan of care.  Family agreement.   # Flu- shot agrees.   # DISPOSITION; check iron studies/ferritin # Sandostatin today;  # Follow up in 4 weeks; MD cbc/cmpchromogranin-A;-/Sandstatin;possible Venofer/  Dr.B

## 2018-10-13 NOTE — Progress Notes (Signed)
Pt states her cramping is better since the GI md gave her hyoscyamine. She had to be careful with it due to it can cause constipation

## 2018-10-13 NOTE — Telephone Encounter (Signed)
Spoke with patient at her appointment today.  I talked to her about getting assistance for [facility] and that we needed her [place].  She is hard of hearing.  She states that she didn't know about getting assistance.  I told her I had been talking to her daughter for the past month about getting the letter so she could get the medication.  I gave her a print out from the [employer] website on how to set up an account so she could print out her award letter.  I told her to get with her daughter so she could help her set it up.  I also told her she could call the Social Security office and request another letter be sent.  I will follow up with [name] this afternoon.

## 2018-10-13 NOTE — Progress Notes (Signed)
[place] OFFICE PROGRESS NOTE  [name], [name] [name], [name] [name], [name] [name], [name]  Cancer Staging No matching staging information was found for the patient.   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   # FEB 2018- Ga PET- uptake in liver/ mesenteric mass; NOV 2019- increase sandostatin to 70m q monthly.   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  Spoke to patient's daughter [name] the phone.  [name].  female pleasant patient above history of metastatic carcinoid tumor currently on Sandostatin and also history of early stage breast cancer  ER PR positive on Arimidex is here for follow-up.  As per the daughter patient continues to have intermittent rectal bleeding. She continues to have diarrhea.  Denies any flushing.  Also episodes of visual changes transient resolved.    Review of Systems  Constitutional: Positive for malaise/fatigue. Negative for chills, diaphoresis, fever and weight loss.  HENT: Negative for nosebleeds and sore throat.   Eyes: Negative for double vision.  Respiratory: Negative for hemoptysis, sputum production, shortness of breath and wheezing.   Cardiovascular: Positive for leg swelling. Negative for chest pain, palpitations and orthopnea.  Gastrointestinal: Positive for blood in stool and diarrhea. Negative for constipation, heartburn, melena, nausea and vomiting.  Genitourinary: Negative for dysuria, frequency and urgency.  Musculoskeletal: Negative for back pain and joint pain.  Skin: Negative.  Negative for itching and rash.  Neurological: Negative for dizziness, tingling, focal weakness, weakness and headaches.  Endo/Heme/Allergies: Does not bruise/bleed easily.  Psychiatric/Behavioral: Negative for depression. The patient is not nervous/anxious and does not have insomnia.       PAST MEDICAL HISTORY :  Past Medical History:  Diagnosis Date  . Aromatase inhibitor use   . Breast cancer ([facility] 2014   left breast cancer/radiation  . [place], [place] [place]  . CHF (congestive heart failure) (HCC)    recent sepsis  . Chronic kidney disease   . Dizziness   . Dyspnea    recently while admitted  . GERD (gastroesophageal reflux disease)   . Hearing loss   . History of hiatal hernia   . History of kidney stones   . HOH (hard of hearing)  severe  . Hypertension   . Hypothyroidism   . Leg DVT (deep venous thromboembolism), acute ([place]) 07/2015   left leg after fracture  . 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 Pain    chest pain while admitted  . Personal history of radiation  therapy 2015   left breast ca  . Thyroid disease     PAST SURGICAL HISTORY :   Past Surgical History:  Procedure Laterality Date  . ABDOMINAL HYSTERECTOMY     partial  . BREAST LUMPECTOMY Left 01/13/2013   invasive mammary carcinoma, clear margins, LN negative  . BREAST LUMPECTOMY WITH SENTINEL LYMPH NODE BIOPSY Left 2014  . CHOLECYSTECTOMY    . COLONOSCOPY WITH PROPOFOL N/A 09/17/2017   Procedure: COLONOSCOPY WITH PROPOFOL;  Surgeon: Jonathon Bellows, MD;  Location: [facility] ENDOSCOPY;  Service: Gastroenterology;  Laterality: N/A;  . CYSTOSCOPY W/ URETERAL STENT PLACEMENT Right 01/07/2016   Procedure: CYSTOSCOPY WITH STENT REPLACEMENT;  Surgeon: Hollice Espy, MD;  Location: [facility] ORS;  Service: Urology;  Laterality: Right;  . CYSTOSCOPY WITH RETROGRADE PYELOGRAM, URETEROSCOPY AND STENT PLACEMENT Right 12/19/2015   Procedure: CYSTOSCOPY WITH RETROGRADE PYELOGRAM, URETEROSCOPY AND STENT PLACEMENT;  Surgeon: Ardis Hughs, MD;  Location: [facility] ORS;  Service: Urology;  Laterality: Right;  . URETEROSCOPY WITH HOLMIUM LASER LITHOTRIPSY Right 01/07/2016   Procedure: URETEROSCOPY WITH HOLMIUM LASER LITHOTRIPSY;  Surgeon: Hollice Espy, MD;  Location: [facility] ORS;  Service: Urology;  Laterality: Right;    FAMILY HISTORY :   Family History  Problem Relation Age of Onset  . Breast cancer Mother 72       deceased 28  . Breast cancer Maternal Aunt 68       deceased 73s  . Prostate cancer Father        deceased 43  . Colon cancer Paternal Aunt 80       deceased 47s    SOCIAL HISTORY:   Social History   Tobacco Use  . Smoking status: Never Smoker  . Smokeless tobacco: Never Used  Substance Use Topics  . Alcohol use: No    Alcohol/week: 0.0 standard drinks  . Drug use: No    ALLERGIES:  is allergic to sulfa antibiotics.  MEDICATIONS:  Current Outpatient Medications  Medication Sig Dispense Refill  . alendronate (FOSAMAX) 70 MG tablet Take 70 mg by mouth once a week.  3  . anastrozole  (ARIMIDEX) 1 MG tablet TAKE 1 TABLET BY MOUTH EVERY DAY 90 tablet 5  . Calcium Carb-Cholecalciferol (CALCIUM 600 + D PO) Take 1 tablet by mouth daily.    . furosemide (LASIX) 40 MG tablet Take 40 mg by mouth daily as needed for fluid or edema.     . hyoscyamine (ANASPAZ) 0.125 MG TBDP disintergrating tablet Place 0.25 mg under the tongue every 4 (four) hours as needed for cramping.    Marland Kitchen KLOR-CON M10 10 MEQ tablet     . labetalol (NORMODYNE) 200 MG tablet Take 200 mg by mouth 2 (two) times daily.     Marland Kitchen levothyroxine (SYNTHROID) 88 MCG tablet Take 88 mcg by mouth daily before breakfast.     . losartan-hydrochlorothiazide (HYZAAR) 100-25 MG tablet Take 1 tablet by mouth daily.    Marland Kitchen nystatin (MYCOSTATIN/NYSTOP) powder APPLY TO AFFECTED AREA TWICE A DAY 15 g 3  . octreotide (SANDOSTATIN LAR) 30 MG injection Inject 30 mg into the muscle every 28 (twenty-eight) days.     . Vitamin D, Ergocalciferol, (DRISDOL) 50000 units CAPS capsule TAKE 1 CAPSULE (50,000 UNITS TOTAL) BY  MOUTH ONCE A WEEK. 24 capsule 1  . Telotristat Ethyl,as Etiprate, (XERMELO) 250 MG TABS Take 250 mg by mouth 2 (two) times a day. (Patient not taking: Reported on 10/13/2018) 60 tablet 4   No current facility-administered medications for this visit.     PHYSICAL EXAMINATION: ECOG PERFORMANCE STATUS: 1 - Symptomatic but completely ambulatory  BP (!) 147/74   Pulse 62   Temp (!) 97.4 F (36.3 C) (Tympanic)   Resp 16   Wt 230 lb (104.3 kg)   BMI 40.74 kg/m   Filed Weights   10/13/18 1052  Weight: 230 lb (104.3 kg)    Physical Exam  Constitutional: She is oriented to person, place, and time and well-developed, well-nourished, and in no distress.  Patient is a wheelchair.  She is alone.  Patient is hard of hearing.  HENT:  Head: Normocephalic and atraumatic.  Mouth/Throat: Oropharynx is clear and moist. No oropharyngeal exudate.  Eyes: Pupils are equal, round, and reactive to light.  Neck: Normal range of motion. Neck  supple.  Cardiovascular: Normal rate and regular rhythm.  Pulmonary/Chest: No respiratory distress. She has no wheezes.  Decreased breath sounds in the right upper chest.   Abdominal: Soft. Bowel sounds are normal. She exhibits no distension and no mass. There is no abdominal tenderness. There is no rebound and no guarding.  Musculoskeletal: Normal range of motion.        General: No tenderness.  Neurological: She is alert and oriented to person, place, and time.  Skin: Skin is warm.  Psychiatric: Affect normal.    LABORATORY DATA:  I have reviewed the data as listed    Component Value Date/Time   NA 137 10/13/2018 1014   NA 137 07/12/2013 0841   K 3.2 (L) 10/13/2018 1014   K 4.7 07/12/2013 0841   CL 100 10/13/2018 1014   CL 107 07/12/2013 0841   CO2 29 10/13/2018 1014   CO2 20 (L) 07/12/2013 0841   GLUCOSE 129 (H) 10/13/2018 1014   GLUCOSE 122 (H) 07/12/2013 0841   BUN 14 10/13/2018 1014   BUN 18 07/12/2013 0841   CREATININE 0.78 10/13/2018 1014   CREATININE 0.64 05/22/2014 1111   CALCIUM 9.4 10/13/2018 1014   CALCIUM 9.2 07/12/2013 0841   PROT 6.6 10/13/2018 1014   PROT 7.0 05/22/2014 1111   ALBUMIN 3.8 10/13/2018 1014   ALBUMIN 4.2 05/22/2014 1111   AST 25 10/13/2018 1014   AST 29 05/22/2014 1111   ALT 15 10/13/2018 1014   ALT 17 05/22/2014 1111   ALKPHOS 69 10/13/2018 1014   ALKPHOS 95 05/22/2014 1111   BILITOT 0.9 10/13/2018 1014   BILITOT 0.8 05/22/2014 1111   GFRNONAA >60 10/13/2018 1014   GFRNONAA >60 05/22/2014 1111   GFRAA >60 10/13/2018 1014   GFRAA >60 05/22/2014 1111    No results found for: SPEP, UPEP  Lab Results  Component Value Date   WBC 6.5 10/13/2018   NEUTROABS 4.8 10/13/2018   HGB 11.3 (L) 10/13/2018   HCT 35.4 (L) 10/13/2018   MCV 88.1 10/13/2018   PLT 201 10/13/2018      Chemistry      Component Value Date/Time   NA 137 10/13/2018 1014   NA 137 07/12/2013 0841   K 3.2 (L) 10/13/2018 1014   K 4.7 07/12/2013 0841   CL 100  10/13/2018 1014   CL 107 07/12/2013 0841   CO2 29 10/13/2018 1014   CO2 20 (L) 07/12/2013 0841   BUN 14  10/13/2018 1014   BUN 18 07/12/2013 0841   CREATININE 0.78 10/13/2018 1014   CREATININE 0.64 05/22/2014 1111      Component Value Date/Time   CALCIUM 9.4 10/13/2018 1014   CALCIUM 9.2 07/12/2013 0841   ALKPHOS 69 10/13/2018 1014   ALKPHOS 95 05/22/2014 1111   AST 25 10/13/2018 1014   AST 29 05/22/2014 1111   ALT 15 10/13/2018 1014   ALT 17 05/22/2014 1111   BILITOT 0.9 10/13/2018 1014   BILITOT 0.8 05/22/2014 1111       RADIOGRAPHIC STUDIES: I have personally reviewed the radiological images as listed and agreed with the findings in the report. No results found.   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  STABLE.   # Continue Sandostatin on a monthly basis.   # Iron deficient anemia-question AV malformation.  Hemoglobin 11.2-overall STABLE.  Will check iron studies possible Venofer at next visit  # Rectal bleeding-/ but Hb- stable; 11.2; status post banding with GI. [If this continues to be a problem; recommend follow-up with GI [Dr.[place]]; patient on hyoscyamine pills for cramping; declined hemorrhoidal suppository.  Recommend follow-up with GI if continues to have bleeding.  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  Approved by insurance; patient elected to share her financial information/for patient assistance program.  Discussed with the patient's daughter.  Bethena Roys also discussed with patient.  # Left breast cancer stage I ER PR positive; on surveillance.  no clinical evidence of recurrence. mammo- on July 2020 normal.    # Chronic right lower  extremity swelling-dependent edema stable.   # Visual changes-inversion of images-transient. None; improved.   I spoke at length with the patient's family/[name]- regarding the patient's clinical status/plan of care.  Family agreement.   # Flu- shot agrees.   # DISPOSITION; check iron studies/ferritin # Sandostatin today;  # Follow up in 4 weeks; MD cbc/cmpchromogranin-A;-/Sandstatin;possible Venofer/  Dr.B   No orders of the defined types were placed in this encounter.  All questions were answered. The patient knows to call the clinic with any problems, questions or concerns.      [name], MD 10/13/2018 12:28 PM

## 2018-10-17 LAB — 5 HIAA, QUANTITATIVE, URINE, 24 HOUR
5-HIAA, Ur: 7.6 mg/L
5-HIAA,Quant.,24 Hr Urine: 3.4 mg/24 hr (ref 0.0–14.9)
Total Volume: 450

## 2018-10-20 NOTE — Telephone Encounter (Signed)
Called and spoke with [name] on 10/19/2018 regarding conversation with her mother last week.  I informed her of the paper I gave her to sign up for MySocialSecurity online to print the social security award.  She will speak with her mother to see if she can get her to do this.  She also mentioned her having memory issues lately.

## 2018-10-25 NOTE — Telephone Encounter (Signed)
Closing this encounter until daughter reaches out.

## 2018-11-04 ENCOUNTER — Other Ambulatory Visit: Payer: Self-pay | Admitting: Neurology

## 2018-11-04 DIAGNOSIS — G43119 Migraine with aura, intractable, without status migrainosus: Secondary | ICD-10-CM

## 2018-11-09 ENCOUNTER — Other Ambulatory Visit: Payer: Self-pay

## 2018-11-09 ENCOUNTER — Encounter: Payer: Self-pay | Admitting: Internal Medicine

## 2018-11-09 DIAGNOSIS — C7B02 Secondary carcinoid tumors of liver: Secondary | ICD-10-CM

## 2018-11-09 DIAGNOSIS — R739 Hyperglycemia, unspecified: Secondary | ICD-10-CM | POA: Insufficient documentation

## 2018-11-09 NOTE — Progress Notes (Signed)
Patient stated that she had been doing well. Patient's daughter would like a call when physician is in the room with her mother.

## 2018-11-10 ENCOUNTER — Inpatient Hospital Stay: Payer: Medicare Other

## 2018-11-10 ENCOUNTER — Inpatient Hospital Stay (HOSPITAL_BASED_OUTPATIENT_CLINIC_OR_DEPARTMENT_OTHER): Payer: Medicare Other | Admitting: Internal Medicine

## 2018-11-10 ENCOUNTER — Other Ambulatory Visit: Payer: Self-pay

## 2018-11-10 ENCOUNTER — Inpatient Hospital Stay: Payer: Medicare Other | Attending: Internal Medicine

## 2018-11-10 DIAGNOSIS — C7A019 Malignant carcinoid tumor of the small intestine, unspecified portion: Secondary | ICD-10-CM | POA: Diagnosis present

## 2018-11-10 DIAGNOSIS — Z853 Personal history of malignant neoplasm of breast: Secondary | ICD-10-CM | POA: Insufficient documentation

## 2018-11-10 DIAGNOSIS — E340 Carcinoid syndrome: Secondary | ICD-10-CM | POA: Insufficient documentation

## 2018-11-10 DIAGNOSIS — K6389 Other specified diseases of intestine: Secondary | ICD-10-CM

## 2018-11-10 DIAGNOSIS — R197 Diarrhea, unspecified: Secondary | ICD-10-CM | POA: Insufficient documentation

## 2018-11-10 DIAGNOSIS — C7B02 Secondary carcinoid tumors of liver: Secondary | ICD-10-CM

## 2018-11-10 DIAGNOSIS — D509 Iron deficiency anemia, unspecified: Secondary | ICD-10-CM | POA: Insufficient documentation

## 2018-11-10 DIAGNOSIS — K625 Hemorrhage of anus and rectum: Secondary | ICD-10-CM | POA: Diagnosis not present

## 2018-11-10 LAB — COMPREHENSIVE METABOLIC PANEL
ALT: 15 U/L (ref 0–44)
AST: 23 U/L (ref 15–41)
Albumin: 3.9 g/dL (ref 3.5–5.0)
Alkaline Phosphatase: 80 U/L (ref 38–126)
Anion gap: 8 (ref 5–15)
BUN: 17 mg/dL (ref 8–23)
CO2: 28 mmol/L (ref 22–32)
Calcium: 9.3 mg/dL (ref 8.9–10.3)
Chloride: 103 mmol/L (ref 98–111)
Creatinine, Ser: 0.75 mg/dL (ref 0.44–1.00)
GFR calc Af Amer: >60 mL/min (ref >60)
GFR calc non Af Amer: >60 mL/min (ref >60)
Glucose, Bld: 126 mg/dL — ABNORMAL HIGH (ref 70–99)
Potassium: 3 mmol/L — ABNORMAL LOW (ref 3.5–5.1)
Sodium: 139 mmol/L (ref 135–145)
Total Bilirubin: 0.7 mg/dL (ref 0.3–1.2)
Total Protein: 7.1 g/dL (ref 6.5–8.1)

## 2018-11-10 LAB — CBC WITH DIFFERENTIAL/PLATELET
Abs Immature Granulocytes: 0.01 10*3/uL (ref 0.00–0.07)
Basophils Absolute: 0 10*3/uL (ref 0.0–0.1)
Basophils Relative: 1 %
Eosinophils Absolute: 0.4 10*3/uL (ref 0.0–0.5)
Eosinophils Relative: 6 %
HCT: 35.2 % — ABNORMAL LOW (ref 36.0–46.0)
Hemoglobin: 11.4 g/dL — ABNORMAL LOW (ref 12.0–15.0)
Immature Granulocytes: 0 %
Lymphocytes Relative: 9 %
Lymphs Abs: 0.6 10*3/uL — ABNORMAL LOW (ref 0.7–4.0)
MCH: 28.1 pg (ref 26.0–34.0)
MCHC: 32.4 g/dL (ref 30.0–36.0)
MCV: 86.7 fL (ref 80.0–100.0)
Monocytes Absolute: 0.8 10*3/uL (ref 0.1–1.0)
Monocytes Relative: 12 %
Neutro Abs: 4.6 10*3/uL (ref 1.7–7.7)
Neutrophils Relative %: 72 %
Platelets: 223 10*3/uL (ref 150–400)
RBC: 4.06 MIL/uL (ref 3.87–5.11)
RDW: 14.1 % (ref 11.5–15.5)
WBC: 6.3 10*3/uL (ref 4.0–10.5)
nRBC: 0 % (ref 0.0–0.2)

## 2018-11-10 MED ORDER — POTASSIUM CHLORIDE CRYS ER 20 MEQ PO TBCR: EXTENDED_RELEASE_TABLET | ORAL | 3 refills | Status: DC

## 2018-11-10 MED ORDER — OCTREOTIDE ACETATE 30 MG IM KIT
30.0000 mg | PACK | Freq: Once | INTRAMUSCULAR | Status: AC
  Administered 2018-11-10: 15:00:00 30 mg via INTRAMUSCULAR
  Filled 2018-11-10: qty 1

## 2018-11-10 NOTE — Assessment & Plan Note (Addendum)
#   Non- functional Small bowel/mesenteric carcinoid; STAGE IV-on octreotide injections monthly' gallium PET scan January /June 2020 CT-stable/no evidence of obvious progression.  Stable  # Continue Sandostatin on a monthly basis.   # Iron deficient anemia-question AV malformation.  Hemoglobin 11.2-overall stable.  # Rectal bleeding-/ but Hb- stable; 11.2; status post banding with GI.  Patient evaluated by GI; continue hemorrhoidal treatment as recommended.  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  Awaiting family/ patient cooperation with paperwork for co-pay assistance program  # Left breast cancer stage I ER PR positive; on surveillance.  no clinical evidence of recurrence. mammo- on July 2020 normal.    # Hypokemia-3.0.  Recommend taking 20 of KCl; new prescription sent.  I spoke at length with the patient's family/[name]- regarding the patient's clinical status/plan of care.  Family agreement.   # DISPOSITION;  # Sandostatin today; NO venofer # Follow up in 1 month; MD cbc/cmp/-/Sandstatin Dr.B

## 2018-11-10 NOTE — Progress Notes (Signed)
[place] OFFICE PROGRESS NOTE  [name], [name] [name], [name] [name], [name] [name], [name]  Cancer Staging No matching staging information was found for the patient.   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   # FEB 2018- Ga PET- uptake in liver/ mesenteric mass; NOV 2019- increase sandostatin to 93m q monthly.   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  Spoke to patient's daughter [name] the phone.  [name].  female pleasant patient above history of metastatic carcinoid tumor currently on Sandostatin and also history of early stage breast cancer  ER PR positive on Arimidex is here for follow-up.  As per the patient's daughter, continues to have intermittent rectal bleeding.  Continues to have loose stools [? 5-6].  No flushing.  Patient intermittent abdominal pain/cramping currently resolved.  Patient is recommended hyoscyamine suppositories.  Review of Systems  Constitutional: Positive for malaise/fatigue. Negative for chills, diaphoresis, fever and weight loss.  HENT: Negative for nosebleeds and sore throat.   Eyes: Negative for double vision.  Respiratory: Negative for hemoptysis, sputum production, shortness of breath and wheezing.   Cardiovascular: Positive for leg swelling. Negative for chest pain, palpitations and orthopnea.  Gastrointestinal: Positive for blood in stool and diarrhea. Negative for constipation, heartburn, melena, nausea and vomiting.  Genitourinary: Negative for dysuria, frequency and urgency.  Musculoskeletal: Negative for back pain and joint pain.  Skin: Negative.  Negative for itching and rash.  Neurological: Negative for dizziness, tingling, focal weakness, weakness and headaches.  Endo/Heme/Allergies: Does not bruise/bleed easily.  Psychiatric/Behavioral: Negative for depression. The patient is not nervous/anxious and does not have insomnia.       PAST MEDICAL HISTORY :  Past Medical History:  Diagnosis Date  . Aromatase inhibitor use   . [place]   left breast cancer/radiation  . [place] breast [place]  . CHF (congestive heart failure) (HCC)    recent sepsis  . Chronic kidney disease   . Dizziness   . Dyspnea    recently while admitted  . GERD (gastroesophageal reflux disease)   . Hearing loss   . History of hiatal hernia   . History of kidney stones   . HOH (hard  of hearing)    severe  . Hypertension   . Hypothyroidism   . Leg DVT (deep venous thromboembolism), acute ([place]) 07/2015   left leg after fracture  . 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 Pain     chest pain while admitted  . Personal history of radiation therapy 2015   left breast ca  . Thyroid disease     PAST SURGICAL HISTORY :   Past Surgical History:  Procedure Laterality Date  . ABDOMINAL HYSTERECTOMY     partial  . BREAST LUMPECTOMY Left 01/13/2013   invasive mammary carcinoma, clear margins, LN negative  . BREAST LUMPECTOMY WITH SENTINEL LYMPH NODE BIOPSY Left 2014  . CHOLECYSTECTOMY    . COLONOSCOPY WITH PROPOFOL N/A 09/17/2017   Procedure: COLONOSCOPY WITH PROPOFOL;  Surgeon: Jonathon Bellows, MD;  Location: [facility] ENDOSCOPY;  Service: Gastroenterology;  Laterality: N/A;  . CYSTOSCOPY W/ URETERAL STENT PLACEMENT Right 01/07/2016   Procedure: CYSTOSCOPY WITH STENT REPLACEMENT;  Surgeon: Hollice Espy, MD;  Location: [facility] ORS;  Service: Urology;  Laterality: Right;  . CYSTOSCOPY WITH RETROGRADE PYELOGRAM, URETEROSCOPY AND STENT PLACEMENT Right 12/19/2015   Procedure: CYSTOSCOPY WITH RETROGRADE PYELOGRAM, URETEROSCOPY AND STENT PLACEMENT;  Surgeon: Ardis Hughs, MD;  Location: [facility] ORS;  Service: Urology;  Laterality: Right;  . URETEROSCOPY WITH HOLMIUM LASER LITHOTRIPSY Right 01/07/2016   Procedure: URETEROSCOPY WITH HOLMIUM LASER LITHOTRIPSY;  Surgeon: Hollice Espy, MD;  Location: [facility] ORS;  Service: Urology;  Laterality: Right;    FAMILY HISTORY :   Family History  Problem Relation Age of Onset  . Breast cancer Mother 76       deceased 69  . Breast cancer Maternal Aunt 68       deceased 27s  . Prostate cancer Father        deceased 13  . Colon cancer Paternal Aunt 80       deceased 44s    SOCIAL HISTORY:   Social History   Tobacco Use  . Smoking status: Never Smoker  . Smokeless tobacco: Never Used  Substance Use Topics  . Alcohol use: No    Alcohol/week: 0.0 standard drinks  . Drug use: No    ALLERGIES:  is allergic to sulfa antibiotics.  MEDICATIONS:  Current Outpatient Medications  Medication Sig Dispense Refill  . anastrozole (ARIMIDEX) 1  MG tablet TAKE 1 TABLET BY MOUTH EVERY DAY 90 tablet 5  . Calcium Carb-Cholecalciferol (CALCIUM 600 + D PO) Take 1 tablet by mouth daily.    . furosemide (LASIX) 40 MG tablet Take 40 mg by mouth daily as needed for fluid or edema.     . hyoscyamine (ANASPAZ) 0.125 MG TBDP disintergrating tablet Place 0.25 mg under the tongue every 4 (four) hours as needed for cramping.    . labetalol (NORMODYNE) 200 MG tablet Take 200 mg by mouth 2 (two) times daily.     Marland Kitchen levothyroxine (SYNTHROID) 88 MCG tablet Take 88 mcg by mouth daily before breakfast.     . losartan-hydrochlorothiazide (HYZAAR) 100-25 MG tablet Take 1 tablet by mouth daily.    Marland Kitchen nystatin (MYCOSTATIN/NYSTOP) powder APPLY TO AFFECTED AREA TWICE A DAY 15 g 3  . octreotide (SANDOSTATIN LAR) 30 MG injection Inject 30 mg into the muscle every 28 (twenty-eight) days.     . Vitamin D, Ergocalciferol, (DRISDOL) 50000 units CAPS capsule TAKE 1 CAPSULE (50,000 UNITS TOTAL) BY MOUTH ONCE A WEEK. 24 capsule 1  . alendronate (FOSAMAX) 70 MG tablet Take 70 mg by mouth once a week.  3  . potassium chloride SA (KLOR-CON) 20 MEQ tablet 1 pill  A day. 60 tablet 3  . Telotristat Ethyl,as Etiprate, (XERMELO) 250 MG TABS Take 250 mg by mouth 2 (two) times a day. (Patient not taking: Reported on 11/09/2018) 60 tablet 4   No current facility-administered medications for this visit.     PHYSICAL EXAMINATION: ECOG PERFORMANCE STATUS: 1 - Symptomatic but completely ambulatory  BP (!) 167/71 (BP Location: Right Arm, Patient Position: Sitting)   Pulse (!) 58   Temp (!) 97.3 F (36.3 C) (Temporal)   Resp 18   Wt 220 lb 12.8 oz (100.2 kg)   BMI 39.11 kg/m   Filed Weights   11/09/18 1501  Weight: 220 lb 12.8 oz (100.2 kg)    Physical Exam  Constitutional: She is oriented to person, place, and time and well-developed, well-nourished, and in no distress.  Patient is a wheelchair.  She is alone.  Patient is hard of hearing.  HENT:  Head: Normocephalic and  atraumatic.  Mouth/Throat: Oropharynx is clear and moist. No oropharyngeal exudate.  Eyes: Pupils are equal, round, and reactive to light.  Neck: Normal range of motion. Neck supple.  Cardiovascular: Normal rate and regular rhythm.  Pulmonary/Chest: No respiratory distress. She has no wheezes.  Decreased breath sounds in the right upper chest.   Abdominal: Soft. Bowel sounds are normal. She exhibits no distension and no mass. There is no abdominal tenderness. There is no rebound and no guarding.  Musculoskeletal: Normal range of motion.        General: No tenderness.  Neurological: She is alert and oriented to person, place, and time.  Skin: Skin is warm.  Psychiatric: Affect normal.    LABORATORY DATA:  I have reviewed the data as listed    Component Value Date/Time   NA 139 11/10/2018 1304   NA 137 07/12/2013 0841   K 3.0 (L) 11/10/2018 1304   K 4.7 07/12/2013 0841   CL 103 11/10/2018 1304   CL 107 07/12/2013 0841   CO2 28 11/10/2018 1304   CO2 20 (L) 07/12/2013 0841   GLUCOSE 126 (H) 11/10/2018 1304   GLUCOSE 122 (H) 07/12/2013 0841   BUN 17 11/10/2018 1304   BUN 18 07/12/2013 0841   CREATININE 0.75 11/10/2018 1304   CREATININE 0.64 05/22/2014 1111   CALCIUM 9.3 11/10/2018 1304   CALCIUM 9.2 07/12/2013 0841   PROT 7.1 11/10/2018 1304   PROT 7.0 05/22/2014 1111   ALBUMIN 3.9 11/10/2018 1304   ALBUMIN 4.2 05/22/2014 1111   AST 23 11/10/2018 1304   AST 29 05/22/2014 1111   ALT 15 11/10/2018 1304   ALT 17 05/22/2014 1111   ALKPHOS 80 11/10/2018 1304   ALKPHOS 95 05/22/2014 1111   BILITOT 0.7 11/10/2018 1304   BILITOT 0.8 05/22/2014 1111   GFRNONAA >60 11/10/2018 1304   GFRNONAA >60 05/22/2014 1111   GFRAA >60 11/10/2018 1304   GFRAA >60 05/22/2014 1111    No results found for: SPEP, UPEP  Lab Results  Component Value Date   WBC 6.3 11/10/2018   NEUTROABS 4.6 11/10/2018   HGB 11.4 (L) 11/10/2018   HCT 35.2 (L) 11/10/2018   MCV 86.7 11/10/2018   PLT 223  11/10/2018      Chemistry      Component Value Date/Time   NA 139 11/10/2018 1304   NA 137 07/12/2013 0841   K 3.0 (L) 11/10/2018 1304   K 4.7 07/12/2013 0841   CL 103 11/10/2018 1304  CL 107 07/12/2013 0841   CO2 28 11/10/2018 1304   CO2 20 (L) 07/12/2013 0841   BUN 17 11/10/2018 1304   BUN 18 07/12/2013 0841   CREATININE 0.75 11/10/2018 1304   CREATININE 0.64 05/22/2014 1111      Component Value Date/Time   CALCIUM 9.3 11/10/2018 1304   CALCIUM 9.2 07/12/2013 0841   ALKPHOS 80 11/10/2018 1304   ALKPHOS 95 05/22/2014 1111   AST 23 11/10/2018 1304   AST 29 05/22/2014 1111   ALT 15 11/10/2018 1304   ALT 17 05/22/2014 1111   BILITOT 0.7 11/10/2018 1304   BILITOT 0.8 05/22/2014 1111       RADIOGRAPHIC STUDIES: I have personally reviewed the radiological images as listed and agreed with the findings in the report. No results found.   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  Stable  # Continue Sandostatin on a monthly basis.   # Iron deficient anemia-question AV malformation.  Hemoglobin 11.2-overall stable.  # Rectal bleeding-/ but Hb- stable; 11.2; status post banding with GI.  Patient evaluated by GI; continue hemorrhoidal treatment as recommended.  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  Awaiting family/ patient cooperation with paperwork for co-pay assistance program  # Left breast cancer stage I ER PR positive; on surveillance.  no clinical evidence of recurrence. mammo- on July 2020 normal.    # Hypokemia-3.0.  Recommend taking 20 of KCl; new prescription sent.  I spoke at length with the patient's family/[name]- regarding the patient's clinical status/plan of care.  Family agreement.   # DISPOSITION;  # Sandostatin  today; NO venofer # Follow up in 1 month; MD cbc/cmp/-/Sandstatin Dr.B   Orders Placed This Encounter  Procedures  . CBC with Differential    Standing Status:   Future    Standing Expiration Date:   11/10/2019  . Comprehensive metabolic panel    Standing Status:   Future    Standing Expiration Date:   11/10/2019   All questions were answered. The patient knows to call the clinic with any problems, questions or concerns.      [name], MD 11/10/2018 4:04 PM

## 2018-11-12 LAB — CHROMOGRANIN A: Chromogranin A (ng/mL): 102.5 ng/mL — ABNORMAL HIGH (ref 0.0–101.8)

## 2018-11-16 ENCOUNTER — Ambulatory Visit
Admission: RE | Admit: 2018-11-16 | Discharge: 2018-11-16 | Disposition: A | Discharge status: Home / Self Care | Payer: Medicare Other | Source: Ambulatory Visit | Attending: Neurology | Admitting: Neurology

## 2018-11-16 ENCOUNTER — Other Ambulatory Visit: Payer: Self-pay

## 2018-11-16 DIAGNOSIS — G43119 Migraine with aura, intractable, without status migrainosus: Secondary | ICD-10-CM | POA: Diagnosis present

## 2018-12-07 ENCOUNTER — Telehealth: Payer: Self-pay | Admitting: *Deleted

## 2018-12-07 NOTE — Telephone Encounter (Signed)
Daughter spoke with Cecille Rubin, RN and would like to r/s her mom's apts tom.. I personally attempted to reach the patient's daughter. I left a vm on home line. Unable to reach daughter on cell- "call can not be completed at this time."  Daughter returned my phone call. - apts r/s per her request

## 2018-12-08 ENCOUNTER — Inpatient Hospital Stay: Payer: Medicare Other

## 2018-12-08 ENCOUNTER — Inpatient Hospital Stay: Payer: Medicare Other | Admitting: Internal Medicine

## 2018-12-14 ENCOUNTER — Other Ambulatory Visit: Payer: Self-pay | Admitting: Internal Medicine

## 2018-12-14 DIAGNOSIS — C7A019 Malignant carcinoid tumor of the small intestine, unspecified portion: Secondary | ICD-10-CM

## 2018-12-14 DIAGNOSIS — Z79811 Long term (current) use of aromatase inhibitors: Secondary | ICD-10-CM

## 2018-12-15 ENCOUNTER — Other Ambulatory Visit: Payer: Self-pay

## 2018-12-15 NOTE — Progress Notes (Signed)
Patient pre screened for office appointment, no questions or concerns today. Patient reminded of upcoming appointment time and date. Patient can not hear on the phone daughter supplied information.

## 2018-12-16 ENCOUNTER — Other Ambulatory Visit: Payer: Self-pay

## 2018-12-16 ENCOUNTER — Encounter: Payer: Self-pay | Admitting: Internal Medicine

## 2018-12-16 ENCOUNTER — Inpatient Hospital Stay: Payer: Medicare Other

## 2018-12-16 ENCOUNTER — Inpatient Hospital Stay (HOSPITAL_BASED_OUTPATIENT_CLINIC_OR_DEPARTMENT_OTHER): Payer: Medicare Other | Admitting: Internal Medicine

## 2018-12-16 ENCOUNTER — Inpatient Hospital Stay: Payer: Medicare Other | Attending: Internal Medicine

## 2018-12-16 DIAGNOSIS — Z8042 Family history of malignant neoplasm of prostate: Secondary | ICD-10-CM | POA: Insufficient documentation

## 2018-12-16 DIAGNOSIS — Z79811 Long term (current) use of aromatase inhibitors: Secondary | ICD-10-CM | POA: Diagnosis not present

## 2018-12-16 DIAGNOSIS — E039 Hypothyroidism, unspecified: Secondary | ICD-10-CM | POA: Insufficient documentation

## 2018-12-16 DIAGNOSIS — I10 Essential (primary) hypertension: Secondary | ICD-10-CM | POA: Diagnosis not present

## 2018-12-16 DIAGNOSIS — C50912 Malignant neoplasm of unspecified site of left female breast: Secondary | ICD-10-CM | POA: Insufficient documentation

## 2018-12-16 DIAGNOSIS — Z800 Family history of malignant neoplasm of digestive organs: Secondary | ICD-10-CM | POA: Insufficient documentation

## 2018-12-16 DIAGNOSIS — Z86718 Personal history of other venous thrombosis and embolism: Secondary | ICD-10-CM | POA: Insufficient documentation

## 2018-12-16 DIAGNOSIS — Z803 Family history of malignant neoplasm of breast: Secondary | ICD-10-CM | POA: Diagnosis not present

## 2018-12-16 DIAGNOSIS — R197 Diarrhea, unspecified: Secondary | ICD-10-CM | POA: Diagnosis not present

## 2018-12-16 DIAGNOSIS — C7B02 Secondary carcinoid tumors of liver: Secondary | ICD-10-CM

## 2018-12-16 DIAGNOSIS — E876 Hypokalemia: Secondary | ICD-10-CM | POA: Diagnosis not present

## 2018-12-16 DIAGNOSIS — C7A019 Malignant carcinoid tumor of the small intestine, unspecified portion: Secondary | ICD-10-CM | POA: Insufficient documentation

## 2018-12-16 DIAGNOSIS — N189 Chronic kidney disease, unspecified: Secondary | ICD-10-CM | POA: Diagnosis not present

## 2018-12-16 DIAGNOSIS — D509 Iron deficiency anemia, unspecified: Secondary | ICD-10-CM | POA: Insufficient documentation

## 2018-12-16 DIAGNOSIS — Z90710 Acquired absence of both cervix and uterus: Secondary | ICD-10-CM | POA: Diagnosis not present

## 2018-12-16 DIAGNOSIS — K6389 Other specified diseases of intestine: Secondary | ICD-10-CM

## 2018-12-16 DIAGNOSIS — Z170 Estrogen receptor positive status [ER+]: Secondary | ICD-10-CM | POA: Insufficient documentation

## 2018-12-16 DIAGNOSIS — Z79899 Other long term (current) drug therapy: Secondary | ICD-10-CM | POA: Insufficient documentation

## 2018-12-16 LAB — CBC WITH DIFFERENTIAL/PLATELET
Abs Immature Granulocytes: 0.02 10*3/uL (ref 0.00–0.07)
Basophils Absolute: 0 10*3/uL (ref 0.0–0.1)
Basophils Relative: 1 %
Eosinophils Absolute: 0.3 10*3/uL (ref 0.0–0.5)
Eosinophils Relative: 5 %
HCT: 35.7 % — ABNORMAL LOW (ref 36.0–46.0)
Hemoglobin: 11.4 g/dL — ABNORMAL LOW (ref 12.0–15.0)
Immature Granulocytes: 0 %
Lymphocytes Relative: 10 %
Lymphs Abs: 0.6 10*3/uL — ABNORMAL LOW (ref 0.7–4.0)
MCH: 27.7 pg (ref 26.0–34.0)
MCHC: 31.9 g/dL (ref 30.0–36.0)
MCV: 86.9 fL (ref 80.0–100.0)
Monocytes Absolute: 0.7 10*3/uL (ref 0.1–1.0)
Monocytes Relative: 13 %
Neutro Abs: 4 10*3/uL (ref 1.7–7.7)
Neutrophils Relative %: 71 %
Platelets: 188 10*3/uL (ref 150–400)
RBC: 4.11 MIL/uL (ref 3.87–5.11)
RDW: 14.5 % (ref 11.5–15.5)
WBC: 5.6 10*3/uL (ref 4.0–10.5)
nRBC: 0 % (ref 0.0–0.2)

## 2018-12-16 LAB — COMPREHENSIVE METABOLIC PANEL
ALT: 16 U/L (ref 0–44)
AST: 25 U/L (ref 15–41)
Albumin: 3.9 g/dL (ref 3.5–5.0)
Alkaline Phosphatase: 78 U/L (ref 38–126)
Anion gap: 6 (ref 5–15)
BUN: 15 mg/dL (ref 8–23)
CO2: 24 mmol/L (ref 22–32)
Calcium: 8.9 mg/dL (ref 8.9–10.3)
Chloride: 105 mmol/L (ref 98–111)
Creatinine, Ser: 0.7 mg/dL (ref 0.44–1.00)
GFR calc Af Amer: >60 mL/min (ref >60)
GFR calc non Af Amer: >60 mL/min (ref >60)
Glucose, Bld: 133 mg/dL — ABNORMAL HIGH (ref 70–99)
Potassium: 3.3 mmol/L — ABNORMAL LOW (ref 3.5–5.1)
Sodium: 135 mmol/L (ref 135–145)
Total Bilirubin: 1 mg/dL (ref 0.3–1.2)
Total Protein: 6.7 g/dL (ref 6.5–8.1)

## 2018-12-16 MED ORDER — OCTREOTIDE ACETATE 30 MG IM KIT
30.0000 mg | PACK | Freq: Once | INTRAMUSCULAR | Status: AC
  Administered 2018-12-16: 30 mg via INTRAMUSCULAR
  Filled 2018-12-16: qty 1

## 2018-12-16 NOTE — Progress Notes (Signed)
[facility] OFFICE PROGRESS NOTE  [name], [name] [name], [name] [name], [name] [name], [name]  Cancer Staging No matching staging information was found for the patient.   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   # FEB 2018- Ga PET- uptake in liver/ mesenteric mass; NOV 2019- increase sandostatin to 33m q monthly.   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  As per patient daughter unavailable over the phone today.  [name] pleasant patient above history of metastatic carcinoid tumor currently on Sandostatin is here for follow-up.  Patient  states her blood in stools is improved.  Has chronic mild diarrhea.  Abdominal pain/cramping improved.  Denies any flushing.  Review of Systems  Constitutional: Positive for malaise/fatigue. Negative for chills, diaphoresis, fever and weight loss.  HENT: Negative for nosebleeds and sore throat.   Eyes: Negative for double vision.  Respiratory: Negative for hemoptysis, sputum production, shortness of breath and wheezing.   Cardiovascular: Positive for leg swelling. Negative for chest pain, palpitations and orthopnea.  Gastrointestinal: Positive for diarrhea. Negative for constipation, heartburn, melena, nausea and vomiting.  Genitourinary: Negative for dysuria, frequency and urgency.  Musculoskeletal: Negative for back pain and joint pain.  Skin: Negative.  Negative for itching and rash.  Neurological: Negative for dizziness, tingling, focal weakness, weakness and headaches.  Endo/Heme/Allergies: Does not bruise/bleed easily.  Psychiatric/Behavioral: Negative for depression. The patient is not nervous/anxious and does not have insomnia.       PAST MEDICAL HISTORY :  Past Medical History:  Diagnosis Date  . Aromatase inhibitor use   . Breast [place] [place]   left breast cancer/radiation  . [place] [place]  . CHF (congestive heart failure) (HCC)    recent sepsis  . Chronic kidney disease   . Dizziness   . Dyspnea    recently while admitted  . GERD (gastroesophageal reflux disease)   . Hearing loss   . History of hiatal hernia   . History of kidney stones   . HOH (hard of hearing)    severe  . Hypertension   . Hypothyroidism   . Leg DVT (deep venous thromboembolism), [place] ([facility] 07[place]   left leg after fracture  .  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 Pain    chest pain while admitted  . Personal history of radiation therapy 2015   left breast ca  . Thyroid disease     PAST SURGICAL HISTORY :   Past Surgical History:  Procedure Laterality Date   . ABDOMINAL HYSTERECTOMY     partial  . BREAST LUMPECTOMY Left 01/13/2013   invasive mammary carcinoma, clear margins, LN negative  . BREAST LUMPECTOMY WITH SENTINEL LYMPH NODE BIOPSY Left 2014  . CHOLECYSTECTOMY    . COLONOSCOPY WITH PROPOFOL N/A 09/17/2017   Procedure: COLONOSCOPY WITH PROPOFOL;  Surgeon: Jonathon Bellows, MD;  Location[facility] ENDOSCOPY;  Service: Gastroenterology;  Laterality: N/A;  . CYSTOSCOPY W/ URETERAL STENT PLACEMENT Right 01/07/2016   Procedure: CYSTOSCOPY WITH STENT REPLACEMENT;  Surgeon: Hollice Espy, MD;  Location: [facility] ORS;  Service: Urology;  Laterality: Right;  . CYSTOSCOPY WITH RETROGRADE PYELOGRAM, URETEROSCOPY AND STENT PLACEMENT Right 12/19/2015   Procedure: CYSTOSCOPY WITH RETROGRADE PYELOGRAM, URETEROSCOPY AND STENT PLACEMENT;  Surgeon: Ardis Hughs, MD;  Location: [facility] ORS;  Service: Urology;  Laterality: Right;  . URETEROSCOPY WITH HOLMIUM LASER LITHOTRIPSY Right 01/07/2016   Procedure: URETEROSCOPY WITH HOLMIUM LASER LITHOTRIPSY;  Surgeon: Hollice Espy, MD;  Location: [facility] ORS;  Service: Urology;  Laterality: Right;    FAMILY HISTORY :   Family History  Problem Relation Age of Onset  . Breast cancer Mother 73       deceased 60  . Breast cancer Maternal Aunt 68       deceased 60s  . Prostate cancer Father        deceased 61  . Colon cancer Paternal Aunt 80       deceased 50s    SOCIAL HISTORY:   Social History   Tobacco Use  . Smoking status: Never Smoker  . Smokeless tobacco: Never Used  Substance Use Topics  . Alcohol use: No    Alcohol/week: 0.0 standard drinks  . Drug use: No    ALLERGIES:  is allergic to sulfa antibiotics.  MEDICATIONS:  Current Outpatient Medications  Medication Sig Dispense Refill  . anastrozole (ARIMIDEX) 1 MG tablet TAKE 1 TABLET BY MOUTH EVERY DAY 90 tablet 5  . Calcium Carb-Cholecalciferol (CALCIUM 600 + D PO) Take 1 tablet by mouth daily.    . furosemide (LASIX) 40 MG tablet Take 40 mg by mouth  daily as needed for fluid or edema.     . hyoscyamine (ANASPAZ) 0.125 MG TBDP disintergrating tablet Place 0.25 mg under the tongue every 4 (four) hours as needed for cramping.    . labetalol (NORMODYNE) 200 MG tablet Take 200 mg by mouth 2 (two) times daily.     Marland Kitchen levothyroxine (SYNTHROID) 88 MCG tablet Take 88 mcg by mouth daily before breakfast.     . losartan-hydrochlorothiazide (HYZAAR) 100-25 MG tablet Take 1 tablet by mouth daily.    Marland Kitchen nystatin (MYCOSTATIN/NYSTOP) powder APPLY TO AFFECTED AREA TWICE A DAY 15 g 3  . octreotide (SANDOSTATIN LAR) 30 MG injection Inject 30 mg into the muscle every 28 (twenty-eight) days.     . potassium chloride SA (KLOR-CON) 20 MEQ tablet 1 pill  A day. 60 tablet 3  . Telotristat Ethyl,as Etiprate, (XERMELO) 250 MG TABS Take 250 mg by mouth 2 (two) times a day. 60 tablet 4  . Vitamin D, Ergocalciferol, (DRISDOL) 1.25 MG (50000 UT) CAPS capsule TAKE 1 CAPSULE (50,000 UNITS TOTAL) BY MOUTH ONCE A WEEK. 12 capsule 3  . alendronate (FOSAMAX) 70 MG  tablet Take 70 mg by mouth once a week.  3   No current facility-administered medications for this visit.     PHYSICAL EXAMINATION: ECOG PERFORMANCE STATUS: 1 - Symptomatic but completely ambulatory  BP (!) 143/59 (BP Location: Right Arm, Patient Position: Sitting, Cuff Size: Large)   Pulse (!) 55   Temp (!) 96.6 F (35.9 C) (Tympanic)   Wt 220 lb (99.8 kg)   BMI 38.97 kg/m   Filed Weights   12/16/18 1014  Weight: 220 lb (99.8 kg)    Physical Exam  Constitutional: She is oriented to person, place, and time and well-developed, well-nourished, and in no distress.  Patient is a wheelchair.  She is alone.  Patient is hard of hearing.  HENT:  Head: Normocephalic and atraumatic.  Mouth/Throat: Oropharynx is clear and moist. No oropharyngeal exudate.  Eyes: Pupils are equal, round, and reactive to light.  Neck: Normal range of motion. Neck supple.  Cardiovascular: Normal rate and regular rhythm.   Pulmonary/Chest: No respiratory distress. She has no wheezes.  Decreased breath sounds in the right upper chest.   Abdominal: Soft. Bowel sounds are normal. She exhibits no distension and no mass. There is no abdominal tenderness. There is no rebound and no guarding.  Musculoskeletal: Normal range of motion.        General: No tenderness.  Neurological: She is alert and oriented to person, place, and time.  Skin: Skin is warm.  Psychiatric: Affect normal.    LABORATORY DATA:  I have reviewed the data as listed    Component Value Date/Time   NA 135 12/16/2018 0958   NA 137 07/12/2013 0841   K 3.3 (L) 12/16/2018 0958   K 4.7 07/12/2013 0841   CL 105 12/16/2018 0958   CL 107 07/12/2013 0841   CO2 24 12/16/2018 0958   CO2 20 (L) 07/12/2013 0841   GLUCOSE 133 (H) 12/16/2018 0958   GLUCOSE 122 (H) 07/12/2013 0841   BUN 15 12/16/2018 0958   BUN 18 07/12/2013 0841   CREATININE 0.70 12/16/2018 0958   CREATININE 0.64 05/22/2014 1111   CALCIUM 8.9 12/16/2018 0958   CALCIUM 9.2 07/12/2013 0841   PROT 6.7 12/16/2018 0958   PROT 7.0 05/22/2014 1111   ALBUMIN 3.9 12/16/2018 0958   ALBUMIN 4.2 05/22/2014 1111   AST 25 12/16/2018 0958   AST 29 05/22/2014 1111   ALT 16 12/16/2018 0958   ALT 17 05/22/2014 1111   ALKPHOS 78 12/16/2018 0958   ALKPHOS 95 05/22/2014 1111   BILITOT 1.0 12/16/2018 0958   BILITOT 0.8 05/22/2014 1111   GFRNONAA >60 12/16/2018 0958   GFRNONAA >60 05/22/2014 1111   GFRAA >60 12/16/2018 0958   GFRAA >60 05/22/2014 1111    No results found for: SPEP, UPEP  Lab Results  Component Value Date   WBC 5.6 12/16/2018   NEUTROABS 4.0 12/16/2018   HGB 11.4 (L) 12/16/2018   HCT 35.7 (L) 12/16/2018   MCV 86.9 12/16/2018   PLT 188 12/16/2018      Chemistry      Component Value Date/Time   NA 135 12/16/2018 0958   NA 137 07/12/2013 0841   K 3.3 (L) 12/16/2018 0958   K 4.7 07/12/2013 0841   CL 105 12/16/2018 0958   CL 107 07/12/2013 0841   CO2 24 12/16/2018  0958   CO2 20 (L) 07/12/2013 0841   BUN 15 12/16/2018 0958   BUN 18 07/12/2013 0841   CREATININE 0.70 12/16/2018 0958   CREATININE  0.64 05/22/2014 1111      Component Value Date/Time   CALCIUM 8.9 12/16/2018 0958   CALCIUM 9.2 07/12/2013 0841   ALKPHOS 78 12/16/2018 0958   ALKPHOS 95 05/22/2014 1111   AST 25 12/16/2018 0958   AST 29 05/22/2014 1111   ALT 16 12/16/2018 0958   ALT 17 05/22/2014 1111   BILITOT 1.0 12/16/2018 0958   BILITOT 0.8 05/22/2014 1111       RADIOGRAPHIC STUDIES: I have personally reviewed the radiological images as listed and agreed with the findings in the report. No results found.   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 Stable.   # Continue Sandostatin on a monthly basis.   # Iron deficient anemia-question AV malformation.  Hemoglobin 11.2-overall stable.  Hold off IV iron.  # Rectal bleeding/? hemorrhoidal -Improved; see above.   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  Stable.  Awaiting family/ patient cooperation with paperwork for co-pay assistance program  # Left breast cancer stage I ER PR positive; on surveillance.  no clinical evidence of recurrence. mammo- on July 2020 normal.  Stable  # Hypokemia-3.3.  Continue  20 of KCl for now.  Stable  # pt states daughter, [name] at work today; I asked the patient to have her daughter call us if any questions.   # DISPOSITION;  # Sandostatin today;  # Follow up in 1 month; MD cbc/cmp/Sandstatin Dr.B   Orders Placed This Encounter  Procedures  . CBC with Differential    Standing Status:   Future    Standing Expiration Date:   12/16/2019  . Comprehensive metabolic panel    Standing Status:   Future    Standing Expiration Date:   12/16/2019   All  questions were answered. The patient knows to call the clinic with any problems, questions or concerns.      [name], MD 12/17/2018 7:13 AM

## 2018-12-16 NOTE — Assessment & Plan Note (Addendum)
#   Non- functional Small bowel/mesenteric carcinoid; STAGE IV-on octreotide injections monthly' gallium PET scan January /June 2020 CT-stable/no evidence of obvious progression. Stable.   # Continue Sandostatin on a monthly basis.   # Iron deficient anemia-question AV malformation.  Hemoglobin 11.2-overall stable.  Hold off IV iron.  # Rectal bleeding/? hemorrhoidal -Improved; see above.   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  Stable.  Awaiting family/ patient cooperation with paperwork for co-pay assistance program  # Left breast cancer stage I ER PR positive; on surveillance.  no clinical evidence of recurrence. mammo- on July 2020 normal.  Stable  # Hypokemia-3.3.  Continue  20 of KCl for now.  Stable  # pt states daughter, [name] at work today; I asked the patient to have her daughter call us if any questions.   # DISPOSITION;  # Sandostatin today;  # Follow up in 1 month; MD cbc/cmp/Sandstatin Dr.B

## 2019-01-13 ENCOUNTER — Other Ambulatory Visit: Payer: Self-pay

## 2019-01-13 ENCOUNTER — Other Ambulatory Visit: Payer: Self-pay | Admitting: *Deleted

## 2019-01-13 ENCOUNTER — Inpatient Hospital Stay: Payer: Medicare Other

## 2019-01-13 ENCOUNTER — Inpatient Hospital Stay: Payer: Medicare Other | Attending: Internal Medicine

## 2019-01-13 ENCOUNTER — Inpatient Hospital Stay (HOSPITAL_BASED_OUTPATIENT_CLINIC_OR_DEPARTMENT_OTHER): Payer: Medicare Other | Admitting: Internal Medicine

## 2019-01-13 VITALS — BP 144/59 | HR 53 | Temp 97.0°F | Wt 222.0 lb

## 2019-01-13 DIAGNOSIS — C7B02 Secondary carcinoid tumors of liver: Secondary | ICD-10-CM

## 2019-01-13 DIAGNOSIS — D500 Iron deficiency anemia secondary to blood loss (chronic): Secondary | ICD-10-CM

## 2019-01-13 DIAGNOSIS — E340 Carcinoid syndrome: Secondary | ICD-10-CM | POA: Diagnosis not present

## 2019-01-13 DIAGNOSIS — D509 Iron deficiency anemia, unspecified: Secondary | ICD-10-CM | POA: Diagnosis not present

## 2019-01-13 DIAGNOSIS — E876 Hypokalemia: Secondary | ICD-10-CM | POA: Diagnosis not present

## 2019-01-13 DIAGNOSIS — K6389 Other specified diseases of intestine: Secondary | ICD-10-CM

## 2019-01-13 DIAGNOSIS — C7A019 Malignant carcinoid tumor of the small intestine, unspecified portion: Secondary | ICD-10-CM | POA: Diagnosis present

## 2019-01-13 LAB — IRON AND TIBC
Iron: 39 ug/dL (ref 28–170)
Saturation Ratios: 9 % — ABNORMAL LOW (ref 10.4–31.8)
TIBC: 436 ug/dL (ref 250–450)
UIBC: 397 ug/dL

## 2019-01-13 LAB — CBC WITH DIFFERENTIAL/PLATELET
Abs Immature Granulocytes: 0.01 10*3/uL (ref 0.00–0.07)
Basophils Absolute: 0.1 10*3/uL (ref 0.0–0.1)
Basophils Relative: 1 %
Eosinophils Absolute: 0.3 10*3/uL (ref 0.0–0.5)
Eosinophils Relative: 4 %
HCT: 35.8 % — ABNORMAL LOW (ref 36.0–46.0)
Hemoglobin: 11 g/dL — ABNORMAL LOW (ref 12.0–15.0)
Immature Granulocytes: 0 %
Lymphocytes Relative: 11 %
Lymphs Abs: 0.7 10*3/uL (ref 0.7–4.0)
MCH: 27.1 pg (ref 26.0–34.0)
MCHC: 30.7 g/dL (ref 30.0–36.0)
MCV: 88.2 fL (ref 80.0–100.0)
Monocytes Absolute: 0.9 10*3/uL (ref 0.1–1.0)
Monocytes Relative: 14 %
Neutro Abs: 4.2 10*3/uL (ref 1.7–7.7)
Neutrophils Relative %: 70 %
Platelets: 197 10*3/uL (ref 150–400)
RBC: 4.06 MIL/uL (ref 3.87–5.11)
RDW: 15.1 % (ref 11.5–15.5)
WBC: 6 10*3/uL (ref 4.0–10.5)
nRBC: 0 % (ref 0.0–0.2)

## 2019-01-13 LAB — COMPREHENSIVE METABOLIC PANEL
ALT: 17 U/L (ref 0–44)
AST: 26 U/L (ref 15–41)
Albumin: 3.8 g/dL (ref 3.5–5.0)
Alkaline Phosphatase: 80 U/L (ref 38–126)
Anion gap: 9 (ref 5–15)
BUN: 18 mg/dL (ref 8–23)
CO2: 26 mmol/L (ref 22–32)
Calcium: 9.1 mg/dL (ref 8.9–10.3)
Chloride: 104 mmol/L (ref 98–111)
Creatinine, Ser: 0.78 mg/dL (ref 0.44–1.00)
GFR calc Af Amer: >60 mL/min (ref >60)
GFR calc non Af Amer: >60 mL/min (ref >60)
Glucose, Bld: 124 mg/dL — ABNORMAL HIGH (ref 70–99)
Potassium: 3.8 mmol/L (ref 3.5–5.1)
Sodium: 139 mmol/L (ref 135–145)
Total Bilirubin: 0.8 mg/dL (ref 0.3–1.2)
Total Protein: 6.6 g/dL (ref 6.5–8.1)

## 2019-01-13 LAB — FERRITIN: Ferritin: 9 ng/mL — ABNORMAL LOW (ref 11–307)

## 2019-01-13 MED ORDER — OCTREOTIDE ACETATE 30 MG IM KIT
30.0000 mg | PACK | Freq: Once | INTRAMUSCULAR | Status: AC
  Administered 2019-01-13: 30 mg via INTRAMUSCULAR
  Filled 2019-01-13: qty 1

## 2019-01-13 NOTE — Progress Notes (Signed)
[place] OFFICE PROGRESS NOTE  [name], [name] [name], [name] [name], [name] [name], [name]  Cancer Staging No matching staging information was found for the patient.   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   # FEB 2018- Ga PET- uptake in liver/ mesenteric mass; NOV 2019- increase sandostatin to 90m q monthly.   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  Pt declines calling her daughter.   Emma [id number].  female pleasant patient above history of metastatic carcinoid tumor currently on Sandostatin is here for follow-up.  Patient states her blood in  stools has resolved.  She continues to have mild diarrhea.  Chronic intermittent abdominal left sided pain.  Not getting worse.  Denies any flushing.  Review of Systems  Constitutional: Positive for malaise/fatigue. Negative for chills, diaphoresis, fever and weight loss.  HENT: Negative for nosebleeds and sore throat.   Eyes: Negative for double vision.  Respiratory: Negative for hemoptysis, sputum production, shortness of breath and wheezing.   Cardiovascular: Positive for leg swelling. Negative for chest pain, palpitations and orthopnea.  Gastrointestinal: Positive for diarrhea. Negative for constipation, heartburn, melena, nausea and vomiting.  Genitourinary: Negative for dysuria, frequency and urgency.  Musculoskeletal: Negative for back pain and joint pain.  Skin: Negative.  Negative for itching and rash.  Neurological: Negative for dizziness, tingling, focal weakness, weakness and headaches.  Endo/Heme/Allergies: Does not bruise/bleed easily.  Psychiatric/Behavioral: Negative for depression. The patient is not nervous/anxious and does not have insomnia.       PAST MEDICAL HISTORY :  Past Medical History:  Diagnosis Date  . Aromatase inhibitor use   . Breast cancer ([place] 2014   left breast cancer/radiation  . Breast cancer, left breast ([facility] 06/19/2014  . CHF (congestive heart failure) (HCC)    recent sepsis  . Chronic kidney disease   . Dizziness   . Dyspnea    recently while admitted  . GERD (gastroesophageal reflux disease)   . Hearing loss   . History of hiatal hernia   . History of kidney stones   . HOH (hard of hearing)    severe  . Hypertension   . Hypothyroidism   . Leg DVT (deep venous thromboembolism), acute ([place] 07/2015  left leg after fracture  . 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 Pain    chest pain while admitted  . Personal history of radiation therapy 2015   left breast ca  . Thyroid disease     PAST SURGICAL HISTORY :   Past Surgical History:   Procedure Laterality Date  . ABDOMINAL HYSTERECTOMY     partial  . BREAST LUMPECTOMY Left 01/13/2013   invasive mammary carcinoma, clear margins, LN negative  . BREAST LUMPECTOMY WITH SENTINEL LYMPH NODE BIOPSY Left 2014  . CHOLECYSTECTOMY    . COLONOSCOPY WITH PROPOFOL N/A 09/17/2017   Procedure: COLONOSCOPY WITH PROPOFOL;  Surgeon: Jonathon Bellows, MD;  Location: [facility] ENDOSCOPY;  Service: Gastroenterology;  Laterality: N/A;  . CYSTOSCOPY W/ URETERAL STENT PLACEMENT Right 01/07/2016   Procedure: CYSTOSCOPY WITH STENT REPLACEMENT;  Surgeon: Hollice Espy, MD;  Location: [facility] ORS;  Service: Urology;  Laterality: Right;  . CYSTOSCOPY WITH RETROGRADE PYELOGRAM, URETEROSCOPY AND STENT PLACEMENT Right 12/19/2015   Procedure: CYSTOSCOPY WITH RETROGRADE PYELOGRAM, URETEROSCOPY AND STENT PLACEMENT;  Surgeon: Ardis Hughs, MD;  Location: [facility] ORS;  Service: Urology;  Laterality: Right;  . URETEROSCOPY WITH HOLMIUM LASER LITHOTRIPSY Right 01/07/2016   Procedure: URETEROSCOPY WITH HOLMIUM LASER LITHOTRIPSY;  Surgeon: Hollice Espy, MD;  Location: [facility] ORS;  Service: Urology;  Laterality: Right;    FAMILY HISTORY :   Family History  Problem Relation Age of Onset  . Breast cancer Mother 72       deceased 110  . Breast cancer Maternal Aunt 68       deceased 20s  . Prostate cancer Father        deceased 56  . Colon cancer Paternal Aunt 80       deceased 63s    SOCIAL HISTORY:   Social History   Tobacco Use  . Smoking status: Never Smoker  . Smokeless tobacco: Never Used  Substance Use Topics  . Alcohol use: No    Alcohol/week: 0.0 standard drinks  . Drug use: No    ALLERGIES:  is allergic to sulfa antibiotics.  MEDICATIONS:  Current Outpatient Medications  Medication Sig Dispense Refill  . alendronate (FOSAMAX) 70 MG tablet Take 70 mg by mouth once a week.  3  . anastrozole (ARIMIDEX) 1 MG tablet TAKE 1 TABLET BY MOUTH EVERY DAY 90 tablet 5  . Calcium Carb-Cholecalciferol  (CALCIUM 600 + D PO) Take 1 tablet by mouth daily.    . furosemide (LASIX) 40 MG tablet Take 40 mg by mouth daily as needed for fluid or edema.     . hyoscyamine (ANASPAZ) 0.125 MG TBDP disintergrating tablet Place 0.25 mg under the tongue every 4 (four) hours as needed for cramping.    . labetalol (NORMODYNE) 200 MG tablet Take 200 mg by mouth 2 (two) times daily.     Marland Kitchen levothyroxine (SYNTHROID) 88 MCG tablet Take 88 mcg by mouth daily before breakfast.     . losartan-hydrochlorothiazide (HYZAAR) 100-25 MG tablet Take 1 tablet by mouth daily.    Marland Kitchen nystatin (MYCOSTATIN/NYSTOP) powder APPLY TO AFFECTED AREA TWICE A DAY 15 g 3  . octreotide (SANDOSTATIN LAR) 30 MG injection Inject 30 mg into the muscle every 28 (twenty-eight) days.     . potassium chloride SA (KLOR-CON) 20 MEQ tablet 1 pill  A day. 60 tablet 3  . Vitamin D, Ergocalciferol, (DRISDOL) 1.25 MG (50000 UT) CAPS capsule TAKE 1 CAPSULE (50,000 UNITS TOTAL) BY MOUTH ONCE A WEEK. 12 capsule 3   No current facility-administered  medications for this visit.    PHYSICAL EXAMINATION: ECOG PERFORMANCE STATUS: 1 - Symptomatic but completely ambulatory  BP (!) 144/59 (BP Location: Right Arm, Patient Position: Sitting)   Pulse (!) 53   Temp (!) 97 F (36.1 C) (Tympanic)   Wt 222 lb (100.7 kg)   BMI 39.33 kg/m   Filed Weights   01/13/19 1113  Weight: 222 lb (100.7 kg)    Physical Exam  Constitutional: She is oriented to person, place, and time and well-developed, well-nourished, and in no distress.  Patient is a wheelchair.  She is alone.  Patient is hard of hearing.  HENT:  Head: Normocephalic and atraumatic.  Mouth/Throat: Oropharynx is clear and moist. No oropharyngeal exudate.  Eyes: Pupils are equal, round, and reactive to light.  Cardiovascular: Normal rate and regular rhythm.  Pulmonary/Chest: No respiratory distress. She has no wheezes.  Decreased breath sounds in the right upper chest.   Abdominal: Soft. Bowel sounds are  normal. She exhibits no distension and no mass. There is no abdominal tenderness. There is no rebound and no guarding.  Musculoskeletal:        General: No tenderness. Normal range of motion.     Cervical back: Normal range of motion and neck supple.  Neurological: She is alert and oriented to person, place, and time.  Skin: Skin is warm.  Psychiatric: Affect normal.    LABORATORY DATA:  I have reviewed the data as listed    Component Value Date/Time   NA 139 01/13/2019 1044   NA 137 07/12/2013 0841   K 3.8 01/13/2019 1044   K 4.7 07/12/2013 0841   CL 104 01/13/2019 1044   CL 107 07/12/2013 0841   CO2 26 01/13/2019 1044   CO2 20 (L) 07/12/2013 0841   GLUCOSE 124 (H) 01/13/2019 1044   GLUCOSE 122 (H) 07/12/2013 0841   BUN 18 01/13/2019 1044   BUN 18 07/12/2013 0841   CREATININE 0.78 01/13/2019 1044   CREATININE 0.64 05/22/2014 1111   CALCIUM 9.1 01/13/2019 1044   CALCIUM 9.2 07/12/2013 0841   PROT 6.6 01/13/2019 1044   PROT 7.0 05/22/2014 1111   ALBUMIN 3.8 01/13/2019 1044   ALBUMIN 4.2 05/22/2014 1111   AST 26 01/13/2019 1044   AST 29 05/22/2014 1111   ALT 17 01/13/2019 1044   ALT 17 05/22/2014 1111   ALKPHOS 80 01/13/2019 1044   ALKPHOS 95 05/22/2014 1111   BILITOT 0.8 01/13/2019 1044   BILITOT 0.8 05/22/2014 1111   GFRNONAA >60 01/13/2019 1044   GFRNONAA >60 05/22/2014 1111   GFRAA >60 01/13/2019 1044   GFRAA >60 05/22/2014 1111    No results found for: SPEP, UPEP  Lab Results  Component Value Date   WBC 6.0 01/13/2019   NEUTROABS 4.2 01/13/2019   HGB 11.0 (L) 01/13/2019   HCT 35.8 (L) 01/13/2019   MCV 88.2 01/13/2019   PLT 197 01/13/2019      Chemistry      Component Value Date/Time   NA 139 01/13/2019 1044   NA 137 07/12/2013 0841   K 3.8 01/13/2019 1044   K 4.7 07/12/2013 0841   CL 104 01/13/2019 1044   CL 107 07/12/2013 0841   CO2 26 01/13/2019 1044   CO2 20 (L) 07/12/2013 0841   BUN 18 01/13/2019 1044   BUN 18 07/12/2013 0841   CREATININE  0.78 01/13/2019 1044   CREATININE 0.64 05/22/2014 1111      Component Value Date/Time   CALCIUM 9.1 01/13/2019 1044  CALCIUM 9.2 07/12/2013 0841   ALKPHOS 80 01/13/2019 1044   ALKPHOS 95 05/22/2014 1111   AST 26 01/13/2019 1044   AST 29 05/22/2014 1111   ALT 17 01/13/2019 1044   ALT 17 05/22/2014 1111   BILITOT 0.8 01/13/2019 1044   BILITOT 0.8 05/22/2014 1111       RADIOGRAPHIC STUDIES: I have personally reviewed the radiological images as listed and agreed with the findings in the report. No results found.   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 STABLE.    # Continue Sandostatin on a monthly basis.   # Iron deficient anemia-question AV malformation.  Hemoglobin 11.0-overall  STABLE.  Check iron studies.  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  STABLE.  Awaiting family/ patient cooperation with paperwork for co-pay assistance program. Will check with pharmacy.   # Hypokemia-3.3.  Continue  20 of KCl for now.STABLE.    # DISPOSITION; add iron studies/ferrtin today # Sandostatin today;  # Follow up in 1 month; MD cbc/cmp/Sandstatin; possible venofer Dr.B  Addendum: Iron studies December 17-iron deficiency; will plan IV iron at next visit.   Orders Placed This Encounter  Procedures  . Ferritin    Standing Status:   Future    Number of Occurrences:   1    Standing Expiration Date:   01/13/2020  . Iron and TIBC    Standing Status:   Future    Number of Occurrences:   1    Standing Expiration Date:   01/13/2020  . CBC with Differential    Standing Status:   Future    Standing Expiration Date:   01/13/2020  . Comprehensive metabolic panel    Standing Status:   Future    Standing Expiration Date:   01/13/2020   All  questions were answered. The patient knows to call the clinic with any problems, questions or concerns.      [name], MD 01/14/2019 7:27 AM

## 2019-01-13 NOTE — Assessment & Plan Note (Addendum)
#   Non- functional Small bowel/mesenteric carcinoid; STAGE IV-on octreotide injections monthly' gallium PET scan January /June 2020 CT-stable/no evidence of obvious progression. STABLE.    # Continue Sandostatin on a monthly basis.   # Iron deficient anemia-question AV malformation.  Hemoglobin 11.0-overall  STABLE.  Check iron studies.  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  STABLE.  Awaiting family/ patient cooperation with paperwork for co-pay assistance program. Will check with pharmacy.   # Hypokemia-3.3.  Continue  20 of KCl for now.STABLE.    # DISPOSITION; add iron studies/ferrtin today # Sandostatin today;  # Follow up in 1 month; MD cbc/cmp/Sandstatin; possible venofer Dr.B  Addendum: Iron studies December 17-iron deficiency; will plan IV iron at next visit.

## 2019-02-10 ENCOUNTER — Other Ambulatory Visit: Payer: Self-pay

## 2019-02-10 NOTE — Progress Notes (Signed)
Contacted daughter - to verify patient's medication reconciliation. Daughter inquired info regard covid-19. For patient. Information and phone number for [facility] vaccine clinic offered - phone - for [facility] - [phone].

## 2019-02-11 ENCOUNTER — Inpatient Hospital Stay: Payer: Medicare Other | Attending: Internal Medicine

## 2019-02-11 ENCOUNTER — Other Ambulatory Visit: Payer: Self-pay

## 2019-02-11 ENCOUNTER — Inpatient Hospital Stay: Payer: Medicare Other

## 2019-02-11 ENCOUNTER — Inpatient Hospital Stay (HOSPITAL_BASED_OUTPATIENT_CLINIC_OR_DEPARTMENT_OTHER): Payer: Medicare Other | Admitting: Internal Medicine

## 2019-02-11 VITALS — BP 143/79 | HR 56 | Resp 18

## 2019-02-11 DIAGNOSIS — N189 Chronic kidney disease, unspecified: Secondary | ICD-10-CM | POA: Insufficient documentation

## 2019-02-11 DIAGNOSIS — C7B02 Secondary carcinoid tumors of liver: Secondary | ICD-10-CM | POA: Diagnosis not present

## 2019-02-11 DIAGNOSIS — E340 Carcinoid syndrome: Secondary | ICD-10-CM | POA: Insufficient documentation

## 2019-02-11 DIAGNOSIS — D509 Iron deficiency anemia, unspecified: Secondary | ICD-10-CM | POA: Diagnosis not present

## 2019-02-11 DIAGNOSIS — K6389 Other specified diseases of intestine: Secondary | ICD-10-CM

## 2019-02-11 DIAGNOSIS — C7A019 Malignant carcinoid tumor of the small intestine, unspecified portion: Secondary | ICD-10-CM | POA: Insufficient documentation

## 2019-02-11 DIAGNOSIS — I509 Heart failure, unspecified: Secondary | ICD-10-CM | POA: Diagnosis not present

## 2019-02-11 DIAGNOSIS — E039 Hypothyroidism, unspecified: Secondary | ICD-10-CM | POA: Insufficient documentation

## 2019-02-11 DIAGNOSIS — I130 Hypertensive heart and chronic kidney disease with heart failure and stage 1 through stage 4 chronic kidney disease, or unspecified chronic kidney disease: Secondary | ICD-10-CM | POA: Diagnosis not present

## 2019-02-11 DIAGNOSIS — D500 Iron deficiency anemia secondary to blood loss (chronic): Secondary | ICD-10-CM

## 2019-02-11 LAB — COMPREHENSIVE METABOLIC PANEL
ALT: 16 U/L (ref 0–44)
AST: 32 U/L (ref 15–41)
Albumin: 3.9 g/dL (ref 3.5–5.0)
Alkaline Phosphatase: 81 U/L (ref 38–126)
Anion gap: 9 (ref 5–15)
BUN: 20 mg/dL (ref 8–23)
CO2: 25 mmol/L (ref 22–32)
Calcium: 9.1 mg/dL (ref 8.9–10.3)
Chloride: 104 mmol/L (ref 98–111)
Creatinine, Ser: 0.73 mg/dL (ref 0.44–1.00)
GFR calc Af Amer: >60 mL/min (ref >60)
GFR calc non Af Amer: >60 mL/min (ref >60)
Glucose, Bld: 118 mg/dL — ABNORMAL HIGH (ref 70–99)
Potassium: 4 mmol/L (ref 3.5–5.1)
Sodium: 138 mmol/L (ref 135–145)
Total Bilirubin: 0.9 mg/dL (ref 0.3–1.2)
Total Protein: 7.3 g/dL (ref 6.5–8.1)

## 2019-02-11 LAB — CBC WITH DIFFERENTIAL/PLATELET
Abs Immature Granulocytes: 0.01 10*3/uL (ref 0.00–0.07)
Basophils Absolute: 0 10*3/uL (ref 0.0–0.1)
Basophils Relative: 1 %
Eosinophils Absolute: 0.3 10*3/uL (ref 0.0–0.5)
Eosinophils Relative: 5 %
HCT: 36.5 % (ref 36.0–46.0)
Hemoglobin: 11.5 g/dL — ABNORMAL LOW (ref 12.0–15.0)
Immature Granulocytes: 0 %
Lymphocytes Relative: 12 %
Lymphs Abs: 0.7 10*3/uL (ref 0.7–4.0)
MCH: 27.2 pg (ref 26.0–34.0)
MCHC: 31.5 g/dL (ref 30.0–36.0)
MCV: 86.3 fL (ref 80.0–100.0)
Monocytes Absolute: 0.8 10*3/uL (ref 0.1–1.0)
Monocytes Relative: 14 %
Neutro Abs: 4.1 10*3/uL (ref 1.7–7.7)
Neutrophils Relative %: 68 %
Platelets: 210 10*3/uL (ref 150–400)
RBC: 4.23 MIL/uL (ref 3.87–5.11)
RDW: 15.1 % (ref 11.5–15.5)
WBC: 5.9 10*3/uL (ref 4.0–10.5)
nRBC: 0 % (ref 0.0–0.2)

## 2019-02-11 MED ORDER — SODIUM CHLORIDE 0.9 % IV SOLN
Freq: Once | INTRAVENOUS | Status: AC
  Filled 2019-02-11: qty 250

## 2019-02-11 MED ORDER — OCTREOTIDE ACETATE 30 MG IM KIT
30.0000 mg | PACK | Freq: Once | INTRAMUSCULAR | Status: AC
  Administered 2019-02-11: 30 mg via INTRAMUSCULAR

## 2019-02-11 MED ORDER — IRON SUCROSE 20 MG/ML IV SOLN
200.0000 mg | Freq: Once | INTRAVENOUS | Status: AC
  Administered 2019-02-11: 200 mg via INTRAVENOUS
  Filled 2019-02-11: qty 10

## 2019-02-11 NOTE — Progress Notes (Signed)
[name] OFFICE PROGRESS NOTE  Patient Care Team: Emma Pink, [name] Emma Graff, [name] Emma Cowman, [name] Emma Ramsay, [name]  Cancer Staging No matching staging information was found for the patient.   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   # FEB 2018- Ga PET- uptake in liver/ mesenteric mass; NOV 2019- increase sandostatin to 66m q monthly.   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  [name].  female pleasant patient above history of metastatic carcinoid tumor currently on Sandostatin is here for follow-up.  Patient continues to have chronic loose stools.   Denies any worsening abdominal pain denies any nausea vomiting.  Appetite is fair.  No weight loss but no swelling in the legs.  No flushing.    Review of Systems  Constitutional: Positive for malaise/fatigue. Negative for chills, diaphoresis, fever and weight loss.  HENT: Negative for nosebleeds and sore throat.   Eyes: Negative for double vision.  Respiratory: Negative for hemoptysis, sputum production, shortness of breath and wheezing.   Cardiovascular: Positive for leg swelling. Negative for chest pain, palpitations and orthopnea.  Gastrointestinal: Positive for diarrhea. Negative for constipation, heartburn, melena, nausea and vomiting.  Genitourinary: Negative for dysuria, frequency and urgency.  Musculoskeletal: Negative for back pain and joint pain.  Skin: Negative.  Negative for itching and rash.  Neurological: Negative for dizziness, tingling, focal weakness, weakness and headaches.  Endo/Heme/Allergies: Does not bruise/bleed easily.  Psychiatric/Behavioral: Negative for depression. The patient is not nervous/anxious and does not have insomnia.       PAST MEDICAL HISTORY :  Past Medical History:  Diagnosis Date  . Aromatase inhibitor use   . [place]   [place] breast cancer/radiation  . Breast cancer, left breast ([facility]. [place] 06/19/2014  . CHF (congestive heart failure) (HCC)    recent sepsis  . Chronic kidney disease   . Dizziness   . Dyspnea    recently while admitted  . GERD (gastroesophageal reflux disease)   . Hearing loss   . History of hiatal hernia   . History of kidney stones   . HOH (hard of hearing)    severe  . Hypertension   . Hypothyroidism   . [place]   [place] after fracture  . 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 Pain    chest pain while admitted  . Personal history of radiation therapy 2015   left breast ca  . Thyroid disease     PAST SURGICAL HISTORY :   Past Surgical History:  Procedure  Laterality Date  . ABDOMINAL HYSTERECTOMY     partial  . BREAST LUMPECTOMY Left 01/13/2013   invasive mammary carcinoma, clear margins, LN negative  . BREAST LUMPECTOMY WITH SENTINEL LYMPH NODE BIOPSY Left 2014  . CHOLECYSTECTOMY    . COLONOSCOPY WITH PROPOFOL N/A 09/17/2017   Procedure: COLONOSCOPY WITH PROPOFOL;  Surgeon: Jonathon Bellows, MD;  Location: [facility] ENDOSCOPY;  Service: Gastroenterology;  Laterality: N/A;  . CYSTOSCOPY W/ URETERAL STENT PLACEMENT Right 01/07/2016   Procedure: CYSTOSCOPY WITH STENT REPLACEMENT;  Surgeon: Hollice Espy, MD;  Location: [facility] ORS;  Service: Urology;  Laterality: Right;  . CYSTOSCOPY WITH RETROGRADE PYELOGRAM, URETEROSCOPY AND STENT PLACEMENT Right 12/19/2015   Procedure: CYSTOSCOPY WITH RETROGRADE PYELOGRAM, URETEROSCOPY AND STENT PLACEMENT;  Surgeon: Ardis Hughs, MD;  Location: [facility] ORS;  Service: Urology;  Laterality: Right;  . URETEROSCOPY WITH HOLMIUM LASER LITHOTRIPSY Right 01/07/2016   Procedure: URETEROSCOPY WITH HOLMIUM LASER LITHOTRIPSY;  Surgeon: Hollice Espy, MD;  Location: [facility] ORS;  Service: Urology;  Laterality: Right;    FAMILY HISTORY :   Family History  Problem Relation Age of Onset  . Breast cancer Mother 102       deceased 38  . Breast cancer Maternal Aunt 68       deceased 82s  . Prostate cancer Father        deceased 22  . Colon cancer Paternal Aunt 80       deceased 65s    SOCIAL HISTORY:   Social History   Tobacco Use  . Smoking status: Never Smoker  . Smokeless tobacco: Never Used  Substance Use Topics  . Alcohol use: No    Alcohol/week: 0.0 standard drinks  . Drug use: No    ALLERGIES:  is allergic to sulfa antibiotics.  MEDICATIONS:  Current Outpatient Medications  Medication Sig Dispense Refill  . alendronate (FOSAMAX) 70 MG tablet Take 70 mg by mouth once a week.  3  . anastrozole (ARIMIDEX) 1 MG tablet TAKE 1 TABLET BY MOUTH EVERY DAY 90 tablet 5  . Calcium Carb-Cholecalciferol (CALCIUM 600 + D  PO) Take 1 tablet by mouth daily.    . furosemide (LASIX) 40 MG tablet Take 40 mg by mouth daily as needed for fluid or edema.     . hyoscyamine (ANASPAZ) 0.125 MG TBDP disintergrating tablet Place 0.25 mg under the tongue every 4 (four) hours as needed for cramping.    . labetalol (NORMODYNE) 200 MG tablet Take 200 mg by mouth 2 (two) times daily.     Marland Kitchen levothyroxine (SYNTHROID) 88 MCG tablet Take 88 mcg by mouth daily before breakfast.     . losartan-hydrochlorothiazide (HYZAAR) 100-25 MG tablet Take 1 tablet by mouth daily.    Marland Kitchen nystatin (MYCOSTATIN/NYSTOP) powder APPLY TO AFFECTED AREA TWICE A DAY 15 g 3  . octreotide (SANDOSTATIN LAR) 30 MG injection Inject 30 mg into the muscle every 28 (twenty-eight) days.     . potassium chloride SA (KLOR-CON) 20 MEQ tablet 1 pill  A day. 60 tablet 3  . Vitamin D, Ergocalciferol, (DRISDOL) 1.25 MG (50000 UT) CAPS capsule TAKE 1 CAPSULE (50,000 UNITS TOTAL) BY MOUTH ONCE A WEEK. 12 capsule 3  No current facility-administered medications for this visit.    PHYSICAL EXAMINATION: ECOG PERFORMANCE STATUS: 1 - Symptomatic but completely ambulatory  BP (!) 145/63 (BP Location: Left Arm, Patient Position: Sitting, Cuff Size: Normal)   Pulse (!) 55   Temp (!) 97.2 F (36.2 C) (Tympanic)   Wt 223 lb (101.2 kg)   SpO2 96%   BMI 39.50 kg/m   Filed Weights   02/11/19 1145  Weight: 223 lb (101.2 kg)    Physical Exam  Constitutional: She is oriented to person, place, and time and well-developed, well-nourished, and in no distress.  Patient is a wheelchair.  She is alone.  Patient is hard of hearing.  HENT:  Head: Normocephalic and atraumatic.  Mouth/Throat: Oropharynx is clear and moist. No oropharyngeal exudate.  Eyes: Pupils are equal, round, and reactive to light.  Cardiovascular: Normal rate and regular rhythm.  Pulmonary/Chest: Effort normal and breath sounds normal. No respiratory distress. She has no wheezes.  Abdominal: Soft. Bowel sounds  are normal. She exhibits no distension and no mass. There is no abdominal tenderness. There is no rebound and no guarding.  Musculoskeletal:        General: No tenderness. Normal range of motion.     Cervical back: Normal range of motion and neck supple.  Neurological: She is alert and oriented to person, place, and time.  Skin: Skin is warm.  Psychiatric: Affect normal.    LABORATORY DATA:  I have reviewed the data as listed    Component Value Date/Time   NA 138 02/11/2019 1031   NA 137 07/12/2013 0841   K 4.0 02/11/2019 1031   K 4.7 07/12/2013 0841   CL 104 02/11/2019 1031   CL 107 07/12/2013 0841   CO2 25 02/11/2019 1031   CO2 20 (L) 07/12/2013 0841   GLUCOSE 118 (H) 02/11/2019 1031   GLUCOSE 122 (H) 07/12/2013 0841   BUN 20 02/11/2019 1031   BUN 18 07/12/2013 0841   CREATININE 0.73 02/11/2019 1031   CREATININE 0.64 05/22/2014 1111   CALCIUM 9.1 02/11/2019 1031   CALCIUM 9.2 07/12/2013 0841   PROT 7.3 02/11/2019 1031   PROT 7.0 05/22/2014 1111   ALBUMIN 3.9 02/11/2019 1031   ALBUMIN 4.2 05/22/2014 1111   AST 32 02/11/2019 1031   AST 29 05/22/2014 1111   ALT 16 02/11/2019 1031   ALT 17 05/22/2014 1111   ALKPHOS 81 02/11/2019 1031   ALKPHOS 95 05/22/2014 1111   BILITOT 0.9 02/11/2019 1031   BILITOT 0.8 05/22/2014 1111   GFRNONAA >60 02/11/2019 1031   GFRNONAA >60 05/22/2014 1111   GFRAA >60 02/11/2019 1031   GFRAA >60 05/22/2014 1111    No results found for: SPEP, UPEP  Lab Results  Component Value Date   WBC 5.9 02/11/2019   NEUTROABS 4.1 02/11/2019   HGB 11.5 (L) 02/11/2019   HCT 36.5 02/11/2019   MCV 86.3 02/11/2019   PLT 210 02/11/2019      Chemistry      Component Value Date/Time   NA 138 02/11/2019 1031   NA 137 07/12/2013 0841   K 4.0 02/11/2019 1031   K 4.7 07/12/2013 0841   CL 104 02/11/2019 1031   CL 107 07/12/2013 0841   CO2 25 02/11/2019 1031   CO2 20 (L) 07/12/2013 0841   BUN 20 02/11/2019 1031   BUN 18 07/12/2013 0841   CREATININE  0.73 02/11/2019 1031   CREATININE 0.64 05/22/2014 1111      Component Value Date/Time  CALCIUM 9.1 02/11/2019 1031   CALCIUM 9.2 07/12/2013 0841   ALKPHOS 81 02/11/2019 1031   ALKPHOS 95 05/22/2014 1111   AST 32 02/11/2019 1031   AST 29 05/22/2014 1111   ALT 16 02/11/2019 1031   ALT 17 05/22/2014 1111   BILITOT 0.9 02/11/2019 1031   BILITOT 0.8 05/22/2014 1111       RADIOGRAPHIC STUDIES: I have personally reviewed the radiological images as listed and agreed with the findings in the report. No results found.   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  Stable.  # Continue Sandostatin on a monthly basis.   # Iron deficient anemia-question AV malformation.  Hemoglobin 11.5; Iron sat-9%; recommend proceeding with IV iron today.  # chronic mild diarrhea- ? Sec to carcinoid-not on [employer] issues.  Stable.  # I discussed regarding Covid-19 precautions.  I reviewed the vaccine effectiveness and potential side effects in detail.  Also discussed long-term effectiveness and safety profile are unclear at this time.  I discussed December, 2020 [employer] position statement-that all patients are recommended COVID-19 vaccinations [when available]-as long as they do not have allergy to components of the vaccine.  However, I think the benefits of the vaccination outweigh the potential risks. Re: [id number] vaccination.  For more information/scheduling recommend call [phone], 8:30am-4:30pm.   #  I offered to call the patient's daughter to give an update on clinical status; patient declines.   # DISPOSITION- # Sandostatin & venofer today;  # Follow up in 1 month; MD cbc/cmp/Sandstatin; possible venofer Dr.B   Orders Placed This Encounter   Procedures  . CBC with Differential    Standing Status:   Future    Standing Expiration Date:   02/11/2020  . Comprehensive metabolic panel    Standing Status:   Future    Standing Expiration Date:   02/11/2020   All questions were answered. The patient knows to call the clinic with any problems, questions or concerns.      [name], MD 02/14/2019 8:06 AM

## 2019-02-11 NOTE — Assessment & Plan Note (Addendum)
#   Non- functional Small bowel/mesenteric carcinoid; STAGE IV-on octreotide injections monthly' gallium PET scan January /June 2020 CT-stable/no evidence of obvious progression.  Stable.  # Continue Sandostatin on a monthly basis.   # Iron deficient anemia-question AV malformation.  Hemoglobin 11.5; Iron sat-9%; recommend proceeding with IV iron today.  # chronic mild diarrhea- ? Sec to carcinoid-not on [employer] issues.  Stable.  # I discussed regarding Covid-19 precautions.  I reviewed the vaccine effectiveness and potential side effects in detail.  Also discussed long-term effectiveness and safety profile are unclear at this time.  I discussed December, 2020 [employer] position statement-that all patients are recommended COVID-19 vaccinations [when available]-as long as they do not have allergy to components of the vaccine.  However, I think the benefits of the vaccination outweigh the potential risks. Re: [id number] vaccination.  For more information/scheduling recommend call [phone], 8:30am-4:30pm.   #  I offered to call the patient's daughter to give an update on clinical status; patient declines.   # DISPOSITION- # Sandostatin & venofer today;  # Follow up in 1 month; MD cbc/cmp/Sandstatin; possible venofer Dr.B

## 2019-02-11 NOTE — Patient Instructions (Signed)
COVID-19 vaccine: For more information/scheduling recommend call [phone], 8:30am-4:30pm.

## 2019-03-11 ENCOUNTER — Encounter: Payer: Self-pay | Admitting: Internal Medicine

## 2019-03-11 ENCOUNTER — Inpatient Hospital Stay (HOSPITAL_BASED_OUTPATIENT_CLINIC_OR_DEPARTMENT_OTHER): Payer: Medicare Other | Admitting: Internal Medicine

## 2019-03-11 ENCOUNTER — Other Ambulatory Visit: Payer: Self-pay

## 2019-03-11 ENCOUNTER — Inpatient Hospital Stay: Payer: Medicare Other | Attending: Internal Medicine

## 2019-03-11 ENCOUNTER — Inpatient Hospital Stay: Payer: Medicare Other

## 2019-03-11 DIAGNOSIS — D509 Iron deficiency anemia, unspecified: Secondary | ICD-10-CM | POA: Insufficient documentation

## 2019-03-11 DIAGNOSIS — K921 Melena: Secondary | ICD-10-CM | POA: Diagnosis not present

## 2019-03-11 DIAGNOSIS — K6389 Other specified diseases of intestine: Secondary | ICD-10-CM

## 2019-03-11 DIAGNOSIS — C7B02 Secondary carcinoid tumors of liver: Secondary | ICD-10-CM

## 2019-03-11 DIAGNOSIS — C7A019 Malignant carcinoid tumor of the small intestine, unspecified portion: Secondary | ICD-10-CM

## 2019-03-11 DIAGNOSIS — N189 Chronic kidney disease, unspecified: Secondary | ICD-10-CM | POA: Diagnosis not present

## 2019-03-11 DIAGNOSIS — I130 Hypertensive heart and chronic kidney disease with heart failure and stage 1 through stage 4 chronic kidney disease, or unspecified chronic kidney disease: Secondary | ICD-10-CM | POA: Diagnosis not present

## 2019-03-11 DIAGNOSIS — Z79811 Long term (current) use of aromatase inhibitors: Secondary | ICD-10-CM | POA: Diagnosis not present

## 2019-03-11 DIAGNOSIS — E340 Carcinoid syndrome: Secondary | ICD-10-CM | POA: Diagnosis not present

## 2019-03-11 DIAGNOSIS — I509 Heart failure, unspecified: Secondary | ICD-10-CM | POA: Insufficient documentation

## 2019-03-11 DIAGNOSIS — E039 Hypothyroidism, unspecified: Secondary | ICD-10-CM | POA: Diagnosis not present

## 2019-03-11 LAB — CBC WITH DIFFERENTIAL/PLATELET
Abs Immature Granulocytes: 0.02 10*3/uL (ref 0.00–0.07)
Basophils Absolute: 0.1 10*3/uL (ref 0.0–0.1)
Basophils Relative: 1 %
Eosinophils Absolute: 0.3 10*3/uL (ref 0.0–0.5)
Eosinophils Relative: 6 %
HCT: 36.8 % (ref 36.0–46.0)
Hemoglobin: 11.5 g/dL — ABNORMAL LOW (ref 12.0–15.0)
Immature Granulocytes: 0 %
Lymphocytes Relative: 10 %
Lymphs Abs: 0.6 10*3/uL — ABNORMAL LOW (ref 0.7–4.0)
MCH: 27.7 pg (ref 26.0–34.0)
MCHC: 31.3 g/dL (ref 30.0–36.0)
MCV: 88.7 fL (ref 80.0–100.0)
Monocytes Absolute: 0.7 10*3/uL (ref 0.1–1.0)
Monocytes Relative: 12 %
Neutro Abs: 4.1 10*3/uL (ref 1.7–7.7)
Neutrophils Relative %: 71 %
Platelets: 216 10*3/uL (ref 150–400)
RBC: 4.15 MIL/uL (ref 3.87–5.11)
RDW: 16 % — ABNORMAL HIGH (ref 11.5–15.5)
WBC: 5.8 10*3/uL (ref 4.0–10.5)
nRBC: 0 % (ref 0.0–0.2)

## 2019-03-11 LAB — COMPREHENSIVE METABOLIC PANEL
ALT: 17 U/L (ref 0–44)
AST: 23 U/L (ref 15–41)
Albumin: 3.8 g/dL (ref 3.5–5.0)
Alkaline Phosphatase: 78 U/L (ref 38–126)
Anion gap: 10 (ref 5–15)
BUN: 16 mg/dL (ref 8–23)
CO2: 23 mmol/L (ref 22–32)
Calcium: 9.3 mg/dL (ref 8.9–10.3)
Chloride: 103 mmol/L (ref 98–111)
Creatinine, Ser: 0.67 mg/dL (ref 0.44–1.00)
GFR calc Af Amer: >60 mL/min (ref >60)
GFR calc non Af Amer: >60 mL/min (ref >60)
Glucose, Bld: 122 mg/dL — ABNORMAL HIGH (ref 70–99)
Potassium: 3.6 mmol/L (ref 3.5–5.1)
Sodium: 136 mmol/L (ref 135–145)
Total Bilirubin: 0.7 mg/dL (ref 0.3–1.2)
Total Protein: 6.9 g/dL (ref 6.5–8.1)

## 2019-03-11 MED ORDER — SODIUM CHLORIDE 0.9 % IV SOLN
Freq: Once | INTRAVENOUS | Status: AC
  Filled 2019-03-11: qty 250

## 2019-03-11 MED ORDER — IRON SUCROSE 20 MG/ML IV SOLN
200.0000 mg | Freq: Once | INTRAVENOUS | Status: AC
  Administered 2019-03-11: 200 mg via INTRAVENOUS
  Filled 2019-03-11: qty 10

## 2019-03-11 MED ORDER — OCTREOTIDE ACETATE 30 MG IM KIT
30.0000 mg | PACK | Freq: Once | INTRAMUSCULAR | Status: AC
  Administered 2019-03-11: 30 mg via INTRAMUSCULAR
  Filled 2019-03-11: qty 1

## 2019-03-11 MED ORDER — VITAMIN D (ERGOCALCIFEROL) 1.25 MG (50000 UNIT) PO CAPS: 50000.0000 [IU] | ORAL_CAPSULE | ORAL | 3 refills | Status: DC

## 2019-03-11 NOTE — Progress Notes (Signed)
[place] OFFICE PROGRESS NOTE  [name], [name] [name], [name] [name], [name] [name], [name]  Cancer Staging No matching staging information was found for the patient.   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   # FEB 2018- Ga PET- uptake in liver/ mesenteric mass; NOV 2019- increase sandostatin to 37m q monthly.   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  [name].  female pleasant patient above history of metastatic carcinoid tumor currently on Sandostatin/iron deficient anemia question AV malformation is here for  follow-up.  Patient continues to have intermittent diarrhea.  No worsening abdominal pain nausea vomiting.  Appetite is fair.  No weight loss.  She continues to have intermittent blood in stools chronic.  No flushing.   Review of Systems  Constitutional: Positive for malaise/fatigue. Negative for chills, diaphoresis, fever and weight loss.  HENT: Negative for nosebleeds and sore throat.   Eyes: Negative for double vision.  Respiratory: Negative for hemoptysis, sputum production, shortness of breath and wheezing.   Cardiovascular: Positive for leg swelling. Negative for chest pain, palpitations and orthopnea.  Gastrointestinal: Positive for blood in stool and diarrhea. Negative for constipation, heartburn, melena, nausea and vomiting.  Genitourinary: Negative for dysuria, frequency and urgency.  Musculoskeletal: Negative for back pain and joint pain.  Skin: Negative.  Negative for itching and rash.  Neurological: Negative for dizziness, tingling, focal weakness, weakness and headaches.  Endo/Heme/Allergies: Does not bruise/bleed easily.  Psychiatric/Behavioral: Negative for depression. The patient is not nervous/anxious and does not have insomnia.       PAST MEDICAL HISTORY :  Past Medical History:  Diagnosis Date  . Aromatase inhibitor use   . Breast cancer ([place]   left breast cancer/radiation  . [place]  . CHF (congestive heart failure) (HCC)    recent sepsis  . Chronic kidney disease   . Dizziness   . Dyspnea    recently while admitted  . GERD (gastroesophageal reflux disease)   . Hearing loss   . History of hiatal hernia   . History of kidney stones   . HOH (hard of hearing)    severe  . Hypertension   . Hypothyroidism   .  Leg DVT (deep venous thromboembolism), acute ([place]) 07/2015   left leg after fracture  . 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 Pain    chest pain while admitted  . Personal history of radiation therapy 2015   left  breast ca  . Thyroid disease     PAST SURGICAL HISTORY :   Past Surgical History:  Procedure Laterality Date  . ABDOMINAL HYSTERECTOMY     partial  . BREAST LUMPECTOMY Left 01/13/2013   invasive mammary carcinoma, clear margins, LN negative  . BREAST LUMPECTOMY WITH SENTINEL LYMPH NODE BIOPSY Left 2014  . CHOLECYSTECTOMY    . COLONOSCOPY WITH PROPOFOL N/A 09/17/2017   Procedure: COLONOSCOPY WITH PROPOFOL;  Surgeon: Jonathon Bellows, MD;  Location[facility] ENDOSCOPY;  Service: Gastroenterology;  Laterality: N/A;  . CYSTOSCOPY W/ URETERAL STENT PLACEMENT Right 01/07/2016   Procedure: CYSTOSCOPY WITH STENT REPLACEMENT;  Surgeon: Hollice Espy, MD;  Location: [facility] ORS;  Service: Urology;  Laterality: Right;  . CYSTOSCOPY WITH RETROGRADE PYELOGRAM, URETEROSCOPY AND STENT PLACEMENT Right 12/19/2015   Procedure: CYSTOSCOPY WITH RETROGRADE PYELOGRAM, URETEROSCOPY AND STENT PLACEMENT;  Surgeon: Ardis Hughs, MD;  Location: [facility] ORS;  Service: Urology;  Laterality: Right;  . URETEROSCOPY WITH HOLMIUM LASER LITHOTRIPSY Right 01/07/2016   Procedure: URETEROSCOPY WITH HOLMIUM LASER LITHOTRIPSY;  Surgeon: Hollice Espy, MD;  Location: [facility] ORS;  Service: Urology;  Laterality: Right;    FAMILY HISTORY :   Family History  Problem Relation Age of Onset  . Breast cancer Mother 10       deceased 53  . Breast cancer Maternal Aunt 68       deceased 14s  . Prostate cancer Father        deceased 67  . Colon cancer Paternal Aunt 80       deceased 59s    SOCIAL HISTORY:   Social History   Tobacco Use  . Smoking status: Never Smoker  . Smokeless tobacco: Never Used  Substance Use Topics  . Alcohol use: No    Alcohol/week: 0.0 standard drinks  . Drug use: No    ALLERGIES:  is allergic to sulfa antibiotics.  MEDICATIONS:  Current Outpatient Medications  Medication Sig Dispense Refill  . alendronate (FOSAMAX) 70 MG tablet Take 70 mg by mouth once a week.  3  . anastrozole (ARIMIDEX) 1 MG  tablet TAKE 1 TABLET BY MOUTH EVERY DAY 90 tablet 5  . Calcium Carb-Cholecalciferol (CALCIUM 600 + D PO) Take 1 tablet by mouth daily.    . furosemide (LASIX) 40 MG tablet Take 40 mg by mouth daily as needed for fluid or edema.     . hyoscyamine (ANASPAZ) 0.125 MG TBDP disintergrating tablet Place 0.25 mg under the tongue every 4 (four) hours as needed for cramping.    . labetalol (NORMODYNE) 200 MG tablet Take 200 mg by mouth 2 (two) times daily.     Marland Kitchen levothyroxine (SYNTHROID) 88 MCG tablet Take 88 mcg by mouth daily before breakfast.     . losartan-hydrochlorothiazide (HYZAAR) 100-25 MG tablet Take 1 tablet by mouth daily.    Marland Kitchen nystatin (MYCOSTATIN/NYSTOP) powder APPLY TO AFFECTED AREA TWICE A DAY 15 g 3  . octreotide (SANDOSTATIN LAR) 30 MG injection Inject 30 mg into the muscle every 28 (twenty-eight) days.     . potassium chloride SA (KLOR-CON) 20 MEQ tablet 1 pill  A day. 60 tablet 3  . Vitamin D, Ergocalciferol, (DRISDOL) 1.25 MG (50000 UNIT) CAPS capsule Take 1 capsule (50,000 Units total) by mouth once  a week. 12 capsule 3   No current facility-administered medications for this visit.    PHYSICAL EXAMINATION: ECOG PERFORMANCE STATUS: 1 - Symptomatic but completely ambulatory  BP (!) 146/60 (BP Location: Right Arm, Patient Position: Sitting, Cuff Size: Large)   Pulse (!) 54   Temp 97.6 F (36.4 C) (Tympanic)   Wt 223 lb (101.2 kg)   BMI 39.50 kg/m   Filed Weights   03/11/19 1120  Weight: 223 lb (101.2 kg)    Physical Exam  Constitutional: She is oriented to person, place, and time and well-developed, well-nourished, and in no distress.  Patient is a wheelchair.  She is alone.  Patient is hard of hearing.  HENT:  Head: Normocephalic and atraumatic.  Mouth/Throat: Oropharynx is clear and moist. No oropharyngeal exudate.  Eyes: Pupils are equal, round, and reactive to light.  Cardiovascular: Normal rate and regular rhythm.  Pulmonary/Chest: Effort normal and breath sounds  normal. No respiratory distress. She has no wheezes.  Abdominal: Soft. Bowel sounds are normal. She exhibits no distension and no mass. There is no abdominal tenderness. There is no rebound and no guarding.  Musculoskeletal:        General: No tenderness. Normal range of motion.     Cervical back: Normal range of motion and neck supple.  Neurological: She is alert and oriented to person, place, and time.  Skin: Skin is warm.  Psychiatric: Affect normal.    LABORATORY DATA:  I have reviewed the data as listed    Component Value Date/Time   NA 136 03/11/2019 1027   NA 137 07/12/2013 0841   K 3.6 03/11/2019 1027   K 4.7 07/12/2013 0841   CL 103 03/11/2019 1027   CL 107 07/12/2013 0841   CO2 23 03/11/2019 1027   CO2 20 (L) 07/12/2013 0841   GLUCOSE 122 (H) 03/11/2019 1027   GLUCOSE 122 (H) 07/12/2013 0841   BUN 16 03/11/2019 1027   BUN 18 07/12/2013 0841   CREATININE 0.67 03/11/2019 1027   CREATININE 0.64 05/22/2014 1111   CALCIUM 9.3 03/11/2019 1027   CALCIUM 9.2 07/12/2013 0841   PROT 6.9 03/11/2019 1027   PROT 7.0 05/22/2014 1111   ALBUMIN 3.8 03/11/2019 1027   ALBUMIN 4.2 05/22/2014 1111   AST 23 03/11/2019 1027   AST 29 05/22/2014 1111   ALT 17 03/11/2019 1027   ALT 17 05/22/2014 1111   ALKPHOS 78 03/11/2019 1027   ALKPHOS 95 05/22/2014 1111   BILITOT 0.7 03/11/2019 1027   BILITOT 0.8 05/22/2014 1111   GFRNONAA >60 03/11/2019 1027   GFRNONAA >60 05/22/2014 1111   GFRAA >60 03/11/2019 1027   GFRAA >60 05/22/2014 1111    No results found for: SPEP, UPEP  Lab Results  Component Value Date   WBC 5.8 03/11/2019   NEUTROABS 4.1 03/11/2019   HGB 11.5 (L) 03/11/2019   HCT 36.8 03/11/2019   MCV 88.7 03/11/2019   PLT 216 03/11/2019      Chemistry      Component Value Date/Time   NA 136 03/11/2019 1027   NA 137 07/12/2013 0841   K 3.6 03/11/2019 1027   K 4.7 07/12/2013 0841   CL 103 03/11/2019 1027   CL 107 07/12/2013 0841   CO2 23 03/11/2019 1027   CO2 20  (L) 07/12/2013 0841   BUN 16 03/11/2019 1027   BUN 18 07/12/2013 0841   CREATININE 0.67 03/11/2019 1027   CREATININE 0.64 05/22/2014 1111      Component Value  Date/Time   CALCIUM 9.3 03/11/2019 1027   CALCIUM 9.2 07/12/2013 0841   ALKPHOS 78 03/11/2019 1027   ALKPHOS 95 05/22/2014 1111   AST 23 03/11/2019 1027   AST 29 05/22/2014 1111   ALT 17 03/11/2019 1027   ALT 17 05/22/2014 1111   BILITOT 0.7 03/11/2019 1027   BILITOT 0.8 05/22/2014 1111       RADIOGRAPHIC STUDIES: I have personally reviewed the radiological images as listed and agreed with the findings in the report. No results found.   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  Stable.  # Continue Sandostatin on a monthly basis.   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  Hemoglobin 11.5; STABLE-;dec [id number]- Iron sat-9%; recommend proceeding with IV iron today.  # chronic mild diarrhea- ? Sec to carcinoid [not on xarmelo secondary insurance issues--stable.   # DISPOSITION- # Sandostatin & venofer today;  # Follow up in 1 month; MD cbc/cmp/Sandstatin; possible venofer Dr.B   No orders of the defined types were placed in this encounter.  All questions were answered. The patient knows to call the clinic with any problems, questions or concerns.      [name], MD 03/11/2019 12:02 PM

## 2019-03-11 NOTE — Assessment & Plan Note (Addendum)
#   Non- functional Small bowel/mesenteric carcinoid; STAGE IV-on octreotide injections monthly' gallium PET scan January /June 2020 CT-stable/no evidence of obvious progression.  Stable.  # Continue Sandostatin on a monthly basis.   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  Hemoglobin 11.5; STABLE-;dec [id number]- Iron sat-9%; recommend proceeding with IV iron today.  # chronic mild diarrhea- ? Sec to carcinoid [not on xarmelo secondary insurance issues--stable.   # DISPOSITION- # Sandostatin & venofer today;  # Follow up in 1 month; MD cbc/cmp/Sandstatin; possible venofer Dr.B

## 2019-04-14 ENCOUNTER — Other Ambulatory Visit: Payer: Medicare Other

## 2019-04-14 ENCOUNTER — Ambulatory Visit: Payer: Medicare Other

## 2019-04-14 ENCOUNTER — Ambulatory Visit: Payer: Medicare Other | Admitting: Internal Medicine

## 2019-04-15 ENCOUNTER — Other Ambulatory Visit: Payer: Self-pay | Admitting: *Deleted

## 2019-04-15 ENCOUNTER — Other Ambulatory Visit: Payer: Self-pay

## 2019-04-15 DIAGNOSIS — K6389 Other specified diseases of intestine: Secondary | ICD-10-CM

## 2019-04-15 DIAGNOSIS — C7B02 Secondary carcinoid tumors of liver: Secondary | ICD-10-CM

## 2019-04-18 ENCOUNTER — Other Ambulatory Visit: Payer: Self-pay

## 2019-04-18 ENCOUNTER — Inpatient Hospital Stay: Payer: Medicare Other | Attending: Internal Medicine

## 2019-04-18 ENCOUNTER — Inpatient Hospital Stay: Payer: Medicare Other

## 2019-04-18 ENCOUNTER — Inpatient Hospital Stay (HOSPITAL_BASED_OUTPATIENT_CLINIC_OR_DEPARTMENT_OTHER): Payer: Medicare Other | Admitting: Internal Medicine

## 2019-04-18 DIAGNOSIS — C7A019 Malignant carcinoid tumor of the small intestine, unspecified portion: Secondary | ICD-10-CM | POA: Insufficient documentation

## 2019-04-18 DIAGNOSIS — C7B02 Secondary carcinoid tumors of liver: Secondary | ICD-10-CM | POA: Diagnosis not present

## 2019-04-18 DIAGNOSIS — Z853 Personal history of malignant neoplasm of breast: Secondary | ICD-10-CM | POA: Insufficient documentation

## 2019-04-18 DIAGNOSIS — E340 Carcinoid syndrome: Secondary | ICD-10-CM | POA: Diagnosis not present

## 2019-04-18 DIAGNOSIS — K6389 Other specified diseases of intestine: Secondary | ICD-10-CM

## 2019-04-18 LAB — CBC WITH DIFFERENTIAL/PLATELET
Abs Immature Granulocytes: 0.01 10*3/uL (ref 0.00–0.07)
Basophils Absolute: 0.1 10*3/uL (ref 0.0–0.1)
Basophils Relative: 1 %
Eosinophils Absolute: 0.3 10*3/uL (ref 0.0–0.5)
Eosinophils Relative: 5 %
HCT: 37.7 % (ref 36.0–46.0)
Hemoglobin: 12.4 g/dL (ref 12.0–15.0)
Immature Granulocytes: 0 %
Lymphocytes Relative: 11 %
Lymphs Abs: 0.7 10*3/uL (ref 0.7–4.0)
MCH: 29 pg (ref 26.0–34.0)
MCHC: 32.9 g/dL (ref 30.0–36.0)
MCV: 88.3 fL (ref 80.0–100.0)
Monocytes Absolute: 0.9 10*3/uL (ref 0.1–1.0)
Monocytes Relative: 14 %
Neutro Abs: 4.3 10*3/uL (ref 1.7–7.7)
Neutrophils Relative %: 69 %
Platelets: 203 10*3/uL (ref 150–400)
RBC: 4.27 MIL/uL (ref 3.87–5.11)
RDW: 16.3 % — ABNORMAL HIGH (ref 11.5–15.5)
WBC: 6.3 10*3/uL (ref 4.0–10.5)
nRBC: 0 % (ref 0.0–0.2)

## 2019-04-18 LAB — COMPREHENSIVE METABOLIC PANEL
ALT: 19 U/L (ref 0–44)
AST: 26 U/L (ref 15–41)
Albumin: 4 g/dL (ref 3.5–5.0)
Alkaline Phosphatase: 80 U/L (ref 38–126)
Anion gap: 11 (ref 5–15)
BUN: 18 mg/dL (ref 8–23)
CO2: 23 mmol/L (ref 22–32)
Calcium: 8.9 mg/dL (ref 8.9–10.3)
Chloride: 104 mmol/L (ref 98–111)
Creatinine, Ser: 0.74 mg/dL (ref 0.44–1.00)
GFR calc Af Amer: >60 mL/min (ref >60)
GFR calc non Af Amer: >60 mL/min (ref >60)
Glucose, Bld: 115 mg/dL — ABNORMAL HIGH (ref 70–99)
Potassium: 3.7 mmol/L (ref 3.5–5.1)
Sodium: 138 mmol/L (ref 135–145)
Total Bilirubin: 0.8 mg/dL (ref 0.3–1.2)
Total Protein: 6.9 g/dL (ref 6.5–8.1)

## 2019-04-18 MED ORDER — OCTREOTIDE ACETATE 30 MG IM KIT
30.0000 mg | PACK | Freq: Once | INTRAMUSCULAR | Status: AC
  Administered 2019-04-18: 30 mg via INTRAMUSCULAR
  Filled 2019-04-18: qty 1

## 2019-04-18 NOTE — Progress Notes (Signed)
[place] OFFICE PROGRESS NOTE  [name], [name] [name], [name] [name], [name] [name], [name]  Cancer Staging No matching staging information was found for the patient.   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   # FEB 2018- Ga PET- uptake in liver/ mesenteric mass; NOV 2019- increase sandostatin to 60m q monthly.   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  [name].o.  female pleasant patient above  history of metastatic carcinoid tumor currently on Sandostatin/iron deficient anemia question AV malformation is here for follow-up.  Patient denies any blood in stools.  Denies any nausea vomiting.  Continues to have intermittent diarrhea not any worse.  No weight loss.  Denies any flushing.   Review of Systems  Constitutional: Positive for malaise/fatigue. Negative for chills, diaphoresis, fever and weight loss.  HENT: Negative for nosebleeds and sore throat.   Eyes: Negative for double vision.  Respiratory: Negative for hemoptysis, sputum production, shortness of breath and wheezing.   Cardiovascular: Positive for leg swelling. Negative for chest pain, palpitations and orthopnea.  Gastrointestinal: Positive for diarrhea. Negative for constipation, heartburn, melena, nausea and vomiting.  Genitourinary: Negative for dysuria, frequency and urgency.  Musculoskeletal: Negative for back pain and joint pain.  Skin: Negative.  Negative for itching and rash.  Neurological: Negative for dizziness, tingling, focal weakness, weakness and headaches.  Endo/Heme/Allergies: Does not bruise/bleed easily.  Psychiatric/Behavioral: Negative for depression. The patient is not nervous/anxious and does not have insomnia.       PAST MEDICAL HISTORY :  Past Medical History:  Diagnosis Date  . Aromatase inhibitor use   . [place]   [place] breast cancer/radiation  . [place] breast [place]  . CHF (congestive heart failure) (HCC)    recent sepsis  . Chronic kidney disease   . Dizziness   . Dyspnea    recently while admitted  . GERD (gastroesophageal reflux disease)   . Hearing loss   . History of hiatal hernia   . History of kidney stones   . HOH (hard of hearing)  severe  . Hypertension   . Hypothyroidism   . Leg DVT (deep venous thromboembolism), acute ([place]) 07/2015   left leg after fracture  . 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 Pain    chest pain while admitted   . Personal history of radiation therapy 2015   left breast ca  . Thyroid disease     PAST SURGICAL HISTORY :   Past Surgical History:  Procedure Laterality Date  . ABDOMINAL HYSTERECTOMY     partial  . BREAST LUMPECTOMY Left 01/13/2013   invasive mammary carcinoma, clear margins, LN negative  . BREAST LUMPECTOMY WITH SENTINEL LYMPH NODE BIOPSY Left 2014  . CHOLECYSTECTOMY    . COLONOSCOPY WITH PROPOFOL N/A 09/17/2017   Procedure: COLONOSCOPY WITH PROPOFOL;  Surgeon: Jonathon Bellows, MD;  Location[facility] ENDOSCOPY;  Service: Gastroenterology;  Laterality: N/A;  . CYSTOSCOPY W/ URETERAL STENT PLACEMENT Right 01/07/2016   Procedure: CYSTOSCOPY WITH STENT REPLACEMENT;  Surgeon: Hollice Espy, MD;  Location: [facility] ORS;  Service: Urology;  Laterality: Right;  . CYSTOSCOPY WITH RETROGRADE PYELOGRAM, URETEROSCOPY AND STENT PLACEMENT Right 12/19/2015   Procedure: CYSTOSCOPY WITH RETROGRADE PYELOGRAM, URETEROSCOPY AND STENT PLACEMENT;  Surgeon: Ardis Hughs, MD;  Location: [facility] ORS;  Service: Urology;  Laterality: Right;  . URETEROSCOPY WITH HOLMIUM LASER LITHOTRIPSY Right 01/07/2016   Procedure: URETEROSCOPY WITH HOLMIUM LASER LITHOTRIPSY;  Surgeon: Hollice Espy, MD;  Location: [facility] ORS;  Service: Urology;  Laterality: Right;    FAMILY HISTORY :   Family History  Problem Relation Age of Onset  . Breast cancer Mother 79       deceased 77  . Breast cancer Maternal Aunt 68       deceased 50s  . Prostate cancer Father        deceased 11  . Colon cancer Paternal Aunt 80       deceased 66s    SOCIAL HISTORY:   Social History   Tobacco Use  . Smoking status: Never Smoker  . Smokeless tobacco: Never Used  Substance Use Topics  . Alcohol use: No    Alcohol/week: 0.0 standard drinks  . Drug use: No    ALLERGIES:  is allergic to sulfa antibiotics.  MEDICATIONS:  Current Outpatient Medications  Medication Sig Dispense Refill  . alendronate (FOSAMAX) 70 MG tablet Take 70 mg by  mouth once a week.  3  . anastrozole (ARIMIDEX) 1 MG tablet TAKE 1 TABLET BY MOUTH EVERY DAY 90 tablet 5  . Calcium Carb-Cholecalciferol (CALCIUM 600 + D PO) Take 1 tablet by mouth daily.    . furosemide (LASIX) 40 MG tablet Take 40 mg by mouth daily as needed for fluid or edema.     . hyoscyamine (ANASPAZ) 0.125 MG TBDP disintergrating tablet Place 0.25 mg under the tongue every 4 (four) hours as needed for cramping.    . labetalol (NORMODYNE) 200 MG tablet Take 200 mg by mouth 2 (two) times daily.     Marland Kitchen levothyroxine (SYNTHROID) 88 MCG tablet Take 88 mcg by mouth daily before breakfast.     . losartan-hydrochlorothiazide (HYZAAR) 100-25 MG tablet Take 1 tablet by mouth daily.    Marland Kitchen nystatin (MYCOSTATIN/NYSTOP) powder APPLY TO AFFECTED AREA TWICE A DAY 15 g 3  . octreotide (SANDOSTATIN LAR) 30 MG injection Inject 30 mg into the muscle every 28 (twenty-eight) days.     . potassium chloride SA (KLOR-CON) 20 MEQ tablet 1 pill  A day. 60 tablet 3  . Vitamin D, Ergocalciferol, (DRISDOL) 1.25 MG (50000 UNIT)  CAPS capsule Take 1 capsule (50,000 Units total) by mouth once a week. 12 capsule 3   No current facility-administered medications for this visit.   Facility-Administered Medications Ordered in Other Visits  Medication Dose Route Frequency Provider Last Rate Last Admin  . octreotide (SANDOSTATIN LAR) IM injection 30 mg  30 mg Intramuscular Once [name], MD        PHYSICAL EXAMINATION: ECOG PERFORMANCE STATUS: 1 - Symptomatic but completely ambulatory  BP (!) 144/64 (BP Location: Right Arm, Patient Position: Sitting, Cuff Size: Normal)   Pulse (!) 55   Temp 97.6 F (36.4 C) (Tympanic)   Wt 223 lb 8 oz (101.4 kg)   SpO2 97%   BMI 39.59 kg/m   Filed Weights   04/18/19 1316  Weight: 223 lb 8 oz (101.4 kg)    Physical Exam  Constitutional: She is oriented to person, place, and time and well-developed, well-nourished, and in no distress.  Patient is a wheelchair.  She is  alone.  Patient is hard of hearing.  HENT:  Head: Normocephalic and atraumatic.  Mouth/Throat: Oropharynx is clear and moist. No oropharyngeal exudate.  Eyes: Pupils are equal, round, and reactive to light.  Cardiovascular: Normal rate and regular rhythm.  Pulmonary/Chest: Effort normal and breath sounds normal. No respiratory distress. She has no wheezes.  Abdominal: Soft. Bowel sounds are normal. She exhibits no distension and no mass. There is no abdominal tenderness. There is no rebound and no guarding.  Musculoskeletal:        General: No tenderness. Normal range of motion.     Cervical back: Normal range of motion and neck supple.  Neurological: She is alert and oriented to person, place, and time.  Skin: Skin is warm.  Psychiatric: Affect normal.    LABORATORY DATA:  I have reviewed the data as listed    Component Value Date/Time   NA 138 04/18/2019 1246   NA 137 07/12/2013 0841   K 3.7 04/18/2019 1246   K 4.7 07/12/2013 0841   CL 104 04/18/2019 1246   CL 107 07/12/2013 0841   CO2 23 04/18/2019 1246   CO2 20 (L) 07/12/2013 0841   GLUCOSE 115 (H) 04/18/2019 1246   GLUCOSE 122 (H) 07/12/2013 0841   BUN 18 04/18/2019 1246   BUN 18 07/12/2013 0841   CREATININE 0.74 04/18/2019 1246   CREATININE 0.64 05/22/2014 1111   CALCIUM 8.9 04/18/2019 1246   CALCIUM 9.2 07/12/2013 0841   PROT 6.9 04/18/2019 1246   PROT 7.0 05/22/2014 1111   ALBUMIN 4.0 04/18/2019 1246   ALBUMIN 4.2 05/22/2014 1111   AST 26 04/18/2019 1246   AST 29 05/22/2014 1111   ALT 19 04/18/2019 1246   ALT 17 05/22/2014 1111   ALKPHOS 80 04/18/2019 1246   ALKPHOS 95 05/22/2014 1111   BILITOT 0.8 04/18/2019 1246   BILITOT 0.8 05/22/2014 1111   GFRNONAA >60 04/18/2019 1246   GFRNONAA >60 05/22/2014 1111   GFRAA >60 04/18/2019 1246   GFRAA >60 05/22/2014 1111    No results found for: SPEP, UPEP  Lab Results  Component Value Date   WBC 6.3 04/18/2019   NEUTROABS 4.3 04/18/2019   HGB 12.4 04/18/2019    HCT 37.7 04/18/2019   MCV 88.3 04/18/2019   PLT 203 04/18/2019      Chemistry      Component Value Date/Time   NA 138 04/18/2019 1246   NA 137 07/12/2013 0841   K 3.7 04/18/2019 1246   K 4.7  07/12/2013 0841   CL 104 04/18/2019 1246   CL 107 07/12/2013 0841   CO2 23 04/18/2019 1246   CO2 20 (L) 07/12/2013 0841   BUN 18 04/18/2019 1246   BUN 18 07/12/2013 0841   CREATININE 0.74 04/18/2019 1246   CREATININE 0.64 05/22/2014 1111      Component Value Date/Time   CALCIUM 8.9 04/18/2019 1246   CALCIUM 9.2 07/12/2013 0841   ALKPHOS 80 04/18/2019 1246   ALKPHOS 95 05/22/2014 1111   AST 26 04/18/2019 1246   AST 29 05/22/2014 1111   ALT 19 04/18/2019 1246   ALT 17 05/22/2014 1111   BILITOT 0.8 04/18/2019 1246   BILITOT 0.8 05/22/2014 1111       RADIOGRAPHIC STUDIES: I have personally reviewed the radiological images as listed and agreed with the findings in the report. No results found.   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 STABLE.   # Continue Sandostatin on a monthly basis. Will plan imaging again in summer 2021.   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   # chronic mild diarrhea- ? Sec to carcinoid [not on xarmelo secondary insurance issues--stable.   # DISPOSITION- # Sandostatin today; No venofer.  # Follow up in 1 month; MD cbc/cmp; chromogranin-A/Sandstatin; possible venofer Dr.B   No orders of the defined types were placed in this encounter.  All questions were answered. The patient knows to call the clinic with any problems, questions or concerns.      [name], MD 04/18/2019 1:51 PM

## 2019-04-18 NOTE — Assessment & Plan Note (Addendum)
#   Non- functional Small bowel/mesenteric carcinoid; STAGE IV-on octreotide injections monthly' gallium PET scan January /June 2020 CT-stable/no evidence of obvious progression. STABLE.   # Continue Sandostatin on a monthly basis. Will plan imaging again in summer 2021.   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   # chronic mild diarrhea- ? Sec to carcinoid [not on xarmelo secondary insurance issues--stable.   # DISPOSITION- # Sandostatin today; No venofer.  # Follow up in 1 month; MD cbc/cmp; chromogranin-A/Sandstatin; possible venofer Dr.B

## 2019-05-16 ENCOUNTER — Other Ambulatory Visit: Payer: Self-pay

## 2019-05-16 ENCOUNTER — Inpatient Hospital Stay: Payer: Medicare Other | Attending: Internal Medicine

## 2019-05-16 ENCOUNTER — Inpatient Hospital Stay (HOSPITAL_BASED_OUTPATIENT_CLINIC_OR_DEPARTMENT_OTHER): Payer: Medicare Other | Admitting: Internal Medicine

## 2019-05-16 ENCOUNTER — Other Ambulatory Visit: Payer: Self-pay | Admitting: *Deleted

## 2019-05-16 ENCOUNTER — Inpatient Hospital Stay: Payer: Medicare Other

## 2019-05-16 DIAGNOSIS — E039 Hypothyroidism, unspecified: Secondary | ICD-10-CM | POA: Insufficient documentation

## 2019-05-16 DIAGNOSIS — D509 Iron deficiency anemia, unspecified: Secondary | ICD-10-CM | POA: Insufficient documentation

## 2019-05-16 DIAGNOSIS — C7B02 Secondary carcinoid tumors of liver: Secondary | ICD-10-CM | POA: Diagnosis not present

## 2019-05-16 DIAGNOSIS — I130 Hypertensive heart and chronic kidney disease with heart failure and stage 1 through stage 4 chronic kidney disease, or unspecified chronic kidney disease: Secondary | ICD-10-CM | POA: Diagnosis not present

## 2019-05-16 DIAGNOSIS — E340 Carcinoid syndrome: Secondary | ICD-10-CM | POA: Diagnosis not present

## 2019-05-16 DIAGNOSIS — C7A019 Malignant carcinoid tumor of the small intestine, unspecified portion: Secondary | ICD-10-CM | POA: Insufficient documentation

## 2019-05-16 DIAGNOSIS — N189 Chronic kidney disease, unspecified: Secondary | ICD-10-CM | POA: Diagnosis not present

## 2019-05-16 DIAGNOSIS — K6389 Other specified diseases of intestine: Secondary | ICD-10-CM

## 2019-05-16 LAB — COMPREHENSIVE METABOLIC PANEL
ALT: 17 U/L (ref 0–44)
AST: 25 U/L (ref 15–41)
Albumin: 3.9 g/dL (ref 3.5–5.0)
Alkaline Phosphatase: 81 U/L (ref 38–126)
Anion gap: 11 (ref 5–15)
BUN: 17 mg/dL (ref 8–23)
CO2: 25 mmol/L (ref 22–32)
Calcium: 9.1 mg/dL (ref 8.9–10.3)
Chloride: 103 mmol/L (ref 98–111)
Creatinine, Ser: 0.67 mg/dL (ref 0.44–1.00)
GFR calc Af Amer: >60 mL/min (ref >60)
GFR calc non Af Amer: >60 mL/min (ref >60)
Glucose, Bld: 119 mg/dL — ABNORMAL HIGH (ref 70–99)
Potassium: 3.4 mmol/L — ABNORMAL LOW (ref 3.5–5.1)
Sodium: 139 mmol/L (ref 135–145)
Total Bilirubin: 0.7 mg/dL (ref 0.3–1.2)
Total Protein: 6.9 g/dL (ref 6.5–8.1)

## 2019-05-16 LAB — CBC WITH DIFFERENTIAL/PLATELET
Abs Immature Granulocytes: 0.02 10*3/uL (ref 0.00–0.07)
Basophils Absolute: 0 10*3/uL (ref 0.0–0.1)
Basophils Relative: 1 %
Eosinophils Absolute: 0.3 10*3/uL (ref 0.0–0.5)
Eosinophils Relative: 5 %
HCT: 38 % (ref 36.0–46.0)
Hemoglobin: 12.6 g/dL (ref 12.0–15.0)
Immature Granulocytes: 0 %
Lymphocytes Relative: 9 %
Lymphs Abs: 0.6 10*3/uL — ABNORMAL LOW (ref 0.7–4.0)
MCH: 29.3 pg (ref 26.0–34.0)
MCHC: 33.2 g/dL (ref 30.0–36.0)
MCV: 88.4 fL (ref 80.0–100.0)
Monocytes Absolute: 0.8 10*3/uL (ref 0.1–1.0)
Monocytes Relative: 13 %
Neutro Abs: 4.7 10*3/uL (ref 1.7–7.7)
Neutrophils Relative %: 72 %
Platelets: 203 10*3/uL (ref 150–400)
RBC: 4.3 MIL/uL (ref 3.87–5.11)
RDW: 15.2 % (ref 11.5–15.5)
WBC: 6.5 10*3/uL (ref 4.0–10.5)
nRBC: 0 % (ref 0.0–0.2)

## 2019-05-16 MED ORDER — OCTREOTIDE ACETATE 30 MG IM KIT
30.0000 mg | PACK | Freq: Once | INTRAMUSCULAR | Status: AC
  Administered 2019-05-16: 30 mg via INTRAMUSCULAR
  Filled 2019-05-16: qty 1

## 2019-05-16 NOTE — Assessment & Plan Note (Addendum)
#   Non- functional Small bowel/mesenteric carcinoid; STAGE IV-on octreotide injections monthly' gallium PET scan January /June 2020 CT-stable/no evidence of obvious progression. STABLE.   # Continue Sandostatin on a monthly basis. Will order imaging at next visit for summer 2021.   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  Gunnar Fusi [id number]- Iron sat-9%]; s/p IV venofer- Hb 12.4; hold Venofer today.   # chronic mild diarrhea- ? Sec to carcinoid [not on xarmelo secondary insurance issues--stable.   # DISPOSITION- # Sandostatin today; No venofer.  # in 1 month- sandostatin; No labs # Follow up in 2  month; MD cbc/cmp; chromogranin-A/Sandstatin; possible venofer Dr.B

## 2019-05-16 NOTE — Progress Notes (Signed)
[facility] OFFICE PROGRESS NOTE  [name], [name] [name], [name] [name], [name] [name], [name]  Cancer Staging No matching staging information was found for the patient.   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   # FEB 2018- Ga PET- uptake in liver/ mesenteric mass; NOV 2019- increase sandostatin to 55m q monthly.   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  [name].  female pleasant patient above  history of metastatic carcinoid tumor currently on Sandostatin/iron deficient anemia question AV malformation is here for follow-up.  Patient denies any blood in stools or black or stools.  Continues to have chronic mild intermittent diarrhea.  Not any worse.  No weight loss.  No flushing.   Review of Systems  Constitutional: Positive for malaise/fatigue. Negative for chills, diaphoresis, fever and weight loss.  HENT: Negative for nosebleeds and sore throat.   Eyes: Negative for double vision.  Respiratory: Negative for hemoptysis, sputum production, shortness of breath and wheezing.   Cardiovascular: Positive for leg swelling. Negative for chest pain, palpitations and orthopnea.  Gastrointestinal: Positive for diarrhea. Negative for constipation, heartburn, melena, nausea and vomiting.  Genitourinary: Negative for dysuria, frequency and urgency.  Musculoskeletal: Negative for back pain and joint pain.  Skin: Negative.  Negative for itching and rash.  Neurological: Negative for dizziness, tingling, focal weakness, weakness and headaches.  Endo/Heme/Allergies: Does not bruise/bleed easily.  Psychiatric/Behavioral: Negative for depression. The patient is not nervous/anxious and does not have insomnia.       PAST MEDICAL HISTORY :  Past Medical History:  Diagnosis Date  . Aromatase inhibitor use   . Breast cancer ([facility] 2014   left breast cancer/radiation  . Breast cancer, left breast ([facility] 06/19/2014  . CHF (congestive heart failure) (HCC)    recent sepsis  . Chronic kidney disease   . Dizziness   . Dyspnea    recently while admitted  . GERD (gastroesophageal reflux disease)   . Hearing loss   . History of hiatal hernia   . History of kidney stones   . HOH (hard of hearing)  severe  . Hypertension   . Hypothyroidism   . Leg DVT (deep venous thromboembolism), acute ([place]) 07/2015   left leg after fracture  . 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 Pain    chest pain while admitted  .  Personal history of radiation therapy 2015   left breast ca  . Thyroid disease     PAST SURGICAL HISTORY :   Past Surgical History:  Procedure Laterality Date  . ABDOMINAL HYSTERECTOMY     partial  . BREAST LUMPECTOMY Left 01/13/2013   invasive mammary carcinoma, clear margins, LN negative  . BREAST LUMPECTOMY WITH SENTINEL LYMPH NODE BIOPSY Left 2014  . CHOLECYSTECTOMY    . COLONOSCOPY WITH PROPOFOL N/A 09/17/2017   Procedure: COLONOSCOPY WITH PROPOFOL;  Surgeon: Jonathon Bellows, MD;  Location: [facility] ENDOSCOPY;  Service: Gastroenterology;  Laterality: N/A;  . CYSTOSCOPY W/ URETERAL STENT PLACEMENT Right 01/07/2016   Procedure: CYSTOSCOPY WITH STENT REPLACEMENT;  Surgeon: Hollice Espy, MD;  Location: [facility] ORS;  Service: Urology;  Laterality: Right;  . CYSTOSCOPY WITH RETROGRADE PYELOGRAM, URETEROSCOPY AND STENT PLACEMENT Right 12/19/2015   Procedure: CYSTOSCOPY WITH RETROGRADE PYELOGRAM, URETEROSCOPY AND STENT PLACEMENT;  Surgeon: Ardis Hughs, MD;  Location: [facility] ORS;  Service: Urology;  Laterality: Right;  . URETEROSCOPY WITH HOLMIUM LASER LITHOTRIPSY Right 01/07/2016   Procedure: URETEROSCOPY WITH HOLMIUM LASER LITHOTRIPSY;  Surgeon: Hollice Espy, MD;  Location: [facility] ORS;  Service: Urology;  Laterality: Right;    FAMILY HISTORY :   Family History  Problem Relation Age of Onset  . Breast cancer Mother 50       deceased 38  . Breast cancer Maternal Aunt 68       deceased 49s  . Prostate cancer Father        deceased 29  . Colon cancer Paternal Aunt 80       deceased 33s    SOCIAL HISTORY:   Social History   Tobacco Use  . Smoking status: Never Smoker  . Smokeless tobacco: Never Used  Substance Use Topics  . Alcohol use: No    Alcohol/week: 0.0 standard drinks  . Drug use: No    ALLERGIES:  is allergic to sulfa antibiotics.  MEDICATIONS:  Current Outpatient Medications  Medication Sig Dispense Refill  . Calcium Carb-Cholecalciferol (CALCIUM 600 + D PO) Take 1  tablet by mouth daily.    . furosemide (LASIX) 40 MG tablet Take 40 mg by mouth daily as needed for fluid or edema.     . hyoscyamine (ANASPAZ) 0.125 MG TBDP disintergrating tablet Place 0.25 mg under the tongue every 4 (four) hours as needed for cramping.    . labetalol (NORMODYNE) 200 MG tablet Take 200 mg by mouth 2 (two) times daily.     Marland Kitchen levothyroxine (SYNTHROID) 88 MCG tablet Take 88 mcg by mouth daily before breakfast.     . losartan-hydrochlorothiazide (HYZAAR) 100-25 MG tablet Take 1 tablet by mouth daily.    Marland Kitchen nystatin (MYCOSTATIN/NYSTOP) powder APPLY TO AFFECTED AREA TWICE A DAY 15 g 3  . octreotide (SANDOSTATIN LAR) 30 MG injection Inject 30 mg into the muscle every 28 (twenty-eight) days.     . potassium chloride SA (KLOR-CON) 20 MEQ tablet 1 pill  A day. 60 tablet 3  . Vitamin D, Ergocalciferol, (DRISDOL) 1.25 MG (50000 UNIT) CAPS capsule Take 1 capsule (50,000 Units total) by mouth once a week. 12 capsule 3   No current facility-administered medications for this visit.    PHYSICAL EXAMINATION: ECOG PERFORMANCE STATUS: 1 -  Symptomatic but completely ambulatory  BP (!) 147/65   Pulse (!) 54   Temp (!) 96.3 F (35.7 C) (Tympanic)   Resp 20   Wt 222 lb (100.7 kg)   BMI 39.33 kg/m   Filed Weights   05/16/19 1344  Weight: 222 lb (100.7 kg)    Physical Exam  Constitutional: She is oriented to person, place, and time and well-developed, well-nourished, and in no distress.  Patient is a wheelchair.  She is alone.  Patient is hard of hearing.  HENT:  Head: Normocephalic and atraumatic.  Mouth/Throat: Oropharynx is clear and moist. No oropharyngeal exudate.  Eyes: Pupils are equal, round, and reactive to light.  Cardiovascular: Normal rate and regular rhythm.  Pulmonary/Chest: Effort normal and breath sounds normal. No respiratory distress. She has no wheezes.  Abdominal: Soft. Bowel sounds are normal. She exhibits no distension and no mass. There is no abdominal  tenderness. There is no rebound and no guarding.  Musculoskeletal:        General: No tenderness. Normal range of motion.     Cervical back: Normal range of motion and neck supple.  Neurological: She is alert and oriented to person, place, and time.  Skin: Skin is warm.  Psychiatric: Affect normal.    LABORATORY DATA:  I have reviewed the data as listed    Component Value Date/Time   NA 139 05/16/2019 1314   NA 137 07/12/2013 0841   K 3.4 (L) 05/16/2019 1314   K 4.7 07/12/2013 0841   CL 103 05/16/2019 1314   CL 107 07/12/2013 0841   CO2 25 05/16/2019 1314   CO2 20 (L) 07/12/2013 0841   GLUCOSE 119 (H) 05/16/2019 1314   GLUCOSE 122 (H) 07/12/2013 0841   BUN 17 05/16/2019 1314   BUN 18 07/12/2013 0841   CREATININE 0.67 05/16/2019 1314   CREATININE 0.64 05/22/2014 1111   CALCIUM 9.1 05/16/2019 1314   CALCIUM 9.2 07/12/2013 0841   PROT 6.9 05/16/2019 1314   PROT 7.0 05/22/2014 1111   ALBUMIN 3.9 05/16/2019 1314   ALBUMIN 4.2 05/22/2014 1111   AST 25 05/16/2019 1314   AST 29 05/22/2014 1111   ALT 17 05/16/2019 1314   ALT 17 05/22/2014 1111   ALKPHOS 81 05/16/2019 1314   ALKPHOS 95 05/22/2014 1111   BILITOT 0.7 05/16/2019 1314   BILITOT 0.8 05/22/2014 1111   GFRNONAA >60 05/16/2019 1314   GFRNONAA >60 05/22/2014 1111   GFRAA >60 05/16/2019 1314   GFRAA >60 05/22/2014 1111    No results found for: SPEP, UPEP  Lab Results  Component Value Date   WBC 6.5 05/16/2019   NEUTROABS 4.7 05/16/2019   HGB 12.6 05/16/2019   HCT 38.0 05/16/2019   MCV 88.4 05/16/2019   PLT 203 05/16/2019      Chemistry      Component Value Date/Time   NA 139 05/16/2019 1314   NA 137 07/12/2013 0841   K 3.4 (L) 05/16/2019 1314   K 4.7 07/12/2013 0841   CL 103 05/16/2019 1314   CL 107 07/12/2013 0841   CO2 25 05/16/2019 1314   CO2 20 (L) 07/12/2013 0841   BUN 17 05/16/2019 1314   BUN 18 07/12/2013 0841   CREATININE 0.67 05/16/2019 1314   CREATININE 0.64 05/22/2014 1111       Component Value Date/Time   CALCIUM 9.1 05/16/2019 1314   CALCIUM 9.2 07/12/2013 0841   ALKPHOS 81 05/16/2019 1314   ALKPHOS 95 05/22/2014 1111   AST  25 05/16/2019 1314   AST 29 05/22/2014 1111   ALT 17 05/16/2019 1314   ALT 17 05/22/2014 1111   BILITOT 0.7 05/16/2019 1314   BILITOT 0.8 05/22/2014 1111       RADIOGRAPHIC STUDIES: I have personally reviewed the radiological images as listed and agreed with the findings in the report. No results found.   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 STABLE.   # Continue Sandostatin on a monthly basis. Will order imaging at next visit for summer 2021.   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  Gunnar Fusi [id number]- Iron sat-9%]; s/p IV venofer- Hb 12.4; hold Venofer today.   # chronic mild diarrhea- ? Sec to carcinoid [not on xarmelo secondary insurance issues--stable.   # DISPOSITION- # Sandostatin today; No venofer.  # in 1 month- sandostatin; No labs # Follow up in 2  month; MD cbc/cmp; chromogranin-A/Sandstatin; possible venofer Dr.B   Orders Placed This Encounter  Procedures  . Comprehensive metabolic panel    Standing Status:   Future    Standing Expiration Date:   05/15/2020  . CBC with Differential    Standing Status:   Future    Standing Expiration Date:   05/15/2020  . Chromogranin A    Standing Status:   Future    Standing Expiration Date:   05/15/2020   All questions were answered. The patient knows to call the clinic with any problems, questions or concerns.      [name], MD 05/16/2019 2:58 PM

## 2019-05-17 LAB — CHROMOGRANIN A: Chromogranin A (ng/mL): 81.9 ng/mL (ref 0.0–101.8)

## 2019-06-09 ENCOUNTER — Other Ambulatory Visit: Payer: Self-pay | Admitting: Internal Medicine

## 2019-06-13 ENCOUNTER — Other Ambulatory Visit: Payer: Self-pay

## 2019-06-13 ENCOUNTER — Inpatient Hospital Stay: Payer: Medicare Other | Attending: Internal Medicine

## 2019-06-13 DIAGNOSIS — C7A019 Malignant carcinoid tumor of the small intestine, unspecified portion: Secondary | ICD-10-CM | POA: Insufficient documentation

## 2019-06-13 DIAGNOSIS — C7B02 Secondary carcinoid tumors of liver: Secondary | ICD-10-CM | POA: Insufficient documentation

## 2019-06-13 DIAGNOSIS — K6389 Other specified diseases of intestine: Secondary | ICD-10-CM

## 2019-06-13 DIAGNOSIS — E340 Carcinoid syndrome: Secondary | ICD-10-CM | POA: Diagnosis not present

## 2019-06-13 MED ORDER — OCTREOTIDE ACETATE 30 MG IM KIT
30.0000 mg | PACK | Freq: Once | INTRAMUSCULAR | Status: AC
  Administered 2019-06-13: 30 mg via INTRAMUSCULAR
  Filled 2019-06-13: qty 1

## 2019-07-11 ENCOUNTER — Inpatient Hospital Stay: Payer: Medicare Other | Attending: Internal Medicine

## 2019-07-11 ENCOUNTER — Other Ambulatory Visit: Payer: Self-pay

## 2019-07-11 ENCOUNTER — Inpatient Hospital Stay: Payer: Medicare Other

## 2019-07-11 ENCOUNTER — Inpatient Hospital Stay (HOSPITAL_BASED_OUTPATIENT_CLINIC_OR_DEPARTMENT_OTHER): Payer: Medicare Other | Admitting: Internal Medicine

## 2019-07-11 ENCOUNTER — Encounter: Payer: Self-pay | Admitting: Internal Medicine

## 2019-07-11 DIAGNOSIS — E039 Hypothyroidism, unspecified: Secondary | ICD-10-CM | POA: Diagnosis not present

## 2019-07-11 DIAGNOSIS — C7A019 Malignant carcinoid tumor of the small intestine, unspecified portion: Secondary | ICD-10-CM | POA: Diagnosis present

## 2019-07-11 DIAGNOSIS — K6389 Other specified diseases of intestine: Secondary | ICD-10-CM

## 2019-07-11 DIAGNOSIS — C7B02 Secondary carcinoid tumors of liver: Secondary | ICD-10-CM | POA: Diagnosis not present

## 2019-07-11 DIAGNOSIS — D509 Iron deficiency anemia, unspecified: Secondary | ICD-10-CM | POA: Diagnosis not present

## 2019-07-11 DIAGNOSIS — E340 Carcinoid syndrome: Secondary | ICD-10-CM | POA: Insufficient documentation

## 2019-07-11 LAB — CBC WITH DIFFERENTIAL/PLATELET
Abs Immature Granulocytes: 0.01 10*3/uL (ref 0.00–0.07)
Basophils Absolute: 0.1 10*3/uL (ref 0.0–0.1)
Basophils Relative: 1 %
Eosinophils Absolute: 0.4 10*3/uL (ref 0.0–0.5)
Eosinophils Relative: 6 %
HCT: 36.9 % (ref 36.0–46.0)
Hemoglobin: 12.3 g/dL (ref 12.0–15.0)
Immature Granulocytes: 0 %
Lymphocytes Relative: 11 %
Lymphs Abs: 0.6 10*3/uL — ABNORMAL LOW (ref 0.7–4.0)
MCH: 29.7 pg (ref 26.0–34.0)
MCHC: 33.3 g/dL (ref 30.0–36.0)
MCV: 89.1 fL (ref 80.0–100.0)
Monocytes Absolute: 0.8 10*3/uL (ref 0.1–1.0)
Monocytes Relative: 13 %
Neutro Abs: 4 10*3/uL (ref 1.7–7.7)
Neutrophils Relative %: 69 %
Platelets: 187 10*3/uL (ref 150–400)
RBC: 4.14 MIL/uL (ref 3.87–5.11)
RDW: 14.4 % (ref 11.5–15.5)
WBC: 5.8 10*3/uL (ref 4.0–10.5)
nRBC: 0 % (ref 0.0–0.2)

## 2019-07-11 LAB — COMPREHENSIVE METABOLIC PANEL
ALT: 19 U/L (ref 0–44)
AST: 27 U/L (ref 15–41)
Albumin: 3.8 g/dL (ref 3.5–5.0)
Alkaline Phosphatase: 75 U/L (ref 38–126)
Anion gap: 12 (ref 5–15)
BUN: 15 mg/dL (ref 8–23)
CO2: 27 mmol/L (ref 22–32)
Calcium: 9.1 mg/dL (ref 8.9–10.3)
Chloride: 100 mmol/L (ref 98–111)
Creatinine, Ser: 0.82 mg/dL (ref 0.44–1.00)
GFR calc Af Amer: >60 mL/min (ref >60)
GFR calc non Af Amer: >60 mL/min (ref >60)
Glucose, Bld: 131 mg/dL — ABNORMAL HIGH (ref 70–99)
Potassium: 3.4 mmol/L — ABNORMAL LOW (ref 3.5–5.1)
Sodium: 139 mmol/L (ref 135–145)
Total Bilirubin: 1.1 mg/dL (ref 0.3–1.2)
Total Protein: 6.8 g/dL (ref 6.5–8.1)

## 2019-07-11 MED ORDER — OCTREOTIDE ACETATE 30 MG IM KIT
30.0000 mg | PACK | Freq: Once | INTRAMUSCULAR | Status: AC
  Administered 2019-07-11: 30 mg via INTRAMUSCULAR
  Filled 2019-07-11: qty 1

## 2019-07-11 NOTE — Assessment & Plan Note (Addendum)
#   Non- functional Small bowel/mesenteric carcinoid; STAGE IV-on octreotide injections monthly; Jan 2020-gallium PET scan January /May 2020 CT-stable/no evidence of obvious progression; however worsening intermittent abdominal pain-see below.   # Continue Sandostatin on a monthly basis; would recommend gallium PET scan-given the intermittent worsening abdominal pain.  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  Gunnar Fusi [id number]- Iron sat-9%]; s/p IV venofer- Hb 12.4; hold Venofer.  # chronic mild diarrhea- ? Sec to carcinoid [not on xarmelo secondary insurance issues--stable.   # DISPOSITION- # Sandostatin today; No venofer.  # Follow up in 1  month; MD cbc/cmp; -Sandstatin; possible venofer; ga-PET prior- Dr.B

## 2019-07-11 NOTE — Progress Notes (Signed)
[place] OFFICE PROGRESS NOTE  [name], [name] [name], [name] [name], [name] [name], [name]  Cancer Staging No matching staging information was found for the patient.   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   # FEB 2018- Ga PET- uptake in liver/ mesenteric mass; NOV 2019- increase sandostatin to 21m q monthly.   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  [name].  female pleasant patient above  history of metastatic carcinoid tumor currently on Sandostatin/iron deficient anemia question AV malformation is here for follow-up.  Patient complains of intermittent abdominal pain generalized.  Also complains of intermittent blood in stools.  Chronic mild diarrhea.  Not any worse.  No weight loss no flushing.  Swelling in the legs chronic.   Review of Systems  Constitutional: Positive for malaise/fatigue. Negative for chills, diaphoresis, fever and weight loss.  HENT: Negative for nosebleeds and sore throat.   Eyes: Negative for double vision.  Respiratory: Negative for hemoptysis, sputum production, shortness of breath and wheezing.   Cardiovascular: Positive for leg swelling. Negative for chest pain, palpitations and orthopnea.  Gastrointestinal: Positive for diarrhea. Negative for constipation, heartburn, melena, nausea and vomiting.  Genitourinary: Negative for dysuria, frequency and urgency.  Musculoskeletal: Negative for back pain and joint pain.  Skin: Negative.  Negative for itching and rash.  Neurological: Negative for dizziness, tingling, focal weakness, weakness and headaches.  Endo/Heme/Allergies: Does not bruise/bleed easily.  Psychiatric/Behavioral: Negative for depression. The patient is not nervous/anxious and does not have insomnia.       PAST MEDICAL HISTORY :  Past Medical History:  Diagnosis Date  . Aromatase inhibitor use   . Breast cancer ([place]   left breast cancer/radiation  . Breast cancer, [place] breast ([place] 06/[place]  . CHF (congestive heart failure) (HCC)    recent sepsis  . Chronic kidney disease   . Dizziness   . Dyspnea    recently while admitted  . GERD (gastroesophageal reflux disease)   . Hearing loss   . History of hiatal hernia   . History of kidney stones   .  HOH (hard of hearing)    severe  . Hypertension   . Hypothyroidism   . Leg DVT (deep venous thromboembolism), acute ([place]) 07/2015   left leg after fracture  . Mesenteric mass  06/19/2014  . Pain    chest pain while admitted  . Personal history of radiation therapy 2015   left breast ca  . Thyroid disease     PAST SURGICAL HISTORY :   Past Surgical History:  Procedure Laterality Date  . ABDOMINAL HYSTERECTOMY     partial  . BREAST LUMPECTOMY Left 01/13/2013   invasive mammary carcinoma, clear margins, LN negative  . BREAST LUMPECTOMY WITH SENTINEL LYMPH NODE BIOPSY Left 2014  . CHOLECYSTECTOMY    . COLONOSCOPY WITH PROPOFOL N/A 09/17/2017   Procedure: COLONOSCOPY WITH PROPOFOL;  Surgeon: Jonathon Bellows, MD;  Location[facility];  Service: Gastroenterology;  Laterality: N/A;  . CYSTOSCOPY W/ URETERAL STENT PLACEMENT Right 01/07/2016   Procedure: CYSTOSCOPY WITH STENT REPLACEMENT;  Surgeon: Hollice Espy, MD;  Location: [facility] ORS;  Service: Urology;  Laterality: Right;  . CYSTOSCOPY WITH RETROGRADE PYELOGRAM, URETEROSCOPY AND STENT PLACEMENT Right 12/19/2015   Procedure: CYSTOSCOPY WITH RETROGRADE PYELOGRAM, URETEROSCOPY AND STENT PLACEMENT;  Surgeon: Ardis Hughs, MD;  Location: [facility] ORS;  Service: Urology;  Laterality: Right;  . URETEROSCOPY WITH HOLMIUM LASER LITHOTRIPSY Right 01/07/2016   Procedure: URETEROSCOPY WITH HOLMIUM LASER LITHOTRIPSY;  Surgeon: Hollice Espy, MD;  Location: [facility] ORS;  Service: Urology;  Laterality: Right;    FAMILY HISTORY :   Family History  Problem Relation Age of Onset  . Breast cancer Mother 82       deceased 48  . Breast cancer Maternal Aunt 68       deceased 76s  . Prostate cancer Father        deceased 11  . Colon cancer Paternal Aunt 80       deceased 5s    SOCIAL HISTORY:   Social History   Tobacco Use  . Smoking status: Never Smoker  . Smokeless tobacco: Never Used  Substance Use Topics  . Alcohol use: No    Alcohol/week: 0.0 standard drinks  . Drug use: No    ALLERGIES:  is allergic to sulfa antibiotics.  MEDICATIONS:  Current Outpatient Medications  Medication Sig Dispense Refill  .  Calcium Carb-Cholecalciferol (CALCIUM 600 + D PO) Take 1 tablet by mouth daily.    . furosemide (LASIX) 40 MG tablet Take 40 mg by mouth daily as needed for fluid or edema.     . hyoscyamine (ANASPAZ) 0.125 MG TBDP disintergrating tablet Place 0.25 mg under the tongue every 4 (four) hours as needed for cramping.    . labetalol (NORMODYNE) 200 MG tablet Take 200 mg by mouth 2 (two) times daily.     Marland Kitchen levothyroxine (SYNTHROID) 88 MCG tablet Take 88 mcg by mouth daily before breakfast.     . losartan-hydrochlorothiazide (HYZAAR) 100-25 MG tablet Take 1 tablet by mouth daily.    Marland Kitchen nystatin (MYCOSTATIN/NYSTOP) powder APPLY TO AFFECTED AREA TWICE A DAY 15 g 3  . octreotide (SANDOSTATIN LAR) 30 MG injection Inject 30 mg into the muscle every 28 (twenty-eight) days.     . potassium chloride SA (KLOR-CON M20) 20 MEQ tablet TAKE 1 TABLET BY MOUTH EVERY DAY 90 tablet 2  . Vitamin D, Ergocalciferol, (DRISDOL) 1.25 MG (50000 UNIT) CAPS capsule Take 1 capsule (50,000 Units total) by mouth once a week. 12 capsule 3   No current facility-administered medications for this  visit.   Facility-Administered Medications Ordered in Other Visits  Medication Dose Route Frequency Provider Last Rate Last Admin  . octreotide (SANDOSTATIN LAR) IM injection 30 mg  30 mg Intramuscular Once [name], MD        PHYSICAL EXAMINATION: ECOG PERFORMANCE STATUS: 1 - Symptomatic but completely ambulatory  BP 130/67   Pulse (!) 53   Temp 97.8 F (36.6 C)   Ht 5' 3"  (1.6 m)   Wt 225 lb (102.1 kg)   BMI 39.86 kg/m   Filed Weights   07/11/19 1025  Weight: 225 lb (102.1 kg)    Physical Exam Constitutional:      Comments: Patient is a wheelchair.  She is alone.  Patient is hard of hearing.  HENT:     Head: Normocephalic and atraumatic.     Mouth/Throat:     Pharynx: No oropharyngeal exudate.  Eyes:     Pupils: Pupils are equal, round, and reactive to light.  Cardiovascular:     Rate and Rhythm: Normal  rate and regular rhythm.  Pulmonary:     Effort: Pulmonary effort is normal. No respiratory distress.     Breath sounds: Normal breath sounds. No wheezing.  Abdominal:     General: Bowel sounds are normal. There is no distension.     Palpations: Abdomen is soft. There is no mass.     Tenderness: There is no abdominal tenderness. There is no guarding or rebound.  Musculoskeletal:        General: Swelling present. No tenderness. Normal range of motion.     Cervical back: Normal range of motion and neck supple.  Skin:    General: Skin is warm.  Neurological:     Mental Status: She is alert and oriented to person, place, and time.  Psychiatric:        Mood and Affect: Affect normal.     LABORATORY DATA:  I have reviewed the data as listed    Component Value Date/Time   NA 139 07/11/2019 1008   NA 137 07/12/2013 0841   K 3.4 (L) 07/11/2019 1008   K 4.7 07/12/2013 0841   CL 100 07/11/2019 1008   CL 107 07/12/2013 0841   CO2 27 07/11/2019 1008   CO2 20 (L) 07/12/2013 0841   GLUCOSE 131 (H) 07/11/2019 1008   GLUCOSE 122 (H) 07/12/2013 0841   BUN 15 07/11/2019 1008   BUN 18 07/12/2013 0841   CREATININE 0.82 07/11/2019 1008   CREATININE 0.64 05/22/2014 1111   CALCIUM 9.1 07/11/2019 1008   CALCIUM 9.2 07/12/2013 0841   PROT 6.8 07/11/2019 1008   PROT 7.0 05/22/2014 1111   ALBUMIN 3.8 07/11/2019 1008   ALBUMIN 4.2 05/22/2014 1111   AST 27 07/11/2019 1008   AST 29 05/22/2014 1111   ALT 19 07/11/2019 1008   ALT 17 05/22/2014 1111   ALKPHOS 75 07/11/2019 1008   ALKPHOS 95 05/22/2014 1111   BILITOT 1.1 07/11/2019 1008   BILITOT 0.8 05/22/2014 1111   GFRNONAA >60 07/11/2019 1008   GFRNONAA >60 05/22/2014 1111   GFRAA >60 07/11/2019 1008   GFRAA >60 05/22/2014 1111    No results found for: SPEP, UPEP  Lab Results  Component Value Date   WBC 5.8 07/11/2019   NEUTROABS 4.0 07/11/2019   HGB 12.3 07/11/2019   HCT 36.9 07/11/2019   MCV 89.1 07/11/2019   PLT 187 07/11/2019       Chemistry      Component Value Date/Time  NA 139 07/11/2019 1008   NA 137 07/12/2013 0841   K 3.4 (L) 07/11/2019 1008   K 4.7 07/12/2013 0841   CL 100 07/11/2019 1008   CL 107 07/12/2013 0841   CO2 27 07/11/2019 1008   CO2 20 (L) 07/12/2013 0841   BUN 15 07/11/2019 1008   BUN 18 07/12/2013 0841   CREATININE 0.82 07/11/2019 1008   CREATININE 0.64 05/22/2014 1111      Component Value Date/Time   CALCIUM 9.1 07/11/2019 1008   CALCIUM 9.2 07/12/2013 0841   ALKPHOS 75 07/11/2019 1008   ALKPHOS 95 05/22/2014 1111   AST 27 07/11/2019 1008   AST 29 05/22/2014 1111   ALT 19 07/11/2019 1008   ALT 17 05/22/2014 1111   BILITOT 1.1 07/11/2019 1008   BILITOT 0.8 05/22/2014 1111       RADIOGRAPHIC STUDIES: I have personally reviewed the radiological images as listed and agreed with the findings in the report. No results found.   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   # Continue Sandostatin on a monthly basis; would recommend gallium PET scan-given the intermittent worsening abdominal pain.  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  Gunnar Fusi [id number]- Iron sat-9%]; s/p IV venofer- Hb 12.4; hold Venofer.  # chronic mild diarrhea- ? Sec to carcinoid [not on xarmelo secondary insurance issues--stable.   # DISPOSITION- # Sandostatin today; No venofer.  # Follow up in 1  month; MD cbc/cmp; -Sandstatin; possible venofer; ga-PET prior- Dr.B   Orders Placed This Encounter  Procedures  . NM PET (NETSPOT GA 72 DOTATATE) SKULL BASE TO MID THIGH    Standing Status:   Future    Standing Expiration Date:   07/10/2020    Order Specific Question:   ** REASON FOR EXAM (FREE TEXT)    Answer:   carcinoid  tumor- metastatic    Order Specific Question:   If indicated for the ordered procedure, I authorize the administration of a radiopharmaceutical per Radiology protocol    Answer:   Yes    Order Specific Question:   Preferred imaging location?    Answer:   [facility]    Order Specific Question:   Radiology Contrast Protocol - do NOT remove file path    Answer:   [email or web address]   All questions were answered. The patient knows to call the clinic with any problems, questions or concerns.      [name], MD 07/11/2019 10:55 AM

## 2019-07-12 LAB — CHROMOGRANIN A: Chromogranin A (ng/mL): 76.5 ng/mL (ref 0.0–101.8)

## 2019-08-04 ENCOUNTER — Other Ambulatory Visit: Payer: Self-pay

## 2019-08-04 ENCOUNTER — Encounter
Admission: RE | Admit: 2019-08-04 | Discharge: 2019-08-04 | Disposition: A | Discharge status: Home / Self Care | Payer: Medicare Other | Source: Ambulatory Visit | Attending: Internal Medicine | Admitting: Internal Medicine

## 2019-08-04 DIAGNOSIS — C7B02 Secondary carcinoid tumors of liver: Secondary | ICD-10-CM | POA: Diagnosis present

## 2019-08-04 MED ORDER — GALLIUM GA 68 DOTATATE IV KIT
5.7700 | PACK | Freq: Once | INTRAVENOUS | Status: AC | PRN
  Administered 2019-08-04: 5.77 via INTRAVENOUS

## 2019-08-05 ENCOUNTER — Other Ambulatory Visit: Payer: Self-pay | Admitting: *Deleted

## 2019-08-05 DIAGNOSIS — C7B02 Secondary carcinoid tumors of liver: Secondary | ICD-10-CM

## 2019-08-08 ENCOUNTER — Telehealth: Payer: Self-pay | Admitting: *Deleted

## 2019-08-08 ENCOUNTER — Other Ambulatory Visit (HOSPITAL_COMMUNITY): Payer: Self-pay | Admitting: Internal Medicine

## 2019-08-08 ENCOUNTER — Encounter: Payer: Self-pay | Admitting: Internal Medicine

## 2019-08-08 ENCOUNTER — Inpatient Hospital Stay: Payer: Medicare Other | Attending: Internal Medicine

## 2019-08-08 ENCOUNTER — Inpatient Hospital Stay: Payer: Medicare Other

## 2019-08-08 ENCOUNTER — Encounter: Payer: Self-pay | Admitting: *Deleted

## 2019-08-08 ENCOUNTER — Inpatient Hospital Stay (HOSPITAL_BASED_OUTPATIENT_CLINIC_OR_DEPARTMENT_OTHER): Payer: Medicare Other | Admitting: Internal Medicine

## 2019-08-08 ENCOUNTER — Telehealth: Payer: Self-pay | Admitting: Internal Medicine

## 2019-08-08 ENCOUNTER — Other Ambulatory Visit: Payer: Self-pay

## 2019-08-08 DIAGNOSIS — C7B02 Secondary carcinoid tumors of liver: Secondary | ICD-10-CM

## 2019-08-08 DIAGNOSIS — R197 Diarrhea, unspecified: Secondary | ICD-10-CM | POA: Insufficient documentation

## 2019-08-08 DIAGNOSIS — K6389 Other specified diseases of intestine: Secondary | ICD-10-CM

## 2019-08-08 DIAGNOSIS — D509 Iron deficiency anemia, unspecified: Secondary | ICD-10-CM | POA: Insufficient documentation

## 2019-08-08 DIAGNOSIS — C7A019 Malignant carcinoid tumor of the small intestine, unspecified portion: Secondary | ICD-10-CM | POA: Diagnosis present

## 2019-08-08 DIAGNOSIS — C7B8 Other secondary neuroendocrine tumors: Secondary | ICD-10-CM

## 2019-08-08 LAB — COMPREHENSIVE METABOLIC PANEL
ALT: 18 U/L (ref 0–44)
AST: 27 U/L (ref 15–41)
Albumin: 3.9 g/dL (ref 3.5–5.0)
Alkaline Phosphatase: 78 U/L (ref 38–126)
Anion gap: 10 (ref 5–15)
BUN: 16 mg/dL (ref 8–23)
CO2: 25 mmol/L (ref 22–32)
Calcium: 9 mg/dL (ref 8.9–10.3)
Chloride: 102 mmol/L (ref 98–111)
Creatinine, Ser: 0.78 mg/dL (ref 0.44–1.00)
GFR calc Af Amer: >60 mL/min (ref >60)
GFR calc non Af Amer: >60 mL/min (ref >60)
Glucose, Bld: 126 mg/dL — ABNORMAL HIGH (ref 70–99)
Potassium: 3.6 mmol/L (ref 3.5–5.1)
Sodium: 137 mmol/L (ref 135–145)
Total Bilirubin: 1 mg/dL (ref 0.3–1.2)
Total Protein: 6.9 g/dL (ref 6.5–8.1)

## 2019-08-08 LAB — CBC WITH DIFFERENTIAL/PLATELET
Abs Immature Granulocytes: 0.02 10*3/uL (ref 0.00–0.07)
Basophils Absolute: 0 10*3/uL (ref 0.0–0.1)
Basophils Relative: 1 %
Eosinophils Absolute: 0.4 10*3/uL (ref 0.0–0.5)
Eosinophils Relative: 6 %
HCT: 36.7 % (ref 36.0–46.0)
Hemoglobin: 12.5 g/dL (ref 12.0–15.0)
Immature Granulocytes: 0 %
Lymphocytes Relative: 13 %
Lymphs Abs: 0.8 10*3/uL (ref 0.7–4.0)
MCH: 29.8 pg (ref 26.0–34.0)
MCHC: 34.1 g/dL (ref 30.0–36.0)
MCV: 87.6 fL (ref 80.0–100.0)
Monocytes Absolute: 0.7 10*3/uL (ref 0.1–1.0)
Monocytes Relative: 12 %
Neutro Abs: 4.1 10*3/uL (ref 1.7–7.7)
Neutrophils Relative %: 68 %
Platelets: 187 10*3/uL (ref 150–400)
RBC: 4.19 MIL/uL (ref 3.87–5.11)
RDW: 14.2 % (ref 11.5–15.5)
WBC: 6.1 10*3/uL (ref 4.0–10.5)
nRBC: 0 % (ref 0.0–0.2)

## 2019-08-08 MED ORDER — OCTREOTIDE ACETATE 30 MG IM KIT
30.0000 mg | PACK | Freq: Once | INTRAMUSCULAR | Status: AC
  Administered 2019-08-08: 30 mg via INTRAMUSCULAR
  Filled 2019-08-08: qty 1

## 2019-08-08 NOTE — Progress Notes (Signed)
[place] OFFICE PROGRESS NOTE  [name], [name] [name], [name] [name], [name] [name], [name]  Cancer Staging No matching staging information was found for the patient.   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   # FEB 2018- Ga PET- uptake in liver/ mesenteric mass; NOV 2019- increase sandostatin to 46m q monthly.   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  [name].  female pleasant patient above  history of metastatic carcinoid tumor currently on Sandostatin/iron deficient anemia question AV malformation is here for follow-up.  Patient continues to have intermittent abdominal pain.  Intermittent blood in stools.  Chronic mild to moderate diarrhea.  Not any worse.  No weight loss but no flushing.  Chronic mild swelling in the legs not any worse.  Review of Systems  Constitutional: Positive for malaise/fatigue. Negative for chills, diaphoresis, fever and weight loss.  HENT: Negative for nosebleeds and sore throat.   Eyes: Negative for double vision.  Respiratory: Negative for hemoptysis, sputum production, shortness of breath and wheezing.   Cardiovascular: Positive for leg swelling. Negative for chest pain, palpitations and orthopnea.  Gastrointestinal: Positive for diarrhea. Negative for constipation, heartburn, melena, nausea and vomiting.  Genitourinary: Negative for dysuria, frequency and urgency.  Musculoskeletal: Negative for back pain and joint pain.  Skin: Negative.  Negative for itching and rash.  Neurological: Negative for dizziness, tingling, focal weakness, weakness and headaches.  Endo/Heme/Allergies: Does not bruise/bleed easily.  Psychiatric/Behavioral: Negative for depression. The patient is not nervous/anxious and does not have insomnia.       PAST MEDICAL HISTORY :  Past Medical History:  Diagnosis Date  . Aromatase inhibitor use   . Breast [place]   left breast cancer/radiation  . [place] [place]  . CHF (congestive heart failure) (HCC)    recent sepsis  . Chronic kidney disease   . Dizziness   . Dyspnea    recently while admitted  . GERD (gastroesophageal reflux disease)   . Hearing loss   . History of hiatal hernia   . History of  kidney stones   . HOH (hard of hearing)    severe  . Hypertension   . Hypothyroidism   . Leg DVT (deep venous thromboembolism), acute ([place]) 07/2015   left leg after fracture  .  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 Pain    chest pain while admitted  . Personal history of radiation therapy 2015   left breast ca  . Thyroid disease     PAST SURGICAL HISTORY :   Past Surgical History:  Procedure Laterality Date  . ABDOMINAL HYSTERECTOMY     partial  . BREAST LUMPECTOMY Left 01/13/2013   invasive mammary carcinoma, clear margins, LN negative  . BREAST LUMPECTOMY WITH SENTINEL LYMPH NODE BIOPSY Left 2014  . CHOLECYSTECTOMY    . COLONOSCOPY WITH PROPOFOL N/A 09/17/2017   Procedure: COLONOSCOPY WITH PROPOFOL;  Surgeon: Jonathon Bellows, MD;  Location: [facility] ENDOSCOPY;  Service: Gastroenterology;  Laterality: N/A;  . CYSTOSCOPY W/ URETERAL STENT PLACEMENT Right 01/07/2016   Procedure: CYSTOSCOPY WITH STENT REPLACEMENT;  Surgeon: Hollice Espy, MD;  Location: [facility] ORS;  Service: Urology;  Laterality: Right;  . CYSTOSCOPY WITH RETROGRADE PYELOGRAM, URETEROSCOPY AND STENT PLACEMENT Right 12/19/2015   Procedure: CYSTOSCOPY WITH RETROGRADE PYELOGRAM, URETEROSCOPY AND STENT PLACEMENT;  Surgeon: Ardis Hughs, MD;  Location: [facility] ORS;  Service: Urology;  Laterality: Right;  . URETEROSCOPY WITH HOLMIUM LASER LITHOTRIPSY Right 01/07/2016   Procedure: URETEROSCOPY WITH HOLMIUM LASER LITHOTRIPSY;  Surgeon: Hollice Espy, MD;  Location: [facility] ORS;  Service: Urology;  Laterality: Right;    FAMILY HISTORY :   Family History  Problem Relation Age of Onset  . Breast cancer Mother 107       deceased 31  . Breast cancer Maternal Aunt 68       deceased 60s  . Prostate cancer Father        deceased 32  . Colon cancer Paternal Aunt 80       deceased 28s    SOCIAL HISTORY:   Social History   Tobacco Use  . Smoking status: Never Smoker  . Smokeless tobacco: Never Used  Substance Use Topics  . Alcohol use: No    Alcohol/week: 0.0 standard drinks  . Drug use: No    ALLERGIES:  is allergic to sulfa antibiotics.  MEDICATIONS:  Current Outpatient Medications  Medication Sig  Dispense Refill  . Calcium Carb-Cholecalciferol (CALCIUM 600 + D PO) Take 1 tablet by mouth daily.    . furosemide (LASIX) 40 MG tablet Take 40 mg by mouth daily as needed for fluid or edema.     . hyoscyamine (ANASPAZ) 0.125 MG TBDP disintergrating tablet Place 0.25 mg under the tongue every 4 (four) hours as needed for cramping.    . labetalol (NORMODYNE) 200 MG tablet Take 200 mg by mouth 2 (two) times daily.     Marland Kitchen levothyroxine (SYNTHROID) 88 MCG tablet Take 88 mcg by mouth daily before breakfast.     . losartan-hydrochlorothiazide (HYZAAR) 100-25 MG tablet Take 1 tablet by mouth daily.    Marland Kitchen nystatin (MYCOSTATIN/NYSTOP) powder APPLY TO AFFECTED AREA TWICE A DAY 15 g 3  . octreotide (SANDOSTATIN LAR) 30 MG injection Inject 30 mg into the muscle every 28 (twenty-eight) days.     . potassium chloride SA (KLOR-CON M20) 20 MEQ tablet TAKE 1 TABLET BY MOUTH EVERY DAY 90 tablet 2  . Vitamin D, Ergocalciferol, (DRISDOL) 1.25 MG (50000 UNIT) CAPS capsule Take 1 capsule (50,000 Units total) by mouth once a week. 12 capsule 3   No  current facility-administered medications for this visit.    PHYSICAL EXAMINATION: ECOG PERFORMANCE STATUS: 1 - Symptomatic but completely ambulatory  BP (!) 148/59 (BP Location: Right Arm, Patient Position: Sitting, Cuff Size: Normal)   Pulse (!) 54   Temp (!) 97.1 F (36.2 C) (Tympanic)   Wt 223 lb 6.4 oz (101.3 kg)   SpO2 95%   BMI 39.57 kg/m   Filed Weights   08/08/19 1000  Weight: 223 lb 6.4 oz (101.3 kg)    Physical Exam Constitutional:      Comments: Patient is a wheelchair.  She is alone.  Patient is hard of hearing.  HENT:     Head: Normocephalic and atraumatic.     Mouth/Throat:     Pharynx: No oropharyngeal exudate.  Eyes:     Pupils: Pupils are equal, round, and reactive to light.  Cardiovascular:     Rate and Rhythm: Normal rate and regular rhythm.  Pulmonary:     Effort: Pulmonary effort is normal. No respiratory distress.     Breath  sounds: Normal breath sounds. No wheezing.  Abdominal:     General: Bowel sounds are normal. There is no distension.     Palpations: Abdomen is soft. There is no mass.     Tenderness: There is no abdominal tenderness. There is no guarding or rebound.  Musculoskeletal:        General: Swelling present. No tenderness. Normal range of motion.     Cervical back: Normal range of motion and neck supple.  Skin:    General: Skin is warm.  Neurological:     Mental Status: She is alert and oriented to person, place, and time.  Psychiatric:        Mood and Affect: Affect normal.     LABORATORY DATA:  I have reviewed the data as listed    Component Value Date/Time   NA 137 08/08/2019 0925   NA 137 07/12/2013 0841   K 3.6 08/08/2019 0925   K 4.7 07/12/2013 0841   CL 102 08/08/2019 0925   CL 107 07/12/2013 0841   CO2 25 08/08/2019 0925   CO2 20 (L) 07/12/2013 0841   GLUCOSE 126 (H) 08/08/2019 0925   GLUCOSE 122 (H) 07/12/2013 0841   BUN 16 08/08/2019 0925   BUN 18 07/12/2013 0841   CREATININE 0.78 08/08/2019 0925   CREATININE 0.64 05/22/2014 1111   CALCIUM 9.0 08/08/2019 0925   CALCIUM 9.2 07/12/2013 0841   PROT 6.9 08/08/2019 0925   PROT 7.0 05/22/2014 1111   ALBUMIN 3.9 08/08/2019 0925   ALBUMIN 4.2 05/22/2014 1111   AST 27 08/08/2019 0925   AST 29 05/22/2014 1111   ALT 18 08/08/2019 0925   ALT 17 05/22/2014 1111   ALKPHOS 78 08/08/2019 0925   ALKPHOS 95 05/22/2014 1111   BILITOT 1.0 08/08/2019 0925   BILITOT 0.8 05/22/2014 1111   GFRNONAA >60 08/08/2019 0925   GFRNONAA >60 05/22/2014 1111   GFRAA >60 08/08/2019 0925   GFRAA >60 05/22/2014 1111    No results found for: SPEP, UPEP  Lab Results  Component Value Date   WBC 6.1 08/08/2019   NEUTROABS 4.1 08/08/2019   HGB 12.5 08/08/2019   HCT 36.7 08/08/2019   MCV 87.6 08/08/2019   PLT 187 08/08/2019      Chemistry      Component Value Date/Time   NA 137 08/08/2019 0925   NA 137 07/12/2013 0841   K 3.6  08/08/2019 0925   K 4.7 07/12/2013  0841   CL 102 08/08/2019 0925   CL 107 07/12/2013 0841   CO2 25 08/08/2019 0925   CO2 20 (L) 07/12/2013 0841   BUN 16 08/08/2019 0925   BUN 18 07/12/2013 0841   CREATININE 0.78 08/08/2019 0925   CREATININE 0.64 05/22/2014 1111      Component Value Date/Time   CALCIUM 9.0 08/08/2019 0925   CALCIUM 9.2 07/12/2013 0841   ALKPHOS 78 08/08/2019 0925   ALKPHOS 95 05/22/2014 1111   AST 27 08/08/2019 0925   AST 29 05/22/2014 1111   ALT 18 08/08/2019 0925   ALT 17 05/22/2014 1111   BILITOT 1.0 08/08/2019 0925   BILITOT 0.8 05/22/2014 1111       RADIOGRAPHIC STUDIES: I have personally reviewed the radiological images as listed and agreed with the findings in the report. No results found.   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  # Continue Sandostatin on a monthly basis; also discussed the role of Lutathera in disease control.   #Long discussion the patient regarding the benefit of lutathera in the treatment of well-differentiated neuroendocrine tumors. In phase III NETTER-1 trial-most recent update-improvement in overall survival compared to high-dose Sandostatin-24 months versus not reached in the experimental arm. Response rates are only up to 20%; majority with stability of the disease. Median PFS is about 3 years.  Discussed the mechanism of action; potential side effects including but not limited to renal dysfunction nausea; also risk of carcinoid crisis.  Discussed the logistics that [name] is offered as IV every 2 months/[facility] with Dr. Leonia Reeves.  Patient will also need to be on  Sandostatin on a monthly basis.Patient understands treatments are palliative.     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  Gunnar Fusi [id number]- Iron sat-9%]; s/p IV venofer- Hb 12.4; HOLD venofer to day; check iron studies next visit.   # chronic mild diarrhea- ? Sec to carcinoid [not on xarmelo secondary insurance issues--STABLE>   # DISPOSITION- # Sandostatin today; No venofer.  # Follow up in 1  month; MD cbc/cmp; iron studies/ferritin-Sandstatin; possible venofer;  Dr.B  # I reviewed the blood work- with the patient in detail; also reviewed the imaging independently [as summarized above]; and with the patient in detail.    Addendum: I tried to reach patient's daughter to discuss Lutathera treatment plan; unable to leave voicemail.  We will try again.    Orders Placed This Encounter  Procedures  . NM LUTATHERA ADMINISTRATION    Standing Status:   Future    Standing Expiration Date:   08/07/2020    Order Specific Question:   Reason for Exam (SYMPTOM  OR DIAGNOSIS REQUIRED)    Answer:   carcinoid small bowel    Order Specific Question:   If indicated for the ordered procedure, I authorize the administration of a radiopharmaceutical per Radiology protocol    Answer:   Yes    Order Specific Question:   Preferred imaging location?    Answer:   [facility]    Order Specific Question:   Radiology Contrast Protocol - do NOT remove file path    Answer:   [email or web address]  . CBC with Differential    Standing Status:  Future    Standing Expiration Date:   08/07/2020  . Comprehensive metabolic panel    Standing Status:   Future    Standing Expiration Date:   08/07/2020  . Ferritin    Standing Status:   Future    Standing Expiration Date:   08/07/2020  . Iron and TIBC    Standing Status:   Future    Standing Expiration Date:   08/07/2020   All questions were answered. The patient knows to call the clinic with any problems, questions or concerns.       [name], MD 08/08/2019 9:46 PM

## 2019-08-08 NOTE — Assessment & Plan Note (Addendum)
#   Non- functional Small bowel/mesenteric carcinoid; STAGE IV-on octreotide injections monthly; July 2021-gallium PET scan [compared to January -Overall stable/no evidence of obvious progression; however slight increase in size of the mesenteric mass/liver lesion by few millimeters; also slight increase in SUV uptake.  # Continue Sandostatin on a monthly basis; also discussed the role of Lutathera in disease control.   #Long discussion the patient regarding the benefit of lutathera in the treatment of well-differentiated neuroendocrine tumors. In phase III NETTER-1 trial-most recent update-improvement in overall survival compared to high-dose Sandostatin-24 months versus not reached in the experimental arm. Response rates are only up to 20%; majority with stability of the disease. Median PFS is about 3 years.  Discussed the mechanism of action; potential side effects including but not limited to renal dysfunction nausea; also risk of carcinoid crisis.  Discussed the logistics that Emma [name] is offered as IV every 2 months/Emma [name] with Dr. Leonia [name].  Patient will also need to be on Sandostatin on a monthly basis.Patient understands treatments are palliative.     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  Emma [name] [id number]- Iron sat-9%]; s/p IV venofer- Hb 12.4; HOLD venofer to day; check iron studies next visit.   # chronic mild diarrhea- ? Sec to carcinoid [not on xarmelo secondary insurance issues--STABLE>   # DISPOSITION- # Sandostatin today; No venofer.  # Follow up in 1  month; MD cbc/cmp; iron studies/ferritin-Sandstatin; possible venofer;  Emma [name]  # I reviewed the blood work- with the patient in detail; also reviewed the imaging independently [as summarized above]; and with the patient in detail.    Addendum: I tried to reach patient's daughter to discuss Lutathera treatment plan; unable to leave voicemail.  We will try again.

## 2019-08-08 NOTE — Telephone Encounter (Signed)
Left vm for daughter that Dr. B will contact her this afternoon.

## 2019-08-08 NOTE — Telephone Encounter (Signed)
H- please reach out to daughter in AM.  --------------------------------------------------- Hi- I tried to reach you re: treatment with Lutathera.I was unable to leave voice mail. Please call us back to discuss further. Thanks Dr.B ----------------------------

## 2019-08-08 NOTE — Telephone Encounter (Signed)
Daughter [name] called and requested a return phone call from dr. B to discuss the patient's plan of care and referral to IR

## 2019-08-09 ENCOUNTER — Telehealth: Payer: Self-pay | Admitting: Internal Medicine

## 2019-08-09 ENCOUNTER — Telehealth: Payer: Self-pay | Admitting: *Deleted

## 2019-08-09 NOTE — Telephone Encounter (Signed)
Patient's daughter was returning a call from Dr. Burlene Arnt.

## 2019-08-09 NOTE — Telephone Encounter (Signed)
Cecille Rubin- please inform daughter that I will again try to reach her later in the afternoon. Thx GB

## 2019-08-09 NOTE — Telephone Encounter (Signed)
On 7/13- spoke to pt's daughter, [name] re: my recommendations for evaluation/ rationale for treatment with for Lutathera.   GB

## 2019-08-09 NOTE — Telephone Encounter (Signed)
Dr. Sharmaine Base daughter's phone # [phone]

## 2019-08-29 ENCOUNTER — Other Ambulatory Visit (HOSPITAL_COMMUNITY): Payer: Self-pay | Admitting: Internal Medicine

## 2019-08-29 DIAGNOSIS — C7B02 Secondary carcinoid tumors of liver: Secondary | ICD-10-CM

## 2019-08-30 ENCOUNTER — Other Ambulatory Visit: Payer: Self-pay

## 2019-08-30 ENCOUNTER — Ambulatory Visit (HOSPITAL_COMMUNITY)
Admission: RE | Admit: 2019-08-30 | Discharge: 2019-08-30 | Disposition: A | Discharge status: Home / Self Care | Payer: Medicare Other | Source: Ambulatory Visit | Attending: Internal Medicine | Admitting: Internal Medicine

## 2019-08-30 DIAGNOSIS — C7B8 Other secondary neuroendocrine tumors: Secondary | ICD-10-CM | POA: Insufficient documentation

## 2019-08-30 NOTE — Consult Note (Signed)
Chief Complaint: Patient with metastatic neuroendocrine tumor was for  evaluation Peptide receptor radiotherapy (PRRT) with QM578 DOTATATE (Lutathera).  Referring Physician(s):Brahmanday    Patient Status: [facility] - Out-pt  History of Present Illness: Emma [name] is a 79 y.o. female Well differentiated metastatic neuroendocrine tumor.  Initial mesenteric mass identified in June 2015.  Positive octreotide scan confirmed neuroendocrine tumor.  Subsequent gallium 68 dotatate PET scans have further identified neuroendocrine tumor as well differentiated.  Patient initiated long-acting Sandostatin injections and over 2017.  Currently 30 mg Q monthly.   Mild progression of neuroendocrine tumor demonstrated on most recent DOTATATE PET scan of 08/30/2019.]     Past Medical History:  Diagnosis Date  . Aromatase inhibitor use   . Breast cancer ([place]) 2014   left breast cancer/radiation  . Breast cancer, left breast ([place]) 06/19/2014  . CHF (congestive heart failure) ([facility])    recent sepsis  . Chronic kidney disease   . Dizziness   . Dyspnea    recently while admitted  . GERD (gastroesophageal reflux disease)   . Hearing loss   . History of hiatal hernia   . History of kidney stones   . HOH (hard of hearing)    severe  . Hypertension   . Hypothyroidism   . Leg DVT (deep venous thromboembolism), acute ([place]) 07/2015   left leg after fracture  . 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 Pain    chest pain while admitted  . Personal history of radiation therapy 2015   left breast ca  . Thyroid disease     Past Surgical History:  Procedure Laterality Date  . ABDOMINAL HYSTERECTOMY     partial  . BREAST LUMPECTOMY Left 01/13/2013   invasive mammary carcinoma, clear margins, LN negative  . BREAST LUMPECTOMY WITH SENTINEL LYMPH NODE BIOPSY Left 2014  . CHOLECYSTECTOMY    . COLONOSCOPY WITH PROPOFOL N/A 09/17/2017   Procedure: COLONOSCOPY WITH PROPOFOL;  Surgeon: Jonathon Bellows, MD;   Location: [facility] ENDOSCOPY;  Service: Gastroenterology;  Laterality: N/A;  . CYSTOSCOPY W/ URETERAL STENT PLACEMENT Right 01/07/2016   Procedure: CYSTOSCOPY WITH STENT REPLACEMENT;  Surgeon: Hollice Espy, MD;  Location: [facility] ORS;  Service: Urology;  Laterality: Right;  . CYSTOSCOPY WITH RETROGRADE PYELOGRAM, URETEROSCOPY AND STENT PLACEMENT Right 12/19/2015   Procedure: CYSTOSCOPY WITH RETROGRADE PYELOGRAM, URETEROSCOPY AND STENT PLACEMENT;  Surgeon: Ardis Hughs, MD;  Location: [facility] ORS;  Service: Urology;  Laterality: Right;  . URETEROSCOPY WITH HOLMIUM LASER LITHOTRIPSY Right 01/07/2016   Procedure: URETEROSCOPY WITH HOLMIUM LASER LITHOTRIPSY;  Surgeon: Hollice Espy, MD;  Location: [facility] ORS;  Service: Urology;  Laterality: Right;    Allergies: Sulfa antibiotics  Medications: Prior to Admission medications   Medication Sig Start Date End Date Taking? Authorizing Provider  Calcium Carb-Cholecalciferol (CALCIUM 600 + D PO) Take 1 tablet by mouth daily.    [provider]  furosemide (LASIX) 40 MG tablet Take 40 mg by mouth daily as needed for fluid or edema.     [provider]  hyoscyamine (ANASPAZ) 0.125 MG TBDP disintergrating tablet Place 0.25 mg under the tongue every 4 (four) hours as needed for cramping.    [provider]  labetalol (NORMODYNE) 200 MG tablet Take 200 mg by mouth 2 (two) times daily.     [provider]  levothyroxine (SYNTHROID) 88 MCG tablet Take 88 mcg by mouth daily before breakfast.  06/20/14   [provider]  losartan-hydrochlorothiazide (HYZAAR) 100-25 MG tablet Take 1 tablet by  mouth daily.    [provider]  nystatin (MYCOSTATIN/NYSTOP) powder APPLY TO AFFECTED AREA TWICE A DAY 01/18/18   [name], MD  octreotide (SANDOSTATIN LAR) 30 MG injection Inject 30 mg into the muscle every 28 (twenty-eight) days.     [provider]  potassium chloride SA (KLOR-CON M20) 20 MEQ tablet  TAKE 1 TABLET BY MOUTH EVERY DAY 06/09/19   [name], MD  Vitamin D, Ergocalciferol, (DRISDOL) 1.25 MG (50000 UNIT) CAPS capsule Take 1 capsule (50,000 Units total) by mouth once a week. 03/11/19   [name], MD     Family History  Problem Relation Age of Onset  . Breast cancer Mother 48       deceased 20  . Breast cancer Maternal Aunt 68       deceased 45s  . Prostate cancer Father        deceased 30  . Colon cancer Paternal Aunt 64       deceased 3s    Social History   Socioeconomic History  . Marital status: Widowed    Spouse name: Not on file  . Number of children: Not on file  . Years of education: Not on file  . Highest education level: Not on file  Occupational History  . Not on file  Tobacco Use  . Smoking status: Never Smoker  . Smokeless tobacco: Never Used  Substance and Sexual Activity  . Alcohol use: No    Alcohol/week: 0.0 standard drinks  . Drug use: No  . Sexual activity: Not on file  Other Topics Concern  . Not on file  Social History Narrative  . Not on file   Social Determinants of Health   Financial Resource Strain:   . Difficulty of Paying Living Expenses:   Food Insecurity:   . Worried About Charity fundraiser in the Last Year:   . Arboriculturist in the Last Year:   Transportation Needs:   . Film/video editor (Medical):   Marland Kitchen Lack of Transportation (Non-Medical):   Physical Activity:   . Days of Exercise per Week:   . Minutes of Exercise per Session:   Stress:   . Feeling of Stress :   Social Connections:   . Frequency of Communication with Friends and Family:   . Frequency of Social Gatherings with Friends and Family:   . Attends Religious Services:   . Active Member of Clubs or Organizations:   . Attends Archivist Meetings:   Marland Kitchen Marital Status:     ECOG Status: 1 - Symptomatic but completely ambulatory  Review of Systems: A 12 point ROS discussed and pertinent positives are indicated in  the HPI above.  All other systems are negative.  Review of Systems  Vital Signs: There were no vitals taken for this visit.  Physical Exam  Imaging: NM PET (NETSPOT GA 32 DOTATATE) SKULL BASE TO MID THIGH  Result Date: 08/04/2019 CLINICAL DATA:  Carcinoid tumor with liver metastasis. Sandostatin injection 07/11/2019. EXAM: NUCLEAR MEDICINE PET SKULL BASE TO THIGH TECHNIQUE: 5.7 mCi Ga 40 DOTATATE was injected intravenously. Full-ring PET imaging was performed from the skull base to thigh after the radiotracer. CT data was obtained and used for attenuation correction and anatomic localization. COMPARISON:  CT abdomen 06/14/2018, DOTATATE PET scan 02/18/2018 FINDINGS: NECK No radiotracer activity in neck lymph nodes. Incidental CT findings: None CHEST No radiotracer accumulation within mediastinal or hilar lymph nodes. No suspicious pulmonary nodules on  the CT scan. Incidental CT finding:Coronary artery calcification and aortic atherosclerotic calcification. ABDOMEN/PELVIS Again demonstrated central mesenteric mass with intense radiotracer activity measuring 4.3 x 3.1 cm with SUV max equal 45.5 compared to 5.0 x 2.4 cm SUV max equal 31 Mass lesion has central calcification and tethering of adjacent small bowel typical of neuroendocrine tumor. There is a hypermetabolic focus in the adjacent small bowel which is also similar to prior with SUV max equal 17.9 on image 110 compared with SUV max equal 14.5. Lesion is not readily identified on the CT portion of the noncontrast exam. Within LEFT hepatic lobe a solitary metastasis with SUV max equal 23 compares with SUV max equal 21. Lesion faintly identified on noncontrast CT measuring 1.6 cm on image 143 compared to 1.5 cm on prior. No new hepatic lesions. Peritoneal metastasis in the ventral peritoneal space below the umbilicus is also unchanged with SUV max equal 23 on image 75. Peritoneal lesion is more prominent in the posterior cul-de-sac SUV max equal 13  compared SUV max equal 6.5 (image 68.) this small nodule may be increase in size measuring 7 mm image 223/3 compared to 6 mm. Physiologic activity noted in the liver, spleen, adrenal glands and kidneys. Incidental CT findings:No bowel obstruction. Uterus normal. Ovoid lesion in the RIGHT adnexa measures 5.7 by 3.4 cm similar to 5.4 x 3.1 cm. Stable over multiple comparison exams back to 2018. SKELETON No focal activity to suggest skeletal metastasis. Incidental CT findings:None IMPRESSION: 1. Well differentiated neuroendocrine tumor with central mesenteric mass, small-bowel lesion, liver metastasis, and peritoneal metastasis. 2. While the size of lesions are not significant increased, there is consistent increase in radiotracer activity of the lesions. Most notable in the mesenteric nodal mass and the small peritoneal implant in the posterior cul-de-sac. 3. Again noted lesion within the small bowel adjacent to the mesenteric mass is not well-defined on CT. No evidence of bowel obstruction. This may represent primary lesion. Electronically Signed   By: Suzy Bouchard M.D.   On: 08/04/2019 13:36    Labs:  CBC: Recent Labs    04/18/19 1246 05/16/19 1314 07/11/19 1008 08/08/19 0925  WBC 6.3 6.5 5.8 6.1  HGB 12.4 12.6 12.3 12.5  HCT 37.7 38.0 36.9 36.7  PLT 203 203 187 187    COAGS: No results for input(s): INR, APTT in the last 8760 hours.  BMP: Recent Labs    04/18/19 1246 05/16/19 1314 07/11/19 1008 08/08/19 0925  NA 138 139 139 137  K 3.7 3.4* 3.4* 3.6  CL 104 103 100 102  CO2 23 25 27 25   GLUCOSE 115* 119* 131* 126*  BUN 18 17 15 16   CALCIUM 8.9 9.1 9.1 9.0  CREATININE 0.74 0.67 0.82 0.78  GFRNONAA >60 >60 >60 >60  GFRAA >60 >60 >60 >60    LIVER FUNCTION TESTS: Recent Labs    04/18/19 1246 05/16/19 1314 07/11/19 1008 08/08/19 0925  BILITOT 0.8 0.7 1.1 1.0  AST 26 25 27 27   ALT 19 17 19 18   ALKPHOS 80 81 75 78  PROT 6.9 6.9 6.8 6.9  ALBUMIN 4.0 3.9 3.8 3.9     TUMOR MARKERS: Recent Labs    09/03/18 1009 11/10/18 1304 05/16/19 1314 07/11/19 1008  CHROMOGA 132.7* 102.5* 81.9 76.5    Assessment and Plan: [Patient is a candidate for peptide receptor radiotherapy with mildly progressive well differentiated neuroendocrine tumor with liver metastasis, central mesenteric metastasis and small peritoneal metastasis identfied on most recent DOTATATE PET scan 08/11/2018.  The lesions identified on Ga68 DOTATATE PET scan are intensely avid for radiotracer and therefore would anticipate good efficacy with Lu 177 DOTATATE peptide receptor radiotherapy  The patient was counseled on the primary goal of therapy which is prolongation of progression free survival (79% improvement over standard therapy). Overall all survival data is acrueing with approximately 48% increase. Secondary goals would include decrease in tumor burden.   Primary of toxicities of therapy were explained to patient including marrow suppression, renal toxicity and hepatic toxicity.  Rare toxicity of myelosuppression and leukemia also explained.  Potential toxicity will be monitored throughout the course of therapy with interval CBC and CMP laboratory evaluation.   Four therapies will be scheduled 2 months apart over a  six-month interval.  Patient will receive IM Sandostatin injection in the molecular imaging department after each therapy.  Patient will return to oncology clinic 1 month following each therapy for CBC and CMP and IM Sandostatin injection.    Thank you for this interesting consult.  I greatly enjoyed meeting SHAOLIN ARMAS and look forward to participating in their care.  A copy of this report was sent to the requesting provider on this date.  Electronically Signed: Rennis Golden, MD 08/30/2019, 2:30 PM   I spent a total of  30 Minutes   in face to face in clinical consultation, greater than 50% of which was counseling/coordinating care for metastatic neuroendocrine  tumor.

## 2019-09-05 ENCOUNTER — Inpatient Hospital Stay: Payer: Medicare Other | Attending: Internal Medicine

## 2019-09-05 ENCOUNTER — Inpatient Hospital Stay: Payer: Medicare Other

## 2019-09-05 ENCOUNTER — Encounter: Payer: Self-pay | Admitting: Internal Medicine

## 2019-09-05 ENCOUNTER — Inpatient Hospital Stay (HOSPITAL_BASED_OUTPATIENT_CLINIC_OR_DEPARTMENT_OTHER): Payer: Medicare Other | Admitting: Internal Medicine

## 2019-09-05 ENCOUNTER — Other Ambulatory Visit: Payer: Self-pay

## 2019-09-05 DIAGNOSIS — C7B02 Secondary carcinoid tumors of liver: Secondary | ICD-10-CM | POA: Insufficient documentation

## 2019-09-05 DIAGNOSIS — N189 Chronic kidney disease, unspecified: Secondary | ICD-10-CM | POA: Insufficient documentation

## 2019-09-05 DIAGNOSIS — C7A019 Malignant carcinoid tumor of the small intestine, unspecified portion: Secondary | ICD-10-CM | POA: Insufficient documentation

## 2019-09-05 DIAGNOSIS — I130 Hypertensive heart and chronic kidney disease with heart failure and stage 1 through stage 4 chronic kidney disease, or unspecified chronic kidney disease: Secondary | ICD-10-CM | POA: Diagnosis not present

## 2019-09-05 DIAGNOSIS — E340 Carcinoid syndrome: Secondary | ICD-10-CM | POA: Insufficient documentation

## 2019-09-05 DIAGNOSIS — D509 Iron deficiency anemia, unspecified: Secondary | ICD-10-CM | POA: Insufficient documentation

## 2019-09-05 DIAGNOSIS — I509 Heart failure, unspecified: Secondary | ICD-10-CM | POA: Insufficient documentation

## 2019-09-05 DIAGNOSIS — E039 Hypothyroidism, unspecified: Secondary | ICD-10-CM | POA: Diagnosis not present

## 2019-09-05 DIAGNOSIS — K6389 Other specified diseases of intestine: Secondary | ICD-10-CM

## 2019-09-05 LAB — CBC WITH DIFFERENTIAL/PLATELET
Abs Immature Granulocytes: 0.02 10*3/uL (ref 0.00–0.07)
Basophils Absolute: 0 10*3/uL (ref 0.0–0.1)
Basophils Relative: 1 %
Eosinophils Absolute: 0.4 10*3/uL (ref 0.0–0.5)
Eosinophils Relative: 7 %
HCT: 37.1 % (ref 36.0–46.0)
Hemoglobin: 12.4 g/dL (ref 12.0–15.0)
Immature Granulocytes: 0 %
Lymphocytes Relative: 11 %
Lymphs Abs: 0.7 10*3/uL (ref 0.7–4.0)
MCH: 29.6 pg (ref 26.0–34.0)
MCHC: 33.4 g/dL (ref 30.0–36.0)
MCV: 88.5 fL (ref 80.0–100.0)
Monocytes Absolute: 0.9 10*3/uL (ref 0.1–1.0)
Monocytes Relative: 14 %
Neutro Abs: 4.1 10*3/uL (ref 1.7–7.7)
Neutrophils Relative %: 67 %
Platelets: 182 10*3/uL (ref 150–400)
RBC: 4.19 MIL/uL (ref 3.87–5.11)
RDW: 14.1 % (ref 11.5–15.5)
WBC: 6.1 10*3/uL (ref 4.0–10.5)
nRBC: 0 % (ref 0.0–0.2)

## 2019-09-05 LAB — COMPREHENSIVE METABOLIC PANEL
ALT: 20 U/L (ref 0–44)
AST: 29 U/L (ref 15–41)
Albumin: 3.9 g/dL (ref 3.5–5.0)
Alkaline Phosphatase: 78 U/L (ref 38–126)
Anion gap: 13 (ref 5–15)
BUN: 18 mg/dL (ref 8–23)
CO2: 22 mmol/L (ref 22–32)
Calcium: 9 mg/dL (ref 8.9–10.3)
Chloride: 103 mmol/L (ref 98–111)
Creatinine, Ser: 0.81 mg/dL (ref 0.44–1.00)
GFR calc Af Amer: >60 mL/min (ref >60)
GFR calc non Af Amer: >60 mL/min (ref >60)
Glucose, Bld: 122 mg/dL — ABNORMAL HIGH (ref 70–99)
Potassium: 3.4 mmol/L — ABNORMAL LOW (ref 3.5–5.1)
Sodium: 138 mmol/L (ref 135–145)
Total Bilirubin: 1 mg/dL (ref 0.3–1.2)
Total Protein: 6.7 g/dL (ref 6.5–8.1)

## 2019-09-05 LAB — FERRITIN: Ferritin: 17 ng/mL (ref 11–307)

## 2019-09-05 LAB — IRON AND TIBC
Iron: 87 ug/dL (ref 28–170)
Saturation Ratios: 21 % (ref 10.4–31.8)
TIBC: 410 ug/dL (ref 250–450)
UIBC: 323 ug/dL

## 2019-09-05 MED ORDER — OCTREOTIDE ACETATE 30 MG IM KIT
30.0000 mg | PACK | Freq: Once | INTRAMUSCULAR | Status: AC
  Administered 2019-09-05: 30 mg via INTRAMUSCULAR
  Filled 2019-09-05: qty 1

## 2019-09-05 NOTE — Assessment & Plan Note (Addendum)
#   Non- functional Small bowel/mesenteric carcinoid; STAGE IV-on octreotide injections monthly; July 2021-gallium PET scan [compared to January -Overall stable/no evidence of obvious progression; however slight increase in size of the mesenteric mass/liver lesion by few millimeters; also slight increase in SUV uptake.  # Continue Sandostatin on a monthly basis; again reviewed the role of Lutathera in disease control.  Patient is interested however wants some more time to think about the treatment.  Again reviewed the potential side effects of the Lutathera.  Patient will inform of her decision at next visit.  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  Gunnar Fusi [id number]- Iron sat-9%]; s/p IV venofer- Hb 12.4; HOLD venofer to day iron studies pending today.    # chronic mild diarrhea- ? 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  # Follow up in 1  month; MD cbc/cmp;-Sandstatin; possible venofer;  Dr.B

## 2019-09-05 NOTE — Progress Notes (Signed)
[facility] OFFICE PROGRESS NOTE  [name], [name] [name], [name] [name], [name] [name], [name]  Cancer Staging No matching staging information was found for the patient.   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   # FEB 2018- Ga PET- uptake in liver/ mesenteric mass; NOV 2019- increase sandostatin to 69m q monthly.   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  [name].  female pleasant patient above  history of metastatic carcinoid tumor currently on Sandostatin/iron deficient anemia question AV malformation is here for follow-up.  [facility]  Patient denies any abdominal pain.  Any blood in stools.  Chronic mild to moderate diarrhea.  Not any worse.  No weight loss no flushing.  Chronic swelling in the legs not any worse  Review of Systems  Constitutional: Positive for malaise/fatigue. Negative for chills, diaphoresis, fever and weight loss.  HENT: Negative for nosebleeds and sore throat.   Eyes: Negative for double vision.  Respiratory: Negative for hemoptysis, sputum production, shortness of breath and wheezing.   Cardiovascular: Positive for leg swelling. Negative for chest pain, palpitations and orthopnea.  Gastrointestinal: Positive for diarrhea. Negative for constipation, heartburn, melena, nausea and vomiting.  Genitourinary: Negative for dysuria, frequency and urgency.  Musculoskeletal: Negative for back pain and joint pain.  Skin: Negative.  Negative for itching and rash.  Neurological: Negative for dizziness, tingling, focal weakness, weakness and headaches.  Endo/Heme/Allergies: Does not bruise/bleed easily.  Psychiatric/Behavioral: Negative for depression. The patient is not nervous/anxious and does not have insomnia.       PAST MEDICAL HISTORY :  Past Medical History:  Diagnosis Date  . Aromatase inhibitor use   . Breast cancer ([place] 2014   left breast cancer/radiation  . Breast cancer, left breast ([place] 06/19/2014  . CHF (congestive heart failure) (HCC)    recent sepsis  . Chronic kidney disease   . Dizziness   . Dyspnea    recently while admitted  . GERD (gastroesophageal reflux disease)   . Hearing loss   .  History of hiatal hernia   . History of kidney stones   . HOH (hard of hearing)    severe  . Hypertension   . Hypothyroidism   . Leg DVT (deep venous thromboembolism), acute  ([place]) 07/2015   left leg after fracture  . 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 Pain    chest pain while admitted  . Personal history of radiation therapy 2015   left breast ca  . Thyroid disease     PAST SURGICAL HISTORY :   Past Surgical History:  Procedure Laterality Date  . ABDOMINAL HYSTERECTOMY     partial  . BREAST LUMPECTOMY Left 01/13/2013   invasive mammary carcinoma, clear margins, LN negative  . BREAST LUMPECTOMY WITH SENTINEL LYMPH NODE BIOPSY Left 2014  . CHOLECYSTECTOMY    . COLONOSCOPY WITH PROPOFOL N/A 09/17/2017   Procedure: COLONOSCOPY WITH PROPOFOL;  Surgeon: Jonathon Bellows, MD;  Location: [facility];  Service: Gastroenterology;  Laterality: N/A;  . CYSTOSCOPY W/ URETERAL STENT PLACEMENT Right 01/07/2016   Procedure: CYSTOSCOPY WITH STENT REPLACEMENT;  Surgeon: Hollice Espy, MD;  Location: [facility] ORS;  Service: Urology;  Laterality: Right;  . CYSTOSCOPY WITH RETROGRADE PYELOGRAM, URETEROSCOPY AND STENT PLACEMENT Right 12/19/2015   Procedure: CYSTOSCOPY WITH RETROGRADE PYELOGRAM, URETEROSCOPY AND STENT PLACEMENT;  Surgeon: Ardis Hughs, MD;  Location: [facility] ORS;  Service: Urology;  Laterality: Right;  . URETEROSCOPY WITH HOLMIUM LASER LITHOTRIPSY Right 01/07/2016   Procedure: URETEROSCOPY WITH HOLMIUM LASER LITHOTRIPSY;  Surgeon: Hollice Espy, MD;  Location: [facility] ORS;  Service: Urology;  Laterality: Right;    FAMILY HISTORY :   Family History  Problem Relation Age of Onset  . Breast cancer Mother 42       deceased 54  . Breast cancer Maternal Aunt 68       deceased 83s  . Prostate cancer Father        deceased 50  . Colon cancer Paternal Aunt 80       deceased 61s    SOCIAL HISTORY:   Social History   Tobacco Use  . Smoking status: Never Smoker  . Smokeless tobacco: Never Used  Substance Use Topics  . Alcohol use: No    Alcohol/week: 0.0 standard drinks  . Drug use: No    ALLERGIES:  is allergic to sulfa antibiotics.  MEDICATIONS:  Current  Outpatient Medications  Medication Sig Dispense Refill  . Calcium Carb-Cholecalciferol (CALCIUM 600 + D PO) Take 1 tablet by mouth daily.    . furosemide (LASIX) 40 MG tablet Take 40 mg by mouth daily as needed for fluid or edema.     . hyoscyamine (ANASPAZ) 0.125 MG TBDP disintergrating tablet Place 0.25 mg under the tongue every 4 (four) hours as needed for cramping.    . labetalol (NORMODYNE) 200 MG tablet Take 200 mg by mouth 2 (two) times daily.     Marland Kitchen levothyroxine (SYNTHROID) 88 MCG tablet Take 88 mcg by mouth daily before breakfast.     . losartan-hydrochlorothiazide (HYZAAR) 100-25 MG tablet Take 1 tablet by mouth daily.    Marland Kitchen nystatin (MYCOSTATIN/NYSTOP) powder APPLY TO AFFECTED AREA TWICE A DAY 15 g 3  . octreotide (SANDOSTATIN LAR) 30 MG injection Inject 30 mg into the muscle every 28 (twenty-eight) days.     . potassium chloride SA (KLOR-CON M20) 20 MEQ tablet TAKE 1 TABLET BY MOUTH EVERY DAY 90 tablet 2  . Vitamin D, Ergocalciferol, (DRISDOL) 1.25 MG (50000 UNIT) CAPS capsule Take 1 capsule (50,000 Units total) by mouth  once a week. 12 capsule 3   No current facility-administered medications for this visit.    PHYSICAL EXAMINATION: ECOG PERFORMANCE STATUS: 1 - Symptomatic but completely ambulatory  BP (!) 131/49 (BP Location: Left Arm, Patient Position: Sitting, Cuff Size: Large)   Pulse (!) 53   Temp 97.8 F (36.6 C) (Tympanic)   Resp 16   Ht 5' 3"  (1.6 m)   Wt 223 lb (101.2 kg)   SpO2 96%   BMI 39.50 kg/m   Filed Weights   09/05/19 1103  Weight: 223 lb (101.2 kg)    Physical Exam Constitutional:      Comments: Patient is a wheelchair.  She is alone.  Patient is hard of hearing.  HENT:     Head: Normocephalic and atraumatic.     Mouth/Throat:     Pharynx: No oropharyngeal exudate.  Eyes:     Pupils: Pupils are equal, round, and reactive to light.  Cardiovascular:     Rate and Rhythm: Normal rate and regular rhythm.  Pulmonary:     Effort: Pulmonary effort  is normal. No respiratory distress.     Breath sounds: Normal breath sounds. No wheezing.  Abdominal:     General: Bowel sounds are normal. There is no distension.     Palpations: Abdomen is soft. There is no mass.     Tenderness: There is no abdominal tenderness. There is no guarding or rebound.  Musculoskeletal:        General: Swelling present. No tenderness. Normal range of motion.     Cervical back: Normal range of motion and neck supple.  Skin:    General: Skin is warm.  Neurological:     Mental Status: She is alert and oriented to person, place, and time.  Psychiatric:        Mood and Affect: Affect normal.     LABORATORY DATA:  I have reviewed the data as listed    Component Value Date/Time   NA 138 09/05/2019 1022   NA 137 07/12/2013 0841   K 3.4 (L) 09/05/2019 1022   K 4.7 07/12/2013 0841   CL 103 09/05/2019 1022   CL 107 07/12/2013 0841   CO2 22 09/05/2019 1022   CO2 20 (L) 07/12/2013 0841   GLUCOSE 122 (H) 09/05/2019 1022   GLUCOSE 122 (H) 07/12/2013 0841   BUN 18 09/05/2019 1022   BUN 18 07/12/2013 0841   CREATININE 0.81 09/05/2019 1022   CREATININE 0.64 05/22/2014 1111   CALCIUM 9.0 09/05/2019 1022   CALCIUM 9.2 07/12/2013 0841   PROT 6.7 09/05/2019 1022   PROT 7.0 05/22/2014 1111   ALBUMIN 3.9 09/05/2019 1022   ALBUMIN 4.2 05/22/2014 1111   AST 29 09/05/2019 1022   AST 29 05/22/2014 1111   ALT 20 09/05/2019 1022   ALT 17 05/22/2014 1111   ALKPHOS 78 09/05/2019 1022   ALKPHOS 95 05/22/2014 1111   BILITOT 1.0 09/05/2019 1022   BILITOT 0.8 05/22/2014 1111   GFRNONAA >60 09/05/2019 1022   GFRNONAA >60 05/22/2014 1111   GFRAA >60 09/05/2019 1022   GFRAA >60 05/22/2014 1111    No results found for: SPEP, UPEP  Lab Results  Component Value Date   WBC 6.1 09/05/2019   NEUTROABS 4.1 09/05/2019   HGB 12.4 09/05/2019   HCT 37.1 09/05/2019   MCV 88.5 09/05/2019   PLT 182 09/05/2019      Chemistry      Component Value Date/Time   NA 138  09/05/2019 1022  NA 137 07/12/2013 0841   K 3.4 (L) 09/05/2019 1022   K 4.7 07/12/2013 0841   CL 103 09/05/2019 1022   CL 107 07/12/2013 0841   CO2 22 09/05/2019 1022   CO2 20 (L) 07/12/2013 0841   BUN 18 09/05/2019 1022   BUN 18 07/12/2013 0841   CREATININE 0.81 09/05/2019 1022   CREATININE 0.64 05/22/2014 1111      Component Value Date/Time   CALCIUM 9.0 09/05/2019 1022   CALCIUM 9.2 07/12/2013 0841   ALKPHOS 78 09/05/2019 1022   ALKPHOS 95 05/22/2014 1111   AST 29 09/05/2019 1022   AST 29 05/22/2014 1111   ALT 20 09/05/2019 1022   ALT 17 05/22/2014 1111   BILITOT 1.0 09/05/2019 1022   BILITOT 0.8 05/22/2014 1111       RADIOGRAPHIC STUDIES: I have personally reviewed the radiological images as listed and agreed with the findings in the report. No results found.   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  # Continue Sandostatin on a monthly basis; again reviewed the role of Lutathera in disease control.  Patient is interested however wants some more time to think about the treatment.  Again reviewed the potential side effects of the Lutathera.  Patient will inform of her decision at next visit.  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  Gunnar Fusi [id number]- Iron sat-9%]; s/p IV venofer- Hb 12.4; HOLD venofer to day iron studies pending today.    # chronic mild diarrhea- ? 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  # Follow up in 1  month; MD cbc/cmp;-Sandstatin; possible venofer;  Dr.B     Orders Placed This Encounter  Procedures  . CBC with  Differential    Standing Status:   Future    Standing Expiration Date:   09/04/2020  . Comprehensive metabolic panel    Standing Status:   Future    Standing Expiration Date:   09/04/2020   All questions were answered. The patient knows to call the clinic with any problems, questions or concerns.      [name], MD 09/05/2019 1:04 PM

## 2019-10-04 ENCOUNTER — Other Ambulatory Visit: Payer: Medicare Other

## 2019-10-04 ENCOUNTER — Ambulatory Visit: Payer: Medicare Other | Admitting: Internal Medicine

## 2019-10-04 ENCOUNTER — Ambulatory Visit: Payer: Medicare Other

## 2019-10-06 ENCOUNTER — Encounter: Payer: Self-pay | Admitting: Internal Medicine

## 2019-10-06 NOTE — Progress Notes (Signed)
Patient daughter states that patient has been complaining of new abdominal pain in new areas. She also says that patient has had a couple episodes of rectal bleeding. Patient daughter also so has stated that patient has had a couple of episodes of dizziness. She also stated that patient is refusing treatment in [place]. She also stated that patient she has been having good days and bad days. More bad days than good days.  Patient daughter stated if the doctor has any question or concerns please give her a call [phone]

## 2019-10-07 ENCOUNTER — Other Ambulatory Visit: Payer: Self-pay

## 2019-10-07 ENCOUNTER — Inpatient Hospital Stay: Payer: Medicare Other

## 2019-10-07 ENCOUNTER — Encounter: Payer: Self-pay | Admitting: Internal Medicine

## 2019-10-07 ENCOUNTER — Inpatient Hospital Stay (HOSPITAL_BASED_OUTPATIENT_CLINIC_OR_DEPARTMENT_OTHER): Payer: Medicare Other | Admitting: Internal Medicine

## 2019-10-07 ENCOUNTER — Inpatient Hospital Stay: Payer: Medicare Other | Attending: Internal Medicine

## 2019-10-07 VITALS — BP 156/76 | HR 54 | Resp 18

## 2019-10-07 DIAGNOSIS — C7B02 Secondary carcinoid tumors of liver: Secondary | ICD-10-CM | POA: Insufficient documentation

## 2019-10-07 DIAGNOSIS — R197 Diarrhea, unspecified: Secondary | ICD-10-CM | POA: Diagnosis not present

## 2019-10-07 DIAGNOSIS — E340 Carcinoid syndrome: Secondary | ICD-10-CM | POA: Insufficient documentation

## 2019-10-07 DIAGNOSIS — D509 Iron deficiency anemia, unspecified: Secondary | ICD-10-CM | POA: Insufficient documentation

## 2019-10-07 DIAGNOSIS — C7A019 Malignant carcinoid tumor of the small intestine, unspecified portion: Secondary | ICD-10-CM | POA: Insufficient documentation

## 2019-10-07 DIAGNOSIS — K6389 Other specified diseases of intestine: Secondary | ICD-10-CM

## 2019-10-07 LAB — CBC WITH DIFFERENTIAL/PLATELET
Abs Immature Granulocytes: 0.02 10*3/uL (ref 0.00–0.07)
Basophils Absolute: 0.1 10*3/uL (ref 0.0–0.1)
Basophils Relative: 1 %
Eosinophils Absolute: 0.3 10*3/uL (ref 0.0–0.5)
Eosinophils Relative: 4 %
HCT: 36.7 % (ref 36.0–46.0)
Hemoglobin: 12.7 g/dL (ref 12.0–15.0)
Immature Granulocytes: 0 %
Lymphocytes Relative: 10 %
Lymphs Abs: 0.7 10*3/uL (ref 0.7–4.0)
MCH: 30.7 pg (ref 26.0–34.0)
MCHC: 34.6 g/dL (ref 30.0–36.0)
MCV: 88.6 fL (ref 80.0–100.0)
Monocytes Absolute: 0.7 10*3/uL (ref 0.1–1.0)
Monocytes Relative: 11 %
Neutro Abs: 4.8 10*3/uL (ref 1.7–7.7)
Neutrophils Relative %: 74 %
Platelets: 225 10*3/uL (ref 150–400)
RBC: 4.14 MIL/uL (ref 3.87–5.11)
RDW: 13.9 % (ref 11.5–15.5)
WBC: 6.6 10*3/uL (ref 4.0–10.5)
nRBC: 0 % (ref 0.0–0.2)

## 2019-10-07 LAB — COMPREHENSIVE METABOLIC PANEL
ALT: 16 U/L (ref 0–44)
AST: 26 U/L (ref 15–41)
Albumin: 3.8 g/dL (ref 3.5–5.0)
Alkaline Phosphatase: 80 U/L (ref 38–126)
Anion gap: 13 (ref 5–15)
BUN: 12 mg/dL (ref 8–23)
CO2: 21 mmol/L — ABNORMAL LOW (ref 22–32)
Calcium: 8.8 mg/dL — ABNORMAL LOW (ref 8.9–10.3)
Chloride: 102 mmol/L (ref 98–111)
Creatinine, Ser: 0.82 mg/dL (ref 0.44–1.00)
GFR calc Af Amer: >60 mL/min (ref >60)
GFR calc non Af Amer: >60 mL/min (ref >60)
Glucose, Bld: 133 mg/dL — ABNORMAL HIGH (ref 70–99)
Potassium: 3.5 mmol/L (ref 3.5–5.1)
Sodium: 136 mmol/L (ref 135–145)
Total Bilirubin: 0.9 mg/dL (ref 0.3–1.2)
Total Protein: 7 g/dL (ref 6.5–8.1)

## 2019-10-07 MED ORDER — IRON SUCROSE 20 MG/ML IV SOLN
200.0000 mg | Freq: Once | INTRAVENOUS | Status: AC
  Administered 2019-10-07: 200 mg via INTRAVENOUS
  Filled 2019-10-07: qty 10

## 2019-10-07 MED ORDER — SODIUM CHLORIDE 0.9 % IV SOLN
Freq: Once | INTRAVENOUS | Status: AC
  Filled 2019-10-07: qty 250

## 2019-10-07 MED ORDER — OCTREOTIDE ACETATE 30 MG IM KIT
30.0000 mg | PACK | Freq: Once | INTRAMUSCULAR | Status: AC
  Administered 2019-10-07: 30 mg via INTRAMUSCULAR

## 2019-10-07 NOTE — Progress Notes (Signed)
[facility] OFFICE PROGRESS NOTE  [name], [name] [name], [name] [name], [name] [name], [name]  Cancer Staging No matching staging information was found for the patient.   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   # FEB 2018- Ga PET- uptake in liver/ mesenteric mass; NOV 2019- increase sandostatin to 56m q monthly.   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  Emma Journey89y.o.  female pleasant patient above  history of metastatic carcinoid tumor currently on Sandostatin/iron deficient anemia question AV malformation is here for follow-up.  Patient continues to be concerned about the side effects from [place]  She has not made up her mind.  She continues to complain of intermittent abdominal pain not constant.  Chronic mild diarrhea.  Chronic mild swelling of the legs.  No flushing.  Review of Systems  Constitutional: Positive for malaise/fatigue. Negative for chills, diaphoresis, fever and weight loss.  HENT: Negative for nosebleeds and sore throat.   Eyes: Negative for double vision.  Respiratory: Negative for hemoptysis, sputum production, shortness of breath and wheezing.   Cardiovascular: Positive for leg swelling. Negative for chest pain, palpitations and orthopnea.  Gastrointestinal: Positive for abdominal pain and diarrhea. Negative for constipation, heartburn, melena, nausea and vomiting.  Genitourinary: Negative for dysuria, frequency and urgency.  Musculoskeletal: Negative for back pain and joint pain.  Skin: Negative.  Negative for itching and rash.  Neurological: Negative for dizziness, tingling, focal weakness, weakness and headaches.  Endo/Heme/Allergies: Does not bruise/bleed easily.  Psychiatric/Behavioral: Negative for depression. The patient is not nervous/anxious and does not have insomnia.       PAST MEDICAL HISTORY :  Past Medical History:  Diagnosis Date  . Aromatase inhibitor use   . Breast cancer ([facility] 2014   left breast cancer/radiation  . Breast cancer, [place] breast ([place] 06/19/2014  . CHF (congestive heart failure) (HCC)    recent sepsis  . Chronic kidney disease   . Dizziness   . Dyspnea    recently while admitted  . GERD (gastroesophageal reflux disease)   . Hearing loss   . History  of hiatal hernia   . History of kidney stones   . HOH (hard of hearing)    severe  . Hypertension   . Hypothyroidism   . Leg DVT (deep venous thromboembolism), acute  ([place]) 07/2015   left leg after fracture  . 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 Pain    chest pain while admitted  . Personal history of radiation therapy 2015   left breast ca  . Thyroid disease     PAST SURGICAL HISTORY :   Past Surgical History:  Procedure Laterality Date  . ABDOMINAL HYSTERECTOMY     partial  . BREAST LUMPECTOMY Left 01/13/2013   invasive mammary carcinoma, clear margins, LN negative  . BREAST LUMPECTOMY WITH SENTINEL LYMPH NODE BIOPSY Left 2014  . CHOLECYSTECTOMY    . COLONOSCOPY WITH PROPOFOL N/A 09/17/2017   Procedure: COLONOSCOPY WITH PROPOFOL;  Surgeon: Jonathon Bellows, MD;  Location[facility] ENDOSCOPY;  Service: Gastroenterology;  Laterality: N/A;  . CYSTOSCOPY W/ URETERAL STENT PLACEMENT Right 01/07/2016   Procedure: CYSTOSCOPY WITH STENT REPLACEMENT;  Surgeon: Hollice Espy, MD;  Location: [facility] ORS;  Service: Urology;  Laterality: Right;  . CYSTOSCOPY WITH RETROGRADE PYELOGRAM, URETEROSCOPY AND STENT PLACEMENT Right 12/19/2015   Procedure: CYSTOSCOPY WITH RETROGRADE PYELOGRAM, URETEROSCOPY AND STENT PLACEMENT;  Surgeon: Ardis Hughs, MD;  Location: [facility] ORS;  Service: Urology;  Laterality: Right;  . URETEROSCOPY WITH HOLMIUM LASER LITHOTRIPSY Right 01/07/2016   Procedure: URETEROSCOPY WITH HOLMIUM LASER LITHOTRIPSY;  Surgeon: Hollice Espy, MD;  Location: [facility] ORS;  Service: Urology;  Laterality: Right;    FAMILY HISTORY :   Family History  Problem Relation Age of Onset  . Breast cancer Mother 34       deceased 40  . Breast cancer Maternal Aunt 68       deceased 34s  . Prostate cancer Father        deceased 70  . Colon cancer Paternal Aunt 80       deceased 13s    SOCIAL HISTORY:   Social History   Tobacco Use  . Smoking status: Never Smoker  . Smokeless tobacco: Never Used  Substance Use Topics  . Alcohol use: No    Alcohol/week: 0.0 standard drinks  . Drug use: No    ALLERGIES:  is allergic to sulfa antibiotics.  MEDICATIONS:  Current  Outpatient Medications  Medication Sig Dispense Refill  . Calcium Carb-Cholecalciferol (CALCIUM 600 + D PO) Take 1 tablet by mouth daily.    . furosemide (LASIX) 40 MG tablet Take 40 mg by mouth daily as needed for fluid or edema.     . hyoscyamine (ANASPAZ) 0.125 MG TBDP disintergrating tablet Place 0.25 mg under the tongue every 4 (four) hours as needed for cramping.    . labetalol (NORMODYNE) 200 MG tablet Take 200 mg by mouth 2 (two) times daily.     Marland Kitchen levothyroxine (SYNTHROID) 88 MCG tablet Take 88 mcg by mouth daily before breakfast.     . losartan-hydrochlorothiazide (HYZAAR) 100-25 MG tablet Take 1 tablet by mouth daily.    Marland Kitchen nystatin (MYCOSTATIN/NYSTOP) powder APPLY TO AFFECTED AREA TWICE A DAY 15 g 3  . octreotide (SANDOSTATIN LAR) 30 MG injection Inject 30 mg into the muscle every 28 (twenty-eight) days.     . potassium chloride SA (KLOR-CON M20) 20 MEQ tablet TAKE 1 TABLET BY MOUTH EVERY DAY 90 tablet 2  . Vitamin D, Ergocalciferol, (DRISDOL) 1.25 MG (50000 UNIT) CAPS capsule Take 1 capsule (50,000 Units total) by mouth once  a week. 12 capsule 3   No current facility-administered medications for this visit.    PHYSICAL EXAMINATION: ECOG PERFORMANCE STATUS: 1 - Symptomatic but completely ambulatory  BP (!) 123/54 (BP Location: Right Arm, Patient Position: Sitting, Cuff Size: Large)   Pulse (!) 59   Temp 98.2 F (36.8 C) (Tympanic)   Resp 16   Ht 5' 3" (1.6 m)   Wt 221 lb 12.8 oz (100.6 kg)   SpO2 95%   BMI 39.29 kg/m   Filed Weights   10/07/19 1341  Weight: 221 lb 12.8 oz (100.6 kg)    Physical Exam Constitutional:      Comments: Patient is a wheelchair.  She is alone.  Patient is hard of hearing.  HENT:     Head: Normocephalic and atraumatic.     Mouth/Throat:     Pharynx: No oropharyngeal exudate.  Eyes:     Pupils: Pupils are equal, round, and reactive to light.  Cardiovascular:     Rate and Rhythm: Normal rate and regular rhythm.  Pulmonary:     Effort:  Pulmonary effort is normal. No respiratory distress.     Breath sounds: Normal breath sounds. No wheezing.  Abdominal:     General: Bowel sounds are normal. There is no distension.     Palpations: Abdomen is soft. There is no mass.     Tenderness: There is no abdominal tenderness. There is no guarding or rebound.  Musculoskeletal:        General: Swelling present. No tenderness. Normal range of motion.     Cervical back: Normal range of motion and neck supple.  Skin:    General: Skin is warm.  Neurological:     Mental Status: She is alert and oriented to person, place, and time.  Psychiatric:        Mood and Affect: Affect normal.     LABORATORY DATA:  I have reviewed the data as listed    Component Value Date/Time   NA 136 10/07/2019 1319   NA 137 07/12/2013 0841   K 3.5 10/07/2019 1319   K 4.7 07/12/2013 0841   CL 102 10/07/2019 1319   CL 107 07/12/2013 0841   CO2 21 (L) 10/07/2019 1319   CO2 20 (L) 07/12/2013 0841   GLUCOSE 133 (H) 10/07/2019 1319   GLUCOSE 122 (H) 07/12/2013 0841   BUN 12 10/07/2019 1319   BUN 18 07/12/2013 0841   CREATININE 0.82 10/07/2019 1319   CREATININE 0.64 05/22/2014 1111   CALCIUM 8.8 (L) 10/07/2019 1319   CALCIUM 9.2 07/12/2013 0841   PROT 7.0 10/07/2019 1319   PROT 7.0 05/22/2014 1111   ALBUMIN 3.8 10/07/2019 1319   ALBUMIN 4.2 05/22/2014 1111   AST 26 10/07/2019 1319   AST 29 05/22/2014 1111   ALT 16 10/07/2019 1319   ALT 17 05/22/2014 1111   ALKPHOS 80 10/07/2019 1319   ALKPHOS 95 05/22/2014 1111   BILITOT 0.9 10/07/2019 1319   BILITOT 0.8 05/22/2014 1111   GFRNONAA >60 10/07/2019 1319   GFRNONAA >60 05/22/2014 1111   GFRAA >60 10/07/2019 1319   GFRAA >60 05/22/2014 1111    No results found for: SPEP, UPEP  Lab Results  Component Value Date   WBC 6.6 10/07/2019   NEUTROABS 4.8 10/07/2019   HGB 12.7 10/07/2019   HCT 36.7 10/07/2019   MCV 88.6 10/07/2019   PLT 225 10/07/2019      Chemistry      Component Value  Date/Time   NA  136 10/07/2019 1319   NA 137 07/12/2013 0841   K 3.5 10/07/2019 1319   K 4.7 07/12/2013 0841   CL 102 10/07/2019 1319   CL 107 07/12/2013 0841   CO2 21 (L) 10/07/2019 1319   CO2 20 (L) 07/12/2013 0841   BUN 12 10/07/2019 1319   BUN 18 07/12/2013 0841   CREATININE 0.82 10/07/2019 1319   CREATININE 0.64 05/22/2014 1111      Component Value Date/Time   CALCIUM 8.8 (L) 10/07/2019 1319   CALCIUM 9.2 07/12/2013 0841   ALKPHOS 80 10/07/2019 1319   ALKPHOS 95 05/22/2014 1111   AST 26 10/07/2019 1319   AST 29 05/22/2014 1111   ALT 16 10/07/2019 1319   ALT 17 05/22/2014 1111   BILITOT 0.9 10/07/2019 1319   BILITOT 0.8 05/22/2014 1111       RADIOGRAPHIC STUDIES: I have personally reviewed the radiological images as listed and agreed with the findings in the report. No results found.   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  #Continue Sandostatin on a monthly basis.  Given the intermittent abdominal pain question secondary to mesenteric mass-again recommend proceeding with Lutathera.  Patient is reluctant given the concerns for side effects.  She will inform of her decision at next visit.  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  Gunnar Fusi [id number]- Iron sat-9%]; proceed with IV Venofer.  Hemoglobin 12.  # chronic mild diarrhea- ? Sec to carcinoid [not on xarmelo secondary insurance issues-stable. # DISPOSITION- # Sandostatin today;venofer.  # Follow up in 1  month; MD cbc/cmp;-Sandstatin; possible venofer;  Dr.B  Addendum: Discussed with patient's daughter concerned about depression/anxiety.  Also recommend talking  to mother about Lutathera/also in general safety of [place].  Recommend palliative care evaluation next visit.     Orders Placed This Encounter  Procedures  . CBC with Differential    Standing Status:   Future    Standing Expiration Date:   10/06/2020  . Comprehensive metabolic panel    Standing Status:   Future    Standing Expiration Date:   10/06/2020   All questions were answered. The patient knows to call the clinic with any problems, questions or concerns.      [name], MD 10/11/2019 12:45 PM

## 2019-10-07 NOTE — Assessment & Plan Note (Addendum)
#   Non- functional Small bowel/mesenteric carcinoid; STAGE IV-on octreotide injections monthly; July 2021-gallium PET scan [compared to January -Overall stable/no evidence of obvious progression; however slight increase in size of the mesenteric mass/liver lesion by few millimeters; also slight increase in SUV uptake.  #Continue Sandostatin on a monthly basis.  Given the intermittent abdominal pain question secondary to mesenteric mass-again recommend proceeding with Lutathera.  Patient is reluctant given the concerns for side effects.  She will inform of her decision at next visit.  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  Gunnar Fusi [id number]- Iron sat-9%]; proceed with IV Venofer.  Hemoglobin 12.  # chronic mild diarrhea- ? Sec to carcinoid [not on xarmelo secondary insurance issues-stable. # DISPOSITION- # Sandostatin today;venofer.  # Follow up in 1  month; MD cbc/cmp;-Sandstatin; possible venofer;  Dr.B  Addendum: Discussed with patient's daughter concerned about depression/anxiety.  Also recommend talking to mother about Lutathera/also in general safety of [place].  Recommend palliative care evaluation next visit.

## 2019-10-10 ENCOUNTER — Telehealth: Payer: Self-pay | Admitting: Internal Medicine

## 2019-10-10 NOTE — Telephone Encounter (Signed)
Emma [name] - Please arrange for apt with Josh at next visit.

## 2019-10-10 NOTE — Telephone Encounter (Signed)
On 9/10-spoke to patient's daughter [name] regarding patient's visit.  Discussed about patient reluctance with Lutathera therapy/concern for side effects was discussed.  [name] concerned about-patient's anxiety/depression.  Recommend palliative care evaluation at next visit  [place]-please schedule palliative care appointment with Josh at next visit.

## 2019-10-13 ENCOUNTER — Other Ambulatory Visit: Payer: Self-pay | Admitting: Internal Medicine

## 2019-10-13 DIAGNOSIS — Z1231 Encounter for screening mammogram for malignant neoplasm of breast: Secondary | ICD-10-CM

## 2019-11-09 ENCOUNTER — Inpatient Hospital Stay (HOSPITAL_BASED_OUTPATIENT_CLINIC_OR_DEPARTMENT_OTHER): Payer: Medicare Other | Admitting: Hospice and Palliative Medicine

## 2019-11-09 ENCOUNTER — Inpatient Hospital Stay: Payer: Medicare Other | Attending: Internal Medicine

## 2019-11-09 ENCOUNTER — Inpatient Hospital Stay (HOSPITAL_BASED_OUTPATIENT_CLINIC_OR_DEPARTMENT_OTHER): Payer: Medicare Other | Admitting: Internal Medicine

## 2019-11-09 ENCOUNTER — Other Ambulatory Visit: Payer: Self-pay

## 2019-11-09 ENCOUNTER — Encounter: Payer: Self-pay | Admitting: Internal Medicine

## 2019-11-09 ENCOUNTER — Inpatient Hospital Stay: Payer: Medicare Other

## 2019-11-09 DIAGNOSIS — C7B02 Secondary carcinoid tumors of liver: Secondary | ICD-10-CM | POA: Insufficient documentation

## 2019-11-09 DIAGNOSIS — E340 Carcinoid syndrome: Secondary | ICD-10-CM | POA: Diagnosis not present

## 2019-11-09 DIAGNOSIS — K6389 Other specified diseases of intestine: Secondary | ICD-10-CM

## 2019-11-09 DIAGNOSIS — C7A019 Malignant carcinoid tumor of the small intestine, unspecified portion: Secondary | ICD-10-CM | POA: Insufficient documentation

## 2019-11-09 DIAGNOSIS — E039 Hypothyroidism, unspecified: Secondary | ICD-10-CM | POA: Diagnosis not present

## 2019-11-09 DIAGNOSIS — Z515 Encounter for palliative care: Secondary | ICD-10-CM | POA: Diagnosis not present

## 2019-11-09 LAB — COMPREHENSIVE METABOLIC PANEL
ALT: 21 U/L (ref 0–44)
AST: 29 U/L (ref 15–41)
Albumin: 4 g/dL (ref 3.5–5.0)
Alkaline Phosphatase: 72 U/L (ref 38–126)
Anion gap: 10 (ref 5–15)
BUN: 15 mg/dL (ref 8–23)
CO2: 22 mmol/L (ref 22–32)
Calcium: 8.9 mg/dL (ref 8.9–10.3)
Chloride: 106 mmol/L (ref 98–111)
Creatinine, Ser: 0.69 mg/dL (ref 0.44–1.00)
GFR, Estimated: >60 mL/min (ref >60)
Glucose, Bld: 128 mg/dL — ABNORMAL HIGH (ref 70–99)
Potassium: 3.4 mmol/L — ABNORMAL LOW (ref 3.5–5.1)
Sodium: 138 mmol/L (ref 135–145)
Total Bilirubin: 1.1 mg/dL (ref 0.3–1.2)
Total Protein: 6.9 g/dL (ref 6.5–8.1)

## 2019-11-09 LAB — CBC WITH DIFFERENTIAL/PLATELET
Abs Immature Granulocytes: 0.03 10*3/uL (ref 0.00–0.07)
Basophils Absolute: 0 10*3/uL (ref 0.0–0.1)
Basophils Relative: 1 %
Eosinophils Absolute: 0.4 10*3/uL (ref 0.0–0.5)
Eosinophils Relative: 6 %
HCT: 38.4 % (ref 36.0–46.0)
Hemoglobin: 13 g/dL (ref 12.0–15.0)
Immature Granulocytes: 1 %
Lymphocytes Relative: 11 %
Lymphs Abs: 0.7 10*3/uL (ref 0.7–4.0)
MCH: 30.3 pg (ref 26.0–34.0)
MCHC: 33.9 g/dL (ref 30.0–36.0)
MCV: 89.5 fL (ref 80.0–100.0)
Monocytes Absolute: 0.8 10*3/uL (ref 0.1–1.0)
Monocytes Relative: 12 %
Neutro Abs: 4.6 10*3/uL (ref 1.7–7.7)
Neutrophils Relative %: 69 %
Platelets: 182 10*3/uL (ref 150–400)
RBC: 4.29 MIL/uL (ref 3.87–5.11)
RDW: 14.5 % (ref 11.5–15.5)
WBC: 6.6 10*3/uL (ref 4.0–10.5)
nRBC: 0 % (ref 0.0–0.2)

## 2019-11-09 MED ORDER — OCTREOTIDE ACETATE 30 MG IM KIT
30.0000 mg | PACK | Freq: Once | INTRAMUSCULAR | Status: AC
  Administered 2019-11-09: 30 mg via INTRAMUSCULAR
  Filled 2019-11-09: qty 1

## 2019-11-09 NOTE — Assessment & Plan Note (Addendum)
#   Non- functional Small bowel/mesenteric carcinoid; STAGE IV-on octreotide injections monthly; July 2021-gallium PET scan [compared to January -Overall stable/no evidence of obvious progression; however slight increase in size of the mesenteric mass/liver lesion by few millimeters; also slight increase in SUV uptake.  Clinically stable.  #Continue Sandostatin on a monthly basis.  Patient reluctant/declines Lutathera therapy.   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  Gunnar Fusi [id number]- Iron sat-9%];HOLD IV Venofer.  Hemoglobin 13.   # chronic mild diarrhea- ? Sec to carcinoid [not on xarmelo secondary insurance issues-chronic stable.  I spoke at length with the patient's granddaughter regarding the patient's clinical status/plan of care.  Family agreement.    # DISPOSITION- # Sandostatin today # HOLD Venofer # Follow up in 1  month; MD cbc/cmp;-Sandstatin; possible venofer;  Dr.B

## 2019-11-09 NOTE — Progress Notes (Signed)
[place]  Telephone:([phone] [phone]   Name: ASTELLA DESIR Date: 11/09/2019 MRN: [id number]  DOB: 06-27-1940  [name], [name] [name], [name] [name], [name] [name], [name]    REASON FOR CONSULTATION: Emma [name] is a 79 y.o. 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  Patient has chronic symptoms including diarrhea and intermittent abdominal pain.  She was referred to palliative care to help address goals and manage ongoing symptoms.  SOCIAL HISTORY:     reports that she has never smoked. She has never used smokeless tobacco. She reports that she does not drink alcohol and does not use drugs.   Patient is widowed.  She lives at home with her daughter and granddaughter.  She has another daughter who lives nearby.  Patient had a variety of jobs including textiles.  ADVANCE DIRECTIVES:  Does not have  CODE STATUS:   PAST MEDICAL HISTORY: Past Medical History:  Diagnosis Date  . Aromatase inhibitor use   . Breast cancer ([place]) 2014   left breast cancer/radiation  . Breast cancer, left breast ([place]) 06/19/2014  . CHF (congestive heart failure) (HCC)    recent sepsis  . Chronic kidney disease   . Dizziness   . Dyspnea    recently while admitted  . GERD (gastroesophageal reflux disease)   . Hearing loss   . History of hiatal hernia   . History of kidney stones   . HOH (hard of hearing)    severe  . Hypertension   . Hypothyroidism   . Leg DVT (deep venous thromboembolism), acute ([place]) 07/2015   left leg after fracture  . 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 Pain    chest pain while admitted  . Personal history of radiation therapy  2015   left breast ca  . Thyroid disease     PAST SURGICAL HISTORY:  Past Surgical History:  Procedure Laterality Date  . ABDOMINAL HYSTERECTOMY     partial  . BREAST LUMPECTOMY Left 01/13/2013   invasive mammary carcinoma, clear margins, LN negative  . BREAST LUMPECTOMY WITH SENTINEL LYMPH NODE BIOPSY Left 2014  . CHOLECYSTECTOMY    . COLONOSCOPY WITH PROPOFOL N/A 09/17/2017   Procedure: COLONOSCOPY WITH PROPOFOL;  Surgeon: Jonathon Bellows, MD;  Location[facility];  Service: Gastroenterology;  Laterality: N/A;  . CYSTOSCOPY W/ URETERAL STENT PLACEMENT Right 01/07/2016   Procedure: CYSTOSCOPY WITH STENT REPLACEMENT;  Surgeon: Hollice Espy, MD;  Location: [facility] ORS;  Service: Urology;  Laterality: Right;  . CYSTOSCOPY WITH RETROGRADE PYELOGRAM, URETEROSCOPY AND STENT PLACEMENT Right 12/19/2015   Procedure: CYSTOSCOPY WITH RETROGRADE PYELOGRAM, URETEROSCOPY AND STENT PLACEMENT;  Surgeon: Ardis Hughs, MD;  Location: [facility] ORS;  Service: Urology;  Laterality: Right;  . URETEROSCOPY WITH HOLMIUM LASER LITHOTRIPSY Right 01/07/2016   Procedure: URETEROSCOPY WITH HOLMIUM LASER LITHOTRIPSY;  Surgeon: Hollice Espy, MD;  Location: [facility] ORS;  Service: Urology;  Laterality: Right;    HEMATOLOGY/ONCOLOGY HISTORY:  Oncology History Overview Note  # JUNE 2015-MESENTERIC MASS [~5-6cm] Presumed CARCINOID with mets to liver [AUG 2016-Octreoscan; Dr.Hurwitz; [facility]]; FEB 2016- START OCTREOTIDE LAR qM; Octreo scan- FEB 2017- STABLE; cont Octreotide'; OCT 5th OCTREO SCAN- STABLE; mesenteric mass present.   # FEB 2018- Ga PET- uptake in liver/ mesenteric mass; NOV  2019- increase sandostatin to 38m q monthly.   # Jan/FEB 2018- Liver MRI- "fat sparing" Bx- neg; no further wu recom  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] s/p Lumpec & RT; ER/PR > 90%; Her2 Neu-NEG; Arimidex; MAY 2016- Mammo-NEG; [until summer 2020]  # IDA s/p IV iron; last March 2014; Colo [Dr.Elliot; Jan 2016] colon  angiotele- s/p Argon laser; April 2017- Ferrahem  # DVT [taken off eliquis DEC 2017]; NOV -DEC 2017 CHF/ UTI with sepsis- stone extracted [Dr.Brandon]  # Thyroid nodule- MNG s/p Bx [NOV 2016]/ right adnexal mass [stable since 2010]; hard of hearing  MOLECULAR TESTING- Not done  GENETIC TESTING- [place] ? 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  MEDICATIONS:  Current Outpatient Medications  Medication Sig Dispense Refill  . Calcium Carb-Cholecalciferol (CALCIUM 600 + D PO) Take 1 tablet by mouth daily.    . furosemide (LASIX) 40 MG tablet Take 40 mg by mouth daily as needed for fluid or edema.     . hyoscyamine (ANASPAZ) 0.125 MG TBDP disintergrating tablet Place 0.25 mg under the tongue every 4 (four) hours as needed for cramping.    . labetalol (NORMODYNE) 200 MG tablet Take 200 mg by mouth 2 (two) times daily.     .Marland Kitchenlevothyroxine (SYNTHROID) 88 MCG tablet Take 88 mcg by mouth daily before breakfast.     . losartan-hydrochlorothiazide (HYZAAR) 100-25 MG tablet Take 1 tablet by mouth daily.    .Marland Kitchennystatin (MYCOSTATIN/NYSTOP) powder APPLY TO AFFECTED AREA TWICE A DAY 15 g 3  . octreotide (SANDOSTATIN LAR) 30 MG injection Inject 30 mg into the muscle every 28 (twenty-eight) days.     . potassium chloride SA (KLOR-CON M20) 20 MEQ tablet TAKE 1 TABLET BY MOUTH EVERY DAY 90 tablet 2  . Vitamin D, Ergocalciferol, (DRISDOL) 1.25 MG (50000 UNIT) CAPS capsule Take 1 capsule (50,000 Units total) by mouth once a week. 12 capsule 3   No current facility-administered medications for this  visit.   Facility-Administered Medications Ordered in Other Visits  Medication Dose Route Frequency Provider Last Rate Last Admin  . octreotide (SANDOSTATIN LAR) IM injection 30 mg  30 mg Intramuscular Once BCammie Sickle MD        VITAL SIGNS: There were no vitals taken for this visit. There were no vitals filed for this visit.  Estimated body mass index is 38.09 kg/m as calculated from the following:   Height as of an earlier encounter on 11/09/19: _1  (1.6 m).   Weight as of an earlier encounter on 11/09/19: 215 lb (97.5 kg).  LABS: CBC:    Component Value Date/Time   WBC 6.6 11/09/2019 1254   HGB 13.0 11/09/2019 1254   HGB 11.7 (L) 05/22/2014 1111   HCT 38.4 11/09/2019 1254   HCT 36.4 05/22/2014 1111   PLT 182 11/09/2019 1254   PLT 173 05/22/2014 1111   MCV 89.5 11/09/2019 1254   MCV 78 (L) 05/22/2014 1111   NEUTROABS 4.6 11/09/2019 1254   NEUTROABS 4.1 05/22/2014 1111   LYMPHSABS 0.7 11/09/2019 1254   LYMPHSABS 0.6 (L) 05/22/2014  1111   MONOABS 0.8 11/09/2019 1254   MONOABS 0.6 05/22/2014 1111   EOSABS 0.4 11/09/2019 1254   EOSABS 0.3 05/22/2014 1111   BASOSABS 0.0 11/09/2019 1254   BASOSABS 0.1 05/22/2014 1111   Comprehensive Metabolic Panel:    Component Value Date/Time   NA 138 11/09/2019 1254   NA 137 07/12/2013 0841   K 3.4 (L) 11/09/2019 1254   K 4.7 07/12/2013 0841   CL 106 11/09/2019 1254   CL 107 07/12/2013 0841   CO2 22 11/09/2019 1254   CO2 20 (L) 07/12/2013 0841   BUN 15 11/09/2019 1254   BUN 18 07/12/2013 0841   CREATININE 0.69 11/09/2019 1254   CREATININE 0.64 05/22/2014 1111   GLUCOSE 128 (H) 11/09/2019 1254   GLUCOSE 122 (H) 07/12/2013 0841   CALCIUM 8.9 11/09/2019 1254   CALCIUM 9.2 07/12/2013 0841   AST 29 11/09/2019 1254   AST 29 05/22/2014 1111   ALT 21 11/09/2019 1254   ALT 17 05/22/2014 1111   ALKPHOS 72 11/09/2019 1254   ALKPHOS 95 05/22/2014 1111   BILITOT 1.1 11/09/2019 1254   BILITOT 0.8 05/22/2014 1111   PROT  6.9 11/09/2019 1254   PROT 7.0 05/22/2014 1111   ALBUMIN 4.0 11/09/2019 1254   ALBUMIN 4.2 05/22/2014 1111    RADIOGRAPHIC STUDIES: No results found.  PERFORMANCE STATUS (ECOG) : 2 - Symptomatic, <50% confined to bed  Review of Systems Unless otherwise noted, a complete review of systems is negative.  Physical Exam General: NAD Pulmonary: Unlabored Extremities: no edema, no joint deformities Skin: no rashes Neurological: Weakness, extremely hard of hearing  IMPRESSION: I met with patient and her granddaughter.  I introduced palliative care services and attempted to establish therapeutic rapport.  Communication was limited by patient's extreme difficulty with hearing.  Patient reports that overall she feels she is doing well.  She denies any significant symptoms today other than her chronic diarrhea.  Patient is currently on Sandostatin for management.  She has been recommended Lutathera but is reluctant and fearful about the side effects.  At baseline, patient lives at home with her daughter and granddaughter.  She is ambulatory with use of a walker.  She has history of bilateral leg fractures but no recent falls.  Patient is mostly independent with her own care but is not left alone per family.  Patient has no ACP documents but these were reviewed and sent home with her today.  Also reviewed a MOST form, which patient took him to discuss with family.  PLAN: -Continue current scope of treatment -ACP/MOST form reviewed -RTC in 1 month   Patient expressed understanding and was in agreement with this plan. She also understands that She can call the clinic at any time with any questions, concerns, or complaints.     Time Total: 15 minutes  Visit consisted of counseling and education dealing with the complex and emotionally intense issues of symptom management and palliative care in the setting of serious and potentially life-threatening illness.Greater than 50%  of this time  was spent counseling and coordinating care related to the above assessment and plan.  Signed by: Altha Harm, PhD, NP-C

## 2019-11-09 NOTE — Progress Notes (Signed)
[place] OFFICE PROGRESS NOTE  [name], [name] [name], [name] [name], [name] [name], [name]  Cancer Staging No matching staging information was found for the patient.   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   # FEB 2018- Ga PET- uptake in liver/ mesenteric mass; NOV 2019- increase sandostatin to 17m q monthly.   # Jan/FEB 2018- Liver MRI- "fat sparing" Bx- neg; no further wu recom  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] s/p Lumpec & RT; ER/PR > 90%; Her2 Neu-NEG; Arimidex; MAY 2016- Mammo-NEG; [until summer 2020]  # IDA s/p IV iron; last March 2014; [place] [Dr.Elliot; Jan 2016] colon angiotele- s/p Argon laser; April 2017- Ferrahem  # DVT [taken off eliquis DEC 2017]; NOV -DEC 2017 CHF/ UTI with sepsis- stone extracted [Dr.Brandon]  # Thyroid nodule- MNG s/p Bx [NOV 2016]/ right adnexal mass [stable since 2010]; hard of hearing  MOLECULAR TESTING- Not done  GENETIC TESTING- [place] ? 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  [name].[id number].  female pleasant patient above  history of metastatic carcinoid tumor currently on Sandostatin/iron deficient anemia question AV malformation is here for follow-up.  Patient continues to have chronic intermittent abdominal pain.  Also complains of chronic mild diarrhea chronic swelling in legs.  Denies any flushing.  Review of Systems  Constitutional: Positive for malaise/fatigue. Negative for chills, diaphoresis, fever and weight loss.  HENT: Negative for nosebleeds and sore throat.   Eyes: Negative for double vision.  Respiratory: Negative for hemoptysis, sputum production, shortness of breath and wheezing.   Cardiovascular: Positive for leg swelling. Negative for chest pain, palpitations and orthopnea.  Gastrointestinal: Positive for abdominal pain and diarrhea. Negative for constipation, heartburn, melena, nausea and vomiting.  Genitourinary: Negative for dysuria, frequency and urgency.  Musculoskeletal: Negative for back pain and joint pain.  Skin: Negative.  Negative for itching and rash.  Neurological: Negative for dizziness, tingling, focal weakness, weakness and headaches.  Endo/Heme/Allergies: Does not bruise/bleed easily.  Psychiatric/Behavioral: Negative for depression. The patient is not nervous/anxious and does not have insomnia.       PAST MEDICAL HISTORY :  Past Medical History:  Diagnosis Date  . Aromatase inhibitor use   . Breast [place]   left breast cancer/radiation  . [place] breast [place]  . CHF (congestive heart failure) (HCC)    recent sepsis  . Chronic kidney disease   . Dizziness   . Dyspnea    recently while admitted  . GERD (gastroesophageal reflux disease)   . Hearing loss   . History of hiatal hernia   . History of kidney stones   . HOH (hard of hearing)    severe  .  Hypertension   . Hypothyroidism   . Leg DVT (deep venous thromboembolism), acute ([place]) 07/2015   left leg after fracture  . 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 Pain    chest pain while  admitted  . Personal history of radiation therapy 2015   left breast ca  . Thyroid disease     PAST SURGICAL HISTORY :   Past Surgical History:  Procedure Laterality Date  . ABDOMINAL HYSTERECTOMY     partial  . BREAST LUMPECTOMY Left 01/13/2013   invasive mammary carcinoma, clear margins, LN negative  . BREAST LUMPECTOMY WITH SENTINEL LYMPH NODE BIOPSY Left 2014  . CHOLECYSTECTOMY    . COLONOSCOPY WITH PROPOFOL N/A 09/17/2017   Procedure: COLONOSCOPY WITH PROPOFOL;  Surgeon: Jonathon Bellows, MD;  Location: [facility] ENDOSCOPY;  Service: Gastroenterology;  Laterality: N/A;  . CYSTOSCOPY W/ URETERAL STENT PLACEMENT Right 01/07/2016   Procedure: CYSTOSCOPY WITH STENT REPLACEMENT;  Surgeon: Hollice Espy, MD;  Location: [facility] ORS;  Service: Urology;  Laterality: Right;  . CYSTOSCOPY WITH RETROGRADE PYELOGRAM, URETEROSCOPY AND STENT PLACEMENT Right 12/19/2015   Procedure: CYSTOSCOPY WITH RETROGRADE PYELOGRAM, URETEROSCOPY AND STENT PLACEMENT;  Surgeon: Ardis Hughs, MD;  Location: [facility] ORS;  Service: Urology;  Laterality: Right;  . URETEROSCOPY WITH HOLMIUM LASER LITHOTRIPSY Right 01/07/2016   Procedure: URETEROSCOPY WITH HOLMIUM LASER LITHOTRIPSY;  Surgeon: Hollice Espy, MD;  Location: [facility] ORS;  Service: Urology;  Laterality: Right;    FAMILY HISTORY :   Family History  Problem Relation Age of Onset  . Breast cancer Mother 36       deceased 55  . Breast cancer Maternal Aunt 68       deceased 95s  . Prostate cancer Father        deceased 58  . Colon cancer Paternal Aunt 80       deceased 46s    SOCIAL HISTORY:   Social History   Tobacco Use  . Smoking status: Never Smoker  . Smokeless tobacco: Never Used  Substance Use Topics  . Alcohol use: No    Alcohol/week: 0.0 standard drinks  . Drug use: No    ALLERGIES:  is allergic to sulfa antibiotics.  MEDICATIONS:  Current Outpatient Medications  Medication Sig Dispense Refill  . Calcium Carb-Cholecalciferol (CALCIUM 600 +  D PO) Take 1 tablet by mouth daily.    . furosemide (LASIX) 40 MG tablet Take 40 mg by mouth daily as needed for fluid or edema.     . hyoscyamine (ANASPAZ) 0.125 MG TBDP disintergrating tablet Place 0.25 mg under the tongue every 4 (four) hours as needed for cramping.    . labetalol (NORMODYNE) 200 MG tablet Take 200 mg by mouth 2 (two) times daily.     Marland Kitchen levothyroxine (SYNTHROID) 88 MCG tablet Take 88 mcg by mouth daily before breakfast.     . losartan-hydrochlorothiazide (HYZAAR) 100-25 MG tablet Take 1 tablet by mouth daily.    Marland Kitchen nystatin (MYCOSTATIN/NYSTOP) powder APPLY TO AFFECTED AREA TWICE A DAY 15 g 3  . octreotide (SANDOSTATIN LAR) 30 MG injection Inject 30 mg into the muscle every 28 (twenty-eight) days.     . potassium chloride SA (KLOR-CON M20) 20 MEQ tablet TAKE 1 TABLET BY MOUTH EVERY DAY 90 tablet 2  . Vitamin D, Ergocalciferol, (DRISDOL) 1.25 MG (50000 UNIT) CAPS capsule Take 1 capsule (50,000 Units total) by mouth once a week. 12 capsule 3   No current facility-administered medications for this visit.    PHYSICAL EXAMINATION: ECOG PERFORMANCE STATUS: 1 -  Symptomatic but completely ambulatory  BP (!) 153/59 (BP Location: Right Arm, Patient Position: Sitting, Cuff Size: Large)   Pulse (!) 54   Temp 97.7 F (36.5 C) (Tympanic)   Resp 16   Ht 5' 3"  (1.6 m)   Wt 215 lb (97.5 kg)   SpO2 96%   BMI 38.09 kg/m   Filed Weights   11/09/19 1310  Weight: 215 lb (97.5 kg)    Physical Exam Constitutional:      Comments: Patient is a wheelchair.  She is alone.  Patient is hard of hearing.  HENT:     Head: Normocephalic and atraumatic.     Mouth/Throat:     Pharynx: No oropharyngeal exudate.  Eyes:     Pupils: Pupils are equal, round, and reactive to light.  Cardiovascular:     Rate and Rhythm: Normal rate and regular rhythm.  Pulmonary:     Effort: Pulmonary effort is normal. No respiratory distress.     Breath sounds: Normal breath sounds. No wheezing.  Abdominal:      General: Bowel sounds are normal. There is no distension.     Palpations: Abdomen is soft. There is no mass.     Tenderness: There is no abdominal tenderness. There is no guarding or rebound.  Musculoskeletal:        General: Swelling present. No tenderness. Normal range of motion.     Cervical back: Normal range of motion and neck supple.  Skin:    General: Skin is warm.  Neurological:     Mental Status: She is alert and oriented to person, place, and time.  Psychiatric:        Mood and Affect: Affect normal.     LABORATORY DATA:  I have reviewed the data as listed    Component Value Date/Time   NA 138 11/09/2019 1254   NA 137 07/12/2013 0841   K 3.4 (L) 11/09/2019 1254   K 4.7 07/12/2013 0841   CL 106 11/09/2019 1254   CL 107 07/12/2013 0841   CO2 22 11/09/2019 1254   CO2 20 (L) 07/12/2013 0841   GLUCOSE 128 (H) 11/09/2019 1254   GLUCOSE 122 (H) 07/12/2013 0841   BUN 15 11/09/2019 1254   BUN 18 07/12/2013 0841   CREATININE 0.69 11/09/2019 1254   CREATININE 0.64 05/22/2014 1111   CALCIUM 8.9 11/09/2019 1254   CALCIUM 9.2 07/12/2013 0841   PROT 6.9 11/09/2019 1254   PROT 7.0 05/22/2014 1111   ALBUMIN 4.0 11/09/2019 1254   ALBUMIN 4.2 05/22/2014 1111   AST 29 11/09/2019 1254   AST 29 05/22/2014 1111   ALT 21 11/09/2019 1254   ALT 17 05/22/2014 1111   ALKPHOS 72 11/09/2019 1254   ALKPHOS 95 05/22/2014 1111   BILITOT 1.1 11/09/2019 1254   BILITOT 0.8 05/22/2014 1111   GFRNONAA >60 11/09/2019 1254   GFRNONAA >60 05/22/2014 1111   GFRAA >60 10/07/2019 1319   GFRAA >60 05/22/2014 1111    No results found for: SPEP, UPEP  Lab Results  Component Value Date   WBC 6.6 11/09/2019   NEUTROABS 4.6 11/09/2019   HGB 13.0 11/09/2019   HCT 38.4 11/09/2019   MCV 89.5 11/09/2019   PLT 182 11/09/2019      Chemistry      Component Value Date/Time   NA 138 11/09/2019 1254   NA 137 07/12/2013 0841   K 3.4 (L) 11/09/2019 1254   K 4.7 07/12/2013 0841   CL 106  11/09/2019 1254  CL 107 07/12/2013 0841   CO2 22 11/09/2019 1254   CO2 20 (L) 07/12/2013 0841   BUN 15 11/09/2019 1254   BUN 18 07/12/2013 0841   CREATININE 0.69 11/09/2019 1254   CREATININE 0.64 05/22/2014 1111      Component Value Date/Time   CALCIUM 8.9 11/09/2019 1254   CALCIUM 9.2 07/12/2013 0841   ALKPHOS 72 11/09/2019 1254   ALKPHOS 95 05/22/2014 1111   AST 29 11/09/2019 1254   AST 29 05/22/2014 1111   ALT 21 11/09/2019 1254   ALT 17 05/22/2014 1111   BILITOT 1.1 11/09/2019 1254   BILITOT 0.8 05/22/2014 1111       RADIOGRAPHIC STUDIES: I have personally reviewed the radiological images as listed and agreed with the findings in the report. No results found.   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  Clinically stable.  #Continue Sandostatin on a monthly basis.  Patient reluctant/declines Lutathera therapy.   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  Gunnar Fusi [id number]- Iron sat-9%];HOLD IV Venofer.  Hemoglobin 13.   # chronic mild diarrhea- ? Sec to carcinoid [not on xarmelo secondary insurance issues-chronic stable.  I spoke at length with the patient's granddaughter regarding the patient's clinical status/plan of care.  Family agreement.    # DISPOSITION- # Sandostatin today # HOLD Venofer # Follow up in 1  month; MD cbc/cmp;-Sandstatin; possible venofer;  Dr.B     No orders of the defined types were placed in this encounter.  All questions were answered. The patient knows to call the clinic with any problems, questions or concerns.      [name], MD 11/10/2019 8:48 AM

## 2019-11-21 ENCOUNTER — Ambulatory Visit
Admission: RE | Admit: 2019-11-21 | Discharge: 2019-11-21 | Disposition: A | Discharge status: Home / Self Care | Payer: Medicare Other | Source: Ambulatory Visit | Attending: Internal Medicine | Admitting: Internal Medicine

## 2019-11-21 ENCOUNTER — Other Ambulatory Visit: Payer: Self-pay

## 2019-11-21 DIAGNOSIS — Z1231 Encounter for screening mammogram for malignant neoplasm of breast: Secondary | ICD-10-CM | POA: Diagnosis not present

## 2019-11-21 IMAGING — MG DIGITAL SCREENING BILAT W/ TOMO W/ CAD
8 of 14 series · 8 of 40 positions shown · non-contrast
Comparison: Previous exam(s).

ACR Breast Density Category a: The breast tissue is almost entirely
fatty.

CLINICAL DATA: Screening.

EXAM:
DIGITAL SCREENING BILATERAL MAMMOGRAM WITH TOMO AND CAD

[L CC synth-2D (1 of 2)]
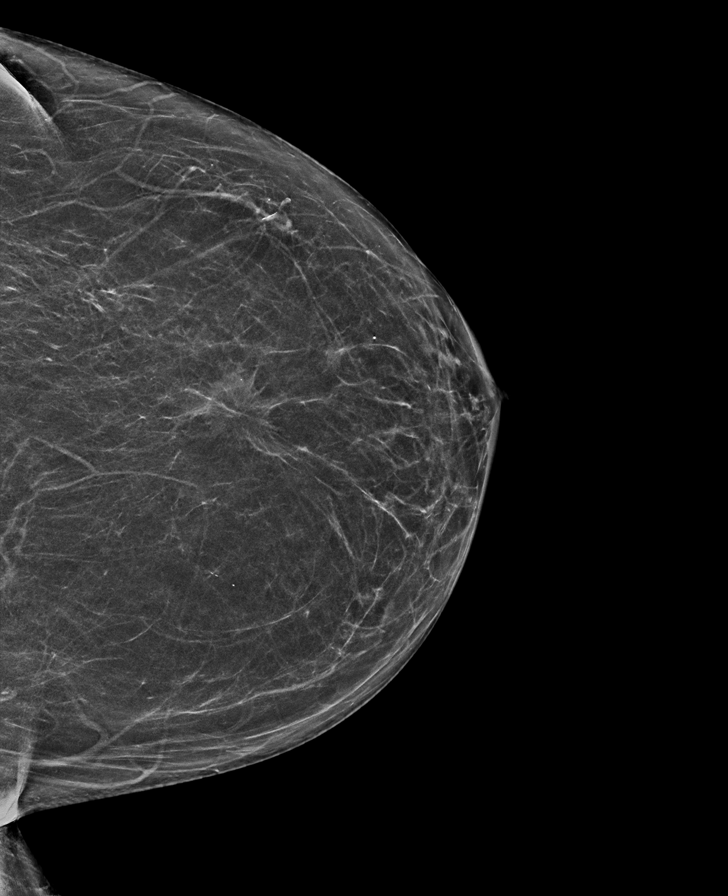

[L CC synth-2D (2 of 2)]
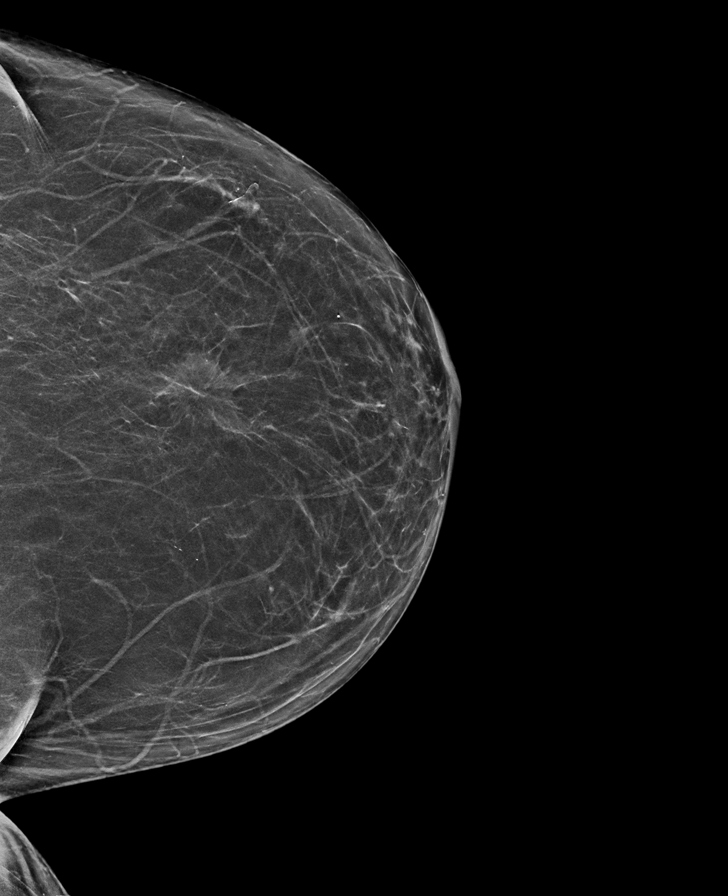

[R MLO synth-2D (1 of 2)]
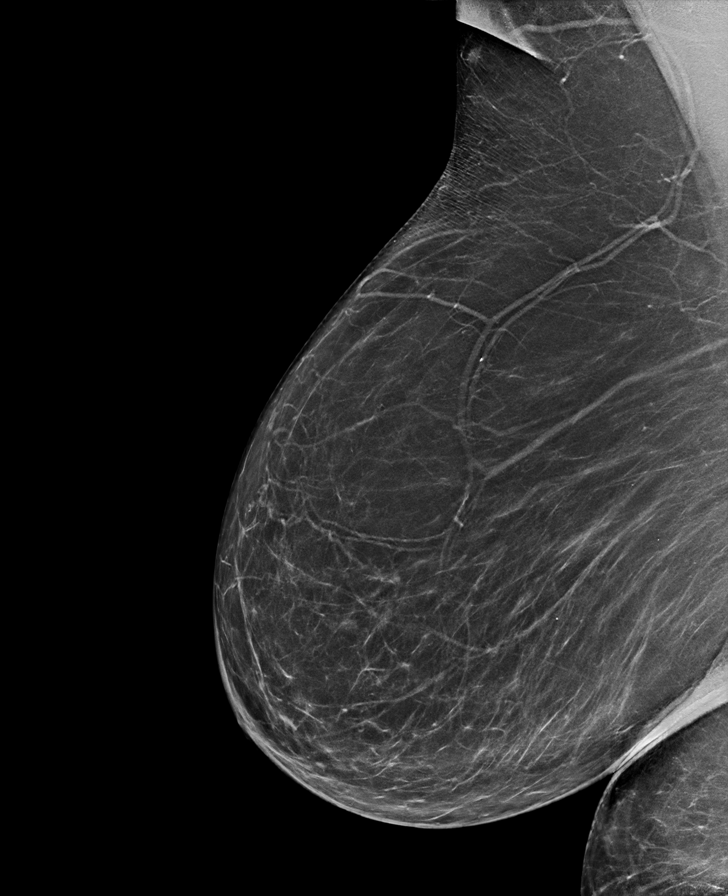

[R CC synth-2D]
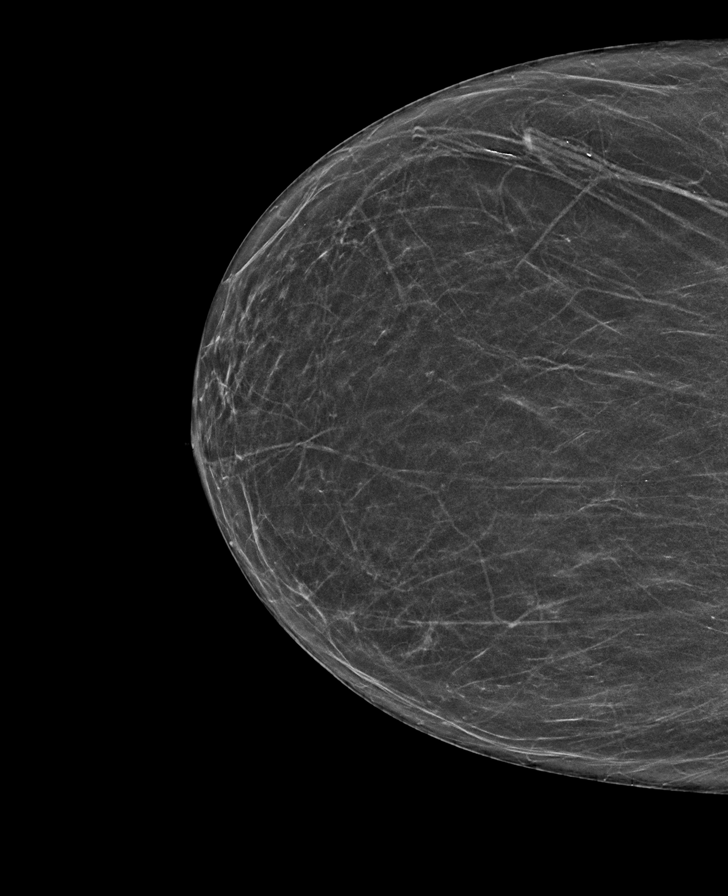

[R MLO synth-2D (2 of 2)]
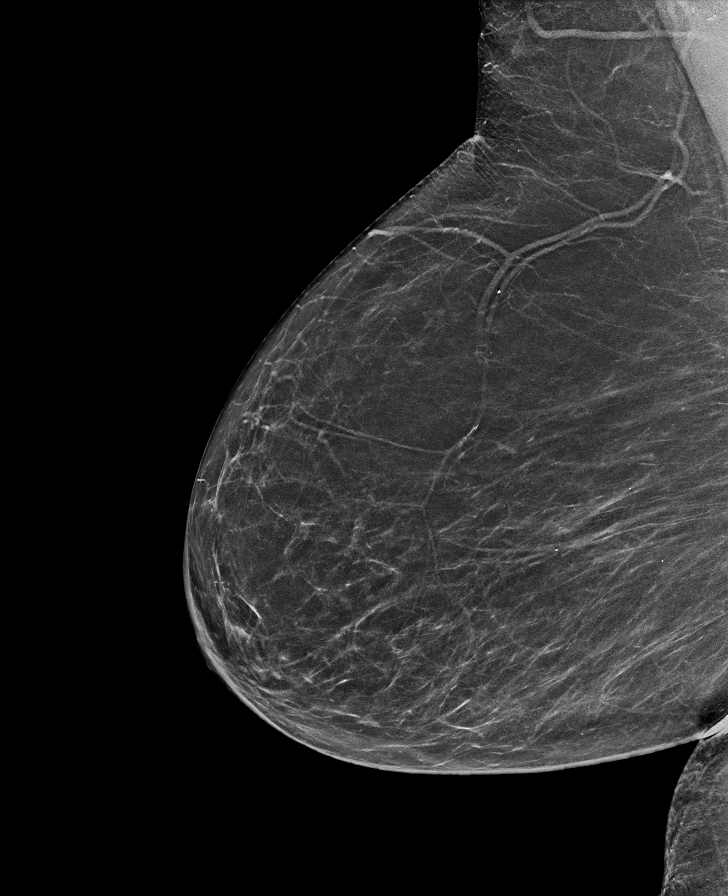

[L MLO synth-2D (1 of 2)]
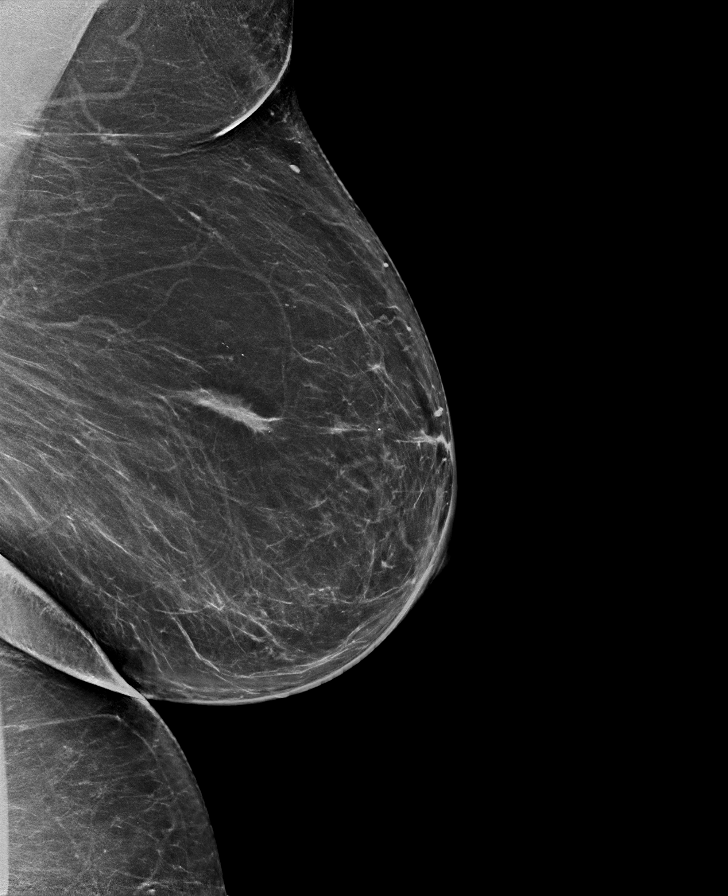

[L MLO synth-2D (2 of 2)]
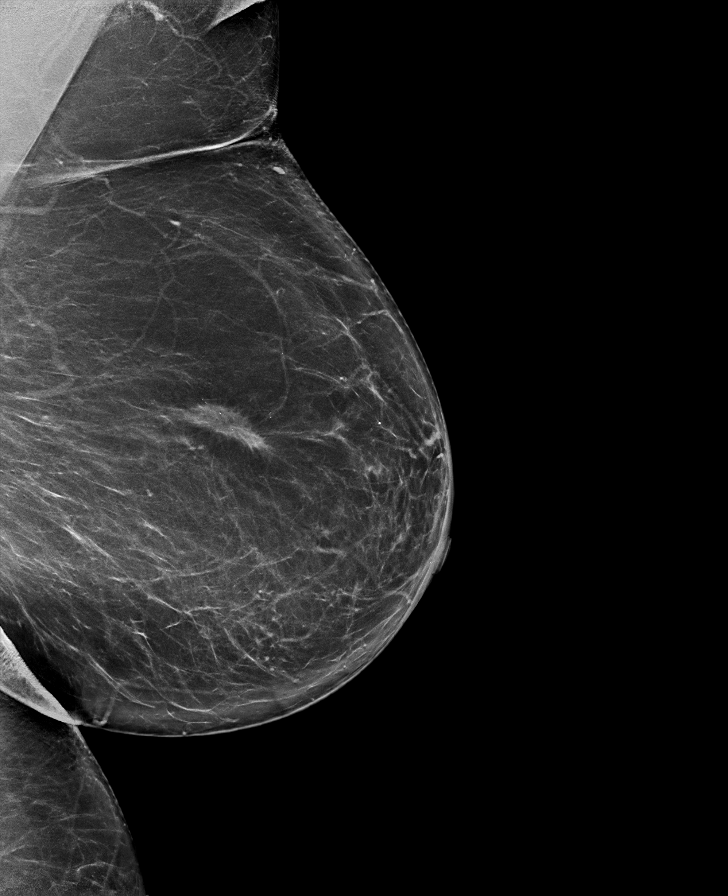

[L MLO tomo · tomo slice 41/82.0]
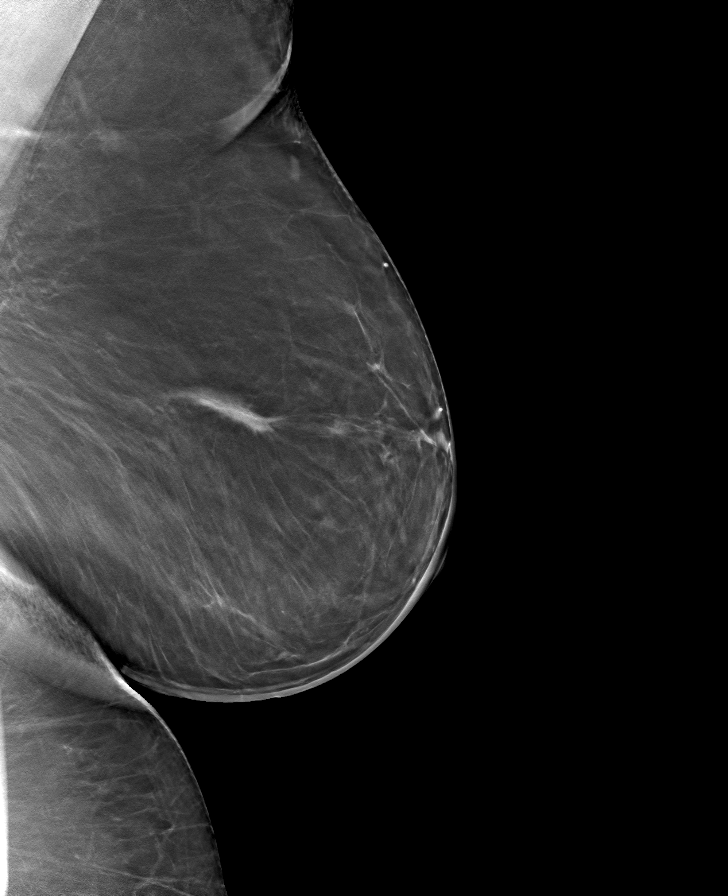

[8 of 40 positions shown; findings below may reference images not displayed]

FINDINGS: There are no findings suspicious for malignancy. Images were
processed with CAD.
IMPRESSION: No mammographic evidence of malignancy. A result letter of this
screening mammogram will be mailed directly to the patient.

RECOMMENDATION:
Screening mammogram in one year. (Code:[TA])

BI-RADS CATEGORY  1: Negative.

## 2019-12-06 ENCOUNTER — Encounter: Payer: Self-pay | Admitting: Internal Medicine

## 2019-12-06 ENCOUNTER — Other Ambulatory Visit: Payer: Self-pay

## 2019-12-06 DIAGNOSIS — C7B02 Secondary carcinoid tumors of liver: Secondary | ICD-10-CM

## 2019-12-06 NOTE — Progress Notes (Signed)
Patient called/ screened for oncology follow-up appointment, daughter assisted with converstaion expresses  concerns of abdominal pain radiating to back. Mentions patient is requesting urinalysis.

## 2019-12-07 ENCOUNTER — Inpatient Hospital Stay (HOSPITAL_BASED_OUTPATIENT_CLINIC_OR_DEPARTMENT_OTHER): Payer: Medicare Other | Admitting: Internal Medicine

## 2019-12-07 ENCOUNTER — Inpatient Hospital Stay: Payer: Medicare Other | Admitting: Hospice and Palliative Medicine

## 2019-12-07 ENCOUNTER — Inpatient Hospital Stay: Payer: Medicare Other

## 2019-12-07 ENCOUNTER — Inpatient Hospital Stay: Payer: Medicare Other | Attending: Internal Medicine

## 2019-12-07 ENCOUNTER — Encounter: Payer: Self-pay | Admitting: Internal Medicine

## 2019-12-07 DIAGNOSIS — I509 Heart failure, unspecified: Secondary | ICD-10-CM | POA: Insufficient documentation

## 2019-12-07 DIAGNOSIS — C7B02 Secondary carcinoid tumors of liver: Secondary | ICD-10-CM

## 2019-12-07 DIAGNOSIS — Z86718 Personal history of other venous thrombosis and embolism: Secondary | ICD-10-CM | POA: Insufficient documentation

## 2019-12-07 DIAGNOSIS — I130 Hypertensive heart and chronic kidney disease with heart failure and stage 1 through stage 4 chronic kidney disease, or unspecified chronic kidney disease: Secondary | ICD-10-CM | POA: Insufficient documentation

## 2019-12-07 DIAGNOSIS — E039 Hypothyroidism, unspecified: Secondary | ICD-10-CM | POA: Insufficient documentation

## 2019-12-07 DIAGNOSIS — C7A019 Malignant carcinoid tumor of the small intestine, unspecified portion: Secondary | ICD-10-CM | POA: Diagnosis not present

## 2019-12-07 DIAGNOSIS — E340 Carcinoid syndrome: Secondary | ICD-10-CM | POA: Diagnosis not present

## 2019-12-07 DIAGNOSIS — D509 Iron deficiency anemia, unspecified: Secondary | ICD-10-CM | POA: Insufficient documentation

## 2019-12-07 DIAGNOSIS — K6389 Other specified diseases of intestine: Secondary | ICD-10-CM

## 2019-12-07 LAB — CBC WITH DIFFERENTIAL/PLATELET
Abs Immature Granulocytes: 0.02 K/uL (ref 0.00–0.07)
Basophils Absolute: 0 K/uL (ref 0.0–0.1)
Basophils Relative: 1 %
Eosinophils Absolute: 0.3 K/uL (ref 0.0–0.5)
Eosinophils Relative: 5 %
HCT: 37.7 % (ref 36.0–46.0)
Hemoglobin: 12.8 g/dL (ref 12.0–15.0)
Immature Granulocytes: 0 %
Lymphocytes Relative: 11 %
Lymphs Abs: 0.8 K/uL (ref 0.7–4.0)
MCH: 30.3 pg (ref 26.0–34.0)
MCHC: 34 g/dL (ref 30.0–36.0)
MCV: 89.1 fL (ref 80.0–100.0)
Monocytes Absolute: 0.8 K/uL (ref 0.1–1.0)
Monocytes Relative: 12 %
Neutro Abs: 4.7 K/uL (ref 1.7–7.7)
Neutrophils Relative %: 71 %
Platelets: 184 K/uL (ref 150–400)
RBC: 4.23 MIL/uL (ref 3.87–5.11)
RDW: 14.4 % (ref 11.5–15.5)
WBC: 6.6 K/uL (ref 4.0–10.5)
nRBC: 0 % (ref 0.0–0.2)

## 2019-12-07 LAB — COMPREHENSIVE METABOLIC PANEL
ALT: 21 U/L (ref 0–44)
AST: 27 U/L (ref 15–41)
Albumin: 3.7 g/dL (ref 3.5–5.0)
Alkaline Phosphatase: 66 U/L (ref 38–126)
Anion gap: 12 (ref 5–15)
BUN: 15 mg/dL (ref 8–23)
CO2: 24 mmol/L (ref 22–32)
Calcium: 9.2 mg/dL (ref 8.9–10.3)
Chloride: 102 mmol/L (ref 98–111)
Creatinine, Ser: 0.88 mg/dL (ref 0.44–1.00)
GFR, Estimated: >60 mL/min (ref >60)
Glucose, Bld: 127 mg/dL — ABNORMAL HIGH (ref 70–99)
Potassium: 3.3 mmol/L — ABNORMAL LOW (ref 3.5–5.1)
Sodium: 138 mmol/L (ref 135–145)
Total Bilirubin: 0.9 mg/dL (ref 0.3–1.2)
Total Protein: 6.6 g/dL (ref 6.5–8.1)

## 2019-12-07 MED ORDER — OCTREOTIDE ACETATE 30 MG IM KIT
30.0000 mg | PACK | Freq: Once | INTRAMUSCULAR | Status: AC
  Administered 2019-12-07: 30 mg via INTRAMUSCULAR
  Filled 2019-12-07: qty 1

## 2019-12-07 NOTE — Assessment & Plan Note (Addendum)
#   Non- functional Small bowel/mesenteric carcinoid; STAGE IV-on octreotide injections monthly; July 2021-gallium PET scan [compared to January -Overall stable/no evidence of obvious progression; however slight increase in size of the mesenteric mass/liver lesion by few millimeters; also slight increase in SUV uptake. STABLE.   #Continue Sandostatin on a monthly basis.  Patient reluctant/declines Lutathera therapy.   #Chronic intermittent abdominal pain question related to mesenteric mass-patient still directed with Lutathera therapy.  Discussed the PFS benefit is median of 3 years.  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  Emma [name] [id number]- Iron sat-9%];HOLD IV Venofer.  Hemoglobin 13.   # chronic mild diarrhea- ? Sec to carcinoid [not on xarmelo secondary insurance issues-chronic STABLE.   # DISPOSITION- # Sandostatin today; NO venofer  # 1 month- sandostatin  # Follow up in 2  month; MD cbc/cmp;iron studies/ferritin-Sandstatin; possible venofer;  Dr.B

## 2019-12-07 NOTE — Progress Notes (Signed)
[facility] PROGRESS NOTE  [name], [name] [name], [name] [name], [name] [name], [name]  Cancer Staging No matching staging information was found for the patient.   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   # FEB 2018- Ga PET- uptake in liver/ mesenteric mass; NOV 2019- increase sandostatin to 3m q monthly.   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  Emma [id number].  female pleasant patient above  history of metastatic carcinoid tumor currently on Sandostatin/iron deficient anemia question AV malformation is here for follow-up.  Patient continues to have intermittent chronic abdominal pain.  Currently resolved.  Chronic mild diarrhea denies any flushing.  Denies any worsening swelling in the legs.   Review of Systems  Constitutional: Positive for malaise/fatigue. Negative for chills, diaphoresis, fever and weight loss.  HENT: Negative for nosebleeds and sore throat.   Eyes: Negative for double vision.  Respiratory: Negative for hemoptysis, sputum production, shortness of breath and wheezing.   Cardiovascular: Positive for leg swelling. Negative for chest pain, palpitations and orthopnea.  Gastrointestinal: Positive for abdominal pain and diarrhea. Negative for constipation, heartburn, melena, nausea and vomiting.  Genitourinary: Negative for dysuria, frequency and urgency.  Musculoskeletal: Negative for back pain and joint pain.  Skin: Negative.  Negative for itching and rash.  Neurological: Negative for dizziness, tingling, focal weakness, weakness and headaches.  Endo/Heme/Allergies: Does not bruise/bleed easily.  Psychiatric/Behavioral: Negative for depression. The patient is not nervous/anxious and does not have insomnia.       PAST MEDICAL HISTORY :  Past Medical History:  Diagnosis Date  . Aromatase inhibitor use   . Breast cancer ([facility] 2014   left breast cancer/radiation  . [place], [place] breast [place] 06/19/2014  . CHF (congestive heart failure) (HCC)    recent sepsis  . Chronic kidney disease   . Dizziness   . Dyspnea    recently while admitted  . GERD (gastroesophageal reflux disease)   . Hearing loss   . History of hiatal hernia   . History of kidney stones   . HOH (hard of hearing)  severe  . Hypertension   . Hypothyroidism   . Leg DVT (deep venous thromboembolism), acute ([place]) 07/2015   left leg after fracture  . 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 Pain     chest pain while admitted  . Personal history of radiation therapy 2015   left breast ca  . Thyroid disease     PAST SURGICAL HISTORY :   Past Surgical History:  Procedure Laterality Date  . ABDOMINAL HYSTERECTOMY     partial  . BREAST LUMPECTOMY Left 01/13/2013   invasive mammary carcinoma, clear margins, LN negative  . BREAST LUMPECTOMY WITH SENTINEL LYMPH NODE BIOPSY Left 2014  . CHOLECYSTECTOMY    . COLONOSCOPY WITH PROPOFOL N/A 09/17/2017   Procedure: COLONOSCOPY WITH PROPOFOL;  Surgeon: Jonathon Bellows, MD;  [facility]COPY;  Service: Gastroenterology;  Laterality: N/A;  . CYSTOSCOPY W/ URETERAL STENT PLACEMENT Right 01/07/2016   Procedure: CYSTOSCOPY WITH STENT REPLACEMENT;  Surgeon: Hollice Espy, MD;  Location: [facility] ORS;  Service: Urology;  Laterality: Right;  . CYSTOSCOPY WITH RETROGRADE PYELOGRAM, URETEROSCOPY AND STENT PLACEMENT Right 12/19/2015   Procedure: CYSTOSCOPY WITH RETROGRADE PYELOGRAM, URETEROSCOPY AND STENT PLACEMENT;  Surgeon: Ardis Hughs, MD;  Location: [facility] ORS;  Service: Urology;  Laterality: Right;  . URETEROSCOPY WITH HOLMIUM LASER LITHOTRIPSY Right 01/07/2016   Procedure: URETEROSCOPY WITH HOLMIUM LASER LITHOTRIPSY;  Surgeon: Hollice Espy, MD;  Location: [facility] ORS;  Service: Urology;  Laterality: Right;    FAMILY HISTORY :   Family History  Problem Relation Age of Onset  . Breast cancer Mother 67       deceased 52  . Breast cancer Maternal Aunt 68       deceased 62s  . Prostate cancer Father        deceased 53  . Colon cancer Paternal Aunt 80       deceased 72s    SOCIAL HISTORY:   Social History   Tobacco Use  . Smoking status: Never Smoker  . Smokeless tobacco: Never Used  Substance Use Topics  . Alcohol use: No    Alcohol/week: 0.0 standard drinks  . Drug use: No    ALLERGIES:  is allergic to sulfa antibiotics.  MEDICATIONS:  Current Outpatient Medications  Medication Sig Dispense Refill  . Calcium  Carb-Cholecalciferol (CALCIUM 600 + D PO) Take 1 tablet by mouth daily.    . furosemide (LASIX) 40 MG tablet Take 40 mg by mouth daily as needed for fluid or edema.     Marland Kitchen labetalol (NORMODYNE) 200 MG tablet Take 200 mg by mouth 2 (two) times daily.     Marland Kitchen levothyroxine (SYNTHROID) 88 MCG tablet Take 88 mcg by mouth daily before breakfast.     . losartan-hydrochlorothiazide (HYZAAR) 100-25 MG tablet Take 1 tablet by mouth daily.    Marland Kitchen octreotide (SANDOSTATIN LAR) 30 MG injection Inject 30 mg into the muscle every 28 (twenty-eight) days.     . potassium chloride SA (KLOR-CON M20) 20 MEQ tablet TAKE 1 TABLET BY MOUTH EVERY DAY 90 tablet 2  . Vitamin D, Ergocalciferol, (DRISDOL) 1.25 MG (50000 UNIT) CAPS capsule Take 1 capsule (50,000 Units total) by mouth once a week. 12 capsule 3  . hyoscyamine (ANASPAZ) 0.125 MG TBDP disintergrating tablet Place 0.25 mg under the tongue every 4 (four) hours as needed for cramping. (Patient not taking: Reported on 12/06/2019)    . nystatin (MYCOSTATIN/NYSTOP) powder APPLY TO AFFECTED AREA TWICE A DAY (Patient not taking: Reported on 12/06/2019) 15 g 3   No  current facility-administered medications for this visit.   Facility-Administered Medications Ordered in Other Visits  Medication Dose Route Frequency Provider Last Rate Last Admin  . octreotide (SANDOSTATIN LAR) IM injection 30 mg  30 mg Intramuscular Once [name], MD        PHYSICAL EXAMINATION: ECOG PERFORMANCE STATUS: 1 - Symptomatic but completely ambulatory  BP (!) 154/98 (BP Location: Right Arm, Patient Position: Sitting, Cuff Size: Large)   Pulse (!) 58   Temp 97.6 F (36.4 C) (Tympanic)   Resp 16   Ht 5' 3"  (1.6 m)   Wt 215 lb (97.5 kg)   SpO2 96%   BMI 38.09 kg/m   Filed Weights   12/07/19 1302  Weight: 215 lb (97.5 kg)    Physical Exam Constitutional:      Comments: Patient is a wheelchair.  She is alone.  Patient is hard of hearing.  HENT:     Head: Normocephalic and  atraumatic.     Mouth/Throat:     Pharynx: No oropharyngeal exudate.  Eyes:     Pupils: Pupils are equal, round, and reactive to light.  Cardiovascular:     Rate and Rhythm: Normal rate and regular rhythm.  Pulmonary:     Effort: Pulmonary effort is normal. No respiratory distress.     Breath sounds: Normal breath sounds. No wheezing.  Abdominal:     General: Bowel sounds are normal. There is no distension.     Palpations: Abdomen is soft. There is no mass.     Tenderness: There is no abdominal tenderness. There is no guarding or rebound.  Musculoskeletal:        General: Swelling present. No tenderness. Normal range of motion.     Cervical back: Normal range of motion and neck supple.  Skin:    General: Skin is warm.  Neurological:     Mental Status: She is alert and oriented to person, place, and time.  Psychiatric:        Mood and Affect: Affect normal.     LABORATORY DATA:  I have reviewed the data as listed    Component Value Date/Time   NA 138 12/07/2019 1248   NA 137 07/12/2013 0841   K 3.3 (L) 12/07/2019 1248   K 4.7 07/12/2013 0841   CL 102 12/07/2019 1248   CL 107 07/12/2013 0841   CO2 24 12/07/2019 1248   CO2 20 (L) 07/12/2013 0841   GLUCOSE 127 (H) 12/07/2019 1248   GLUCOSE 122 (H) 07/12/2013 0841   BUN 15 12/07/2019 1248   BUN 18 07/12/2013 0841   CREATININE 0.88 12/07/2019 1248   CREATININE 0.64 05/22/2014 1111   CALCIUM 9.2 12/07/2019 1248   CALCIUM 9.2 07/12/2013 0841   PROT 6.6 12/07/2019 1248   PROT 7.0 05/22/2014 1111   ALBUMIN 3.7 12/07/2019 1248   ALBUMIN 4.2 05/22/2014 1111   AST 27 12/07/2019 1248   AST 29 05/22/2014 1111   ALT 21 12/07/2019 1248   ALT 17 05/22/2014 1111   ALKPHOS 66 12/07/2019 1248   ALKPHOS 95 05/22/2014 1111   BILITOT 0.9 12/07/2019 1248   BILITOT 0.8 05/22/2014 1111   GFRNONAA >60 12/07/2019 1248   GFRNONAA >60 05/22/2014 1111   GFRAA >60 10/07/2019 1319   GFRAA >60 05/22/2014 1111    No results found for:  SPEP, UPEP  Lab Results  Component Value Date   WBC 6.6 12/07/2019   NEUTROABS 4.7 12/07/2019   HGB 12.8 12/07/2019   HCT 37.7 12/07/2019  MCV 89.1 12/07/2019   PLT 184 12/07/2019      Chemistry      Component Value Date/Time   NA 138 12/07/2019 1248   NA 137 07/12/2013 0841   K 3.3 (L) 12/07/2019 1248   K 4.7 07/12/2013 0841   CL 102 12/07/2019 1248   CL 107 07/12/2013 0841   CO2 24 12/07/2019 1248   CO2 20 (L) 07/12/2013 0841   BUN 15 12/07/2019 1248   BUN 18 07/12/2013 0841   CREATININE 0.88 12/07/2019 1248   CREATININE 0.64 05/22/2014 1111      Component Value Date/Time   CALCIUM 9.2 12/07/2019 1248   CALCIUM 9.2 07/12/2013 0841   ALKPHOS 66 12/07/2019 1248   ALKPHOS 95 05/22/2014 1111   AST 27 12/07/2019 1248   AST 29 05/22/2014 1111   ALT 21 12/07/2019 1248   ALT 17 05/22/2014 1111   BILITOT 0.9 12/07/2019 1248   BILITOT 0.8 05/22/2014 1111       RADIOGRAPHIC STUDIES: I have personally reviewed the radiological images as listed and agreed with the findings in the report. No results found.   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 STABLE.   #Continue Sandostatin on a monthly basis.  Patient reluctant/declines Lutathera therapy.   #Chronic intermittent abdominal pain question related to mesenteric mass-patient still directed with Lutathera therapy.  Discussed the PFS benefit is median of 3 years.  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  Gennie Alma [id number]- Iron sat-9%];HOLD IV Venofer.  Hemoglobin 13.   # chronic mild diarrhea- ? Sec to carcinoid [not on xarmelo secondary insurance issues-chronic STABLE.    # DISPOSITION- # Sandostatin today; NO venofer  # 1 month- sandostatin  # Follow up in 2  month; MD cbc/cmp;iron studies/ferritin-Sandstatin; possible venofer;  Dr.B     No orders of the defined types were placed in this encounter.  All questions were answered. The patient knows to call the clinic with any problems, questions or concerns.      [name], MD 12/07/2019 1:37 PM

## 2019-12-12 ENCOUNTER — Telehealth: Payer: Self-pay | Admitting: *Deleted

## 2019-12-12 NOTE — Telephone Encounter (Signed)
Patient has agreed to treatment in [place], but not until after the holidays in January. Please return call to Tna at [phone]

## 2019-12-12 NOTE — Telephone Encounter (Signed)
Dr. Jacinto Reap- patient now agreeable to Luthera injections.   Altha Harm, this would need to be PA prior to setting up these apts.

## 2019-12-13 ENCOUNTER — Telehealth: Payer: Self-pay | Admitting: Internal Medicine

## 2019-12-13 NOTE — Telephone Encounter (Signed)
LVM for daughter, [name] that pt's consent for lutathera was noted.pt interetsed to start treatment in jan 2022.  Will inform Dr.Stewart.   GB

## 2019-12-13 NOTE — Telephone Encounter (Signed)
Dr. Rogue Bussing will inform Dr. Nicole Kindred.  Per Margreta Journey- ok from insurance standpoint.

## 2020-01-04 ENCOUNTER — Inpatient Hospital Stay: Payer: Medicare Other | Attending: Internal Medicine

## 2020-01-04 DIAGNOSIS — K6389 Other specified diseases of intestine: Secondary | ICD-10-CM

## 2020-01-04 DIAGNOSIS — C7A019 Malignant carcinoid tumor of the small intestine, unspecified portion: Secondary | ICD-10-CM | POA: Diagnosis present

## 2020-01-04 DIAGNOSIS — C7B02 Secondary carcinoid tumors of liver: Secondary | ICD-10-CM | POA: Insufficient documentation

## 2020-01-04 DIAGNOSIS — E340 Carcinoid syndrome: Secondary | ICD-10-CM | POA: Insufficient documentation

## 2020-01-04 MED ORDER — OCTREOTIDE ACETATE 30 MG IM KIT
30.0000 mg | PACK | Freq: Once | INTRAMUSCULAR | Status: AC
  Administered 2020-01-04: 30 mg via INTRAMUSCULAR
  Filled 2020-01-04: qty 1

## 2020-01-12 ENCOUNTER — Other Ambulatory Visit: Payer: Self-pay | Admitting: Internal Medicine

## 2020-01-12 DIAGNOSIS — C7A019 Malignant carcinoid tumor of the small intestine, unspecified portion: Secondary | ICD-10-CM

## 2020-01-12 DIAGNOSIS — Z79811 Long term (current) use of aromatase inhibitors: Secondary | ICD-10-CM

## 2020-01-17 ENCOUNTER — Other Ambulatory Visit: Payer: Self-pay | Admitting: *Deleted

## 2020-01-17 DIAGNOSIS — R110 Nausea: Secondary | ICD-10-CM

## 2020-01-17 MED ORDER — TRAMADOL HCL 50 MG PO TABS
ORAL_TABLET | ORAL | 0 refills | Status: AC
Start: 2020-01-17 — End: ?

## 2020-01-17 MED ORDER — ONDANSETRON HCL 8 MG PO TABS: 8.0000 mg | ORAL_TABLET | Freq: Three times a day (TID) | ORAL | 3 refills | Status: DC | PRN

## 2020-01-17 NOTE — Telephone Encounter (Signed)
And nausea medicine too

## 2020-01-17 NOTE — Telephone Encounter (Signed)
The following correspondence has been sent to coordinate Lutathera apts.- sent below message to Dr. Leonia Reeves and [name] to coordinate.   Received incoming message from the patient's daughter. She was inquiring when the apts would be scheduled. She previously wanted this treatment after the holiday. Please contact daughter [name] to set up apts at [phone]   ===View-only below this line=== ----- Message ----- From: Gloris Ham, RN Sent: 12/16/2019   8:16 AM EST To: [name] Subject: RE: [name]- I checked with [name]. 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  She however wants to have it done after the holidays in January.    I would appreciate if your office can reach out to her to make necessary appointments. Thank you, GB

## 2020-01-17 NOTE — Telephone Encounter (Signed)
Dr. Jacinto Reap - Please advise on pain control with Tramadol

## 2020-01-17 NOTE — Telephone Encounter (Signed)
Contacted daughter [name]. She was made aware that Dr. Jacinto Reap [facility] [place]. I informed her that her mom wanted to wait until after Jan 1 to have the apts for [place] scheduled. I explained to her that I forward a message to the teams in [place], who will contact her to schedule these apts. She gave verbal understanding of the plan of care.

## 2020-01-17 NOTE — Telephone Encounter (Signed)
Daughter Emma [name] called reporting that patient is having bouts of nausea and has no medicine for it, also reports that patient is having abdominal pain at tumor site and is asking of we could order something mild for her like a low dose Tramadol. She also states that they have not heard from [place] regarding the therapy she is to get there [facility])

## 2020-01-17 NOTE — Telephone Encounter (Signed)
zofran 8 mg 1 tablet every 8 hours prn nausea sent to patient's pharmacy.  rx for tramadol 50 mg- half tablet to 1 tablet every 8 hours for pain- pended for MD signature.

## 2020-01-19 ENCOUNTER — Telehealth: Payer: Self-pay | Admitting: Internal Medicine

## 2020-01-19 NOTE — Telephone Encounter (Signed)
On 12/23-I called the patient's daughter [name] to discuss her mother complains of abdominal pain.  Agree with tramadol.  Discussed my concerns for carcinoid/mesenteric mass causing some of her symptoms.  However no evidence of any bowel obstruction.  I asked the daughter [name] to watch her for symptoms of bowel obstruction [intractable nausea vomiting abdominal distention constipation] wherein-she will call office for further instructions/evaluation.   I again discussed the role of Lutathera-median PFS about 3 years; no significant response rates; but stable disease.  Discussed that unfortunately patient has delayed the Lutathera treatments in spite of my recommendations multiple times the past.  She is currently awaiting evaluation for early next year with Dr. Nicole Kindred; [facility] [patient's preference/no appointment yet].   GB

## 2020-02-02 ENCOUNTER — Telehealth: Payer: Self-pay | Admitting: *Deleted

## 2020-02-02 NOTE — Telephone Encounter (Signed)
Per Theodosia Quay in [place] [facility], she will reach out to patient's daughter and attempt to r/s patient for Lutathera treatments. Patient will need to complete "AAA paperwork" for insurance purposes. Lovena Le will fax this to our clinic to be given to patient to complete at her next apt on 1/11.

## 2020-02-03 ENCOUNTER — Other Ambulatory Visit: Payer: Self-pay

## 2020-02-03 DIAGNOSIS — D500 Iron deficiency anemia secondary to blood loss (chronic): Secondary | ICD-10-CM

## 2020-02-03 DIAGNOSIS — C7B02 Secondary carcinoid tumors of liver: Secondary | ICD-10-CM

## 2020-02-06 NOTE — Telephone Encounter (Signed)
Per Lovena Le, she has attempted to reach daughter, but has been unsuccessful at this time.

## 2020-02-07 ENCOUNTER — Encounter: Payer: Self-pay | Admitting: Internal Medicine

## 2020-02-07 ENCOUNTER — Inpatient Hospital Stay (HOSPITAL_BASED_OUTPATIENT_CLINIC_OR_DEPARTMENT_OTHER): Payer: Medicare Other | Admitting: Internal Medicine

## 2020-02-07 ENCOUNTER — Inpatient Hospital Stay: Payer: Medicare Other

## 2020-02-07 ENCOUNTER — Inpatient Hospital Stay: Payer: Medicare Other | Attending: Internal Medicine

## 2020-02-07 DIAGNOSIS — R197 Diarrhea, unspecified: Secondary | ICD-10-CM | POA: Diagnosis not present

## 2020-02-07 DIAGNOSIS — D509 Iron deficiency anemia, unspecified: Secondary | ICD-10-CM | POA: Diagnosis not present

## 2020-02-07 DIAGNOSIS — I130 Hypertensive heart and chronic kidney disease with heart failure and stage 1 through stage 4 chronic kidney disease, or unspecified chronic kidney disease: Secondary | ICD-10-CM | POA: Diagnosis not present

## 2020-02-07 DIAGNOSIS — C7B02 Secondary carcinoid tumors of liver: Secondary | ICD-10-CM | POA: Diagnosis not present

## 2020-02-07 DIAGNOSIS — I509 Heart failure, unspecified: Secondary | ICD-10-CM | POA: Insufficient documentation

## 2020-02-07 DIAGNOSIS — C7A019 Malignant carcinoid tumor of the small intestine, unspecified portion: Secondary | ICD-10-CM

## 2020-02-07 DIAGNOSIS — E039 Hypothyroidism, unspecified: Secondary | ICD-10-CM | POA: Insufficient documentation

## 2020-02-07 DIAGNOSIS — E340 Carcinoid syndrome: Secondary | ICD-10-CM | POA: Insufficient documentation

## 2020-02-07 DIAGNOSIS — D500 Iron deficiency anemia secondary to blood loss (chronic): Secondary | ICD-10-CM

## 2020-02-07 DIAGNOSIS — N189 Chronic kidney disease, unspecified: Secondary | ICD-10-CM | POA: Insufficient documentation

## 2020-02-07 DIAGNOSIS — K6389 Other specified diseases of intestine: Secondary | ICD-10-CM

## 2020-02-07 LAB — COMPREHENSIVE METABOLIC PANEL
ALT: 18 U/L (ref 0–44)
AST: 27 U/L (ref 15–41)
Albumin: 3.8 g/dL (ref 3.5–5.0)
Alkaline Phosphatase: 80 U/L (ref 38–126)
Anion gap: 9 (ref 5–15)
BUN: 14 mg/dL (ref 8–23)
CO2: 26 mmol/L (ref 22–32)
Calcium: 9.3 mg/dL (ref 8.9–10.3)
Chloride: 101 mmol/L (ref 98–111)
Creatinine, Ser: 0.73 mg/dL (ref 0.44–1.00)
GFR, Estimated: >60 mL/min (ref >60)
Glucose, Bld: 147 mg/dL — ABNORMAL HIGH (ref 70–99)
Potassium: 3.4 mmol/L — ABNORMAL LOW (ref 3.5–5.1)
Sodium: 136 mmol/L (ref 135–145)
Total Bilirubin: 0.7 mg/dL (ref 0.3–1.2)
Total Protein: 6.9 g/dL (ref 6.5–8.1)

## 2020-02-07 LAB — CBC WITH DIFFERENTIAL/PLATELET
Abs Immature Granulocytes: 0.02 10*3/uL (ref 0.00–0.07)
Basophils Absolute: 0 10*3/uL (ref 0.0–0.1)
Basophils Relative: 1 %
Eosinophils Absolute: 0.4 10*3/uL (ref 0.0–0.5)
Eosinophils Relative: 6 %
HCT: 39.2 % (ref 36.0–46.0)
Hemoglobin: 13.3 g/dL (ref 12.0–15.0)
Immature Granulocytes: 0 %
Lymphocytes Relative: 11 %
Lymphs Abs: 0.6 10*3/uL — ABNORMAL LOW (ref 0.7–4.0)
MCH: 30.7 pg (ref 26.0–34.0)
MCHC: 33.9 g/dL (ref 30.0–36.0)
MCV: 90.5 fL (ref 80.0–100.0)
Monocytes Absolute: 0.7 10*3/uL (ref 0.1–1.0)
Monocytes Relative: 12 %
Neutro Abs: 4.2 10*3/uL (ref 1.7–7.7)
Neutrophils Relative %: 70 %
Platelets: 189 10*3/uL (ref 150–400)
RBC: 4.33 MIL/uL (ref 3.87–5.11)
RDW: 14 % (ref 11.5–15.5)
WBC: 6 10*3/uL (ref 4.0–10.5)
nRBC: 0 % (ref 0.0–0.2)

## 2020-02-07 LAB — IRON AND TIBC
Iron: 71 ug/dL (ref 28–170)
Saturation Ratios: 17 % (ref 10.4–31.8)
TIBC: 413 ug/dL (ref 250–450)
UIBC: 355 ug/dL

## 2020-02-07 LAB — FERRITIN: Ferritin: 19 ng/mL (ref 11–307)

## 2020-02-07 MED ORDER — OCTREOTIDE ACETATE 30 MG IM KIT
30.0000 mg | PACK | Freq: Once | INTRAMUSCULAR | Status: AC
  Administered 2020-02-07: 30 mg via INTRAMUSCULAR
  Filled 2020-02-07: qty 1

## 2020-02-07 NOTE — Assessment & Plan Note (Addendum)
#   Non- functional Small bowel/mesenteric carcinoid; STAGE IV-on octreotide injections monthly; July 2021-gallium PET scan [compared to January -Overall stable/no evidence of obvious progression; however slight increase in size of the mesenteric mass/liver lesion by few millimeters; also slight increase in SUV uptake.  STABLE.    #Continue Sandostatin on a monthly basis; pt reluctantly agrees with Lutathera treatment.  Contacted Dr. Nicole Kindred, [place].  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  Gennie Alma [id number]- Iron sat-9%];HOLD IV Venofer.  Hemoglobin 13.3.    # chronic mild diarrhea- ? Sec to carcinoid [not on xarmelo secondary insurance issues-chronic STABLE.   # DISPOSITION- # Sandostatin today; NO venofer # Follow up in 1  month; MD cbc/cmp -Sandstatin; possible venofer;  Dr.B

## 2020-02-07 NOTE — Progress Notes (Signed)
[place] OFFICE PROGRESS NOTE  [name], [name] [name], [name] [name], [name] [name], [name]  Cancer Staging No matching staging information was found for the patient.   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   # FEB 2018- Ga PET- uptake in liver/ mesenteric mass; NOV 2019- increase sandostatin to 62m q monthly.   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  [name]89y.o.  female pleasant patient above  history of metastatic carcinoid tumor currently on Sandostatin/iron deficient anemia question AV malformation is here for follow-up.  Chronic Abdominal pain intermittent. Diarrhea- up to 5 stools a day. [not on xarmelo].  No worsening shortness of breath or swelling in legs.  No nausea or vomiting.  Review of Systems  Constitutional: Positive for malaise/fatigue. Negative for chills, diaphoresis, fever and weight loss.  HENT: Negative for nosebleeds and sore throat.   Eyes: Negative for double vision.  Respiratory: Negative for hemoptysis, sputum production, shortness of breath and wheezing.   Cardiovascular: Negative for chest pain, palpitations and orthopnea.  Gastrointestinal: Positive for abdominal pain and diarrhea. Negative for constipation, heartburn, melena, nausea and vomiting.  Genitourinary: Negative for dysuria, frequency and urgency.  Musculoskeletal: Negative for back pain and joint pain.  Skin: Negative.  Negative for itching and rash.  Neurological: Negative for dizziness, tingling, focal weakness, weakness and headaches.  Endo/Heme/Allergies: Does not bruise/bleed easily.  Psychiatric/Behavioral: Negative for depression. The patient is not nervous/anxious and does not have insomnia.       PAST MEDICAL HISTORY :  Past Medical History:  Diagnosis Date  . Aromatase inhibitor use   . Breast cancer ([facility] 2014   left breast cancer/radiation  . Breast cancer, left breast ([facility] 06/19/2014  . CHF (congestive heart failure) (HCC)    recent sepsis  . Chronic kidney disease   . Dizziness   . Dyspnea    recently while admitted  . GERD (gastroesophageal reflux disease)   . Hearing loss   . History of hiatal hernia   . History of kidney stones   . HOH (hard of hearing)  severe  . Hypertension   . Hypothyroidism   . Leg DVT (deep venous thromboembolism), acute ([place]) 07/2015   left leg after fracture  . 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 Pain    chest pain while admitted  .  Personal history of radiation therapy 2015   left breast ca  . Thyroid disease     PAST SURGICAL HISTORY :   Past Surgical History:  Procedure Laterality Date  . ABDOMINAL HYSTERECTOMY     partial  . BREAST LUMPECTOMY Left 01/13/2013   invasive mammary carcinoma, clear margins, LN negative  . BREAST LUMPECTOMY WITH SENTINEL LYMPH NODE BIOPSY Left 2014  . CHOLECYSTECTOMY    . COLONOSCOPY WITH PROPOFOL N/A 09/17/2017   Procedure: COLONOSCOPY WITH PROPOFOL;  Surgeon: Jonathon Bellows, MD;  Location: [facility] ENDOSCOPY;  Service: Gastroenterology;  Laterality: N/A;  . CYSTOSCOPY W/ URETERAL STENT PLACEMENT Right 01/07/2016   Procedure: CYSTOSCOPY WITH STENT REPLACEMENT;  Surgeon: Hollice Espy, MD;  Location: [facility] ORS;  Service: Urology;  Laterality: Right;  . CYSTOSCOPY WITH RETROGRADE PYELOGRAM, URETEROSCOPY AND STENT PLACEMENT Right 12/19/2015   Procedure: CYSTOSCOPY WITH RETROGRADE PYELOGRAM, URETEROSCOPY AND STENT PLACEMENT;  Surgeon: Ardis Hughs, MD;  Location: [facility] ORS;  Service: Urology;  Laterality: Right;  . URETEROSCOPY WITH HOLMIUM LASER LITHOTRIPSY Right 01/07/2016   Procedure: URETEROSCOPY WITH HOLMIUM LASER LITHOTRIPSY;  Surgeon: Hollice Espy, MD;  Location: [facility] ORS;  Service: Urology;  Laterality: Right;    FAMILY HISTORY :   Family History  Problem Relation Age of Onset  . Breast cancer Mother 59       deceased 54  . Breast cancer Maternal Aunt 68       deceased 69s  . Prostate cancer Father        deceased 58  . Colon cancer Paternal Aunt 80       deceased 2s    SOCIAL HISTORY:   Social History   Tobacco Use  . Smoking status: Never Smoker  . Smokeless tobacco: Never Used  Substance Use Topics  . Alcohol use: No    Alcohol/week: 0.0 standard drinks  . Drug use: No    ALLERGIES:  is allergic to sulfa antibiotics.  MEDICATIONS:  Current Outpatient Medications  Medication Sig Dispense Refill  . Calcium Carb-Cholecalciferol (CALCIUM 600 + D PO) Take 1  tablet by mouth daily.    . furosemide (LASIX) 40 MG tablet Take 40 mg by mouth daily as needed for fluid or edema.     Emma [name] labetalol (NORMODYNE) 200 MG tablet Take 200 mg by mouth 2 (two) times daily.     Emma [name] levothyroxine (SYNTHROID) 88 MCG tablet Take 88 mcg by mouth daily before breakfast.     . losartan-hydrochlorothiazide (HYZAAR) 100-25 MG tablet Take 1 tablet by mouth daily.    Emma [name] octreotide (SANDOSTATIN LAR) 30 MG injection Inject 30 mg into the muscle every 28 (twenty-eight) days.     . ondansetron (ZOFRAN) 8 MG tablet Take 1 tablet (8 mg total) by mouth every 8 (eight) hours as needed for nausea or vomiting. 30 tablet 3  . potassium chloride SA (KLOR-CON M20) 20 MEQ tablet TAKE 1 TABLET BY MOUTH EVERY DAY 90 tablet 2  . traMADol (ULTRAM) 50 MG tablet 0.5 tablet to 1 tablet every 8 hours as needed for pain 30 tablet 0  . Vitamin D, Ergocalciferol, (DRISDOL) 1.25 MG (50000 UNIT) CAPS capsule TAKE 1 CAPSULE BY MOUTH ONE TIME PER WEEK 12 capsule 3  . hyoscyamine (ANASPAZ) 0.125 MG TBDP  disintergrating tablet Place 0.25 mg under the tongue every 4 (four) hours as needed for cramping. (Patient not taking: No sig reported)    . nystatin (MYCOSTATIN/NYSTOP) powder APPLY TO AFFECTED AREA TWICE A DAY (Patient not taking: No sig reported) 15 g 3   No current facility-administered medications for this visit.   Facility-Administered Medications Ordered in Other Visits  Medication Dose Route Frequency Provider Last Rate Last Admin  . octreotide (SANDOSTATIN LAR) IM injection 30 mg  30 mg Intramuscular Once [name], MD        PHYSICAL EXAMINATION: ECOG PERFORMANCE STATUS: 1 - Symptomatic but completely ambulatory  BP (!) 121/54 (BP Location: Right Arm, Patient Position: Sitting, Cuff Size: Large)   Pulse (!) 51   Temp 97.6 F (36.4 C) (Tympanic)   Resp 16   Ht 5' 3"  (1.6 m)   Wt 220 lb (99.8 kg)   SpO2 96%   BMI 38.97 kg/m   Filed Weights   02/07/20 1306  Weight: 220 lb (99.8  kg)    Physical Exam Constitutional:      Comments: Patient is a wheelchair.  She is alone.  Patient is hard of hearing.  HENT:     Head: Normocephalic and atraumatic.     Mouth/Throat:     Pharynx: No oropharyngeal exudate.  Eyes:     Pupils: Pupils are equal, round, and reactive to light.  Cardiovascular:     Rate and Rhythm: Normal rate and regular rhythm.  Pulmonary:     Effort: Pulmonary effort is normal. No respiratory distress.     Breath sounds: Normal breath sounds. No wheezing.  Abdominal:     General: Bowel sounds are normal. There is no distension.     Palpations: Abdomen is soft. There is no mass.     Tenderness: There is no abdominal tenderness. There is no guarding or rebound.  Musculoskeletal:        General: Swelling present. No tenderness. Normal range of motion.     Cervical back: Normal range of motion and neck supple.  Skin:    General: Skin is warm.  Neurological:     Mental Status: She is alert and oriented to person, place, and time.  Psychiatric:        Mood and Affect: Affect normal.     LABORATORY DATA:  I have reviewed the data as listed    Component Value Date/Time   NA 136 02/07/2020 1247   NA 137 07/12/2013 0841   K 3.4 (L) 02/07/2020 1247   K 4.7 07/12/2013 0841   CL 101 02/07/2020 1247   CL 107 07/12/2013 0841   CO2 26 02/07/2020 1247   CO2 20 (L) 07/12/2013 0841   GLUCOSE 147 (H) 02/07/2020 1247   GLUCOSE 122 (H) 07/12/2013 0841   BUN 14 02/07/2020 1247   BUN 18 07/12/2013 0841   CREATININE 0.73 02/07/2020 1247   CREATININE 0.64 05/22/2014 1111   CALCIUM 9.3 02/07/2020 1247   CALCIUM 9.2 07/12/2013 0841   PROT 6.9 02/07/2020 1247   PROT 7.0 05/22/2014 1111   ALBUMIN 3.8 02/07/2020 1247   ALBUMIN 4.2 05/22/2014 1111   AST 27 02/07/2020 1247   AST 29 05/22/2014 1111   ALT 18 02/07/2020 1247   ALT 17 05/22/2014 1111   ALKPHOS 80 02/07/2020 1247   ALKPHOS 95 05/22/2014 1111   BILITOT 0.7 02/07/2020 1247   BILITOT 0.8  05/22/2014 1111   GFRNONAA >60 02/07/2020 1247   GFRNONAA >60 05/22/2014 1111  GFRAA >60 10/07/2019 1319   GFRAA >60 05/22/2014 1111    No results found for: SPEP, UPEP  Lab Results  Component Value Date   WBC 6.0 02/07/2020   NEUTROABS 4.2 02/07/2020   HGB 13.3 02/07/2020   HCT 39.2 02/07/2020   MCV 90.5 02/07/2020   PLT 189 02/07/2020      Chemistry      Component Value Date/Time   NA 136 02/07/2020 1247   NA 137 07/12/2013 0841   K 3.4 (L) 02/07/2020 1247   K 4.7 07/12/2013 0841   CL 101 02/07/2020 1247   CL 107 07/12/2013 0841   CO2 26 02/07/2020 1247   CO2 20 (L) 07/12/2013 0841   BUN 14 02/07/2020 1247   BUN 18 07/12/2013 0841   CREATININE 0.73 02/07/2020 1247   CREATININE 0.64 05/22/2014 1111      Component Value Date/Time   CALCIUM 9.3 02/07/2020 1247   CALCIUM 9.2 07/12/2013 0841   ALKPHOS 80 02/07/2020 1247   ALKPHOS 95 05/22/2014 1111   AST 27 02/07/2020 1247   AST 29 05/22/2014 1111   ALT 18 02/07/2020 1247   ALT 17 05/22/2014 1111   BILITOT 0.7 02/07/2020 1247   BILITOT 0.8 05/22/2014 1111       RADIOGRAPHIC STUDIES: I have personally reviewed the radiological images as listed and agreed with the findings in the report. No results found.   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  STABLE.    #Continue Sandostatin on a monthly basis; pt reluctantly agrees with Lutathera treatment.  Contacted Dr. Nicole Kindred, [place].  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  Gennie Alma [id number]- Iron sat-9%];HOLD IV Venofer.  Hemoglobin 13.3.    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   # DISPOSITION- # Sandostatin today; NO venofer # Follow up in 1  month; MD cbc/cmp -Sandstatin; possible venofer;  Dr.B     No orders of the defined types were placed in this encounter.  All questions were answered. The patient knows to call the clinic with any problems, questions or concerns.      [name], MD 02/07/2020 2:04 PM

## 2020-02-07 NOTE — Telephone Encounter (Signed)
  Spoke with Daughter- gave family member Taylor's phone in [place] she will call Lovena Le around 7:30pm (. [phone])

## 2020-02-09 ENCOUNTER — Telehealth: Payer: Self-pay | Admitting: *Deleted

## 2020-02-09 NOTE — Telephone Encounter (Signed)
Per message from [place] at [facility]. apts for Luthathera arranged. Patient will need sandostatin on 04/04/2020, 05/30/2020, and 07/25/2020. She will not need the sando Injection scheduled for 03/09/2020. I will r/s patient's apts and coordinate the above injections. Dr. Rogue Bussing is aware of the plan of care.

## 2020-02-13 NOTE — Telephone Encounter (Signed)
Sandostatin apts cnl on 2/11. I left the MD/possible IV iron apt on schedule. Message sent to scheduling to arrange for sandostatin on 04/04/2020, 05/30/2020, and 07/25/2020

## 2020-03-07 ENCOUNTER — Ambulatory Visit (HOSPITAL_COMMUNITY)
Admission: RE | Admit: 2020-03-07 | Discharge: 2020-03-07 | Disposition: A | Discharge status: Home / Self Care | Payer: Medicare Other | Source: Ambulatory Visit | Attending: Internal Medicine | Admitting: Internal Medicine

## 2020-03-07 ENCOUNTER — Other Ambulatory Visit: Payer: Self-pay

## 2020-03-07 DIAGNOSIS — C7B02 Secondary carcinoid tumors of liver: Secondary | ICD-10-CM | POA: Diagnosis present

## 2020-03-07 LAB — COMPREHENSIVE METABOLIC PANEL
ALT: 18 U/L (ref 0–44)
AST: 24 U/L (ref 15–41)
Albumin: 4.1 g/dL (ref 3.5–5.0)
Alkaline Phosphatase: 77 U/L (ref 38–126)
Anion gap: 11 (ref 5–15)
BUN: 14 mg/dL (ref 8–23)
CO2: 25 mmol/L (ref 22–32)
Calcium: 9.6 mg/dL (ref 8.9–10.3)
Chloride: 102 mmol/L (ref 98–111)
Creatinine, Ser: 0.85 mg/dL (ref 0.44–1.00)
GFR, Estimated: >60 mL/min (ref >60)
Glucose, Bld: 131 mg/dL — ABNORMAL HIGH (ref 70–99)
Potassium: 3.2 mmol/L — ABNORMAL LOW (ref 3.5–5.1)
Sodium: 138 mmol/L (ref 135–145)
Total Bilirubin: 1.1 mg/dL (ref 0.3–1.2)
Total Protein: 7.2 g/dL (ref 6.5–8.1)

## 2020-03-07 LAB — CBC WITH DIFFERENTIAL/PLATELET
Abs Immature Granulocytes: 0.03 10*3/uL (ref 0.00–0.07)
Basophils Absolute: 0.1 10*3/uL (ref 0.0–0.1)
Basophils Relative: 1 %
Eosinophils Absolute: 0.4 10*3/uL (ref 0.0–0.5)
Eosinophils Relative: 5 %
HCT: 40.3 % (ref 36.0–46.0)
Hemoglobin: 13.3 g/dL (ref 12.0–15.0)
Immature Granulocytes: 0 %
Lymphocytes Relative: 10 %
Lymphs Abs: 0.7 10*3/uL (ref 0.7–4.0)
MCH: 30.2 pg (ref 26.0–34.0)
MCHC: 33 g/dL (ref 30.0–36.0)
MCV: 91.6 fL (ref 80.0–100.0)
Monocytes Absolute: 0.9 10*3/uL (ref 0.1–1.0)
Monocytes Relative: 13 %
Neutro Abs: 5.3 10*3/uL (ref 1.7–7.7)
Neutrophils Relative %: 71 %
Platelets: 182 10*3/uL (ref 150–400)
RBC: 4.4 MIL/uL (ref 3.87–5.11)
RDW: 13.9 % (ref 11.5–15.5)
WBC: 7.4 10*3/uL (ref 4.0–10.5)
nRBC: 0 % (ref 0.0–0.2)

## 2020-03-07 MED ORDER — OCTREOTIDE ACETATE 30 MG IM KIT
30.0000 mg | PACK | Freq: Once | INTRAMUSCULAR | Status: AC
  Administered 2020-03-07: 30 mg via INTRAMUSCULAR

## 2020-03-07 MED ORDER — LUTETIUM LU 177 DOTATATE 370 MBQ/ML IV SOLN
200.0000 | Freq: Once | INTRAVENOUS | Status: AC
  Administered 2020-03-07: 203.1 via INTRAVENOUS

## 2020-03-07 MED ORDER — SODIUM CHLORIDE 0.9 % IV SOLN: 500.0000 mL | Freq: Once | INTRAVENOUS | Status: DC

## 2020-03-07 MED ORDER — LORAZEPAM 0.5 MG PO TABS
0.5000 mg | ORAL_TABLET | Freq: Once | ORAL | Status: AC
  Administered 2020-03-07: 0.5 mg via ORAL
  Filled 2020-03-07: qty 1

## 2020-03-07 MED ORDER — SODIUM CHLORIDE 0.9 % IV SOLN
8.0000 mg | Freq: Once | INTRAVENOUS | Status: AC
  Administered 2020-03-07: 8 mg via INTRAVENOUS
  Filled 2020-03-07: qty 4

## 2020-03-07 MED ORDER — AMINO ACID RADIOPROTECTANT - L-LYSINE 2.5%/L-ARGININE 2.5% IN NS
250.0000 mL/h | INTRAVENOUS | Status: AC
  Administered 2020-03-07: 250 mL/h via INTRAVENOUS
  Filled 2020-03-07: qty 1000

## 2020-03-07 MED ORDER — ONDANSETRON HCL 8 MG PO TABS: 8.0000 mg | ORAL_TABLET | Freq: Two times a day (BID) | ORAL | 0 refills | Status: DC | PRN

## 2020-03-07 MED ORDER — OCTREOTIDE ACETATE 30 MG IM KIT
PACK | INTRAMUSCULAR | Status: AC
  Filled 2020-03-07: qty 1

## 2020-03-07 MED ORDER — PROCHLORPERAZINE EDISYLATE 10 MG/2ML IJ SOLN: 10.0000 mg | Freq: Four times a day (QID) | INTRAMUSCULAR | Status: DC | PRN

## 2020-03-07 MED ORDER — OCTREOTIDE ACETATE 500 MCG/ML IJ SOLN: 500.0000 ug | Freq: Once | INTRAMUSCULAR | Status: DC | PRN

## 2020-03-07 NOTE — Progress Notes (Signed)
CLINICAL DATA: [58] year-old [female] with metastatic neuroendocrine tumor. Well differentiated tumor with somatostatin receptor is identified within the [liver and small amount mesentery as well as peritoneal nodularity] by DOTATATE PET CT scan.  Patient with carcinoid syndrome including diarrhea.  EXAM: NUCLEAR MEDICINE LUTATHERA ADMINISTRATION TECHNIQUE: Infusion: The nuclear medicine technologist and I personally verified the dose activity ([205] mCi) to be delivered as specified in the written directive (200 mCi), and verified the patient identification via 2 separate methods. 20 gauge IV were started in the antecubital veins. Anti-emetics were administered by nursing staff. Amino acid renal protection was initiated 30 minutes prior to Lu 177 DOTATATE (Lutathera) infusion and continued continuously for 4 hours. Lutathera infusion was administered over 30 minutes.   The total administered dose was [203.1] mCi Lu 177 DOTATATE.  The entire IV tubing, venocatheter, stopcock and syringes was removed in total, placed in a disposal bag and sent for assay of the residual activity, which will be reported at a later time in our EMR by the physics staff. Pressure was applied to the venipuncture sites, and a compression bandage placed. Radiation Safety personnel were present to perform the discharge survey, as detailed on their documentation.  Patient received 30 mg IM long-acting Sandostatin injection 4 hours after Lutathera effusion in the nuclear medicine department.  RADIOPHARMACEUTICALS:  [203.1] mCi Lu 177 DOTATATE  FINDINGS: Diagnosis: [Metastatic neuroendocrine tumor.]  Current Infusion: [1]  Planned Infusions: [4]   As there was a significant delayed between initial consultation and first treatment, risks and benefits of procedure were carefully explained to patient.  Primary benefit prolongation of progression free survival with secondary benefits of symptom reduction.  Most  common adverse effects is leukopenia.  More rare adverse effects including more extensive marrow suppression, renal toxicity, and hepatic toxicity were detailed.  Patient's daughter was present and her questions and concerns were answered addressed.   Patient reports persistent diarrhea and bloating.. The patient's most recent blood counts were reviewed and remains a good candidate to proceed with Lutathera. The patient was situated in an infusion suite and administered Lutathera as above. Patient will follow-up with referring oncologist for interval serum laboratories (CBC and CMP) in approximately 4 weeks.    Patient will receive 30 mg IM long-acting Sandostatin injection 4 hours after Lutathera effusion in the nuclear medicine department.   IMPRESSION: [First] DE 081 [id number] treatment for metastatic neuroendocrine tumor. The patient tolerated the infusion well. The patient will return in 8 weeks for ongoing care.

## 2020-03-08 ENCOUNTER — Other Ambulatory Visit (HOSPITAL_COMMUNITY): Payer: Self-pay | Admitting: Internal Medicine

## 2020-03-08 ENCOUNTER — Telehealth: Payer: Self-pay | Admitting: *Deleted

## 2020-03-08 DIAGNOSIS — C7B02 Secondary carcinoid tumors of liver: Secondary | ICD-10-CM

## 2020-03-08 NOTE — Telephone Encounter (Signed)
Spoke with patient's daughter. Patient tolerated the Lutathera very well yesterday. Patient's labs are stable. Per direction of Dr. Rogue Bussing. We will cnl the venofer tomorrow and md apt. apts r/s for 04/04/20. Daughter given new apts times.

## 2020-03-09 ENCOUNTER — Inpatient Hospital Stay: Payer: Medicare Other

## 2020-03-09 ENCOUNTER — Inpatient Hospital Stay: Payer: Medicare Other | Admitting: Internal Medicine

## 2020-03-30 ENCOUNTER — Other Ambulatory Visit: Payer: Self-pay

## 2020-03-30 DIAGNOSIS — Z853 Personal history of malignant neoplasm of breast: Secondary | ICD-10-CM

## 2020-04-04 ENCOUNTER — Other Ambulatory Visit: Payer: Self-pay

## 2020-04-04 ENCOUNTER — Inpatient Hospital Stay: Payer: Medicare Other

## 2020-04-04 ENCOUNTER — Ambulatory Visit: Payer: Medicare Other

## 2020-04-04 ENCOUNTER — Inpatient Hospital Stay (HOSPITAL_BASED_OUTPATIENT_CLINIC_OR_DEPARTMENT_OTHER): Payer: Medicare Other | Admitting: Internal Medicine

## 2020-04-04 ENCOUNTER — Inpatient Hospital Stay: Payer: Medicare Other | Attending: Internal Medicine

## 2020-04-04 VITALS — BP 137/62 | HR 55 | Temp 98.0°F | Resp 20 | Ht 63.0 in | Wt 220.0 lb

## 2020-04-04 DIAGNOSIS — N189 Chronic kidney disease, unspecified: Secondary | ICD-10-CM | POA: Insufficient documentation

## 2020-04-04 DIAGNOSIS — M549 Dorsalgia, unspecified: Secondary | ICD-10-CM | POA: Diagnosis not present

## 2020-04-04 DIAGNOSIS — C7A019 Malignant carcinoid tumor of the small intestine, unspecified portion: Secondary | ICD-10-CM

## 2020-04-04 DIAGNOSIS — Z853 Personal history of malignant neoplasm of breast: Secondary | ICD-10-CM

## 2020-04-04 DIAGNOSIS — E039 Hypothyroidism, unspecified: Secondary | ICD-10-CM | POA: Diagnosis not present

## 2020-04-04 DIAGNOSIS — C7B02 Secondary carcinoid tumors of liver: Secondary | ICD-10-CM | POA: Insufficient documentation

## 2020-04-04 DIAGNOSIS — K6389 Other specified diseases of intestine: Secondary | ICD-10-CM | POA: Diagnosis not present

## 2020-04-04 DIAGNOSIS — I130 Hypertensive heart and chronic kidney disease with heart failure and stage 1 through stage 4 chronic kidney disease, or unspecified chronic kidney disease: Secondary | ICD-10-CM | POA: Diagnosis not present

## 2020-04-04 DIAGNOSIS — D509 Iron deficiency anemia, unspecified: Secondary | ICD-10-CM | POA: Insufficient documentation

## 2020-04-04 DIAGNOSIS — E340 Carcinoid syndrome: Secondary | ICD-10-CM | POA: Insufficient documentation

## 2020-04-04 LAB — CBC WITH DIFFERENTIAL/PLATELET
Abs Immature Granulocytes: 0.03 10*3/uL (ref 0.00–0.07)
Basophils Absolute: 0.1 10*3/uL (ref 0.0–0.1)
Basophils Relative: 1 %
Eosinophils Absolute: 0.5 10*3/uL (ref 0.0–0.5)
Eosinophils Relative: 6 %
HCT: 37.9 % (ref 36.0–46.0)
Hemoglobin: 12.7 g/dL (ref 12.0–15.0)
Immature Granulocytes: 0 %
Lymphocytes Relative: 3 %
Lymphs Abs: 0.3 10*3/uL — ABNORMAL LOW (ref 0.7–4.0)
MCH: 30.9 pg (ref 26.0–34.0)
MCHC: 33.5 g/dL (ref 30.0–36.0)
MCV: 92.2 fL (ref 80.0–100.0)
Monocytes Absolute: 1 10*3/uL (ref 0.1–1.0)
Monocytes Relative: 12 %
Neutro Abs: 6.6 10*3/uL (ref 1.7–7.7)
Neutrophils Relative %: 78 %
Platelets: 139 10*3/uL — ABNORMAL LOW (ref 150–400)
RBC: 4.11 MIL/uL (ref 3.87–5.11)
RDW: 14.3 % (ref 11.5–15.5)
WBC: 8.5 10*3/uL (ref 4.0–10.5)
nRBC: 0 % (ref 0.0–0.2)

## 2020-04-04 LAB — COMPREHENSIVE METABOLIC PANEL
ALT: 17 U/L (ref 0–44)
AST: 24 U/L (ref 15–41)
Albumin: 3.6 g/dL (ref 3.5–5.0)
Alkaline Phosphatase: 60 U/L (ref 38–126)
Anion gap: 10 (ref 5–15)
BUN: 23 mg/dL (ref 8–23)
CO2: 26 mmol/L (ref 22–32)
Calcium: 8.9 mg/dL (ref 8.9–10.3)
Chloride: 103 mmol/L (ref 98–111)
Creatinine, Ser: 0.73 mg/dL (ref 0.44–1.00)
GFR, Estimated: >60 mL/min (ref >60)
Glucose, Bld: 130 mg/dL — ABNORMAL HIGH (ref 70–99)
Potassium: 3.5 mmol/L (ref 3.5–5.1)
Sodium: 139 mmol/L (ref 135–145)
Total Bilirubin: 0.8 mg/dL (ref 0.3–1.2)
Total Protein: 6.5 g/dL (ref 6.5–8.1)

## 2020-04-04 MED ORDER — OCTREOTIDE ACETATE 30 MG IM KIT
30.0000 mg | PACK | Freq: Once | INTRAMUSCULAR | Status: AC
  Administered 2020-04-04: 30 mg via INTRAMUSCULAR
  Filled 2020-04-04: qty 1

## 2020-04-04 NOTE — Progress Notes (Signed)
[facility] OFFICE PROGRESS NOTE  [name], [name] [name], [name] [name], [name] [name], [name]  Cancer Staging No matching staging information was found for the patient.   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   # FEB 2018- Ga PET- uptake in liver/ mesenteric mass; NOV 2019- increase sandostatin to 25m q monthly.   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  [name].  female pleasant patient above  history of metastatic carcinoid tumor currently on Sandostatin/iron deficient anemia question AV malformation is here for follow-up.  Patient reluctantly agreed to proceed with Lutathera infusion.  She received infusion #1 approximately a month ago.  She complains of mild fatigue mild nausea.  Continues to improve.  She also complains of mild hair loss.  Otherwise denies any worsening diarrhea.  Denies any worsening abdominal pain.  No swelling in the legs.  Complains of mild back pain.   Review of Systems  Constitutional: Positive for malaise/fatigue. Negative for chills, diaphoresis, fever and weight loss.  HENT: Negative for nosebleeds and sore throat.   Eyes: Negative for double vision.  Respiratory: Negative for hemoptysis, sputum production, shortness of breath and wheezing.   Cardiovascular: Negative for chest pain, palpitations and orthopnea.  Gastrointestinal: Positive for abdominal pain and diarrhea. Negative for constipation, heartburn, melena, nausea and vomiting.  Genitourinary: Negative for dysuria, frequency and urgency.  Musculoskeletal: Positive for back pain. Negative for joint pain.  Skin: Negative.  Negative for itching and rash.  Neurological: Negative for dizziness, tingling, focal weakness, weakness and headaches.  Endo/Heme/Allergies: Does not bruise/bleed easily.  Psychiatric/Behavioral: Negative for depression. The patient is not nervous/anxious and does not have insomnia.       PAST MEDICAL HISTORY :  Past Medical History:  Diagnosis Date  . Aromatase inhibitor use   . Breast cancer ([facility] 2014   left breast cancer/radiation  . Breast cancer, left breast ([facility] 06/19/2014  . CHF (congestive heart failure) (HCC)    recent sepsis  . Chronic kidney disease   . Dizziness   . Dyspnea    recently while admitted  .  GERD (gastroesophageal reflux disease)   . Hearing loss   . History of hiatal hernia   . History of kidney stones   . HOH (hard of hearing)     severe  . Hypertension   . Hypothyroidism   . Leg DVT (deep venous thromboembolism), acute ([place]) 07/2015   left leg after fracture  . 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 Pain    chest pain while admitted  . Personal history of radiation therapy 2015   left breast ca  . Thyroid disease     PAST SURGICAL HISTORY :   Past Surgical History:  Procedure Laterality Date  . ABDOMINAL HYSTERECTOMY     partial  . BREAST LUMPECTOMY Left 01/13/2013   invasive mammary carcinoma, clear margins, LN negative  . BREAST LUMPECTOMY WITH SENTINEL LYMPH NODE BIOPSY Left 2014  . CHOLECYSTECTOMY    . COLONOSCOPY WITH PROPOFOL N/A 09/17/2017   Procedure: COLONOSCOPY WITH PROPOFOL;  Surgeon: Jonathon Bellows, MD;  Location: [facility] ENDOSCOPY;  Service: Gastroenterology;  Laterality: N/A;  . CYSTOSCOPY W/ URETERAL STENT PLACEMENT Right 01/07/2016   Procedure: CYSTOSCOPY WITH STENT REPLACEMENT;  Surgeon: Hollice Espy, MD;  Location: [facility] ORS;  Service: Urology;  Laterality: Right;  . CYSTOSCOPY WITH RETROGRADE PYELOGRAM, URETEROSCOPY AND STENT PLACEMENT Right 12/19/2015   Procedure: CYSTOSCOPY WITH RETROGRADE PYELOGRAM, URETEROSCOPY AND STENT PLACEMENT;  Surgeon: Ardis Hughs, MD;  Location: [facility] ORS;  Service: Urology;  Laterality: Right;  . URETEROSCOPY WITH HOLMIUM LASER LITHOTRIPSY Right 01/07/2016   Procedure: URETEROSCOPY WITH HOLMIUM LASER LITHOTRIPSY;  Surgeon: Hollice Espy, MD;  Location: [facility] ORS;  Service: Urology;  Laterality: Right;    FAMILY HISTORY :   Family History  Problem Relation Age of Onset  . Breast cancer Mother 61       deceased 16  . Breast cancer Maternal Aunt 68       deceased 55s  . Prostate cancer Father        deceased 19  . Colon cancer Paternal Aunt 80       deceased 73s    SOCIAL HISTORY:   Social History   Tobacco Use  . Smoking status: Never Smoker  . Smokeless tobacco: Never Used  Substance Use Topics  . Alcohol use: No    Alcohol/week: 0.0 standard drinks  .  Drug use: No    ALLERGIES:  is allergic to sulfa antibiotics.  MEDICATIONS:  Current Outpatient Medications  Medication Sig Dispense Refill  . Calcium Carb-Cholecalciferol (CALCIUM 600 + D PO) Take 1 tablet by mouth daily.    . furosemide (LASIX) 40 MG tablet Take 40 mg by mouth daily as needed for fluid or edema.     Marland Kitchen labetalol (NORMODYNE) 200 MG tablet Take 200 mg by mouth 2 (two) times daily.     Marland Kitchen levothyroxine (SYNTHROID) 88 MCG tablet Take 88 mcg by mouth daily before breakfast.     . losartan-hydrochlorothiazide (HYZAAR) 100-25 MG tablet Take 1 tablet by mouth daily.    Marland Kitchen nystatin (MYCOSTATIN/NYSTOP) powder APPLY TO AFFECTED AREA TWICE A DAY 15 g 3  . octreotide (SANDOSTATIN LAR) 30 MG injection Inject 30 mg into the muscle every 28 (twenty-eight) days.     . ondansetron (ZOFRAN) 8 MG tablet Take 1 tablet (8 mg total) by mouth every 8 (eight) hours as needed for nausea or vomiting. 30 tablet 3  . ondansetron (ZOFRAN) 8 MG tablet Take 1 tablet (8 mg total) by mouth 2 (two) times daily as needed for nausea or vomiting.  20 tablet 0  . potassium chloride SA (KLOR-CON M20) 20 MEQ tablet TAKE 1 TABLET BY MOUTH EVERY DAY 90 tablet 2  . Vitamin D, Ergocalciferol, (DRISDOL) 1.25 MG (50000 UNIT) CAPS capsule TAKE 1 CAPSULE BY MOUTH ONE TIME PER WEEK 12 capsule 3  . hyoscyamine (ANASPAZ) 0.125 MG TBDP disintergrating tablet Place 0.25 mg under the tongue every 4 (four) hours as needed for cramping. (Patient not taking: Reported on 04/04/2020)    . lutetium Lu 177 dotatate (LUTATHERA) 370 MBQ/ML SOLN Inject into the vein.    Marland Kitchen traMADol (ULTRAM) 50 MG tablet 0.5 tablet to 1 tablet every 8 hours as needed for pain (Patient not taking: Reported on 04/04/2020) 30 tablet 0   No current facility-administered medications for this visit.    PHYSICAL EXAMINATION: ECOG PERFORMANCE STATUS: 1 - Symptomatic but completely ambulatory  BP 137/62   Pulse (!) 55   Temp 98 F (36.7 C) (Oral)   Resp 20   Ht  5' 3"  (1.6 m)   Wt 220 lb (99.8 kg)   SpO2 95%   BMI 38.97 kg/m   Filed Weights   04/04/20 1318  Weight: 220 lb (99.8 kg)    Physical Exam Constitutional:      Comments: Patient is a wheelchair.  She is alone.  Patient is hard of hearing.  HENT:     Head: Normocephalic and atraumatic.     Mouth/Throat:     Pharynx: No oropharyngeal exudate.  Eyes:     Pupils: Pupils are equal, round, and reactive to light.  Cardiovascular:     Rate and Rhythm: Normal rate and regular rhythm.  Pulmonary:     Effort: Pulmonary effort is normal. No respiratory distress.     Breath sounds: Normal breath sounds. No wheezing.  Abdominal:     General: Bowel sounds are normal. There is no distension.     Palpations: Abdomen is soft. There is no mass.     Tenderness: There is no abdominal tenderness. There is no guarding or rebound.  Musculoskeletal:        General: Swelling present. No tenderness. Normal range of motion.     Cervical back: Normal range of motion and neck supple.  Skin:    General: Skin is warm.  Neurological:     Mental Status: She is alert and oriented to person, place, and time.  Psychiatric:        Mood and Affect: Affect normal.     LABORATORY DATA:  I have reviewed the data as listed    Component Value Date/Time   NA 139 04/04/2020 1259   NA 137 07/12/2013 0841   K 3.5 04/04/2020 1259   K 4.7 07/12/2013 0841   CL 103 04/04/2020 1259   CL 107 07/12/2013 0841   CO2 26 04/04/2020 1259   CO2 20 (L) 07/12/2013 0841   GLUCOSE 130 (H) 04/04/2020 1259   GLUCOSE 122 (H) 07/12/2013 0841   BUN 23 04/04/2020 1259   BUN 18 07/12/2013 0841   CREATININE 0.73 04/04/2020 1259   CREATININE 0.64 05/22/2014 1111   CALCIUM 8.9 04/04/2020 1259   CALCIUM 9.2 07/12/2013 0841   PROT 6.5 04/04/2020 1259   PROT 7.0 05/22/2014 1111   ALBUMIN 3.6 04/04/2020 1259   ALBUMIN 4.2 05/22/2014 1111   AST 24 04/04/2020 1259   AST 29 05/22/2014 1111   ALT 17 04/04/2020 1259   ALT 17  05/22/2014 1111   ALKPHOS 60 04/04/2020 1259   ALKPHOS 95 05/22/2014  1111   BILITOT 0.8 04/04/2020 1259   BILITOT 0.8 05/22/2014 1111   GFRNONAA >60 04/04/2020 1259   GFRNONAA >60 05/22/2014 1111   GFRAA >60 10/07/2019 1319   GFRAA >60 05/22/2014 1111    No results found for: SPEP, UPEP  Lab Results  Component Value Date   WBC 8.5 04/04/2020   NEUTROABS 6.6 04/04/2020   HGB 12.7 04/04/2020   HCT 37.9 04/04/2020   MCV 92.2 04/04/2020   PLT 139 (L) 04/04/2020      Chemistry      Component Value Date/Time   NA 139 04/04/2020 1259   NA 137 07/12/2013 0841   K 3.5 04/04/2020 1259   K 4.7 07/12/2013 0841   CL 103 04/04/2020 1259   CL 107 07/12/2013 0841   CO2 26 04/04/2020 1259   CO2 20 (L) 07/12/2013 0841   BUN 23 04/04/2020 1259   BUN 18 07/12/2013 0841   CREATININE 0.73 04/04/2020 1259   CREATININE 0.64 05/22/2014 1111      Component Value Date/Time   CALCIUM 8.9 04/04/2020 1259   CALCIUM 9.2 07/12/2013 0841   ALKPHOS 60 04/04/2020 1259   ALKPHOS 95 05/22/2014 1111   AST 24 04/04/2020 1259   AST 29 05/22/2014 1111   ALT 17 04/04/2020 1259   ALT 17 05/22/2014 1111   BILITOT 0.8 04/04/2020 1259   BILITOT 0.8 05/22/2014 1111       RADIOGRAPHIC STUDIES: I have personally reviewed the radiological images as listed and agreed with the findings in the report. No results found.   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 On Lutathera + sandostatin.  #Patient status post Lutathera infusion #1 approximately a month ago.  Proceed with Sandostatin today.  Patient due for Lutathera infusion/Sandostatin 1 month in  [place].  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  Gennie Alma [id number]- Iron sat-9%];HOLD IV Venofer.  Hemoglobin 13.3.    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  If worse would recommend imaging.  # DISPOSITION- # Sandostatin today;  # Follow up in 2 month; MD cbc/cmp -Sandstatin; possible venofer;  Dr.B     Orders Placed This Encounter  Procedures  . CBC with Differential    Standing Status:   Future    Standing Expiration Date:   04/04/2021  . Comprehensive metabolic panel    Standing Status:   Future    Standing Expiration Date:   04/04/2021   All questions were answered. The patient knows to call the clinic with any problems, questions or concerns.      [name], MD 04/04/2020 2:46 PM

## 2020-04-04 NOTE — Assessment & Plan Note (Signed)
#   Non- functional Small bowel/mesenteric carcinoid; STAGE IV-on octreotide injections monthly; July 2021-gallium PET scan [compared to January -Overall stable/no evidence of obvious progression; however slight increase in size of the mesenteric mass/liver lesion by few millimeters; also slight increase in SUV uptake. On Lutathera + sandostatin.  #Patient status post Lutathera infusion #1 approximately a month ago.  Proceed with Sandostatin today.  Patient due for Lutathera infusion/Sandostatin 1 month in [place].  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  Gennie Alma [id number]- Iron sat-9%];HOLD IV Venofer.  Hemoglobin 13.3.    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  If worse would recommend imaging.  # DISPOSITION- # Sandostatin today;  # Follow up in 2 month; MD cbc/cmp -Sandstatin; possible venofer;  Dr.B

## 2020-04-10 ENCOUNTER — Other Ambulatory Visit: Payer: Self-pay | Admitting: Internal Medicine

## 2020-04-16 ENCOUNTER — Telehealth: Payer: Self-pay | Admitting: *Deleted

## 2020-04-16 ENCOUNTER — Other Ambulatory Visit: Payer: Self-pay

## 2020-04-16 DIAGNOSIS — K6389 Other specified diseases of intestine: Secondary | ICD-10-CM

## 2020-04-16 NOTE — Telephone Encounter (Signed)
Patient needs to be evaluated in [facility].

## 2020-04-16 NOTE — Telephone Encounter (Signed)
Emma [name] called reporting that patient is complaining of "a raw place in her stomach" and that she can feel the food hit this place every times she eats and it gives her a burning sensation. Emma [name] asks if this is her cancer or if she could have an ulcer. Please advise

## 2020-04-17 ENCOUNTER — Inpatient Hospital Stay: Payer: Medicare Other

## 2020-04-17 ENCOUNTER — Inpatient Hospital Stay (HOSPITAL_BASED_OUTPATIENT_CLINIC_OR_DEPARTMENT_OTHER): Payer: Medicare Other | Admitting: Nurse Practitioner

## 2020-04-17 ENCOUNTER — Encounter: Payer: Self-pay | Admitting: Nurse Practitioner

## 2020-04-17 ENCOUNTER — Telehealth: Payer: Self-pay

## 2020-04-17 VITALS — BP 142/68 | HR 53 | Temp 97.8°F | Resp 20 | Wt 204.6 lb

## 2020-04-17 DIAGNOSIS — R1012 Left upper quadrant pain: Secondary | ICD-10-CM

## 2020-04-17 DIAGNOSIS — K6389 Other specified diseases of intestine: Secondary | ICD-10-CM

## 2020-04-17 DIAGNOSIS — C7A019 Malignant carcinoid tumor of the small intestine, unspecified portion: Secondary | ICD-10-CM | POA: Diagnosis not present

## 2020-04-17 LAB — COMPREHENSIVE METABOLIC PANEL
ALT: 17 U/L (ref 0–44)
AST: 27 U/L (ref 15–41)
Albumin: 3.8 g/dL (ref 3.5–5.0)
Alkaline Phosphatase: 66 U/L (ref 38–126)
Anion gap: 12 (ref 5–15)
BUN: 18 mg/dL (ref 8–23)
CO2: 23 mmol/L (ref 22–32)
Calcium: 9 mg/dL (ref 8.9–10.3)
Chloride: 103 mmol/L (ref 98–111)
Creatinine, Ser: 0.75 mg/dL (ref 0.44–1.00)
GFR, Estimated: >60 mL/min (ref >60)
Glucose, Bld: 141 mg/dL — ABNORMAL HIGH (ref 70–99)
Potassium: 3.6 mmol/L (ref 3.5–5.1)
Sodium: 138 mmol/L (ref 135–145)
Total Bilirubin: 0.9 mg/dL (ref 0.3–1.2)
Total Protein: 6.7 g/dL (ref 6.5–8.1)

## 2020-04-17 LAB — CBC WITH DIFFERENTIAL/PLATELET
Abs Immature Granulocytes: 0.01 10*3/uL (ref 0.00–0.07)
Basophils Absolute: 0.1 10*3/uL (ref 0.0–0.1)
Basophils Relative: 1 %
Eosinophils Absolute: 0.6 10*3/uL — ABNORMAL HIGH (ref 0.0–0.5)
Eosinophils Relative: 11 %
HCT: 37.7 % (ref 36.0–46.0)
Hemoglobin: 12.8 g/dL (ref 12.0–15.0)
Immature Granulocytes: 0 %
Lymphocytes Relative: 5 %
Lymphs Abs: 0.3 10*3/uL — ABNORMAL LOW (ref 0.7–4.0)
MCH: 30.9 pg (ref 26.0–34.0)
MCHC: 34 g/dL (ref 30.0–36.0)
MCV: 91.1 fL (ref 80.0–100.0)
Monocytes Absolute: 0.7 10*3/uL (ref 0.1–1.0)
Monocytes Relative: 13 %
Neutro Abs: 4.1 10*3/uL (ref 1.7–7.7)
Neutrophils Relative %: 70 %
Platelets: 146 10*3/uL — ABNORMAL LOW (ref 150–400)
RBC: 4.14 MIL/uL (ref 3.87–5.11)
RDW: 14.4 % (ref 11.5–15.5)
WBC: 5.7 10*3/uL (ref 4.0–10.5)
nRBC: 0 % (ref 0.0–0.2)

## 2020-04-17 NOTE — Progress Notes (Signed)
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   # FEB 2018- Ga PET- uptake in liver/ mesenteric mass; NOV 2019- increase sandostatin to 93m q monthly.   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  This pain just started in the past few weeks but is persisted since that time has become more bothersome.  She feels like she has a raw area and possibly an ulcer.  Has not been taking anything for pain.  No nausea or vomiting.  Appetite is reduced.  She has started Lutathera treatment in February.  Next one scheduled for April.  No bowel symptoms.  Otherwise feels at baseline.   Review of systems- Review of Systems  Constitutional: Positive for malaise/fatigue. Negative for chills, fever and weight loss.  HENT: Negative for hearing loss, nosebleeds, sore throat and tinnitus.   Eyes: Negative for blurred vision and double vision.  Respiratory: Negative for cough, hemoptysis, shortness of breath and wheezing.   Cardiovascular: Negative for chest pain, palpitations and leg swelling.  Gastrointestinal: Positive for abdominal pain. Negative for blood in stool, constipation, diarrhea, melena, nausea and vomiting.  Genitourinary: Negative for dysuria and urgency.  Musculoskeletal: Negative for back pain, falls, joint pain and myalgias.  Skin: Negative for itching and rash.  Neurological: Negative for dizziness, tingling, sensory change, loss of consciousness, weakness and headaches.  Endo/Heme/Allergies: Negative for environmental allergies. Does not bruise/bleed easily.  Psychiatric/Behavioral: Negative for depression. The patient is not nervous/anxious and does not have insomnia.      Allergies  Allergen Reactions  . Sulfa Antibiotics Other (See Comments)    Other reaction(s): Kidney Disorder  Past Medical History:  Diagnosis Date  . Aromatase inhibitor use   . Breast cancer ([place]) 2014   left breast cancer/radiation   . Breast cancer, left breast ([place]) 06/19/2014  . CHF (congestive heart failure) (HCC)    recent sepsis  . Chronic kidney disease   . Dizziness   . Dyspnea    recently while admitted  . GERD (gastroesophageal reflux disease)   . Hearing loss   . History of hiatal hernia   . History of kidney stones   . HOH (hard of hearing)    severe  . Hypertension   . Hypothyroidism   . Leg DVT (deep venous thromboembolism), acute ([place]) 07/2015   left leg after fracture  . 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 Pain    chest pain while admitted  . Personal history of radiation therapy 2015   left breast ca  . Thyroid disease     Past Surgical History:  Procedure Laterality Date  . ABDOMINAL HYSTERECTOMY     partial  . BREAST LUMPECTOMY Left 01/13/2013   invasive mammary carcinoma, clear margins, LN negative  . BREAST LUMPECTOMY WITH SENTINEL LYMPH NODE BIOPSY Left 2014  . CHOLECYSTECTOMY    . COLONOSCOPY WITH PROPOFOL N/A 09/17/2017   Procedure: COLONOSCOPY WITH PROPOFOL;  Surgeon: Jonathon Bellows, MD;  [facility] ENDOSCOPY;  Service: Gastroenterology;  Laterality: N/A;  . CYSTOSCOPY W/ URETERAL STENT PLACEMENT Right 01/07/2016   Procedure: CYSTOSCOPY WITH STENT REPLACEMENT;  Surgeon: Hollice Espy, MD;  Location: [facility] ORS;  Service: Urology;  Laterality: Right;  . CYSTOSCOPY WITH RETROGRADE PYELOGRAM, URETEROSCOPY AND STENT PLACEMENT Right 12/19/2015   Procedure: CYSTOSCOPY WITH RETROGRADE PYELOGRAM, URETEROSCOPY AND STENT PLACEMENT;  Surgeon: Ardis Hughs, MD;  Location: [facility] ORS;  Service: Urology;  Laterality: Right;  . URETEROSCOPY WITH HOLMIUM LASER LITHOTRIPSY Right 01/07/2016   Procedure: URETEROSCOPY WITH HOLMIUM LASER LITHOTRIPSY;  Surgeon: Hollice Espy, MD;  Location: [facility] ORS;  Service: Urology;  Laterality: Right;    Social History   Socioeconomic History  . Marital status: Widowed    Spouse name: Not on file  . Number of children: Not on file  . Years of education: Not  on file  . Highest education level: Not on file  Occupational History  . Not on file  Tobacco Use  . Smoking status: Never Smoker  . Smokeless tobacco: Never Used  Substance and Sexual Activity  . Alcohol use: No    Alcohol/week: 0.0 standard drinks  . Drug use: No  . Sexual activity: Not on file  Other Topics Concern  . Not on file  Social History Narrative  . Not on file   Social Determinants of Health   Financial Resource Strain: Not on file  Food Insecurity: Not on file  Transportation Needs: Not on file  Physical Activity: Not on file  Stress: Not on file  Social Connections: Not on file  Intimate Partner Violence: Not on file    Family History  Problem Relation Age of Onset  . Breast cancer Mother 54       deceased 59  . Breast cancer Maternal Aunt 68       deceased 52s  . Prostate cancer Father        deceased 31  . Colon cancer Paternal Aunt 80       deceased 48s     Current Outpatient Medications:  .  Calcium Carb-Cholecalciferol (CALCIUM 600 + D PO), Take 1 tablet by mouth daily., Disp: , Rfl:  .  furosemide (LASIX) 40 MG tablet, Take 40 mg by mouth daily as needed for fluid or edema. , Disp: , Rfl:  .  hyoscyamine (ANASPAZ) 0.125 MG TBDP disintergrating tablet, Place 0.25 mg under the tongue every 4 (four) hours as needed for cramping., Disp: , Rfl:  .  labetalol (NORMODYNE) 200 MG tablet, Take 200 mg by mouth 2 (two) times daily. , Disp: , Rfl:  .  levothyroxine (SYNTHROID) 88 MCG tablet, Take 88 mcg by mouth daily before breakfast. , Disp: , Rfl:  .  losartan-hydrochlorothiazide (HYZAAR) 100-25 MG tablet, Take 1 tablet by mouth daily., Disp: , Rfl:  .  nystatin (MYCOSTATIN/NYSTOP) powder, APPLY TO AFFECTED AREA TWICE A DAY, Disp: 15 g, Rfl: 3 .  octreotide (SANDOSTATIN LAR) 30 MG injection, Inject 30 mg into the muscle every 28 (twenty-eight) days. , Disp: , Rfl:  .  ondansetron (ZOFRAN) 8 MG tablet, Take 1 tablet (8 mg total) by mouth every 8 (eight)  hours as needed for nausea or vomiting., Disp: 30 tablet, Rfl: 3 .  ondansetron (ZOFRAN) 8 MG tablet, Take 1 tablet (8 mg total) by mouth 2 (two) times daily as needed for nausea or vomiting., Disp: 20 tablet, Rfl: 0 .  potassium chloride SA (KLOR-CON M20) 20 MEQ tablet, TAKE 1 TABLET BY MOUTH EVERY DAY, Disp: 90 tablet, Rfl: 2 .  traMADol (ULTRAM) 50 MG tablet, 0.5 tablet to 1 tablet every 8 hours as needed for pain, Disp: 30 tablet, Rfl: 0 .  Vitamin D, Ergocalciferol, (DRISDOL) 1.25 MG (50000 UNIT) CAPS capsule, TAKE 1 CAPSULE BY MOUTH ONE TIME PER WEEK, Disp: 12 capsule, Rfl: 3 .  lutetium Lu 177 dotatate (LUTATHERA) 370 MBQ/ML SOLN, Inject into the vein. (Patient not taking: Reported on 04/17/2020), Disp: , Rfl:   Physical exam:  Vitals:   04/17/20 1442  BP: (!) 142/68  Pulse: (!) 53  Resp: 20  Temp: 97.8 F (36.6 C)  TempSrc: Tympanic  SpO2: 97%  Weight: 204 lb 9.6 oz (92.8 kg)   Physical Exam Constitutional:      Appearance: She is well-developed.  HENT:     Head: Atraumatic.     Nose: Nose normal.     Mouth/Throat:     Pharynx: No oropharyngeal exudate.  Eyes:     General: No scleral icterus.    Conjunctiva/sclera: Conjunctivae normal.  Cardiovascular:     Rate and Rhythm: Normal rate and regular rhythm.  Pulmonary:     Effort: Pulmonary effort is normal.     Breath sounds: Normal breath sounds.  Abdominal:     General: Bowel sounds are normal. There is no distension.     Palpations: Abdomen is soft.  Musculoskeletal:        General: Normal range of motion.     Cervical back: Normal range of motion and neck supple.  Skin:    General: Skin is warm and dry.  Neurological:     Mental Status: She is alert and oriented to person, place, and time.  Psychiatric:        Mood and Affect: Mood normal.        Behavior: Behavior normal.      CMP Latest Ref Rng & Units 04/17/2020  Glucose 70 - 99 mg/dL 141(H)  BUN 8 - 23 mg/dL 18  Creatinine 0.44 - 1.00 mg/dL 0.75   Sodium 135 - 145 mmol/L 138  Potassium 3.5 - 5.1 mmol/L 3.6  Chloride 98 - 111 mmol/L 103  CO2 22 - 32 mmol/L  23  Calcium 8.9 - 10.3 mg/dL 9.0  Total Protein 6.5 - 8.1 g/dL 6.7  Total Bilirubin 0.3 - 1.2 mg/dL 0.9  Alkaline Phos 38 - 126 U/L 66  AST 15 - 41 U/L 27  ALT 0 - 44 U/L 17   CBC Latest Ref Rng & Units 04/17/2020  WBC 4.0 - 10.5 K/uL 5.7  Hemoglobin 12.0 - 15.0 g/dL 12.8  Hematocrit 36.0 - 46.0 % 37.7  Platelets 150 - 400 K/uL 146(L)    No images are attached to the encounter.  No results found.  Assessment and plan- Patient is a 80 y.o. female diagnosed with mesenteric carcinoid who presents to symptom management clinic for abdominal pain.  Suspect symptoms are reflective of her known malignancy however, new symptoms and area of discomfort. Will get ct abdomen to evaluate further. CT shows essentially stable disease. Will do trial of PPI.   Follow up with Dr. Rogue Bussing as scheduled. Return to clinic if symptoms don't improve or worsen in the interim.    Visit Diagnosis 1. Left upper quadrant abdominal pain     Patient expressed understanding and was in agreement with this plan. She also understands that She can call clinic at any time with any questions, concerns, or complaints.   Thank you for allowing me to participate in the care of this very pleasant patient.   Beckey Rutter, DNP, AGNP-[facility]  CC: Dr. Rogue Bussing

## 2020-04-17 NOTE — Progress Notes (Signed)
Patient here for Symptom Management she reports the she has been experiencing sharp abdominal pain after eating, she does not remember when is started.

## 2020-04-17 NOTE — Telephone Encounter (Signed)
Phoned patient on 04/16/20 and spoke to pt's daughter. Informed her that Dr. Rogue Bussing recommends evaluation in symptom management. Daughter verbalized understanding and requests afternoon appointment. Patient scheduled for 2pm in [facility] with labs at 145pm on 3/22.

## 2020-04-19 ENCOUNTER — Ambulatory Visit: Admission: RE | Admit: 2020-04-19 | Payer: Medicare Other | Source: Ambulatory Visit

## 2020-04-20 ENCOUNTER — Ambulatory Visit
Admission: RE | Admit: 2020-04-20 | Discharge: 2020-04-20 | Disposition: A | Discharge status: Home / Self Care | Payer: Medicare Other | Source: Ambulatory Visit | Attending: Nurse Practitioner | Admitting: Nurse Practitioner

## 2020-04-20 ENCOUNTER — Other Ambulatory Visit: Payer: Self-pay

## 2020-04-20 DIAGNOSIS — R1012 Left upper quadrant pain: Secondary | ICD-10-CM

## 2020-04-20 MED ORDER — IOHEXOL 300 MG/ML  SOLN
100.0000 mL | Freq: Once | INTRAMUSCULAR | Status: AC | PRN
  Administered 2020-04-20: 100 mL via INTRAVENOUS

## 2020-04-20 MED ORDER — PANTOPRAZOLE SODIUM 40 MG PO TBEC: 40.0000 mg | DELAYED_RELEASE_TABLET | Freq: Every day | ORAL | 0 refills | Status: DC

## 2020-05-02 ENCOUNTER — Other Ambulatory Visit: Payer: Self-pay

## 2020-05-02 ENCOUNTER — Ambulatory Visit (HOSPITAL_COMMUNITY)
Admission: RE | Admit: 2020-05-02 | Discharge: 2020-05-02 | Disposition: A | Discharge status: Home / Self Care | Payer: Medicare Other | Source: Ambulatory Visit | Attending: Internal Medicine | Admitting: Internal Medicine

## 2020-05-02 ENCOUNTER — Other Ambulatory Visit (HOSPITAL_COMMUNITY): Payer: Self-pay | Admitting: Internal Medicine

## 2020-05-02 DIAGNOSIS — C7B02 Secondary carcinoid tumors of liver: Secondary | ICD-10-CM | POA: Diagnosis present

## 2020-05-02 DIAGNOSIS — C7A8 Other malignant neuroendocrine tumors: Secondary | ICD-10-CM | POA: Diagnosis not present

## 2020-05-02 LAB — COMPREHENSIVE METABOLIC PANEL
ALT: 19 U/L (ref 0–44)
AST: 24 U/L (ref 15–41)
Albumin: 3.6 g/dL (ref 3.5–5.0)
Alkaline Phosphatase: 68 U/L (ref 38–126)
Anion gap: 10 (ref 5–15)
BUN: 16 mg/dL (ref 8–23)
CO2: 25 mmol/L (ref 22–32)
Calcium: 9.3 mg/dL (ref 8.9–10.3)
Chloride: 106 mmol/L (ref 98–111)
Creatinine, Ser: 0.77 mg/dL (ref 0.44–1.00)
GFR, Estimated: >60 mL/min (ref >60)
Glucose, Bld: 130 mg/dL — ABNORMAL HIGH (ref 70–99)
Potassium: 3.4 mmol/L — ABNORMAL LOW (ref 3.5–5.1)
Sodium: 141 mmol/L (ref 135–145)
Total Bilirubin: 0.9 mg/dL (ref 0.3–1.2)
Total Protein: 6.5 g/dL (ref 6.5–8.1)

## 2020-05-02 LAB — CBC WITH DIFFERENTIAL/PLATELET
Abs Immature Granulocytes: 0.01 10*3/uL (ref 0.00–0.07)
Basophils Absolute: 0.1 10*3/uL (ref 0.0–0.1)
Basophils Relative: 1 %
Eosinophils Absolute: 0.7 10*3/uL — ABNORMAL HIGH (ref 0.0–0.5)
Eosinophils Relative: 14 %
HCT: 36.3 % (ref 36.0–46.0)
Hemoglobin: 12.2 g/dL (ref 12.0–15.0)
Immature Granulocytes: 0 %
Lymphocytes Relative: 8 %
Lymphs Abs: 0.4 10*3/uL — ABNORMAL LOW (ref 0.7–4.0)
MCH: 30.8 pg (ref 26.0–34.0)
MCHC: 33.6 g/dL (ref 30.0–36.0)
MCV: 91.7 fL (ref 80.0–100.0)
Monocytes Absolute: 0.8 10*3/uL (ref 0.1–1.0)
Monocytes Relative: 15 %
Neutro Abs: 3.3 10*3/uL (ref 1.7–7.7)
Neutrophils Relative %: 62 %
Platelets: 159 10*3/uL (ref 150–400)
RBC: 3.96 MIL/uL (ref 3.87–5.11)
RDW: 14.3 % (ref 11.5–15.5)
WBC: 5.3 10*3/uL (ref 4.0–10.5)
nRBC: 0 % (ref 0.0–0.2)

## 2020-05-02 MED ORDER — AMINO ACID RADIOPROTECTANT - L-LYSINE 2.5%/L-ARGININE 2.5% IN NS
250.0000 mL/h | INTRAVENOUS | Status: DC
  Administered 2020-05-02: 250 mL/h via INTRAVENOUS
  Filled 2020-05-02: qty 1000

## 2020-05-02 MED ORDER — ONDANSETRON HCL 8 MG PO TABS: 8.0000 mg | ORAL_TABLET | Freq: Two times a day (BID) | ORAL | 0 refills | Status: DC | PRN

## 2020-05-02 MED ORDER — OCTREOTIDE ACETATE 500 MCG/ML IJ SOLN: 500.0000 ug | Freq: Once | INTRAMUSCULAR | Status: DC | PRN

## 2020-05-02 MED ORDER — PROCHLORPERAZINE EDISYLATE 10 MG/2ML IJ SOLN
10.0000 mg | Freq: Four times a day (QID) | INTRAMUSCULAR | Status: DC | PRN
Start: 2020-05-02 — End: 2020-05-08

## 2020-05-02 MED ORDER — HEPARIN SOD (PORK) LOCK FLUSH 100 UNIT/ML IV SOLN
INTRAVENOUS | Status: AC
  Filled 2020-05-02: qty 5

## 2020-05-02 MED ORDER — SODIUM CHLORIDE 0.9 % IV SOLN
8.0000 mg | Freq: Once | INTRAVENOUS | Status: AC
  Administered 2020-05-02: 8 mg via INTRAVENOUS
  Filled 2020-05-02: qty 4

## 2020-05-02 MED ORDER — LIDOCAINE HCL 1 % IJ SOLN
INTRAMUSCULAR | Status: DC | PRN
  Administered 2020-05-02: 5 mL

## 2020-05-02 MED ORDER — LUTETIUM LU 177 DOTATATE 370 MBQ/ML IV SOLN
200.0000 | Freq: Once | INTRAVENOUS | Status: AC
  Administered 2020-05-02: 203.5 via INTRAVENOUS

## 2020-05-02 MED ORDER — OCTREOTIDE ACETATE 30 MG IM KIT
PACK | INTRAMUSCULAR | Status: AC
  Administered 2020-05-02: 30 mg via INTRAMUSCULAR
  Filled 2020-05-02: qty 1

## 2020-05-02 MED ORDER — LIDOCAINE HCL 1 % IJ SOLN
INTRAMUSCULAR | Status: AC
  Filled 2020-05-02: qty 20

## 2020-05-02 MED ORDER — SODIUM CHLORIDE 0.9 % IV SOLN: 500.0000 mL | Freq: Once | INTRAVENOUS | Status: DC

## 2020-05-02 MED ORDER — OCTREOTIDE ACETATE 30 MG IM KIT: 30.0000 mg | PACK | Freq: Once | INTRAMUSCULAR | Status: AC

## 2020-05-02 NOTE — Procedures (Signed)
Successful placement of doubl lumen PICC line to right basilic vein. Length 40 cm Tip at lower SVC/RA PICC capped No complications Ready for use.  EBL < 5 mL   Ascencion Dike PA-C 05/02/2020 9:00 AM

## 2020-05-02 NOTE — Progress Notes (Signed)
Lunch tray ordered for patient at 10:05am.

## 2020-05-08 ENCOUNTER — Telehealth: Payer: Self-pay | Admitting: *Deleted

## 2020-05-08 NOTE — Telephone Encounter (Signed)
Spoke with daughter. Confirmed that picc line was removed after the Lut[place]hera treatment.

## 2020-05-24 ENCOUNTER — Other Ambulatory Visit: Payer: Self-pay | Admitting: Nurse Practitioner

## 2020-05-30 ENCOUNTER — Inpatient Hospital Stay (HOSPITAL_BASED_OUTPATIENT_CLINIC_OR_DEPARTMENT_OTHER): Payer: Medicare Other | Admitting: Internal Medicine

## 2020-05-30 ENCOUNTER — Other Ambulatory Visit: Payer: Self-pay

## 2020-05-30 ENCOUNTER — Inpatient Hospital Stay: Payer: Medicare Other

## 2020-05-30 ENCOUNTER — Inpatient Hospital Stay: Payer: Medicare Other | Attending: Internal Medicine

## 2020-05-30 DIAGNOSIS — C7A019 Malignant carcinoid tumor of the small intestine, unspecified portion: Secondary | ICD-10-CM | POA: Diagnosis not present

## 2020-05-30 DIAGNOSIS — K6389 Other specified diseases of intestine: Secondary | ICD-10-CM

## 2020-05-30 DIAGNOSIS — E340 Carcinoid syndrome: Secondary | ICD-10-CM | POA: Diagnosis not present

## 2020-05-30 DIAGNOSIS — C7B02 Secondary carcinoid tumors of liver: Secondary | ICD-10-CM | POA: Insufficient documentation

## 2020-05-30 DIAGNOSIS — D509 Iron deficiency anemia, unspecified: Secondary | ICD-10-CM | POA: Insufficient documentation

## 2020-05-30 DIAGNOSIS — Z853 Personal history of malignant neoplasm of breast: Secondary | ICD-10-CM | POA: Diagnosis not present

## 2020-05-30 LAB — CBC WITH DIFFERENTIAL/PLATELET
Abs Immature Granulocytes: 0.02 10*3/uL (ref 0.00–0.07)
Basophils Absolute: 0 10*3/uL (ref 0.0–0.1)
Basophils Relative: 1 %
Eosinophils Absolute: 0.3 10*3/uL (ref 0.0–0.5)
Eosinophils Relative: 7 %
HCT: 35.9 % — ABNORMAL LOW (ref 36.0–46.0)
Hemoglobin: 12.1 g/dL (ref 12.0–15.0)
Immature Granulocytes: 0 %
Lymphocytes Relative: 5 %
Lymphs Abs: 0.3 10*3/uL — ABNORMAL LOW (ref 0.7–4.0)
MCH: 31.3 pg (ref 26.0–34.0)
MCHC: 33.7 g/dL (ref 30.0–36.0)
MCV: 93 fL (ref 80.0–100.0)
Monocytes Absolute: 0.7 10*3/uL (ref 0.1–1.0)
Monocytes Relative: 15 %
Neutro Abs: 3.4 10*3/uL (ref 1.7–7.7)
Neutrophils Relative %: 72 %
Platelets: 145 10*3/uL — ABNORMAL LOW (ref 150–400)
RBC: 3.86 MIL/uL — ABNORMAL LOW (ref 3.87–5.11)
RDW: 14.8 % (ref 11.5–15.5)
WBC: 4.7 10*3/uL (ref 4.0–10.5)
nRBC: 0 % (ref 0.0–0.2)

## 2020-05-30 LAB — COMPREHENSIVE METABOLIC PANEL
ALT: 18 U/L (ref 0–44)
AST: 27 U/L (ref 15–41)
Albumin: 3.8 g/dL (ref 3.5–5.0)
Alkaline Phosphatase: 63 U/L (ref 38–126)
Anion gap: 10 (ref 5–15)
BUN: 17 mg/dL (ref 8–23)
CO2: 28 mmol/L (ref 22–32)
Calcium: 9.4 mg/dL (ref 8.9–10.3)
Chloride: 101 mmol/L (ref 98–111)
Creatinine, Ser: 0.71 mg/dL (ref 0.44–1.00)
GFR, Estimated: >60 mL/min (ref >60)
Glucose, Bld: 135 mg/dL — ABNORMAL HIGH (ref 70–99)
Potassium: 4 mmol/L (ref 3.5–5.1)
Sodium: 139 mmol/L (ref 135–145)
Total Bilirubin: 1 mg/dL (ref 0.3–1.2)
Total Protein: 6.7 g/dL (ref 6.5–8.1)

## 2020-05-30 MED ORDER — OCTREOTIDE ACETATE 30 MG IM KIT
30.0000 mg | PACK | Freq: Once | INTRAMUSCULAR | Status: AC
  Administered 2020-05-30: 30 mg via INTRAMUSCULAR
  Filled 2020-05-30: qty 1

## 2020-05-30 NOTE — Assessment & Plan Note (Addendum)
#   Non- functional Small bowel/mesenteric carcinoid; STAGE IV-on octreotide injections monthly; July 2021-gallium PET scan [compared to January -Overall stable/no evidence of obvious progression; however slight increase in size of the mesenteric mass/liver lesion by few millimeters; also slight increase in SUV uptake. On Lutathera + sandostatin.  #Patient status post Lutathera infusion #2 approximately a month ago.  Proceed with Sandostatin today.  Patient due for #3 Lutathera infusion/Sandostatin 1 month in [place].  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  Gennie Alma [id number]- Iron sat-9%];HOLD IV Venofer.  Hemoglobin 13.3.    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  If worse would recommend imaging.-STABLE.   # DISPOSITION- # Sandostatin today;  # Follow up in 2 month; MD cbc/cmp -Sandstatin; possible venofer;  Dr.B

## 2020-05-30 NOTE — Progress Notes (Signed)
Emma [name]  [name], [name] [name], [name] [name], [name] [name], [name]  Cancer Staging No matching staging information was found for the patient.   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   # FEB 2018- Ga PET- uptake in liver/ mesenteric mass; NOV 2019- increase sandostatin to 43m q monthly.   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  Emma [email or web address].[id number].  female pleasant patient above  history of metastatic carcinoid tumor currently on Sandostatin/iron deficient anemia question AV malformation is here for follow-up.  She received infusion #2 approximately a month ago.  Patient complains of mild hair loss.  Otherwise denies any worsening swelling the legs.  Denies any nausea vomiting.  Chronic diarrhea. Denies any worsening abdominal pain.   Review of Systems  Constitutional: Positive for malaise/fatigue. Negative for chills, diaphoresis, fever and weight loss.  HENT: Negative for nosebleeds and sore throat.   Eyes: Negative for double vision.  Respiratory: Negative for hemoptysis, sputum production, shortness of breath and wheezing.   Cardiovascular: Negative for chest pain, palpitations and orthopnea.  Gastrointestinal: Positive for abdominal pain and diarrhea. Negative for constipation, heartburn, melena, nausea and vomiting.  Genitourinary: Negative for dysuria, frequency and urgency.  Musculoskeletal: Positive for back pain. Negative for joint pain.  Skin: Negative.  Negative for itching and rash.  Neurological: Negative for dizziness, tingling, focal weakness, weakness and headaches.  Endo/Heme/Allergies: Does not bruise/bleed easily.  Psychiatric/Behavioral: Negative for depression. The patient is not nervous/anxious and does not have insomnia.       PAST MEDICAL HISTORY :  Past Medical History:  Diagnosis Date  . Aromatase inhibitor use   . Breast cancer ([facility] 2014   left breast cancer/radiation  . Breast cancer, left breast ([facility] 06/19/2014  . CHF (congestive heart failure) (HCC)    recent sepsis  . Chronic kidney disease   . Dizziness   . Dyspnea    recently while admitted  . GERD (gastroesophageal reflux disease)   . Hearing loss   . History of hiatal hernia   . History of kidney stones   .  HOH (hard of hearing)    severe  . Hypertension   . Hypothyroidism   . Leg DVT (deep venous thromboembolism), acute ([place]) 07/2015   left leg after fracture   . 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 Pain    chest pain while admitted  . Personal history of radiation therapy 2015   left breast ca  . Thyroid disease     PAST SURGICAL HISTORY :   Past Surgical History:  Procedure Laterality Date  . ABDOMINAL HYSTERECTOMY     partial  . BREAST LUMPECTOMY Left 01/13/2013   invasive mammary carcinoma, clear margins, LN negative  . BREAST LUMPECTOMY WITH SENTINEL LYMPH NODE BIOPSY Left 2014  . CHOLECYSTECTOMY    . COLONOSCOPY WITH PROPOFOL N/A 09/17/2017   Procedure: COLONOSCOPY WITH PROPOFOL;  Surgeon: Jonathon Bellows, MD;  Location: [facility] ENDOSCOPY;  Service: Gastroenterology;  Laterality: N/A;  . CYSTOSCOPY W/ URETERAL STENT PLACEMENT Right 01/07/2016   Procedure: CYSTOSCOPY WITH STENT REPLACEMENT;  Surgeon: Hollice Espy, MD;  Location: [facility] ORS;  Service: Urology;  Laterality: Right;  . CYSTOSCOPY WITH RETROGRADE PYELOGRAM, URETEROSCOPY AND STENT PLACEMENT Right 12/19/2015   Procedure: CYSTOSCOPY WITH RETROGRADE PYELOGRAM, URETEROSCOPY AND STENT PLACEMENT;  Surgeon: Ardis Hughs, MD;  Location: [facility] ORS;  Service: Urology;  Laterality: Right;  . URETEROSCOPY WITH HOLMIUM LASER LITHOTRIPSY Right 01/07/2016   Procedure: URETEROSCOPY WITH HOLMIUM LASER LITHOTRIPSY;  Surgeon: Hollice Espy, MD;  Location: [facility] ORS;  Service: Urology;  Laterality: Right;    FAMILY HISTORY :   Family History  Problem Relation Age of Onset  . Breast cancer Mother 26       deceased 69  . Breast cancer Maternal Aunt 68       deceased 43s  . Prostate cancer Father        deceased 75  . Colon cancer Paternal Aunt 80       deceased 56s    SOCIAL HISTORY:   Social History   Tobacco Use  . Smoking status: Never Smoker  . Smokeless tobacco: Never Used  Substance Use Topics  . Alcohol use: No    Alcohol/week: 0.0 standard drinks  . Drug use: No    ALLERGIES:  is allergic to sulfa antibiotics.  MEDICATIONS:  Current Outpatient Medications  Medication Sig  Dispense Refill  . Calcium Carb-Cholecalciferol (CALCIUM 600 + D PO) Take 1 tablet by mouth daily.    . furosemide (LASIX) 40 MG tablet Take 40 mg by mouth daily as needed for fluid or edema.     . hyoscyamine (ANASPAZ) 0.125 MG TBDP disintergrating tablet Place 0.25 mg under the tongue every 4 (four) hours as needed for cramping.    . labetalol (NORMODYNE) 200 MG tablet Take 200 mg by mouth 2 (two) times daily.     Marland Kitchen levothyroxine (SYNTHROID) 88 MCG tablet Take 88 mcg by mouth daily before breakfast.     . losartan-hydrochlorothiazide (HYZAAR) 100-25 MG tablet Take 1 tablet by mouth daily.    Marland Kitchen lutetium Lu 177 dotatate (LUTATHERA) 370 MBQ/ML SOLN Inject into the vein.    Marland Kitchen nystatin (MYCOSTATIN/NYSTOP) powder APPLY TO AFFECTED AREA TWICE A DAY 15 g 3  . octreotide (SANDOSTATIN LAR) 30 MG injection Inject 30 mg into the muscle every 28 (twenty-eight) days.     . ondansetron (ZOFRAN) 8 MG tablet Take 1 tablet (8 mg total) by mouth 2 (two) times daily as needed for nausea or vomiting. 20 tablet 0  . pantoprazole (PROTONIX) 40 MG tablet TAKE 1 TABLET  BY MOUTH EVERY DAY 90 tablet 0  . potassium chloride SA (KLOR-CON M20) 20 MEQ tablet TAKE 1 TABLET BY MOUTH EVERY DAY 90 tablet 2  . traMADol (ULTRAM) 50 MG tablet 0.5 tablet to 1 tablet every 8 hours as needed for pain 30 tablet 0  . Vitamin D, Ergocalciferol, (DRISDOL) 1.25 MG (50000 UNIT) CAPS capsule TAKE 1 CAPSULE BY MOUTH ONE TIME PER WEEK 12 capsule 3   No current facility-administered medications for this visit.    PHYSICAL EXAMINATION: ECOG PERFORMANCE STATUS: 1 - Symptomatic but completely ambulatory  BP (!) 146/53   Pulse (!) 57   Temp 97.8 F (36.6 C) (Tympanic)   Resp 20   Ht _0  (1.6 m)   Wt 222 lb 9.6 oz (101 kg)   BMI 39.43 kg/m   Filed Weights   05/30/20 1422  Weight: 222 lb 9.6 oz (101 kg)    Physical Exam Constitutional:      Comments: Patient is a wheelchair.  She is alone.  Patient is hard of hearing.  HENT:      Head: Normocephalic and atraumatic.     Mouth/Throat:     Pharynx: No oropharyngeal exudate.  Eyes:     Pupils: Pupils are equal, round, and reactive to light.  Cardiovascular:     Rate and Rhythm: Normal rate and regular rhythm.  Pulmonary:     Effort: Pulmonary effort is normal. No respiratory distress.     Breath sounds: Normal breath sounds. No wheezing.  Abdominal:     General: Bowel sounds are normal. There is no distension.     Palpations: Abdomen is soft. There is no mass.     Tenderness: There is no abdominal tenderness. There is no guarding or rebound.  Musculoskeletal:        General: Swelling present. No tenderness. Normal range of motion.     Cervical back: Normal range of motion and neck supple.  Skin:    General: Skin is warm.  Neurological:     Mental Status: She is alert and oriented to person, place, and time.  Psychiatric:        Mood and Affect: Affect normal.     LABORATORY DATA:  I have reviewed the data as listed    Component Value Date/Time   NA 139 05/30/2020 1436   NA 137 07/12/2013 0841   K 4.0 05/30/2020 1436   K 4.7 07/12/2013 0841   CL 101 05/30/2020 1436   CL 107 07/12/2013 0841   CO2 28 05/30/2020 1436   CO2 20 (L) 07/12/2013 0841   GLUCOSE 135 (H) 05/30/2020 1436   GLUCOSE 122 (H) 07/12/2013 0841   BUN 17 05/30/2020 1436   BUN 18 07/12/2013 0841   CREATININE 0.71 05/30/2020 1436   CREATININE 0.64 05/22/2014 1111   CALCIUM 9.4 05/30/2020 1436   CALCIUM 9.2 07/12/2013 0841   PROT 6.7 05/30/2020 1436   PROT 7.0 05/22/2014 1111   ALBUMIN 3.8 05/30/2020 1436   ALBUMIN 4.2 05/22/2014 1111   AST 27 05/30/2020 1436   AST 29 05/22/2014 1111   ALT 18 05/30/2020 1436   ALT 17 05/22/2014 1111   ALKPHOS 63 05/30/2020 1436   ALKPHOS 95 05/22/2014 1111   BILITOT 1.0 05/30/2020 1436   BILITOT 0.8 05/22/2014 1111   GFRNONAA >60 05/30/2020 1436   GFRNONAA >60 05/22/2014 1111   GFRAA >60 10/07/2019 1319   GFRAA >60 05/22/2014 1111    No  results found for: SPEP, UPEP  Lab  Results  Component Value Date   WBC 4.7 05/30/2020   NEUTROABS 3.4 05/30/2020   HGB 12.1 05/30/2020   HCT 35.9 (L) 05/30/2020   MCV 93.0 05/30/2020   PLT 145 (L) 05/30/2020      Chemistry      Component Value Date/Time   NA 139 05/30/2020 1436   NA 137 07/12/2013 0841   K 4.0 05/30/2020 1436   K 4.7 07/12/2013 0841   CL 101 05/30/2020 1436   CL 107 07/12/2013 0841   CO2 28 05/30/2020 1436   CO2 20 (L) 07/12/2013 0841   BUN 17 05/30/2020 1436   BUN 18 07/12/2013 0841   CREATININE 0.71 05/30/2020 1436   CREATININE 0.64 05/22/2014 1111      Component Value Date/Time   CALCIUM 9.4 05/30/2020 1436   CALCIUM 9.2 07/12/2013 0841   ALKPHOS 63 05/30/2020 1436   ALKPHOS 95 05/22/2014 1111   AST 27 05/30/2020 1436   AST 29 05/22/2014 1111   ALT 18 05/30/2020 1436   ALT 17 05/22/2014 1111   BILITOT 1.0 05/30/2020 1436   BILITOT 0.8 05/22/2014 1111       RADIOGRAPHIC STUDIES: I have personally reviewed the radiological images as listed and agreed with the findings in the report. No results found.   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 On Lutathera + sandostatin.  #Patient status post Lutathera infusion #2 approximately a month ago.  Proceed with Sandostatin today.  Patient due for #3 Lutathera infusion/Sandostatin 1 month in [place].  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  Emma [name] [id number]- Iron sat-9%];HOLD IV Venofer.  Hemoglobin 13.3.    # chronic mild diarrhea- ? Sec to carcinoid [not on xarmelo secondary insurance issues-chronic stable  #Back pain suspect- MSK- recommend  Tylenol as needed.  If worse would recommend imaging.-STABLE.   # DISPOSITION- # Sandostatin today;  # Follow up in 2 month; MD cbc/cmp -Sandstatin; possible venofer;  Dr.B     Orders Placed This Encounter  Procedures  . CBC with Differential    Standing Status:   Future    Standing Expiration Date:   05/30/2021  . Comprehensive metabolic panel    Standing Status:   Future    Standing Expiration Date:   05/30/2021   All questions were answered. The patient knows to call the clinic with any problems, questions or concerns.      [name], MD 06/05/2020 10:05 PM

## 2020-06-06 ENCOUNTER — Ambulatory Visit: Payer: Medicare Other

## 2020-06-06 ENCOUNTER — Ambulatory Visit: Payer: Medicare Other | Admitting: Internal Medicine

## 2020-06-06 ENCOUNTER — Other Ambulatory Visit: Payer: Medicare Other

## 2020-06-13 ENCOUNTER — Telehealth: Payer: Self-pay | Admitting: *Deleted

## 2020-06-13 NOTE — Telephone Encounter (Signed)
Spoke with pt's daughter. Per [facility] Luthera treatment is not available at this time for the patient. Sandostatin needs to be scheduled.   Apt made for 6/1 for next sandostatin. Daughter agreeable and gave verbal understanding.

## 2020-06-27 ENCOUNTER — Other Ambulatory Visit (HOSPITAL_COMMUNITY): Payer: Medicare Other

## 2020-06-27 ENCOUNTER — Ambulatory Visit (HOSPITAL_COMMUNITY): Admission: RE | Admit: 2020-06-27 | Payer: Medicare Other | Source: Ambulatory Visit

## 2020-06-27 ENCOUNTER — Inpatient Hospital Stay: Payer: Medicare Other | Attending: Internal Medicine

## 2020-06-27 DIAGNOSIS — E039 Hypothyroidism, unspecified: Secondary | ICD-10-CM | POA: Insufficient documentation

## 2020-06-27 DIAGNOSIS — C7B02 Secondary carcinoid tumors of liver: Secondary | ICD-10-CM | POA: Diagnosis not present

## 2020-06-27 DIAGNOSIS — E340 Carcinoid syndrome: Secondary | ICD-10-CM | POA: Diagnosis not present

## 2020-06-27 DIAGNOSIS — I509 Heart failure, unspecified: Secondary | ICD-10-CM | POA: Insufficient documentation

## 2020-06-27 DIAGNOSIS — C7A019 Malignant carcinoid tumor of the small intestine, unspecified portion: Secondary | ICD-10-CM | POA: Diagnosis present

## 2020-06-27 DIAGNOSIS — D509 Iron deficiency anemia, unspecified: Secondary | ICD-10-CM | POA: Diagnosis not present

## 2020-06-27 DIAGNOSIS — K6389 Other specified diseases of intestine: Secondary | ICD-10-CM

## 2020-06-27 MED ORDER — OCTREOTIDE ACETATE 30 MG IM KIT
30.0000 mg | PACK | Freq: Once | INTRAMUSCULAR | Status: AC
  Administered 2020-06-27: 30 mg via INTRAMUSCULAR
  Filled 2020-06-27: qty 1

## 2020-06-28 ENCOUNTER — Other Ambulatory Visit (HOSPITAL_COMMUNITY): Payer: Self-pay | Admitting: Internal Medicine

## 2020-06-28 DIAGNOSIS — C7B02 Secondary carcinoid tumors of liver: Secondary | ICD-10-CM

## 2020-07-12 ENCOUNTER — Other Ambulatory Visit: Payer: Self-pay | Admitting: *Deleted

## 2020-07-13 MED ORDER — PANTOPRAZOLE SODIUM 40 MG PO TBEC: 1.0000 | DELAYED_RELEASE_TABLET | Freq: Every day | ORAL | 0 refills | Status: DC

## 2020-07-25 ENCOUNTER — Inpatient Hospital Stay: Payer: Medicare Other

## 2020-07-25 ENCOUNTER — Other Ambulatory Visit: Payer: Self-pay

## 2020-07-25 ENCOUNTER — Inpatient Hospital Stay (HOSPITAL_BASED_OUTPATIENT_CLINIC_OR_DEPARTMENT_OTHER): Payer: Medicare Other | Admitting: Internal Medicine

## 2020-07-25 ENCOUNTER — Ambulatory Visit: Payer: Medicare Other

## 2020-07-25 VITALS — BP 142/58 | HR 52 | Temp 97.0°F | Resp 17

## 2020-07-25 DIAGNOSIS — C7A019 Malignant carcinoid tumor of the small intestine, unspecified portion: Secondary | ICD-10-CM

## 2020-07-25 DIAGNOSIS — C7B02 Secondary carcinoid tumors of liver: Secondary | ICD-10-CM

## 2020-07-25 DIAGNOSIS — K6389 Other specified diseases of intestine: Secondary | ICD-10-CM

## 2020-07-25 LAB — CBC WITH DIFFERENTIAL/PLATELET
Abs Immature Granulocytes: 0.02 10*3/uL (ref 0.00–0.07)
Basophils Absolute: 0.1 10*3/uL (ref 0.0–0.1)
Basophils Relative: 1 %
Eosinophils Absolute: 0.4 10*3/uL (ref 0.0–0.5)
Eosinophils Relative: 8 %
HCT: 35 % — ABNORMAL LOW (ref 36.0–46.0)
Hemoglobin: 11.7 g/dL — ABNORMAL LOW (ref 12.0–15.0)
Immature Granulocytes: 0 %
Lymphocytes Relative: 6 %
Lymphs Abs: 0.3 10*3/uL — ABNORMAL LOW (ref 0.7–4.0)
MCH: 32.1 pg (ref 26.0–34.0)
MCHC: 33.4 g/dL (ref 30.0–36.0)
MCV: 95.9 fL (ref 80.0–100.0)
Monocytes Absolute: 0.8 10*3/uL (ref 0.1–1.0)
Monocytes Relative: 14 %
Neutro Abs: 3.9 10*3/uL (ref 1.7–7.7)
Neutrophils Relative %: 71 %
Platelets: 155 10*3/uL (ref 150–400)
RBC: 3.65 MIL/uL — ABNORMAL LOW (ref 3.87–5.11)
RDW: 14.1 % (ref 11.5–15.5)
WBC: 5.4 10*3/uL (ref 4.0–10.5)
nRBC: 0 % (ref 0.0–0.2)

## 2020-07-25 LAB — COMPREHENSIVE METABOLIC PANEL
ALT: 21 U/L (ref 0–44)
AST: 26 U/L (ref 15–41)
Albumin: 3.7 g/dL (ref 3.5–5.0)
Alkaline Phosphatase: 73 U/L (ref 38–126)
Anion gap: 8 (ref 5–15)
BUN: 13 mg/dL (ref 8–23)
CO2: 26 mmol/L (ref 22–32)
Calcium: 9.2 mg/dL (ref 8.9–10.3)
Chloride: 103 mmol/L (ref 98–111)
Creatinine, Ser: 0.86 mg/dL (ref 0.44–1.00)
GFR, Estimated: >60 mL/min (ref >60)
Glucose, Bld: 135 mg/dL — ABNORMAL HIGH (ref 70–99)
Potassium: 3.7 mmol/L (ref 3.5–5.1)
Sodium: 137 mmol/L (ref 135–145)
Total Bilirubin: 1.1 mg/dL (ref 0.3–1.2)
Total Protein: 6.5 g/dL (ref 6.5–8.1)

## 2020-07-25 MED ORDER — IRON SUCROSE 20 MG/ML IV SOLN
200.0000 mg | Freq: Once | INTRAVENOUS | Status: AC
  Administered 2020-07-25: 200 mg via INTRAVENOUS
  Filled 2020-07-25: qty 10

## 2020-07-25 MED ORDER — SODIUM CHLORIDE 0.9 % IV SOLN
Freq: Once | INTRAVENOUS | Status: AC
  Filled 2020-07-25: qty 250

## 2020-07-25 NOTE — Assessment & Plan Note (Addendum)
#   Non- functional Small bowel/mesenteric carcinoid; STAGE IV- July 2021-gallium PET scan [compared to January -Overall stable/no evidence of obvious progression; however slight increase in size of the mesenteric mass/liver lesion by few millimeters; also slight increase in SUV uptake. On Lutathera + sandostatin.  #As per patient family had questions about the status of the disease; again reviewed with the patient that she has stage IV malignancy involving the liver/omentum/mesentery.  #Patient status post Lutathera infusion #2 on April 6th.  #3 delayed because of the logistical issues.  HOLD Sandostatin today as #3 [name] is on July 13th.  Proceed with Sandostatin post Lutathera infusion in [place].  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  Gennie Alma [id number]- Iron sat-9%];HOLD IV Venofer.  Hemoglobin 13.3.    # chronic mild diarrhea- ? Sec to carcinoid [not on xarmelo secondary insurance issues-chronic -STABLE.   # DISPOSITION-print July ,2021- PET scan.  # HOLD Sandostatin; Proceed with venofer # Follow up around AUG 10th- ; MD cbc/cmp -Sandstatin; possible venofer;  Dr.B

## 2020-07-25 NOTE — Patient Instructions (Signed)

## 2020-07-25 NOTE — Progress Notes (Signed)
[place] OFFICE PROGRESS NOTE  [name], [name] [name], [name] [name], [name] [name], [name]  Cancer Staging No matching staging information was found for the patient.   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   # FEB 2018- Ga PET- uptake in liver/ mesenteric mass; NOV 2019- increase sandostatin to 44m q monthly.   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  [name].  female pleasant patient above  history of metastatic carcinoid tumor currently on Sandostatin/iron deficient anemia question AV malformation is here for follow-up.  She received infusion #2  on April 6th.  Number 2 infusion was delayed because of logistical reasons.  Patient denies any worsening abdominal pain.  Complains of chronic diarrhea.  Denies any chest pain or shortness of breath or cough.  Review of Systems  Constitutional:  Positive for malaise/fatigue. Negative for chills, diaphoresis, fever and weight loss.  HENT:  Negative for nosebleeds and sore throat.   Eyes:  Negative for double vision.  Respiratory:  Negative for hemoptysis, sputum production, shortness of breath and wheezing.   Cardiovascular:  Negative for chest pain, palpitations and orthopnea.  Gastrointestinal:  Positive for abdominal pain and diarrhea. Negative for constipation, heartburn, melena, nausea and vomiting.  Genitourinary:  Negative for dysuria, frequency and urgency.  Musculoskeletal:  Positive for back pain. Negative for joint pain.  Skin: Negative.  Negative for itching and rash.  Neurological:  Negative for dizziness, tingling, focal weakness, weakness and headaches.  Endo/Heme/Allergies:  Does not bruise/bleed easily.  Psychiatric/Behavioral:  Negative for depression. The patient is not nervous/anxious and does not have insomnia.      PAST MEDICAL HISTORY :  Past Medical History:  Diagnosis Date   Aromatase inhibitor use    Breast cancer ([name] 2014   left breast cancer/radiation   Breast cancer, left breast ([facility] 06/19/2014   CHF (congestive heart failure) (HCC)    recent sepsis   Chronic kidney disease    Dizziness    Dyspnea    recently while admitted   GERD (gastroesophageal reflux disease)    Hearing loss  History of hiatal hernia    History of kidney stones    HOH (hard of hearing)    severe   Hypertension    Hypothyroidism    Leg DVT (deep venous thromboembolism), acute ([place]) 07/2015   left leg after  fracture   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   Pain    chest pain while admitted   Personal history of radiation therapy 2015   left breast ca   Thyroid disease     PAST SURGICAL HISTORY :   Past Surgical History:  Procedure Laterality Date   ABDOMINAL HYSTERECTOMY     partial   BREAST LUMPECTOMY Left 01/13/2013   invasive mammary carcinoma, clear margins, LN negative   BREAST LUMPECTOMY WITH SENTINEL LYMPH NODE BIOPSY Left 2014   CHOLECYSTECTOMY     COLONOSCOPY WITH PROPOFOL N/A 09/17/2017   Procedure: COLONOSCOPY WITH PROPOFOL;  Surgeon: Jonathon Bellows, MD;  Location[facility]COPY;  Service: Gastroenterology;  Laterality: N/A;   CYSTOSCOPY W/ URETERAL STENT PLACEMENT Right 01/07/2016   Procedure: CYSTOSCOPY WITH STENT REPLACEMENT;  Surgeon: Hollice Espy, MD;  Location: [facility] ORS;  Service: Urology;  Laterality: Right;   CYSTOSCOPY WITH RETROGRADE PYELOGRAM, URETEROSCOPY AND STENT PLACEMENT Right 12/19/2015   Procedure: CYSTOSCOPY WITH RETROGRADE PYELOGRAM, URETEROSCOPY AND STENT PLACEMENT;  Surgeon: Ardis Hughs, MD;  Location: [facility] ORS;  Service: Urology;  Laterality: Right;   URETEROSCOPY WITH HOLMIUM LASER LITHOTRIPSY Right 01/07/2016   Procedure: URETEROSCOPY WITH HOLMIUM LASER LITHOTRIPSY;  Surgeon: Hollice Espy, MD;  Location: [facility] ORS;  Service: Urology;  Laterality: Right;    FAMILY HISTORY :   Family History  Problem Relation Age of Onset   Breast cancer Mother 39       deceased 64   Breast cancer Maternal Aunt 42       deceased 92s   Prostate cancer Father        deceased 18   Colon cancer Paternal Aunt 80       deceased 55s    SOCIAL HISTORY:   Social History   Tobacco Use   Smoking status: Never   Smokeless tobacco: Never  Substance Use Topics   Alcohol use: No    Alcohol/week: 0.0 standard drinks   Drug use: No    ALLERGIES:  is allergic to sulfa antibiotics.  MEDICATIONS:  Current Outpatient Medications  Medication Sig Dispense Refill   Calcium  Carb-Cholecalciferol (CALCIUM 600 + D PO) Take 1 tablet by mouth daily.     furosemide (LASIX) 40 MG tablet Take 40 mg by mouth daily as needed for fluid or edema.      hyoscyamine (ANASPAZ) 0.125 MG TBDP disintergrating tablet Place 0.25 mg under the tongue every 4 (four) hours as needed for cramping.     labetalol (NORMODYNE) 200 MG tablet Take 200 mg by mouth 2 (two) times daily.      levothyroxine (SYNTHROID) 88 MCG tablet Take 88 mcg by mouth daily before breakfast.      losartan-hydrochlorothiazide (HYZAAR) 100-25 MG tablet Take 1 tablet by mouth daily.     nystatin (MYCOSTATIN/NYSTOP) powder APPLY TO AFFECTED AREA TWICE A DAY 15 g 3   octreotide (SANDOSTATIN LAR) 30 MG injection Inject 30 mg into the muscle every 28 (twenty-eight) days.      ondansetron (ZOFRAN) 8 MG tablet Take 1 tablet (8 mg total) by mouth 2 (two) times daily as needed for nausea or vomiting. 20 tablet 0   pantoprazole (PROTONIX) 40 MG tablet Take 1 tablet (40 mg total) by  mouth daily. 90 tablet 0   potassium chloride SA (KLOR-CON M20) 20 MEQ tablet TAKE 1 TABLET BY MOUTH EVERY DAY 90 tablet 2   traMADol (ULTRAM) 50 MG tablet 0.5 tablet to 1 tablet every 8 hours as needed for pain 30 tablet 0   Vitamin D, Ergocalciferol, (DRISDOL) 1.25 MG (50000 UNIT) CAPS capsule TAKE 1 CAPSULE BY MOUTH ONE TIME PER WEEK 12 capsule 3   lutetium Lu 177 dotatate (LUTATHERA) 370 MBQ/ML SOLN Inject into the vein.     No current facility-administered medications for this visit.    PHYSICAL EXAMINATION: ECOG PERFORMANCE STATUS: 1 - Symptomatic but completely ambulatory  BP (!) 147/70   Pulse (!) 52   Temp 98.2 F (36.8 C) (Oral)   Resp 16   Wt 223 lb (101.2 kg)   SpO2 96%   BMI 39.50 kg/m   Filed Weights   07/25/20 1315  Weight: 223 lb (101.2 kg)    Physical Exam Constitutional:      Comments: Patient is a wheelchair.  She is alone.  Patient is hard of hearing.  HENT:     Head: Normocephalic and atraumatic.      Mouth/Throat:     Pharynx: No oropharyngeal exudate.  Eyes:     Pupils: Pupils are equal, round, and reactive to light.  Cardiovascular:     Rate and Rhythm: Normal rate and regular rhythm.  Pulmonary:     Effort: Pulmonary effort is normal. No respiratory distress.     Breath sounds: Normal breath sounds. No wheezing.  Abdominal:     General: Bowel sounds are normal. There is no distension.     Palpations: Abdomen is soft. There is no mass.     Tenderness: no abdominal tenderness There is no guarding or rebound.  Musculoskeletal:        General: Swelling present. No tenderness. Normal range of motion.     Cervical back: Normal range of motion and neck supple.  Skin:    General: Skin is warm.  Neurological:     Mental Status: She is alert and oriented to person, place, and time.  Psychiatric:        Mood and Affect: Affect normal.    LABORATORY DATA:  I have reviewed the data as listed    Component Value Date/Time   NA 137 07/25/2020 1253   NA 137 07/12/2013 0841   K 3.7 07/25/2020 1253   K 4.7 07/12/2013 0841   CL 103 07/25/2020 1253   CL 107 07/12/2013 0841   CO2 26 07/25/2020 1253   CO2 20 (L) 07/12/2013 0841   GLUCOSE 135 (H) 07/25/2020 1253   GLUCOSE 122 (H) 07/12/2013 0841   BUN 13 07/25/2020 1253   BUN 18 07/12/2013 0841   CREATININE 0.86 07/25/2020 1253   CREATININE 0.64 05/22/2014 1111   CALCIUM 9.2 07/25/2020 1253   CALCIUM 9.2 07/12/2013 0841   PROT 6.5 07/25/2020 1253   PROT 7.0 05/22/2014 1111   ALBUMIN 3.7 07/25/2020 1253   ALBUMIN 4.2 05/22/2014 1111   AST 26 07/25/2020 1253   AST 29 05/22/2014 1111   ALT 21 07/25/2020 1253   ALT 17 05/22/2014 1111   ALKPHOS 73 07/25/2020 1253   ALKPHOS 95 05/22/2014 1111   BILITOT 1.1 07/25/2020 1253   BILITOT 0.8 05/22/2014 1111   GFRNONAA >60 07/25/2020 1253   GFRNONAA >60 05/22/2014 1111   GFRAA >60 10/07/2019 1319   GFRAA >60 05/22/2014 1111    No results found for: SPEP,  UPEP  Lab Results   Component Value Date   WBC 5.4 07/25/2020   NEUTROABS 3.9 07/25/2020   HGB 11.7 (L) 07/25/2020   HCT 35.0 (L) 07/25/2020   MCV 95.9 07/25/2020   PLT 155 07/25/2020      Chemistry      Component Value Date/Time   NA 137 07/25/2020 1253   NA 137 07/12/2013 0841   K 3.7 07/25/2020 1253   K 4.7 07/12/2013 0841   CL 103 07/25/2020 1253   CL 107 07/12/2013 0841   CO2 26 07/25/2020 1253   CO2 20 (L) 07/12/2013 0841   BUN 13 07/25/2020 1253   BUN 18 07/12/2013 0841   CREATININE 0.86 07/25/2020 1253   CREATININE 0.64 05/22/2014 1111      Component Value Date/Time   CALCIUM 9.2 07/25/2020 1253   CALCIUM 9.2 07/12/2013 0841   ALKPHOS 73 07/25/2020 1253   ALKPHOS 95 05/22/2014 1111   AST 26 07/25/2020 1253   AST 29 05/22/2014 1111   ALT 21 07/25/2020 1253   ALT 17 05/22/2014 1111   BILITOT 1.1 07/25/2020 1253   BILITOT 0.8 05/22/2014 1111       RADIOGRAPHIC STUDIES: I have personally reviewed the radiological images as listed and agreed with the findings in the report. No results found.   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 On Lutathera + sandostatin.  #Patient status post Lutathera infusion #2 approximately a month ago. HOLD Sandostatin today as #3 [name] is on July 13th.   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  Gennie Alma [id number]- Iron sat-9%];HOLD IV Venofer.  Hemoglobin 13.3.    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  # HOLD Sandostatin; Proceed with venofer # Follow up around AUG 10th- ; MD cbc/cmp  -Sandstatin; possible venofer;  Dr.B   No orders of the defined types were placed in this encounter.  All questions were answered. The patient knows to call the clinic with any problems, questions or concerns.      [name], MD 07/25/2020 2:21 PM

## 2020-07-27 ENCOUNTER — Other Ambulatory Visit (HOSPITAL_COMMUNITY): Payer: Self-pay | Admitting: Internal Medicine

## 2020-07-27 DIAGNOSIS — C7B02 Secondary carcinoid tumors of liver: Secondary | ICD-10-CM

## 2020-08-08 ENCOUNTER — Other Ambulatory Visit: Payer: Self-pay

## 2020-08-08 ENCOUNTER — Ambulatory Visit (HOSPITAL_COMMUNITY)
Admission: RE | Admit: 2020-08-08 | Discharge: 2020-08-08 | Disposition: A | Discharge status: Home / Self Care | Payer: Medicare Other | Source: Ambulatory Visit | Attending: Internal Medicine | Admitting: Internal Medicine

## 2020-08-08 DIAGNOSIS — C7A8 Other malignant neuroendocrine tumors: Secondary | ICD-10-CM | POA: Diagnosis present

## 2020-08-08 DIAGNOSIS — Z5111 Encounter for antineoplastic chemotherapy: Secondary | ICD-10-CM | POA: Insufficient documentation

## 2020-08-08 DIAGNOSIS — C7B02 Secondary carcinoid tumors of liver: Secondary | ICD-10-CM

## 2020-08-08 LAB — COMPREHENSIVE METABOLIC PANEL
ALT: 17 U/L (ref 0–44)
AST: 24 U/L (ref 15–41)
Albumin: 3.6 g/dL (ref 3.5–5.0)
Alkaline Phosphatase: 65 U/L (ref 38–126)
Anion gap: 9 (ref 5–15)
BUN: 17 mg/dL (ref 8–23)
CO2: 26 mmol/L (ref 22–32)
Calcium: 9.3 mg/dL (ref 8.9–10.3)
Chloride: 104 mmol/L (ref 98–111)
Creatinine, Ser: 0.66 mg/dL (ref 0.44–1.00)
GFR, Estimated: >60 mL/min (ref >60)
Glucose, Bld: 131 mg/dL — ABNORMAL HIGH (ref 70–99)
Potassium: 3.4 mmol/L — ABNORMAL LOW (ref 3.5–5.1)
Sodium: 139 mmol/L (ref 135–145)
Total Bilirubin: 0.9 mg/dL (ref 0.3–1.2)
Total Protein: 6.4 g/dL — ABNORMAL LOW (ref 6.5–8.1)

## 2020-08-08 LAB — CBC WITH DIFFERENTIAL/PLATELET
Abs Immature Granulocytes: 0.01 10*3/uL (ref 0.00–0.07)
Basophils Absolute: 0 10*3/uL (ref 0.0–0.1)
Basophils Relative: 1 %
Eosinophils Absolute: 0.4 10*3/uL (ref 0.0–0.5)
Eosinophils Relative: 7 %
HCT: 35 % — ABNORMAL LOW (ref 36.0–46.0)
Hemoglobin: 11.6 g/dL — ABNORMAL LOW (ref 12.0–15.0)
Immature Granulocytes: 0 %
Lymphocytes Relative: 8 %
Lymphs Abs: 0.4 10*3/uL — ABNORMAL LOW (ref 0.7–4.0)
MCH: 32.1 pg (ref 26.0–34.0)
MCHC: 33.1 g/dL (ref 30.0–36.0)
MCV: 97 fL (ref 80.0–100.0)
Monocytes Absolute: 0.9 10*3/uL (ref 0.1–1.0)
Monocytes Relative: 16 %
Neutro Abs: 3.8 10*3/uL (ref 1.7–7.7)
Neutrophils Relative %: 68 %
Platelets: 161 10*3/uL (ref 150–400)
RBC: 3.61 MIL/uL — ABNORMAL LOW (ref 3.87–5.11)
RDW: 14.3 % (ref 11.5–15.5)
WBC: 5.5 10*3/uL (ref 4.0–10.5)
nRBC: 0 % (ref 0.0–0.2)

## 2020-08-08 MED ORDER — SODIUM CHLORIDE 0.9 % IV SOLN
500.0000 mL | Freq: Once | INTRAVENOUS | Status: AC
  Administered 2020-08-08: 500 mL via INTRAVENOUS

## 2020-08-08 MED ORDER — LABETALOL HCL 200 MG PO TABS
200.0000 mg | ORAL_TABLET | Freq: Once | ORAL | Status: AC
  Administered 2020-08-08: 200 mg via ORAL
  Filled 2020-08-08: qty 1

## 2020-08-08 MED ORDER — OCTREOTIDE ACETATE 500 MCG/ML IJ SOLN
500.0000 ug | Freq: Once | INTRAMUSCULAR | Status: DC | PRN
  Filled 2020-08-08: qty 1

## 2020-08-08 MED ORDER — LIDOCAINE HCL 1 % IJ SOLN
INTRAMUSCULAR | Status: DC | PRN
  Administered 2020-08-08: 5 mL

## 2020-08-08 MED ORDER — SODIUM CHLORIDE 0.9 % IV SOLN
8.0000 mg | Freq: Once | INTRAVENOUS | Status: AC
  Administered 2020-08-08: 8 mg via INTRAVENOUS
  Filled 2020-08-08: qty 4

## 2020-08-08 MED ORDER — OCTREOTIDE ACETATE 30 MG IM KIT
30.0000 mg | PACK | Freq: Once | INTRAMUSCULAR | Status: AC
  Administered 2020-08-08: 30 mg via INTRAMUSCULAR
  Filled 2020-08-08: qty 1

## 2020-08-08 MED ORDER — PROCHLORPERAZINE EDISYLATE 10 MG/2ML IJ SOLN
10.0000 mg | Freq: Four times a day (QID) | INTRAMUSCULAR | Status: DC | PRN
  Filled 2020-08-08: qty 2

## 2020-08-08 MED ORDER — AMINO ACID RADIOPROTECTANT - L-LYSINE 2.5%/L-ARGININE 2.5% IN NS
250.0000 mL/h | INTRAVENOUS | Status: AC
  Administered 2020-08-08: 250 mL/h via INTRAVENOUS
  Filled 2020-08-08: qty 1000

## 2020-08-08 MED ORDER — OCTREOTIDE ACETATE 30 MG IM KIT
PACK | INTRAMUSCULAR | Status: AC
  Filled 2020-08-08: qty 1

## 2020-08-08 MED ORDER — ONDANSETRON HCL 8 MG PO TABS: 8.0000 mg | ORAL_TABLET | Freq: Two times a day (BID) | ORAL | 0 refills | Status: DC | PRN

## 2020-08-08 MED ORDER — LIDOCAINE HCL 1 % IJ SOLN
INTRAMUSCULAR | Status: AC
  Administered 2020-08-08: 5 mL
  Filled 2020-08-08: qty 20

## 2020-08-08 MED ORDER — LUTETIUM LU 177 DOTATATE 370 MBQ/ML IV SOLN
200.0000 | Freq: Once | INTRAVENOUS | Status: AC
  Administered 2020-08-08: 200.64 via INTRAVENOUS

## 2020-08-08 NOTE — Progress Notes (Signed)
Amino acids complete.  NS bolus started..  Notified IR that PA could come remove PICC.  Pt for Sandostatin injection at 3p.  Tolerated treatment well.  Ordered pt jello and she ate some  without any N/V

## 2020-08-08 NOTE — Progress Notes (Signed)
[  [age] year-old [female] with metastatic neuroendocrine tumor. Well differentiated tumor with somatostatin receptor is identified within the [central mesentery liver] by DOTATATE PET CT scan. EXAM: NUCLEAR MEDICINE LUTATHERA ADMINISTRATION TECHNIQUE: Infusion: The nuclear medicine technologist and I personally verified the dose activity ([203] mCi) to be delivered as specified in the written directive (200 mCi), and verified the patient identification via 2 separate methods.  Temporary PICC line was inserted (prior difficult venous access). Anti-emetics were administered by nursing staff. Amino acid renal protection was initiated 30 minutes prior to Lu 177 DOTATATE (Lutathera) infusion and continued continuously for 4 hours. Lutathera infusion was administered over 30 minutes.    The total administered dose was [200.6] mCi Lu 177 DOTATATE.   The entire IV tubing, venocatheter, stopcock and syringes was removed in total, placed in a disposal bag and sent for assay of the residual activity, which will be reported at a later time in our EMR by the physics staff. Pressure was applied to the venipuncture sites, and a compression bandage placed. Radiation Safety personnel were present to perform the discharge survey, as detailed on their documentation. Temporary PICC line removed.   Patient received 30 mg IM long-acting Sandostatin injection 4 hours after Lutathera effusion in the nuclear medicine department. RADIOPHARMACEUTICALS:   [200.6] mCi Lu 177 DOTATATE FINDINGS: Diagnosis: [Metastatic neuroendocrine tumor.]   Current Infusion: [3]   Planned Infusions: [4]   Patient reports [minimal] interval symptoms following therapy. The patient's most recent blood counts were reviewed and remains a good candidate to proceed with Lutathera.  Mild reduction in hemoglobin.  Platelets and white blood cell count normal.  Normal renal function and liver function.    Temporary PICC line was placed.   Prior difficult venous access.  The patient was situated in an infusion suite and administered Lutathera as above. Patient will follow-up with referring oncologist for interval serum laboratories (CBC and CMP) in approximately 4 weeks.     Patient received 30 mg IM long-acting Sandostatin injection 4 hours after Lutathera effusion in the nuclear medicine department.   IMPRESSION: [Third] FY 924 [id number] treatment for metastatic neuroendocrine tumor. The patient tolerated the infusion well. The patient will return in 8 weeks for ongoing care.

## 2020-08-08 NOTE — Progress Notes (Signed)
Patient presents for Lutathera treatment.  Upon checking VS, pt's BP noted to be 194/97. Pt states that she doesn't think she took her BP medications today. Dr. Leonia Reeves made aware and pt's morning BP medication of 200mg  Labetalol was ordered. Pt denies any symptoms, and pt is a/o x 4. Pt is hard of hearing.

## 2020-08-08 NOTE — Progress Notes (Signed)
Patient tolerated Lutathera infusion without incident.  After infusion, the lumen in which the lutathera was infused in was flushed with 9ml of NaCl.  No signs of infiltration noted. BP has improved since taking labetalol. Patient remains symptom free and comfortable. Will continue to monitor patient.

## 2020-08-08 NOTE — Procedures (Signed)
Interventional Radiology Procedure Note  Procedure: Right upper extremity PICC placement  Indication: Malignant carcinoid tumor  Findings: PICC ready for use.  Catheter tip is in the superior right atrium. Please refer to procedural dictation for full description.  Complications: None  EBL: < 10 mL  Miachel Roux, MD [phone]

## 2020-08-08 NOTE — Progress Notes (Signed)
Assumed care from R[place],

## 2020-08-09 ENCOUNTER — Other Ambulatory Visit (HOSPITAL_COMMUNITY): Payer: Self-pay | Admitting: Diagnostic Radiology

## 2020-08-09 DIAGNOSIS — C7B02 Secondary carcinoid tumors of liver: Secondary | ICD-10-CM

## 2020-08-22 ENCOUNTER — Ambulatory Visit (HOSPITAL_COMMUNITY): Payer: Medicare Other

## 2020-09-04 ENCOUNTER — Other Ambulatory Visit: Payer: Self-pay

## 2020-09-04 DIAGNOSIS — C7B02 Secondary carcinoid tumors of liver: Secondary | ICD-10-CM

## 2020-09-05 ENCOUNTER — Encounter: Payer: Self-pay | Admitting: Internal Medicine

## 2020-09-05 ENCOUNTER — Other Ambulatory Visit: Payer: Self-pay

## 2020-09-05 ENCOUNTER — Inpatient Hospital Stay (HOSPITAL_BASED_OUTPATIENT_CLINIC_OR_DEPARTMENT_OTHER): Payer: Medicare Other | Admitting: Internal Medicine

## 2020-09-05 ENCOUNTER — Inpatient Hospital Stay: Payer: Medicare Other | Attending: Internal Medicine

## 2020-09-05 ENCOUNTER — Inpatient Hospital Stay: Payer: Medicare Other

## 2020-09-05 DIAGNOSIS — K6389 Other specified diseases of intestine: Secondary | ICD-10-CM

## 2020-09-05 DIAGNOSIS — C7A019 Malignant carcinoid tumor of the small intestine, unspecified portion: Secondary | ICD-10-CM | POA: Insufficient documentation

## 2020-09-05 DIAGNOSIS — E340 Carcinoid syndrome: Secondary | ICD-10-CM | POA: Diagnosis not present

## 2020-09-05 DIAGNOSIS — D509 Iron deficiency anemia, unspecified: Secondary | ICD-10-CM | POA: Insufficient documentation

## 2020-09-05 DIAGNOSIS — C7B02 Secondary carcinoid tumors of liver: Secondary | ICD-10-CM | POA: Insufficient documentation

## 2020-09-05 LAB — CBC WITH DIFFERENTIAL/PLATELET
Abs Immature Granulocytes: 0.01 10*3/uL (ref 0.00–0.07)
Basophils Absolute: 0 10*3/uL (ref 0.0–0.1)
Basophils Relative: 1 %
Eosinophils Absolute: 0.3 10*3/uL (ref 0.0–0.5)
Eosinophils Relative: 6 %
HCT: 34.7 % — ABNORMAL LOW (ref 36.0–46.0)
Hemoglobin: 11.7 g/dL — ABNORMAL LOW (ref 12.0–15.0)
Immature Granulocytes: 0 %
Lymphocytes Relative: 4 %
Lymphs Abs: 0.2 10*3/uL — ABNORMAL LOW (ref 0.7–4.0)
MCH: 32.5 pg (ref 26.0–34.0)
MCHC: 33.7 g/dL (ref 30.0–36.0)
MCV: 96.4 fL (ref 80.0–100.0)
Monocytes Absolute: 1 10*3/uL (ref 0.1–1.0)
Monocytes Relative: 19 %
Neutro Abs: 3.7 10*3/uL (ref 1.7–7.7)
Neutrophils Relative %: 70 %
Platelets: 148 10*3/uL — ABNORMAL LOW (ref 150–400)
RBC: 3.6 MIL/uL — ABNORMAL LOW (ref 3.87–5.11)
RDW: 14.2 % (ref 11.5–15.5)
WBC: 5.2 10*3/uL (ref 4.0–10.5)
nRBC: 0 % (ref 0.0–0.2)

## 2020-09-05 LAB — COMPREHENSIVE METABOLIC PANEL
ALT: 17 U/L (ref 0–44)
AST: 24 U/L (ref 15–41)
Albumin: 3.9 g/dL (ref 3.5–5.0)
Alkaline Phosphatase: 63 U/L (ref 38–126)
Anion gap: 6 (ref 5–15)
BUN: 15 mg/dL (ref 8–23)
CO2: 29 mmol/L (ref 22–32)
Calcium: 8.9 mg/dL (ref 8.9–10.3)
Chloride: 100 mmol/L (ref 98–111)
Creatinine, Ser: 0.78 mg/dL (ref 0.44–1.00)
GFR, Estimated: >60 mL/min (ref >60)
Glucose, Bld: 134 mg/dL — ABNORMAL HIGH (ref 70–99)
Potassium: 3.5 mmol/L (ref 3.5–5.1)
Sodium: 135 mmol/L (ref 135–145)
Total Bilirubin: 1 mg/dL (ref 0.3–1.2)
Total Protein: 6.6 g/dL (ref 6.5–8.1)

## 2020-09-05 MED ORDER — SODIUM CHLORIDE 0.9 % IV SOLN
Freq: Once | INTRAVENOUS | Status: AC
  Filled 2020-09-05: qty 250

## 2020-09-05 MED ORDER — OCTREOTIDE ACETATE 30 MG IM KIT
30.0000 mg | PACK | Freq: Once | INTRAMUSCULAR | Status: AC
Start: 2020-09-05 — End: 2020-09-05
  Administered 2020-09-05: 30 mg via INTRAMUSCULAR
  Filled 2020-09-05: qty 1

## 2020-09-05 MED ORDER — IRON SUCROSE 20 MG/ML IV SOLN
200.0000 mg | Freq: Once | INTRAVENOUS | Status: AC
Start: 2020-09-05 — End: 2020-09-05
  Administered 2020-09-05: 200 mg via INTRAVENOUS
  Filled 2020-09-05: qty 10

## 2020-09-05 NOTE — Assessment & Plan Note (Addendum)
#   Non- functional Small bowel/mesenteric carcinoid; STAGE IV- July 2021-gallium PET scan [compared to January -Overall stable/no evidence of obvious progression; however slight increase in size of the mesenteric mass/liver lesion by few millimeters; also slight increase in SUV uptake. On Lutathera + sandostatin. STABLE.   #Patient status post Lutathera infusion #3 on july13th. Proceed with Sandostatin post Lutathera infusion in [place].  Proceed with Sandostatin today.  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  JAN 2022- Iron sat-19%]; Proceed with Venofer.  Hemoglobin 11.6  # chronic mild diarrhea- ? 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   # Hoarseness of voice-declines ENT referral.   # Poor IV access: PICC line [[place]]  # DISPOSITION- #  Sandostatin; Proceed with venofer # Follow up in 2 months MD cbc/cmp/chromogranin- -Sandstatin; possible venofer;  Dr.B

## 2020-09-05 NOTE — Progress Notes (Signed)
[place] OFFICE PROGRESS NOTE  [name], [name] [name], [name] [name], [name] [name], [name]  Cancer Staging No matching staging information was found for the patient.   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   # FEB 2018- Ga PET- uptake in liver/ mesenteric mass; NOV 2019- increase sandostatin to 52m q monthly.   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  [name] pleasant patient above  history of metastatic carcinoid tumor currently on Sandostatin/iron deficient anemia question AV malformation is here for follow-up.  She received infusion #3  on July 13th.  Patient tolerated well.  Mild nausea/fatigue.  Otherwise not any worse.  Patient needed PICC line for IV access; taken out.    Denies any worsening abdominal pain chronic mild diarrhea.  No blood in stools or black-colored stools.  Review of Systems  Constitutional:  Positive for malaise/fatigue. Negative for chills, diaphoresis, fever and weight loss.  HENT:  Negative for nosebleeds and sore throat.   Eyes:  Negative for double vision.  Respiratory:  Negative for hemoptysis, sputum production, shortness of breath and wheezing.   Cardiovascular:  Negative for chest pain, palpitations and orthopnea.  Gastrointestinal:  Positive for abdominal pain and diarrhea. Negative for constipation, heartburn, melena, nausea and vomiting.  Genitourinary:  Negative for dysuria, frequency and urgency.  Musculoskeletal:  Positive for back pain. Negative for joint pain.  Skin: Negative.  Negative for itching and rash.  Neurological:  Negative for dizziness, tingling, focal weakness, weakness and headaches.  Endo/Heme/Allergies:  Does not bruise/bleed easily.  Psychiatric/Behavioral:  Negative for depression. The patient is not nervous/anxious and does not have insomnia.      PAST MEDICAL HISTORY :  Past Medical History:  Diagnosis Date   Aromatase inhibitor use    [place] [place]   left breast cancer/radiation   [facility] [place] [facility] [place]   CHF (congestive heart failure) (HCC)    recent sepsis   Chronic kidney disease    Dizziness    Dyspnea    recently while admitted   GERD (  gastroesophageal reflux disease)    Hearing loss    History of hiatal hernia    History of kidney stones    HOH (hard of hearing)    severe   Hypertension    Hypothyroidism    Leg DVT (deep venous thromboembolism), acute  ([place]) 07/2015   left leg after fracture   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   Pain    chest pain while admitted   Personal history of radiation therapy 2015   left breast ca   Thyroid disease     PAST SURGICAL HISTORY :   Past Surgical History:  Procedure Laterality Date   ABDOMINAL HYSTERECTOMY     partial   BREAST LUMPECTOMY Left 01/13/2013   invasive mammary carcinoma, clear margins, LN negative   BREAST LUMPECTOMY WITH SENTINEL LYMPH NODE BIOPSY Left 2014   CHOLECYSTECTOMY     COLONOSCOPY WITH PROPOFOL N/A 09/17/2017   Procedure: COLONOSCOPY WITH PROPOFOL;  Surgeon: Jonathon Bellows, MD;  Location: [facility] ENDOSCOPY;  Service: Gastroenterology;  Laterality: N/A;   CYSTOSCOPY W/ URETERAL STENT PLACEMENT Right 01/07/2016   Procedure: CYSTOSCOPY WITH STENT REPLACEMENT;  Surgeon: Hollice Espy, MD;  Location: [facility] ORS;  Service: Urology;  Laterality: Right;   CYSTOSCOPY WITH RETROGRADE PYELOGRAM, URETEROSCOPY AND STENT PLACEMENT Right 12/19/2015   Procedure: CYSTOSCOPY WITH RETROGRADE PYELOGRAM, URETEROSCOPY AND STENT PLACEMENT;  Surgeon: Ardis Hughs, MD;  Location: [facility] ORS;  Service: Urology;  Laterality: Right;   URETEROSCOPY WITH HOLMIUM LASER LITHOTRIPSY Right 01/07/2016   Procedure: URETEROSCOPY WITH HOLMIUM LASER LITHOTRIPSY;  Surgeon: Hollice Espy, MD;  Location: [facility] ORS;  Service: Urology;  Laterality: Right;    FAMILY HISTORY :   Family History  Problem Relation Age of Onset   Breast cancer Mother 98       deceased 83   Breast cancer Maternal Aunt 93       deceased 45s   Prostate cancer Father        deceased 26   Colon cancer Paternal Aunt 80       deceased 49s    SOCIAL HISTORY:   Social History   Tobacco Use   Smoking status: Never   Smokeless tobacco: Never  Substance Use Topics   Alcohol use: No    Alcohol/week: 0.0 standard drinks   Drug use: No    ALLERGIES:  is allergic to sulfa antibiotics.  MEDICATIONS:  Current Outpatient Medications   Medication Sig Dispense Refill   Calcium Carb-Cholecalciferol (CALCIUM 600 + D PO) Take 1 tablet by mouth daily.     furosemide (LASIX) 40 MG tablet Take 40 mg by mouth daily as needed for fluid or edema.      hyoscyamine (ANASPAZ) 0.125 MG TBDP disintergrating tablet Place 0.25 mg under the tongue every 4 (four) hours as needed for cramping.     labetalol (NORMODYNE) 200 MG tablet Take 200 mg by mouth 2 (two) times daily.      levothyroxine (SYNTHROID) 88 MCG tablet Take 88 mcg by mouth daily before breakfast.      losartan-hydrochlorothiazide (HYZAAR) 100-25 MG tablet Take 1 tablet by mouth daily.     lutetium Lu 177 dotatate (LUTATHERA) 370 MBQ/ML SOLN Inject into the vein.     nystatin (MYCOSTATIN/NYSTOP) powder APPLY TO AFFECTED AREA TWICE A DAY 15 g 3   octreotide (SANDOSTATIN LAR) 30 MG injection Inject 30 mg into the muscle every 28 (twenty-eight) days.      ondansetron (ZOFRAN) 8 MG tablet Take 1 tablet (8 mg total) by mouth  2 (two) times daily as needed for nausea or vomiting. 20 tablet 0   pantoprazole (PROTONIX) 40 MG tablet Take 1 tablet (40 mg total) by mouth daily. 90 tablet 0   potassium chloride SA (KLOR-CON M20) 20 MEQ tablet TAKE 1 TABLET BY MOUTH EVERY DAY 90 tablet 2   traMADol (ULTRAM) 50 MG tablet 0.5 tablet to 1 tablet every 8 hours as needed for pain 30 tablet 0   Vitamin D, Ergocalciferol, (DRISDOL) 1.25 MG (50000 UNIT) CAPS capsule TAKE 1 CAPSULE BY MOUTH ONE TIME PER WEEK 12 capsule 3   No current facility-administered medications for this visit.    PHYSICAL EXAMINATION: ECOG PERFORMANCE STATUS: 1 - Symptomatic but completely ambulatory  BP (!) 143/60   Pulse (!) 54   Temp 97.9 F (36.6 C) (Tympanic)   Resp 20   There were no vitals filed for this visit.   Physical Exam Constitutional:      Comments: Patient is a wheelchair.  She is alone.  Patient is hard of hearing.  HENT:     Head: Normocephalic and atraumatic.     Mouth/Throat:     Pharynx: No  oropharyngeal exudate.  Eyes:     Pupils: Pupils are equal, round, and reactive to light.  Cardiovascular:     Rate and Rhythm: Normal rate and regular rhythm.  Pulmonary:     Effort: Pulmonary effort is normal. No respiratory distress.     Breath sounds: Normal breath sounds. No wheezing.  Abdominal:     General: Bowel sounds are normal. There is no distension.     Palpations: Abdomen is soft. There is no mass.     Tenderness: no abdominal tenderness There is no guarding or rebound.  Musculoskeletal:        General: Swelling present. No tenderness. Normal range of motion.     Cervical back: Normal range of motion and neck supple.  Skin:    General: Skin is warm.  Neurological:     Mental Status: She is alert and oriented to person, place, and time.  Psychiatric:        Mood and Affect: Affect normal.    LABORATORY DATA:  I have reviewed the data as listed    Component Value Date/Time   NA 135 09/05/2020 1304   NA 137 07/12/2013 0841   K 3.5 09/05/2020 1304   K 4.7 07/12/2013 0841   CL 100 09/05/2020 1304   CL 107 07/12/2013 0841   CO2 29 09/05/2020 1304   CO2 20 (L) 07/12/2013 0841   GLUCOSE 134 (H) 09/05/2020 1304   GLUCOSE 122 (H) 07/12/2013 0841   BUN 15 09/05/2020 1304   BUN 18 07/12/2013 0841   CREATININE 0.78 09/05/2020 1304   CREATININE 0.64 05/22/2014 1111   CALCIUM 8.9 09/05/2020 1304   CALCIUM 9.2 07/12/2013 0841   PROT 6.6 09/05/2020 1304   PROT 7.0 05/22/2014 1111   ALBUMIN 3.9 09/05/2020 1304   ALBUMIN 4.2 05/22/2014 1111   AST 24 09/05/2020 1304   AST 29 05/22/2014 1111   ALT 17 09/05/2020 1304   ALT 17 05/22/2014 1111   ALKPHOS 63 09/05/2020 1304   ALKPHOS 95 05/22/2014 1111   BILITOT 1.0 09/05/2020 1304   BILITOT 0.8 05/22/2014 1111   GFRNONAA >60 09/05/2020 1304   GFRNONAA >60 05/22/2014 1111   GFRAA >60 10/07/2019 1319   GFRAA >60 05/22/2014 1111    No results found for: SPEP, UPEP  Lab Results  Component Value Date  WBC 5.5  08/08/2020   NEUTROABS 3.8 08/08/2020   HGB 11.6 (L) 08/08/2020   HCT 35.0 (L) 08/08/2020   MCV 97.0 08/08/2020   PLT 161 08/08/2020      Chemistry      Component Value Date/Time   NA 135 09/05/2020 1304   NA 137 07/12/2013 0841   K 3.5 09/05/2020 1304   K 4.7 07/12/2013 0841   CL 100 09/05/2020 1304   CL 107 07/12/2013 0841   CO2 29 09/05/2020 1304   CO2 20 (L) 07/12/2013 0841   BUN 15 09/05/2020 1304   BUN 18 07/12/2013 0841   CREATININE 0.78 09/05/2020 1304   CREATININE 0.64 05/22/2014 1111      Component Value Date/Time   CALCIUM 8.9 09/05/2020 1304   CALCIUM 9.2 07/12/2013 0841   ALKPHOS 63 09/05/2020 1304   ALKPHOS 95 05/22/2014 1111   AST 24 09/05/2020 1304   AST 29 05/22/2014 1111   ALT 17 09/05/2020 1304   ALT 17 05/22/2014 1111   BILITOT 1.0 09/05/2020 1304   BILITOT 0.8 05/22/2014 1111       RADIOGRAPHIC STUDIES: I have personally reviewed the radiological images as listed and agreed with the findings in the report. No results found.   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 On Lutathera + sandostatin. STABLE.   #Patient status post Lutathera infusion #3 on july13th. Proceed with Sandostatin post Lutathera infusion in [place].  Proceed with Sandostatin today.  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  JAN 2022- Iron sat-19%]; Proceed with Venofer.  Hemoglobin 11.6  # chronic mild diarrhea- ? 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   # Hoarseness of voice-declines ENT referral.   # Poor IV access: PICC line [[place]]  # DISPOSITION- #  Sandostatin; Proceed with venofer # Follow up in 2 months MD  cbc/cmp/chromogranin- -Sandstatin; possible venofer;  Dr.B   No orders of the defined types were placed in this encounter.  All questions were answered. The patient knows to call the clinic with any problems, questions or concerns.      [name], MD 09/05/2020 2:21 PM

## 2020-09-05 NOTE — Patient Instructions (Signed)
[place] ONCOLOGY  Discharge Instructions: Thank you for choosing [facility] to provide your oncology and hematology care.  If you have a lab appointment with the [facility], please go directly to the [facility] and check in at the registration area.  Wear comfortable clothing and clothing appropriate for easy access to any Portacath or PICC line.   We strive to give you quality time with your provider. You may need to reschedule your appointment if you arrive late (15 or more minutes).  Arriving late affects you and other patients whose appointments are after yours.  Also, if you miss three or more appointments without notifying the office, you may be dismissed from the clinic at the provider's discretion.      For prescription refill requests, have your pharmacy contact our office and allow 72 hours for refills to be completed.    Today you received the following : Venofer / Sandostatin   To help prevent nausea and vomiting after your treatment, we encourage you to take your nausea medication as directed.  BELOW ARE SYMPTOMS THAT SHOULD BE REPORTED IMMEDIATELY: *FEVER GREATER THAN 100.4 F (38 C) OR HIGHER *CHILLS OR SWEATING *NAUSEA AND VOMITING THAT IS NOT CONTROLLED WITH YOUR NAUSEA MEDICATION *UNUSUAL SHORTNESS OF BREATH *UNUSUAL BRUISING OR BLEEDING *URINARY PROBLEMS (pain or burning when urinating, or frequent urination) *BOWEL PROBLEMS (unusual diarrhea, constipation, pain near the anus) TENDERNESS IN MOUTH AND THROAT WITH OR WITHOUT PRESENCE OF ULCERS (sore throat, sores in mouth, or a toothache) UNUSUAL RASH, SWELLING OR PAIN  UNUSUAL VAGINAL DISCHARGE OR ITCHING   Items with * indicate a potential emergency and should be followed up as soon as possible or go to the Emergency Department if any problems should occur.  Please show the CHEMOTHERAPY ALERT CARD or IMMUNOTHERAPY ALERT CARD at check-in to the Emergency Department and  triage nurse.  Should you have questions after your visit or need to cancel or reschedule your appointment, please contact [facility]  [phone] and follow the prompts.  Office hours are 8:00 a.m. to 4:30 p.m. Monday - Friday. Please note that voicemails left after 4:00 p.m. may not be returned until the following business day.  We are closed weekends and major holidays. You have access to a nurse at all times for urgent questions. Please call the main number to the clinic [phone] and follow the prompts.  For any non-urgent questions, you may also contact your provider using MyChart. We now offer e-Visits for anyone 79 and older to request care online for non-urgent symptoms. For details visit [email or web address].   Also download the MyChart app! Go to the app store, search "MyChart", open the app, select [facility], and log in with your MyChart username and password.  Due to Covid, a mask is required upon entering the hospital/clinic. If you do not have a mask, one will be given to you upon arrival. For doctor visits, patients may have 1 support person aged 79 or older with them. For treatment visits, patients cannot have anyone with them due to current Covid guidelines and our immunocompromised population.

## 2020-09-17 ENCOUNTER — Other Ambulatory Visit (HOSPITAL_COMMUNITY): Payer: Self-pay | Admitting: Internal Medicine

## 2020-09-17 DIAGNOSIS — C7B02 Secondary carcinoid tumors of liver: Secondary | ICD-10-CM

## 2020-09-28 NOTE — Written Directive (Addendum)
MOLECULAR IMAGING AND THERAPEUTICS WRITTEN DIRECTIVE   PATIENT NAME: Emma [name]  PT DOB:   03/21/1940                                              MRN: [id number]  ---------------------------------------------------------------------------------------------------------------------  [name] THERAPY   RADIOPHARMACEUTICAL:  Lutetium 177 Dotatate (Lutathera)     PRESCRIBED DOSE FOR ADMINISTRATION:  200 mCi   ROUTE OFADMINISTRATION:  IV   DIAGNOSIS:  Metastatic Malignant Carcinoid Tumor to Liver   REFERRING PHYSICIAN:  Brahmanday   TREATMENT #: 4   DATE OF LAST LONG LIVED SOMATOSTATIN INJECTION:  07/2020   ADDITIONAL PHYSICIAN COMMENTS/NOTES:   AUTHORIZED USER SIGNATURE & TIME STAMP: Rennis Golden, MD   09/28/20    8:17 AM

## 2020-10-03 ENCOUNTER — Ambulatory Visit (HOSPITAL_COMMUNITY)
Admission: RE | Admit: 2020-10-03 | Discharge: 2020-10-03 | Disposition: A | Discharge status: Home / Self Care | Payer: Medicare Other | Source: Ambulatory Visit | Attending: Internal Medicine | Admitting: Internal Medicine

## 2020-10-03 ENCOUNTER — Ambulatory Visit (HOSPITAL_COMMUNITY)
Admission: RE | Admit: 2020-10-03 | Discharge: 2020-10-03 | Disposition: A | Discharge status: Home / Self Care | Payer: Medicare Other | Source: Ambulatory Visit | Attending: Diagnostic Radiology | Admitting: Diagnostic Radiology

## 2020-10-03 ENCOUNTER — Other Ambulatory Visit: Payer: Self-pay

## 2020-10-03 ENCOUNTER — Other Ambulatory Visit (HOSPITAL_COMMUNITY): Payer: Self-pay | Admitting: Diagnostic Radiology

## 2020-10-03 DIAGNOSIS — C7A8 Other malignant neuroendocrine tumors: Secondary | ICD-10-CM | POA: Diagnosis not present

## 2020-10-03 DIAGNOSIS — C7B02 Secondary carcinoid tumors of liver: Secondary | ICD-10-CM

## 2020-10-03 DIAGNOSIS — Z452 Encounter for adjustment and management of vascular access device: Secondary | ICD-10-CM | POA: Insufficient documentation

## 2020-10-03 LAB — CBC WITH DIFFERENTIAL/PLATELET
Abs Immature Granulocytes: 0.02 10*3/uL (ref 0.00–0.07)
Basophils Absolute: 0.1 10*3/uL (ref 0.0–0.1)
Basophils Relative: 1 %
Eosinophils Absolute: 0.5 10*3/uL (ref 0.0–0.5)
Eosinophils Relative: 10 %
HCT: 34 % — ABNORMAL LOW (ref 36.0–46.0)
Hemoglobin: 11.6 g/dL — ABNORMAL LOW (ref 12.0–15.0)
Immature Granulocytes: 0 %
Lymphocytes Relative: 6 %
Lymphs Abs: 0.3 10*3/uL — ABNORMAL LOW (ref 0.7–4.0)
MCH: 33.4 pg (ref 26.0–34.0)
MCHC: 34.1 g/dL (ref 30.0–36.0)
MCV: 98 fL (ref 80.0–100.0)
Monocytes Absolute: 0.8 10*3/uL (ref 0.1–1.0)
Monocytes Relative: 16 %
Neutro Abs: 3.3 10*3/uL (ref 1.7–7.7)
Neutrophils Relative %: 67 %
Platelets: 145 10*3/uL — ABNORMAL LOW (ref 150–400)
RBC: 3.47 MIL/uL — ABNORMAL LOW (ref 3.87–5.11)
RDW: 14.6 % (ref 11.5–15.5)
WBC: 4.9 10*3/uL (ref 4.0–10.5)
nRBC: 0 % (ref 0.0–0.2)

## 2020-10-03 LAB — COMPREHENSIVE METABOLIC PANEL
ALT: 18 U/L (ref 0–44)
AST: 24 U/L (ref 15–41)
Albumin: 3.6 g/dL (ref 3.5–5.0)
Alkaline Phosphatase: 74 U/L (ref 38–126)
Anion gap: 11 (ref 5–15)
BUN: 17 mg/dL (ref 8–23)
CO2: 24 mmol/L (ref 22–32)
Calcium: 9 mg/dL (ref 8.9–10.3)
Chloride: 103 mmol/L (ref 98–111)
Creatinine, Ser: 0.75 mg/dL (ref 0.44–1.00)
GFR, Estimated: >60 mL/min (ref >60)
Glucose, Bld: 136 mg/dL — ABNORMAL HIGH (ref 70–99)
Potassium: 3.2 mmol/L — ABNORMAL LOW (ref 3.5–5.1)
Sodium: 138 mmol/L (ref 135–145)
Total Bilirubin: 0.9 mg/dL (ref 0.3–1.2)
Total Protein: 6.4 g/dL — ABNORMAL LOW (ref 6.5–8.1)

## 2020-10-03 MED ORDER — SODIUM CHLORIDE 0.9 % IV SOLN
8.0000 mg | Freq: Once | INTRAVENOUS | Status: AC
  Administered 2020-10-03: 8 mg via INTRAVENOUS
  Filled 2020-10-03: qty 4

## 2020-10-03 MED ORDER — SODIUM CHLORIDE 0.9 % IV SOLN: 500.0000 mL | Freq: Once | INTRAVENOUS | Status: DC

## 2020-10-03 MED ORDER — AMINO ACID RADIOPROTECTANT - L-LYSINE 2.5%/L-ARGININE 2.5% IN NS
250.0000 mL/h | INTRAVENOUS | Status: DC
  Administered 2020-10-03: 250 mL/h via INTRAVENOUS
  Filled 2020-10-03: qty 1000

## 2020-10-03 MED ORDER — OCTREOTIDE ACETATE 30 MG IM KIT
30.0000 mg | PACK | Freq: Once | INTRAMUSCULAR | Status: AC
  Administered 2020-10-03: 30 mg via INTRAMUSCULAR

## 2020-10-03 MED ORDER — LUTETIUM LU 177 DOTATATE 370 MBQ/ML IV SOLN
200.0000 | Freq: Once | INTRAVENOUS | Status: AC
  Administered 2020-10-03: 198.4 via INTRAVENOUS

## 2020-10-03 MED ORDER — OCTREOTIDE ACETATE 500 MCG/ML IJ SOLN
INTRAMUSCULAR | Status: AC
  Filled 2020-10-03: qty 1

## 2020-10-03 MED ORDER — OCTREOTIDE ACETATE 30 MG IM KIT
PACK | INTRAMUSCULAR | Status: AC
  Filled 2020-10-03: qty 1

## 2020-10-03 MED ORDER — PROCHLORPERAZINE EDISYLATE 10 MG/2ML IJ SOLN: 10.0000 mg | Freq: Four times a day (QID) | INTRAMUSCULAR | Status: DC | PRN

## 2020-10-03 MED ORDER — LIDOCAINE HCL 1 % IJ SOLN
INTRAMUSCULAR | Status: AC
  Administered 2020-10-03: 1 mL via SUBCUTANEOUS
  Filled 2020-10-03: qty 20

## 2020-10-03 MED ORDER — OCTREOTIDE ACETATE 500 MCG/ML IJ SOLN: 500.0000 ug | Freq: Once | INTRAMUSCULAR | Status: DC | PRN

## 2020-10-03 NOTE — Procedures (Signed)
PROCEDURE SUMMARY:  Successful placement of image-guided double lumen PICC line to the right basilic vein. Length 41 cm. Tip at lower SVC/RA. No complications. EBL = <3 ml. Ready for use.  Please see imaging section of Epic for full dictation.   Theresa Duty, NP 10/03/2020 9:14 AM

## 2020-10-03 NOTE — Progress Notes (Signed)
The patient tolerated her 4th dose of lutathera without complication nor distress. Vitals remained stable. PICC line d/c according to policy. Daughter remained at bedside during post procedure process.

## 2020-11-08 ENCOUNTER — Other Ambulatory Visit: Payer: Self-pay | Admitting: *Deleted

## 2020-11-08 DIAGNOSIS — K6389 Other specified diseases of intestine: Secondary | ICD-10-CM

## 2020-11-08 DIAGNOSIS — C7B02 Secondary carcinoid tumors of liver: Secondary | ICD-10-CM

## 2020-11-12 ENCOUNTER — Other Ambulatory Visit: Payer: Self-pay

## 2020-11-12 ENCOUNTER — Inpatient Hospital Stay: Payer: Medicare Other | Attending: Internal Medicine

## 2020-11-12 ENCOUNTER — Inpatient Hospital Stay: Payer: Medicare Other

## 2020-11-12 ENCOUNTER — Inpatient Hospital Stay (HOSPITAL_BASED_OUTPATIENT_CLINIC_OR_DEPARTMENT_OTHER): Payer: Medicare Other | Admitting: Internal Medicine

## 2020-11-12 DIAGNOSIS — C7B02 Secondary carcinoid tumors of liver: Secondary | ICD-10-CM | POA: Insufficient documentation

## 2020-11-12 DIAGNOSIS — R197 Diarrhea, unspecified: Secondary | ICD-10-CM | POA: Insufficient documentation

## 2020-11-12 DIAGNOSIS — E340 Carcinoid syndrome: Secondary | ICD-10-CM | POA: Diagnosis not present

## 2020-11-12 DIAGNOSIS — K6389 Other specified diseases of intestine: Secondary | ICD-10-CM

## 2020-11-12 DIAGNOSIS — D509 Iron deficiency anemia, unspecified: Secondary | ICD-10-CM | POA: Diagnosis not present

## 2020-11-12 DIAGNOSIS — C7A019 Malignant carcinoid tumor of the small intestine, unspecified portion: Secondary | ICD-10-CM | POA: Diagnosis not present

## 2020-11-12 LAB — COMPREHENSIVE METABOLIC PANEL
ALT: 18 U/L (ref 0–44)
AST: 22 U/L (ref 15–41)
Albumin: 3.8 g/dL (ref 3.5–5.0)
Alkaline Phosphatase: 65 U/L (ref 38–126)
Anion gap: 8 (ref 5–15)
BUN: 19 mg/dL (ref 8–23)
CO2: 26 mmol/L (ref 22–32)
Calcium: 9 mg/dL (ref 8.9–10.3)
Chloride: 104 mmol/L (ref 98–111)
Creatinine, Ser: 0.81 mg/dL (ref 0.44–1.00)
GFR, Estimated: >60 mL/min (ref >60)
Glucose, Bld: 133 mg/dL — ABNORMAL HIGH (ref 70–99)
Potassium: 3.7 mmol/L (ref 3.5–5.1)
Sodium: 138 mmol/L (ref 135–145)
Total Bilirubin: 0.8 mg/dL (ref 0.3–1.2)
Total Protein: 7 g/dL (ref 6.5–8.1)

## 2020-11-12 LAB — CBC WITH DIFFERENTIAL/PLATELET
Abs Immature Granulocytes: 0.03 10*3/uL (ref 0.00–0.07)
Basophils Absolute: 0.1 10*3/uL (ref 0.0–0.1)
Basophils Relative: 1 %
Eosinophils Absolute: 0.5 10*3/uL (ref 0.0–0.5)
Eosinophils Relative: 9 %
HCT: 32.8 % — ABNORMAL LOW (ref 36.0–46.0)
Hemoglobin: 11.4 g/dL — ABNORMAL LOW (ref 12.0–15.0)
Immature Granulocytes: 1 %
Lymphocytes Relative: 4 %
Lymphs Abs: 0.2 10*3/uL — ABNORMAL LOW (ref 0.7–4.0)
MCH: 33.7 pg (ref 26.0–34.0)
MCHC: 34.8 g/dL (ref 30.0–36.0)
MCV: 97 fL (ref 80.0–100.0)
Monocytes Absolute: 0.7 10*3/uL (ref 0.1–1.0)
Monocytes Relative: 14 %
Neutro Abs: 3.6 10*3/uL (ref 1.7–7.7)
Neutrophils Relative %: 71 %
Platelets: 140 10*3/uL — ABNORMAL LOW (ref 150–400)
RBC: 3.38 MIL/uL — ABNORMAL LOW (ref 3.87–5.11)
RDW: 14.5 % (ref 11.5–15.5)
WBC: 5 10*3/uL (ref 4.0–10.5)
nRBC: 0 % (ref 0.0–0.2)

## 2020-11-12 NOTE — Progress Notes (Signed)
[place] OFFICE PROGRESS NOTE  [name], [name] [name], [name] [name], [name] [name], [name]  Cancer Staging No matching staging information was found for the patient.   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   # FEB 2018- Ga PET- uptake in liver/ mesenteric mass; NOV 2019- increase sandostatin to 66m q monthly.   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  In a wheelchair.  Alone.  [name].  female pleasant patient above history of metastatic carcinoid tumor currently on Sandostatin/; Lutathera infusions every 2 months ; iron deficient anemia question AV malformation is here for follow-up.  She received infusion #4  on middle of September 2022 patient tolerated well.  Mild nausea/fatigue.  Otherwise not any worse.  Patient needed PICC line for IV access; taken out.    Chronic mild diarrhea.  Chronic mild swelling in the legs.  No weight loss.  There is no blood in stools or black or stool.  No abdominal pain.  Review of Systems  Constitutional:  Positive for malaise/fatigue. Negative for chills, diaphoresis, fever and weight loss.  HENT:  Negative for nosebleeds and sore throat.   Eyes:  Negative for double vision.  Respiratory:  Negative for hemoptysis, sputum production, shortness of breath and wheezing.   Cardiovascular:  Negative for chest pain, palpitations and orthopnea.  Gastrointestinal:  Positive for abdominal pain and diarrhea. Negative for constipation, heartburn, melena, nausea and vomiting.  Genitourinary:  Negative for dysuria, frequency and urgency.  Musculoskeletal:  Positive for back pain. Negative for joint pain.  Skin: Negative.  Negative for itching and rash.  Neurological:  Negative for dizziness, tingling, focal weakness, weakness and headaches.  Endo/Heme/Allergies:  Does not bruise/bleed easily.  Psychiatric/Behavioral:  Negative for depression. The patient is not nervous/anxious and does not have insomnia.      PAST MEDICAL HISTORY :  Past Medical History:  Diagnosis Date   Aromatase inhibitor use    Breast cancer ([place] 2014   left breast cancer/radiation  Breast cancer, left breast ([place]) 06/19/2014   CHF (congestive heart failure) (HCC)    recent sepsis   Chronic kidney disease    Dizziness    Dyspnea    recently while admitted   GERD (gastroesophageal reflux  disease)    Hearing loss    History of hiatal hernia    History of kidney stones    HOH (hard of hearing)    severe   Hypertension    Hypothyroidism    Leg DVT (deep venous thromboembolism), acute ([place]) 07/2015   left leg after fracture   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   Pain    chest pain while admitted   Personal history of radiation therapy 2015   left breast ca   Thyroid disease     PAST SURGICAL HISTORY :   Past Surgical History:  Procedure Laterality Date   ABDOMINAL HYSTERECTOMY     partial   BREAST LUMPECTOMY Left 01/13/2013   invasive mammary carcinoma, clear margins, LN negative   BREAST LUMPECTOMY WITH SENTINEL LYMPH NODE BIOPSY Left 2014   CHOLECYSTECTOMY     COLONOSCOPY WITH PROPOFOL N/A 09/17/2017   Procedure: COLONOSCOPY WITH PROPOFOL;  Surgeon: Jonathon Bellows, MD;  Location[facility] ENDOSCOPY;  Service: Gastroenterology;  Laterality: N/A;   CYSTOSCOPY W/ URETERAL STENT PLACEMENT Right 01/07/2016   Procedure: CYSTOSCOPY WITH STENT REPLACEMENT;  Surgeon: Hollice Espy, MD;  Location: [facility] ORS;  Service: Urology;  Laterality: Right;   CYSTOSCOPY WITH RETROGRADE PYELOGRAM, URETEROSCOPY AND STENT PLACEMENT Right 12/19/2015   Procedure: CYSTOSCOPY WITH RETROGRADE PYELOGRAM, URETEROSCOPY AND STENT PLACEMENT;  Surgeon: Ardis Hughs, MD;  Location: [facility] ORS;  Service: Urology;  Laterality: Right;   URETEROSCOPY WITH HOLMIUM LASER LITHOTRIPSY Right 01/07/2016   Procedure: URETEROSCOPY WITH HOLMIUM LASER LITHOTRIPSY;  Surgeon: Hollice Espy, MD;  Location: [facility] ORS;  Service: Urology;  Laterality: Right;    FAMILY HISTORY :   Family History  Problem Relation Age of Onset   Breast cancer Mother 35       deceased 4   Breast cancer Maternal Aunt 46       deceased 22s   Prostate cancer Father        deceased 56   Colon cancer Paternal Aunt 80       deceased 70s    SOCIAL HISTORY:   Social History   Tobacco Use   Smoking status: Never   Smokeless tobacco: Never   Substance Use Topics   Alcohol use: No    Alcohol/week: 0.0 standard drinks   Drug use: No    ALLERGIES:  is allergic to sulfa antibiotics.  MEDICATIONS:  Current Outpatient Medications  Medication Sig Dispense Refill   Calcium Carb-Cholecalciferol (CALCIUM 600 + D PO) Take 1 tablet by mouth daily.     furosemide (LASIX) 40 MG tablet Take 40 mg by mouth daily as needed for fluid or edema.      hyoscyamine (ANASPAZ) 0.125 MG TBDP disintergrating tablet Place 0.25 mg under the tongue every 4 (four) hours as needed for cramping.     labetalol (NORMODYNE) 200 MG tablet Take 200 mg by mouth 2 (two) times daily.      levothyroxine (SYNTHROID) 88 MCG tablet Take 88 mcg by mouth daily before breakfast.      losartan-hydrochlorothiazide (HYZAAR) 100-25 MG tablet Take 1 tablet by mouth daily.     nystatin (MYCOSTATIN/NYSTOP) powder APPLY TO AFFECTED AREA TWICE A DAY 15 g 3   octreotide (SANDOSTATIN LAR) 30 MG injection Inject 30 mg into  the muscle every 28 (twenty-eight) days.      ondansetron (ZOFRAN) 8 MG tablet Take 1 tablet (8 mg total) by mouth 2 (two) times daily as needed for nausea or vomiting. 20 tablet 0   pantoprazole (PROTONIX) 40 MG tablet Take 1 tablet (40 mg total) by mouth daily. 90 tablet 0   potassium chloride SA (KLOR-CON M20) 20 MEQ tablet TAKE 1 TABLET BY MOUTH EVERY DAY 90 tablet 2   traMADol (ULTRAM) 50 MG tablet 0.5 tablet to 1 tablet every 8 hours as needed for pain 30 tablet 0   Vitamin D, Ergocalciferol, (DRISDOL) 1.25 MG (50000 UNIT) CAPS capsule TAKE 1 CAPSULE BY MOUTH ONE TIME PER WEEK 12 capsule 3   No current facility-administered medications for this visit.    PHYSICAL EXAMINATION: ECOG PERFORMANCE STATUS: 1 - Symptomatic but completely ambulatory  BP (!) 138/52 (BP Location: Right Arm, Patient Position: Sitting)   Pulse 60   Temp 98.1 F (36.7 C) (Oral)   Resp 18   Wt 219 lb (99.3 kg)   SpO2 95%   BMI 38.79 kg/m   Filed Weights   11/12/20 1304   Weight: 219 lb (99.3 kg)     Physical Exam Constitutional:      Comments: Patient is a wheelchair.  She is alone.  Patient is hard of hearing.  HENT:     Head: Normocephalic and atraumatic.     Mouth/Throat:     Pharynx: No oropharyngeal exudate.  Eyes:     Pupils: Pupils are equal, round, and reactive to light.  Cardiovascular:     Rate and Rhythm: Normal rate and regular rhythm.  Pulmonary:     Effort: Pulmonary effort is normal. No respiratory distress.     Breath sounds: Normal breath sounds. No wheezing.  Abdominal:     General: Bowel sounds are normal. There is no distension.     Palpations: Abdomen is soft. There is no mass.     Tenderness: There is no abdominal tenderness. There is no guarding or rebound.  Musculoskeletal:        General: Swelling present. No tenderness. Normal range of motion.     Cervical back: Normal range of motion and neck supple.  Skin:    General: Skin is warm.  Neurological:     Mental Status: She is alert and oriented to person, place, and time.  Psychiatric:        Mood and Affect: Affect normal.    LABORATORY DATA:  I have reviewed the data as listed    Component Value Date/Time   NA 138 11/12/2020 1240   NA 137 07/12/2013 0841   K 3.7 11/12/2020 1240   K 4.7 07/12/2013 0841   CL 104 11/12/2020 1240   CL 107 07/12/2013 0841   CO2 26 11/12/2020 1240   CO2 20 (L) 07/12/2013 0841   GLUCOSE 133 (H) 11/12/2020 1240   GLUCOSE 122 (H) 07/12/2013 0841   BUN 19 11/12/2020 1240   BUN 18 07/12/2013 0841   CREATININE 0.81 11/12/2020 1240   CREATININE 0.64 05/22/2014 1111   CALCIUM 9.0 11/12/2020 1240   CALCIUM 9.2 07/12/2013 0841   PROT 7.0 11/12/2020 1240   PROT 7.0 05/22/2014 1111   ALBUMIN 3.8 11/12/2020 1240   ALBUMIN 4.2 05/22/2014 1111   AST 22 11/12/2020 1240   AST 29 05/22/2014 1111   ALT 18 11/12/2020 1240   ALT 17 05/22/2014 1111   ALKPHOS 65 11/12/2020 1240   ALKPHOS 95 05/22/2014 1111  BILITOT 0.8 11/12/2020 1240    BILITOT 0.8 05/22/2014 1111   GFRNONAA >60 11/12/2020 1240   GFRNONAA >60 05/22/2014 1111   GFRAA >60 10/07/2019 1319   GFRAA >60 05/22/2014 1111    No results found for: SPEP, UPEP  Lab Results  Component Value Date   WBC 5.0 11/12/2020   NEUTROABS 3.6 11/12/2020   HGB 11.4 (L) 11/12/2020   HCT 32.8 (L) 11/12/2020   MCV 97.0 11/12/2020   PLT 140 (L) 11/12/2020      Chemistry      Component Value Date/Time   NA 138 11/12/2020 1240   NA 137 07/12/2013 0841   K 3.7 11/12/2020 1240   K 4.7 07/12/2013 0841   CL 104 11/12/2020 1240   CL 107 07/12/2013 0841   CO2 26 11/12/2020 1240   CO2 20 (L) 07/12/2013 0841   BUN 19 11/12/2020 1240   BUN 18 07/12/2013 0841   CREATININE 0.81 11/12/2020 1240   CREATININE 0.64 05/22/2014 1111      Component Value Date/Time   CALCIUM 9.0 11/12/2020 1240   CALCIUM 9.2 07/12/2013 0841   ALKPHOS 65 11/12/2020 1240   ALKPHOS 95 05/22/2014 1111   AST 22 11/12/2020 1240   AST 29 05/22/2014 1111   ALT 18 11/12/2020 1240   ALT 17 05/22/2014 1111   BILITOT 0.8 11/12/2020 1240   BILITOT 0.8 05/22/2014 1111       RADIOGRAPHIC STUDIES: I have personally reviewed the radiological images as listed and agreed with the findings in the report. No results found.   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 On Lutathera + sandostatin.  Stable.  #Patient status post Lutathera infusion #4 in sep 2022.  S/p Sandostatin post Lutathera infusion in [place].  HOLD off Sandostatin today as we are awaiting Ga scan on 10/19.  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  Hemoglobin 11.4- ?  Secondary  Lutathera.  # chronic mild diarrhea- ? 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   # Poor IV access: PICC line [[place]]  # DISPOSITION- #  HOLD Sandostatin/venofer # Follow up in 1 months MD cbc/cmp/chromogranin- -Sandstatin; possible venofer;  Dr.B    No orders of the defined types were placed in this encounter.  All questions were answered. The patient knows to call the clinic with any problems, questions or concerns.      [name], MD 11/12/2020 1:32 PM

## 2020-11-12 NOTE — Assessment & Plan Note (Signed)
#   Non- functional Small bowel/mesenteric carcinoid; STAGE IV- July 2021-gallium PET scan [compared to January -Overall stable/no evidence of obvious progression; however slight increase in size of the mesenteric mass/liver lesion by few millimeters; also slight increase in SUV uptake. On Lutathera + sandostatin.  Stable.  #Patient status post Lutathera infusion #4 in sep 2022.  S/p Sandostatin post Lutathera infusion in [place].  HOLD off Sandostatin today as we are awaiting Ga scan on 10/19.  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  Hemoglobin 11.4- ?  Secondary Lutathera.  # chronic mild diarrhea- ? 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   # Poor IV access: PICC line [[place]]  # DISPOSITION- #  HOLD Sandostatin/venofer # Follow up in 1 months MD cbc/cmp/chromogranin- -Sandstatin; possible venofer;  Dr.B

## 2020-11-12 NOTE — Progress Notes (Signed)
Has an appetite , eats 2 meals a day. Feels bloated all the time. Has intermittent abd discomfort. Has diarrhea daily. No blood noted in stool. Occassional nausea. Pt feels tired. Sleeps well at night.

## 2020-11-13 LAB — CHROMOGRANIN A: Chromogranin A (ng/mL): 97.1 ng/mL (ref 0.0–101.8)

## 2020-11-14 ENCOUNTER — Ambulatory Visit (HOSPITAL_COMMUNITY)
Admission: RE | Admit: 2020-11-14 | Discharge: 2020-11-14 | Disposition: A | Discharge status: Home / Self Care | Payer: Medicare Other | Source: Ambulatory Visit | Attending: Internal Medicine | Admitting: Internal Medicine

## 2020-11-14 ENCOUNTER — Encounter (HOSPITAL_COMMUNITY)
Admission: RE | Admit: 2020-11-14 | Discharge: 2020-11-14 | Disposition: A | Discharge status: Home / Self Care | Payer: Medicare Other | Source: Ambulatory Visit | Attending: Internal Medicine | Admitting: Internal Medicine

## 2020-11-14 DIAGNOSIS — C7B02 Secondary carcinoid tumors of liver: Secondary | ICD-10-CM | POA: Insufficient documentation

## 2020-11-14 MED ORDER — GALLIUM GA 68 DOTATATE IV KIT
5.6000 | PACK | Freq: Once | INTRAVENOUS | Status: AC | PRN
  Administered 2020-11-14: 5.6 via INTRAVENOUS

## 2020-11-14 NOTE — Consult Note (Signed)
Chief Complaint: Patient with metastatic neuroendocrine tumor post completion of 4 cycles Peptide receptor radiotherapy (PRRT) with EH631 DOTATATE (Lutathera).  Referring Physician(s):Brahmanday   Patient Status: [facility] - Out-pt  History of Present Illness: Emma [name] is a 80 y.o. female with Well differentiated metastatic neuroendocrine tumor.  Initial mesenteric mass identified in June 2015.  Positive octreotide scan confirmed neuroendocrine tumor.  Subsequent gallium 68 dotatate PET scans have further identified neuroendocrine tumor as well differentiated.   Patient initiated long-acting Sandostatin injections and over 2017.  Currently 30 mg Q monthly.    Mild progression of neuroendocrine tumor demonstrated on most recent DOTATATE PET scan of 08/30/2019.]  Patient status post peptide receptor radiotherapy with Lu  177 DOTATATE.  Four doses of 497 millicuries.  final dose on 10/03/2020.     Past Medical History:  Diagnosis Date   Aromatase inhibitor use    Breast cancer ([place]) 2014   left breast cancer/radiation   Breast cancer, left breast ([place]) 06/19/2014   CHF (congestive heart failure) (HCC)    recent sepsis   Chronic kidney disease    Dizziness    Dyspnea    recently while admitted   GERD (gastroesophageal reflux disease)    Hearing loss    History of hiatal hernia    History of kidney stones    HOH (hard of hearing)    severe   Hypertension    Hypothyroidism    Leg DVT (deep venous thromboembolism), acute ([place]) 07/2015   left leg after fracture   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   Pain    chest pain while admitted   Personal history of radiation therapy 2015   left breast ca   Thyroid disease     Past Surgical History:  Procedure Laterality Date   ABDOMINAL HYSTERECTOMY     partial   BREAST LUMPECTOMY Left 01/13/2013   invasive mammary carcinoma, clear margins, LN negative   BREAST LUMPECTOMY WITH SENTINEL LYMPH NODE BIOPSY Left 2014   CHOLECYSTECTOMY      COLONOSCOPY WITH PROPOFOL N/A 09/17/2017   Procedure: COLONOSCOPY WITH PROPOFOL;  Surgeon: Jonathon Bellows, MD;  Location: [facility] ENDOSCOPY;  Service: Gastroenterology;  Laterality: N/A;   CYSTOSCOPY W/ URETERAL STENT PLACEMENT Right 01/07/2016   Procedure: CYSTOSCOPY WITH STENT REPLACEMENT;  Surgeon: Hollice Espy, MD;  Location: [facility] ORS;  Service: Urology;  Laterality: Right;   CYSTOSCOPY WITH RETROGRADE PYELOGRAM, URETEROSCOPY AND STENT PLACEMENT Right 12/19/2015   Procedure: CYSTOSCOPY WITH RETROGRADE PYELOGRAM, URETEROSCOPY AND STENT PLACEMENT;  Surgeon: Ardis Hughs, MD;  Location: [facility] ORS;  Service: Urology;  Laterality: Right;   URETEROSCOPY WITH HOLMIUM LASER LITHOTRIPSY Right 01/07/2016   Procedure: URETEROSCOPY WITH HOLMIUM LASER LITHOTRIPSY;  Surgeon: Hollice Espy, MD;  Location: [facility] ORS;  Service: Urology;  Laterality: Right;    Allergies: Sulfa antibiotics  Medications: Prior to Admission medications   Medication Sig Start Date End Date Taking? Authorizing Provider  Calcium Carb-Cholecalciferol (CALCIUM 600 + D PO) Take 1 tablet by mouth daily.    [provider]  furosemide (LASIX) 40 MG tablet Take 40 mg by mouth daily as needed for fluid or edema.     [provider]  hyoscyamine (ANASPAZ) 0.125 MG TBDP disintergrating tablet Place 0.25 mg under the tongue every 4 (four) hours as needed for cramping.    [provider]  labetalol (NORMODYNE) 200 MG tablet Take 200 mg by mouth 2 (two) times daily.     [provider]  levothyroxine (SYNTHROID) 88 MCG  tablet Take 88 mcg by mouth daily before breakfast.  06/20/14   [provider]  losartan-hydrochlorothiazide (HYZAAR) 100-25 MG tablet Take 1 tablet by mouth daily.    [provider]  nystatin (MYCOSTATIN/NYSTOP) powder APPLY TO AFFECTED AREA TWICE A DAY 01/18/18   [name], MD  octreotide (SANDOSTATIN LAR) 30 MG injection Inject 30 mg into the muscle every  28 (twenty-eight) days.     [provider]  ondansetron (ZOFRAN) 8 MG tablet Take 1 tablet (8 mg total) by mouth 2 (two) times daily as needed for nausea or vomiting. 08/08/20   [name], MD  pantoprazole (PROTONIX) 40 MG tablet Take 1 tablet (40 mg total) by mouth daily. 07/13/20   [name], MD  potassium chloride SA (KLOR-CON M20) 20 MEQ tablet TAKE 1 TABLET BY MOUTH EVERY DAY 04/10/20   [name], MD  traMADol (ULTRAM) 50 MG tablet 0.5 tablet to 1 tablet every 8 hours as needed for pain 01/17/20   [name], MD  Vitamin D, Ergocalciferol, (DRISDOL) 1.25 MG (50000 UNIT) CAPS capsule TAKE 1 CAPSULE BY MOUTH ONE TIME PER WEEK 01/13/20   [name], MD     Family History  Problem Relation Age of Onset   Breast cancer Mother 21       deceased 59   Breast cancer Maternal Aunt 68       deceased 45s   Prostate cancer Father        deceased 60   Colon cancer Paternal Aunt 39       deceased 27s    Social History   Socioeconomic History   Marital status: Widowed    Spouse name: Not on file   Number of children: Not on file   Years of education: Not on file   Highest education level: Not on file  Occupational History   Not on file  Tobacco Use   Smoking status: Never   Smokeless tobacco: Never  Substance and Sexual Activity   Alcohol use: No    Alcohol/week: 0.0 standard drinks   Drug use: No   Sexual activity: Not on file  Other Topics Concern   Not on file  Social History Narrative   Not on file   Social Determinants of Health   Financial Resource Strain: Not on file  Food Insecurity: Not on file  Transportation Needs: Not on file  Physical Activity: Not on file  Stress: Not on file  Social Connections: Not on file    ECOG Status: 1 - Symptomatic but completely ambulatory  Review of Systems: A 12 point ROS discussed and pertinent positives are indicated in the HPI above.  All other systems are  negative.  Review of Systems  Mild diarrhea somewhat improved. Bloating  Vital Signs: There were no vitals taken for this visit.  Physical Exam Defferred for covid  Imaging: NM PET (NETSPOT GA 68 DOTATATE) SKULL BASE TO MID THIGH  Result Date: 11/14/2020 CLINICAL DATA:  Metastatic neuroendocrine tumor. Patient status post peptide receptor radiotherapy with 4 treatments 20 millicurie the T cm 762 Dotatate. Last treatment 10/03/2020. EXAM: NUCLEAR MEDICINE PET SKULL BASE TO THIGH TECHNIQUE: 5.6 mCi gallium 68 DOTATATE was injected intravenously. Full-ring PET imaging was performed from the skull base to thigh after the radiotracer. CT data was obtained and used for attenuation correction and anatomic localization. COMPARISON:  DOTATATE PET scan 08/04/2019 FINDINGS: NECK No radiotracer activity in neck lymph nodes. Incidental CT findings: None CHEST No  radiotracer accumulation within mediastinal or hilar lymph nodes. No suspicious pulmonary nodules on the CT scan. Incidental CT finding:None ABDOMEN/PELVIS Central mesentery partially calcified mass with intense radiotracer activity again noted. Mass measures 3.8 x 2.9 cm (image 139/CT series 4) with SUV max equal 41.5 compared to 4.3 x 3.1 cm with SUV max equal 45.5 on comparison DOTATATE PET scan. Small intense focus of uptake in the adjacent small bowel SUV max equal 18.3 compared SUV max equal 17.9 on image 137 fused data set. No CT correlation. No bowel obstruction. Single radiotracer avid lesion in the LEFT hepatic lobe SUV max equal 16.7 compared SUV max equal 23.2. Lesion difficult define on the noncontrast CT measuring approximately 10 mm on image 104/CT series 4 decreased in size from 16 mm. No new lesions in liver. Small peritoneal implant in the posterior cul-de-sac region with SUV max equal 10.7 (image 67) compared SUV max equal 13.6. No new lesions evident within the mesentery or peritoneal space. Physiologic activity noted in the liver,  spleen, adrenal glands and kidneys. Incidental CT findings:None SKELETON No focal activity to suggest skeletal metastasis. Incidental CT findings:None IMPRESSION: 1. No evidence disease progression within the liver, mesentery, or peritoneal space. No new metastatic neuroendocrine tumor. 2. Mild reduction in size and radiotracer activity of solitary LEFT hepatic lobe metastasis. 3. Similar size and metabolic activity of central mesenteric mass with adjacent small bowel lesion. Electronically Signed   By: Suzy Bouchard M.D.   On: 11/14/2020 13:02    Labs:  CBC: Recent Labs    08/08/20 0904 09/05/20 1304 10/03/20 0841 11/12/20 1240  WBC 5.5 5.2 4.9 5.0  HGB 11.6* 11.7* 11.6* 11.4*  HCT 35.0* 34.7* 34.0* 32.8*  PLT 161 148* 145* 140*    COAGS: No results for input(s): INR, APTT in the last 8760 hours.  BMP: Recent Labs    08/08/20 0904 09/05/20 1304 10/03/20 0841 11/12/20 1240  NA 139 135 138 138  K 3.4* 3.5 3.2* 3.7  CL 104 100 103 104  CO2 26 29 24 26   GLUCOSE 131* 134* 136* 133*  BUN 17 15 17 19   CALCIUM 9.3 8.9 9.0 9.0  CREATININE 0.66 0.78 0.75 0.81  GFRNONAA >60 >60 >60 >60    LIVER FUNCTION TESTS: Recent Labs    08/08/20 0904 09/05/20 1304 10/03/20 0841 11/12/20 1240  BILITOT 0.9 1.0 0.9 0.8  AST 24 24 24 22   ALT 17 17 18 18   ALKPHOS 65 63 74 65  PROT 6.4* 6.6 6.4* 7.0  ALBUMIN 3.6 3.9 3.6 3.8    TUMOR MARKERS: No results for input(s): AFPTM, CEA, CA199, CHROMOGRNA in the last 8760 hours.  Assessment and Plan:  Patient status post 4 cycles of peptide receptor radiotherapy for well differentiated neuroendocrine tumor with liver metastasis and central mesenteric mass.  Patient tolerated the procedure very well.  Patient remains mildly anemic.  Normal renal function. Chromogranin A stable. Subjectively patient reports some improvement in consistency of diarrhea.  Mild bloating.  DOTATATE PET scan 11/14/2020 demonstrates stable neuroendocrine tumor within  liver and mesentery compared July 2021.  No evidence disease progression.  Mild improvement in liver metastasis. Consider continued monthly Sandostatin therapy.  ]  Thank you for this interesting consult.  I greatly enjoyed meeting Emma [name] and look forward to participating in their care.  A copy of this report was sent to the requesting provider on this date.  Electronically Signed: Rennis Golden, MD 11/14/2020, 3:05 PM   I spent a  total of    10 Minutes in face to face in clinical consultation, greater than 50% of which was counseling/coordinating care for metastatic neuroendocrine tumor.

## 2020-11-16 ENCOUNTER — Ambulatory Visit: Payer: Medicare Other

## 2020-11-16 ENCOUNTER — Inpatient Hospital Stay: Payer: Medicare Other

## 2020-11-16 ENCOUNTER — Other Ambulatory Visit: Payer: Self-pay

## 2020-11-16 DIAGNOSIS — K6389 Other specified diseases of intestine: Secondary | ICD-10-CM

## 2020-11-16 DIAGNOSIS — C7A019 Malignant carcinoid tumor of the small intestine, unspecified portion: Secondary | ICD-10-CM | POA: Diagnosis not present

## 2020-11-16 MED ORDER — OCTREOTIDE ACETATE 30 MG IM KIT
30.0000 mg | PACK | Freq: Once | INTRAMUSCULAR | Status: AC
  Administered 2020-11-16: 30 mg via INTRAMUSCULAR
  Filled 2020-11-16: qty 1

## 2020-12-05 ENCOUNTER — Other Ambulatory Visit: Payer: Self-pay | Admitting: *Deleted

## 2020-12-05 ENCOUNTER — Other Ambulatory Visit: Payer: Self-pay | Admitting: Internal Medicine

## 2020-12-05 DIAGNOSIS — K6389 Other specified diseases of intestine: Secondary | ICD-10-CM

## 2020-12-10 ENCOUNTER — Other Ambulatory Visit: Payer: Self-pay

## 2020-12-10 ENCOUNTER — Inpatient Hospital Stay (HOSPITAL_BASED_OUTPATIENT_CLINIC_OR_DEPARTMENT_OTHER): Payer: Medicare Other | Admitting: Internal Medicine

## 2020-12-10 ENCOUNTER — Inpatient Hospital Stay: Payer: Medicare Other

## 2020-12-10 ENCOUNTER — Inpatient Hospital Stay: Payer: Medicare Other | Attending: Internal Medicine

## 2020-12-10 ENCOUNTER — Encounter: Payer: Self-pay | Admitting: Internal Medicine

## 2020-12-10 VITALS — BP 136/99 | HR 53 | Temp 98.1°F | Resp 18 | Wt 220.6 lb

## 2020-12-10 DIAGNOSIS — E039 Hypothyroidism, unspecified: Secondary | ICD-10-CM | POA: Insufficient documentation

## 2020-12-10 DIAGNOSIS — C7A019 Malignant carcinoid tumor of the small intestine, unspecified portion: Secondary | ICD-10-CM | POA: Diagnosis present

## 2020-12-10 DIAGNOSIS — C7B02 Secondary carcinoid tumors of liver: Secondary | ICD-10-CM | POA: Diagnosis not present

## 2020-12-10 DIAGNOSIS — E340 Carcinoid syndrome: Secondary | ICD-10-CM | POA: Insufficient documentation

## 2020-12-10 DIAGNOSIS — N189 Chronic kidney disease, unspecified: Secondary | ICD-10-CM | POA: Diagnosis not present

## 2020-12-10 DIAGNOSIS — D509 Iron deficiency anemia, unspecified: Secondary | ICD-10-CM | POA: Insufficient documentation

## 2020-12-10 DIAGNOSIS — K6389 Other specified diseases of intestine: Secondary | ICD-10-CM

## 2020-12-10 DIAGNOSIS — I130 Hypertensive heart and chronic kidney disease with heart failure and stage 1 through stage 4 chronic kidney disease, or unspecified chronic kidney disease: Secondary | ICD-10-CM | POA: Insufficient documentation

## 2020-12-10 LAB — COMPREHENSIVE METABOLIC PANEL
ALT: 18 U/L (ref 0–44)
AST: 28 U/L (ref 15–41)
Albumin: 3.6 g/dL (ref 3.5–5.0)
Alkaline Phosphatase: 76 U/L (ref 38–126)
Anion gap: 9 (ref 5–15)
BUN: 12 mg/dL (ref 8–23)
CO2: 26 mmol/L (ref 22–32)
Calcium: 8.8 mg/dL — ABNORMAL LOW (ref 8.9–10.3)
Chloride: 101 mmol/L (ref 98–111)
Creatinine, Ser: 0.81 mg/dL (ref 0.44–1.00)
GFR, Estimated: >60 mL/min (ref >60)
Glucose, Bld: 127 mg/dL — ABNORMAL HIGH (ref 70–99)
Potassium: 3.3 mmol/L — ABNORMAL LOW (ref 3.5–5.1)
Sodium: 136 mmol/L (ref 135–145)
Total Bilirubin: 1.1 mg/dL (ref 0.3–1.2)
Total Protein: 6.9 g/dL (ref 6.5–8.1)

## 2020-12-10 LAB — CBC WITH DIFFERENTIAL/PLATELET
Abs Immature Granulocytes: 0.02 10*3/uL (ref 0.00–0.07)
Basophils Absolute: 0 10*3/uL (ref 0.0–0.1)
Basophils Relative: 1 %
Eosinophils Absolute: 0.5 10*3/uL (ref 0.0–0.5)
Eosinophils Relative: 10 %
HCT: 33.6 % — ABNORMAL LOW (ref 36.0–46.0)
Hemoglobin: 11.5 g/dL — ABNORMAL LOW (ref 12.0–15.0)
Immature Granulocytes: 0 %
Lymphocytes Relative: 5 %
Lymphs Abs: 0.2 10*3/uL — ABNORMAL LOW (ref 0.7–4.0)
MCH: 33.5 pg (ref 26.0–34.0)
MCHC: 34.2 g/dL (ref 30.0–36.0)
MCV: 98 fL (ref 80.0–100.0)
Monocytes Absolute: 0.7 10*3/uL (ref 0.1–1.0)
Monocytes Relative: 14 %
Neutro Abs: 3.8 10*3/uL (ref 1.7–7.7)
Neutrophils Relative %: 70 %
Platelets: 149 10*3/uL — ABNORMAL LOW (ref 150–400)
RBC: 3.43 MIL/uL — ABNORMAL LOW (ref 3.87–5.11)
RDW: 14.5 % (ref 11.5–15.5)
WBC: 5.3 10*3/uL (ref 4.0–10.5)
nRBC: 0 % (ref 0.0–0.2)

## 2020-12-10 LAB — FERRITIN: Ferritin: 102 ng/mL (ref 11–307)

## 2020-12-10 LAB — IRON AND TIBC
Iron: 93 ug/dL (ref 28–170)
Saturation Ratios: 26 % (ref 10.4–31.8)
TIBC: 356 ug/dL (ref 250–450)
UIBC: 263 ug/dL

## 2020-12-10 MED ORDER — OCTREOTIDE ACETATE 30 MG IM KIT
30.0000 mg | PACK | Freq: Once | INTRAMUSCULAR | Status: AC
  Administered 2020-12-10: 30 mg via INTRAMUSCULAR
  Filled 2020-12-10: qty 1

## 2020-12-10 NOTE — Assessment & Plan Note (Signed)
#   Non- functional Small bowel/mesenteric carcinoid; STAGE IV- July 2021-gallium PET scan [compared to January -Overall stable/no evidence of obvious progression; however slight increase in size of the mesenteric mass/liver lesion by few millimeters; also slight increase in SUV uptake. On Lutathera + sandostatin x4 infusions.  October 2022-PET scan shows no evidence of progression of disease or any evidence of new disease; liver metastases-slight decrease in size.  #Discussed with the patient and granddaughter that the median PFS after Lutathera infusions is approximately 3 years; so hopefully her disease will be in control for the next at least few years.  Continue Sandostatin at this time.  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  Hemoglobin 11.4- ?  Secondary Lutathera-STABLE; will check iron studies.  # chronic mild diarrhea- ? 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   # Poor IV access: PICC line [[place]]  #Also discussed that with the patient since her malignancy is incurable/and also given her age I think is reasonable to have a discussion regarding advanced directives/living will etc.  Will recommend evaluation with Josh at next visit in 2 months.  # DISPOSITION- #  Ok with Sandostatin today;HOLD venofer # sandostatin in 1 months # Follow up in 2  months MD cbc/cmp/chromogranin- -Sandstatin; possible venofer; # appt with JOsh in 2 months re: advance  Care plannig-   Dr.B  # I reviewed the blood work- with the patient in detail; also reviewed the imaging independently [as summarized above]; and with the patient in detail.

## 2020-12-10 NOTE — Addendum Note (Signed)
Addended by: Vanice Sarah on: 12/10/2020 01:57 PM   Modules accepted: Orders

## 2020-12-10 NOTE — Progress Notes (Signed)
Patient denies new problems/concerns today.   °

## 2020-12-10 NOTE — Progress Notes (Signed)
[facility] OFFICE PROGRESS NOTE  [name], [name] [name], [name] [name], [name] [name], [name]  Cancer Staging No matching staging information was found for the patient.   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   # FEB 2018- Ga PET- uptake in liver/ mesenteric mass; NOV 2019- increase sandostatin to 18m q monthly.   #S/p Lutathera x4 every 2 months; last treatment mid September 2022 [Dr.edmunds]; OCT 2022- PET STABLE.   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  In a wheelchair.  Accompanied by her granddaughter.  Emma [id number].[id number].  female pleasant patient above history of metastatic carcinoid tumor currently on Sandostatin/; Lutathera infusions every 2 months ; iron deficient anemia question AV malformation is here for follow-up/PET scan.  Chronic mild diarrhea.  Chronic mild swelling in the legs.  No weight loss.  There is no blood in stools or black or stool.  No abdominal pain.  Review of Systems  Constitutional:  Positive for malaise/fatigue. Negative for chills, diaphoresis, fever and weight loss.  HENT:  Negative for nosebleeds and sore throat.   Eyes:  Negative for double vision.  Respiratory:  Negative for hemoptysis, sputum production, shortness of breath and wheezing.   Cardiovascular:  Negative for chest pain, palpitations and orthopnea.  Gastrointestinal:  Positive for abdominal pain and diarrhea. Negative for constipation, heartburn, melena, nausea and vomiting.  Genitourinary:  Negative for dysuria, frequency and urgency.  Musculoskeletal:  Positive for back pain. Negative for joint pain.  Skin: Negative.  Negative for itching and rash.  Neurological:  Negative for dizziness, tingling, focal weakness, weakness and headaches.  Endo/Heme/Allergies:  Does not bruise/bleed easily.  Psychiatric/Behavioral:  Negative for depression. The patient is not nervous/anxious and does not have insomnia.      PAST MEDICAL HISTORY :  Past Medical History:  Diagnosis Date   Aromatase inhibitor use    Breast cancer ([facility] 2014   left breast cancer/radiation   [place] cancer, [place] breast ([facility] 06/19/2014   CHF (congestive heart failure) (HCC)    recent sepsis   Chronic kidney disease  Dizziness    Dyspnea    recently while admitted   GERD (gastroesophageal reflux disease)    Hearing loss    History of hiatal hernia    History of kidney stones    HOH (hard of hearing)    severe    Hypertension    Hypothyroidism    Leg DVT (deep venous thromboembolism), acute ([place]) 07/2015   left leg after fracture   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   Pain    chest pain while admitted   Personal history of radiation therapy 2015   left breast ca   Thyroid disease     PAST SURGICAL HISTORY :   Past Surgical History:  Procedure Laterality Date   ABDOMINAL HYSTERECTOMY     partial   BREAST LUMPECTOMY Left 01/13/2013   invasive mammary carcinoma, clear margins, LN negative   BREAST LUMPECTOMY WITH SENTINEL LYMPH NODE BIOPSY Left 2014   CHOLECYSTECTOMY     COLONOSCOPY WITH PROPOFOL N/A 09/17/2017   Procedure: COLONOSCOPY WITH PROPOFOL;  Surgeon: Jonathon Bellows, MD;  [facility] END[facility]COPY;  Service: Gastroenterology;  Laterality: N/A;   CYSTOSCOPY W/ URETERAL STENT PLACEMENT Right 01/07/2016   Procedure: CYSTOSCOPY WITH STENT REPLACEMENT;  Surgeon: Hollice Espy, MD;  Location: [facility] ORS;  Service: Urology;  Laterality: Right;   CYSTOSCOPY WITH RETROGRADE PYELOGRAM, URETEROSCOPY AND STENT PLACEMENT Right 12/19/2015   Procedure: CYSTOSCOPY WITH RETROGRADE PYELOGRAM, URETEROSCOPY AND STENT PLACEMENT;  Surgeon: Ardis Hughs, MD;  Location: [facility] ORS;  Service: Urology;  Laterality: Right;   URETEROSCOPY WITH HOLMIUM LASER LITHOTRIPSY Right 01/07/2016   Procedure: URETEROSCOPY WITH HOLMIUM LASER LITHOTRIPSY;  Surgeon: Hollice Espy, MD;  Location: [facility] ORS;  Service: Urology;  Laterality: Right;    FAMILY HISTORY :   Family History  Problem Relation Age of Onset   Breast cancer Mother 27       deceased 55   Breast cancer Maternal Aunt 68       deceased 31s   Prostate cancer Father        deceased 58   Colon cancer Paternal Aunt 80       deceased 23s    SOCIAL HISTORY:   Social History   Tobacco Use   Smoking status: Never   Smokeless tobacco: Never  Substance Use Topics   Alcohol use: No    Alcohol/week: 0.0 standard drinks   Drug use: No    ALLERGIES:  is allergic  to sulfa antibiotics.  MEDICATIONS:  Current Outpatient Medications  Medication Sig Dispense Refill   Calcium Carb-Cholecalciferol (CALCIUM 600 + D PO) Take 1 tablet by mouth daily.     furosemide (LASIX) 40 MG tablet Take 40 mg by mouth daily as needed for fluid or edema.      hyoscyamine (ANASPAZ) 0.125 MG TBDP disintergrating tablet Place 0.25 mg under the tongue every 4 (four) hours as needed for cramping.     labetalol (NORMODYNE) 200 MG tablet Take 200 mg by mouth 2 (two) times daily.      levothyroxine (SYNTHROID) 88 MCG tablet Take 88 mcg by mouth daily before breakfast.      losartan-hydrochlorothiazide (HYZAAR) 100-25 MG tablet Take 1 tablet by mouth daily.     nystatin (MYCOSTATIN/NYSTOP) powder APPLY TO AFFECTED AREA TWICE A DAY 15 g 3   octreotide (SANDOSTATIN LAR) 30 MG injection Inject 30 mg into the muscle every 28 (twenty-eight) days.      ondansetron (ZOFRAN) 8 MG tablet Take 1 tablet (8 mg total) by mouth 2 (two)  times daily as needed for nausea or vomiting. 20 tablet 0   pantoprazole (PROTONIX) 40 MG tablet TAKE 1 TABLET BY MOUTH EVERY DAY 90 tablet 0   potassium chloride SA (KLOR-CON M20) 20 MEQ tablet TAKE 1 TABLET BY MOUTH EVERY DAY 90 tablet 2   traMADol (ULTRAM) 50 MG tablet 0.5 tablet to 1 tablet every 8 hours as needed for pain 30 tablet 0   Vitamin D, Ergocalciferol, (DRISDOL) 1.25 MG (50000 UNIT) CAPS capsule TAKE 1 CAPSULE BY MOUTH ONE TIME PER WEEK 12 capsule 3   No current facility-administered medications for this visit.   Facility-Administered Medications Ordered in Other Visits  Medication Dose Route Frequency Provider Last Rate Last Admin   octreotide (SANDOSTATIN LAR) IM injection 30 mg  30 mg Intramuscular Once [name], MD        PHYSICAL EXAMINATION: ECOG PERFORMANCE STATUS: 1 - Symptomatic but completely ambulatory  BP (!) 136/99   Pulse (!) 53   Temp 98.1 F (36.7 C)   Resp 18   Wt 220 lb 9.6 oz (100.1 kg)   BMI 39.08 kg/m    Filed Weights   12/10/20 1314  Weight: 220 lb 9.6 oz (100.1 kg)     Physical Exam Constitutional:      Comments: Patient is a wheelchair.  She is alone.  Patient is hard of hearing.  HENT:     Head: Normocephalic and atraumatic.     Mouth/Throat:     Pharynx: No oropharyngeal exudate.  Eyes:     Pupils: Pupils are equal, round, and reactive to light.  Cardiovascular:     Rate and Rhythm: Normal rate and regular rhythm.  Pulmonary:     Effort: Pulmonary effort is normal. No respiratory distress.     Breath sounds: Normal breath sounds. No wheezing.  Abdominal:     General: Bowel sounds are normal. There is no distension.     Palpations: Abdomen is soft. There is no mass.     Tenderness: There is no abdominal tenderness. There is no guarding or rebound.  Musculoskeletal:        General: Swelling present. No tenderness. Normal range of motion.     Cervical back: Normal range of motion and neck supple.  Skin:    General: Skin is warm.  Neurological:     Mental Status: She is alert and oriented to person, place, and time.  Psychiatric:        Mood and Affect: Affect normal.    LABORATORY DATA:  I have reviewed the data as listed    Component Value Date/Time   NA 136 12/10/2020 1251   NA 137 07/12/2013 0841   K 3.3 (L) 12/10/2020 1251   K 4.7 07/12/2013 0841   CL 101 12/10/2020 1251   CL 107 07/12/2013 0841   CO2 26 12/10/2020 1251   CO2 20 (L) 07/12/2013 0841   GLUCOSE 127 (H) 12/10/2020 1251   GLUCOSE 122 (H) 07/12/2013 0841   BUN 12 12/10/2020 1251   BUN 18 07/12/2013 0841   CREATININE 0.81 12/10/2020 1251   CREATININE 0.64 05/22/2014 1111   CALCIUM 8.8 (L) 12/10/2020 1251   CALCIUM 9.2 07/12/2013 0841   PROT 6.9 12/10/2020 1251   PROT 7.0 05/22/2014 1111   ALBUMIN 3.6 12/10/2020 1251   ALBUMIN 4.2 05/22/2014 1111   AST 28 12/10/2020 1251   AST 29 05/22/2014 1111   ALT 18 12/10/2020 1251   ALT 17 05/22/2014 1111   ALKPHOS 76 12/10/2020  1251   ALKPHOS  95 05/22/2014 1111   BILITOT 1.1 12/10/2020 1251   BILITOT 0.8 05/22/2014 1111   GFRNONAA >60 12/10/2020 1251   GFRNONAA >60 05/22/2014 1111   GFRAA >60 10/07/2019 1319   GFRAA >60 05/22/2014 1111    No results found for: SPEP, UPEP  Lab Results  Component Value Date   WBC 5.3 12/10/2020   NEUTROABS 3.8 12/10/2020   HGB 11.5 (L) 12/10/2020   HCT 33.6 (L) 12/10/2020   MCV 98.0 12/10/2020   PLT 149 (L) 12/10/2020      Chemistry      Component Value Date/Time   NA 136 12/10/2020 1251   NA 137 07/12/2013 0841   K 3.3 (L) 12/10/2020 1251   K 4.7 07/12/2013 0841   CL 101 12/10/2020 1251   CL 107 07/12/2013 0841   CO2 26 12/10/2020 1251   CO2 20 (L) 07/12/2013 0841   BUN 12 12/10/2020 1251   BUN 18 07/12/2013 0841   CREATININE 0.81 12/10/2020 1251   CREATININE 0.64 05/22/2014 1111      Component Value Date/Time   CALCIUM 8.8 (L) 12/10/2020 1251   CALCIUM 9.2 07/12/2013 0841   ALKPHOS 76 12/10/2020 1251   ALKPHOS 95 05/22/2014 1111   AST 28 12/10/2020 1251   AST 29 05/22/2014 1111   ALT 18 12/10/2020 1251   ALT 17 05/22/2014 1111   BILITOT 1.1 12/10/2020 1251   BILITOT 0.8 05/22/2014 1111       RADIOGRAPHIC STUDIES: I have personally reviewed the radiological images as listed and agreed with the findings in the report. No results found.   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 On Lutathera + sandostatin x4 infusions.  October 2022-PET scan shows no evidence of progression of disease or any evidence of new disease; liver metastases-slight decrease in size.  #Discussed with the patient and granddaughter that the median PFS after Lutathera infusions is  approximately 3 years; so hopefully her disease will be in control for the next at least few years.  Continue Sandostatin at this time.  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  Hemoglobin 11.4- ?  Secondary Lutathera-STABLE; will check iron studies.  # chronic mild diarrhea- ? 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   # Poor IV access: PICC line [[place]]  #Also discussed that with the patient since her malignancy is incurable/and also given her age I think is reasonable to have a discussion regarding advanced directives/living will etc.  Will recommend evaluation with Josh at next visit in 2 months.  # DISPOSITION- #  Ok with Sandostatin today;HOLD venofer # sandostatin in 1 months # Follow up in 2  months MD cbc/cmp/chromogranin- -Sandstatin; possible venofer; # appt with JOsh in 2 months re: advance  Care plannig-   Dr.B  # I reviewed the blood work- with the patient in detail; also reviewed the imaging independently [as summarized above]; and with the patient in detail.     Orders Placed This Encounter  Procedures   CBC with Differential/Platelet    Standing Status:   Future    Standing Expiration Date:   12/10/2021   Comprehensive metabolic panel    Standing Status:  Future    Standing Expiration Date:   12/10/2021   Chromogranin A    Standing Status:   Future    Standing Expiration Date:   12/10/2021    All questions were answered. The patient knows to call the clinic with any problems, questions or concerns.      [name], MD 12/10/2020 1:50 PM

## 2021-01-07 ENCOUNTER — Other Ambulatory Visit: Payer: Self-pay

## 2021-01-07 ENCOUNTER — Inpatient Hospital Stay: Payer: Medicare Other | Attending: Internal Medicine

## 2021-01-07 DIAGNOSIS — K6389 Other specified diseases of intestine: Secondary | ICD-10-CM

## 2021-01-07 DIAGNOSIS — E340 Carcinoid syndrome: Secondary | ICD-10-CM | POA: Diagnosis not present

## 2021-01-07 DIAGNOSIS — C7A092 Malignant carcinoid tumor of the stomach: Secondary | ICD-10-CM | POA: Diagnosis not present

## 2021-01-07 DIAGNOSIS — C7B02 Secondary carcinoid tumors of liver: Secondary | ICD-10-CM | POA: Insufficient documentation

## 2021-01-07 MED ORDER — OCTREOTIDE ACETATE 30 MG IM KIT
30.0000 mg | PACK | Freq: Once | INTRAMUSCULAR | Status: AC
  Administered 2021-01-07: 30 mg via INTRAMUSCULAR
  Filled 2021-01-07: qty 1

## 2021-01-09 ENCOUNTER — Telehealth: Payer: Self-pay | Admitting: *Deleted

## 2021-01-09 NOTE — Telephone Encounter (Signed)
Emma [name] called back Pain started last week sharp pains feels she has to go to bathroom, but she does not have bowel movement . It wakes her from sleep. Patient refusing to take pain medicine stating that she thinks it just goes along with the cancer. Nor does she want to take Tums. Can anything else be offered? Perhaps a phone call to discuss with patient.

## 2021-01-09 NOTE — Telephone Encounter (Signed)
I spoke with patient's daughter by phone.  She says that abdominal pain is intermittent and nonfocal and has been present for quite some time the patient has failed to relay this to [place].  No fever or chills.  Patient has occasional nausea but no vomiting, diarrhea, constipation.  No abdominal distention.  Offered [facility] visit but daughter would prefer patient to be seen next week.  She does not feel that patient would do well with a virtual visit.  We discussed that pain is most likely secondary to her cancer and could be treated with pain medication.  However, patient has been resistant to take medication.  We agreed to schedule [facility] visit next week and I can discuss options with patient for pain management.

## 2021-01-09 NOTE — Telephone Encounter (Signed)
Daughter Emma [name] called and left message that she wants a return call regarding the fact that patient is having abdominal pain at night. I have attempted to reach her on her home and cell number, but am unable to leave a message on cell and did leave message on her home number

## 2021-01-15 ENCOUNTER — Inpatient Hospital Stay: Payer: Medicare Other | Admitting: Hospice and Palliative Medicine

## 2021-02-04 ENCOUNTER — Inpatient Hospital Stay (HOSPITAL_BASED_OUTPATIENT_CLINIC_OR_DEPARTMENT_OTHER): Payer: Medicare Other | Admitting: Internal Medicine

## 2021-02-04 ENCOUNTER — Inpatient Hospital Stay: Payer: Medicare Other | Attending: Internal Medicine

## 2021-02-04 ENCOUNTER — Inpatient Hospital Stay (HOSPITAL_BASED_OUTPATIENT_CLINIC_OR_DEPARTMENT_OTHER): Payer: Medicare Other | Admitting: Hospice and Palliative Medicine

## 2021-02-04 ENCOUNTER — Other Ambulatory Visit: Payer: Self-pay

## 2021-02-04 ENCOUNTER — Inpatient Hospital Stay: Payer: Medicare Other

## 2021-02-04 ENCOUNTER — Encounter: Payer: Self-pay | Admitting: Internal Medicine

## 2021-02-04 DIAGNOSIS — D509 Iron deficiency anemia, unspecified: Secondary | ICD-10-CM | POA: Diagnosis not present

## 2021-02-04 DIAGNOSIS — C7A019 Malignant carcinoid tumor of the small intestine, unspecified portion: Secondary | ICD-10-CM | POA: Insufficient documentation

## 2021-02-04 DIAGNOSIS — I130 Hypertensive heart and chronic kidney disease with heart failure and stage 1 through stage 4 chronic kidney disease, or unspecified chronic kidney disease: Secondary | ICD-10-CM | POA: Insufficient documentation

## 2021-02-04 DIAGNOSIS — C7B02 Secondary carcinoid tumors of liver: Secondary | ICD-10-CM | POA: Insufficient documentation

## 2021-02-04 DIAGNOSIS — N189 Chronic kidney disease, unspecified: Secondary | ICD-10-CM | POA: Diagnosis not present

## 2021-02-04 DIAGNOSIS — Z515 Encounter for palliative care: Secondary | ICD-10-CM

## 2021-02-04 DIAGNOSIS — R197 Diarrhea, unspecified: Secondary | ICD-10-CM | POA: Insufficient documentation

## 2021-02-04 DIAGNOSIS — K6389 Other specified diseases of intestine: Secondary | ICD-10-CM

## 2021-02-04 DIAGNOSIS — E340 Carcinoid syndrome: Secondary | ICD-10-CM | POA: Insufficient documentation

## 2021-02-04 LAB — CBC WITH DIFFERENTIAL/PLATELET
Abs Immature Granulocytes: 0.02 10*3/uL (ref 0.00–0.07)
Basophils Absolute: 0.1 10*3/uL (ref 0.0–0.1)
Basophils Relative: 1 %
Eosinophils Absolute: 0.3 10*3/uL (ref 0.0–0.5)
Eosinophils Relative: 5 %
HCT: 32.9 % — ABNORMAL LOW (ref 36.0–46.0)
Hemoglobin: 11.2 g/dL — ABNORMAL LOW (ref 12.0–15.0)
Immature Granulocytes: 0 %
Lymphocytes Relative: 8 %
Lymphs Abs: 0.4 10*3/uL — ABNORMAL LOW (ref 0.7–4.0)
MCH: 33.4 pg (ref 26.0–34.0)
MCHC: 34 g/dL (ref 30.0–36.0)
MCV: 98.2 fL (ref 80.0–100.0)
Monocytes Absolute: 0.7 10*3/uL (ref 0.1–1.0)
Monocytes Relative: 14 %
Neutro Abs: 3.7 10*3/uL (ref 1.7–7.7)
Neutrophils Relative %: 72 %
Platelets: 149 10*3/uL — ABNORMAL LOW (ref 150–400)
RBC: 3.35 MIL/uL — ABNORMAL LOW (ref 3.87–5.11)
RDW: 13.5 % (ref 11.5–15.5)
WBC: 5.2 10*3/uL (ref 4.0–10.5)
nRBC: 0 % (ref 0.0–0.2)

## 2021-02-04 LAB — COMPREHENSIVE METABOLIC PANEL
ALT: 18 U/L (ref 0–44)
AST: 25 U/L (ref 15–41)
Albumin: 3.8 g/dL (ref 3.5–5.0)
Alkaline Phosphatase: 72 U/L (ref 38–126)
Anion gap: 8 (ref 5–15)
BUN: 20 mg/dL (ref 8–23)
CO2: 25 mmol/L (ref 22–32)
Calcium: 8.9 mg/dL (ref 8.9–10.3)
Chloride: 105 mmol/L (ref 98–111)
Creatinine, Ser: 0.86 mg/dL (ref 0.44–1.00)
GFR, Estimated: >60 mL/min (ref >60)
Glucose, Bld: 144 mg/dL — ABNORMAL HIGH (ref 70–99)
Potassium: 3.5 mmol/L (ref 3.5–5.1)
Sodium: 138 mmol/L (ref 135–145)
Total Bilirubin: 0.7 mg/dL (ref 0.3–1.2)
Total Protein: 6.8 g/dL (ref 6.5–8.1)

## 2021-02-04 MED ORDER — OCTREOTIDE ACETATE 30 MG IM KIT
30.0000 mg | PACK | Freq: Once | INTRAMUSCULAR | Status: AC
  Administered 2021-02-04: 30 mg via INTRAMUSCULAR
  Filled 2021-02-04: qty 1

## 2021-02-04 NOTE — Progress Notes (Signed)
[name]  Telephone:([phone] [phone]   Name: Emma [name] Date: 02/04/2021 MRN: [id number]  DOB: 1940/09/27  [name], [name] [name], [name] [name], [name] [name], [name]    REASON FOR CONSULTATION: Emma [name] is a 81 y.o. 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  Patient has chronic symptoms including diarrhea and intermittent abdominal pain.  She was referred to palliative care to help address goals and manage ongoing symptoms.  SOCIAL HISTORY:     reports that she has never smoked. She has never used smokeless tobacco. She reports that she does not drink alcohol and does not use drugs.   Patient is widowed.  She lives at home with her daughter and granddaughter.  She has another daughter who lives nearby.  Patient had a variety of jobs including textiles.  ADVANCE DIRECTIVES:  Does not have  CODE STATUS:   PAST MEDICAL HISTORY: Past Medical History:  Diagnosis Date   Aromatase inhibitor use    Breast cancer ([place]) 2014   left breast cancer/radiation   Breast cancer, left breast ([place]) 06/19/2014   CHF (congestive heart failure) (HCC)    recent sepsis   Chronic kidney disease    Dizziness    Dyspnea    recently while admitted   GERD (gastroesophageal reflux disease)    Hearing loss    History of hiatal hernia    History of kidney stones    HOH (hard of hearing)    severe   Hypertension    Hypothyroidism    Leg DVT (deep venous thromboembolism), acute ([place]) 07/2015   left leg after fracture   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   Pain    chest pain while admitted   Personal history of radiation therapy 2015   left breast ca    Thyroid disease     PAST SURGICAL HISTORY:  Past Surgical History:  Procedure Laterality Date   ABDOMINAL HYSTERECTOMY     partial   BREAST LUMPECTOMY Left 01/13/2013   invasive mammary carcinoma, clear margins, LN negative   BREAST LUMPECTOMY WITH SENTINEL LYMPH NODE BIOPSY Left 2014   CHOLECYSTECTOMY     COLONOSCOPY WITH PROPOFOL N/A 09/17/2017   Procedure: COLONOSCOPY WITH PROPOFOL;  Surgeon: Jonathon Bellows, MD;  Location: [facility] ENDOSCOPY;  Service: Gastroenterology;  Laterality: N/A;   CYSTOSCOPY W/ URETERAL STENT PLACEMENT Right 01/07/2016   Procedure: CYSTOSCOPY WITH STENT REPLACEMENT;  Surgeon: Hollice Espy, MD;  Location: [facility] ORS;  Service: Urology;  Laterality: Right;   CYSTOSCOPY WITH RETROGRADE PYELOGRAM, URETEROSCOPY AND STENT PLACEMENT Right 12/19/2015   Procedure: CYSTOSCOPY WITH RETROGRADE PYELOGRAM, URETEROSCOPY AND STENT PLACEMENT;  Surgeon: Ardis Hughs, MD;  Location: [facility] ORS;  Service: Urology;  Laterality: Right;   URETEROSCOPY WITH HOLMIUM LASER LITHOTRIPSY Right 01/07/2016   Procedure: URETEROSCOPY WITH HOLMIUM LASER LITHOTRIPSY;  Surgeon: Hollice Espy, MD;  Location: [facility] ORS;  Service: Urology;  Laterality: Right;    HEMATOLOGY/ONCOLOGY HISTORY:  Oncology History Overview Note  # JUNE 2015-MESENTERIC MASS [~5-6cm] Presumed CARCINOID with mets to liver [AUG 2016-Octreoscan; Dr.Hurwitz; [facility]]; FEB 2016- START OCTREOTIDE LAR qM; Octreo scan- FEB 2017- STABLE; cont Octreotide'; OCT 5th OCTREO SCAN- STABLE; mesenteric mass present.   # FEB 2018- Ga PET- uptake in liver/ mesenteric mass; NOV  2019- increase sandostatin to 58m q monthly.   #S/p Lutathera x4 every 2 months; last treatment mid September 2022 [Dr.edmunds]; OCT 2022- PET STABLE.   # Jan/FEB 2018- Liver MRI- "fat sparing" Bx- neg; no further wu recom  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] s/p Lumpec & RT; ER/PR > 90%; Her2 Neu-NEG; Arimidex; MAY 2016- Mammo-NEG; [until summer  2020]  # IDA s/p IV iron; last March 2014; Colo [Dr.Elliot; Jan 2016] colon angiotele- s/p Argon laser; April 2017- Ferrahem  # DVT [taken off eliquis DEC 2017]; NOV -DEC 2017 CHF/ UTI with sepsis- stone extracted [Dr.Brandon]  # Thyroid nodule- MNG s/p Bx [NOV 2016]/ right adnexal mass [stable since 2010]; hard of hearing  MOLECULAR TESTING- Not done  GENETIC TESTING- MMaud ? 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  MEDICATIONS:  Current Outpatient Medications  Medication Sig Dispense Refill   Calcium Carb-Cholecalciferol (CALCIUM 600 + D PO) Take 1 tablet by mouth daily.     furosemide (LASIX) 40 MG tablet Take 40 mg by mouth daily as needed for fluid or edema.      hyoscyamine (ANASPAZ) 0.125 MG TBDP disintergrating tablet Place 0.25 mg under the tongue every 4 (four) hours as needed for cramping.     labetalol (NORMODYNE) 200 MG tablet Take 200 mg by mouth 2 (two) times daily.      levothyroxine (SYNTHROID) 88 MCG tablet Take 88 mcg by mouth daily before breakfast.      losartan-hydrochlorothiazide (HYZAAR) 100-25 MG tablet Take 1 tablet by mouth daily.     nystatin (MYCOSTATIN/NYSTOP) powder APPLY TO AFFECTED AREA TWICE A DAY 15 g 3   octreotide (SANDOSTATIN LAR) 30 MG injection Inject 30 mg into the muscle every 28 (twenty-eight) days.      ondansetron (ZOFRAN) 8 MG tablet Take 1 tablet (8 mg total) by mouth 2 (two) times daily as needed for nausea or vomiting. 20 tablet 0   pantoprazole (PROTONIX) 40 MG tablet TAKE 1 TABLET BY MOUTH EVERY DAY 90 tablet 0   potassium  chloride SA (KLOR-CON M20) 20 MEQ tablet TAKE 1 TABLET BY MOUTH EVERY DAY 90 tablet 2   traMADol (ULTRAM) 50 MG tablet 0.5 tablet to 1 tablet every 8 hours as needed for pain 30 tablet 0   Vitamin D, Ergocalciferol, (DRISDOL) 1.25 MG (50000 UNIT) CAPS capsule TAKE 1 CAPSULE BY MOUTH ONE TIME PER WEEK 12 capsule 3   No current facility-administered medications for this visit.   Facility-Administered Medications Ordered in Other Visits  Medication Dose Route Frequency Provider Last Rate Last Admin   octreotide (SANDOSTATIN LAR) IM injection 30 mg  30 mg Intramuscular Once BCammie Sickle MD        VITAL SIGNS: There were no vitals taken for this visit. There were no vitals filed for this visit.  Estimated body mass index is 38.48 kg/m as calculated from the following:   Height as of 05/30/20: 5' 3"  (1.6 m).   Weight as of an earlier encounter on 02/04/21: 217 lb 3.2 oz (98.5 kg).  LABS: CBC:    Component Value  Date/Time   WBC 5.2 02/04/2021 1240   HGB 11.2 (L) 02/04/2021 1240   HGB 11.7 (L) 05/22/2014 1111   HCT 32.9 (L) 02/04/2021 1240   HCT 36.4 05/22/2014 1111   PLT 149 (L) 02/04/2021 1240   PLT 173 05/22/2014 1111   MCV 98.2 02/04/2021 1240   MCV 78 (L) 05/22/2014 1111   NEUTROABS 3.7 02/04/2021 1240   NEUTROABS 4.1 05/22/2014 1111   LYMPHSABS 0.4 (L) 02/04/2021 1240   LYMPHSABS 0.6 (L) 05/22/2014 1111   MONOABS 0.7 02/04/2021 1240   MONOABS 0.6 05/22/2014 1111   EOSABS 0.3 02/04/2021 1240   EOSABS 0.3 05/22/2014 1111   BASOSABS 0.1 02/04/2021 1240   BASOSABS 0.1 05/22/2014 1111   Comprehensive Metabolic Panel:    Component Value Date/Time   NA 138 02/04/2021 1240   NA 137 07/12/2013 0841   K 3.5 02/04/2021 1240   K 4.7 07/12/2013 0841   CL 105 02/04/2021 1240   CL 107 07/12/2013 0841   CO2 25 02/04/2021 1240   CO2 20 (L) 07/12/2013 0841   BUN 20 02/04/2021 1240   BUN 18 07/12/2013 0841   CREATININE 0.86 02/04/2021 1240   CREATININE 0.64 05/22/2014 1111    GLUCOSE 144 (H) 02/04/2021 1240   GLUCOSE 122 (H) 07/12/2013 0841   CALCIUM 8.9 02/04/2021 1240   CALCIUM 9.2 07/12/2013 0841   AST 25 02/04/2021 1240   AST 29 05/22/2014 1111   ALT 18 02/04/2021 1240   ALT 17 05/22/2014 1111   ALKPHOS 72 02/04/2021 1240   ALKPHOS 95 05/22/2014 1111   BILITOT 0.7 02/04/2021 1240   BILITOT 0.8 05/22/2014 1111   PROT 6.8 02/04/2021 1240   PROT 7.0 05/22/2014 1111   ALBUMIN 3.8 02/04/2021 1240   ALBUMIN 4.2 05/22/2014 1111    RADIOGRAPHIC STUDIES: No results found.  PERFORMANCE STATUS (ECOG) : 2 - Symptomatic, <50% confined to bed  Review of Systems Unless otherwise noted, a complete review of systems is negative.  Physical Exam General: NAD Pulmonary: Unlabored Extremities: no edema, no joint deformities Skin: no rashes Neurological: Weakness, extremely hard of hearing  IMPRESSION: Palliative care follow-up.  Patient denies significant changes or concerns today.  She reports limited mobility as she is mostly in the wheelchair due to fear of falls/fractures.  However, she reports that she is still fairly independent despite her mobility limitations.  Patient would probably benefit from establishing care with Gwenette Greet to assess for OT needs.   Patient endorses occasional pain but does not feel like this is a current problem.  She continues to have chronic diarrhea.  Patient reports good social support at home.  Had previously reviewed MOST Form with patient, which we again discussed today.  Patient did not recall this form and I sent her home with another copy and encouraged her to speak with her family about decision-making.  Also sent her home with ACP documents.  Patient said that she did not think that she would want aggressive measures at end-of-life.  I encouraged her to have a conversation with her daughters regarding her wishes.  I would be happy to help her complete a MOST form at time of next visit if patient is  interested  PLAN: -Continue current scope of treatment -ACP/MOST form reviewed -RTC in 2 months  Case and plan discussed with Dr. Rogue Bussing   Patient expressed understanding and was in agreement with this plan. She also understands that She can call the clinic at any time with any questions, concerns, or  complaints.     Time Total: 15 minutes  Visit consisted of counseling and education dealing with the complex and emotionally intense issues of symptom management and palliative care in the setting of serious and potentially life-threatening illness.Greater than 50%  of this time was spent counseling and coordinating care related to the above assessment and plan.  Signed by: Altha Harm, PhD, NP-C

## 2021-02-04 NOTE — Assessment & Plan Note (Addendum)
#   Non- functional Small bowel/mesenteric carcinoid; s/p Lutathera + sandostatin x4 infusions.  October 2022-PET scan shows no evidence of progression of disease or any evidence of new disease; liver metastases-slight decrease in size.  #Continue Sandostatin q M. Labs today reviewed;  acceptable for treatment today.   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  Hemoglobin 11.2- ?  Secondary Lutathera- STABLE; Hold off venofer for now.   # chronic mild diarrhea- ? 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   ## Palliative care evaluation: Introduced palliative care philosophy and services. I think is reasonable to discuss advance care directives in the context of incurable disease.  Patient is interested; await evaluation with Josh today.  Discussed with Josh.  # DISPOSITION- #  Ok with Sandostatin today;HOLD venofer # sandostatin in 1 months # Follow up in 2  months MD cbc/cmp/chromogranin- -Sandstatin; possible venofer- Dr.B

## 2021-02-04 NOTE — Progress Notes (Signed)
Needs refill potassium.

## 2021-02-04 NOTE — Progress Notes (Signed)
[facility] OFFICE PROGRESS NOTE  [name], [name] [name], [name] [name], [name] [name], [name]   Cancer Staging  No matching staging information was found for the patient.   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   # FEB 2018- [place] PET- uptake in liver/ mesenteric mass; NOV 2019- increase sandostatin to 65m q monthly.   #S/p Lutathera x4 every 2 months; last treatment mid September 2022 [Dr.edmunds]; OCT 2022- PET STABLE.   # Jan/FEB 2018- Liver MRI- "fat sparing" Bx- neg; no further wu recom  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] s/p Lumpec & RT; ER/PR > 90%; Her2 Neu-NEG; Arimidex; MAY 2016- Mammo-NEG; [until summer 2020]  # IDA s/p IV iron; last March 2014; [place] [Dr.Elliot; Jan 2016] colon angiotele- s/p Argon laser; April 2017- Ferrahem  # DVT [taken off eliquis DEC 2017]; NOV -DEC 2017 CHF/ UTI with sepsis- stone extracted [Dr.Brandon]  # Thyroid nodule- MNG s/p Bx [NOV 2016]/ right adnexal mass [stable since 2010]; hard of hearing  MOLECULAR TESTING- Not done  GENETIC TESTING- [place] ? 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  In a wheelchair.  Alone.  [name].  female pleasant patient above history of metastatic carcinoid tumor currently on Sandostatin/; Lutathera infusions every 2 months ; iron deficient anemia question AV malformation is here for follow-up.  Patient denies any worsening abdominal pain.  Chronic intermittent diarrhea. No weight loss.  There is no blood in stools or black or stool.   Review of Systems  Constitutional:  Positive for malaise/fatigue. Negative for chills, diaphoresis, fever and weight loss.  HENT:  Negative for nosebleeds and sore throat.   Eyes:  Negative for double vision.  Respiratory:  Negative for hemoptysis, sputum production, shortness of breath and wheezing.   Cardiovascular:  Negative for chest pain, palpitations and orthopnea.  Gastrointestinal:  Positive for abdominal pain and diarrhea. Negative for constipation, heartburn, melena, nausea and vomiting.  Genitourinary:  Negative for dysuria, frequency and urgency.  Musculoskeletal:  Positive for back pain. Negative for joint pain.  Skin: Negative.  Negative for itching and rash.  Neurological:  Negative for dizziness, tingling, focal weakness, weakness and headaches.  Endo/Heme/Allergies:  Does not bruise/bleed easily.  Psychiatric/Behavioral:  Negative for depression. The patient is not nervous/anxious and does not have insomnia.      PAST MEDICAL HISTORY :  Past Medical History:  Diagnosis Date   Aromatase inhibitor use    [place]ast [place] [place]   left breast cancer/radiation   [place]   CHF (congestive heart failure) (HCC)    recent sepsis   Chronic kidney disease    Dizziness    Dyspnea  recently while admitted   GERD (gastroesophageal reflux disease)    Hearing loss    History of hiatal hernia    History of kidney stones    HOH (hard of hearing)    severe   Hypertension    Hypothyroidism    Leg  DVT (deep venous thromboembolism), acute ([place]) 07/2015   left leg after fracture   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   Pain    chest pain while admitted   Personal history of radiation therapy 2015   left breast ca   Thyroid disease     PAST SURGICAL HISTORY :   Past Surgical History:  Procedure Laterality Date   ABDOMINAL HYSTERECTOMY     partial   BREAST LUMPECTOMY Left 01/13/2013   invasive mammary carcinoma, clear margins, LN negative   BREAST LUMPECTOMY WITH SENTINEL LYMPH NODE BIOPSY Left 2014   CHOLECYSTECTOMY     COLONOSCOPY WITH PROPOFOL N/A 09/17/2017   Procedure: COLONOSCOPY WITH PROPOFOL;  Surgeon: Jonathon Bellows, MD;  Location: [facility] ENDOSCOPY;  Service: Gastroenterology;  Laterality: N/A;   CYSTOSCOPY W/ URETERAL STENT PLACEMENT Right 01/07/2016   Procedure: CYSTOSCOPY WITH STENT REPLACEMENT;  Surgeon: Hollice Espy, MD;  Location: [facility] ORS;  Service: Urology;  Laterality: Right;   CYSTOSCOPY WITH RETROGRADE PYELOGRAM, URETEROSCOPY AND STENT PLACEMENT Right 12/19/2015   Procedure: CYSTOSCOPY WITH RETROGRADE PYELOGRAM, URETEROSCOPY AND STENT PLACEMENT;  Surgeon: Ardis Hughs, MD;  Location: [facility] ORS;  Service: Urology;  Laterality: Right;   URETEROSCOPY WITH HOLMIUM LASER LITHOTRIPSY Right 01/07/2016   Procedure: URETEROSCOPY WITH HOLMIUM LASER LITHOTRIPSY;  Surgeon: Hollice Espy, MD;  Location: [facility] ORS;  Service: Urology;  Laterality: Right;    FAMILY HISTORY :   Family History  Problem Relation Age of Onset   Breast cancer Mother 54       deceased 42   Breast cancer Maternal Aunt 43       deceased 43s   Prostate cancer Father        deceased 59   Colon cancer Paternal Aunt 80       deceased 34s    SOCIAL HISTORY:   Social History   Tobacco Use   Smoking status: Never   Smokeless tobacco: Never  Substance Use Topics   Alcohol use: No    Alcohol/week: 0.0 standard drinks   Drug use: No    ALLERGIES:  is allergic to sulfa antibiotics.  MEDICATIONS:   Current Outpatient Medications  Medication Sig Dispense Refill   Calcium Carb-Cholecalciferol (CALCIUM 600 + D PO) Take 1 tablet by mouth daily.     furosemide (LASIX) 40 MG tablet Take 40 mg by mouth daily as needed for fluid or edema.      hyoscyamine (ANASPAZ) 0.125 MG TBDP disintergrating tablet Place 0.25 mg under the tongue every 4 (four) hours as needed for cramping.     labetalol (NORMODYNE) 200 MG tablet Take 200 mg by mouth 2 (two) times daily.      levothyroxine (SYNTHROID) 88 MCG tablet Take 88 mcg by mouth daily before breakfast.      losartan-hydrochlorothiazide (HYZAAR) 100-25 MG tablet Take 1 tablet by mouth daily.     nystatin (MYCOSTATIN/NYSTOP) powder APPLY TO AFFECTED AREA TWICE A DAY 15 g 3   octreotide (SANDOSTATIN LAR) 30 MG injection Inject 30 mg into the muscle every 28 (twenty-eight) days.      ondansetron (ZOFRAN) 8 MG tablet Take 1 tablet (8 mg total) by mouth 2 (two) times daily as needed for nausea or vomiting.  20 tablet 0   pantoprazole (PROTONIX) 40 MG tablet TAKE 1 TABLET BY MOUTH EVERY DAY 90 tablet 0   potassium chloride SA (KLOR-CON M20) 20 MEQ tablet TAKE 1 TABLET BY MOUTH EVERY DAY 90 tablet 2   traMADol (ULTRAM) 50 MG tablet 0.5 tablet to 1 tablet every 8 hours as needed for pain 30 tablet 0   Vitamin D, Ergocalciferol, (DRISDOL) 1.25 MG (50000 UNIT) CAPS capsule TAKE 1 CAPSULE BY MOUTH ONE TIME PER WEEK 12 capsule 3   No current facility-administered medications for this visit.   Facility-Administered Medications Ordered in Other Visits  Medication Dose Route Frequency Provider Last Rate Last Admin   octreotide (SANDOSTATIN LAR) IM injection 30 mg  30 mg Intramuscular Once [name], MD        PHYSICAL EXAMINATION: ECOG PERFORMANCE STATUS: 1 - Symptomatic but completely ambulatory  BP (!) 137/48 (BP Location: Right Arm, Patient Position: Sitting, Cuff Size: Large)    Pulse 62    Temp 97.7 F (36.5 C) (Tympanic)    Wt 217 lb 3.2 oz (98.5  kg)    SpO2 94%    BMI 38.48 kg/m   Filed Weights   02/04/21 1307  Weight: 217 lb 3.2 oz (98.5 kg)     Physical Exam Constitutional:      Comments: Patient is a wheelchair.  She is alone.  Patient is hard of hearing.  HENT:     Head: Normocephalic and atraumatic.     Mouth/Throat:     Pharynx: No oropharyngeal exudate.  Eyes:     Pupils: Pupils are equal, round, and reactive to light.  Cardiovascular:     Rate and Rhythm: Normal rate and regular rhythm.  Pulmonary:     Effort: Pulmonary effort is normal. No respiratory distress.     Breath sounds: Normal breath sounds. No wheezing.  Abdominal:     General: Bowel sounds are normal. There is no distension.     Palpations: Abdomen is soft. There is no mass.     Tenderness: There is no abdominal tenderness. There is no guarding or rebound.  Musculoskeletal:        General: Swelling present. No tenderness. Normal range of motion.     Cervical back: Normal range of motion and neck supple.  Skin:    General: Skin is warm.  Neurological:     Mental Status: She is alert and oriented to person, place, and time.  Psychiatric:        Mood and Affect: Affect normal.    LABORATORY DATA:  I have reviewed the data as listed    Component Value Date/Time   NA 138 02/04/2021 1240   NA 137 07/12/2013 0841   K 3.5 02/04/2021 1240   K 4.7 07/12/2013 0841   CL 105 02/04/2021 1240   CL 107 07/12/2013 0841   CO2 25 02/04/2021 1240   CO2 20 (L) 07/12/2013 0841   GLUCOSE 144 (H) 02/04/2021 1240   GLUCOSE 122 (H) 07/12/2013 0841   BUN 20 02/04/2021 1240   BUN 18 07/12/2013 0841   CREATININE 0.86 02/04/2021 1240   CREATININE 0.64 05/22/2014 1111   CALCIUM 8.9 02/04/2021 1240   CALCIUM 9.2 07/12/2013 0841   PROT 6.8 02/04/2021 1240   PROT 7.0 05/22/2014 1111   ALBUMIN 3.8 02/04/2021 1240   ALBUMIN 4.2 05/22/2014 1111   AST 25 02/04/2021 1240   AST 29 05/22/2014 1111   ALT 18 02/04/2021 1240   ALT 17 05/22/2014 1111  ALKPHOS 72  02/04/2021 1240   ALKPHOS 95 05/22/2014 1111   BILITOT 0.7 02/04/2021 1240   BILITOT 0.8 05/22/2014 1111   GFRNONAA >60 02/04/2021 1240   GFRNONAA >60 05/22/2014 1111   GFRAA >60 10/07/2019 1319   GFRAA >60 05/22/2014 1111    No results found for: SPEP, UPEP  Lab Results  Component Value Date   WBC 5.2 02/04/2021   NEUTROABS 3.7 02/04/2021   HGB 11.2 (L) 02/04/2021   HCT 32.9 (L) 02/04/2021   MCV 98.2 02/04/2021   PLT 149 (L) 02/04/2021      Chemistry      Component Value Date/Time   NA 138 02/04/2021 1240   NA 137 07/12/2013 0841   K 3.5 02/04/2021 1240   K 4.7 07/12/2013 0841   CL 105 02/04/2021 1240   CL 107 07/12/2013 0841   CO2 25 02/04/2021 1240   CO2 20 (L) 07/12/2013 0841   BUN 20 02/04/2021 1240   BUN 18 07/12/2013 0841   CREATININE 0.86 02/04/2021 1240   CREATININE 0.64 05/22/2014 1111      Component Value Date/Time   CALCIUM 8.9 02/04/2021 1240   CALCIUM 9.2 07/12/2013 0841   ALKPHOS 72 02/04/2021 1240   ALKPHOS 95 05/22/2014 1111   AST 25 02/04/2021 1240   AST 29 05/22/2014 1111   ALT 18 02/04/2021 1240   ALT 17 05/22/2014 1111   BILITOT 0.7 02/04/2021 1240   BILITOT 0.8 05/22/2014 1111       RADIOGRAPHIC STUDIES: I have personally reviewed the radiological images as listed and agreed with the findings in the report. No results found.   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  October 2022-PET scan shows no evidence of progression of disease or any evidence of new disease; liver metastases-slight decrease in size.  #Continue Sandostatin q M. Labs today reviewed;  acceptable for treatment today.   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  Hemoglobin 11.2- ?  Secondary Lutathera- STABLE; Hold off venofer for now.   # chronic mild diarrhea- ? Sec to carcinoid [not on xarmelo  secondary insurance issues- -STABLE.   ## Palliative care evaluation: Introduced palliative care philosophy and services. I think is reasonable to discuss advance care directives in the context of incurable disease.  Patient is interested; await evaluation with Josh today.  Discussed with Josh.  # DISPOSITION- #  Ok with Sandostatin today;HOLD venofer # sandostatin in 1 months # Follow up in 2  months MD cbc/cmp/chromogranin- -Sandstatin; possible venofer- Dr.B    Orders Placed This Encounter  Procedures   CBC with Differential/Platelet    Standing Status:   Future    Standing Expiration Date:   02/04/2022   Comprehensive metabolic panel    Standing Status:   Future    Standing Expiration Date:   02/04/2022   Chromogranin A    Standing Status:   Future    Standing Expiration Date:   02/04/2022    All questions were answered. The patient knows to call the clinic with any problems, questions or concerns.      [name], MD 02/04/2021 2:12 PM

## 2021-02-05 LAB — CHROMOGRANIN A: Chromogranin A (ng/mL): 93.6 ng/mL (ref 0.0–101.8)

## 2021-02-15 ENCOUNTER — Other Ambulatory Visit: Payer: Self-pay | Admitting: Internal Medicine

## 2021-02-15 NOTE — Telephone Encounter (Signed)
°  Component Ref Range & Units 11 d ago (02/04/21) 2 mo ago (12/10/20) 3 mo ago (11/12/20) 4 mo ago (10/03/20) 5 mo ago (09/05/20) 6 mo ago (08/08/20) 6 mo ago (07/25/20)  Potassium 3.5 - 5.1 mmol/L 3.5  3.3 Low   3.7  3.2 Low   3.5  3.4 Low   3.7

## 2021-03-04 ENCOUNTER — Other Ambulatory Visit: Payer: Self-pay

## 2021-03-04 ENCOUNTER — Inpatient Hospital Stay: Payer: Medicare Other | Attending: Internal Medicine

## 2021-03-04 DIAGNOSIS — E340 Carcinoid syndrome: Secondary | ICD-10-CM | POA: Insufficient documentation

## 2021-03-04 DIAGNOSIS — C7A098 Malignant carcinoid tumors of other sites: Secondary | ICD-10-CM | POA: Diagnosis not present

## 2021-03-04 DIAGNOSIS — K6389 Other specified diseases of intestine: Secondary | ICD-10-CM

## 2021-03-04 DIAGNOSIS — C7B02 Secondary carcinoid tumors of liver: Secondary | ICD-10-CM | POA: Insufficient documentation

## 2021-03-04 MED ORDER — OCTREOTIDE ACETATE 30 MG IM KIT
30.0000 mg | PACK | Freq: Once | INTRAMUSCULAR | Status: AC
  Administered 2021-03-04: 30 mg via INTRAMUSCULAR
  Filled 2021-03-04: qty 1

## 2021-03-30 ENCOUNTER — Other Ambulatory Visit: Payer: Self-pay

## 2021-03-30 ENCOUNTER — Inpatient Hospital Stay
Admission: EM | Admit: 2021-03-30 | Discharge: 2021-04-01 | DRG: 309 | Disposition: A | Discharge status: Home / Self Care | Payer: Medicare Other | Attending: Internal Medicine | Admitting: Internal Medicine

## 2021-03-30 ENCOUNTER — Encounter: Payer: Self-pay | Admitting: Radiology

## 2021-03-30 ENCOUNTER — Emergency Department: Payer: Medicare Other

## 2021-03-30 DIAGNOSIS — F419 Anxiety disorder, unspecified: Secondary | ICD-10-CM | POA: Diagnosis present

## 2021-03-30 DIAGNOSIS — I480 Paroxysmal atrial fibrillation: Principal | ICD-10-CM | POA: Diagnosis present

## 2021-03-30 DIAGNOSIS — C787 Secondary malignant neoplasm of liver and intrahepatic bile duct: Secondary | ICD-10-CM | POA: Diagnosis present

## 2021-03-30 DIAGNOSIS — Z6834 Body mass index (BMI) 34.0-34.9, adult: Secondary | ICD-10-CM

## 2021-03-30 DIAGNOSIS — N182 Chronic kidney disease, stage 2 (mild): Secondary | ICD-10-CM | POA: Diagnosis present

## 2021-03-30 DIAGNOSIS — Z9181 History of falling: Secondary | ICD-10-CM

## 2021-03-30 DIAGNOSIS — I248 Other forms of acute ischemic heart disease: Secondary | ICD-10-CM | POA: Diagnosis present

## 2021-03-30 DIAGNOSIS — R072 Precordial pain: Secondary | ICD-10-CM

## 2021-03-30 DIAGNOSIS — I4891 Unspecified atrial fibrillation: Secondary | ICD-10-CM | POA: Diagnosis not present

## 2021-03-30 DIAGNOSIS — E669 Obesity, unspecified: Secondary | ICD-10-CM | POA: Diagnosis present

## 2021-03-30 DIAGNOSIS — Z20822 Contact with and (suspected) exposure to covid-19: Secondary | ICD-10-CM | POA: Diagnosis present

## 2021-03-30 DIAGNOSIS — D500 Iron deficiency anemia secondary to blood loss (chronic): Secondary | ICD-10-CM | POA: Diagnosis present

## 2021-03-30 DIAGNOSIS — Z79890 Hormone replacement therapy (postmenopausal): Secondary | ICD-10-CM

## 2021-03-30 DIAGNOSIS — W19XXXA Unspecified fall, initial encounter: Secondary | ICD-10-CM | POA: Diagnosis present

## 2021-03-30 DIAGNOSIS — E876 Hypokalemia: Secondary | ICD-10-CM | POA: Diagnosis present

## 2021-03-30 DIAGNOSIS — N184 Chronic kidney disease, stage 4 (severe): Secondary | ICD-10-CM | POA: Diagnosis not present

## 2021-03-30 DIAGNOSIS — I5032 Chronic diastolic (congestive) heart failure: Secondary | ICD-10-CM | POA: Diagnosis present

## 2021-03-30 DIAGNOSIS — C7A098 Malignant carcinoid tumors of other sites: Secondary | ICD-10-CM | POA: Diagnosis present

## 2021-03-30 DIAGNOSIS — H9190 Unspecified hearing loss, unspecified ear: Secondary | ICD-10-CM | POA: Diagnosis present

## 2021-03-30 DIAGNOSIS — I10 Essential (primary) hypertension: Secondary | ICD-10-CM | POA: Diagnosis present

## 2021-03-30 DIAGNOSIS — C7B02 Secondary carcinoid tumors of liver: Secondary | ICD-10-CM | POA: Diagnosis present

## 2021-03-30 DIAGNOSIS — N189 Chronic kidney disease, unspecified: Secondary | ICD-10-CM | POA: Diagnosis present

## 2021-03-30 DIAGNOSIS — R079 Chest pain, unspecified: Secondary | ICD-10-CM | POA: Diagnosis present

## 2021-03-30 DIAGNOSIS — Z86718 Personal history of other venous thrombosis and embolism: Secondary | ICD-10-CM

## 2021-03-30 DIAGNOSIS — Z7982 Long term (current) use of aspirin: Secondary | ICD-10-CM

## 2021-03-30 DIAGNOSIS — Z882 Allergy status to sulfonamides status: Secondary | ICD-10-CM

## 2021-03-30 DIAGNOSIS — Z79899 Other long term (current) drug therapy: Secondary | ICD-10-CM

## 2021-03-30 DIAGNOSIS — Z8042 Family history of malignant neoplasm of prostate: Secondary | ICD-10-CM

## 2021-03-30 DIAGNOSIS — Z803 Family history of malignant neoplasm of breast: Secondary | ICD-10-CM

## 2021-03-30 DIAGNOSIS — Z853 Personal history of malignant neoplasm of breast: Secondary | ICD-10-CM

## 2021-03-30 DIAGNOSIS — E039 Hypothyroidism, unspecified: Secondary | ICD-10-CM | POA: Diagnosis present

## 2021-03-30 DIAGNOSIS — I130 Hypertensive heart and chronic kidney disease with heart failure and stage 1 through stage 4 chronic kidney disease, or unspecified chronic kidney disease: Secondary | ICD-10-CM | POA: Diagnosis present

## 2021-03-30 DIAGNOSIS — K219 Gastro-esophageal reflux disease without esophagitis: Secondary | ICD-10-CM | POA: Diagnosis present

## 2021-03-30 DIAGNOSIS — Z800 Family history of malignant neoplasm of digestive organs: Secondary | ICD-10-CM

## 2021-03-30 LAB — BRAIN NATRIURETIC PEPTIDE: B Natriuretic Peptide: 657.1 pg/mL — ABNORMAL HIGH (ref 0.0–100.0)

## 2021-03-30 LAB — CBC
HCT: 35.6 % — ABNORMAL LOW (ref 36.0–46.0)
Hemoglobin: 11.4 g/dL — ABNORMAL LOW (ref 12.0–15.0)
MCH: 31.4 pg (ref 26.0–34.0)
MCHC: 32 g/dL (ref 30.0–36.0)
MCV: 98.1 fL (ref 80.0–100.0)
Platelets: 151 10*3/uL (ref 150–400)
RBC: 3.63 MIL/uL — ABNORMAL LOW (ref 3.87–5.11)
RDW: 13.7 % (ref 11.5–15.5)
WBC: 5.5 10*3/uL (ref 4.0–10.5)
nRBC: 0 % (ref 0.0–0.2)

## 2021-03-30 LAB — COMPREHENSIVE METABOLIC PANEL
ALT: 14 U/L (ref 0–44)
AST: 23 U/L (ref 15–41)
Albumin: 3.1 g/dL — ABNORMAL LOW (ref 3.5–5.0)
Alkaline Phosphatase: 76 U/L (ref 38–126)
Anion gap: 7 (ref 5–15)
BUN: 27 mg/dL — ABNORMAL HIGH (ref 8–23)
CO2: 23 mmol/L (ref 22–32)
Calcium: 8.9 mg/dL (ref 8.9–10.3)
Chloride: 108 mmol/L (ref 98–111)
Creatinine, Ser: 0.86 mg/dL (ref 0.44–1.00)
GFR, Estimated: >60 mL/min (ref >60)
Glucose, Bld: 110 mg/dL — ABNORMAL HIGH (ref 70–99)
Potassium: 3.7 mmol/L (ref 3.5–5.1)
Sodium: 138 mmol/L (ref 135–145)
Total Bilirubin: 0.7 mg/dL (ref 0.3–1.2)
Total Protein: 6.2 g/dL — ABNORMAL LOW (ref 6.5–8.1)

## 2021-03-30 LAB — TROPONIN I (HIGH SENSITIVITY)
Troponin I (High Sensitivity): 19 ng/L — ABNORMAL HIGH (ref <18)
Troponin I (High Sensitivity): 19 ng/L — ABNORMAL HIGH (ref <18)

## 2021-03-30 LAB — RESP PANEL BY RT-PCR (FLU A&B, COVID) ARPGX2
Influenza A by PCR: NEGATIVE
Influenza B by PCR: NEGATIVE
SARS Coronavirus 2 by RT PCR: NEGATIVE

## 2021-03-30 MED ORDER — ONDANSETRON HCL 4 MG PO TABS: 8.0000 mg | ORAL_TABLET | Freq: Two times a day (BID) | ORAL | Status: DC | PRN

## 2021-03-30 MED ORDER — DILTIAZEM HCL 25 MG/5ML IV SOLN
10.0000 mg | Freq: Once | INTRAVENOUS | Status: AC
  Administered 2021-03-30: 10 mg via INTRAVENOUS
  Filled 2021-03-30: qty 5

## 2021-03-30 MED ORDER — TRAMADOL HCL 50 MG PO TABS: 50.0000 mg | ORAL_TABLET | Freq: Two times a day (BID) | ORAL | Status: DC | PRN

## 2021-03-30 MED ORDER — ACETAMINOPHEN 325 MG PO TABS: 650.0000 mg | ORAL_TABLET | Freq: Four times a day (QID) | ORAL | Status: DC | PRN

## 2021-03-30 MED ORDER — NICOTINE 14 MG/24HR TD PT24
14.0000 mg | MEDICATED_PATCH | Freq: Every day | TRANSDERMAL | Status: DC
  Administered 2021-03-31 – 2021-04-01 (×3): 14 mg via TRANSDERMAL
  Filled 2021-03-30 (×3): qty 1

## 2021-03-30 MED ORDER — APIXABAN 2.5 MG PO TABS: 2.5000 mg | ORAL_TABLET | Freq: Two times a day (BID) | ORAL | Status: DC

## 2021-03-30 MED ORDER — HYOSCYAMINE SULFATE 0.125 MG PO TBDP: 0.2500 mg | ORAL_TABLET | ORAL | Status: DC | PRN

## 2021-03-30 MED ORDER — LEVOTHYROXINE SODIUM 88 MCG PO TABS
88.0000 ug | ORAL_TABLET | Freq: Every day | ORAL | Status: DC
  Administered 2021-03-31 – 2021-04-01 (×2): 88 ug via ORAL
  Filled 2021-03-30 (×3): qty 1

## 2021-03-30 MED ORDER — PANTOPRAZOLE SODIUM 40 MG PO TBEC: 40.0000 mg | DELAYED_RELEASE_TABLET | Freq: Every day | ORAL | Status: DC

## 2021-03-30 MED ORDER — SODIUM CHLORIDE 0.9 % IV SOLN: 250.0000 mL | INTRAVENOUS | Status: DC | PRN

## 2021-03-30 MED ORDER — SODIUM CHLORIDE 0.9% FLUSH
3.0000 mL | Freq: Two times a day (BID) | INTRAVENOUS | Status: DC
  Administered 2021-03-31 – 2021-04-01 (×4): 3 mL via INTRAVENOUS

## 2021-03-30 MED ORDER — DILTIAZEM HCL-DEXTROSE 125-5 MG/125ML-% IV SOLN (PREMIX)
5.0000 mg/h | INTRAVENOUS | Status: DC
  Administered 2021-03-30: 5 mg/h via INTRAVENOUS
  Filled 2021-03-30: qty 125

## 2021-03-30 MED ORDER — SODIUM CHLORIDE 0.9% FLUSH: 3.0000 mL | INTRAVENOUS | Status: DC | PRN

## 2021-03-30 MED ORDER — ACETAMINOPHEN 650 MG RE SUPP: 650.0000 mg | Freq: Four times a day (QID) | RECTAL | Status: DC | PRN

## 2021-03-30 NOTE — ED Provider Notes (Addendum)
? ?[facility] ?Provider Note ? ? ? Event Date/Time  ? First MD Initiated Contact with Patient 03/30/21 1901   ?  (approximate) ? ?History  ? ?Chief Complaint: Tachycardia and Chest Pain ? ?HPI ? ?Emma [name] is a 81 y.o. female with a past medical history of CHF, hypertension, presents to the emergency department for chest discomfort.  According to the patient last night and again this morning she was experiencing palpitations such as her heart racing and felt a sensation of chest pressure and nausea.  Patient states her symptoms seem to improve overnight but then returned again today so the patient came to the emergency department for evaluation.  Patient appeared to be in atrial fibrillation around 130 bpm or so with EMS.  Patient does not believe she has a history of atrial fibrillation.  Patient denies any chest pain currently.  Denies any current shortness of breath or nausea. ? ?Physical Exam  ? ?Triage Vital Signs: ?ED Triage Vitals [03/30/21 1854]  ?Enc Vitals Group  ?   BP (!) 134/94  ?   Pulse Rate (!) 130  ?   Resp 18  ?   Temp 98 ?F (36.7 ?C)  ?   Temp Source Oral  ?   SpO2 96 %  ?   Weight   ?   Height   ?   Head Circumference   ?   Peak Flow   ?   Pain Score 0  ?   Pain Loc   ?   Pain Edu?   ?   Excl. in [place]?   ? ? ?Most recent vital signs: ?Vitals:  ? 03/30/21 1854 03/30/21 1900  ?BP: (!) 134/94 125/69  ?Pulse: (!) 130 (!) 105  ?Resp: 18 11  ?Temp: 98 ?F (36.7 ?C)   ?SpO2: 96% 97%  ? ? ?General: Awake, no distress.  ?CV:  Good peripheral perfusion.  Irregular rhythm rate around 130 bpm ?Resp:  Normal effort.  Equal breath sounds bilaterally.  ?Abd:  No distention.  Soft, nontender.  No rebound or guarding. ?Other:  Patient is extremely hard of hearing. ? ? ?ED Results / Procedures / Treatments  ? ?EKG ? ?EKG viewed and interpreted by myself shows atrial fibrillation at 113 bpm with a narrow QRS, normal axis, normal intervals, nonspecific ST changes. ? ?RADIOLOGY ? ?I  personally reviewed the chest x-ray images, no acute abnormality seen on my evaluation. ?Chest x-ray shows no acute abnormality ? ? ?MEDICATIONS ORDERED IN ED: ?Medications - No data to display ? ? ?IMPRESSION / MDM / ASSESSMENT AND PLAN / ED COURSE  ?I reviewed the triage vital signs and the nursing notes. ? ?Patient presents emergency department for heart palpitations chest pressure as well as mild nausea last night.  Patient felt the palpitations and pressure once again today so she came to the emergency department for evaluation.  Patient is EKG appears to show atrial fibrillation which appears to be new compared to her last EKG from 09/15/2017.  I reviewed a recent cardiology note from 03/14/2021 with Dr. Saralyn Pilar no sign of atrial fibrillation at that time either.  Given the new onset of atrial fibrillation we will dose diltiazem and continue to closely monitor.  Heart rate currently around 100 to 110 bpm.  We will check labs, chest x-ray and continue to closely monitor.   ? ?Patient's labs have resulted showing an elevated BNP of 650, troponin is elevated and 19 we will recheck a repeat troponin.  CBC is largely reassuring, chemistry is largely reassuring. ? ?Patient has received diltiazem, heart rate went from approximately 120 down to 110, is now back up to 120-130.  We will start the patient on diltiazem infusion given her chest pain with new onset atrial fibrillation with rapid ventricular response.  We will admit to the hospital service. ? ?CRITICAL CARE ?Performed by: Harvest Dark ? ? ?Total critical care time: 30 minutes ? ?Critical care time was exclusive of separately billable procedures and treating other patients. ? ?Critical care was necessary to treat or prevent imminent or life-threatening deterioration. ? ?Critical care was time spent personally by me on the following activities: development of treatment plan with patient and/or surrogate as well as nursing, discussions with consultants,  evaluation of patient's response to treatment, examination of patient, obtaining history from patient or surrogate, ordering and performing treatments and interventions, ordering and review of laboratory studies, ordering and review of radiographic studies, pulse oximetry and re-evaluation of patient's condition. ? ? ?FINAL CLINICAL IMPRESSION(S) / ED DIAGNOSES  ? ?New onset atrial fibrillation with rapid ventricular response ?Chest pain ? ? ?Note:  This document was prepared using Dragon voice recognition software and may include unintentional dictation errors. ?  Harvest Dark, MD ?03/30/21 2239 ? ?  ?Harvest Dark, MD ?03/30/21 2251 ? ?

## 2021-03-30 NOTE — ED Triage Notes (Addendum)
Pt BIB EMS from home. Hx of colon cancer undergoing treatment. Called out for shob but pt is not short of breath but does endorse CP  Pt had atrial fib with EMS.  No hx of same. States she had a cardiology visit recently that was unremarkable.  Pt is HOH.  HR 130s on arrival.  All other VSS.  Pt states "ive took so many baby aspirin today I don't even know how many Ive took" ?

## 2021-03-30 NOTE — H&P (Addendum)
History and Physical    Patient: Emma [name] LXB:[id number] DOB: May 27, 1940 DOA: 03/30/2021 DOS: the patient was seen and examined on 03/31/2021 PCP: [name], MD  Patient coming from: Home  Chief Complaint:  Chief Complaint  Patient presents with   Tachycardia   Chest Pain    HPI: Emma [name] is a 81 y.o. female with medical history significant of CHF, Cancer,GERD, DVT, HTN, coming to [place] today with chest pain. Pt is hearing impaired and HPI is limited.   Chest pain started yesterday night. Pt also reports that she had abd pain and nausea along with fluttering. Pt saw cardiologist and told him she was fine.  Pt told family today about her chest pain.  Pt reports intermittent rectal bleeding that continued for a day or so and then it resolves. PT does take aleve and other NSAIDS. She does not take ibandronate.  Pt is in a wheelchair and has arthritis in both knees and is able to stand and transfer for short periods and is abel to do her ADL.  Family says she felt her heart feeling funny about two weeks ago.  Pt felt faint today and kept c/o feeling funny.  Pt lives with daughter and husband and granddaughter.  Pt has broken both legs in past from standing.  Pt does not like to take her fluid pill and her osteoporosis medicine.  In ed pt found ot be in a.fib rvr and was given diltiazem iv and then required starting her in IV infusion due to recurrence and persistence of a.fib rvr.  Advised pt to stop all NSAIDS, including her baby aspirin and d/w her about risk of bleeding and stroke due to her a.fib. pt verbalized understanding after daughter explained with with me clearly with no question patient states she does not want to have a stroke.PT states she will start blood thinner. Also recommended that she get another option for her osteoporosis as she is having intermittent rectal bleeding and intolerance to bisphosphonate therapy as they tend to increase bleeding risk.Pt to d/w  her cardiologist for other options for her a.fib for anticoagulation.    Review of Systems:Review of Systems  Respiratory:  Positive for shortness of breath.   Cardiovascular:  Positive for chest pain, palpitations and leg swelling.   Past Medical History:  Diagnosis Date   Aromatase inhibitor use    Breast cancer ([place]) 2014   left breast cancer/radiation   Breast cancer, left breast ([place]) 06/19/2014   CHF (congestive heart failure) (HCC)    recent sepsis   Chronic kidney disease    Dizziness    Dyspnea    recently while admitted   GERD (gastroesophageal reflux disease)    Hearing loss    History of hiatal hernia    History of kidney stones    HOH (hard of hearing)    severe   Hypertension    Hypothyroidism    Leg DVT (deep venous thromboembolism), acute ([place]) 07/2015   left leg after fracture   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   Pain    chest pain while admitted   Personal history of radiation therapy 2015   left breast ca   Thyroid disease    Past Surgical History:  Procedure Laterality Date   ABDOMINAL HYSTERECTOMY     partial   BREAST LUMPECTOMY Left 01/13/2013   invasive mammary carcinoma, clear margins, LN negative   BREAST LUMPECTOMY WITH SENTINEL LYMPH NODE BIOPSY Left 2014   CHOLECYSTECTOMY  COLONOSCOPY WITH PROPOFOL N/A 09/17/2017   Procedure: COLONOSCOPY WITH PROPOFOL;  Surgeon: Jonathon Bellows, MD;  Location: [facility] ENDOSCOPY;  Service: Gastroenterology;  Laterality: N/A;   CYSTOSCOPY W/ URETERAL STENT PLACEMENT Right 01/07/2016   Procedure: CYSTOSCOPY WITH STENT REPLACEMENT;  Surgeon: Hollice Espy, MD;  Location: [facility] ORS;  Service: Urology;  Laterality: Right;   CYSTOSCOPY WITH RETROGRADE PYELOGRAM, URETEROSCOPY AND STENT PLACEMENT Right 12/19/2015   Procedure: CYSTOSCOPY WITH RETROGRADE PYELOGRAM, URETEROSCOPY AND STENT PLACEMENT;  Surgeon: Ardis Hughs, MD;  Location: [facility] ORS;  Service: Urology;  Laterality: Right;   URETEROSCOPY WITH HOLMIUM LASER  LITHOTRIPSY Right 01/07/2016   Procedure: URETEROSCOPY WITH HOLMIUM LASER LITHOTRIPSY;  Surgeon: Hollice Espy, MD;  Location: [facility] ORS;  Service: Urology;  Laterality: Right;   Social History:  reports that she has never smoked. She has never used smokeless tobacco. She reports that she does not drink alcohol and does not use drugs.  Allergies  Allergen Reactions   Sulfa Antibiotics Other (See Comments)    Other reaction(s): Kidney Disorder    Family History  Problem Relation Age of Onset   Breast cancer Mother 30       deceased 73   Breast cancer Maternal Aunt 61       deceased 20s   Prostate cancer Father        deceased 28   Colon cancer Paternal Aunt 77       deceased 22s    Prior to Admission medications   Medication Sig Start Date End Date Taking? Authorizing Provider  Calcium Carb-Cholecalciferol (CALCIUM 600 + D PO) Take 1 tablet by mouth daily.    [provider]  furosemide (LASIX) 40 MG tablet Take 40 mg by mouth daily as needed for fluid or edema.     [provider]  hyoscyamine (ANASPAZ) 0.125 MG TBDP disintergrating tablet Place 0.25 mg under the tongue every 4 (four) hours as needed for cramping.    [provider]  labetalol (NORMODYNE) 200 MG tablet Take 200 mg by mouth 2 (two) times daily.     [provider]  levothyroxine (SYNTHROID) 88 MCG tablet Take 88 mcg by mouth daily before breakfast.  06/20/14   [provider]  losartan-hydrochlorothiazide (HYZAAR) 100-25 MG tablet Take 1 tablet by mouth daily.    [provider]  nystatin (MYCOSTATIN/NYSTOP) powder APPLY TO AFFECTED AREA TWICE A DAY 01/18/18   [name], MD  octreotide (SANDOSTATIN LAR) 30 MG injection Inject 30 mg into the muscle every 28 (twenty-eight) days.     [provider]  ondansetron (ZOFRAN) 8 MG tablet Take 1 tablet (8 mg total) by mouth 2 (two) times daily as needed for nausea or vomiting. 08/08/20   [name], MD   pantoprazole (PROTONIX) 40 MG tablet TAKE 1 TABLET BY MOUTH EVERY DAY 12/05/20   [name], MD  potassium chloride SA (KLOR-CON M20) 20 MEQ tablet TAKE 1 TABLET BY MOUTH EVERY DAY 02/15/21   [name], MD  traMADol (ULTRAM) 50 MG tablet 0.5 tablet to 1 tablet every 8 hours as needed for pain 01/17/20   [name], MD  Vitamin D, Ergocalciferol, (DRISDOL) 1.25 MG (50000 UNIT) CAPS capsule TAKE 1 CAPSULE BY MOUTH ONE TIME PER WEEK 01/13/20   [name], MD    Physical Exam: Vitals:   03/31/21 0030 03/31/21 0045 03/31/21 0100 03/31/21 0132  BP: 122/60  127/67 132/83  Pulse: 96 (!) 52 76 71  Resp: 17 (!)  $'8 11 17  't$ Temp:    98 F (36.7 C)  TempSrc:    Oral  SpO2: 96% 95% 98% 98%  Weight:      Height:      Physical Exam Vitals and nursing note reviewed.  Constitutional:      General: She is not in acute distress.    Appearance: Normal appearance. She is obese. She is not ill-appearing, toxic-appearing or diaphoretic.  HENT:     Head: Normocephalic and atraumatic.     Right Ear: Hearing and external ear normal.     Left Ear: Hearing and external ear normal.     Nose: Nose normal. No nasal deformity.     Mouth/Throat:     Lips: Pink.     Mouth: Mucous membranes are moist.     Tongue: No lesions.     Pharynx: Oropharynx is clear.  Eyes:     Extraocular Movements: Extraocular movements intact.     Pupils: Pupils are equal, round, and reactive to light.  Neck:     Vascular: No carotid bruit or JVD.  Cardiovascular:     Rate and Rhythm: Normal rate. Rhythm irregular.     Pulses: Normal pulses.          Dorsalis pedis pulses are 2+ on the right side and 2+ on the left side.       Posterior tibial pulses are 2+ on the right side and 2+ on the left side.     Heart sounds: Normal heart sounds.  Pulmonary:     Effort: Pulmonary effort is normal. No tachypnea or respiratory distress.     Breath sounds: Normal breath sounds.  Abdominal:      General: Bowel sounds are normal. There is no distension.     Palpations: Abdomen is soft. There is no mass.     Tenderness: There is no abdominal tenderness. There is no guarding.     Hernia: No hernia is present.  Musculoskeletal:     Right lower leg: Edema present.     Left lower leg: Edema present.  Skin:    General: Skin is warm.  Neurological:     General: No focal deficit present.     Mental Status: She is alert and oriented to person, place, and time.     Cranial Nerves: Cranial nerves 2-12 are intact.     Motor: Motor function is intact.  Psychiatric:        Attention and Perception: Attention normal.        Mood and Affect: Mood normal.        Speech: Speech normal.        Behavior: Behavior normal. Behavior is cooperative.        Cognition and Memory: Cognition normal.   Data Reviewed: CMP shows normal kidney/ liver and electrolytes. BNP of 657.1 Troponin 19/19. CBC shows hemoglobin of 11.4 and normal wbc / platelet. Respiratory panel neg for flu and COVID.  Chest xray negative for any CP abnormality.   EKG shows a.fib rvr at 113 with normal interval and axis.   > 12/25/2015 2 d echocardiogram: Study Result                  *[place]  [place],  [address]                             [phone]   -------------------------------------------------------------------  Transthoracic Echocardiography   Patient:    Seba, Madole  MR #:       [id number]  Study Date: 12/25/2015  Gender:     F  Age:        89  Height:     165.1 cm  Weight:     89.9 kg  BSA:        2.07 m^2  Pt. Status:  Room:       [place], Sital P   ADMITTING    Hower, Aaron Mose   ORDERING     Hower, Aaron Mose   REFERRING    Hower, Aaron Mose   SONOGRAPHER  Sonia Side Hege RDCS   PERFORMING   [facility]   cc:   -------------------------------------------------------------------   LV EF: 50% -   55%   -------------------------------------------------------------------  Indications:      CHF-428.0.   -------------------------------------------------------------------  History:   PMH:  Aromatase inhibitor use, Breast cancer, thyroid  disease, GERD.  Risk factors:  Hypertension.   -------------------------------------------------------------------  Study Conclusions   - Left ventricle: The cavity size was normal. Systolic function was    normal. The estimated ejection fraction was in the range of 50%    to 55%.  - Aortic valve: Valve area (Vmax): 2.58 cm^2.   -------------------------------------------------------------------  Study data:   Study status:  Routine.  Procedure:  The patient  reported no pain pre or post test.          Transthoracic  echocardiography.  M-mode, complete 2D, spectral Doppler, and color  Doppler.  Birthdate:  Patient birthdate: 1941-01-04.  Age:  Patient  is 80 yr old.  Sex:  Gender: female.    BMI: 33 kg/m^2.  Blood  pressure:     129/54  Patient status:  Inpatient.  Study date:  Study date: 12/25/2015. Study time: 03:00 PM.  Location:  Bedside.     -------------------------------------------------------------------   -------------------------------------------------------------------  Left ventricle:  The cavity size was normal. Systolic function was  normal. The estimated ejection fraction was in the range of 50% to  55%.   -------------------------------------------------------------------  Aortic valve:   Structurally normal valve.   Cusp separation was  normal.  Doppler:  Transvalvular velocity was within the normal  range. There was no stenosis. There was no regurgitation.    Peak  velocity ratio of LVOT to aortic valve: 0.75. Valve area (Vmax):  2.58 cm^2. Indexed valve area (Vmax): 1.25 cm^2/m^2.    Peak  gradient (S): 6 mm Hg.   -------------------------------------------------------------------  Aorta:  The aorta  was normal, not dilated, and non-diseased.   -------------------------------------------------------------------  Mitral valve:   Structurally normal valve.   Leaflet separation was  normal.  Doppler:  Transvalvular velocity was within the normal  range. There was no evidence for stenosis. There was no  regurgitation.   -------------------------------------------------------------------  Left atrium:  The atrium was normal in size.   -------------------------------------------------------------------  Right ventricle:  The cavity size was normal. Wall thickness was  normal. Systolic function was normal.   -------------------------------------------------------------------  Tricuspid valve:   Structurally normal valve.   Leaflet separation  was normal.  Doppler:  Transvalvular velocity was within the normal  range. There was no regurgitation.   -------------------------------------------------------------------  Right atrium:  The atrium was normal in size.     -------------------------------------------------------------------  Post procedure conclusions  Ascending Aorta:   - The aorta was normal, not dilated, and non-diseased.   -------------------------------------------------------------------  Measurements    Left ventricle                            Value          Reference   LV ID, ED, PLAX chordal           (L)     40.8  mm       43 - 52   LV ID, ES, PLAX chordal                   25.2  mm       23 - 38   LV fx shortening, PLAX chordal            38    %        >=29   LV PW thickness, ED                       16.3  mm       ---------   IVS/LV PW ratio, ED                       1.12           <=1.3   LV e&', lateral                            4.79  cm/s     ---------   LV E/e&', lateral                          12.55          ---------   LV e&', medial                             4.24  cm/s     ---------   LV E/e&', medial                           14.17           ---------   LV e&', average                            4.52  cm/s     ---------   LV E/e&', average                          13.31          ---------      Ventricular septum                        Value          Reference   IVS thickness, ED                         18.3  mm       ---------      LVOT  Value          Reference   LVOT ID, S                                21    mm       ---------   LVOT area                                 3.46  cm^2     ---------   LVOT peak velocity, S                     94.1  cm/s     ---------      Aortic valve                              Value          Reference   Aortic valve peak velocity, S             126   cm/s     ---------   Aortic peak gradient, S                   6     mm Hg    ---------   Velocity ratio, peak, LVOT/AV             0.75           ---------   Aortic valve area, peak velocity          2.58  cm^2     ---------   Aortic valve area/bsa, peak               1.25  cm^2/m^2 ---------   velocity      Aorta                                     Value          Reference   Aortic root ID, ED                        30    mm       ---------      Left atrium                               Value          Reference   LA ID, A-P, ES                            59    mm       ---------   LA ID/bsa, A-P                    (H)     2.86  cm/m^2   <=2.2   LA volume, ES, 1-p A4C                    91.1  ml       ---------   LA volume/bsa, ES, 1-p A4C  44.1  ml/m^2   ---------      Mitral valve                              Value          Reference   Mitral E-wave peak velocity               60.1  cm/s     ---------   Mitral A-wave peak velocity               79.1  cm/s     ---------   Mitral deceleration time                  222   ms       150 - 230   Mitral E/A ratio, peak                    0.8            ---------      Right ventricle                           Value          Reference   RV ID,  ED, PLAX                           31.4  mm       19 - 38      Pulmonic valve                            Value          Reference   Pulmonic valve peak velocity, S           82.5  cm/s     ---------   Legend:  (L)  and  (H)  mark values outside specified reference range.   -------------------------------------------------------------------  Prepared and Electronically Authenticated by   Serafina Royals, MD  2017-11-28T16:26:07    Assessment and Plan: * Chest pain -attribute pt's chest pain to increase demand from chf and a.fib rvr.  -troponin is same and ekg is negative for any acute finding.  -we will obtain and echo and no chest pian currently.  PRN nitroglycerine.   Chronic diastolic heart failure (HCC) We will cont with diltiazem and monitor daily intake and output and weights.  We will hold lasix after single dose tonight of 20 mg x 1. We will resume losartan once BP allows and pt is off drip.    Atrial fibrillation with RVR ([place]) Pt has agreed to plan with anticoagulation with eliquis.  Pt has chads score of 7 putting her at moderate to high risk for VTE. We will obtain echo and resume other home meds.   GERD (gastroesophageal reflux disease) Cont pantoprazole.    Iron deficiency anemia due to chronic blood loss We will type and screen/ I VPPI.  Stable h/h otherwise. CBC 03/30/2021 02/04/2021 12/10/2020  WBC 5.5 5.2 5.3  Hemoglobin 11.4(L) 11.2(L) 11.5(L)  Hematocrit 35.6(L) 32.9(L) 33.6(L)  Platelets 151 149(L) 149(L)       Fall Pt  Has h/o falls we obtain pwill monitor with fall precaution.   Anxiety Stable.      HTN (hypertension) Blood pressure 128/84, pulse 94, temperature 98 F (  36.7 C), temperature source Oral, resp. rate 15, height '5\' 5"'$  (1.651 m), weight 95.3 kg, SpO2 100 %. we will hold home regimen of  Hyzaar labetalol and lasix.     Hypothyroidism Continue levothyroxine at 88 mcg.    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  Oncology consult per am team as deemed appropriate.    Chronic kidney disease Lab Results  Component Value Date   CREATININE 0.86 03/30/2021   CREATININE 0.86 02/04/2021   CREATININE 0.81 12/10/2020  resolved we will [place].     Advance Care Planning:   Code Status: Full Code    Consults:  None    Family Communication:  Aurora Mask (Daughter)  [phone] (Mobile)  Severity of Illness: The appropriate patient status for this patient is OBSERVATION. Observation status is judged to be reasonable and necessary in order to provide the required intensity of service to ensure the patient's safety. The patient's presenting symptoms, physical exam findings, and initial radiographic and laboratory data in the context of their medical condition is felt to place them at decreased risk for further clinical deterioration. Furthermore, it is anticipated that the patient will be medically stable for discharge from the hospital within 2 midnights of admission.   Author: Para Skeans, MD 03/31/2021 1:52 AM  For on call review [email or web address].

## 2021-03-30 NOTE — ED Notes (Signed)
Awake, resting comfortably in bed. No complaints of pain or chest pain. Denies SOB. Family reports that patient does not take her "water pill" daily. She waits to take it until her legs are swollen. Discussed with patient that medications should be taken as her MD prescribes them. She verbalized understanding but states that it makes her "piss all over myself." ?

## 2021-03-31 ENCOUNTER — Observation Stay
Admit: 2021-03-31 | Discharge: 2021-03-31 | Disposition: A | Discharge status: Home / Self Care | Payer: Medicare Other | Attending: Internal Medicine | Admitting: Internal Medicine

## 2021-03-31 ENCOUNTER — Encounter: Payer: Self-pay | Admitting: Internal Medicine

## 2021-03-31 DIAGNOSIS — Z882 Allergy status to sulfonamides status: Secondary | ICD-10-CM | POA: Diagnosis not present

## 2021-03-31 DIAGNOSIS — F419 Anxiety disorder, unspecified: Secondary | ICD-10-CM | POA: Diagnosis present

## 2021-03-31 DIAGNOSIS — Z800 Family history of malignant neoplasm of digestive organs: Secondary | ICD-10-CM | POA: Diagnosis not present

## 2021-03-31 DIAGNOSIS — Z9181 History of falling: Secondary | ICD-10-CM | POA: Diagnosis not present

## 2021-03-31 DIAGNOSIS — Z853 Personal history of malignant neoplasm of breast: Secondary | ICD-10-CM | POA: Diagnosis not present

## 2021-03-31 DIAGNOSIS — E669 Obesity, unspecified: Secondary | ICD-10-CM | POA: Diagnosis present

## 2021-03-31 DIAGNOSIS — Z79899 Other long term (current) drug therapy: Secondary | ICD-10-CM | POA: Diagnosis not present

## 2021-03-31 DIAGNOSIS — Z79890 Hormone replacement therapy (postmenopausal): Secondary | ICD-10-CM | POA: Diagnosis not present

## 2021-03-31 DIAGNOSIS — I5032 Chronic diastolic (congestive) heart failure: Secondary | ICD-10-CM | POA: Diagnosis present

## 2021-03-31 DIAGNOSIS — C7B02 Secondary carcinoid tumors of liver: Secondary | ICD-10-CM

## 2021-03-31 DIAGNOSIS — Z8042 Family history of malignant neoplasm of prostate: Secondary | ICD-10-CM | POA: Diagnosis not present

## 2021-03-31 DIAGNOSIS — H9190 Unspecified hearing loss, unspecified ear: Secondary | ICD-10-CM | POA: Diagnosis present

## 2021-03-31 DIAGNOSIS — C7A098 Malignant carcinoid tumors of other sites: Secondary | ICD-10-CM | POA: Diagnosis present

## 2021-03-31 DIAGNOSIS — N189 Chronic kidney disease, unspecified: Secondary | ICD-10-CM | POA: Diagnosis present

## 2021-03-31 DIAGNOSIS — K219 Gastro-esophageal reflux disease without esophagitis: Secondary | ICD-10-CM | POA: Diagnosis present

## 2021-03-31 DIAGNOSIS — Z20822 Contact with and (suspected) exposure to covid-19: Secondary | ICD-10-CM | POA: Diagnosis present

## 2021-03-31 DIAGNOSIS — E876 Hypokalemia: Secondary | ICD-10-CM | POA: Diagnosis present

## 2021-03-31 DIAGNOSIS — I130 Hypertensive heart and chronic kidney disease with heart failure and stage 1 through stage 4 chronic kidney disease, or unspecified chronic kidney disease: Secondary | ICD-10-CM | POA: Diagnosis present

## 2021-03-31 DIAGNOSIS — R079 Chest pain, unspecified: Secondary | ICD-10-CM | POA: Diagnosis present

## 2021-03-31 DIAGNOSIS — N182 Chronic kidney disease, stage 2 (mild): Secondary | ICD-10-CM | POA: Diagnosis present

## 2021-03-31 DIAGNOSIS — I4891 Unspecified atrial fibrillation: Secondary | ICD-10-CM | POA: Diagnosis not present

## 2021-03-31 DIAGNOSIS — C787 Secondary malignant neoplasm of liver and intrahepatic bile duct: Secondary | ICD-10-CM | POA: Diagnosis present

## 2021-03-31 DIAGNOSIS — I248 Other forms of acute ischemic heart disease: Secondary | ICD-10-CM | POA: Diagnosis present

## 2021-03-31 DIAGNOSIS — I480 Paroxysmal atrial fibrillation: Secondary | ICD-10-CM | POA: Diagnosis present

## 2021-03-31 DIAGNOSIS — D500 Iron deficiency anemia secondary to blood loss (chronic): Secondary | ICD-10-CM | POA: Diagnosis present

## 2021-03-31 DIAGNOSIS — Z803 Family history of malignant neoplasm of breast: Secondary | ICD-10-CM | POA: Diagnosis not present

## 2021-03-31 DIAGNOSIS — Z6834 Body mass index (BMI) 34.0-34.9, adult: Secondary | ICD-10-CM | POA: Diagnosis not present

## 2021-03-31 DIAGNOSIS — E039 Hypothyroidism, unspecified: Secondary | ICD-10-CM | POA: Diagnosis present

## 2021-03-31 LAB — CBC
HCT: 33.4 % — ABNORMAL LOW (ref 36.0–46.0)
Hemoglobin: 11.2 g/dL — ABNORMAL LOW (ref 12.0–15.0)
MCH: 31.9 pg (ref 26.0–34.0)
MCHC: 33.5 g/dL (ref 30.0–36.0)
MCV: 95.2 fL (ref 80.0–100.0)
Platelets: 160 10*3/uL (ref 150–400)
RBC: 3.51 MIL/uL — ABNORMAL LOW (ref 3.87–5.11)
RDW: 13.6 % (ref 11.5–15.5)
WBC: 5.4 10*3/uL (ref 4.0–10.5)
nRBC: 0 % (ref 0.0–0.2)

## 2021-03-31 LAB — ECHOCARDIOGRAM COMPLETE
AR max vel: 1.8 cm2
AV Peak grad: 8.3 mmHg
Ao pk vel: 1.44 m/s
Area-P 1/2: 3.93 cm2
Height: 65 in
S' Lateral: 3.88 cm
Weight: 3296 oz

## 2021-03-31 LAB — TSH: TSH: 0.998 u[IU]/mL (ref 0.350–4.500)

## 2021-03-31 LAB — BASIC METABOLIC PANEL
Anion gap: 7 (ref 5–15)
BUN: 22 mg/dL (ref 8–23)
CO2: 25 mmol/L (ref 22–32)
Calcium: 8.9 mg/dL (ref 8.9–10.3)
Chloride: 105 mmol/L (ref 98–111)
Creatinine, Ser: 0.72 mg/dL (ref 0.44–1.00)
GFR, Estimated: >60 mL/min (ref >60)
Glucose, Bld: 124 mg/dL — ABNORMAL HIGH (ref 70–99)
Potassium: 3 mmol/L — ABNORMAL LOW (ref 3.5–5.1)
Sodium: 137 mmol/L (ref 135–145)

## 2021-03-31 LAB — T4, FREE: Free T4: 0.97 ng/dL (ref 0.61–1.12)

## 2021-03-31 LAB — MAGNESIUM: Magnesium: 1.7 mg/dL (ref 1.7–2.4)

## 2021-03-31 MED ORDER — PANTOPRAZOLE SODIUM 40 MG IV SOLR
40.0000 mg | INTRAVENOUS | Status: DC
  Administered 2021-03-31 – 2021-04-01 (×2): 40 mg via INTRAVENOUS
  Filled 2021-03-31 (×2): qty 10

## 2021-03-31 MED ORDER — DILTIAZEM HCL ER COATED BEADS 180 MG PO CP24
180.0000 mg | ORAL_CAPSULE | Freq: Every day | ORAL | Status: DC
Start: 2021-03-31 — End: 2021-04-01
  Administered 2021-03-31 – 2021-04-01 (×2): 180 mg via ORAL
  Filled 2021-03-31 (×2): qty 1

## 2021-03-31 MED ORDER — APIXABAN 5 MG PO TABS: 5.0000 mg | ORAL_TABLET | Freq: Two times a day (BID) | ORAL | Status: DC

## 2021-03-31 MED ORDER — POTASSIUM CHLORIDE CRYS ER 20 MEQ PO TBCR
40.0000 meq | EXTENDED_RELEASE_TABLET | ORAL | Status: AC
  Administered 2021-03-31 (×2): 40 meq via ORAL
  Filled 2021-03-31 (×2): qty 2

## 2021-03-31 MED ORDER — APIXABAN 5 MG PO TABS
5.0000 mg | ORAL_TABLET | Freq: Two times a day (BID) | ORAL | Status: DC
  Administered 2021-03-31 – 2021-04-01 (×4): 5 mg via ORAL
  Filled 2021-03-31 (×4): qty 1

## 2021-03-31 MED ORDER — MAGNESIUM SULFATE 2 GM/50ML IV SOLN
2.0000 g | Freq: Once | INTRAVENOUS | Status: AC
  Administered 2021-03-31: 2 g via INTRAVENOUS
  Filled 2021-03-31: qty 50

## 2021-03-31 MED ORDER — POTASSIUM CHLORIDE CRYS ER 20 MEQ PO TBCR: 40.0000 meq | EXTENDED_RELEASE_TABLET | Freq: Once | ORAL | Status: DC

## 2021-03-31 NOTE — Assessment & Plan Note (Signed)
Stable

## 2021-03-31 NOTE — Progress Notes (Signed)
Patient converted to sinus rhythm at around 5:40AM, heart rate below 60's' cardizem gtt discontinued. Notified provider, Sharion Settler NP.  ?

## 2021-03-31 NOTE — Assessment & Plan Note (Signed)
Lab Results  ?Component Value Date  ? CREATININE 0.86 03/30/2021  ? CREATININE 0.86 02/04/2021  ? CREATININE 0.81 12/10/2020  ?resolved we will minot.  ? ?

## 2021-03-31 NOTE — Assessment & Plan Note (Signed)
We will type and screen/ I VPPI.  ?Stable h/h otherwise. ?CBC 03/30/2021 02/04/2021 12/10/2020  ?WBC 5.5 5.2 5.3  ?Hemoglobin 11.4(L) 11.2(L) 11.5(L)  ?Hematocrit 35.6(L) 32.9(L) 33.6(L)  ?Platelets 151 149(L) 149(L)  ? ?  ? ?

## 2021-03-31 NOTE — Progress Notes (Addendum)
Progress Note    Emma [name]  WCB:[id number] DOB: 1940/09/10  DOA: 03/30/2021 PCP: [name], MD      Brief Narrative:    Medical records reviewed and are as summarized below:  Emma [name] is a 81 y.o. 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  She was found to have atrial fibrillation with RVR.  He was treated with IV Cardizem drip and she converted to normal sinus rhythm.      Assessment/Plan:   Principal Problem:   Chest pain Active Problems:   Chronic diastolic heart failure (HCC)   Atrial fibrillation with RVR (HCC)   Iron deficiency anemia due to chronic blood loss   GERD (gastroesophageal reflux disease)   Fall   Anxiety   HTN (hypertension)   Hypothyroidism   Metastatic malignant carcinoid tumor to liver [facility])   Chronic kidney disease   Atrial fibrillation with rapid ventricular response (HCC)    Body mass index is 34.28 kg/m.  (Obesity)  Atrial fibrillation with RVR: She has converted to normal sinus rhythm.  IV Cardizem drip has been discontinued.  Start oral Cardizem.  Continue Eliquis.  Risks, benefits and alternatives to long-term anticoagulation were discussed with the patient and her daughter at the bedside.  They understand that patient is at increased risk of bleeding including but not limited to GI bleeding, intracranial bleeding, intraperitoneal bleeding, bleeding into the joint  or skin.  Her daughter says she took blood thinners when she had a blood clot and she did well on blood thinners.  Patient is worried about having a stroke so she prefers to live with the risk of bleeding and take Eliquis.  Patient and daughter said they are not aware of any history of prior stroke.   CHA2DS2-VASc score is 5 if there is no history of stroke and 7 if there is a history of stroke.  MRI brain from October 2020 did not show any evidence of stroke. 2D echo is pending.  Hypokalemia: Replete potassium and monitor levels.  Replete magnesium because of low normal magnesium level.  Chronic diastolic CHF: Compensated.  Continue Lasix  Chest pain, minimally elevated but flat troponin: Chest pain has resolved.  This is likely from rapid A-fib.  Mildly elevated troponins likely from demand ischemia.    Metastatic malignant carcinoid tumor to the liver: Outpatient follow-up with oncologist.  Other comorbidities include history of intermittent GI bleed, hypertension, iron deficiency anemia, anxiety, hypothyroidism  Diet Order             Diet Heart Room service appropriate? Yes; Fluid consistency: Thin  Diet effective now                          Consultants: None  Procedures: None    Medications:    apixaban  5 mg Oral BID   diltiazem  180 mg Oral Daily   levothyroxine  88 mcg Oral Q0600   nicotine  14 mg Transdermal Daily   pantoprazole (PROTONIX) IV  40 mg Intravenous Q24H   potassium chloride  40 mEq Oral Q4H   sodium chloride flush  3 mL Intravenous Q12H   Continuous Infusions:  sodium chloride     diltiazem (CARDIZEM) infusion Stopped (03/31/21 0540)  magnesium sulfate bolus IVPB       Anti-infectives (From admission, onward)    None              Family Communication/Anticipated D/C date and plan/Code Status   DVT prophylaxis:  apixaban (ELIQUIS) tablet 5 mg     Code Status: Full Code  Family Communication: Plan discussed with [name], her daughter, and granddaughter at the bedside Disposition Plan: Plan to discharge home tomorrow   Status is: Inpatient Remains inpatient appropriate because: Rapid A-fib.  Monitor heart rate               Subjective:   Interval events noted.  No chest pain, shortness of breath,  palpitations or dizziness.  No rectal bleeding.  Objective:    Vitals:   03/31/21 0300 03/31/21 0447 03/31/21 0819 03/31/21 1128  BP:  133/73 (!) 131/52 (!) 111/48  Pulse:  80 62 65  Resp:   17 18  Temp:  97.8 F (36.6 C) (!) 97.5 F (36.4 C) 97.8 F (36.6 C)  TempSrc:      SpO2:  98% 97% 97%  Weight: 93.4 kg     Height: '5\' 5"'$  (1.651 m)      No data found.   Intake/Output Summary (Last 24 hours) at 03/31/2021 1325 Last data filed at 03/31/2021 1128 Gross per 24 hour  Intake 290.94 ml  Output 0 ml  Net 290.94 ml   Filed Weights   03/30/21 2306 03/31/21 0300  Weight: 95.3 kg 93.4 kg    Exam:  GEN: NAD SKIN: No rash EYES: EOMI ENT: MMM, hearing impairment CV: RRR PULM: CTA B ABD: soft, obese, NT, +BS CNS: AAO x 3, non focal EXT: No edema or tenderness        Data Reviewed:   I have personally reviewed following labs and imaging studies:  Labs: Labs show the following:   Basic Metabolic Panel: Recent Labs  Lab 03/30/21 1914 03/31/21 0500  NA 138 137  K 3.7 3.0*  CL 108 105  CO2 23 25  GLUCOSE 110* 124*  BUN 27* 22  CREATININE 0.86 0.72  CALCIUM 8.9 8.9  MG  --  1.7   GFR Estimated Creatinine Clearance: 63.4 mL/min (by C-G formula based on SCr of 0.72 mg/dL). Liver Function Tests: Recent Labs  Lab 03/30/21 1914  AST 23  ALT 14  ALKPHOS 76  BILITOT 0.7  PROT 6.2*  ALBUMIN 3.1*   No results for input(s): LIPASE, AMYLASE in the last 168 hours. No results for input(s): AMMONIA in the last 168 hours. Coagulation profile No results for input(s): INR, PROTIME in the last 168 hours.  CBC: Recent Labs  Lab 03/30/21 1914 03/31/21 0500  WBC 5.5 5.4  HGB 11.4* 11.2*  HCT 35.6* 33.4*  MCV 98.1 95.2  PLT 151 160   Cardiac Enzymes: No results for input(s): CKTOTAL, CKMB, CKMBINDEX, TROPONINI in the last 168 hours. BNP (last 3 results) No results for input(s): PROBNP in the last 8760 hours. CBG: No results for input(s): GLUCAP in the  last 168 hours. D-Dimer: No results for input(s): DDIMER in the last 72 hours. Hgb A1c: No results for input(s): HGBA1C in the last 72 hours. Lipid Profile: No results for input(s): CHOL, HDL, LDLCALC, TRIG, CHOLHDL, LDLDIRECT in the last 72 hours. Thyroid function studies: Recent Labs    03/31/21 0500  TSH 0.998   Anemia work up: No results for input(s): VITAMINB12, FOLATE, FERRITIN, TIBC, IRON, RETICCTPCT in the last 72 hours.  Sepsis Labs: Recent Labs  Lab 03/30/21 1914 03/31/21 0500  WBC 5.5 5.4    Microbiology Recent Results (from the past 240 hour(s))  Resp Panel by RT-PCR (Flu A&B, Covid) Nasopharyngeal Swab     Status: None   Collection Time: 03/30/21 10:40 PM   Specimen: Nasopharyngeal Swab; Nasopharyngeal(NP) swabs in vial transport medium  Result Value Ref Range Status   SARS Coronavirus 2 by RT PCR NEGATIVE NEGATIVE Final    Comment: (NOTE) SARS-CoV-2 target nucleic acids are NOT DETECTED.  The SARS-CoV-2 RNA is generally detectable in upper respiratory specimens during the acute phase of infection. The lowest concentration of SARS-CoV-2 viral copies this assay can detect is 138 copies/mL. A negative result does not preclude SARS-Cov-2 infection and should not be used as the sole basis for treatment or other patient management decisions. A negative result may occur with  improper specimen collection/handling, submission of specimen other than nasopharyngeal swab, presence of viral mutation(s) within the areas targeted by this assay, and inadequate number of viral copies(<138 copies/mL). A negative result must be combined with clinical observations, patient history, and epidemiological information. The expected result is Negative.  Fact Sheet for Patients:  EntrepreneurPulse.com.au  Fact Sheet for Healthcare Providers:  IncredibleEmployment.be  This test is no t yet approved or cleared by the [place] FDA and  has  been authorized for detection and/or diagnosis of SARS-CoV-2 by FDA under an Emergency Use Authorization (EUA). This EUA will remain  in effect (meaning this test can be used) for the duration of the COVID-19 declaration under Section 564(b)(1) of the Act, 21 U.S.C.section 360bbb-3(b)(1), unless the authorization is terminated  or revoked sooner.       Influenza A by PCR NEGATIVE NEGATIVE Final   Influenza B by PCR NEGATIVE NEGATIVE Final    Comment: (NOTE) The Xpert Xpress SARS-CoV-2/FLU/RSV plus assay is intended as an aid in the diagnosis of influenza from Nasopharyngeal swab specimens and should not be used as a sole basis for treatment. Nasal washings and aspirates are unacceptable for Xpert Xpress SARS-CoV-2/FLU/RSV testing.  Fact Sheet for Patients: EntrepreneurPulse.com.au  Fact Sheet for Healthcare Providers: IncredibleEmployment.be  This test is not yet approved or cleared by the [place] FDA and has been authorized for detection and/or diagnosis of SARS-CoV-2 by FDA under an Emergency Use Authorization (EUA). This EUA will remain in effect (meaning this test can be used) for the duration of the COVID-19 declaration under Section 564(b)(1) of the Act, 21 U.S.C. section 360bbb-3(b)(1), unless the authorization is terminated or revoked.  Performed at [facility], [address] [place], [place] [address]     Procedures and diagnostic studies:  DG Chest 2 View  Result Date: 03/30/2021 CLINICAL DATA:  CP EXAM: CHEST - 2 VIEW COMPARISON:  November 14, 2020, January 27, 2016 FINDINGS: The cardiomediastinal silhouette is unchanged in contour.Atherosclerotic calcifications of the tortuous thoracic aorta. No pleural effusion. No pneumothorax. No acute pleuroparenchymal abnormality. Visualized abdomen is unremarkable. IMPRESSION: No acute cardiopulmonary abnormality. Electronically Signed   By: Valentino Saxon M.D.   On:  03/30/2021 19:36   ECHOCARDIOGRAM COMPLETE  Result Date: 03/31/2021    ECHOCARDIOGRAM REPORT   Patient Name:   Emma [name] Date of Exam: 03/31/2021 Medical Rec #:  [id number]       Height:       65.0 in Accession #:    [id number]      Weight:       206.0 lb Date of Birth:  [birth year]  BSA:          2.003 m Patient Age:    73 years        BP:           132/83 mmHg Patient Gender: F               HR:           69 bpm. Exam Location:  [facility] Procedure: 2D Echo, Cardiac Doppler and Color Doppler Indications:     Atrial Fibrillation I48.91  History:         Patient has prior history of Echocardiogram examinations. CHF;                  Signs/Symptoms:Dyspnea.  Sonographer:     Alyse Low Roar Referring Phys:  Purdy Diagnosing Phys: [name] MD IMPRESSIONS  1. Left ventricular ejection fraction, by estimation, is 60 to 65%. The left ventricle has normal function. The left ventricle has no regional wall motion abnormalities. Left ventricular diastolic parameters were normal.  2. Right ventricular systolic function is normal. The right ventricular size is normal.  3. The mitral valve is normal in structure. Mild mitral valve regurgitation. No evidence of mitral stenosis.  4. The aortic valve is normal in structure. Aortic valve regurgitation is not visualized. No aortic stenosis is present.  5. The inferior vena cava is normal in size with greater than 50% respiratory variability, suggesting right atrial pressure of 3 mmHg. FINDINGS  Left Ventricle: Left ventricular ejection fraction, by estimation, is 60 to 65%. The left ventricle has normal function. The left ventricle has no regional wall motion abnormalities. The left ventricular internal cavity size was normal in size. There is  no left ventricular hypertrophy. Left ventricular diastolic parameters were normal. Right Ventricle: The right ventricular size is normal. No increase in right ventricular wall thickness. Right ventricular systolic  function is normal. Left Atrium: Left atrial size was normal in size. Right Atrium: Right atrial size was normal in size. Pericardium: There is no evidence of pericardial effusion. Mitral Valve: The mitral valve is normal in structure. Mild to moderate mitral annular calcification. Mild mitral valve regurgitation. No evidence of mitral valve stenosis. Tricuspid Valve: The tricuspid valve is normal in structure. Tricuspid valve regurgitation is mild . No evidence of tricuspid stenosis. Aortic Valve: The aortic valve is normal in structure. Aortic valve regurgitation is not visualized. No aortic stenosis is present. Aortic valve peak gradient measures 8.3 mmHg. Pulmonic Valve: The pulmonic valve was normal in structure. Pulmonic valve regurgitation is not visualized. No evidence of pulmonic stenosis. Aorta: The aortic root is normal in size and structure. Venous: The inferior vena cava is normal in size with greater than 50% respiratory variability, suggesting right atrial pressure of 3 mmHg. IAS/Shunts: No atrial level shunt detected by color flow Doppler.  LEFT VENTRICLE PLAX 2D LVIDd:         5.36 cm   Diastology LVIDs:         3.88 cm   LV e' medial:    5.87 cm/s LV PW:         1.26 cm   LV E/e' medial:  14.6 LV IVS:        1.40 cm   LV e' lateral:   8.05 cm/s LVOT diam:     1.70 cm   LV E/e' lateral: 10.6 LVOT Area:     2.27 cm  RIGHT VENTRICLE RV Mid diam:    2.50 cm  RV S prime:     12.60 cm/s TAPSE (M-mode): 2.7 cm LEFT ATRIUM             Index        RIGHT ATRIUM          Index LA diam:        4.90 cm 2.45 cm/m   RA Area:     9.82 cm LA Vol (A2C):   69.0 ml 34.45 ml/m  RA Volume:   17.30 ml 8.64 ml/m LA Vol (A4C):   72.1 ml 35.99 ml/m LA Biplane Vol: 71.0 ml 35.44 ml/m  AORTIC VALVE                 PULMONIC VALVE AV Area (Vmax): 1.80 cm     PV Vmax:          1.05 m/s AV Vmax:        144.00 cm/s  PV Peak grad:     4.4 mmHg AV Peak Grad:   8.3 mmHg     PR End Diast Vel: 5.48 msec LVOT Vmax:      114.00  cm/s  RVOT Peak grad:   1 mmHg  AORTA Ao Root diam: 3.10 cm Ao Asc diam:  3.20 cm MITRAL VALVE               TRICUSPID VALVE MV Area (PHT): 3.93 cm    TR Peak grad:   29.8 mmHg MV Decel Time: 193 msec    TR Vmax:        273.00 cm/s MV E velocity: 85.70 cm/s MV A velocity: 42.80 cm/s  SHUNTS MV E/A ratio:  2.00        Systemic Diam: 1.70 cm MV A Prime:    5.3 cm/s [name] MD Electronically signed by [name] MD Signature Date/Time: 03/31/2021/1:02:49 PM    Final                LOS: 0 days   Mariah Gerstenberger  [facility]   Pager on [email or web address]. If 7PM-7AM, please contact night-coverage at [email or web address]     03/31/2021, 1:25 PM

## 2021-03-31 NOTE — Assessment & Plan Note (Addendum)
We will cont with diltiazem and monitor daily intake and output and weights.  ?We will hold lasix after single dose tonight of 20 mg x 1. ?We will resume losartan once BP allows and pt is off drip.  ? ?

## 2021-03-31 NOTE — Assessment & Plan Note (Signed)
Continue levothyroxine at 88 mcg.  ? ?

## 2021-03-31 NOTE — ED Notes (Signed)
Denies CP/Pressure at this time. Continues to be in Afib. Rates Variable 70-90 for a short while after dose of Cardizem given. HR noted to increase back in to the 110's.  ?

## 2021-03-31 NOTE — ED Notes (Signed)
Cardizem infusion started at 5 mg/hr. Continues to be in Afib. HR 110's. ?

## 2021-03-31 NOTE — Assessment & Plan Note (Signed)
Pt has agreed to plan with anticoagulation with eliquis.  ?Pt has chads score of 7 putting her at moderate to high risk for VTE. ?We will obtain echo and resume other home meds. ? ?

## 2021-03-31 NOTE — Assessment & Plan Note (Addendum)
Pt is being followed by oncologist.  ?Oncology consult per am team as deemed appropriate.  ? ?

## 2021-03-31 NOTE — Assessment & Plan Note (Signed)
Cont pantoprazole.  ? ?

## 2021-03-31 NOTE — Assessment & Plan Note (Addendum)
-  attribute pt's chest pain to increase demand from chf and a.fib rvr.  ?-troponin is same and ekg is negative for any acute finding.  ?-we will obtain and echo and no chest pian currently.  ?PRN nitroglycerine.  ?

## 2021-03-31 NOTE — Progress Notes (Signed)
*  PRELIMINARY RESULTS* ?Echocardiogram ?2D Echocardiogram has been performed. ? ?Emma [name] ?03/31/2021, 12:42 PM ?

## 2021-03-31 NOTE — Assessment & Plan Note (Addendum)
Pt  Has h/o falls we obtain pwill monitor with fall precaution.  ?

## 2021-03-31 NOTE — ED Notes (Addendum)
A&Ox4. Skin p/w/d. RR even and nonlabored. No concerns of chest pain/pressure. Afib per monitor. HR 80-90. Pt was assisted to the bathroom with one assist. Per family, pt is unable to walk and is in wheelchair most of the time. Patient has good mobility with A1. She was able to move herself to the edge of the bed, stand with minimal assist, took a few steps to the toilet.  ?

## 2021-03-31 NOTE — Assessment & Plan Note (Signed)
Blood pressure 128/84, pulse 94, temperature 98 ?F (36.7 ?C), temperature source Oral, resp. rate 15, height '5\' 5"'$  (1.651 m), weight 95.3 kg, SpO2 100 %. ?we will hold home regimen of  Hyzaar labetalol and lasix.  ? ? ?

## 2021-04-01 ENCOUNTER — Inpatient Hospital Stay: Payer: Medicare Other | Admitting: Hospice and Palliative Medicine

## 2021-04-01 ENCOUNTER — Inpatient Hospital Stay: Payer: Medicare Other

## 2021-04-01 ENCOUNTER — Inpatient Hospital Stay: Payer: Medicare Other | Admitting: Internal Medicine

## 2021-04-01 DIAGNOSIS — I4891 Unspecified atrial fibrillation: Secondary | ICD-10-CM | POA: Diagnosis not present

## 2021-04-01 LAB — BASIC METABOLIC PANEL
Anion gap: 7 (ref 5–15)
BUN: 24 mg/dL — ABNORMAL HIGH (ref 8–23)
CO2: 25 mmol/L (ref 22–32)
Calcium: 8.5 mg/dL — ABNORMAL LOW (ref 8.9–10.3)
Chloride: 109 mmol/L (ref 98–111)
Creatinine, Ser: 0.81 mg/dL (ref 0.44–1.00)
GFR, Estimated: >60 mL/min (ref >60)
Glucose, Bld: 130 mg/dL — ABNORMAL HIGH (ref 70–99)
Potassium: 3.7 mmol/L (ref 3.5–5.1)
Sodium: 141 mmol/L (ref 135–145)

## 2021-04-01 LAB — MAGNESIUM: Magnesium: 2 mg/dL (ref 1.7–2.4)

## 2021-04-01 MED ORDER — DILTIAZEM HCL ER COATED BEADS 180 MG PO CP24: 180.0000 mg | ORAL_CAPSULE | Freq: Every day | ORAL | 0 refills | Status: DC

## 2021-04-01 MED ORDER — APIXABAN 5 MG PO TABS: 5.0000 mg | ORAL_TABLET | Freq: Two times a day (BID) | ORAL | 0 refills | Status: AC

## 2021-04-01 NOTE — Progress Notes (Signed)
?  Transition of Care (TOC) Screening Note ? ? ?Patient Details  ?Name: TIWANA CHAVIS ?Date of Birth: Oct 27, 1940 ? ? ?Transition of Care (TOC) CM/SW Contact:    ?Alberteen Sam, LCSW ?Phone Number: ?04/01/2021, 8:35 AM ? ? ? ?Transition of Care Department [facility]) has reviewed patient and no TOC needs have been identified at this time. We will continue to monitor patient advancement through interdisciplinary progression rounds. If new patient transition needs arise, please place a TOC consult. ? Pricilla Riffle, [place] ?[phone] ? ?

## 2021-04-01 NOTE — Discharge Summary (Signed)
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)   Chronic kidney disease   Atrial fibrillation with rapid ventricular response ([place])    Discharge Condition: Stable.  Diet recommendation:  Diet Order             Diet - low sodium heart healthy           Diet Heart Room service appropriate? Yes; Fluid consistency: Thin  Diet effective now                     Code Status: Full Code     Hospital Course:   Ms. Emma [name] is a 81 y.o. 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   She was found to have atrial fibrillation with RVR.  She was treated with IV Cardizem drip and she converted to normal sinus rhythm.  She opted for long-term anticoagulation with Eliquis.  Of note, she has a history of recent intermittent rectal bleeding and she is at increased risk of bleeding.  However, she was worried about having a stroke which could be debilitating or potentially fatal.  She said she will discontinue Eliquis if she has any bleeding complications from it.  She had hypokalemia that was repleted.  Her condition has improved and she is deemed stable for  discharge to home today.     Discharge Exam:    Vitals:   04/01/21 0341 04/01/21 0512 04/01/21 0722 04/01/21 1139  BP: (!) 122/53  (!) 120/53 (!) 121/55  Pulse: 74  70 67  Resp: '18  18 18  '$ Temp: 97.9 F (36.6 C)  98.2 F (36.8 C) 98 F (36.7 C)  TempSrc:    Oral  SpO2: 94%  93% 94%  Weight:  95 kg    Height:         GEN: NAD SKIN: No rash EYES: EOMI ENT: MMM CV: RRR PULM: CTA B ABD: soft, ND, NT, +BS CNS: AAO x 3, non focal EXT: No edema or tenderness   The results of significant diagnostics from this hospitalization (including imaging, microbiology, ancillary and laboratory) are listed below for reference.     Procedures and Diagnostic Studies:   ECHOCARDIOGRAM COMPLETE  Result Date: 03/31/2021    ECHOCARDIOGRAM REPORT   Patient Name:   Emma [name] Date of Exam: 03/31/2021 Medical Rec #:  [id number]       Height:       65.0 in Accession #:    [id number]      Weight:       206.0 lb Date of Birth:  Apr 08, 1940       BSA:          2.003 m Patient Age:  81 years        BP:           132/83 mmHg Patient Gender: F               HR:           69 bpm. Exam Location:  [facility] Procedure: 2D Echo, Cardiac Doppler and Color Doppler Indications:     Atrial Fibrillation I48.91  History:         Patient has prior history of Echocardiogram examinations. CHF;                  Signs/Symptoms:Dyspnea.  Sonographer:     Alyse Low Roar Referring Phys:  [place] Diagnosing Phys: [name] MD IMPRESSIONS  1. Left ventricular ejection fraction, by estimation, is 60 to 65%. The left ventricle has normal function. The left ventricle has no regional wall motion abnormalities. Left ventricular diastolic parameters were normal.  2. Right ventricular systolic function is normal. The right ventricular size is normal.  3. The mitral valve is normal in structure. Mild mitral valve regurgitation. No evidence of mitral stenosis.  4. The aortic valve is normal in structure. Aortic valve  regurgitation is not visualized. No aortic stenosis is present.  5. The inferior vena cava is normal in size with greater than 50% respiratory variability, suggesting right atrial pressure of 3 mmHg. FINDINGS  Left Ventricle: Left ventricular ejection fraction, by estimation, is 60 to 65%. The left ventricle has normal function. The left ventricle has no regional wall motion abnormalities. The left ventricular internal cavity size was normal in size. There is  no left ventricular hypertrophy. Left ventricular diastolic parameters were normal. Right Ventricle: The right ventricular size is normal. No increase in right ventricular wall thickness. Right ventricular systolic function is normal. Left Atrium: Left atrial size was normal in size. Right Atrium: Right atrial size was normal in size. Pericardium: There is no evidence of pericardial effusion. Mitral Valve: The mitral valve is normal in structure. Mild to moderate mitral annular calcification. Mild mitral valve regurgitation. No evidence of mitral valve stenosis. Tricuspid Valve: The tricuspid valve is normal in structure. Tricuspid valve regurgitation is mild . No evidence of tricuspid stenosis. Aortic Valve: The aortic valve is normal in structure. Aortic valve regurgitation is not visualized. No aortic stenosis is present. Aortic valve peak gradient measures 8.3 mmHg. Pulmonic Valve: The pulmonic valve was normal in structure. Pulmonic valve regurgitation is not visualized. No evidence of pulmonic stenosis. Aorta: The aortic root is normal in size and structure. Venous: The inferior vena cava is normal in size with greater than 50% respiratory variability, suggesting right atrial pressure of 3 mmHg. IAS/Shunts: No atrial level shunt detected by color flow Doppler.  LEFT VENTRICLE PLAX 2D LVIDd:         5.36 cm   Diastology LVIDs:         3.88 cm   LV e' medial:    5.87 cm/s LV PW:         1.26 cm   LV E/e' medial:  14.6 LV IVS:        1.40 cm   LV e'  lateral:   8.05 cm/s LVOT diam:     1.70 cm   LV E/e' lateral: 10.6 LVOT Area:     2.27 cm  RIGHT VENTRICLE RV Mid diam:    2.50 cm RV S prime:     12.60 cm/s TAPSE (M-mode): 2.7 cm LEFT ATRIUM  Index        RIGHT ATRIUM          Index LA diam:        4.90 cm 2.45 cm/m   RA Area:     9.82 cm LA Vol (A2C):   69.0 ml 34.45 ml/m  RA Volume:   17.30 ml 8.64 ml/m LA Vol (A4C):   72.1 ml 35.99 ml/m LA Biplane Vol: 71.0 ml 35.44 ml/m  AORTIC VALVE                 PULMONIC VALVE AV Area (Vmax): 1.80 cm     PV Vmax:          1.05 m/s AV Vmax:        144.00 cm/s  PV Peak grad:     4.4 mmHg AV Peak Grad:   8.3 mmHg     PR End Diast Vel: 5.48 msec LVOT Vmax:      114.00 cm/s  RVOT Peak grad:   1 mmHg  AORTA Ao Root diam: 3.10 cm Ao Asc diam:  3.20 cm MITRAL VALVE               TRICUSPID VALVE MV Area (PHT): 3.93 cm    TR Peak grad:   29.8 mmHg MV Decel Time: 193 msec    TR Vmax:        273.00 cm/s MV E velocity: 85.70 cm/s MV A velocity: 42.80 cm/s  SHUNTS MV E/A ratio:  2.00        Systemic Diam: 1.70 cm MV A Prime:    5.3 cm/s [name] MD Electronically signed by [name] MD Signature Date/Time: 03/31/2021/1:02:49 PM    Final      Labs:   Basic Metabolic Panel: Recent Labs  Lab 03/30/21 1914 03/31/21 0500 04/01/21 0455  NA 138 137 141  K 3.7 3.0* 3.7  CL 108 105 109  CO2 '23 25 25  '$ GLUCOSE 110* 124* 130*  BUN 27* 22 24*  CREATININE 0.86 0.72 0.81  CALCIUM 8.9 8.9 8.5*  MG  --  1.7 2.0   GFR Estimated Creatinine Clearance: 63.1 mL/min (by C-G formula based on SCr of 0.81 mg/dL). Liver Function Tests: Recent Labs  Lab 03/30/21 1914  AST 23  ALT 14  ALKPHOS 76  BILITOT 0.7  PROT 6.2*  ALBUMIN 3.1*   No results for input(s): LIPASE, AMYLASE in the last 168 hours. No results for input(s): AMMONIA in the last 168 hours. Coagulation profile No results for input(s): INR, PROTIME in the last 168 hours.  CBC: Recent Labs  Lab 03/30/21 1914 03/31/21 0500   WBC 5.5 5.4  HGB 11.4* 11.2*  HCT 35.6* 33.4*  MCV 98.1 95.2  PLT 151 160   Cardiac Enzymes: No results for input(s): CKTOTAL, CKMB, CKMBINDEX, TROPONINI in the last 168 hours. BNP: Invalid input(s): POCBNP CBG: No results for input(s): GLUCAP in the last 168 hours. D-Dimer No results for input(s): DDIMER in the last 72 hours. Hgb A1c No results for input(s): HGBA1C in the last 72 hours. Lipid Profile No results for input(s): CHOL, HDL, LDLCALC, TRIG, CHOLHDL, LDLDIRECT in the last 72 hours. Thyroid function studies Recent Labs    03/31/21 0500  TSH 0.998   Anemia work up No results for input(s): VITAMINB12, FOLATE, FERRITIN, TIBC, IRON, RETICCTPCT in the last 72 hours. Microbiology Recent Results (from the past 240 hour(s))  Resp Panel by RT-PCR (Flu A&B, Covid) Nasopharyngeal Swab     Status:  None   Collection Time: 03/30/21 10:40 PM   Specimen: Nasopharyngeal Swab; Nasopharyngeal(NP) swabs in vial transport medium  Result Value Ref Range Status   SARS Coronavirus 2 by RT PCR NEGATIVE NEGATIVE Final    Comment: (NOTE) SARS-CoV-2 target nucleic acids are NOT DETECTED.  The SARS-CoV-2 RNA is generally detectable in upper respiratory specimens during the acute phase of infection. The lowest concentration of SARS-CoV-2 viral copies this assay can detect is 138 copies/mL. A negative result does not preclude SARS-Cov-2 infection and should not be used as the sole basis for treatment or other patient management decisions. A negative result may occur with  improper specimen collection/handling, submission of specimen other than nasopharyngeal swab, presence of viral mutation(s) within the areas targeted by this assay, and inadequate number of viral copies(<138 copies/mL). A negative result must be combined with clinical observations, patient history, and epidemiological information. The expected result is Negative.  Fact Sheet for Patients:   EntrepreneurPulse.com.au  Fact Sheet for Healthcare Providers:  IncredibleEmployment.be  This test is no t yet approved or cleared by the [place] FDA and  has been authorized for detection and/or diagnosis of SARS-CoV-2 by FDA under an Emergency Use Authorization (EUA). This EUA will remain  in effect (meaning this test can be used) for the duration of the COVID-19 declaration under Section 564(b)(1) of the Act, 21 U.S.C.section 360bbb-3(b)(1), unless the authorization is terminated  or revoked sooner.       Influenza A by PCR NEGATIVE NEGATIVE Final   Influenza B by PCR NEGATIVE NEGATIVE Final    Comment: (NOTE) The Xpert Xpress SARS-CoV-2/FLU/RSV plus assay is intended as an aid in the diagnosis of influenza from Nasopharyngeal swab specimens and should not be used as a sole basis for treatment. Nasal washings and aspirates are unacceptable for Xpert Xpress SARS-CoV-2/FLU/RSV testing.  Fact Sheet for Patients: EntrepreneurPulse.com.au  Fact Sheet for Healthcare Providers: IncredibleEmployment.be  This test is not yet approved or cleared by the [place] FDA and has been authorized for detection and/or diagnosis of SARS-CoV-2 by FDA under an Emergency Use Authorization (EUA). This EUA will remain in effect (meaning this test can be used) for the duration of the COVID-19 declaration under Section 564(b)(1) of the Act, 21 U.S.C. section 360bbb-3(b)(1), unless the authorization is terminated or revoked.  Performed at [facility], [address] [place],  [address]      Discharge Instructions:   Discharge Instructions     Amb referral to AFIB Clinic   Complete by: As directed    Diet - low sodium heart healthy   Complete by: As directed    Increase activity slowly   Complete by: As directed       Allergies as of 04/01/2021       Reactions   Sulfa Antibiotics  Other (See Comments)   Other reaction(s): Kidney Disorder        Medication List     STOP taking these medications    CALCIUM 600 + D PO   hyoscyamine 0.125 MG Tbdp disintergrating tablet Commonly known as: ANASPAZ   octreotide 30 MG injection Commonly known as: SANDOSTATIN LAR   ondansetron 8 MG tablet Commonly known as: ZOFRAN       TAKE these medications    apixaban 5 MG Tabs tablet Commonly known as: ELIQUIS Take 1 tablet (5 mg total) by mouth 2 (two) times daily.   diltiazem 180 MG 24 hr capsule Commonly known as: CARDIZEM CD Take 1 capsule (180  mg total) by mouth daily. Start taking on: April 02, 2021   furosemide 40 MG tablet Commonly known as: LASIX Take 40 mg by mouth daily.   labetalol 200 MG tablet Commonly known as: NORMODYNE Take 200 mg by mouth 2 (two) times daily.   levothyroxine 88 MCG tablet Commonly known as: SYNTHROID Take 88 mcg by mouth daily before breakfast.   losartan-hydrochlorothiazide 100-25 MG tablet Commonly known as: HYZAAR Take 1 tablet by mouth daily.   nystatin powder Commonly known as: MYCOSTATIN/NYSTOP APPLY TO AFFECTED AREA TWICE A DAY   pantoprazole 40 MG tablet Commonly known as: PROTONIX TAKE 1 TABLET BY MOUTH EVERY DAY   potassium chloride SA 20 MEQ tablet Commonly known as: [place]-Con M20 TAKE 1 TABLET BY MOUTH EVERY DAY   traMADol 50 MG tablet Commonly known as: ULTRAM 0.5 tablet to 1 tablet every 8 hours as needed for pain   Vitamin D (Ergocalciferol) 1.25 MG (50000 UNIT) Caps capsule Commonly known as: DRISDOL TAKE 1 CAPSULE BY MOUTH ONE TIME PER WEEK           If you experience worsening of your admission symptoms, develop shortness of breath, life threatening emergency, suicidal or homicidal thoughts you must seek medical attention immediately by calling 911 or calling your MD immediately  if symptoms less severe.   You must read complete instructions/literature along with all the possible  adverse reactions/side effects for all the medicines you take and that have been prescribed to you. Take any new medicines after you have completely understood and accept all the possible adverse reactions/side effects.    Please note   You were cared for by a hospitalist during your hospital stay. If you have any questions about your discharge medications or the care you received while you were in the hospital after you are discharged, you can call the unit and asked to speak with the hospitalist on call if the hospitalist that took care of you is not available. Once you are discharged, your primary care physician will handle any further medical issues. Please note that NO REFILLS for any discharge medications will be authorized once you are discharged, as it is imperative that you return to your primary care physician (or establish a relationship with a primary care physician if you do not have one) for your aftercare needs so that they can reassess your need for medications and monitor your lab values.       Time coordinating discharge: greater than 30 minutes  Signed:  Laqueta Bonaventura  [facility] 04/01/2021, 11:54 AM   Pager on [email or web address]. If 7PM-7AM, please contact night-coverage at [email or web address]

## 2021-04-11 ENCOUNTER — Other Ambulatory Visit: Payer: Self-pay

## 2021-04-11 ENCOUNTER — Encounter: Payer: Self-pay | Admitting: Internal Medicine

## 2021-04-11 ENCOUNTER — Inpatient Hospital Stay: Payer: Medicare Other | Attending: Internal Medicine

## 2021-04-11 ENCOUNTER — Inpatient Hospital Stay: Payer: Medicare Other

## 2021-04-11 ENCOUNTER — Inpatient Hospital Stay (HOSPITAL_BASED_OUTPATIENT_CLINIC_OR_DEPARTMENT_OTHER): Payer: Medicare Other | Admitting: Internal Medicine

## 2021-04-11 VITALS — BP 117/52 | HR 53 | Temp 97.6°F | Ht 65.0 in | Wt 216.8 lb

## 2021-04-11 DIAGNOSIS — I509 Heart failure, unspecified: Secondary | ICD-10-CM | POA: Insufficient documentation

## 2021-04-11 DIAGNOSIS — C7A092 Malignant carcinoid tumor of the stomach: Secondary | ICD-10-CM | POA: Insufficient documentation

## 2021-04-11 DIAGNOSIS — N189 Chronic kidney disease, unspecified: Secondary | ICD-10-CM | POA: Insufficient documentation

## 2021-04-11 DIAGNOSIS — I4891 Unspecified atrial fibrillation: Secondary | ICD-10-CM | POA: Diagnosis not present

## 2021-04-11 DIAGNOSIS — C7B02 Secondary carcinoid tumors of liver: Secondary | ICD-10-CM | POA: Insufficient documentation

## 2021-04-11 DIAGNOSIS — E340 Carcinoid syndrome: Secondary | ICD-10-CM | POA: Diagnosis not present

## 2021-04-11 DIAGNOSIS — D509 Iron deficiency anemia, unspecified: Secondary | ICD-10-CM | POA: Diagnosis not present

## 2021-04-11 DIAGNOSIS — I130 Hypertensive heart and chronic kidney disease with heart failure and stage 1 through stage 4 chronic kidney disease, or unspecified chronic kidney disease: Secondary | ICD-10-CM | POA: Diagnosis not present

## 2021-04-11 DIAGNOSIS — Z7901 Long term (current) use of anticoagulants: Secondary | ICD-10-CM | POA: Insufficient documentation

## 2021-04-11 LAB — CBC WITH DIFFERENTIAL/PLATELET
Abs Immature Granulocytes: 0.02 10*3/uL (ref 0.00–0.07)
Basophils Absolute: 0 10*3/uL (ref 0.0–0.1)
Basophils Relative: 1 %
Eosinophils Absolute: 0.3 10*3/uL (ref 0.0–0.5)
Eosinophils Relative: 6 %
HCT: 34.3 % — ABNORMAL LOW (ref 36.0–46.0)
Hemoglobin: 11.2 g/dL — ABNORMAL LOW (ref 12.0–15.0)
Immature Granulocytes: 0 %
Lymphocytes Relative: 9 %
Lymphs Abs: 0.5 10*3/uL — ABNORMAL LOW (ref 0.7–4.0)
MCH: 31.6 pg (ref 26.0–34.0)
MCHC: 32.7 g/dL (ref 30.0–36.0)
MCV: 96.9 fL (ref 80.0–100.0)
Monocytes Absolute: 0.8 10*3/uL (ref 0.1–1.0)
Monocytes Relative: 16 %
Neutro Abs: 3.4 10*3/uL (ref 1.7–7.7)
Neutrophils Relative %: 68 %
Platelets: 164 10*3/uL (ref 150–400)
RBC: 3.54 MIL/uL — ABNORMAL LOW (ref 3.87–5.11)
RDW: 13.7 % (ref 11.5–15.5)
WBC: 5 10*3/uL (ref 4.0–10.5)
nRBC: 0 % (ref 0.0–0.2)

## 2021-04-11 LAB — COMPREHENSIVE METABOLIC PANEL
ALT: 16 U/L (ref 0–44)
AST: 25 U/L (ref 15–41)
Albumin: 3.4 g/dL — ABNORMAL LOW (ref 3.5–5.0)
Alkaline Phosphatase: 71 U/L (ref 38–126)
Anion gap: 10 (ref 5–15)
BUN: 17 mg/dL (ref 8–23)
CO2: 25 mmol/L (ref 22–32)
Calcium: 9.3 mg/dL (ref 8.9–10.3)
Chloride: 103 mmol/L (ref 98–111)
Creatinine, Ser: 0.84 mg/dL (ref 0.44–1.00)
GFR, Estimated: >60 mL/min (ref >60)
Glucose, Bld: 141 mg/dL — ABNORMAL HIGH (ref 70–99)
Potassium: 3.6 mmol/L (ref 3.5–5.1)
Sodium: 138 mmol/L (ref 135–145)
Total Bilirubin: 0.2 mg/dL — ABNORMAL LOW (ref 0.3–1.2)
Total Protein: 6.6 g/dL (ref 6.5–8.1)

## 2021-04-11 LAB — IRON AND TIBC
Iron: 68 ug/dL (ref 28–170)
Saturation Ratios: 19 % (ref 10.4–31.8)
TIBC: 354 ug/dL (ref 250–450)
UIBC: 286 ug/dL

## 2021-04-11 LAB — FERRITIN: Ferritin: 40 ng/mL (ref 11–307)

## 2021-04-11 MED ORDER — OCTREOTIDE ACETATE 30 MG IM KIT
30.0000 mg | PACK | Freq: Once | INTRAMUSCULAR | Status: AC
  Administered 2021-04-11: 30 mg via INTRAMUSCULAR
  Filled 2021-04-11: qty 1

## 2021-04-11 NOTE — Assessment & Plan Note (Addendum)
#   Non- functional Small bowel/mesenteric carcinoid; s/p Lutathera + sandostatin x4 infusions.  October 2022-PET scan shows no evidence of progression of disease or any evidence of new disease; liver metastases-slight decrease in size. ?#Continue Sandostatin q M. Labs today reviewed;  acceptable for treatment today.  ? ?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  Hemoglobin 11.2- ?  Secondary Lutathera- STABLE; Hold off venofer for now.  ? ?# chronic mild diarrhea- ? 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  ? ?# A.fib 2023; March- on eliquis [? Cardiologist]; awaiting to drop off heart monitor at A.fib clinic.  Monitor closely given history of rectal bleeding/?  Hemorrhoidal in the past. ? ?# DISPOSITION- ?# add iron studies/ferritin to today labs ?#  Ok with Sandostatin today;HOLD venofer ?# sandostatin in 1 months ?# Follow up in 2  months MD cbc/cmp/chromogranin- -Sandstatin; possible venofer- Dr.B ?

## 2021-04-11 NOTE — Progress Notes (Signed)
[place] ?OFFICE PROGRESS NOTE ? ?Patient Care Team: ?Emma Pink, [name] ?Emma Graff, [name] ?Emma Cowman, [name] ?Emma Ramsay, [name] ? ? Cancer Staging  ?No matching staging information was found for the patient. ? ? ?Oncology History Overview Note  ?# JUNE 2015-MESENTERIC MASS [~5-6cm] Presumed CARCINOID with mets to liver [AUG 2016-Octreoscan; EmmaHurwitz; [facility]]; FEB 2016- START OCTREOTIDE LAR qM; Octreo scan- FEB 2017- STABLE; cont Octreotide'; OCT 5th OCTREO SCAN- STABLE; mesenteric mass present.  ? ?# FEB 2018- [place] PET- uptake in liver/ mesenteric mass; NOV 2019- increase sandostatin to 41m q monthly.  ? ?#S/p Lutathera x4 every 2 months; last treatment mid September 2022 [Emmaedmunds]; OCT 2022- PET STABLE.  ? ?# Jan/FEB 2018- Liver MRI- "fat sparing" Bx- neg; no further wu recom ? 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 ?# IDA s/p IV iron; last March 2014; [place] [EmmaElliot; Jan 2016] colon angiotele- s/p Argon laser; April 2017- [facility]rahem ? ?# DVT [taken off eliquis DEC 2017]; NOV -DEC 2017 CHF/ UTI with sepsis- stone extracted [EmmaBrandon] ? ?# Thyroid nodule- MNG s/p Bx [NOV 2016]/ right adnexal mass [stable since 2010]; hard of hearing ? ?MOLECULAR TESTING- Not done ? ?GENETIC TESTING- [place] ? Sig;  ?-------------------------------------------------   ? ?DIAGNOSIS: [ ]  Carcinoid ? ?STAGE: 4       ;GOALS: Palliative ? ?CURRENT/MOST RECENT THERAPY: Monthly Sandostatin ? ?  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  ? ?  ? ?INTERVAL  HISTORY: Patient is a poor historian/hard of hearing.  In a wheelchair.  Accompanied by granddaughter. ? ?[name].  female pleasant patient above history of metastatic carcinoid tumor currently on Sandostatin/; Lutathera infusions every 2 months ; iron deficient anemia question AV malformation is here for follow-up. ? ?In the interim patient was admitted to the hospital for A-fib with RVR.  S/p Cardizem drip.  Also on Eliquis. ? ?Patient denies any worsening abdominal pain.  Chronic intermittent diarrhea. No weight loss.  There is no blood in stools or black or stool.  ? ?Review of Systems  ?Constitutional:  Positive for malaise/fatigue. Negative for chills, diaphoresis, fever and weight loss.  ?HENT:  Negative for nosebleeds and sore throat.   ?Eyes:  Negative for double vision.  ?Respiratory:  Negative for hemoptysis, sputum production, shortness of breath and wheezing.   ?Cardiovascular:  Negative for chest pain, palpitations and orthopnea.  ?Gastrointestinal:  Positive for abdominal pain and diarrhea. Negative for constipation, heartburn, melena, nausea and vomiting.  ?Genitourinary:  Negative for dysuria, frequency and urgency.  ?Musculoskeletal:  Positive for back pain. Negative for joint pain.  ?Skin: Negative.  Negative for itching and rash.  ?Neurological:  Negative for dizziness, tingling, focal weakness, weakness and headaches.  ?Endo/Heme/Allergies:  Does not bruise/bleed easily.  ?Psychiatric/Behavioral:  Negative for depression. The patient is not nervous/anxious and does not have insomnia.   ?  ? ?PAST MEDICAL HISTORY :  ?Past Medical History:  ?Diagnosis Date  ?? Aromatase inhibitor use   ?? Atrial fibrillation ([place]   ?? Breast cancer ([place]  ? left breast cancer/radiation  ?? Breast  cancer, left breast ([place]) 06/19/2014  ?? CHF (congestive heart failure) ([place])   ? recent sepsis  ?? Chronic kidney disease   ?? Dizziness   ?? Dyspnea   ? recently while admitted  ?? GERD  (gastroesophageal reflux disease)   ?? Hearing loss   ?? History of hiatal hernia   ?? History of kidney stones   ?? HOH (hard of hearing)   ? severe  ?? Hypertension   ?? Hypothyroidism   ?? Leg DVT (deep venous thromboembolism), acute ([place]) 07/2015  ? left leg after fracture  ?? 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  ?? Pain   ? chest pain while admitted  ?? Personal history of radiation therapy 2015  ? left breast ca  ?? Thyroid disease   ? ? ?PAST SURGICAL HISTORY :   ?Past Surgical History:  ?Procedure Laterality Date  ?? ABDOMINAL HYSTERECTOMY    ? partial  ?? BREAST LUMPECTOMY Left 01/13/2013  ? invasive mammary carcinoma, clear margins, LN negative  ?? BREAST LUMPECTOMY WITH SENTINEL LYMPH NODE BIOPSY Left 2014  ?? CHOLECYSTECTOMY    ?? COLONOSCOPY WITH PROPOFOL N/A 09/17/2017  ? Procedure: COLONOSCOPY WITH PROPOFOL;  Surgeon: Emma Bellows, MD;  Location: [facility] ENDOSCOPY;  Service: Gastroenterology;  Laterality: N/A;  ?? CYSTOSCOPY W/ URETERAL STENT PLACEMENT Right 01/07/2016  ? Procedure: CYSTOSCOPY WITH STENT REPLACEMENT;  Surgeon: Emma Espy, MD;  Location: [facility] ORS;  Service: Urology;  Laterality: Right;  ?? CYSTOSCOPY WITH RETROGRADE PYELOGRAM, URETEROSCOPY AND STENT PLACEMENT Right 12/19/2015  ? Procedure: CYSTOSCOPY WITH RETROGRADE PYELOGRAM, URETEROSCOPY AND STENT PLACEMENT;  Surgeon: Emma Hughs, MD;  Location: [facility] ORS;  Service: Urology;  Laterality: Right;  ?? URETEROSCOPY WITH HOLMIUM LASER LITHOTRIPSY Right 01/07/2016  ? Procedure: URETEROSCOPY WITH HOLMIUM LASER LITHOTRIPSY;  Surgeon: Emma Espy, MD;  Location: [facility] ORS;  Service: Urology;  Laterality: Right;  ? ? ?FAMILY HISTORY :   ?Family History  ?Problem Relation Age of Onset  ?? Breast cancer Mother 98  ?     deceased 107  ?? Breast cancer Maternal Aunt 68  ?     deceased 40s  ?? Prostate cancer Father   ?     deceased 48  ?? Colon cancer Paternal Aunt 29  ?     deceased 18s  ? ? ?SOCIAL HISTORY:   ?Social History  ? ?Tobacco Use  ??  Smoking status: Never  ?? Smokeless tobacco: Never  ?Vaping Use  ?? Vaping Use: Never used  ?Substance Use Topics  ?? Alcohol use: No  ?  Alcohol/week: 0.0 standard drinks  ?? Drug use: No  ? ? ?ALLERGIES:  is allergic to sulfa antibiotics. ? ?MEDICATIONS:  ?Current Outpatient Medications  ?Medication Sig Dispense Refill  ?? apixaban (ELIQUIS) 5 MG TABS tablet Take 1 tablet (5 mg total) by mouth 2 (two) times daily. 60 tablet 0  ?? diltiazem (CARDIZEM CD) 180 MG 24 hr capsule Take 1 capsule (180 mg total) by mouth daily. 30 capsule 0  ?? furosemide (LASIX) 40 MG tablet Take 40 mg by mouth daily.    ?? labetalol (NORMODYNE) 200 MG tablet Take 200 mg by mouth 2 (two) times daily.     ?? levothyroxine (SYNTHROID) 88 MCG tablet Take 88 mcg by mouth daily before breakfast.     ?? losartan-hydrochlorothiazide (HYZAAR) 100-25 MG tablet Take 1 tablet by mouth daily.    ?? nystatin (MYCOSTATIN/NYSTOP) powder APPLY TO AFFECTED AREA TWICE A DAY 15 g 3  ?? pantoprazole (PROTONIX) 40 MG tablet TAKE  1 TABLET BY MOUTH EVERY DAY 90 tablet 0  ?? potassium chloride SA (KLOR-CON M20) 20 MEQ tablet TAKE 1 TABLET BY MOUTH EVERY DAY 90 tablet 2  ?? traMADol (ULTRAM) 50 MG tablet 0.5 tablet to 1 tablet every 8 hours as needed for pain 30 tablet 0  ?? Vitamin D, Ergocalciferol, (DRISDOL) 1.25 MG (50000 UNIT) CAPS capsule TAKE 1 CAPSULE BY MOUTH ONE TIME PER WEEK 12 capsule 3  ? ?No current facility-administered medications for this visit.  ? ? ?PHYSICAL EXAMINATION: ?ECOG PERFORMANCE STATUS: 1 - Symptomatic but completely ambulatory ? ?BP (!) 117/52 (BP Location: Right Arm, Patient Position: Sitting, Cuff Size: Large)   Pulse (!) 53   Temp 97.6 ?F (36.4 ?C) (Oral)   Ht 5' 5"  (1.651 m)   Wt 216 lb 12.8 oz (98.3 kg)   SpO2 98%   BMI 36.08 kg/m?  ? ?Filed Weights  ? 04/11/21 1124  ?Weight: 216 lb 12.8 oz (98.3 kg)  ? ? ? ?Physical Exam ?HENT:  ?   Head: Normocephalic and atraumatic.  ?   Mouth/Throat:  ?   Pharynx: No oropharyngeal  exudate.  ?Eyes:  ?   Pupils: Pupils are equal, round, and reactive to light.  ?Cardiovascular:  ?   Rate and Rhythm: Normal rate. Rhythm irregular.  ?Pulmonary:  ?   Effort: Pulmonary effort is normal. No respiratory distress.

## 2021-04-16 LAB — CHROMOGRANIN A: Chromogranin A (ng/mL): 101.5 ng/mL (ref 0.0–101.8)

## 2021-04-24 ENCOUNTER — Other Ambulatory Visit: Payer: Self-pay | Admitting: Internal Medicine

## 2021-04-24 DIAGNOSIS — C7A019 Malignant carcinoid tumor of the small intestine, unspecified portion: Secondary | ICD-10-CM

## 2021-04-24 DIAGNOSIS — Z79811 Long term (current) use of aromatase inhibitors: Secondary | ICD-10-CM

## 2021-05-09 ENCOUNTER — Inpatient Hospital Stay: Payer: Medicare Other | Attending: Internal Medicine

## 2021-05-09 DIAGNOSIS — C7A098 Malignant carcinoid tumors of other sites: Secondary | ICD-10-CM | POA: Diagnosis present

## 2021-05-09 DIAGNOSIS — K6389 Other specified diseases of intestine: Secondary | ICD-10-CM

## 2021-05-09 DIAGNOSIS — E340 Carcinoid syndrome: Secondary | ICD-10-CM | POA: Diagnosis not present

## 2021-05-09 DIAGNOSIS — C7B02 Secondary carcinoid tumors of liver: Secondary | ICD-10-CM | POA: Insufficient documentation

## 2021-05-09 MED ORDER — OCTREOTIDE ACETATE 30 MG IM KIT
30.0000 mg | PACK | Freq: Once | INTRAMUSCULAR | Status: AC
  Administered 2021-05-09: 30 mg via INTRAMUSCULAR
  Filled 2021-05-09: qty 1

## 2021-06-07 ENCOUNTER — Inpatient Hospital Stay: Payer: Medicare Other

## 2021-06-07 ENCOUNTER — Encounter: Payer: Self-pay | Admitting: Internal Medicine

## 2021-06-07 ENCOUNTER — Inpatient Hospital Stay: Payer: Medicare Other | Attending: Internal Medicine

## 2021-06-07 ENCOUNTER — Inpatient Hospital Stay (HOSPITAL_BASED_OUTPATIENT_CLINIC_OR_DEPARTMENT_OTHER): Payer: Medicare Other | Admitting: Internal Medicine

## 2021-06-07 DIAGNOSIS — D509 Iron deficiency anemia, unspecified: Secondary | ICD-10-CM | POA: Diagnosis not present

## 2021-06-07 DIAGNOSIS — Z79899 Other long term (current) drug therapy: Secondary | ICD-10-CM | POA: Diagnosis not present

## 2021-06-07 DIAGNOSIS — Z8042 Family history of malignant neoplasm of prostate: Secondary | ICD-10-CM | POA: Diagnosis not present

## 2021-06-07 DIAGNOSIS — Z7901 Long term (current) use of anticoagulants: Secondary | ICD-10-CM | POA: Insufficient documentation

## 2021-06-07 DIAGNOSIS — C7A019 Malignant carcinoid tumor of the small intestine, unspecified portion: Secondary | ICD-10-CM | POA: Diagnosis present

## 2021-06-07 DIAGNOSIS — C7B02 Secondary carcinoid tumors of liver: Secondary | ICD-10-CM | POA: Diagnosis present

## 2021-06-07 DIAGNOSIS — Z803 Family history of malignant neoplasm of breast: Secondary | ICD-10-CM | POA: Diagnosis not present

## 2021-06-07 DIAGNOSIS — Z923 Personal history of irradiation: Secondary | ICD-10-CM | POA: Insufficient documentation

## 2021-06-07 DIAGNOSIS — Z800 Family history of malignant neoplasm of digestive organs: Secondary | ICD-10-CM | POA: Diagnosis not present

## 2021-06-07 DIAGNOSIS — I4891 Unspecified atrial fibrillation: Secondary | ICD-10-CM | POA: Insufficient documentation

## 2021-06-07 DIAGNOSIS — Z853 Personal history of malignant neoplasm of breast: Secondary | ICD-10-CM | POA: Insufficient documentation

## 2021-06-07 LAB — CBC WITH DIFFERENTIAL/PLATELET
Abs Immature Granulocytes: 0.02 10*3/uL (ref 0.00–0.07)
Basophils Absolute: 0 10*3/uL (ref 0.0–0.1)
Basophils Relative: 1 %
Eosinophils Absolute: 0.3 10*3/uL (ref 0.0–0.5)
Eosinophils Relative: 5 %
HCT: 32 % — ABNORMAL LOW (ref 36.0–46.0)
Hemoglobin: 10.6 g/dL — ABNORMAL LOW (ref 12.0–15.0)
Immature Granulocytes: 0 %
Lymphocytes Relative: 8 %
Lymphs Abs: 0.4 10*3/uL — ABNORMAL LOW (ref 0.7–4.0)
MCH: 31.7 pg (ref 26.0–34.0)
MCHC: 33.1 g/dL (ref 30.0–36.0)
MCV: 95.8 fL (ref 80.0–100.0)
Monocytes Absolute: 0.7 10*3/uL (ref 0.1–1.0)
Monocytes Relative: 13 %
Neutro Abs: 4.2 10*3/uL (ref 1.7–7.7)
Neutrophils Relative %: 73 %
Platelets: 181 10*3/uL (ref 150–400)
RBC: 3.34 MIL/uL — ABNORMAL LOW (ref 3.87–5.11)
RDW: 13.6 % (ref 11.5–15.5)
WBC: 5.7 10*3/uL (ref 4.0–10.5)
nRBC: 0 % (ref 0.0–0.2)

## 2021-06-07 LAB — COMPREHENSIVE METABOLIC PANEL
ALT: 13 U/L (ref 0–44)
AST: 23 U/L (ref 15–41)
Albumin: 3.7 g/dL (ref 3.5–5.0)
Alkaline Phosphatase: 74 U/L (ref 38–126)
Anion gap: 10 (ref 5–15)
BUN: 17 mg/dL (ref 8–23)
CO2: 22 mmol/L (ref 22–32)
Calcium: 9.2 mg/dL (ref 8.9–10.3)
Chloride: 105 mmol/L (ref 98–111)
Creatinine, Ser: 0.81 mg/dL (ref 0.44–1.00)
GFR, Estimated: >60 mL/min (ref >60)
Glucose, Bld: 131 mg/dL — ABNORMAL HIGH (ref 70–99)
Potassium: 3.7 mmol/L (ref 3.5–5.1)
Sodium: 137 mmol/L (ref 135–145)
Total Bilirubin: 1 mg/dL (ref 0.3–1.2)
Total Protein: 6.7 g/dL (ref 6.5–8.1)

## 2021-06-07 MED ORDER — OCTREOTIDE ACETATE 30 MG IM KIT
30.0000 mg | PACK | Freq: Once | INTRAMUSCULAR | Status: AC
  Administered 2021-06-07: 30 mg via INTRAMUSCULAR
  Filled 2021-06-07: qty 1

## 2021-06-07 NOTE — Progress Notes (Signed)
NO Venofer today per MD ?

## 2021-06-07 NOTE — Assessment & Plan Note (Addendum)
#   Non- functional Small bowel/mesenteric carcinoid; s/p Lutathera + sandostatin x4 infusions.  October 2022-PET scan shows no evidence of progression of disease or any evidence of new disease; liver metastases-slight decrease in size. ? ?#Continue Sandostatin q M. Labs today reviewed; see below re: anemia  ? 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  ? ?# chronic mild diarrhea- ? Sec to carcinoid [not on xarmelo secondary insurance issues- -STABLE; ? ?# A.fib 2023; March- on eliquis [Dr.Parashoes Cardiologist]; awaiting to drop off heart monitor at A.fib clinic.  Monitor closely given history of rectal bleeding/?  Hemorrhoidal in the past. ? ?# DISPOSITION- ?#  Ok with Sandostatin today; ?# 1 week venofer ?# Follow up in 1 months MD cbc/cmp/chromogranin; iron studies;ferritin-- -Sandstatin; possible venofer- Dr.B ?

## 2021-06-07 NOTE — Progress Notes (Signed)
[place] ?OFFICE PROGRESS NOTE ? ?Patient Care Team: ?[name], [name] ?[name], [name] ?[name], [name] ?[name], [name] ? ? Cancer Staging  ?No matching staging information was found for the patient. ? ? ?Oncology History Overview Note  ?# JUNE 2015-MESENTERIC MASS [~5-6cm] Presumed CARCINOID with mets to liver [AUG 2016-Octreoscan; Dr.Hurwitz; [facility]]; FEB 2016- START OCTREOTIDE LAR qM; Octreo scan- FEB 2017- STABLE; cont Octreotide'; OCT 5th OCTREO SCAN- STABLE; mesenteric mass present.  ? ?# FEB 2018- Ga PET- uptake in liver/ mesenteric mass; NOV 2019- increase sandostatin to 71m q monthly.  ? ?#S/p Lutathera x4 every 2 months; last treatment mid September 2022 [Dr.edmunds]; OCT 2022- PET STABLE.  ? ?# Jan/FEB 2018- Liver MRI- "fat sparing" Bx- neg; no further wu recom ? 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 ?# IDA s/p IV iron; last March 2014; Colo [Dr.Elliot; Jan 2016] colon angiotele- s/p Argon laser; April 2017- Ferrahem ? ?# DVT [taken off eliquis DEC 2017]; NOV -DEC 2017 CHF/ UTI with sepsis- stone extracted [Dr.Brandon] ? ?# Thyroid nodule- MNG s/p Bx [NOV 2016]/ right adnexal mass [stable since 2010]; hard of hearing ? ?MOLECULAR TESTING- Not done ? ?[place]ETIC TESTING- [place] ? Sig;  ?-------------------------------------------------   ? ?DIAGNOSIS: _0  Carcinoid ? ?STAGE: 4       ;GOALS: Palliative ? ?CURRENT/MOST RECENT THERAPY: Monthly Sandostatin ? ?  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  ? ?  ? ?INTERVAL  HISTORY: Patient is a poor historian/hard of hearing.  In a wheelchair.  Alone.  ? ?BALLEXA [id number].o.  female pleasant patient above history of metastatic carcinoid tumor currently on Sandostatin/; Lutathera infusions every 2 months ; iron deficient anemia question AV malformation is here for follow-up. ? ?In the interim patient has not had any hospitalizations.  However she has been followed closely by cardiology after A-fib on Eliquis. ? ?Patient denies any worsening abdominal pain.  Chronic intermittent diarrhea. No weight loss.  There is no blood in stools or black or stool.  ? ?Review of Systems  ?Constitutional:  Positive for malaise/fatigue. Negative for chills, diaphoresis, fever and weight loss.  ?HENT:  Negative for nosebleeds and sore throat.   ?Eyes:  Negative for double vision.  ?Respiratory:  Negative for hemoptysis, sputum production, shortness of breath and wheezing.   ?Cardiovascular:  Negative for chest pain, palpitations and orthopnea.  ?Gastrointestinal:  Positive for abdominal pain and diarrhea. Negative for constipation, heartburn, melena, nausea and vomiting.  ?Genitourinary:  Negative for dysuria, frequency and urgency.  ?Musculoskeletal:  Positive for back pain. Negative for joint pain.  ?Skin: Negative.  Negative for itching and rash.  ?Neurological:  Negative for dizziness, tingling, focal weakness, weakness and headaches.  ?Endo/Heme/Allergies:  Does not bruise/bleed easily.  ?Psychiatric/Behavioral:  Negative for depression. The patient is not nervous/anxious and does not have insomnia.   ?  ? ?PAST MEDICAL HISTORY :  ?Past Medical History:  ?Diagnosis Date  ? Aromatase inhibitor use   ? Atrial fibrillation ([place]   ? Breast cancer ([place] 2014  ? left breast cancer/radiation  ? Breast  cancer, left breast ([place]) 06/19/2014  ? CHF (congestive heart failure) ([place])   ? recent sepsis  ? Chronic kidney disease   ? Dizziness   ? Dyspnea   ? recently while admitted  ? GERD (gastroesophageal  reflux disease)   ? Hearing loss   ? History of hiatal hernia   ? History of kidney stones   ? HOH (hard of hearing)   ? severe  ? Hypertension   ? Hypothyroidism   ? Leg DVT (deep venous thromboembolism), acute ([place]) 07/2015  ? left leg after fracture  ? 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  ? Pain   ? chest pain while admitted  ? Personal history of radiation therapy 2015  ? left breast ca  ? Thyroid disease   ? ? ?PAST SURGICAL HISTORY :   ?Past Surgical History:  ?Procedure Laterality Date  ? ABDOMINAL HYSTERECTOMY    ? partial  ? BREAST LUMPECTOMY Left 01/13/2013  ? invasive mammary carcinoma, clear margins, LN negative  ? BREAST LUMPECTOMY WITH SENTINEL LYMPH NODE BIOPSY Left 2014  ? CHOLECYSTECTOMY    ? COLONOSCOPY WITH PROPOFOL N/A 09/17/2017  ? Procedure: COLONOSCOPY WITH PROPOFOL;  Surgeon: Jonathon Bellows, MD;  Location: [facility] ENDOSCOPY;  Service: Gastroenterology;  Laterality: N/A;  ? CYSTOSCOPY W/ URETERAL STENT PLACEMENT Right 01/07/2016  ? Procedure: CYSTOSCOPY WITH STENT REPLACEMENT;  Surgeon: Hollice Espy, MD;  Location: [facility] ORS;  Service: Urology;  Laterality: Right;  ? CYSTOSCOPY WITH RETROGRADE PYELOGRAM, URETEROSCOPY AND STENT PLACEMENT Right 12/19/2015  ? Procedure: CYSTOSCOPY WITH RETROGRADE PYELOGRAM, URETEROSCOPY AND STENT PLACEMENT;  Surgeon: Ardis Hughs, MD;  Location: [facility] ORS;  Service: Urology;  Laterality: Right;  ? URETEROSCOPY WITH HOLMIUM LASER LITHOTRIPSY Right 01/07/2016  ? Procedure: URETEROSCOPY WITH HOLMIUM LASER LITHOTRIPSY;  Surgeon: Hollice Espy, MD;  Location: [facility] ORS;  Service: Urology;  Laterality: Right;  ? ? ?FAMILY HISTORY :   ?Family History  ?Problem Relation Age of Onset  ? Breast cancer Mother 66  ?     deceased 79  ? Breast cancer Maternal Aunt 68  ?     deceased 45s  ? Prostate cancer Father   ?     deceased 51  ? Colon cancer Paternal Aunt 65  ?     deceased 31s  ? ? ?SOCIAL HISTORY:   ?Social History  ? ?Tobacco Use  ? Smoking status: Never  ? Smokeless tobacco:  Never  ?Vaping Use  ? Vaping Use: Never used  ?Substance Use Topics  ? Alcohol use: No  ?  Alcohol/week: 0.0 standard drinks  ? Drug use: No  ? ? ?ALLERGIES:  is allergic to sulfa antibiotics. ? ?MEDICATIONS:  ?Current Outpatient Medications  ?Medication Sig Dispense Refill  ? apixaban (ELIQUIS) 5 MG TABS tablet Take 1 tablet (5 mg total) by mouth 2 (two) times daily. 60 tablet 0  ? diltiazem (CARDIZEM CD) 180 MG 24 hr capsule Take 1 capsule (180 mg total) by mouth daily. 30 capsule 0  ? furosemide (LASIX) 40 MG tablet Take 40 mg by mouth daily.    ? labetalol (NORMODYNE) 200 MG tablet Take 200 mg by mouth 2 (two) times daily.     ? levothyroxine (SYNTHROID) 88 MCG tablet Take 88 mcg by mouth daily before breakfast.     ? losartan-hydrochlorothiazide (HYZAAR) 100-25 MG tablet Take 1 tablet by mouth daily.    ? nystatin (MYCOSTATIN/NYSTOP) powder APPLY TO AFFECTED AREA TWICE A DAY 15 g 3  ? pantoprazole (PROTONIX) 40 MG tablet TAKE  1 TABLET BY MOUTH EVERY DAY 90 tablet 0  ? potassium chloride SA (KLOR-CON M20) 20 MEQ tablet TAKE 1 TABLET BY MOUTH EVERY DAY 90 tablet 2  ? traMADol (ULTRAM) 50 MG tablet 0.5 tablet to 1 tablet every 8 hours as needed for pain 30 tablet 0  ? Vitamin D, Ergocalciferol, (DRISDOL) 1.25 MG (50000 UNIT) CAPS capsule TAKE 1 CAPSULE BY MOUTH ONE TIME PER WEEK 12 capsule 3  ? ?No current facility-administered medications for this visit.  ? ? ?PHYSICAL EXAMINATION: ?ECOG PERFORMANCE STATUS: 1 - Symptomatic but completely ambulatory ? ?BP (!) 115/57   Pulse (!) 51   Temp (!) 96.4 ?F (35.8 ?C)   Resp 18   Wt 213 lb 12.8 oz (97 kg)   SpO2 94%   BMI 35.58 kg/m?  ? ?Filed Weights  ? 06/07/21 1300  ?Weight: 213 lb 12.8 oz (97 kg)  ? ? ? ?Physical Exam ?HENT:  ?   Head: Normocephalic and atraumatic.  ?   Mouth/Throat:  ?   Pharynx: No oropharyngeal exudate.  ?Eyes:  ?   Pupils: Pupils are equal, round, and reactive to light.  ?Cardiovascular:  ?   Rate and Rhythm: Normal rate. Rhythm irregular.   ?Pulmonary:  ?   Effort: Pulmonary effort is normal. No respiratory distress.  ?   Breath sounds: Normal breath sounds. No wheezing.  ?Abdominal:  ?   General: Bowel sounds are normal. There is no distension.  ?

## 2021-06-07 NOTE — Progress Notes (Signed)
Patient is currently undergoing close follow up with cardiology.  Does have occasional low abdominal pain that she feels correlates with cardiac issues.  Reports diarrhea is stable ?

## 2021-06-10 LAB — CHROMOGRANIN A: Chromogranin A (ng/mL): 80.7 ng/mL (ref 0.0–101.8)

## 2021-06-11 ENCOUNTER — Inpatient Hospital Stay: Payer: Medicare Other

## 2021-06-11 VITALS — BP 120/52 | HR 46 | Temp 97.4°F | Resp 18

## 2021-06-11 DIAGNOSIS — C7A019 Malignant carcinoid tumor of the small intestine, unspecified portion: Secondary | ICD-10-CM | POA: Diagnosis not present

## 2021-06-11 DIAGNOSIS — K6389 Other specified diseases of intestine: Secondary | ICD-10-CM

## 2021-06-11 MED ORDER — IRON SUCROSE 20 MG/ML IV SOLN
200.0000 mg | Freq: Once | INTRAVENOUS | Status: AC
  Administered 2021-06-11: 200 mg via INTRAVENOUS
  Filled 2021-06-11: qty 10

## 2021-06-11 MED ORDER — SODIUM CHLORIDE 0.9 % IV SOLN
Freq: Once | INTRAVENOUS | Status: AC
  Filled 2021-06-11: qty 250

## 2021-06-11 NOTE — Progress Notes (Signed)
Pt tolerated venofer infusion well with no problems or complaints.  Pt left infusion suite stable in a wheelchair.  

## 2021-06-11 NOTE — Patient Instructions (Signed)

## 2021-07-08 ENCOUNTER — Inpatient Hospital Stay: Payer: Medicare Other

## 2021-07-08 ENCOUNTER — Inpatient Hospital Stay: Payer: Medicare Other | Attending: Internal Medicine

## 2021-07-08 ENCOUNTER — Inpatient Hospital Stay: Payer: Medicare Other | Admitting: Internal Medicine

## 2021-07-08 ENCOUNTER — Encounter: Payer: Self-pay | Admitting: Nurse Practitioner

## 2021-07-08 ENCOUNTER — Inpatient Hospital Stay (HOSPITAL_BASED_OUTPATIENT_CLINIC_OR_DEPARTMENT_OTHER): Payer: Medicare Other | Admitting: Nurse Practitioner

## 2021-07-08 ENCOUNTER — Other Ambulatory Visit: Payer: Self-pay

## 2021-07-08 VITALS — BP 103/58 | HR 57 | Temp 96.7°F | Resp 16 | Ht 65.0 in | Wt 211.0 lb

## 2021-07-08 VITALS — BP 116/53 | HR 56

## 2021-07-08 DIAGNOSIS — K6389 Other specified diseases of intestine: Secondary | ICD-10-CM

## 2021-07-08 DIAGNOSIS — D509 Iron deficiency anemia, unspecified: Secondary | ICD-10-CM | POA: Insufficient documentation

## 2021-07-08 DIAGNOSIS — E340 Carcinoid syndrome: Secondary | ICD-10-CM | POA: Diagnosis not present

## 2021-07-08 DIAGNOSIS — I4891 Unspecified atrial fibrillation: Secondary | ICD-10-CM | POA: Insufficient documentation

## 2021-07-08 DIAGNOSIS — C7A092 Malignant carcinoid tumor of the stomach: Secondary | ICD-10-CM | POA: Insufficient documentation

## 2021-07-08 DIAGNOSIS — C7A019 Malignant carcinoid tumor of the small intestine, unspecified portion: Secondary | ICD-10-CM | POA: Diagnosis not present

## 2021-07-08 DIAGNOSIS — D500 Iron deficiency anemia secondary to blood loss (chronic): Secondary | ICD-10-CM

## 2021-07-08 DIAGNOSIS — C7B02 Secondary carcinoid tumors of liver: Secondary | ICD-10-CM | POA: Insufficient documentation

## 2021-07-08 DIAGNOSIS — Z7901 Long term (current) use of anticoagulants: Secondary | ICD-10-CM | POA: Insufficient documentation

## 2021-07-08 LAB — COMPREHENSIVE METABOLIC PANEL
ALT: 14 U/L (ref 0–44)
AST: 22 U/L (ref 15–41)
Albumin: 3.7 g/dL (ref 3.5–5.0)
Alkaline Phosphatase: 81 U/L (ref 38–126)
Anion gap: 8 (ref 5–15)
BUN: 19 mg/dL (ref 8–23)
CO2: 22 mmol/L (ref 22–32)
Calcium: 9.2 mg/dL (ref 8.9–10.3)
Chloride: 107 mmol/L (ref 98–111)
Creatinine, Ser: 0.84 mg/dL (ref 0.44–1.00)
GFR, Estimated: >60 mL/min (ref >60)
Glucose, Bld: 141 mg/dL — ABNORMAL HIGH (ref 70–99)
Potassium: 3.5 mmol/L (ref 3.5–5.1)
Sodium: 137 mmol/L (ref 135–145)
Total Bilirubin: 0.6 mg/dL (ref 0.3–1.2)
Total Protein: 6.9 g/dL (ref 6.5–8.1)

## 2021-07-08 LAB — IRON AND TIBC
Iron: 56 ug/dL (ref 28–170)
Saturation Ratios: 15 % (ref 10.4–31.8)
TIBC: 386 ug/dL (ref 250–450)
UIBC: 330 ug/dL

## 2021-07-08 LAB — CBC WITH DIFFERENTIAL/PLATELET
Abs Immature Granulocytes: 0.02 10*3/uL (ref 0.00–0.07)
Basophils Absolute: 0 10*3/uL (ref 0.0–0.1)
Basophils Relative: 1 %
Eosinophils Absolute: 0.3 10*3/uL (ref 0.0–0.5)
Eosinophils Relative: 6 %
HCT: 32.2 % — ABNORMAL LOW (ref 36.0–46.0)
Hemoglobin: 10.6 g/dL — ABNORMAL LOW (ref 12.0–15.0)
Immature Granulocytes: 0 %
Lymphocytes Relative: 8 %
Lymphs Abs: 0.4 10*3/uL — ABNORMAL LOW (ref 0.7–4.0)
MCH: 31 pg (ref 26.0–34.0)
MCHC: 32.9 g/dL (ref 30.0–36.0)
MCV: 94.2 fL (ref 80.0–100.0)
Monocytes Absolute: 0.6 10*3/uL (ref 0.1–1.0)
Monocytes Relative: 12 %
Neutro Abs: 3.6 10*3/uL (ref 1.7–7.7)
Neutrophils Relative %: 73 %
Platelets: 193 10*3/uL (ref 150–400)
RBC: 3.42 MIL/uL — ABNORMAL LOW (ref 3.87–5.11)
RDW: 13.9 % (ref 11.5–15.5)
WBC: 4.9 10*3/uL (ref 4.0–10.5)
nRBC: 0 % (ref 0.0–0.2)

## 2021-07-08 LAB — FERRITIN: Ferritin: 27 ng/mL (ref 11–307)

## 2021-07-08 MED ORDER — IRON SUCROSE 20 MG/ML IV SOLN
200.0000 mg | Freq: Once | INTRAVENOUS | Status: AC
  Administered 2021-07-08: 200 mg via INTRAVENOUS
  Filled 2021-07-08: qty 10

## 2021-07-08 MED ORDER — SODIUM CHLORIDE 0.9 % IV SOLN
Freq: Once | INTRAVENOUS | Status: AC
  Filled 2021-07-08: qty 250

## 2021-07-08 MED ORDER — OCTREOTIDE ACETATE 30 MG IM KIT
30.0000 mg | PACK | Freq: Once | INTRAMUSCULAR | Status: AC
  Administered 2021-07-08: 30 mg via INTRAMUSCULAR
  Filled 2021-07-08: qty 1

## 2021-07-08 NOTE — Progress Notes (Signed)
[place] OFFICE PROGRESS NOTE  [name], [name] [name], [name] [name], [name] [name], [name]   Cancer Staging  No matching staging information was found for the patient.   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   # FEB 2018- [place] PET- uptake in liver/ mesenteric mass; NOV 2019- increase sandostatin to 38m q monthly.   #S/p Lutathera x4 every 2 months; last treatment mid September 2022 [Dr.edmunds]; OCT 2022- PET STABLE.   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 In a wheelchair. Alone.   [name].o.  female pleasant patient above history of metastatic carcinoid tumor currently on Sandostatin/Lutathera infusions every 2 months; iron deficient anemia, question AV malformation, is here for follow-up.   No interval hospitalizations. Denies black or bloody stools. Has chronic intermittent diarrhea which is unchanged. No abdominal pain, weight loss, flushing. She denies complaints today.   Review of Systems  Constitutional:  Positive for malaise/fatigue. Negative for chills, fever and weight loss.  HENT:  Positive for hearing loss. Negative for nosebleeds, sore throat and tinnitus.   Eyes:  Negative for blurred vision and double vision.  Respiratory:  Negative for cough, hemoptysis, shortness of breath and wheezing.   Cardiovascular:  Negative for chest pain, palpitations and leg swelling.  Gastrointestinal:  Positive for diarrhea. Negative for abdominal pain, blood in stool, constipation, melena, nausea and vomiting.  Genitourinary:  Negative for dysuria and urgency.  Musculoskeletal:  Positive for back pain. Negative for falls, joint pain and myalgias.  Skin:  Negative for itching and rash.  Neurological:  Negative for dizziness, tingling, sensory change, loss of consciousness, weakness and headaches.  Endo/Heme/Allergies:  Negative for environmental allergies. Does not bruise/bleed easily.  Psychiatric/Behavioral:  Negative for depression. The patient is not nervous/anxious and does not have insomnia.      PAST MEDICAL HISTORY :  Past Medical History:  Diagnosis Date   Aromatase inhibitor use    Atrial fibrillation ([place]    Breast cancer ([place] 2014   left breast cancer/radiation   Breast cancer, left breast ([place] 06/19/2014   CHF (congestive heart failure) (  [place])    recent sepsis   Chronic kidney disease    Dizziness    Dyspnea    recently while admitted   GERD (gastroesophageal  reflux disease)    Hearing loss    History of hiatal hernia    History of kidney stones    HOH (hard of hearing)    severe   Hypertension    Hypothyroidism    Leg DVT (deep venous thromboembolism), acute ([place]) 07/2015   left leg after fracture   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   Pain    chest pain while admitted   Personal history of radiation therapy 2015   left breast ca   Thyroid disease     PAST SURGICAL HISTORY :   Past Surgical History:  Procedure Laterality Date   ABDOMINAL HYSTERECTOMY     partial   BREAST LUMPECTOMY Left 01/13/2013   invasive mammary carcinoma, clear margins, LN negative   BREAST LUMPECTOMY WITH SENTINEL LYMPH NODE BIOPSY Left 2014   CHOLECYSTECTOMY     COLONOSCOPY WITH PROPOFOL N/A 09/17/2017   Procedure: COLONOSCOPY WITH PROPOFOL;  Surgeon: Jonathon Bellows, MD;  Location[facility] ENDOSCOPY;  Service: Gastroenterology;  Laterality: N/A;   CYSTOSCOPY W/ URETERAL STENT PLACEMENT Right 01/07/2016   Procedure: CYSTOSCOPY WITH STENT REPLACEMENT;  Surgeon: Hollice Espy, MD;  Location: [facility] ORS;  Service: Urology;  Laterality: Right;   CYSTOSCOPY WITH RETROGRADE PYELOGRAM, URETEROSCOPY AND STENT PLACEMENT Right 12/19/2015   Procedure: CYSTOSCOPY WITH RETROGRADE PYELOGRAM, URETEROSCOPY AND STENT PLACEMENT;  Surgeon: Ardis Hughs, MD;  Location: [facility] ORS;  Service: Urology;  Laterality: Right;   URETEROSCOPY WITH HOLMIUM LASER LITHOTRIPSY Right 01/07/2016   Procedure: URETEROSCOPY WITH HOLMIUM LASER LITHOTRIPSY;  Surgeon: Hollice Espy, MD;  Location: [facility] ORS;  Service: Urology;  Laterality: Right;    FAMILY HISTORY :   Family History  Problem Relation Age of Onset   Breast cancer Mother 55       deceased 46   Breast cancer Maternal Aunt 53       deceased 85s   Prostate cancer Father        deceased 2   Colon cancer Paternal Aunt 80       deceased 19s    SOCIAL HISTORY:   Social History   Tobacco Use   Smoking status: Never   Smokeless tobacco:  Never  Vaping Use   Vaping Use: Never used  Substance Use Topics   Alcohol use: No    Alcohol/week: 0.0 standard drinks of alcohol   Drug use: No    ALLERGIES:  is allergic to sulfa antibiotics.  MEDICATIONS:  Current Outpatient Medications  Medication Sig Dispense Refill   apixaban (ELIQUIS) 5 MG TABS tablet Take 1 tablet (5 mg total) by mouth 2 (two) times daily. 60 tablet 0   diltiazem (CARDIZEM CD) 180 MG 24 hr capsule Take 1 capsule (180 mg total) by mouth daily. 30 capsule 0   furosemide (LASIX) 40 MG tablet Take 40 mg by mouth daily.     labetalol (NORMODYNE) 200 MG tablet Take 200 mg by mouth 2 (two) times daily.      levothyroxine (SYNTHROID) 88 MCG tablet Take 88 mcg by mouth daily before breakfast.      losartan-hydrochlorothiazide (HYZAAR) 100-25 MG tablet Take 1 tablet by mouth daily.     nystatin (MYCOSTATIN/NYSTOP) powder APPLY TO AFFECTED AREA TWICE A DAY 15 g 3   pantoprazole (PROTONIX) 40 MG tablet TAKE 1 TABLET BY MOUTH EVERY DAY 90 tablet 0  potassium chloride SA (KLOR-CON M20) 20 MEQ tablet TAKE 1 TABLET BY MOUTH EVERY DAY 90 tablet 2   traMADol (ULTRAM) 50 MG tablet 0.5 tablet to 1 tablet every 8 hours as needed for pain 30 tablet 0   Vitamin D, Ergocalciferol, (DRISDOL) 1.25 MG (50000 UNIT) CAPS capsule TAKE 1 CAPSULE BY MOUTH ONE TIME PER WEEK 12 capsule 3   No current facility-administered medications for this visit.    PHYSICAL EXAMINATION: ECOG PERFORMANCE STATUS: 1 - Symptomatic but completely ambulatory  BP (!) 103/58 (BP Location: Left Arm, Patient Position: Sitting, Cuff Size: Large)   Pulse (!) 57   Temp (!) 96.7 F (35.9 C) (Tympanic)   Resp 16   Ht _0  (1.651 m)   Wt 211 lb (95.7 kg)   SpO2 95%   BMI 35.11 kg/m   Filed Weights   07/08/21 1307  Weight: 211 lb (95.7 kg)     Physical Exam Constitutional:      Appearance: She is not ill-appearing.  HENT:     Ears:     Comments: Hearing impaired. No hearing aids.     Mouth/Throat:      Pharynx: No oropharyngeal exudate.  Cardiovascular:     Rate and Rhythm: Normal rate. Rhythm irregular.  Pulmonary:     Effort: Pulmonary effort is normal. No respiratory distress.     Breath sounds: Normal breath sounds. No wheezing.  Abdominal:     General: There is no distension.     Palpations: Abdomen is soft. There is no mass.     Tenderness: There is no abdominal tenderness. There is no guarding or rebound.  Musculoskeletal:        General: No tenderness.     Right lower leg: No edema.     Left lower leg: No edema.     Comments: wheelchair  Skin:    General: Skin is warm.     Coloration: Skin is not pale.     Findings: No rash.  Neurological:     Mental Status: She is alert and oriented to person, place, and time.  Psychiatric:        Mood and Affect: Mood and affect normal.     LABORATORY DATA:  I have reviewed the data as listed Lab Results  Component Value Date   WBC 4.9 07/08/2021   NEUTROABS 3.6 07/08/2021   HGB 10.6 (L) 07/08/2021   HCT 32.2 (L) 07/08/2021   MCV 94.2 07/08/2021   PLT 193 07/08/2021     Chemistry      Component Value Date/Time   NA 137 07/08/2021 1239   NA 137 07/12/2013 0841   K 3.5 07/08/2021 1239   K 4.7 07/12/2013 0841   CL 107 07/08/2021 1239   CL 107 07/12/2013 0841   CO2 22 07/08/2021 1239   CO2 20 (L) 07/12/2013 0841   BUN 19 07/08/2021 1239   BUN 18 07/12/2013 0841   CREATININE 0.84 07/08/2021 1239   CREATININE 0.64 05/22/2014 1111      Component Value Date/Time   CALCIUM 9.2 07/08/2021 1239   CALCIUM 9.2 07/12/2013 0841   ALKPHOS 81 07/08/2021 1239   ALKPHOS 95 05/22/2014 1111   AST 22 07/08/2021 1239   AST 29 05/22/2014 1111   ALT 14 07/08/2021 1239   ALT 17 05/22/2014 1111   BILITOT 0.6 07/08/2021 1239   BILITOT 0.8 05/22/2014 1111     Iron/TIBC/Ferritin/ %Sat    Component Value Date/Time   IRON 56 07/08/2021 1239  IRON 32 04/24/2014 1114   TIBC 386 07/08/2021 1239   TIBC 495 (H) 04/24/2014 1114    FERRITIN 27 07/08/2021 1239   FERRITIN 9 (L) 04/24/2014 1114   IRONPCTSAT 15 07/08/2021 1239   IRONPCTSAT 6.5 04/24/2014 1114     RADIOGRAPHIC STUDIES: I have personally reviewed the radiological images as listed and agreed with the findings in the report. No results found.   ASSESSMENT & PLAN:  No problem-specific Assessment & Plan notes found for this encounter.  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 s/p Lutathera + sandostatin x 4 infusions.  October 2022-PET scan shows no evidence of progression of disease or any evidence of new disease; liver metastases-slight decrease in size. Labs reviewed and acceptable for continuation of Sandostatin monthly. Chromogranin A pending at time of visit but clinically asymptomatic.  Tolerating well.   # Iron Deficiency- possible AV malformation. Ferritin decreased to 27 but normal. Iron sat 15% with normal TIBC. Hemoglobin is stable at 10.6. No microcytosis. She will receive venofer today. This will be second dose of venofer. Could benefit from improving iron stores but she remains asymptomatic and iron stores are stable. She cites difficulty with transportation, offered [facility] transport, patient will consider in the future. Plan for venofer at next month's appointment.   # Chronic mild diarrhea- ? Sec to carcinoid [not on xarmelo secondary insurance issues- -STABLE and unchanged.   # rectal bleeding- hemorrhoids in past/Dr. Vicente Males and Dr. Marius Ditch s/p banding 2019. None currently. Monitor in setting of IDA and anticoagulation.    # A.fib- 2023 March- on eliquis [Dr.Parashoes Cardiologist]. Stable.    DISPOSITION Venofer & Sandostatin today RTC in 1 month for labs (cbc, cmp, chromogranin), Dr. Rogue Bussing, +/- venofer & Sandostatin- la  No orders of the defined types were placed in this encounter.  All questions were answered. The patient knows to call the clinic with any problems, questions  or concerns.   Verlon Au, NP 07/08/2021

## 2021-07-10 LAB — CHROMOGRANIN A: Chromogranin A (ng/mL): 86.5 ng/mL (ref 0.0–101.8)

## 2021-07-17 ENCOUNTER — Inpatient Hospital Stay: Payer: Medicare Other

## 2021-07-17 ENCOUNTER — Other Ambulatory Visit: Payer: Self-pay

## 2021-07-17 ENCOUNTER — Telehealth: Payer: Self-pay

## 2021-07-17 ENCOUNTER — Inpatient Hospital Stay (HOSPITAL_BASED_OUTPATIENT_CLINIC_OR_DEPARTMENT_OTHER): Payer: Medicare Other | Admitting: Medical Oncology

## 2021-07-17 VITALS — BP 132/72 | HR 55 | Temp 98.2°F | Resp 16

## 2021-07-17 DIAGNOSIS — K625 Hemorrhage of anus and rectum: Secondary | ICD-10-CM

## 2021-07-17 DIAGNOSIS — C7B02 Secondary carcinoid tumors of liver: Secondary | ICD-10-CM

## 2021-07-17 DIAGNOSIS — C179 Malignant neoplasm of small intestine, unspecified: Secondary | ICD-10-CM | POA: Diagnosis not present

## 2021-07-17 DIAGNOSIS — C7A092 Malignant carcinoid tumor of the stomach: Secondary | ICD-10-CM | POA: Diagnosis not present

## 2021-07-17 LAB — CBC WITH DIFFERENTIAL/PLATELET
Abs Immature Granulocytes: 0.03 10*3/uL (ref 0.00–0.07)
Basophils Absolute: 0 10*3/uL (ref 0.0–0.1)
Basophils Relative: 1 %
Eosinophils Absolute: 0.3 10*3/uL (ref 0.0–0.5)
Eosinophils Relative: 5 %
HCT: 31.3 % — ABNORMAL LOW (ref 36.0–46.0)
Hemoglobin: 10.4 g/dL — ABNORMAL LOW (ref 12.0–15.0)
Immature Granulocytes: 1 %
Lymphocytes Relative: 8 %
Lymphs Abs: 0.5 10*3/uL — ABNORMAL LOW (ref 0.7–4.0)
MCH: 31.2 pg (ref 26.0–34.0)
MCHC: 33.2 g/dL (ref 30.0–36.0)
MCV: 94 fL (ref 80.0–100.0)
Monocytes Absolute: 0.7 10*3/uL (ref 0.1–1.0)
Monocytes Relative: 11 %
Neutro Abs: 4.3 10*3/uL (ref 1.7–7.7)
Neutrophils Relative %: 74 %
Platelets: 190 10*3/uL (ref 150–400)
RBC: 3.33 MIL/uL — ABNORMAL LOW (ref 3.87–5.11)
RDW: 14.1 % (ref 11.5–15.5)
WBC: 5.8 10*3/uL (ref 4.0–10.5)
nRBC: 0 % (ref 0.0–0.2)

## 2021-07-17 NOTE — Telephone Encounter (Signed)
CT scan has been scheduled and patient/family informed. Family to pick up contrast.

## 2021-07-17 NOTE — Progress Notes (Signed)
Pt reporting red blood in stool x1 week along with abdominal pain. Denies nausea/vomiting.

## 2021-07-17 NOTE — Telephone Encounter (Signed)
Per Dr. Grayland Ormond, he spoke to pts daughter last night. She is having rectal bleeding with just about every BM x1 week. Last night it wouldn't stop, wore a pad. By morning bleeding had stopped. Concerned due to being on a blood thinner. Please advise.

## 2021-07-17 NOTE — Telephone Encounter (Signed)
[place] notified. She would like appt with [facility] for the pt.

## 2021-07-17 NOTE — Progress Notes (Signed)
Symptom Management [place] at [facility] Telephone:(336) [phone] Fax:(336) [phone]  [name], [name] [name], [name] [name], [name] [name], [name]   Name of the patient: Emma [name]  [id number]  [birth year]   Date of visit: 07/17/21  Reason for Consult: Emma [name] is a 81 y.o. female who presents today with her granddaughter for:  Bloody stools: Patient with PMH significant for malignant carcinoid tumor of small intestine and IDA who is currently on Sandostatin/Lutathera infusion every 2 weeks reports that for the past week she has had bloody stools and some off and on abdominal pain. Blood stools described an maroon blood that fills the entire bowel at times and other times looks like coffee grounds. Feels abdominal cramps like she "was about to start [her] period". Decreased appetite. No fever, hematuria, constipation or diarrhea. Mild nausea. NO other bleeding or bruising. She has not tried anything for symptoms other than trying to stay hydrated.    Denies any neurologic complaints. Denies recent fevers or illnesses.  Reports good appetite and denies weight loss. Denies chest pain. Denies urinary complaints. Patient offers no further specific complaints today.    PAST MEDICAL HISTORY: Past Medical History:  Diagnosis Date   Aromatase inhibitor use    Atrial fibrillation ([place])    Breast cancer ([place]) 2014   left breast cancer/radiation   Breast cancer, left breast ([place]) 06/19/2014   CHF (congestive heart failure) (HCC)    recent sepsis   Chronic kidney disease    Dizziness    Dyspnea    recently while admitted   GERD (gastroesophageal reflux disease)    Hearing loss    History of hiatal hernia    History of kidney stones    HOH  (hard of hearing)    severe   Hypertension    Hypothyroidism    Leg DVT (deep venous thromboembolism), acute ([place]) 07/2015   left leg after fracture   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   Pain    chest pain while admitted   Personal history of radiation therapy 2015   left breast ca   Thyroid disease     PAST SURGICAL HISTORY:  Past Surgical History:  Procedure Laterality Date   ABDOMINAL HYSTERECTOMY     partial   BREAST LUMPECTOMY Left 01/13/2013   invasive mammary carcinoma, clear margins, LN negative   BREAST LUMPECTOMY WITH SENTINEL LYMPH NODE BIOPSY Left 2014   CHOLECYSTECTOMY     COLONOSCOPY WITH PROPOFOL N/A 09/17/2017   Procedure: COLONOSCOPY WITH PROPOFOL;  Surgeon: Jonathon Bellows, MD;  Location: [facility] ENDOSCOPY;  Service: Gastroenterology;  Laterality: N/A;   CYSTOSCOPY W/ URETERAL STENT PLACEMENT Right 01/07/2016   Procedure: CYSTOSCOPY WITH STENT REPLACEMENT;  Surgeon: Hollice Espy, MD;  Location: [facility] ORS;  Service: Urology;  Laterality: Right;   CYSTOSCOPY WITH RETROGRADE PYELOGRAM, URETEROSCOPY AND STENT PLACEMENT Right 12/19/2015   Procedure: CYSTOSCOPY WITH RETROGRADE PYELOGRAM, URETEROSCOPY AND STENT PLACEMENT;  Surgeon: Ardis Hughs, MD;  Location: [facility] ORS;  Service: Urology;  Laterality: Right;   URETEROSCOPY WITH HOLMIUM LASER LITHOTRIPSY Right 01/07/2016   Procedure: URETEROSCOPY WITH HOLMIUM LASER LITHOTRIPSY;  Surgeon: Hollice Espy, MD;  Location: [facility] ORS;  Service: Urology;  Laterality: Right;    HEMATOLOGY/ONCOLOGY HISTORY:  Oncology History Overview Note  # JUNE 2015-MESENTERIC MASS [~5-6cm] Presumed CARCINOID with mets to liver [AUG  2016-Octreoscan; Dr.Hurwitz; [facility]]; FEB 2016- START OCTREOTIDE LAR qM; Octreo scan- FEB 2017- STABLE; cont Octreotide'; OCT 5th OCTREO SCAN- STABLE; mesenteric mass present.   # FEB 2018- Ga PET- uptake in liver/ mesenteric mass; NOV 2019- increase sandostatin to 32m q monthly.   #S/p Lutathera x4 every 2 months; last  treatment mid September 2022 [Dr.edmunds]; OCT 2022- PET STABLE.   # Jan/FEB 2018- Liver MRI- "fat sparing" Bx- neg; no further wu recom  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] s/p Lumpec & RT; ER/PR > 90%; Her2 Neu-NEG; Arimidex; MAY 2016- Mammo-NEG; [until summer 2020]  # IDA s/p IV iron; last March 2014; Colo [Dr.Elliot; Jan 2016] colon angiotele- s/p Argon laser; April 2017- Ferrahem  # DVT [taken off eliquis DEC 2017]; NOV -DEC 2017 CHF/ UTI with sepsis- stone extracted [Dr.Brandon]  # Thyroid nodule- MNG s/p Bx [NOV 2016]/ right adnexal mass [stable since 2010]; hard of hearing  MOLECULAR TESTING- Not done  GEN[place]IC TESTING- [place] 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  MEDICATIONS:  Current Outpatient Medications  Medication Sig Dispense Refill   apixaban (ELIQUIS) 5 MG TABS tablet Take 1 tablet (5 mg total) by mouth 2 (two) times daily. 60 tablet 0   diltiazem (CARDIZEM CD) 180 MG 24 hr capsule Take 1 capsule (180 mg total) by mouth daily. 30 capsule 0   furosemide (LASIX) 40 MG tablet Take 40 mg by mouth daily.     labetalol (NORMODYNE) 200 MG tablet Take 200 mg by mouth 2 (two) times daily.      levothyroxine (SYNTHROID) 88 MCG tablet Take 88 mcg by mouth daily before breakfast.      losartan-hydrochlorothiazide (HYZAAR) 100-25 MG tablet Take 1 tablet by mouth daily.     nystatin (MYCOSTATIN/NYSTOP) powder APPLY TO AFFECTED AREA TWICE A DAY 15 g 3   pantoprazole (PROTONIX) 40 MG tablet TAKE 1 TABLET BY MOUTH EVERY DAY 90 tablet 0   potassium chloride  SA (KLOR-CON M20) 20 MEQ tablet TAKE 1 TABLET BY MOUTH EVERY DAY 90 tablet 2   traMADol (ULTRAM) 50 MG tablet 0.5 tablet to 1 tablet every 8 hours as needed for pain 30 tablet 0   Vitamin D, Ergocalciferol, (DRISDOL) 1.25 MG (50000 UNIT) CAPS capsule TAKE 1 CAPSULE BY MOUTH ONE TIME PER WEEK 12 capsule 3   No current facility-administered medications for this visit.    VITAL SIGNS: BP 132/72   Pulse (!) 55   Temp 98.2 F (36.8 C) (Tympanic)   Resp 16   SpO2 96%  There were no vitals filed for this visit.  Estimated body mass index is 35.11 kg/m as calculated from the following:   Height as of 07/08/21: 5' 5"  (1.651 m).   Weight as of 07/08/21: 211 lb (95.7 kg).  LABS: CBC:    Component Value Date/Time   WBC 4.9 07/08/2021 1239   HGB 10.6 (L) 07/08/2021 1239   HGB 11.7 (L) 05/22/2014 1111   HCT 32.2 (L) 07/08/2021 1239   HCT 36.4 05/22/2014 1111   PLT 193 07/08/2021 1239   PLT 173 05/22/2014 1111  MCV 94.2 07/08/2021 1239   MCV 78 (L) 05/22/2014 1111   NEUTROABS 3.6 07/08/2021 1239   NEUTROABS 4.1 05/22/2014 1111   LYMPHSABS 0.4 (L) 07/08/2021 1239   LYMPHSABS 0.6 (L) 05/22/2014 1111   MONOABS 0.6 07/08/2021 1239   MONOABS 0.6 05/22/2014 1111   EOSABS 0.3 07/08/2021 1239   EOSABS 0.3 05/22/2014 1111   BASOSABS 0.0 07/08/2021 1239   BASOSABS 0.1 05/22/2014 1111   Comprehensive Metabolic Panel:    Component Value Date/Time   NA 137 07/08/2021 1239   NA 137 07/12/2013 0841   K 3.5 07/08/2021 1239   K 4.7 07/12/2013 0841   CL 107 07/08/2021 1239   CL 107 07/12/2013 0841   CO2 22 07/08/2021 1239   CO2 20 (L) 07/12/2013 0841   BUN 19 07/08/2021 1239   BUN 18 07/12/2013 0841   CREATININE 0.84 07/08/2021 1239   CREATININE 0.64 05/22/2014 1111   GLUCOSE 141 (H) 07/08/2021 1239   GLUCOSE 122 (H) 07/12/2013 0841   CALCIUM 9.2 07/08/2021 1239   CALCIUM 9.2 07/12/2013 0841   AST 22 07/08/2021 1239   AST 29 05/22/2014 1111   ALT 14 07/08/2021 1239   ALT 17 05/22/2014  1111   ALKPHOS 81 07/08/2021 1239   ALKPHOS 95 05/22/2014 1111   BILITOT 0.6 07/08/2021 1239   BILITOT 0.8 05/22/2014 1111   PROT 6.9 07/08/2021 1239   PROT 7.0 05/22/2014 1111   ALBUMIN 3.7 07/08/2021 1239   ALBUMIN 4.2 05/22/2014 1111    RADIOGRAPHIC STUDIES: No results found.  PERFORMANCE STATUS (ECOG) : 2 - Symptomatic, <50% confined to bed  Review of Systems Unless otherwise noted, a complete review of systems is negative.  Physical Exam General: NAD, no jaundice Cardiovascular: regular rate and rhythm Pulmonary: clear ant fields Abdomen: soft, mild tenderness of abdomen throughout + bowel sounds GU: no suprapubic tenderness Extremities: no edema, no joint deformities Skin: no rashes Neurological: Weakness but otherwise nonfocal  Assessment and Plan- Patient is a 81 y.o. female    Encounter Diagnoses  Name Primary?   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 Concerning for GI bleed vs other. Given this and her history imaging is warranted. She will also need to contact her GI specialist ASAP. Hemodynamically stable today. She will stay hydrated. Discussed red flag signs and symptoms that would indicate that she needs to go to the ER. Next steps will be determined following CT scan.    Patient expressed understanding and was in agreement with this plan. She also understands that She can call clinic at any time with any questions, concerns, or complaints.   Thank you for allowing me to participate in the care of this very pleasant patient.   Time Total: 25  Visit consisted of counseling and education dealing with the complex and emotionally intense issues of symptom management in the setting of serious illness.Greater than 50%  of this time was spent counseling and coordinating care related to the above assessment and plan.  Signed by: Nelwyn Salisbury, PA-C

## 2021-07-18 ENCOUNTER — Ambulatory Visit
Admission: RE | Admit: 2021-07-18 | Discharge: 2021-07-18 | Disposition: A | Discharge status: Home / Self Care | Payer: Medicare Other | Source: Ambulatory Visit | Attending: Medical Oncology | Admitting: Medical Oncology

## 2021-07-18 ENCOUNTER — Other Ambulatory Visit: Payer: Self-pay | Admitting: Medical Oncology

## 2021-07-18 DIAGNOSIS — C7B02 Secondary carcinoid tumors of liver: Secondary | ICD-10-CM

## 2021-07-18 DIAGNOSIS — C179 Malignant neoplasm of small intestine, unspecified: Secondary | ICD-10-CM | POA: Diagnosis present

## 2021-07-18 DIAGNOSIS — K625 Hemorrhage of anus and rectum: Secondary | ICD-10-CM

## 2021-07-18 MED ORDER — IOHEXOL 350 MG/ML SOLN
100.0000 mL | Freq: Once | INTRAVENOUS | Status: AC | PRN
  Administered 2021-07-18: 100 mL via INTRAVENOUS

## 2021-08-08 ENCOUNTER — Encounter: Payer: Self-pay | Admitting: Internal Medicine

## 2021-08-08 ENCOUNTER — Inpatient Hospital Stay: Payer: Medicare Other | Attending: Internal Medicine

## 2021-08-08 ENCOUNTER — Inpatient Hospital Stay: Payer: Medicare Other

## 2021-08-08 ENCOUNTER — Inpatient Hospital Stay (HOSPITAL_BASED_OUTPATIENT_CLINIC_OR_DEPARTMENT_OTHER): Payer: Medicare Other | Admitting: Internal Medicine

## 2021-08-08 DIAGNOSIS — C7A092 Malignant carcinoid tumor of the stomach: Secondary | ICD-10-CM | POA: Diagnosis not present

## 2021-08-08 DIAGNOSIS — D509 Iron deficiency anemia, unspecified: Secondary | ICD-10-CM | POA: Insufficient documentation

## 2021-08-08 DIAGNOSIS — K6389 Other specified diseases of intestine: Secondary | ICD-10-CM

## 2021-08-08 DIAGNOSIS — C7B02 Secondary carcinoid tumors of liver: Secondary | ICD-10-CM | POA: Insufficient documentation

## 2021-08-08 DIAGNOSIS — C7A019 Malignant carcinoid tumor of the small intestine, unspecified portion: Secondary | ICD-10-CM

## 2021-08-08 DIAGNOSIS — E340 Carcinoid syndrome: Secondary | ICD-10-CM | POA: Insufficient documentation

## 2021-08-08 LAB — COMPREHENSIVE METABOLIC PANEL
ALT: 15 U/L (ref 0–44)
AST: 24 U/L (ref 15–41)
Albumin: 3.6 g/dL (ref 3.5–5.0)
Alkaline Phosphatase: 70 U/L (ref 38–126)
Anion gap: 8 (ref 5–15)
BUN: 17 mg/dL (ref 8–23)
CO2: 21 mmol/L — ABNORMAL LOW (ref 22–32)
Calcium: 9.1 mg/dL (ref 8.9–10.3)
Chloride: 109 mmol/L (ref 98–111)
Creatinine, Ser: 0.88 mg/dL (ref 0.44–1.00)
GFR, Estimated: >60 mL/min (ref >60)
Glucose, Bld: 127 mg/dL — ABNORMAL HIGH (ref 70–99)
Potassium: 3.5 mmol/L (ref 3.5–5.1)
Sodium: 138 mmol/L (ref 135–145)
Total Bilirubin: 0.6 mg/dL (ref 0.3–1.2)
Total Protein: 6.6 g/dL (ref 6.5–8.1)

## 2021-08-08 LAB — CBC WITH DIFFERENTIAL/PLATELET
Abs Immature Granulocytes: 0.02 10*3/uL (ref 0.00–0.07)
Basophils Absolute: 0 10*3/uL (ref 0.0–0.1)
Basophils Relative: 1 %
Eosinophils Absolute: 0.4 10*3/uL (ref 0.0–0.5)
Eosinophils Relative: 8 %
HCT: 31.5 % — ABNORMAL LOW (ref 36.0–46.0)
Hemoglobin: 10.4 g/dL — ABNORMAL LOW (ref 12.0–15.0)
Immature Granulocytes: 0 %
Lymphocytes Relative: 9 %
Lymphs Abs: 0.5 10*3/uL — ABNORMAL LOW (ref 0.7–4.0)
MCH: 31.2 pg (ref 26.0–34.0)
MCHC: 33 g/dL (ref 30.0–36.0)
MCV: 94.6 fL (ref 80.0–100.0)
Monocytes Absolute: 0.7 10*3/uL (ref 0.1–1.0)
Monocytes Relative: 14 %
Neutro Abs: 3.6 10*3/uL (ref 1.7–7.7)
Neutrophils Relative %: 68 %
Platelets: 193 10*3/uL (ref 150–400)
RBC: 3.33 MIL/uL — ABNORMAL LOW (ref 3.87–5.11)
RDW: 14.8 % (ref 11.5–15.5)
WBC: 5.3 10*3/uL (ref 4.0–10.5)
nRBC: 0 % (ref 0.0–0.2)

## 2021-08-08 MED ORDER — OCTREOTIDE ACETATE 30 MG IM KIT
30.0000 mg | PACK | Freq: Once | INTRAMUSCULAR | Status: AC
  Administered 2021-08-08: 30 mg via INTRAMUSCULAR
  Filled 2021-08-08: qty 1

## 2021-08-08 NOTE — Progress Notes (Signed)
[place] OFFICE PROGRESS NOTE  [name], [name] [name], [name] [name], [name] [name], [name] [name], [name]   Cancer Staging  No matching staging information was found for the patient.   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   # FEB 2018- Ga PET- uptake in liver/ mesenteric mass; NOV 2019- increase sandostatin to 70m q monthly.   #S/p Lutathera x4 every 2 months; last treatment mid September 2022 [Dr.edmunds]; OCT 2022- PET STABLE.   # Jan/FEB 2018- Liver MRI- "fat sparing" Bx- neg; no further wu recom  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] s/p Lumpec & RT; ER/PR > 90%; Her2 Neu-NEG; Arimidex; MAY 2016- Mammo-NEG; [until summer 2020]  # IDA s/p IV iron; last March 2014; Colo [Dr.Elliot; Jan 2016] colon angiotele- s/p Argon laser; April 2017- Ferrahem  # DVT [taken off eliquis DEC 2017]; NOV -DEC 2017 CHF/ UTI with sepsis- stone extracted [Dr.Brandon]  # Thyroid nodule- MNG s/p Bx [NOV 2016]/ right adnexal mass [stable since 2010]; hard of hearing  MOLECULAR TESTING- Not done  GENETIC TESTING- [place] ? 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  In a wheelchair.  Alone.   [name].  female pleasant patient above history of metastatic carcinoid tumor currently on Sandostatin/; Lutathera infusions every 2 months ; iron deficient anemia question AV malformation; A-fib on Eliquis is here for follow-up.  [facility]  Patient also had a CT scan abdomen pelvis to rule out other process.  Hemoglobin overall stable at 10.  Patient recommended follow-up in the GI clinic-[facility] patient did not make an appointment.  Patient reports chronic diarrhea.  Blood in stool once a day with small blood in bowel movement this morning.  Occasional abdominal pain/soreness.  Review of Systems  Constitutional:  Positive for malaise/fatigue. Negative for chills, diaphoresis, fever and weight loss.  HENT:  Negative for nosebleeds and sore throat.   Eyes:  Negative for double vision.  Respiratory:  Negative for hemoptysis, sputum production, shortness of breath and wheezing.   Cardiovascular:  Negative for chest pain, palpitations and orthopnea.  Gastrointestinal:  Positive for abdominal pain and diarrhea. Negative for constipation, heartburn, melena, nausea and vomiting.  Genitourinary:  Negative for dysuria, frequency and urgency.  Musculoskeletal:  Positive for back pain. Negative for joint pain.  Skin: Negative.  Negative for itching and rash.  Neurological:  Negative for dizziness, tingling, focal weakness, weakness and headaches.  Endo/Heme/Allergies:  Does not bruise/bleed easily.  Psychiatric/Behavioral:  Negative for depression. The patient is not nervous/anxious and does not have insomnia.       PAST MEDICAL HISTORY :  Past Medical History:  Diagnosis Date   Aromatase inhibitor use    Atrial fibrillation ([place])    Breast cancer ([place]) 2014   left breast  cancer/radiation   Breast cancer, left breast ([place]) 06/19/2014   CHF (congestive heart failure) (HCC)    recent sepsis   Chronic kidney disease    Dizziness    Dyspnea    recently while admitted   GERD (gastroesophageal reflux disease)    Hearing loss    History of hiatal hernia    History of kidney stones    HOH (hard of hearing)    severe   Hypertension    Hypothyroidism    Leg DVT (deep venous thromboembolism), acute ([place]) 07/2015   left leg after fracture   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   Pain    chest pain while admitted   Personal history of radiation therapy 2015   left breast ca   Thyroid disease     PAST SURGICAL HISTORY :   Past Surgical History:  Procedure Laterality Date   ABDOMINAL HYSTERECTOMY     partial   BREAST LUMPECTOMY Left 01/13/2013   invasive mammary carcinoma, clear margins, LN negative   BREAST LUMPECTOMY WITH SENTINEL LYMPH NODE BIOPSY Left 2014   CHOLECYSTECTOMY     COLONOSCOPY WITH PROPOFOL N/A 09/17/2017   Procedure: COLONOSCOPY WITH PROPOFOL;  Surgeon: Jonathon Bellows, MD;  Location[facility] ENDOSCOPY;  Service: Gastroenterology;  Laterality: N/A;   CYSTOSCOPY W/ URETERAL STENT PLACEMENT Right 01/07/2016   Procedure: CYSTOSCOPY WITH STENT REPLACEMENT;  Surgeon: Hollice Espy, MD;  Location: [facility] ORS;  Service: Urology;  Laterality: Right;   CYSTOSCOPY WITH RETROGRADE PYELOGRAM, URETEROSCOPY AND STENT PLACEMENT Right 12/19/2015   Procedure: CYSTOSCOPY WITH RETROGRADE PYELOGRAM, URETEROSCOPY AND STENT PLACEMENT;  Surgeon: Ardis Hughs, MD;  Location: [facility] ORS;  Service: Urology;  Laterality: Right;   URETEROSCOPY WITH HOLMIUM LASER LITHOTRIPSY Right 01/07/2016   Procedure: URETEROSCOPY WITH HOLMIUM LASER LITHOTRIPSY;  Surgeon: Hollice Espy, MD;  Location: [facility] ORS;  Service: Urology;  Laterality: Right;    FAMILY HISTORY :   Family History  Problem Relation Age of Onset   Breast cancer Mother 11       deceased 52   Breast cancer Maternal  Aunt 12       deceased 39s   Prostate cancer Father        deceased 31   Colon cancer Paternal Aunt 80       deceased 10s    SOCIAL HISTORY:   Social History   Tobacco Use   Smoking status: Never   Smokeless tobacco: Never  Vaping Use   Vaping Use: Never used  Substance Use Topics   Alcohol use: No    Alcohol/week: 0.0 standard drinks of alcohol   Drug use: No    ALLERGIES:  is allergic to sulfa antibiotics.  MEDICATIONS:  Current Outpatient Medications  Medication Sig Dispense Refill   apixaban (ELIQUIS) 5 MG TABS tablet Take 1 tablet (5 mg total) by mouth 2 (two) times daily. 60 tablet 0   diltiazem (CARDIZEM CD) 180 MG 24 hr capsule Take 1 capsule (180 mg total) by mouth daily. 30 capsule 0   furosemide (LASIX) 40 MG tablet Take 40 mg by mouth daily.     labetalol (NORMODYNE) 200 MG tablet Take 200 mg by mouth 2 (two) times daily.      levothyroxine (SYNTHROID) 88 MCG tablet Take 88 mcg by mouth daily before breakfast.      losartan-hydrochlorothiazide (HYZAAR)  100-25 MG tablet Take 1 tablet by mouth daily.     nystatin (MYCOSTATIN/NYSTOP) powder APPLY TO AFFECTED AREA TWICE A DAY 15 g 3   pantoprazole (PROTONIX) 40 MG tablet TAKE 1 TABLET BY MOUTH EVERY DAY 90 tablet 0   potassium chloride SA (KLOR-CON M20) 20 MEQ tablet TAKE 1 TABLET BY MOUTH EVERY DAY 90 tablet 2   traMADol (ULTRAM) 50 MG tablet 0.5 tablet to 1 tablet every 8 hours as needed for pain 30 tablet 0   Vitamin D, Ergocalciferol, (DRISDOL) 1.25 MG (50000 UNIT) CAPS capsule TAKE 1 CAPSULE BY MOUTH ONE TIME PER WEEK 12 capsule 3   No current facility-administered medications for this visit.    PHYSICAL EXAMINATION: ECOG PERFORMANCE STATUS: 1 - Symptomatic but completely ambulatory  BP (!) 146/67 (BP Location: Left Arm, Patient Position: Sitting)   Pulse (!) 54   Temp 98.3 F (36.8 C) (Tympanic)   Resp 18   Wt 214 lb 11.2 oz (97.4 kg)   BMI 35.73 kg/m   Filed Weights   08/08/21 1000  Weight: 214  lb 11.2 oz (97.4 kg)     Physical Exam HENT:     Head: Normocephalic and atraumatic.     Mouth/Throat:     Pharynx: No oropharyngeal exudate.  Eyes:     Pupils: Pupils are equal, round, and reactive to light.  Cardiovascular:     Rate and Rhythm: Normal rate. Rhythm irregular.  Pulmonary:     Effort: Pulmonary effort is normal. No respiratory distress.     Breath sounds: Normal breath sounds. No wheezing.  Abdominal:     General: Bowel sounds are normal. There is no distension.     Palpations: Abdomen is soft. There is no mass.     Tenderness: There is no abdominal tenderness. There is no guarding or rebound.  Musculoskeletal:        General: Swelling present. No tenderness. Normal range of motion.     Cervical back: Normal range of motion and neck supple.  Skin:    General: Skin is warm.  Neurological:     Mental Status: She is alert and oriented to person, place, and time.  Psychiatric:        Mood and Affect: Affect normal.     LABORATORY DATA:  I have reviewed the data as listed    Component Value Date/Time   NA 138 08/08/2021 1022   NA 137 07/12/2013 0841   K 3.5 08/08/2021 1022   K 4.7 07/12/2013 0841   CL 109 08/08/2021 1022   CL 107 07/12/2013 0841   CO2 21 (L) 08/08/2021 1022   CO2 20 (L) 07/12/2013 0841   GLUCOSE 127 (H) 08/08/2021 1022   GLUCOSE 122 (H) 07/12/2013 0841   BUN 17 08/08/2021 1022   BUN 18 07/12/2013 0841   CREATININE 0.88 08/08/2021 1022   CREATININE 0.64 05/22/2014 1111   CALCIUM 9.1 08/08/2021 1022   CALCIUM 9.2 07/12/2013 0841   PROT 6.6 08/08/2021 1022   PROT 7.0 05/22/2014 1111   ALBUMIN 3.6 08/08/2021 1022   ALBUMIN 4.2 05/22/2014 1111   AST 24 08/08/2021 1022   AST 29 05/22/2014 1111   ALT 15 08/08/2021 1022   ALT 17 05/22/2014 1111   ALKPHOS 70 08/08/2021 1022   ALKPHOS 95 05/22/2014 1111   BILITOT 0.6 08/08/2021 1022   BILITOT 0.8 05/22/2014 1111   GFRNONAA >60 08/08/2021 1022   GFRNONAA >60 05/22/2014 1111   GFRAA  >60 10/07/2019 1319  GFRAA >60 05/22/2014 1111    No results found for: "SPEP", "UPEP"  Lab Results  Component Value Date   WBC 5.3 08/08/2021   NEUTROABS 3.6 08/08/2021   HGB 10.4 (L) 08/08/2021   HCT 31.5 (L) 08/08/2021   MCV 94.6 08/08/2021   PLT 193 08/08/2021      Chemistry      Component Value Date/Time   NA 138 08/08/2021 1022   NA 137 07/12/2013 0841   K 3.5 08/08/2021 1022   K 4.7 07/12/2013 0841   CL 109 08/08/2021 1022   CL 107 07/12/2013 0841   CO2 21 (L) 08/08/2021 1022   CO2 20 (L) 07/12/2013 0841   BUN 17 08/08/2021 1022   BUN 18 07/12/2013 0841   CREATININE 0.88 08/08/2021 1022   CREATININE 0.64 05/22/2014 1111      Component Value Date/Time   CALCIUM 9.1 08/08/2021 1022   CALCIUM 9.2 07/12/2013 0841   ALKPHOS 70 08/08/2021 1022   ALKPHOS 95 05/22/2014 1111   AST 24 08/08/2021 1022   AST 29 05/22/2014 1111   ALT 15 08/08/2021 1022   ALT 17 05/22/2014 1111   BILITOT 0.6 08/08/2021 1022   BILITOT 0.8 05/22/2014 1111       RADIOGRAPHIC STUDIES: I have personally reviewed the radiological images as listed and agreed with the findings in the report. No results found.   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  October 2022-PET scan shows no evidence of progression of disease or any evidence of new disease; liver metastases-slight decrease in size.JUNE 2023Clide Deutscher bleeding-] Stable partially calcified mesenteric mass in the root of the small bowel mesentery consistent with known carcinoid tumor;  No findings for recurrent hepatic metastatic disease. No new sites of metastatic disease; No acute abdominal/pelvic findings.  #Continue Sandostatin q M.  No obvious progression of disease noticed.  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  Given the ongoing intermittent rectal bleeding-discussed regarding evaluation with GI at [facility].  Discussed with Octavia Bruckner, PA/-kindly agrees to evaluate the patient sooner.  # chronic mild diarrhea- ? 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  ## A.fib 2023; March- on eliquis [Dr.Parashoes Cardiologist];  Monitor closely given history of rectal bleeding See above.  # DISPOSITION- #  Ok with Sandostatin today; # 1 week venofer   # Follow up in 1 months NP- cbc/cmp/chromogranin;-Sandstatin; possible venofer-   # Follow up in 2  months MD cbc/cmp/chromogranin; iron studies;ferritin-- -Sandstatin; possible venofer- Dr.B    Orders Placed This Encounter  Procedures   CBC with Differential/Platelet    Standing Status:   Future    Standing Expiration Date:   08/09/2022   Comprehensive metabolic panel    Standing Status:   Future    Standing Expiration Date:   08/09/2022   Chromogranin A    Standing Status:   Future    Standing Expiration Date:   08/09/2022   CBC with Differential/Platelet    Standing Status:   Future    Standing Expiration Date:   08/09/2022   Comprehensive metabolic panel    Standing Status:   Future    Standing Expiration Date:   08/09/2022   Chromogranin A    Standing Status:   Future  Standing Expiration Date:   08/09/2022   Iron and TIBC    Standing Status:   Future    Standing Expiration Date:   08/09/2022   Ferritin    Standing Status:   Future    Standing Expiration Date:   08/09/2022    All questions were answered. The patient knows to call the clinic with any problems, questions or concerns.      [name], MD 08/08/2021 3:07 PM

## 2021-08-08 NOTE — Assessment & Plan Note (Addendum)
#   Non- functional Small bowel/mesenteric carcinoid; s/p Lutathera + sandostatin x4 infusions.  October 2022-PET scan shows no evidence of progression of disease or any evidence of new disease; liver metastases-slight decrease in size.JUNE 2023Clide Deutscher bleeding-] Stable partially calcified mesenteric mass in the root of the small bowel mesentery consistent with known carcinoid tumor;  No findings for recurrent hepatic metastatic disease. No new sites of metastatic disease; No acute abdominal/pelvic findings.  #Continue Sandostatin q M.  No obvious progression of disease noticed.  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  Given the ongoing intermittent rectal bleeding-discussed regarding evaluation with GI at [facility].  Discussed with Octavia Bruckner, PA/-kindly agrees to evaluate the patient sooner.  # chronic mild diarrhea- ? 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  ## A.fib 2023; March- on eliquis [Dr.Parashoes Cardiologist];  Monitor closely given history of rectal bleeding See above.  # I reviewed the blood work- with the patient in detail; also reviewed the imaging independently [as summarized above]; and with the patient in detail.    # DISPOSITION- #  Ok with Sandostatin today; # 1 week venofer   # Follow up in 1 months NP- cbc/cmp/chromogranin;-Sandstatin; possible venofer-   # Follow up in 2  months MD cbc/cmp/chromogranin; iron studies;ferritin-- -Sandstatin; possible venofer- Dr.B

## 2021-08-08 NOTE — Progress Notes (Signed)
Patient reports chronic diarrhea.  Blood in stool once a day with small blood in bowel movement this morning.  Occasional abdominal pain/soreness.

## 2021-08-12 LAB — CHROMOGRANIN A: Chromogranin A (ng/mL): 91.5 ng/mL (ref 0.0–101.8)

## 2021-08-14 MED FILL — Iron Sucrose Inj 20 MG/ML (Fe Equiv): INTRAVENOUS | Qty: 10 | Status: AC

## 2021-08-15 ENCOUNTER — Inpatient Hospital Stay: Payer: Medicare Other

## 2021-08-15 VITALS — BP 115/44 | HR 50 | Temp 97.0°F

## 2021-08-15 DIAGNOSIS — C7A092 Malignant carcinoid tumor of the stomach: Secondary | ICD-10-CM | POA: Diagnosis not present

## 2021-08-15 DIAGNOSIS — K6389 Other specified diseases of intestine: Secondary | ICD-10-CM

## 2021-08-15 MED ORDER — SODIUM CHLORIDE 0.9 % IV SOLN
200.0000 mg | Freq: Once | INTRAVENOUS | Status: AC
  Administered 2021-08-15: 200 mg via INTRAVENOUS
  Filled 2021-08-15: qty 200

## 2021-08-15 MED ORDER — SODIUM CHLORIDE 0.9 % IV SOLN
Freq: Once | INTRAVENOUS | Status: AC
  Filled 2021-08-15: qty 250

## 2021-08-15 NOTE — Patient Instructions (Signed)

## 2021-09-06 ENCOUNTER — Inpatient Hospital Stay: Payer: Medicare Other

## 2021-09-06 ENCOUNTER — Inpatient Hospital Stay (HOSPITAL_BASED_OUTPATIENT_CLINIC_OR_DEPARTMENT_OTHER): Payer: Medicare Other | Admitting: Nurse Practitioner

## 2021-09-06 ENCOUNTER — Inpatient Hospital Stay: Payer: Medicare Other | Attending: Internal Medicine

## 2021-09-06 VITALS — BP 125/46 | HR 52 | Temp 97.9°F | Resp 16

## 2021-09-06 VITALS — BP 117/55 | HR 54 | Temp 97.9°F | Resp 16 | Wt 209.0 lb

## 2021-09-06 DIAGNOSIS — C7B02 Secondary carcinoid tumors of liver: Secondary | ICD-10-CM | POA: Insufficient documentation

## 2021-09-06 DIAGNOSIS — E340 Carcinoid syndrome: Secondary | ICD-10-CM | POA: Diagnosis not present

## 2021-09-06 DIAGNOSIS — E041 Nontoxic single thyroid nodule: Secondary | ICD-10-CM | POA: Insufficient documentation

## 2021-09-06 DIAGNOSIS — D500 Iron deficiency anemia secondary to blood loss (chronic): Secondary | ICD-10-CM

## 2021-09-06 DIAGNOSIS — C7A092 Malignant carcinoid tumor of the stomach: Secondary | ICD-10-CM | POA: Diagnosis present

## 2021-09-06 DIAGNOSIS — D509 Iron deficiency anemia, unspecified: Secondary | ICD-10-CM | POA: Insufficient documentation

## 2021-09-06 DIAGNOSIS — K6389 Other specified diseases of intestine: Secondary | ICD-10-CM

## 2021-09-06 LAB — CBC WITH DIFFERENTIAL/PLATELET
Abs Immature Granulocytes: 0.01 10*3/uL (ref 0.00–0.07)
Basophils Absolute: 0 10*3/uL (ref 0.0–0.1)
Basophils Relative: 1 %
Eosinophils Absolute: 0.3 10*3/uL (ref 0.0–0.5)
Eosinophils Relative: 7 %
HCT: 32.1 % — ABNORMAL LOW (ref 36.0–46.0)
Hemoglobin: 10.4 g/dL — ABNORMAL LOW (ref 12.0–15.0)
Immature Granulocytes: 0 %
Lymphocytes Relative: 8 %
Lymphs Abs: 0.4 10*3/uL — ABNORMAL LOW (ref 0.7–4.0)
MCH: 30.9 pg (ref 26.0–34.0)
MCHC: 32.4 g/dL (ref 30.0–36.0)
MCV: 95.3 fL (ref 80.0–100.0)
Monocytes Absolute: 0.7 10*3/uL (ref 0.1–1.0)
Monocytes Relative: 14 %
Neutro Abs: 3.7 10*3/uL (ref 1.7–7.7)
Neutrophils Relative %: 70 %
Platelets: 180 10*3/uL (ref 150–400)
RBC: 3.37 MIL/uL — ABNORMAL LOW (ref 3.87–5.11)
RDW: 15.2 % (ref 11.5–15.5)
WBC: 5.2 10*3/uL (ref 4.0–10.5)
nRBC: 0 % (ref 0.0–0.2)

## 2021-09-06 LAB — COMPREHENSIVE METABOLIC PANEL
ALT: 15 U/L (ref 0–44)
AST: 22 U/L (ref 15–41)
Albumin: 3.7 g/dL (ref 3.5–5.0)
Alkaline Phosphatase: 78 U/L (ref 38–126)
Anion gap: 11 (ref 5–15)
BUN: 18 mg/dL (ref 8–23)
CO2: 25 mmol/L (ref 22–32)
Calcium: 9.4 mg/dL (ref 8.9–10.3)
Chloride: 103 mmol/L (ref 98–111)
Creatinine, Ser: 0.91 mg/dL (ref 0.44–1.00)
GFR, Estimated: >60 mL/min (ref >60)
Glucose, Bld: 121 mg/dL — ABNORMAL HIGH (ref 70–99)
Potassium: 3.6 mmol/L (ref 3.5–5.1)
Sodium: 139 mmol/L (ref 135–145)
Total Bilirubin: 0.6 mg/dL (ref 0.3–1.2)
Total Protein: 6.7 g/dL (ref 6.5–8.1)

## 2021-09-06 MED ORDER — OCTREOTIDE ACETATE 30 MG IM KIT
PACK | INTRAMUSCULAR | Status: AC
  Filled 2021-09-06: qty 1

## 2021-09-06 MED ORDER — SODIUM CHLORIDE 0.9 % IV SOLN
Freq: Once | INTRAVENOUS | Status: AC
  Filled 2021-09-06: qty 250

## 2021-09-06 MED ORDER — OCTREOTIDE ACETATE 30 MG IM KIT
30.0000 mg | PACK | Freq: Once | INTRAMUSCULAR | Status: AC
  Administered 2021-09-06: 30 mg via INTRAMUSCULAR

## 2021-09-06 MED ORDER — SODIUM CHLORIDE 0.9 % IV SOLN
200.0000 mg | Freq: Once | INTRAVENOUS | Status: AC
  Administered 2021-09-06: 200 mg via INTRAVENOUS
  Filled 2021-09-06: qty 200

## 2021-09-06 NOTE — Progress Notes (Unsigned)
[facility] OFFICE PROGRESS NOTE  [name], [name] [name], [name] [name], [name] [name], [name] [name], [name]   Cancer Staging  No matching staging information was found for the patient.   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   # FEB 2018- Ga PET- uptake in liver/ mesenteric mass; NOV 2019- increase sandostatin to 20m q monthly.   #S/p Lutathera x4 every 2 months; last treatment mid September 2022 [Dr.edmunds]; OCT 2022- PET STABLE.   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  In a wheelchair.  Alone.   [name].  female pleasant patient with above history of metastatic carcinoid tumor, currently on Sandostatin/Lutathera infusions every 2 months, iron deficient anemia, possible AV malformation, a-fib on eliquis, who returns to clinic for lab evaluation and follow up. Continues to have some diarrhea intermittently. No flushing, pain. Endorses some blood in the stool. Says this is ongoing. Denies new symptoms.   Review of Systems  Constitutional:  Negative for chills, fever, malaise/fatigue and weight loss.  HENT:  Positive for hearing loss (hard of hearing despite hearing aids). Negative for nosebleeds, sore throat and tinnitus.   Eyes:  Negative for blurred vision and double vision.  Respiratory:  Negative for cough, hemoptysis, shortness of breath and wheezing.   Cardiovascular:  Negative for chest pain, palpitations and leg swelling.  Gastrointestinal:  Positive for diarrhea. Negative for abdominal pain, blood in stool, constipation, melena, nausea and vomiting.  Genitourinary:  Negative for dysuria and urgency.  Musculoskeletal:  Negative for back pain, falls, joint pain and myalgias.  Skin:  Negative for itching and rash.  Neurological:  Negative for dizziness, tingling, sensory change, loss of consciousness, weakness and headaches.  Endo/Heme/Allergies:  Negative for environmental allergies. Does not bruise/bleed easily.  Psychiatric/Behavioral:  Negative for depression. The patient is not nervous/anxious and does not have insomnia.       PAST MEDICAL HISTORY :  Past Medical History:  Diagnosis Date   Aromatase inhibitor use    Atrial fibrillation ([place]    Breast cancer ([place] 2014  left breast cancer/radiation   Breast cancer, left breast ([place]) 06/19/2014   CHF (congestive heart failure) (HCC)    recent sepsis   Chronic kidney  disease    Dizziness    Dyspnea    recently while admitted   GERD (gastroesophageal reflux disease)    Hearing loss    History of hiatal hernia    History of kidney stones    HOH (hard of hearing)    severe   Hypertension    Hypothyroidism    Leg DVT (deep venous thromboembolism), acute ([place]) 07/2015   left leg after fracture   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   Pain    chest pain while admitted   Personal history of radiation therapy 2015   left breast ca   Thyroid disease     PAST SURGICAL HISTORY :   Past Surgical History:  Procedure Laterality Date   ABDOMINAL HYSTERECTOMY     partial   BREAST LUMPECTOMY Left 01/13/2013   invasive mammary carcinoma, clear margins, LN negative   BREAST LUMPECTOMY WITH SENTINEL LYMPH NODE BIOPSY Left 2014   CHOLECYSTECTOMY     COLONOSCOPY WITH PROPOFOL N/A 09/17/2017   Procedure: COLONOSCOPY WITH PROPOFOL;  Surgeon: Jonathon Bellows, MD;  Location: [facility] ENDOSCOPY;  Service: Gastroenterology;  Laterality: N/A;   CYSTOSCOPY W/ URETERAL STENT PLACEMENT Right 01/07/2016   Procedure: CYSTOSCOPY WITH STENT REPLACEMENT;  Surgeon: Hollice Espy, MD;  Location: [facility] ORS;  Service: Urology;  Laterality: Right;   CYSTOSCOPY WITH RETROGRADE PYELOGRAM, URETEROSCOPY AND STENT PLACEMENT Right 12/19/2015   Procedure: CYSTOSCOPY WITH RETROGRADE PYELOGRAM, URETEROSCOPY AND STENT PLACEMENT;  Surgeon: Ardis Hughs, MD;  Location: [facility] ORS;  Service: Urology;  Laterality: Right;   URETEROSCOPY WITH HOLMIUM LASER LITHOTRIPSY Right 01/07/2016   Procedure: URETEROSCOPY WITH HOLMIUM LASER LITHOTRIPSY;  Surgeon: Hollice Espy, MD;  Location: [facility] ORS;  Service: Urology;  Laterality: Right;    FAMILY HISTORY :   Family History  Problem Relation Age of Onset   Breast cancer Mother 24       deceased 10   Breast cancer Maternal Aunt 35       deceased 20s   Prostate cancer Father        deceased 49   Colon cancer Paternal Aunt 80       deceased 57s    SOCIAL  HISTORY:   Social History   Tobacco Use   Smoking status: Never   Smokeless tobacco: Never  Vaping Use   Vaping Use: Never used  Substance Use Topics   Alcohol use: No    Alcohol/week: 0.0 standard drinks of alcohol   Drug use: No    ALLERGIES:  is allergic to sulfa antibiotics.  MEDICATIONS:  Current Outpatient Medications  Medication Sig Dispense Refill   apixaban (ELIQUIS) 5 MG TABS tablet Take 1 tablet (5 mg total) by mouth 2 (two) times daily. 60 tablet 0   diltiazem (CARDIZEM CD) 180 MG 24 hr capsule Take 1 capsule (180 mg total) by mouth daily. 30 capsule 0   furosemide (LASIX) 40 MG tablet Take 40 mg by mouth daily.     labetalol (NORMODYNE) 200 MG tablet Take 200 mg by mouth 2 (two) times daily.      levothyroxine (SYNTHROID) 88 MCG tablet Take 88 mcg by mouth daily before breakfast.      losartan-hydrochlorothiazide (HYZAAR) 100-25 MG tablet Take 1 tablet by mouth daily.     nystatin (MYCOSTATIN/NYSTOP) powder APPLY TO AFFECTED AREA TWICE A DAY 15 g 3  pantoprazole (PROTONIX) 40 MG tablet TAKE 1 TABLET BY MOUTH EVERY DAY 90 tablet 0   potassium chloride SA (KLOR-CON M20) 20 MEQ tablet TAKE 1 TABLET BY MOUTH EVERY DAY 90 tablet 2   traMADol (ULTRAM) 50 MG tablet 0.5 tablet to 1 tablet every 8 hours as needed for pain 30 tablet 0   Vitamin D, Ergocalciferol, (DRISDOL) 1.25 MG (50000 UNIT) CAPS capsule TAKE 1 CAPSULE BY MOUTH ONE TIME PER WEEK 12 capsule 3   No current facility-administered medications for this visit.    PHYSICAL EXAMINATION: ECOG PERFORMANCE STATUS: 1 - Symptomatic but completely ambulatory  BP (!) 117/55   Pulse (!) 54   Temp 97.9 F (36.6 C) (Oral)   Resp 16   Wt 209 lb (94.8 kg)   SpO2 96%   BMI 34.78 kg/m   Filed Weights   09/06/21 1300  Weight: 209 lb (94.8 kg)    Physical Exam Constitutional:      Appearance: She is not ill-appearing.     Comments: wheelchair  HENT:     Ears:     Comments: Hard of hearing Eyes:     General:  No scleral icterus. Cardiovascular:     Rate and Rhythm: Normal rate and regular rhythm.  Abdominal:     General: There is no distension.     Palpations: Abdomen is soft.     Tenderness: There is no abdominal tenderness. There is no guarding.  Musculoskeletal:        General: No deformity.     Right lower leg: No edema.     Left lower leg: No edema.  Lymphadenopathy:     Cervical: No cervical adenopathy.  Skin:    General: Skin is warm and dry.  Neurological:     Mental Status: She is alert and oriented to person, place, and time. Mental status is at baseline.  Psychiatric:        Mood and Affect: Mood normal.        Behavior: Behavior normal.     LABORATORY DATA:  I have reviewed the data as listed No results found for: "SPEP", "UPEP"  Lab Results  Component Value Date   WBC 5.3 08/08/2021   NEUTROABS 3.6 08/08/2021   HGB 10.4 (L) 08/08/2021   HCT 31.5 (L) 08/08/2021   MCV 94.6 08/08/2021   PLT 193 08/08/2021      Latest Ref Rng & Units 08/08/2021   10:22 AM 07/08/2021   12:39 PM 06/07/2021   12:35 PM  CMP  Glucose 70 - 99 mg/dL 127  141  131   BUN 8 - 23 mg/dL 17  19  17    Creatinine 0.44 - 1.00 mg/dL 0.88  0.84  0.81   Sodium 135 - 145 mmol/L 138  137  137   Potassium 3.5 - 5.1 mmol/L 3.5  3.5  3.7   Chloride 98 - 111 mmol/L 109  107  105   CO2 22 - 32 mmol/L 21  22  22    Calcium 8.9 - 10.3 mg/dL 9.1  9.2  9.2   Total Protein 6.5 - 8.1 g/dL 6.6  6.9  6.7   Total Bilirubin 0.3 - 1.2 mg/dL 0.6  0.6  1.0   Alkaline Phos 38 - 126 U/L 70  81  74   AST 15 - 41 U/L 24  22  23    ALT 0 - 44 U/L 15  14  13       RADIOGRAPHIC STUDIES: I have personally  reviewed the radiological images as listed and agreed with the findings in the report. No results found.   ASSESSMENT & PLAN:  No problem-specific Assessment & Plan notes found for this encounter.  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  October 2022- PET scan shows no evidence of progression of disease or any evidence of new disease; liver metastases-slight decrease in size.JUNE 2023Clide Deutscher bleeding-] Stable partially calcified mesenteric mass in the root of the small bowel mesentery consistent with known carcinoid tumor;  No findings for recurrent hepatic metastatic disease. No new sites of metastatic disease; No acute abdominal/pelvic findings.    # Continue Sandostatin monthly. Clinically, no evidence of progression. Labs today reviewed and acceptable for continuation of treatment.     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 Ongoing intermittent rectal bleeding. MARCH 2023-- Iron sat-19%; ferritin- 40]; Hemoglobin 10.4. Stable. Proceed with IV Venofer today.    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 Again encouraged patient to see GI.    # A.fib 2023; March- on eliquis [Dr.Parashoes Cardiologist];  Monitor closely given history of rectal bleeding. See above.   # DISPOSITION- Sandostatin & venofer today Follow up as scheduled   No orders of the defined types were placed in this encounter.  All questions were answered. The patient knows to call the clinic with any problems, questions or concerns.    Verlon Au, NP 09/06/2021

## 2021-09-06 NOTE — Progress Notes (Unsigned)
Returns for follow-up. She has no new concerns at this time. Still has some diarrhea, although states no longer watery.

## 2021-09-09 LAB — CHROMOGRANIN A: Chromogranin A (ng/mL): 84.6 ng/mL (ref 0.0–101.8)

## 2021-09-23 ENCOUNTER — Telehealth: Payer: Self-pay | Admitting: *Deleted

## 2021-09-23 NOTE — Telephone Encounter (Signed)
[name] called reporting that patient wheelchair broke and she needs a new one. [employer] told her that we need to send an order for a new one to them before they will give her another one Fax # for [employer] [phone]. She asks that she be called once faxed

## 2021-09-23 NOTE — Telephone Encounter (Signed)
Dr. Jacinto Reap, do you want to order wheelchair for patient?

## 2021-09-24 NOTE — Telephone Encounter (Signed)
Patient should contact PCP regarding need for a new wheelchair order.

## 2021-09-24 NOTE — Telephone Encounter (Signed)
Call returned to [facility] and informed that she needs to contact patient PCP for this request. She stated that she will do that

## 2021-10-09 ENCOUNTER — Ambulatory Visit: Payer: Medicare Other

## 2021-10-09 ENCOUNTER — Ambulatory Visit: Payer: Medicare Other | Admitting: Internal Medicine

## 2021-10-09 ENCOUNTER — Other Ambulatory Visit: Payer: Medicare Other

## 2021-10-14 ENCOUNTER — Ambulatory Visit: Payer: Medicare Other | Admitting: Internal Medicine

## 2021-10-14 ENCOUNTER — Other Ambulatory Visit: Payer: Medicare Other

## 2021-10-14 ENCOUNTER — Ambulatory Visit: Payer: Medicare Other

## 2021-10-16 ENCOUNTER — Encounter: Payer: Self-pay | Admitting: Nurse Practitioner

## 2021-10-16 ENCOUNTER — Inpatient Hospital Stay (HOSPITAL_BASED_OUTPATIENT_CLINIC_OR_DEPARTMENT_OTHER): Payer: Medicare Other | Admitting: Nurse Practitioner

## 2021-10-16 ENCOUNTER — Inpatient Hospital Stay: Payer: Medicare Other

## 2021-10-16 ENCOUNTER — Encounter: Payer: Self-pay | Admitting: Internal Medicine

## 2021-10-16 ENCOUNTER — Inpatient Hospital Stay: Payer: Medicare Other | Attending: Internal Medicine

## 2021-10-16 VITALS — BP 132/54 | HR 55 | Temp 96.8°F | Resp 16 | Ht 65.0 in | Wt 211.9 lb

## 2021-10-16 DIAGNOSIS — C7A019 Malignant carcinoid tumor of the small intestine, unspecified portion: Secondary | ICD-10-CM | POA: Diagnosis present

## 2021-10-16 DIAGNOSIS — I4891 Unspecified atrial fibrillation: Secondary | ICD-10-CM | POA: Insufficient documentation

## 2021-10-16 DIAGNOSIS — D509 Iron deficiency anemia, unspecified: Secondary | ICD-10-CM | POA: Diagnosis not present

## 2021-10-16 DIAGNOSIS — C7A8 Other malignant neuroendocrine tumors: Secondary | ICD-10-CM | POA: Diagnosis not present

## 2021-10-16 DIAGNOSIS — D500 Iron deficiency anemia secondary to blood loss (chronic): Secondary | ICD-10-CM

## 2021-10-16 DIAGNOSIS — C7B02 Secondary carcinoid tumors of liver: Secondary | ICD-10-CM

## 2021-10-16 DIAGNOSIS — K6389 Other specified diseases of intestine: Secondary | ICD-10-CM

## 2021-10-16 DIAGNOSIS — Z7901 Long term (current) use of anticoagulants: Secondary | ICD-10-CM | POA: Diagnosis not present

## 2021-10-16 DIAGNOSIS — Z86718 Personal history of other venous thrombosis and embolism: Secondary | ICD-10-CM | POA: Insufficient documentation

## 2021-10-16 LAB — COMPREHENSIVE METABOLIC PANEL
ALT: 15 U/L (ref 0–44)
AST: 22 U/L (ref 15–41)
Albumin: 3.7 g/dL (ref 3.5–5.0)
Alkaline Phosphatase: 81 U/L (ref 38–126)
Anion gap: 7 (ref 5–15)
BUN: 17 mg/dL (ref 8–23)
CO2: 23 mmol/L (ref 22–32)
Calcium: 9.2 mg/dL (ref 8.9–10.3)
Chloride: 109 mmol/L (ref 98–111)
Creatinine, Ser: 0.82 mg/dL (ref 0.44–1.00)
GFR, Estimated: >60 mL/min (ref >60)
Glucose, Bld: 145 mg/dL — ABNORMAL HIGH (ref 70–99)
Potassium: 3.6 mmol/L (ref 3.5–5.1)
Sodium: 139 mmol/L (ref 135–145)
Total Bilirubin: 0.8 mg/dL (ref 0.3–1.2)
Total Protein: 6.7 g/dL (ref 6.5–8.1)

## 2021-10-16 LAB — CBC WITH DIFFERENTIAL/PLATELET
Abs Immature Granulocytes: 0.01 10*3/uL (ref 0.00–0.07)
Basophils Absolute: 0 10*3/uL (ref 0.0–0.1)
Basophils Relative: 1 %
Eosinophils Absolute: 0.3 10*3/uL (ref 0.0–0.5)
Eosinophils Relative: 6 %
HCT: 31.2 % — ABNORMAL LOW (ref 36.0–46.0)
Hemoglobin: 10.3 g/dL — ABNORMAL LOW (ref 12.0–15.0)
Immature Granulocytes: 0 %
Lymphocytes Relative: 8 %
Lymphs Abs: 0.4 10*3/uL — ABNORMAL LOW (ref 0.7–4.0)
MCH: 30.7 pg (ref 26.0–34.0)
MCHC: 33 g/dL (ref 30.0–36.0)
MCV: 92.9 fL (ref 80.0–100.0)
Monocytes Absolute: 0.7 10*3/uL (ref 0.1–1.0)
Monocytes Relative: 15 %
Neutro Abs: 3.7 10*3/uL (ref 1.7–7.7)
Neutrophils Relative %: 70 %
Platelets: 189 10*3/uL (ref 150–400)
RBC: 3.36 MIL/uL — ABNORMAL LOW (ref 3.87–5.11)
RDW: 14.5 % (ref 11.5–15.5)
WBC: 5.1 10*3/uL (ref 4.0–10.5)
nRBC: 0 % (ref 0.0–0.2)

## 2021-10-16 LAB — FERRITIN: Ferritin: 14 ng/mL (ref 11–307)

## 2021-10-16 LAB — IRON AND TIBC
Iron: 64 ug/dL (ref 28–170)
Saturation Ratios: 16 % (ref 10.4–31.8)
TIBC: 402 ug/dL (ref 250–450)
UIBC: 338 ug/dL

## 2021-10-16 MED ORDER — SODIUM CHLORIDE 0.9 % IV SOLN
Freq: Once | INTRAVENOUS | Status: AC
  Filled 2021-10-16: qty 250

## 2021-10-16 MED ORDER — OCTREOTIDE ACETATE 30 MG IM KIT
30.0000 mg | PACK | Freq: Once | INTRAMUSCULAR | Status: AC
  Administered 2021-10-16: 30 mg via INTRAMUSCULAR
  Filled 2021-10-16: qty 1

## 2021-10-16 MED ORDER — SODIUM CHLORIDE 0.9 % IV SOLN
200.0000 mg | Freq: Once | INTRAVENOUS | Status: AC
  Administered 2021-10-16: 200 mg via INTRAVENOUS
  Filled 2021-10-16: qty 200

## 2021-10-16 NOTE — Patient Instructions (Addendum)
[facility] CANCER [facility] AT [place]  Discharge Instructions: Thank you for choosing [facility] to provide your oncology and hematology care.  If you have a lab appointment with the [facility], please go directly to the Ward and check in at the registration area.  Wear comfortable clothing and clothing appropriate for easy access to any Portacath or PICC line.   We strive to give you quality time with your provider. You may need to reschedule your appointment if you arrive late (15 or more minutes).  Arriving late affects you and other patients whose appointments are after yours.  Also, if you miss three or more appointments without notifying the office, you may be dismissed from the clinic at the provider's discretion.      For prescription refill requests, have your pharmacy contact our office and allow 72 hours for refills to be completed.    Today you received the following chemotherapy and/or immunotherapy agents Octreotide injection and Iron Sucrose.      To help prevent nausea and vomiting after your treatment, we encourage you to take your nausea medication as directed.  BELOW ARE SYMPTOMS THAT SHOULD BE REPORTED IMMEDIATELY: *FEVER GREATER THAN 100.4 F (38 C) OR HIGHER *CHILLS OR SWEATING *NAUSEA AND VOMITING THAT IS NOT CONTROLLED WITH YOUR NAUSEA MEDICATION *UNUSUAL SHORTNESS OF BREATH *UNUSUAL BRUISING OR BLEEDING *URINARY PROBLEMS (pain or burning when urinating, or frequent urination) *BOWEL PROBLEMS (unusual diarrhea, constipation, pain near the anus) TENDERNESS IN MOUTH AND THROAT WITH OR WITHOUT PRESENCE OF ULCERS (sore throat, sores in mouth, or a toothache) UNUSUAL RASH, SWELLING OR PAIN  UNUSUAL VAGINAL DISCHARGE OR ITCHING   Items with * indicate a potential emergency and should be followed up as soon as possible or go to the Emergency Department if any problems should occur.  Please show the CHEMOTHERAPY ALERT CARD or  IMMUNOTHERAPY ALERT CARD at check-in to the Emergency Department and triage nurse.  Should you have questions after your visit or need to cancel or reschedule your appointment, please contact [facility] CANCER [place] AT [place]  [phone] and follow the prompts.  Office hours are 8:00 a.m. to 4:30 p.m. Monday - Friday. Please note that voicemails left after 4:00 p.m. may not be returned until the following business day.  We are closed weekends and major holidays. You have access to a nurse at all times for urgent questions. Please call the main number to the clinic [phone] and follow the prompts.  For any non-urgent questions, you may also contact your provider using MyChart. We now offer e-Visits for anyone 75 and older to request care online for non-urgent symptoms. For details visit [email or web address].   Also download the MyChart app! Go to the app store, search "MyChart", open the app, select [facility], and log in with your MyChart username and password.  Masks are optional in the cancer centers. If you would like for your care team to wear a mask while they are taking care of you, please let them know. For doctor visits, patients may have with them one support person who is at least 80 years old. At this time, visitors are not allowed in the infusion area.

## 2021-10-16 NOTE — Progress Notes (Signed)
[facility] OFF[facility] PROGRESS NOTE  [name], [name] [name], [name] [name], [name] [name], [name] [name], [name]   Cancer Staging  No matching staging information was found for the patient.  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   # FEB 2018- Ga PET- uptake in liver/ mesenteric mass; NOV 2019- increase sandostatin to 27m q monthly.   #S/p Lutathera x4 every 2 months; last treatment mid September 2022 [Dr.edmunds]; OCT 2022- PET STABLE.   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  In a wheelchair.  Alone.   Emma [id number]. 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 Continues to have some diarrhea intermittently. No flushing, pain. Has chronic blood in stool which is unchanged. Denies new symptoms.   Review of Systems  Constitutional:  Negative for chills, fever, malaise/fatigue and weight loss.  HENT:  Positive for hearing loss (hard of hearing despite hearing aids). Negative for nosebleeds, sore throat and tinnitus.   Eyes:  Negative for blurred vision and double vision.  Respiratory:  Negative for cough, hemoptysis, shortness of breath and wheezing.   Cardiovascular:  Negative for chest pain, palpitations and leg swelling.  Gastrointestinal:  Positive for diarrhea. Negative for abdominal pain, blood in stool, constipation, melena, nausea and vomiting.  Genitourinary:  Negative for dysuria and urgency.  Musculoskeletal:  Negative for back pain, falls, joint pain and myalgias.  Skin:  Negative for itching and rash.  Neurological:  Negative for dizziness, tingling, sensory change, loss of consciousness, weakness and headaches.  Endo/Heme/Allergies:  Negative for environmental allergies. Does not bruise/bleed easily.  Psychiatric/Behavioral:  Negative for depression. The patient is not nervous/anxious and does not have insomnia.      PAST MEDICAL HISTORY :  Past Medical History:  Diagnosis Date   Aromatase inhibitor use    Atrial fibrillation ([facility]    Breast cancer ([facility] 2014   left breast cancer/radiation   Breast cancer,  left breast ([place]) 06/19/2014   CHF (congestive heart failure) (HCC)    recent sepsis   Chronic kidney disease    Dizziness     Dyspnea    recently while admitted   GERD (gastroesophageal reflux disease)    Hearing loss    History of hiatal hernia    History of kidney stones    HOH (hard of hearing)    severe   Hypertension    Hypothyroidism    Leg DVT (deep venous thromboembolism), acute ([place]) 07/2015   left leg after fracture   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   Pain    chest pain while admitted   Personal history of radiation therapy 2015   left breast ca   Thyroid disease     PAST SURGICAL HISTORY :   Past Surgical History:  Procedure Laterality Date   ABDOMINAL HYSTERECTOMY     partial   BREAST LUMPECTOMY Left 01/13/2013   invasive mammary carcinoma, clear margins, LN negative   BREAST LUMPECTOMY WITH SENTINEL LYMPH NODE BIOPSY Left 2014   CHOLECYSTECTOMY     COLONOSCOPY WITH PROPOFOL N/A 09/17/2017   Procedure: COLONOSCOPY WITH PROPOFOL;  Surgeon: Jonathon Bellows, MD;  Location: [facility] ENDOSCOPY;  Service: Gastroenterology;  Laterality: N/A;   CYSTOSCOPY W/ URETERAL STENT PLACEMENT Right 01/07/2016   Procedure: CYSTOSCOPY WITH STENT REPLACEMENT;  Surgeon: Hollice Espy, MD;  Location: [facility] ORS;  Service: Urology;  Laterality: Right;   CYSTOSCOPY WITH RETROGRADE PYELOGRAM, URETEROSCOPY AND STENT PLACEMENT Right 12/19/2015   Procedure: CYSTOSCOPY WITH RETROGRADE PYELOGRAM, URETEROSCOPY AND STENT PLACEMENT;  Surgeon: Ardis Hughs, MD;  Location: [facility] ORS;  Service: Urology;  Laterality: Right;   URETEROSCOPY WITH HOLMIUM LASER LITHOTRIPSY Right 01/07/2016   Procedure: URETEROSCOPY WITH HOLMIUM LASER LITHOTRIPSY;  Surgeon: Hollice Espy, MD;  Location: [facility] ORS;  Service: Urology;  Laterality: Right;    FAMILY HISTORY :   Family History  Problem Relation Age of Onset   Breast cancer Mother 43       deceased 29   Breast cancer Maternal Aunt 33       deceased 6s   Prostate cancer Father        deceased 1   Colon cancer Paternal Aunt 80       deceased 16s    SOCIAL HISTORY:   Social History    Tobacco Use   Smoking status: Never   Smokeless tobacco: Never  Vaping Use   Vaping Use: Never used  Substance Use Topics   Alcohol use: No    Alcohol/week: 0.0 standard drinks of alcohol   Drug use: No    ALLERGIES:  is allergic to sulfa antibiotics.  MEDICATIONS:  Current Outpatient Medications  Medication Sig Dispense Refill   apixaban (ELIQUIS) 5 MG TABS tablet Take 1 tablet (5 mg total) by mouth 2 (two) times daily. 60 tablet 0   diltiazem (CARDIZEM CD) 180 MG 24 hr capsule Take 1 capsule (180 mg total) by mouth daily. 30 capsule 0   furosemide (LASIX) 40 MG tablet Take 40 mg by mouth daily.     labetalol (NORMODYNE) 200 MG tablet Take 200 mg by mouth 2 (two) times daily.      levothyroxine (SYNTHROID) 88 MCG tablet Take 88 mcg by mouth daily before breakfast.      losartan-hydrochlorothiazide (HYZAAR) 100-25 MG tablet Take 1 tablet by mouth daily.     nystatin (MYCOSTATIN/NYSTOP) powder APPLY TO AFFECTED AREA TWICE A DAY 15 g 3   pantoprazole (PROTONIX) 40 MG tablet  TAKE 1 TABLET BY MOUTH EVERY DAY 90 tablet 0   potassium chloride SA (KLOR-CON M20) 20 MEQ tablet TAKE 1 TABLET BY MOUTH EVERY DAY 90 tablet 2   traMADol (ULTRAM) 50 MG tablet 0.5 tablet to 1 tablet every 8 hours as needed for pain 30 tablet 0   Vitamin D, Ergocalciferol, (DRISDOL) 1.25 MG (50000 UNIT) CAPS capsule TAKE 1 CAPSULE BY MOUTH ONE TIME PER WEEK 12 capsule 3   No current facility-administered medications for this visit.   Facility-Administered Medications Ordered in Other Visits  Medication Dose Route Frequency Provider Last Rate Last Admin   octreotide (SANDOSTATIN LAR) 30 MG IM injection             PHYSICAL EXAMINATION: ECOG PERFORMANCE STATUS: 1 - Symptomatic but completely ambulatory  BP (!) 132/54 (BP Location: Right Arm, Patient Position: Sitting, Cuff Size: Large)   Pulse (!) 55   Temp (!) 96.8 F (36 C) (Tympanic)   Resp 16   Ht 5' 5"  (1.651 m)   Wt 211 lb 14.4 oz (96.1 kg)   SpO2  98%   BMI 35.26 kg/m   Filed Weights   10/16/21 1443  Weight: 211 lb 14.4 oz (96.1 kg)    Physical Exam Constitutional:      Appearance: She is not ill-appearing.     Comments: wheelchair  HENT:     Ears:     Comments: Hard of hearing Eyes:     General: No scleral icterus. Cardiovascular:     Rate and Rhythm: Normal rate and regular rhythm.  Abdominal:     General: There is no distension.     Palpations: Abdomen is soft.     Tenderness: There is no abdominal tenderness. There is no guarding.  Musculoskeletal:        General: No deformity.     Right lower leg: No edema.     Left lower leg: No edema.  Lymphadenopathy:     Cervical: No cervical adenopathy.  Skin:    General: Skin is warm and dry.  Neurological:     Mental Status: She is alert and oriented to person, place, and time. Mental status is at baseline.  Psychiatric:        Mood and Affect: Mood normal.        Behavior: Behavior normal.     LABORATORY DATA:  I have reviewed the data as listed Lab Results  Component Value Date   WBC 5.1 10/16/2021   NEUTROABS 3.7 10/16/2021   HGB 10.3 (L) 10/16/2021   HCT 31.2 (L) 10/16/2021   MCV 92.9 10/16/2021   PLT 189 10/16/2021      Latest Ref Rng & Units 10/16/2021    2:05 PM 09/06/2021    1:21 PM 08/08/2021   10:22 AM  CMP  Glucose 70 - 99 mg/dL 145  121  127   BUN 8 - 23 mg/dL 17  18  17    Creatinine 0.44 - 1.00 mg/dL 0.82  0.91  0.88   Sodium 135 - 145 mmol/L 139  139  138   Potassium 3.5 - 5.1 mmol/L 3.6  3.6  3.5   Chloride 98 - 111 mmol/L 109  103  109   CO2 22 - 32 mmol/L 23  25  21    Calcium 8.9 - 10.3 mg/dL 9.2  9.4  9.1   Total Protein 6.5 - 8.1 g/dL 6.7  6.7  6.6   Total Bilirubin 0.3 - 1.2 mg/dL 0.8  0.6  0.6   Alkaline Phos 38 - 126 U/L 81  78  70   AST 15 - 41 U/L 22  22  24    ALT 0 - 44 U/L 15  15  15     Component Ref Range & Units 1 mo ago 2 mo ago 3 mo ago 4 mo ago 6 mo ago 8 mo ago 11 mo ago  Chromogranin A (ng/mL) 0.0 - 101.8 ng/mL  84.6  91.5 CM  86.5 CM  80.7 CM  101.5 CM  93.6 CM  97.1  Iron/TIBC/Ferritin/ %Sat    Component Value Date/Time   IRON 64 10/16/2021 1405   IRON 32 04/24/2014 1114   TIBC 402 10/16/2021 1405   TIBC 495 (H) 04/24/2014 1114   FERRITIN 14 10/16/2021 1405   FERRITIN 9 (L) 04/24/2014 1114   IRONPCTSAT 16 10/16/2021 1405   IRONPCTSAT 6.5 04/24/2014 1114    RADIOGRAPHIC STUDIES: I have personally reviewed the radiological images as listed and agreed with the findings in the report. No results found.   ASSESSMENT & PLAN:  No problem-specific Assessment & Plan notes found for this encounter.  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  October 2022- PET scan showed no evidence of progression of disease or any evidence of new disease; liver metastases-slight decrease in size. JUNE 2023Clide Deutscher bleeding-] Stable partially calcified mesenteric mass in the root of the small bowel mesentery consistent with known carcinoid tumor;  No findings for recurrent hepatic metastatic disease. No new sites of metastatic disease; No acute abdominal/pelvic findings. Clinically, no evidence of progression.  Labs reviewed and acceptable for continuation of treatment.  Proceed with Sandostatin today.   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 Ongoing intermittent rectal bleeding. MARCH 2023-- Iron sat-19%; ferritin- 40]; hemoglobin 10.3, ferritin 14.  Iron sat 16%.  Proceed with IV Venofer today.     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 Again encouraged patient to see GI.    # A.fib 2023; March- on eliquis [Dr.Parashoes Cardiologist];  Monitor closely given history of rectal bleeding. See above.   DISPOSITION- Sandostatin & venofer today 1 mo- lab (cbc, cmp, chromogranin), Dr. Garnetta Buddy, Sandostatin & venofer- la  No orders of the defined types were  placed in this encounter.  All questions were answered. The patient knows to call the clinic with any problems, questions or concerns.    Verlon Au, NP 10/16/2021

## 2021-10-17 LAB — CHROMOGRANIN A: Chromogranin A (ng/mL): 121.3 ng/mL — ABNORMAL HIGH (ref 0.0–101.8)

## 2021-11-19 ENCOUNTER — Encounter: Payer: Self-pay | Admitting: Internal Medicine

## 2021-11-19 ENCOUNTER — Inpatient Hospital Stay: Payer: Medicare Other

## 2021-11-19 ENCOUNTER — Other Ambulatory Visit: Payer: Self-pay | Admitting: Internal Medicine

## 2021-11-19 ENCOUNTER — Inpatient Hospital Stay (HOSPITAL_BASED_OUTPATIENT_CLINIC_OR_DEPARTMENT_OTHER): Payer: Medicare Other | Admitting: Internal Medicine

## 2021-11-19 ENCOUNTER — Inpatient Hospital Stay: Payer: Medicare Other | Attending: Internal Medicine

## 2021-11-19 VITALS — BP 127/58 | HR 48 | Temp 98.0°F | Resp 16

## 2021-11-19 DIAGNOSIS — C7A019 Malignant carcinoid tumor of the small intestine, unspecified portion: Secondary | ICD-10-CM | POA: Insufficient documentation

## 2021-11-19 DIAGNOSIS — C7A8 Other malignant neuroendocrine tumors: Secondary | ICD-10-CM

## 2021-11-19 DIAGNOSIS — Z7901 Long term (current) use of anticoagulants: Secondary | ICD-10-CM | POA: Insufficient documentation

## 2021-11-19 DIAGNOSIS — D509 Iron deficiency anemia, unspecified: Secondary | ICD-10-CM | POA: Insufficient documentation

## 2021-11-19 DIAGNOSIS — E041 Nontoxic single thyroid nodule: Secondary | ICD-10-CM | POA: Diagnosis not present

## 2021-11-19 DIAGNOSIS — C7B02 Secondary carcinoid tumors of liver: Secondary | ICD-10-CM

## 2021-11-19 DIAGNOSIS — D500 Iron deficiency anemia secondary to blood loss (chronic): Secondary | ICD-10-CM

## 2021-11-19 DIAGNOSIS — I4891 Unspecified atrial fibrillation: Secondary | ICD-10-CM | POA: Diagnosis not present

## 2021-11-19 DIAGNOSIS — E876 Hypokalemia: Secondary | ICD-10-CM | POA: Insufficient documentation

## 2021-11-19 DIAGNOSIS — K6389 Other specified diseases of intestine: Secondary | ICD-10-CM

## 2021-11-19 LAB — COMPREHENSIVE METABOLIC PANEL
ALT: 13 U/L (ref 0–44)
AST: 24 U/L (ref 15–41)
Albumin: 3.5 g/dL (ref 3.5–5.0)
Alkaline Phosphatase: 79 U/L (ref 38–126)
Anion gap: 8 (ref 5–15)
BUN: 15 mg/dL (ref 8–23)
CO2: 26 mmol/L (ref 22–32)
Calcium: 8.8 mg/dL — ABNORMAL LOW (ref 8.9–10.3)
Chloride: 105 mmol/L (ref 98–111)
Creatinine, Ser: 0.83 mg/dL (ref 0.44–1.00)
GFR, Estimated: >60 mL/min (ref >60)
Glucose, Bld: 127 mg/dL — ABNORMAL HIGH (ref 70–99)
Potassium: 3.2 mmol/L — ABNORMAL LOW (ref 3.5–5.1)
Sodium: 139 mmol/L (ref 135–145)
Total Bilirubin: 0.6 mg/dL (ref 0.3–1.2)
Total Protein: 6.7 g/dL (ref 6.5–8.1)

## 2021-11-19 LAB — CBC WITH DIFFERENTIAL/PLATELET
Abs Immature Granulocytes: 0.02 10*3/uL (ref 0.00–0.07)
Basophils Absolute: 0 10*3/uL (ref 0.0–0.1)
Basophils Relative: 1 %
Eosinophils Absolute: 0.3 10*3/uL (ref 0.0–0.5)
Eosinophils Relative: 5 %
HCT: 31.1 % — ABNORMAL LOW (ref 36.0–46.0)
Hemoglobin: 10 g/dL — ABNORMAL LOW (ref 12.0–15.0)
Immature Granulocytes: 0 %
Lymphocytes Relative: 7 %
Lymphs Abs: 0.4 10*3/uL — ABNORMAL LOW (ref 0.7–4.0)
MCH: 30 pg (ref 26.0–34.0)
MCHC: 32.2 g/dL (ref 30.0–36.0)
MCV: 93.4 fL (ref 80.0–100.0)
Monocytes Absolute: 0.7 10*3/uL (ref 0.1–1.0)
Monocytes Relative: 13 %
Neutro Abs: 4.1 10*3/uL (ref 1.7–7.7)
Neutrophils Relative %: 74 %
Platelets: 202 10*3/uL (ref 150–400)
RBC: 3.33 MIL/uL — ABNORMAL LOW (ref 3.87–5.11)
RDW: 14.4 % (ref 11.5–15.5)
WBC: 5.5 10*3/uL (ref 4.0–10.5)
nRBC: 0 % (ref 0.0–0.2)

## 2021-11-19 MED ORDER — SODIUM CHLORIDE 0.9 % IV SOLN
200.0000 mg | Freq: Once | INTRAVENOUS | Status: AC
  Administered 2021-11-19: 200 mg via INTRAVENOUS
  Filled 2021-11-19: qty 200

## 2021-11-19 MED ORDER — SODIUM CHLORIDE 0.9 % IV SOLN
Freq: Once | INTRAVENOUS | Status: AC
  Filled 2021-11-19: qty 250

## 2021-11-19 MED ORDER — OCTREOTIDE ACETATE 30 MG IM KIT
30.0000 mg | PACK | Freq: Once | INTRAMUSCULAR | Status: AC
  Administered 2021-11-19: 30 mg via INTRAMUSCULAR
  Filled 2021-11-19: qty 1

## 2021-11-19 NOTE — Progress Notes (Signed)
Patient states she has a kidney infection. Patient needs refill on Potassium chloride.

## 2021-11-19 NOTE — Assessment & Plan Note (Addendum)
#   Non- functional Small bowel/mesenteric carcinoid; s/p Lutathera + sandostatin x4 infusions.  October 2022-PET scan shows no evidence of progression of disease or any evidence of new disease; liver metastases-slight decrease in size.JUNE 2023Clide Deutscher bleeding-] Stable partially calcified mesenteric mass in the root of the small bowel mesentery consistent with known carcinoid tumor;  No findings for recurrent hepatic metastatic disease. No new sites of metastatic disease; No acute abdominal/pelvic findings.  #Continue Sandostatin q M.  No obvious progression of disease noticed.  labs today reviewed; see below re: anemia   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  Declines GI appointment at this time.  # chronic mild diarrhea- ? 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   # Hypokalemia-potassium 3.2; secondary to diuretics.  Will refill.   ## A.fib 2023; March- on eliquis [Dr.Parashoes Cardiologist];  Monitor closely given history of rectal bleeding See above.    # DISPOSITION- #  Ok with Sandostatin today;Venofer-  # Follow up in 1st week of DEC  MD cbc/cmp/chromogranin -Sandstatin; possible venofer- Dr.B

## 2021-11-19 NOTE — Progress Notes (Signed)
[place] OFFICE PROGRESS NOTE  [name], [name] [name], [name] [name], [name] [name], [name] [name], [name]   Cancer Staging  No matching staging information was found for the patient.   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   # FEB 2018- [place] PET- uptake in liver/ mesenteric mass; NOV 2019- increase sandostatin to 35m q monthly.   #S/p Lutathera x4 every 2 months; last treatment mid September 2022 [Dr.edmunds]; OCT 2022- PET STABLE.   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  In a wheelchair.  Alone.   Emma [name] Journey89y.o.  female pleasant patient above history of metastatic carcinoid tumor currently on Sandostatin/; Lutathera infusions every 2 months ; iron deficient anemia question AV malformation; A-fib on Eliquis is here for follow-up.  The interim patient was evaluated by PCP noted to have patient states she has a kidney infection.  Patient waiting to pick her antibiotic.  Patient needs refill on Potassium chloride.  Patient reports chronic diarrhea.  Blood in stool once a day with small blood in bowel intermittently.  None at this time.  She did not make appointment with GI as recommended occasional abdominal pain/soreness.  Review of Systems  Constitutional:  Positive for malaise/fatigue. Negative for chills, diaphoresis, fever and weight loss.  HENT:  Negative for nosebleeds and sore throat.   Eyes:  Negative for double vision.  Respiratory:  Negative for hemoptysis, sputum production, shortness of breath and wheezing.   Cardiovascular:  Negative for chest pain, palpitations and orthopnea.  Gastrointestinal:  Positive for abdominal pain and diarrhea. Negative for constipation, heartburn, melena, nausea and vomiting.  Genitourinary:  Negative for dysuria, frequency and urgency.  Musculoskeletal:  Positive for back pain. Negative for joint pain.  Skin: Negative.  Negative for itching and rash.  Neurological:  Negative for dizziness, tingling, focal weakness, weakness and headaches.  Endo/Heme/Allergies:  Does not bruise/bleed easily.  Psychiatric/Behavioral:  Negative for depression. The patient is not nervous/anxious and does not have insomnia.       PAST MEDICAL HISTORY :  Past  Medical History:  Diagnosis Date   Aromatase inhibitor use    Atrial fibrillation ([place])    Breast cancer ([place]) 2014   left breast cancer/radiation   Breast  cancer, left breast ([place]) 06/19/2014   CHF (congestive heart failure) (HCC)    recent sepsis   Chronic kidney disease    Dizziness    Dyspnea    recently while admitted   GERD (gastroesophageal reflux disease)    Hearing loss    History of hiatal hernia    History of kidney stones    HOH (hard of hearing)    severe   Hypertension    Hypothyroidism    Leg DVT (deep venous thromboembolism), acute ([place]) 07/2015   left leg after fracture   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   Pain    chest pain while admitted   Personal history of radiation therapy 2015   left breast ca   Thyroid disease     PAST SURGICAL HISTORY :   Past Surgical History:  Procedure Laterality Date   ABDOMINAL HYSTERECTOMY     partial   BREAST LUMPECTOMY Left 01/13/2013   invasive mammary carcinoma, clear margins, LN negative   BREAST LUMPECTOMY WITH SENTINEL LYMPH NODE BIOPSY Left 2014   CHOLECYSTECTOMY     COLONOSCOPY WITH PROPOFOL N/A 09/17/2017   Procedure: COLONOSCOPY WITH PROPOFOL;  Surgeon: Jonathon Bellows, MD;  Location: [facility] ENDOSCOPY;  Service: Gastroenterology;  Laterality: N/A;   CYSTOSCOPY W/ URETERAL STENT PLACEMENT Right 01/07/2016   Procedure: CYSTOSCOPY WITH STENT REPLACEMENT;  Surgeon: Hollice Espy, MD;  Location: [facility] ORS;  Service: Urology;  Laterality: Right;   CYSTOSCOPY WITH RETROGRADE PYELOGRAM, URETEROSCOPY AND STENT PLACEMENT Right 12/19/2015   Procedure: CYSTOSCOPY WITH RETROGRADE PYELOGRAM, URETEROSCOPY AND STENT PLACEMENT;  Surgeon: Ardis Hughs, MD;  Location: [facility] ORS;  Service: Urology;  Laterality: Right;   URETEROSCOPY WITH HOLMIUM LASER LITHOTRIPSY Right 01/07/2016   Procedure: URETEROSCOPY WITH HOLMIUM LASER LITHOTRIPSY;  Surgeon: Hollice Espy, MD;  Location: [facility] ORS;  Service: Urology;  Laterality: Right;    FAMILY HISTORY :   Family History  Problem Relation Age of Onset   Breast cancer Mother 14       deceased 9   Breast cancer Maternal Aunt 66       deceased 56s    Prostate cancer Father        deceased 49   Colon cancer Paternal Aunt 80       deceased 43s    SOCIAL HISTORY:   Social History   Tobacco Use   Smoking status: Never   Smokeless tobacco: Never  Vaping Use   Vaping Use: Never used  Substance Use Topics   Alcohol use: No    Alcohol/week: 0.0 standard drinks of alcohol   Drug use: No    ALLERGIES:  is allergic to sulfa antibiotics.  MEDICATIONS:  Current Outpatient Medications  Medication Sig Dispense Refill   apixaban (ELIQUIS) 5 MG TABS tablet Take 1 tablet (5 mg total) by mouth 2 (two) times daily. 60 tablet 0   diltiazem (CARDIZEM CD) 180 MG 24 hr capsule Take 1 capsule (180 mg total) by mouth daily. 30 capsule 0   furosemide (LASIX) 40 MG tablet Take 40 mg by mouth daily.     labetalol (NORMODYNE) 200 MG tablet Take 200 mg by mouth 2 (two) times daily.      levothyroxine (SYNTHROID) 88 MCG tablet Take 88 mcg by mouth daily before breakfast.      losartan-hydrochlorothiazide (HYZAAR) 100-25  MG tablet Take 1 tablet by mouth daily.     nystatin (MYCOSTATIN/NYSTOP) powder APPLY TO AFFECTED AREA TWICE A DAY 15 g 3   pantoprazole (PROTONIX) 40 MG tablet TAKE 1 TABLET BY MOUTH EVERY DAY 90 tablet 0   traMADol (ULTRAM) 50 MG tablet 0.5 tablet to 1 tablet every 8 hours as needed for pain 30 tablet 0   Vitamin D, Ergocalciferol, (DRISDOL) 1.25 MG (50000 UNIT) CAPS capsule TAKE 1 CAPSULE BY MOUTH ONE TIME PER WEEK 12 capsule 3   potassium chloride SA (KLOR-CON M20) 20 MEQ tablet TAKE 1 TABLET BY MOUTH EVERY DAY 90 tablet 0   No current facility-administered medications for this visit.   Facility-Administered Medications Ordered in Other Visits  Medication Dose Route Frequency Provider Last Rate Last Admin   octreotide (SANDOSTATIN LAR) 30 MG IM injection             PHYSICAL EXAMINATION: ECOG PERFORMANCE STATUS: 1 - Symptomatic but completely ambulatory  BP (!) 120/58   Pulse (!) 51   Temp (!) 96.6 F (35.9 C)   Resp 19    Wt 212 lb 4.8 oz (96.3 kg)   SpO2 92%   BMI 35.33 kg/m   Filed Weights   11/19/21 1357  Weight: 212 lb 4.8 oz (96.3 kg)     Physical Exam HENT:     Head: Normocephalic and atraumatic.     Mouth/Throat:     Pharynx: No oropharyngeal exudate.  Eyes:     Pupils: Pupils are equal, round, and reactive to light.  Cardiovascular:     Rate and Rhythm: Normal rate. Rhythm irregular.  Pulmonary:     Effort: Pulmonary effort is normal. No respiratory distress.     Breath sounds: Normal breath sounds. No wheezing.  Abdominal:     General: Bowel sounds are normal. There is no distension.     Palpations: Abdomen is soft. There is no mass.     Tenderness: There is no abdominal tenderness. There is no guarding or rebound.  Musculoskeletal:        General: Swelling present. No tenderness. Normal range of motion.     Cervical back: Normal range of motion and neck supple.  Skin:    General: Skin is warm.  Neurological:     Mental Status: She is alert and oriented to person, place, and time.  Psychiatric:        Mood and Affect: Affect normal.     LABORATORY DATA:  I have reviewed the data as listed    Component Value Date/Time   NA 139 11/19/2021 1318   NA 137 07/12/2013 0841   K 3.2 (L) 11/19/2021 1318   K 4.7 07/12/2013 0841   CL 105 11/19/2021 1318   CL 107 07/12/2013 0841   CO2 26 11/19/2021 1318   CO2 20 (L) 07/12/2013 0841   GLUCOSE 127 (H) 11/19/2021 1318   GLUCOSE 122 (H) 07/12/2013 0841   BUN 15 11/19/2021 1318   BUN 18 07/12/2013 0841   CREATININE 0.83 11/19/2021 1318   CREATININE 0.64 05/22/2014 1111   CALCIUM 8.8 (L) 11/19/2021 1318   CALCIUM 9.2 07/12/2013 0841   PROT 6.7 11/19/2021 1318   PROT 7.0 05/22/2014 1111   ALBUMIN 3.5 11/19/2021 1318   ALBUMIN 4.2 05/22/2014 1111   AST 24 11/19/2021 1318   AST 29 05/22/2014 1111   ALT 13 11/19/2021 1318   ALT 17 05/22/2014 1111   ALKPHOS 79 11/19/2021 1318   ALKPHOS 95 05/22/2014 1111  BILITOT 0.6 11/19/2021  1318   BILITOT 0.8 05/22/2014 1111   GFRNONAA >60 11/19/2021 1318   GFRNONAA >60 05/22/2014 1111   GFRAA >60 10/07/2019 1319   GFRAA >60 05/22/2014 1111    No results found for: "SPEP", "UPEP"  Lab Results  Component Value Date   WBC 5.5 11/19/2021   NEUTROABS 4.1 11/19/2021   HGB 10.0 (L) 11/19/2021   HCT 31.1 (L) 11/19/2021   MCV 93.4 11/19/2021   PLT 202 11/19/2021      Chemistry      Component Value Date/Time   NA 139 11/19/2021 1318   NA 137 07/12/2013 0841   K 3.2 (L) 11/19/2021 1318   K 4.7 07/12/2013 0841   CL 105 11/19/2021 1318   CL 107 07/12/2013 0841   CO2 26 11/19/2021 1318   CO2 20 (L) 07/12/2013 0841   BUN 15 11/19/2021 1318   BUN 18 07/12/2013 0841   CREATININE 0.83 11/19/2021 1318   CREATININE 0.64 05/22/2014 1111      Component Value Date/Time   CALCIUM 8.8 (L) 11/19/2021 1318   CALCIUM 9.2 07/12/2013 0841   ALKPHOS 79 11/19/2021 1318   ALKPHOS 95 05/22/2014 1111   AST 24 11/19/2021 1318   AST 29 05/22/2014 1111   ALT 13 11/19/2021 1318   ALT 17 05/22/2014 1111   BILITOT 0.6 11/19/2021 1318   BILITOT 0.8 05/22/2014 1111       RADIOGRAPHIC STUDIES: I have personally reviewed the radiological images as listed and agreed with the findings in the report. No results found.   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  October 2022-PET scan shows no evidence of progression of disease or any evidence of new disease; liver metastases-slight decrease in size.JUNE 2023Clide Deutscher bleeding-] Stable partially calcified mesenteric mass in the root of the small bowel mesentery consistent with known carcinoid tumor;  No findings for recurrent hepatic metastatic disease. No new sites of metastatic disease; No acute abdominal/pelvic findings.  #Continue Sandostatin q M.  No obvious progression of disease noticed.  labs today reviewed; see  below re: anemia   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  Declines GI appointment at this time.  # chronic mild diarrhea- ? 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   # Hypokalemia-potassium 3.2; secondary to diuretics.  Will refill.   ## A.fib 2023; March- on eliquis [Dr.Parashoes Cardiologist];  Monitor closely given history of rectal bleeding See above.    # DISPOSITION- #  Ok with Sandostatin today;Venofer-  # Follow up in 1st week of DEC  MD cbc/cmp/chromogranin -Sandstatin; possible venofer- Dr.B     Orders Placed This Encounter  Procedures   CBC with Differential/Platelet    Standing Status:   Future    Standing Expiration Date:   11/20/2022   Comprehensive metabolic panel    Standing Status:   Future    Standing Expiration Date:   11/20/2022   Chromogranin A    Standing Status:   Future    Standing Expiration Date:   11/20/2022    All questions were answered. The patient knows to call the clinic with any problems, questions or concerns.      [name], MD 11/19/2021 3:19 PM

## 2021-11-21 LAB — CHROMOGRANIN A: Chromogranin A (ng/mL): 93 ng/mL (ref 0.0–101.8)

## 2021-12-27 ENCOUNTER — Inpatient Hospital Stay (HOSPITAL_BASED_OUTPATIENT_CLINIC_OR_DEPARTMENT_OTHER): Payer: Medicare Other | Admitting: Internal Medicine

## 2021-12-27 ENCOUNTER — Inpatient Hospital Stay: Payer: Medicare Other

## 2021-12-27 ENCOUNTER — Other Ambulatory Visit: Payer: Self-pay | Admitting: *Deleted

## 2021-12-27 ENCOUNTER — Inpatient Hospital Stay: Payer: Medicare Other | Attending: Internal Medicine

## 2021-12-27 ENCOUNTER — Encounter: Payer: Self-pay | Admitting: Internal Medicine

## 2021-12-27 VITALS — BP 142/60 | HR 51 | Temp 98.6°F | Resp 17 | Wt 213.2 lb

## 2021-12-27 DIAGNOSIS — E340 Carcinoid syndrome: Secondary | ICD-10-CM | POA: Diagnosis not present

## 2021-12-27 DIAGNOSIS — I4891 Unspecified atrial fibrillation: Secondary | ICD-10-CM | POA: Insufficient documentation

## 2021-12-27 DIAGNOSIS — C7B02 Secondary carcinoid tumors of liver: Secondary | ICD-10-CM

## 2021-12-27 DIAGNOSIS — Z7901 Long term (current) use of anticoagulants: Secondary | ICD-10-CM | POA: Insufficient documentation

## 2021-12-27 DIAGNOSIS — E041 Nontoxic single thyroid nodule: Secondary | ICD-10-CM | POA: Insufficient documentation

## 2021-12-27 DIAGNOSIS — C7A019 Malignant carcinoid tumor of the small intestine, unspecified portion: Secondary | ICD-10-CM | POA: Diagnosis present

## 2021-12-27 DIAGNOSIS — C7A8 Other malignant neuroendocrine tumors: Secondary | ICD-10-CM

## 2021-12-27 DIAGNOSIS — D509 Iron deficiency anemia, unspecified: Secondary | ICD-10-CM | POA: Diagnosis not present

## 2021-12-27 DIAGNOSIS — E876 Hypokalemia: Secondary | ICD-10-CM | POA: Insufficient documentation

## 2021-12-27 DIAGNOSIS — K6389 Other specified diseases of intestine: Secondary | ICD-10-CM

## 2021-12-27 LAB — COMPREHENSIVE METABOLIC PANEL
ALT: 17 U/L (ref 0–44)
AST: 26 U/L (ref 15–41)
Albumin: 3.4 g/dL — ABNORMAL LOW (ref 3.5–5.0)
Alkaline Phosphatase: 79 U/L (ref 38–126)
Anion gap: 9 (ref 5–15)
BUN: 17 mg/dL (ref 8–23)
CO2: 24 mmol/L (ref 22–32)
Calcium: 9 mg/dL (ref 8.9–10.3)
Chloride: 105 mmol/L (ref 98–111)
Creatinine, Ser: 0.83 mg/dL (ref 0.44–1.00)
GFR, Estimated: >60 mL/min (ref >60)
Glucose, Bld: 135 mg/dL — ABNORMAL HIGH (ref 70–99)
Potassium: 3.6 mmol/L (ref 3.5–5.1)
Sodium: 138 mmol/L (ref 135–145)
Total Bilirubin: 0.5 mg/dL (ref 0.3–1.2)
Total Protein: 6.3 g/dL — ABNORMAL LOW (ref 6.5–8.1)

## 2021-12-27 LAB — CBC WITH DIFFERENTIAL/PLATELET
Abs Immature Granulocytes: 0.01 10*3/uL (ref 0.00–0.07)
Basophils Absolute: 0 10*3/uL (ref 0.0–0.1)
Basophils Relative: 1 %
Eosinophils Absolute: 0.2 10*3/uL (ref 0.0–0.5)
Eosinophils Relative: 4 %
HCT: 32.8 % — ABNORMAL LOW (ref 36.0–46.0)
Hemoglobin: 10.5 g/dL — ABNORMAL LOW (ref 12.0–15.0)
Immature Granulocytes: 0 %
Lymphocytes Relative: 8 %
Lymphs Abs: 0.4 10*3/uL — ABNORMAL LOW (ref 0.7–4.0)
MCH: 29.2 pg (ref 26.0–34.0)
MCHC: 32 g/dL (ref 30.0–36.0)
MCV: 91.4 fL (ref 80.0–100.0)
Monocytes Absolute: 0.8 10*3/uL (ref 0.1–1.0)
Monocytes Relative: 15 %
Neutro Abs: 3.9 10*3/uL (ref 1.7–7.7)
Neutrophils Relative %: 72 %
Platelets: 198 10*3/uL (ref 150–400)
RBC: 3.59 MIL/uL — ABNORMAL LOW (ref 3.87–5.11)
RDW: 14.6 % (ref 11.5–15.5)
WBC: 5.3 10*3/uL (ref 4.0–10.5)
nRBC: 0 % (ref 0.0–0.2)

## 2021-12-27 MED ORDER — SODIUM CHLORIDE 0.9 % IV SOLN
Freq: Once | INTRAVENOUS | Status: AC
  Filled 2021-12-27: qty 250

## 2021-12-27 MED ORDER — SODIUM CHLORIDE 0.9 % IV SOLN
200.0000 mg | Freq: Once | INTRAVENOUS | Status: AC
  Administered 2021-12-27: 200 mg via INTRAVENOUS
  Filled 2021-12-27: qty 200

## 2021-12-27 MED ORDER — OCTREOTIDE ACETATE 30 MG IM KIT
30.0000 mg | PACK | Freq: Once | INTRAMUSCULAR | Status: AC
  Administered 2021-12-27: 30 mg via INTRAMUSCULAR
  Filled 2021-12-27: qty 1

## 2021-12-27 NOTE — Assessment & Plan Note (Addendum)
#   Non- functional Small bowel/mesenteric carcinoid; s/p Lutathera + sandostatin x4 infusions.  October 2022-PET scan shows no evidence of progression of disease or any evidence of new disease; liver metastases-slight decrease in size.JUNE 2023Clide Deutscher bleeding-] Stable partially calcified mesenteric mass in the root of the small bowel mesentery consistent with known carcinoid tumor;  No findings for recurrent hepatic metastatic disease. No new sites of metastatic disease; No acute abdominal/pelvic findings.  #Continue Sandostatin q M.  No obvious progression of disease noticed.  labs today reviewed; see below re: anemia- STABLE.   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  Declines GI appointment at this time.  # chronic mild diarrhea- ? 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   # Hypokalemia-potassium 3.2; secondary to diuretics.    ## A.fib 2023; March- on eliquis [Dr.Parashoes Cardiologist];  Monitor closely given history of rectal bleeding- STABLE.   # DISPOSITION- #  Ok with Sandostatin today;Venofer- # Follow up in 1st week of JAN 2023- cbc/cmp/chromogranin -Sandstatin; possible venofer- Dr.B

## 2021-12-27 NOTE — Progress Notes (Signed)
[place] OFFICE PROGRESS NOTE  [name], [name] [name], [name] [name], [name] [name], [name] [name], [name]   Cancer Staging  No matching staging information was found for the patient.   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   # FEB 2018- Ga PET- uptake in liver/ mesenteric mass; NOV 2019- increase sandostatin to 1m q monthly.   #S/p Lutathera x4 every 2 months; last treatment mid September 2022 [Dr.edmunds]; OCT 2022- PET STABLE.   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  In a wheelchair.  Alone.   [name].[id number].  female pleasant patient above history of metastatic carcinoid tumor currently on Sandostatin/; Lutathera infusions every 2 months ; iron deficient anemia question AV malformation; A-fib on Eliquis is here for follow-up.  Patient denies any blood in stools.  Denies any nausea vomiting.  Denies any chest pain.  No falls.   Review of Systems  Constitutional:  Positive for malaise/fatigue. Negative for chills, diaphoresis, fever and weight loss.  HENT:  Negative for nosebleeds and sore throat.   Eyes:  Negative for double vision.  Respiratory:  Negative for hemoptysis, sputum production, shortness of breath and wheezing.   Cardiovascular:  Negative for chest pain, palpitations and orthopnea.  Gastrointestinal:  Positive for abdominal pain and diarrhea. Negative for constipation, heartburn, melena, nausea and vomiting.  Genitourinary:  Negative for dysuria, frequency and urgency.  Musculoskeletal:  Positive for back pain. Negative for joint pain.  Skin: Negative.  Negative for itching and rash.  Neurological:  Negative for dizziness, tingling, focal weakness, weakness and headaches.  Endo/Heme/Allergies:  Does not bruise/bleed easily.  Psychiatric/Behavioral:  Negative for depression. The patient is not nervous/anxious and does not have insomnia.       PAST MEDICAL HISTORY :  Past Medical History:  Diagnosis Date   Aromatase inhibitor use    Atrial fibrillation ([place]    Breast cancer ([place] 2014   left breast cancer/radiation   Breast cancer, left breast ([place] 06/19/2014   CHF (congestive heart failure) ([place]    recent  sepsis   Chronic kidney disease    Dizziness    Dyspnea    recently while admitted   GERD (gastroesophageal reflux disease)    Hearing loss    History of hiatal hernia    History of kidney stones     HOH (hard of hearing)    severe   Hypertension    Hypothyroidism    Leg DVT (deep venous thromboembolism), acute ([place]) 07/2015   left leg after fracture   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   Pain    chest pain while admitted   Personal history of radiation therapy 2015   left breast ca   Thyroid disease     PAST SURGICAL HISTORY :   Past Surgical History:  Procedure Laterality Date   ABDOMINAL HYSTERECTOMY     partial   BREAST LUMPECTOMY Left 01/13/2013   invasive mammary carcinoma, clear margins, LN negative   BREAST LUMPECTOMY WITH SENTINEL LYMPH NODE BIOPSY Left 2014   CHOLECYSTECTOMY     COLONOSCOPY WITH PROPOFOL N/A 09/17/2017   Procedure: COLONOSCOPY WITH PROPOFOL;  Surgeon: Jonathon Bellows, MD;  Location[facility] ENDOSCOPY;  Service: Gastroenterology;  Laterality: N/A;   CYSTOSCOPY W/ URETERAL STENT PLACEMENT Right 01/07/2016   Procedure: CYSTOSCOPY WITH STENT REPLACEMENT;  Surgeon: Hollice Espy, MD;  Location: [facility] ORS;  Service: Urology;  Laterality: Right;   CYSTOSCOPY WITH RETROGRADE PYELOGRAM, URETEROSCOPY AND STENT PLACEMENT Right 12/19/2015   Procedure: CYSTOSCOPY WITH RETROGRADE PYELOGRAM, URETEROSCOPY AND STENT PLACEMENT;  Surgeon: Ardis Hughs, MD;  Location: [facility] ORS;  Service: Urology;  Laterality: Right;   URETEROSCOPY WITH HOLMIUM LASER LITHOTRIPSY Right 01/07/2016   Procedure: URETEROSCOPY WITH HOLMIUM LASER LITHOTRIPSY;  Surgeon: Hollice Espy, MD;  Location: [facility] ORS;  Service: Urology;  Laterality: Right;    FAMILY HISTORY :   Family History  Problem Relation Age of Onset   Breast cancer Mother 20       deceased 56   Breast cancer Maternal Aunt 60       deceased 85s   Prostate cancer Father        deceased 24   Colon cancer Paternal Aunt 80       deceased 16s    SOCIAL HISTORY:   Social History   Tobacco Use   Smoking status: Never   Smokeless tobacco: Never  Vaping Use   Vaping Use: Never used  Substance Use Topics   Alcohol use: No     Alcohol/week: 0.0 standard drinks of alcohol   Drug use: No    ALLERGIES:  is allergic to sulfa antibiotics.  MEDICATIONS:  Current Outpatient Medications  Medication Sig Dispense Refill   apixaban (ELIQUIS) 5 MG TABS tablet Take 1 tablet (5 mg total) by mouth 2 (two) times daily. 60 tablet 0   diltiazem (CARDIZEM CD) 180 MG 24 hr capsule Take 1 capsule (180 mg total) by mouth daily. 30 capsule 0   furosemide (LASIX) 40 MG tablet Take 40 mg by mouth daily.     labetalol (NORMODYNE) 200 MG tablet Take 200 mg by mouth 2 (two) times daily.      levothyroxine (SYNTHROID) 88 MCG tablet Take 88 mcg by mouth daily before breakfast.      losartan-hydrochlorothiazide (HYZAAR) 100-25 MG tablet Take 1 tablet by mouth daily.     nystatin (MYCOSTATIN/NYSTOP) powder APPLY TO AFFECTED AREA TWICE A DAY 15 g 3   pantoprazole (PROTONIX) 40 MG tablet TAKE 1 TABLET BY MOUTH EVERY DAY 90 tablet 0   potassium chloride SA (  KLOR-CON M20) 20 MEQ tablet TAKE 1 TABLET BY MOUTH EVERY DAY 90 tablet 0   traMADol (ULTRAM) 50 MG tablet 0.5 tablet to 1 tablet every 8 hours as needed for pain 30 tablet 0   Vitamin D, Ergocalciferol, (DRISDOL) 1.25 MG (50000 UNIT) CAPS capsule TAKE 1 CAPSULE BY MOUTH ONE TIME PER WEEK 12 capsule 3   No current facility-administered medications for this visit.   Facility-Administered Medications Ordered in Other Visits  Medication Dose Route Frequency Provider Last Rate Last Admin   octreotide (SANDOSTATIN LAR) 30 MG IM injection             PHYSICAL EXAMINATION: ECOG PERFORMANCE STATUS: 1 - Symptomatic but completely ambulatory  BP (!) 142/60   Pulse (!) 51   Temp 98.6 F (37 C)   Resp 17   Wt 213 lb 3.2 oz (96.7 kg)   SpO2 97%   BMI 35.48 kg/m   Filed Weights   12/27/21 1518  Weight: 213 lb 3.2 oz (96.7 kg)     Physical Exam HENT:     Head: Normocephalic and atraumatic.     Mouth/Throat:     Pharynx: No oropharyngeal exudate.  Eyes:     Pupils: Pupils are equal,  round, and reactive to light.  Cardiovascular:     Rate and Rhythm: Normal rate. Rhythm irregular.  Pulmonary:     Effort: Pulmonary effort is normal. No respiratory distress.     Breath sounds: Normal breath sounds. No wheezing.  Abdominal:     General: Bowel sounds are normal. There is no distension.     Palpations: Abdomen is soft. There is no mass.     Tenderness: There is no abdominal tenderness. There is no guarding or rebound.  Musculoskeletal:        General: Swelling present. No tenderness. Normal range of motion.     Cervical back: Normal range of motion and neck supple.  Skin:    General: Skin is warm.  Neurological:     Mental Status: She is alert and oriented to person, place, and time.  Psychiatric:        Mood and Affect: Affect normal.     LABORATORY DATA:  I have reviewed the data as listed    Component Value Date/Time   NA 138 12/27/2021 1434   NA 137 07/12/2013 0841   K 3.6 12/27/2021 1434   K 4.7 07/12/2013 0841   CL 105 12/27/2021 1434   CL 107 07/12/2013 0841   CO2 24 12/27/2021 1434   CO2 20 (L) 07/12/2013 0841   GLUCOSE 135 (H) 12/27/2021 1434   GLUCOSE 122 (H) 07/12/2013 0841   BUN 17 12/27/2021 1434   BUN 18 07/12/2013 0841   CREATININE 0.83 12/27/2021 1434   CREATININE 0.64 05/22/2014 1111   CALCIUM 9.0 12/27/2021 1434   CALCIUM 9.2 07/12/2013 0841   PROT 6.3 (L) 12/27/2021 1434   PROT 7.0 05/22/2014 1111   ALBUMIN 3.4 (L) 12/27/2021 1434   ALBUMIN 4.2 05/22/2014 1111   AST 26 12/27/2021 1434   AST 29 05/22/2014 1111   ALT 17 12/27/2021 1434   ALT 17 05/22/2014 1111   ALKPHOS 79 12/27/2021 1434   ALKPHOS 95 05/22/2014 1111   BILITOT 0.5 12/27/2021 1434   BILITOT 0.8 05/22/2014 1111   GFRNONAA >60 12/27/2021 1434   GFRNONAA >60 05/22/2014 1111   GFRAA >60 10/07/2019 1319   GFRAA >60 05/22/2014 1111    No results found for: "SPEP", "UPEP"  Lab Results  Component Value Date   WBC 5.3 12/27/2021   NEUTROABS 3.9 12/27/2021   HGB  10.5 (L) 12/27/2021   HCT 32.8 (L) 12/27/2021   MCV 91.4 12/27/2021   PLT 198 12/27/2021      Chemistry      Component Value Date/Time   NA 138 12/27/2021 1434   NA 137 07/12/2013 0841   K 3.6 12/27/2021 1434   K 4.7 07/12/2013 0841   CL 105 12/27/2021 1434   CL 107 07/12/2013 0841   CO2 24 12/27/2021 1434   CO2 20 (L) 07/12/2013 0841   BUN 17 12/27/2021 1434   BUN 18 07/12/2013 0841   CREATININE 0.83 12/27/2021 1434   CREATININE 0.64 05/22/2014 1111      Component Value Date/Time   CALCIUM 9.0 12/27/2021 1434   CALCIUM 9.2 07/12/2013 0841   ALKPHOS 79 12/27/2021 1434   ALKPHOS 95 05/22/2014 1111   AST 26 12/27/2021 1434   AST 29 05/22/2014 1111   ALT 17 12/27/2021 1434   ALT 17 05/22/2014 1111   BILITOT 0.5 12/27/2021 1434   BILITOT 0.8 05/22/2014 1111       RADIOGRAPHIC STUDIES: I have personally reviewed the radiological images as listed and agreed with the findings in the report. No results found.   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  October 2022-PET scan shows no evidence of progression of disease or any evidence of new disease; liver metastases-slight decrease in size.JUNE 2023Clide Deutscher bleeding-] Stable partially calcified mesenteric mass in the root of the small bowel mesentery consistent with known carcinoid tumor;  No findings for recurrent hepatic metastatic disease. No new sites of metastatic disease; No acute abdominal/pelvic findings.  #Continue Sandostatin q M.  No obvious progression of disease noticed.  labs today reviewed; see below re: anemia- STABLE.   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  Declines GI appointment at this time.  # chronic mild diarrhea- ? 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   #  Hypokalemia-potassium 3.2; secondary to diuretics.    ## A.fib 2023; March- on eliquis [Dr.Parashoes Cardiologist];  Monitor closely given history of rectal bleeding- STABLE.   # DISPOSITION- #  Ok with Sandostatin today;Venofer- # Follow up in 1st week of JAN 2023- cbc/cmp/chromogranin -Sandstatin; possible venofer- Dr.B     No orders of the defined types were placed in this encounter.   All questions were answered. The patient knows to call the clinic with any problems, questions or concerns.      [name], MD 12/27/2021 4:08 PM

## 2021-12-27 NOTE — Progress Notes (Signed)
Patient has no concerns today. 

## 2021-12-31 LAB — CHROMOGRANIN A: Chromogranin A (ng/mL): 100.6 ng/mL (ref 0.0–101.8)

## 2022-01-29 ENCOUNTER — Inpatient Hospital Stay: Payer: Medicare Other

## 2022-01-29 ENCOUNTER — Inpatient Hospital Stay (HOSPITAL_BASED_OUTPATIENT_CLINIC_OR_DEPARTMENT_OTHER): Payer: Medicare Other | Admitting: Internal Medicine

## 2022-01-29 ENCOUNTER — Inpatient Hospital Stay: Payer: Medicare Other | Attending: Internal Medicine

## 2022-01-29 ENCOUNTER — Encounter: Payer: Self-pay | Admitting: Internal Medicine

## 2022-01-29 VITALS — BP 103/64 | HR 56 | Temp 97.6°F | Resp 18

## 2022-01-29 VITALS — BP 143/60 | HR 52

## 2022-01-29 DIAGNOSIS — D509 Iron deficiency anemia, unspecified: Secondary | ICD-10-CM | POA: Diagnosis not present

## 2022-01-29 DIAGNOSIS — Z86718 Personal history of other venous thrombosis and embolism: Secondary | ICD-10-CM | POA: Insufficient documentation

## 2022-01-29 DIAGNOSIS — C7B02 Secondary carcinoid tumors of liver: Secondary | ICD-10-CM

## 2022-01-29 DIAGNOSIS — C7A8 Other malignant neuroendocrine tumors: Secondary | ICD-10-CM

## 2022-01-29 DIAGNOSIS — E041 Nontoxic single thyroid nodule: Secondary | ICD-10-CM | POA: Diagnosis not present

## 2022-01-29 DIAGNOSIS — Z7901 Long term (current) use of anticoagulants: Secondary | ICD-10-CM | POA: Insufficient documentation

## 2022-01-29 DIAGNOSIS — K6389 Other specified diseases of intestine: Secondary | ICD-10-CM

## 2022-01-29 DIAGNOSIS — C7A019 Malignant carcinoid tumor of the small intestine, unspecified portion: Secondary | ICD-10-CM | POA: Insufficient documentation

## 2022-01-29 DIAGNOSIS — E340 Carcinoid syndrome: Secondary | ICD-10-CM | POA: Insufficient documentation

## 2022-01-29 LAB — CBC WITH DIFFERENTIAL/PLATELET
Abs Immature Granulocytes: 0.02 10*3/uL (ref 0.00–0.07)
Basophils Absolute: 0 10*3/uL (ref 0.0–0.1)
Basophils Relative: 1 %
Eosinophils Absolute: 0.3 10*3/uL (ref 0.0–0.5)
Eosinophils Relative: 6 %
HCT: 31.6 % — ABNORMAL LOW (ref 36.0–46.0)
Hemoglobin: 10.2 g/dL — ABNORMAL LOW (ref 12.0–15.0)
Immature Granulocytes: 0 %
Lymphocytes Relative: 9 %
Lymphs Abs: 0.5 10*3/uL — ABNORMAL LOW (ref 0.7–4.0)
MCH: 29.1 pg (ref 26.0–34.0)
MCHC: 32.3 g/dL (ref 30.0–36.0)
MCV: 90 fL (ref 80.0–100.0)
Monocytes Absolute: 0.7 10*3/uL (ref 0.1–1.0)
Monocytes Relative: 14 %
Neutro Abs: 3.5 10*3/uL (ref 1.7–7.7)
Neutrophils Relative %: 70 %
Platelets: 209 10*3/uL (ref 150–400)
RBC: 3.51 MIL/uL — ABNORMAL LOW (ref 3.87–5.11)
RDW: 15.4 % (ref 11.5–15.5)
WBC: 5 10*3/uL (ref 4.0–10.5)
nRBC: 0 % (ref 0.0–0.2)

## 2022-01-29 LAB — COMPREHENSIVE METABOLIC PANEL
ALT: 13 U/L (ref 0–44)
AST: 22 U/L (ref 15–41)
Albumin: 3.7 g/dL (ref 3.5–5.0)
Alkaline Phosphatase: 78 U/L (ref 38–126)
Anion gap: 9 (ref 5–15)
BUN: 19 mg/dL (ref 8–23)
CO2: 24 mmol/L (ref 22–32)
Calcium: 8.9 mg/dL (ref 8.9–10.3)
Chloride: 105 mmol/L (ref 98–111)
Creatinine, Ser: 0.8 mg/dL (ref 0.44–1.00)
GFR, Estimated: >60 mL/min (ref >60)
Glucose, Bld: 139 mg/dL — ABNORMAL HIGH (ref 70–99)
Potassium: 3.6 mmol/L (ref 3.5–5.1)
Sodium: 138 mmol/L (ref 135–145)
Total Bilirubin: 0.6 mg/dL (ref 0.3–1.2)
Total Protein: 6.7 g/dL (ref 6.5–8.1)

## 2022-01-29 MED ORDER — SODIUM CHLORIDE 0.9 % IV SOLN
Freq: Once | INTRAVENOUS | Status: AC
  Filled 2022-01-29: qty 250

## 2022-01-29 MED ORDER — SODIUM CHLORIDE 0.9 % IV SOLN
200.0000 mg | Freq: Once | INTRAVENOUS | Status: AC
  Administered 2022-01-29: 200 mg via INTRAVENOUS
  Filled 2022-01-29: qty 200

## 2022-01-29 MED ORDER — OCTREOTIDE ACETATE 30 MG IM KIT
30.0000 mg | PACK | Freq: Once | INTRAMUSCULAR | Status: AC
  Administered 2022-01-29: 30 mg via INTRAMUSCULAR
  Filled 2022-01-29: qty 1

## 2022-01-29 NOTE — Assessment & Plan Note (Addendum)
#   Non- functional Small bowel/mesenteric carcinoid; s/p Lutathera + sandostatin x4 infusions.  October 2022-PET scan shows no evidence of progression of disease or any evidence of new disease; liver metastases-slight decrease in size. JUNE 2023Clide Deutscher bleeding-] Stable partially calcified mesenteric mass in the root of the small bowel mesentery consistent with known carcinoid tumor;  No findings for recurrent hepatic metastatic disease. No new sites of metastatic disease; No acute abdominal/pelvic findings.  #Continue Sandostatin q M.  No obvious progression of disease noticed.  labs today reviewed; see below re: anemia- STABLE.   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  Declines GI appointment at this time.  # chronic mild diarrhea- ? 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   # Hypokalemia-potassium 3.2; secondary to diuretics.    ## A.fib 2023; March- on eliquis [Dr.Parashoes Cardiologist];  Monitor closely given history of rectal bleeding- STABLE.   # DISPOSITION- #  Ok with Sandostatin today;Venofer- # Follow up in 1 months- 2023- cbc/cmp/iron studies; ferritin; LDH; reticulocyte count; chromogranin levels -Sandostatin; possible venofer- Dr.B

## 2022-01-29 NOTE — Progress Notes (Signed)
[name]  Patient Care Team: Emma Pink, [name] Emma Graff, [name] Emma Cowman, [name] Emma Ramsay, [name] Emma Sickle, [name]   Cancer Staging  No matching staging information was found for the patient.   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   # FEB 2018- Ga PET- uptake in liver/ mesenteric mass; NOV 2019- increase sandostatin to 73m q monthly.   #S/p Lutathera x4 every 2 months; last treatment mid September 2022 [Emma [name]]; OCT 2022- PET STABLE.   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       INTERVAL HISTORY: Patient is a poor historian/hard of hearing.  In a wheelchair.  Alone.   [name]; s/p Lutathera infusions every 2 months ; iron deficient anemia question AV malformation; A-fib on Eliquis is here for follow-up.  Patient denies new problems/concerns today.  BP 103/64, HR 56  Patient denies any blood in stools.  Denies any nausea vomiting.  Denies any chest pain.  No falls.   Review of Systems  Constitutional:  Positive for malaise/fatigue. Negative for chills, diaphoresis, fever and weight loss.  HENT:  Negative for nosebleeds and sore throat.   Eyes:  Negative for double vision.  Respiratory:  Negative for hemoptysis, sputum production, shortness of breath and wheezing.   Cardiovascular:  Negative for chest pain, palpitations and orthopnea.  Gastrointestinal:  Positive for abdominal pain and diarrhea. Negative for constipation, heartburn, melena, nausea and vomiting.  Genitourinary:  Negative for dysuria, frequency and urgency.  Musculoskeletal:  Positive for back pain. Negative for joint pain.  Skin: Negative.  Negative for itching and rash.  Neurological:  Negative for dizziness, tingling, focal weakness, weakness and headaches.  Endo/Heme/Allergies:  Does not bruise/bleed easily.  Psychiatric/Behavioral:  Negative for depression. The patient is not nervous/anxious and does not have insomnia.       PAST MEDICAL HISTORY :  Past Medical History:  Diagnosis Date   Aromatase inhibitor use    Atrial fibrillation ([facility]    Breast cancer ([facility] 2014   left breast cancer/radiation   Breast cancer, left breast ([facility]  06/19/2014   CHF (congestive heart failure) (HCC)    recent sepsis   Chronic kidney disease    Dizziness    Dyspnea    recently while admitted   GERD (gastroesophageal reflux disease)    Hearing  loss    History of hiatal hernia    History of kidney stones    HOH (hard of hearing)    severe   Hypertension    Hypothyroidism    Leg DVT (deep venous thromboembolism), acute ([place]) 07/2015   left leg after fracture   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   Pain    chest pain while admitted   Personal history of radiation therapy 2015   left breast ca   Thyroid disease     PAST SURGICAL HISTORY :   Past Surgical History:  Procedure Laterality Date   ABDOMINAL HYSTERECTOMY     partial   BREAST LUMPECTOMY Left 01/13/2013   invasive mammary carcinoma, clear margins, LN negative   BREAST LUMPECTOMY WITH SENTINEL LYMPH NODE BIOPSY Left 2014   CHOLECYSTECTOMY     COLONOSCOPY WITH PROPOFOL N/A 09/17/2017   Procedure: COLONOSCOPY WITH PROPOFOL;  Surgeon: Jonathon Bellows, MD;  Location[facility] ENDOSCOPY;  Service: Gastroenterology;  Laterality: N/A;   CYSTOSCOPY W/ URETERAL STENT PLACEMENT Right 01/07/2016   Procedure: CYSTOSCOPY WITH STENT REPLACEMENT;  Surgeon: Hollice Espy, MD;  Location: [facility] ORS;  Service: Urology;  Laterality: Right;   CYSTOSCOPY WITH RETROGRADE PYELOGRAM, URETEROSCOPY AND STENT PLACEMENT Right 12/19/2015   Procedure: CYSTOSCOPY WITH RETROGRADE PYELOGRAM, URETEROSCOPY AND STENT PLACEMENT;  Surgeon: Ardis Hughs, MD;  Location: [facility] ORS;  Service: Urology;  Laterality: Right;   URETEROSCOPY WITH HOLMIUM LASER LITHOTRIPSY Right 01/07/2016   Procedure: URETEROSCOPY WITH HOLMIUM LASER LITHOTRIPSY;  Surgeon: Hollice Espy, MD;  Location: [facility] ORS;  Service: Urology;  Laterality: Right;    FAMILY HISTORY :   Family History  Problem Relation Age of Onset   Breast cancer Mother 74       deceased 45   Breast cancer Maternal Aunt 57       deceased 58s   Prostate cancer Father        deceased 1   Colon cancer Paternal Aunt 80       deceased 46s    SOCIAL HISTORY:   Social History   Tobacco Use   Smoking status: Never   Smokeless tobacco: Never  Vaping Use   Vaping  Use: Never used  Substance Use Topics   Alcohol use: No    Alcohol/week: 0.0 standard drinks of alcohol   Drug use: No    ALLERGIES:  is allergic to sulfa antibiotics.  MEDICATIONS:  Current Outpatient Medications  Medication Sig Dispense Refill   apixaban (ELIQUIS) 5 MG TABS tablet Take 1 tablet (5 mg total) by mouth 2 (two) times daily. 60 tablet 0   diltiazem (CARDIZEM CD) 180 MG 24 hr capsule Take 1 capsule (180 mg total) by mouth daily. 30 capsule 0   furosemide (LASIX) 40 MG tablet Take 40 mg by mouth daily.     labetalol (NORMODYNE) 200 MG tablet Take 200 mg by mouth 2 (two) times daily.      levothyroxine (SYNTHROID) 88 MCG tablet Take 88 mcg by mouth daily before breakfast.      losartan-hydrochlorothiazide (HYZAAR) 100-25 MG tablet Take 1 tablet by mouth daily.     nystatin (MYCOSTATIN/NYSTOP) powder APPLY TO AFFECTED AREA TWICE A DAY 15 g 3   pantoprazole (PROTONIX) 40 MG tablet TAKE 1 TABLET  BY MOUTH EVERY DAY 90 tablet 0   potassium chloride SA (KLOR-CON M20) 20 MEQ tablet TAKE 1 TABLET BY MOUTH EVERY DAY 90 tablet 0   traMADol (ULTRAM) 50 MG tablet 0.5 tablet to 1 tablet every 8 hours as needed for pain 30 tablet 0   Vitamin D, Ergocalciferol, (DRISDOL) 1.25 MG (50000 UNIT) CAPS capsule TAKE 1 CAPSULE BY MOUTH ONE TIME PER WEEK 12 capsule 3   No current facility-administered medications for this visit.   Facility-Administered Medications Ordered in Other Visits  Medication Dose Route Frequency Provider Last Rate Last Admin   octreotide (SANDOSTATIN LAR) 30 MG IM injection             PHYSICAL EXAMINATION: ECOG PERFORMANCE STATUS: 1 - Symptomatic but completely ambulatory  There were no vitals taken for this visit.  There were no vitals filed for this visit.    Physical Exam HENT:     Head: Normocephalic and atraumatic.     Mouth/Throat:     Pharynx: No oropharyngeal exudate.  Eyes:     Pupils: Pupils are equal, round, and reactive to light.   Cardiovascular:     Rate and Rhythm: Normal rate. Rhythm irregular.  Pulmonary:     Effort: Pulmonary effort is normal. No respiratory distress.     Breath sounds: Normal breath sounds. No wheezing.  Abdominal:     General: Bowel sounds are normal. There is no distension.     Palpations: Abdomen is soft. There is no mass.     Tenderness: There is no abdominal tenderness. There is no guarding or rebound.  Musculoskeletal:        General: Swelling present. No tenderness. Normal range of motion.     Cervical back: Normal range of motion and neck supple.  Skin:    General: Skin is warm.  Neurological:     Mental Status: She is alert and oriented to person, place, and time.  Psychiatric:        Mood and Affect: Affect normal.    LABORATORY DATA:  I have reviewed the data as listed    Component Value Date/Time   NA 138 12/27/2021 1434   NA 137 07/12/2013 0841   K 3.6 12/27/2021 1434   K 4.7 07/12/2013 0841   CL 105 12/27/2021 1434   CL 107 07/12/2013 0841   CO2 24 12/27/2021 1434   CO2 20 (L) 07/12/2013 0841   GLUCOSE 135 (H) 12/27/2021 1434   GLUCOSE 122 (H) 07/12/2013 0841   BUN 17 12/27/2021 1434   BUN 18 07/12/2013 0841   CREATININE 0.83 12/27/2021 1434   CREATININE 0.64 05/22/2014 1111   CALCIUM 9.0 12/27/2021 1434   CALCIUM 9.2 07/12/2013 0841   PROT 6.3 (L) 12/27/2021 1434   PROT 7.0 05/22/2014 1111   ALBUMIN 3.4 (L) 12/27/2021 1434   ALBUMIN 4.2 05/22/2014 1111   AST 26 12/27/2021 1434   AST 29 05/22/2014 1111   ALT 17 12/27/2021 1434   ALT 17 05/22/2014 1111   ALKPHOS 79 12/27/2021 1434   ALKPHOS 95 05/22/2014 1111   BILITOT 0.5 12/27/2021 1434   BILITOT 0.8 05/22/2014 1111   GFRNONAA >60 12/27/2021 1434   GFRNONAA >60 05/22/2014 1111   GFRAA >60 10/07/2019 1319   GFRAA >60 05/22/2014 1111    No results found for: "SPEP", "UPEP"  Lab Results  Component Value Date   WBC 5.3 12/27/2021   NEUTROABS 3.9 12/27/2021   HGB 10.5 (L) 12/27/2021   HCT 32.8  (L)  12/27/2021   MCV 91.4 12/27/2021   PLT 198 12/27/2021      Chemistry      Component Value Date/Time   NA 138 12/27/2021 1434   NA 137 07/12/2013 0841   K 3.6 12/27/2021 1434   K 4.7 07/12/2013 0841   CL 105 12/27/2021 1434   CL 107 07/12/2013 0841   CO2 24 12/27/2021 1434   CO2 20 (L) 07/12/2013 0841   BUN 17 12/27/2021 1434   BUN 18 07/12/2013 0841   CREATININE 0.83 12/27/2021 1434   CREATININE 0.64 05/22/2014 1111      Component Value Date/Time   CALCIUM 9.0 12/27/2021 1434   CALCIUM 9.2 07/12/2013 0841   ALKPHOS 79 12/27/2021 1434   ALKPHOS 95 05/22/2014 1111   AST 26 12/27/2021 1434   AST 29 05/22/2014 1111   ALT 17 12/27/2021 1434   ALT 17 05/22/2014 1111   BILITOT 0.5 12/27/2021 1434   BILITOT 0.8 05/22/2014 1111       RADIOGRAPHIC STUDIES: I have personally reviewed the radiological images as listed and agreed with the findings in the report. No results found.   ASSESSMENT & PLAN:  No problem-specific Assessment & Plan notes found for this encounter.     No orders of the defined types were placed in this encounter.   All questions were answered. The patient knows to call the clinic with any problems, questions or concerns.      Emma Sickle, MD 01/29/2022 8:04 AM

## 2022-01-29 NOTE — Progress Notes (Signed)
Patient denies new problems/concerns today.    BP 103/64, HR 56

## 2022-01-29 NOTE — Patient Instructions (Signed)
Iron Sucrose Injection What is this medication? IRON SUCROSE (EYE ern SOO krose) treats low levels of iron (iron deficiency anemia) in people with kidney disease. Iron is a mineral that plays an important role in making red blood cells, which carry oxygen from your lungs to the rest of your body. This medicine may be used for other purposes; ask your health care provider or pharmacist if you have questions. COMMON BRAND NAME(S): Venofer What should I tell my care team before I take this medication? They need to know if you have any of these conditions: Anemia not caused by low iron levels Heart disease High levels of iron in the blood Kidney disease Liver disease An unusual or allergic reaction to iron, other medications, foods, dyes, or preservatives Pregnant or trying to get pregnant Breastfeeding How should I use this medication? This medication is for infusion into a vein. It is given in a hospital or clinic setting. Talk to your care team about the use of this medication in children. While this medication may be prescribed for children as young as 2 years for selected conditions, precautions do apply. Overdosage: If you think you have taken too much of this medicine contact a poison control center or emergency room at once. NOTE: This medicine is only for you. Do not share this medicine with others. What if I miss a dose? Keep appointments for follow-up doses. It is important not to miss your dose. Call your care team if you are unable to keep an appointment. What may interact with this medication? Do not take this medication with any of the following: Deferoxamine Dimercaprol Other iron products This medication may also interact with the following: Chloramphenicol Deferasirox This list may not describe all possible interactions. Give your health care provider a list of all the medicines, herbs, non-prescription drugs, or dietary supplements you use. Also tell them if you smoke,  drink alcohol, or use illegal drugs. Some items may interact with your medicine. What should I watch for while using this medication? Visit your care team regularly. Tell your care team if your symptoms do not start to get better or if they get worse. You may need blood work done while you are taking this medication. You may need to follow a special diet. Talk to your care team. Foods that contain iron include: whole grains/cereals, dried fruits, beans, or peas, leafy green vegetables, and organ meats (liver, kidney). What side effects may I notice from receiving this medication? Side effects that you should report to your care team as soon as possible: Allergic reactions--skin rash, itching, hives, swelling of the face, lips, tongue, or throat Low blood pressure--dizziness, feeling faint or lightheaded, blurry vision Shortness of breath Side effects that usually do not require medical attention (report to your care team if they continue or are bothersome): Flushing Headache Joint pain Muscle pain Nausea Pain, redness, or irritation at injection site This list may not describe all possible side effects. Call your doctor for medical advice about side effects. You may report side effects to FDA at 1-800-FDA-1088. Where should I keep my medication? This medication is given in a hospital or clinic and will not be stored at home. NOTE: This sheet is a summary. It may not cover all possible information. If you have questions about this medicine, talk to your doctor, pharmacist, or health care provider.  2023 Elsevier/Gold Standard (2020-04-26 00:00:00)  Octreotide Injection Solution What is this medication? OCTREOTIDE (ok TREE oh tide) treats high levels of growth   hormone (acromegaly). It works by reducing the amount of growth hormone your body makes. This reduces symptoms and the risk of health problems caused by too much growth hormone, such as diabetes and heart disease. It may also be used to  treat diarrhea caused by neuroendocrine tumors. It works by slowing down the release of serotonin from the tumor cells. This reduces the number of bowel movements you have. This medicine may be used for other purposes; ask your health care provider or pharmacist if you have questions. COMMON BRAND NAME(S): Bynfezia, Sandostatin What should I tell my care team before I take this medication? They need to know if you have any of these conditions: Diabetes Gallbladder disease Kidney disease Liver disease Thyroid disease An unusual or allergic reaction to octreotide, other medications, foods, dyes, or preservatives Pregnant or trying to get pregnant Breast-feeding How should I use this medication? This medication is injected under the skin or into a vein. It is usually given by your care team in a hospital or clinic setting. If you get this medication at home, you will be taught how to prepare and give it. Use exactly as directed. Take it as directed on the prescription label at the same time every day. Keep taking it unless your care team tells you to stop. Allow the injection solution to come to room temperature before use. Do not warm it artificially. It is important that you put your used needles and syringes in a special sharps container. Do not put them in a trash can. If you do not have a sharps container, call your pharmacist or care team to get one. Talk to your care team about the use of this medication in children. Special care may be needed. Overdosage: If you think you have taken too much of this medicine contact a poison control center or emergency room at once. NOTE: This medicine is only for you. Do not share this medicine with others. What if I miss a dose? If you miss a dose, take it as soon as you can. If it is almost time for your next dose, take only that dose. Do not take double or extra doses. What may interact with this medication? Bromocriptine Certain medications for  blood pressure, heart disease, irregular heartbeat Cyclosporine Diuretics Medications for diabetes, including insulin Quinidine This list may not describe all possible interactions. Give your health care provider a list of all the medicines, herbs, non-prescription drugs, or dietary supplements you use. Also tell them if you smoke, drink alcohol, or use illegal drugs. Some items may interact with your medicine. What should I watch for while using this medication? Visit your care team for regular checks on your progress. Tell your care team if your symptoms do not start to get better or if they get worse. To help reduce irritation at the injection site, use a different site for each injection and make sure the solution is at room temperature before use. This medication may cause decreases in blood sugar. Signs of low blood sugar include chills, cool, pale skin or cold sweats, drowsiness, extreme hunger, fast heartbeat, headache, nausea, nervousness or anxiety, shakiness, trembling, unsteadiness, tiredness, or weakness. Contact your care team right away if you experience any of these symptoms. This medication may increase blood sugar. The risk may be higher in patients who already have diabetes. Ask your care team what you can do to lower your risk of diabetes while taking this medication. You should make sure you get enough vitamin B12   while you are taking this medication. Discuss the foods you eat and the vitamins you take with your care team. What side effects may I notice from receiving this medication? Side effects that you should report to your care team as soon as possible: Allergic reactions--skin rash, itching, hives, swelling of the face, lips, tongue, or throat Gallbladder problems--severe stomach pain, nausea, vomiting, fever Heart rhythm changes--fast or irregular heartbeat, dizziness, feeling faint or lightheaded, chest pain, trouble breathing High blood sugar (hyperglycemia)--increased  thirst or amount of urine, unusual weakness or fatigue, blurry vision Low blood sugar (hypoglycemia)--tremors or shaking, anxiety, sweating, cold or clammy skin, confusion, dizziness, rapid heartbeat Low thyroid levels (hypothyroidism)--unusual weakness or fatigue, increased sensitivity to cold, constipation, hair loss, dry skin, weight gain, feelings of depression Low vitamin B12 level--pain, tingling, or numbness in the hands or feet, muscle weakness, dizziness, confusion, trouble concentrating Pancreatitis--severe stomach pain that spreads to your back or gets worse after eating or when touched, fever, nausea, vomiting Side effects that usually do not require medical attention (report to your care team if they continue or are bothersome): Diarrhea Dizziness Gas Headache Pain, redness, or irritation at injection site Stomach pain This list may not describe all possible side effects. Call your doctor for medical advice about side effects. You may report side effects to FDA at 1-800-FDA-1088. Where should I keep my medication? Keep out of the reach of children and pets. Store in the refrigerator. Protect from light. Allow to come to room temperature naturally. Do not use artificial heat. If protected from light, the injection may be stored between 20 and 30 degrees C (70 and 86 degrees F) for 14 days. After the initial use, throw away any unused portion of a multiple dose vial after 14 days. Get rid of any unused portions of the ampules after use. To get rid of medications that are no longer needed or have expired: Take the medication to a medication take-back program. Ask your pharmacy or law enforcement to find a location. If you cannot return the medication, ask your pharmacist or care team how to get rid of the medication safely. NOTE: This sheet is a summary. It may not cover all possible information. If you have questions about this medicine, talk to your doctor, pharmacist, or health care  provider.  2023 Elsevier/Gold Standard (2021-04-17 00:00:00)  

## 2022-01-30 LAB — CHROMOGRANIN A: Chromogranin A (ng/mL): 95.3 ng/mL (ref 0.0–101.8)

## 2022-02-13 ENCOUNTER — Other Ambulatory Visit: Payer: Self-pay | Admitting: Internal Medicine

## 2022-02-13 NOTE — Telephone Encounter (Signed)
Component Ref Range & Units 2 wk ago 1 mo ago 2 mo ago 4 mo ago 5 mo ago 6 mo ago 7 mo ago  Potassium 3.5 - 5.1 mmol/L 3.6 3.6 3.2 Low  3.6 3.6 3.5 3.5

## 2022-02-24 ENCOUNTER — Encounter: Payer: Self-pay | Admitting: Internal Medicine

## 2022-03-03 ENCOUNTER — Inpatient Hospital Stay (HOSPITAL_BASED_OUTPATIENT_CLINIC_OR_DEPARTMENT_OTHER): Payer: Medicare Other | Admitting: Internal Medicine

## 2022-03-03 ENCOUNTER — Inpatient Hospital Stay: Payer: Medicare Other

## 2022-03-03 ENCOUNTER — Inpatient Hospital Stay: Payer: Medicare Other | Attending: Internal Medicine

## 2022-03-03 VITALS — BP 140/65 | HR 55

## 2022-03-03 VITALS — BP 120/50 | HR 57 | Resp 18 | Wt 209.8 lb

## 2022-03-03 DIAGNOSIS — D509 Iron deficiency anemia, unspecified: Secondary | ICD-10-CM | POA: Diagnosis not present

## 2022-03-03 DIAGNOSIS — I4891 Unspecified atrial fibrillation: Secondary | ICD-10-CM | POA: Diagnosis not present

## 2022-03-03 DIAGNOSIS — E041 Nontoxic single thyroid nodule: Secondary | ICD-10-CM | POA: Insufficient documentation

## 2022-03-03 DIAGNOSIS — C7B02 Secondary carcinoid tumors of liver: Secondary | ICD-10-CM | POA: Diagnosis not present

## 2022-03-03 DIAGNOSIS — E876 Hypokalemia: Secondary | ICD-10-CM | POA: Diagnosis not present

## 2022-03-03 DIAGNOSIS — C7A019 Malignant carcinoid tumor of the small intestine, unspecified portion: Secondary | ICD-10-CM | POA: Insufficient documentation

## 2022-03-03 DIAGNOSIS — Z7901 Long term (current) use of anticoagulants: Secondary | ICD-10-CM | POA: Diagnosis not present

## 2022-03-03 DIAGNOSIS — C7A8 Other malignant neuroendocrine tumors: Secondary | ICD-10-CM

## 2022-03-03 DIAGNOSIS — K6389 Other specified diseases of intestine: Secondary | ICD-10-CM

## 2022-03-03 LAB — CBC WITH DIFFERENTIAL/PLATELET
Abs Immature Granulocytes: 0.01 10*3/uL (ref 0.00–0.07)
Basophils Absolute: 0 10*3/uL (ref 0.0–0.1)
Basophils Relative: 1 %
Eosinophils Absolute: 0.4 10*3/uL (ref 0.0–0.5)
Eosinophils Relative: 8 %
HCT: 32.2 % — ABNORMAL LOW (ref 36.0–46.0)
Hemoglobin: 10.3 g/dL — ABNORMAL LOW (ref 12.0–15.0)
Immature Granulocytes: 0 %
Lymphocytes Relative: 8 %
Lymphs Abs: 0.4 10*3/uL — ABNORMAL LOW (ref 0.7–4.0)
MCH: 28.5 pg (ref 26.0–34.0)
MCHC: 32 g/dL (ref 30.0–36.0)
MCV: 89.2 fL (ref 80.0–100.0)
Monocytes Absolute: 0.8 10*3/uL (ref 0.1–1.0)
Monocytes Relative: 15 %
Neutro Abs: 3.7 10*3/uL (ref 1.7–7.7)
Neutrophils Relative %: 68 %
Platelets: 228 10*3/uL (ref 150–400)
RBC: 3.61 MIL/uL — ABNORMAL LOW (ref 3.87–5.11)
RDW: 15.4 % (ref 11.5–15.5)
WBC: 5.3 10*3/uL (ref 4.0–10.5)
nRBC: 0 % (ref 0.0–0.2)

## 2022-03-03 LAB — RETICULOCYTES
Immature Retic Fract: 11 % (ref 2.3–15.9)
RBC.: 3.57 MIL/uL — ABNORMAL LOW (ref 3.87–5.11)
Retic Count, Absolute: 48.9 10*3/uL (ref 19.0–186.0)
Retic Ct Pct: 1.4 % (ref 0.4–3.1)

## 2022-03-03 LAB — COMPREHENSIVE METABOLIC PANEL
ALT: 12 U/L (ref 0–44)
AST: 20 U/L (ref 15–41)
Albumin: 3.7 g/dL (ref 3.5–5.0)
Alkaline Phosphatase: 88 U/L (ref 38–126)
Anion gap: 9 (ref 5–15)
BUN: 22 mg/dL (ref 8–23)
CO2: 25 mmol/L (ref 22–32)
Calcium: 9.3 mg/dL (ref 8.9–10.3)
Chloride: 104 mmol/L (ref 98–111)
Creatinine, Ser: 0.86 mg/dL (ref 0.44–1.00)
GFR, Estimated: >60 mL/min (ref >60)
Glucose, Bld: 131 mg/dL — ABNORMAL HIGH (ref 70–99)
Potassium: 3.5 mmol/L (ref 3.5–5.1)
Sodium: 138 mmol/L (ref 135–145)
Total Bilirubin: 0.6 mg/dL (ref 0.3–1.2)
Total Protein: 6.9 g/dL (ref 6.5–8.1)

## 2022-03-03 LAB — IRON AND TIBC
Iron: 39 ug/dL (ref 28–170)
Saturation Ratios: 9 % — ABNORMAL LOW (ref 10.4–31.8)
TIBC: 419 ug/dL (ref 250–450)
UIBC: 380 ug/dL

## 2022-03-03 LAB — LACTATE DEHYDROGENASE: LDH: 111 U/L (ref 98–192)

## 2022-03-03 LAB — FERRITIN: Ferritin: 20 ng/mL (ref 11–307)

## 2022-03-03 MED ORDER — SODIUM CHLORIDE 0.9 % IV SOLN
200.0000 mg | Freq: Once | INTRAVENOUS | Status: AC
  Administered 2022-03-03: 200 mg via INTRAVENOUS
  Filled 2022-03-03: qty 200

## 2022-03-03 MED ORDER — SODIUM CHLORIDE 0.9 % IV SOLN
Freq: Once | INTRAVENOUS | Status: AC
  Filled 2022-03-03: qty 250

## 2022-03-03 MED ORDER — OCTREOTIDE ACETATE 30 MG IM KIT
30.0000 mg | PACK | Freq: Once | INTRAMUSCULAR | Status: AC
  Administered 2022-03-03: 30 mg via INTRAMUSCULAR
  Filled 2022-03-03: qty 1

## 2022-03-03 NOTE — Progress Notes (Signed)
[place] OFFICE PROGRESS NOTE  [name], [name] [name], [name] [name], [name] [name], [name] [name], [name]   Cancer Staging  No matching staging information was found for the patient.   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   # FEB 2018- Ga PET- uptake in liver/ mesenteric mass; NOV 2019- increase sandostatin to '30mg'$  q monthly.   #S/p Lutathera x4 every 2 months; last treatment mid September 2022 [Dr.edmunds]; OCT 2022- PET STABLE.   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  In a wheelchair.  Alone.   Emma [name] 82 y.o.  female pleasant patient above history of metastatic carcinoid tumor currently on Sandostatin/; s/p Lutathera infusions every 2 months ; iron deficient anemia question AV malformation; A-fib on Eliquis is here for follow-up.  Patient has nausea just about every day. Pt states she needs some nausea medicine as she doesn't have any. Pt is still eating.   Energy is fair to low. Has leg weakness preventing her from getting up and around. Has diarrhea regularly. Not on any medications.   Patient denies any blood in stools.  Denies any nausea vomiting.  Denies any chest pain.  No falls.   Review of Systems  Constitutional:  Positive for malaise/fatigue. Negative for chills, diaphoresis, fever and weight loss.  HENT:  Negative for nosebleeds and sore throat.   Eyes:  Negative for double vision.  Respiratory:  Negative for hemoptysis, sputum production, shortness of breath and wheezing.   Cardiovascular:  Negative for chest pain, palpitations and orthopnea.  Gastrointestinal:  Positive for abdominal pain and diarrhea. Negative for constipation, heartburn, melena, nausea and vomiting.  Genitourinary:  Negative for dysuria, frequency and urgency.  Musculoskeletal:  Positive for back pain. Negative for joint pain.  Skin: Negative.  Negative for itching and rash.  Neurological:  Negative for dizziness, tingling, focal weakness, weakness and headaches.  Endo/Heme/Allergies:  Does not bruise/bleed easily.  Psychiatric/Behavioral:  Negative for depression. The patient is not nervous/anxious and does not have insomnia.       PAST MEDICAL HISTORY :  Past Medical History:  Diagnosis Date   Aromatase inhibitor use    Atrial fibrillation ([place])    Breast cancer ([place]) 2014   left breast cancer/radiation   Breast cancer, left breast ([place])  06/19/2014   CHF (congestive heart failure) (HCC)    recent sepsis   Chronic kidney disease    Dizziness    Dyspnea    recently while admitted   GERD (gastroesophageal reflux disease)    Hearing loss    History of hiatal hernia    History of kidney stones    HOH (hard of hearing)    severe   Hypertension    Hypothyroidism    Leg DVT (deep venous thromboembolism), acute ([place]) 07/2015   left leg after fracture   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   Pain    chest pain while admitted   Personal history of radiation therapy 2015   left breast ca   Thyroid disease     PAST SURGICAL HISTORY :   Past Surgical History:  Procedure Laterality Date   ABDOMINAL HYSTERECTOMY     partial   BREAST LUMPECTOMY Left 01/13/2013   invasive mammary carcinoma, clear margins, LN negative   BREAST LUMPECTOMY WITH SENTINEL LYMPH NODE BIOPSY Left 2014   CHOLECYSTECTOMY     COLONOSCOPY WITH PROPOFOL N/A 09/17/2017   Procedure: COLONOSCOPY WITH PROPOFOL;  Surgeon: Jonathon Bellows, MD;  Location: [facility] ENDOSCOPY;  Service: Gastroenterology;  Laterality: N/A;   CYSTOSCOPY W/ URETERAL STENT PLACEMENT Right 01/07/2016   Procedure: CYSTOSCOPY WITH STENT REPLACEMENT;  Surgeon: Hollice Espy, MD;  Location: [facility] ORS;  Service: Urology;  Laterality: Right;   CYSTOSCOPY WITH RETROGRADE PYELOGRAM, URETEROSCOPY AND STENT PLACEMENT Right 12/19/2015   Procedure: CYSTOSCOPY WITH RETROGRADE PYELOGRAM, URETEROSCOPY AND STENT PLACEMENT;  Surgeon: Ardis Hughs, MD;  Location: [facility] ORS;  Service: Urology;  Laterality: Right;   URETEROSCOPY WITH HOLMIUM LASER LITHOTRIPSY Right 01/07/2016   Procedure: URETEROSCOPY WITH HOLMIUM LASER LITHOTRIPSY;  Surgeon: Hollice Espy, MD;  Location: [facility] ORS;  Service: Urology;  Laterality: Right;    FAMILY HISTORY :   Family History  Problem Relation Age of Onset   Breast cancer Mother 74       deceased 50   Breast cancer Maternal Aunt 5       deceased 73s   Prostate cancer Father         deceased 35   Colon cancer Paternal Aunt 80       deceased 80s    SOCIAL HISTORY:   Social History   Tobacco Use   Smoking status: Never   Smokeless tobacco: Never  Vaping Use   Vaping Use: Never used  Substance Use Topics   Alcohol use: No    Alcohol/week: 0.0 standard drinks of alcohol   Drug use: No    ALLERGIES:  is allergic to sulfa antibiotics.  MEDICATIONS:  Current Outpatient Medications  Medication Sig Dispense Refill   apixaban (ELIQUIS) 5 MG TABS tablet Take 1 tablet (5 mg total) by mouth 2 (two) times daily. 60 tablet 0   diltiazem (CARDIZEM CD) 180 MG 24 hr capsule Take 1 capsule (180 mg total) by mouth daily. 30 capsule 0   furosemide (LASIX) 40 MG tablet Take 40 mg by mouth daily.     labetalol (NORMODYNE) 200 MG tablet Take 200 mg by mouth 2 (two) times daily. 1/2 tab bid Takes 100 mg twice a day     levothyroxine (SYNTHROID) 88 MCG tablet Take 88 mcg by mouth daily before  breakfast.      losartan-hydrochlorothiazide (HYZAAR) 100-25 MG tablet Take 1 tablet by mouth daily.     nystatin (MYCOSTATIN/NYSTOP) powder APPLY TO AFFECTED AREA TWICE A DAY 15 g 3   potassium chloride SA (KLOR-CON M20) 20 MEQ tablet TAKE 1 TABLET BY MOUTH EVERY DAY 90 tablet 0   Vitamin D, Ergocalciferol, (DRISDOL) 1.25 MG (50000 UNIT) CAPS capsule TAKE 1 CAPSULE BY MOUTH ONE TIME PER WEEK 12 capsule 3   aspirin EC 81 MG tablet Take 81 mg by mouth daily as needed. Pt reports taking if she feels her heart "flutter" (Patient not taking: Reported on 03/03/2022)     pantoprazole (PROTONIX) 40 MG tablet TAKE 1 TABLET BY MOUTH EVERY DAY (Patient not taking: Reported on 01/29/2022) 90 tablet 0   traMADol (ULTRAM) 50 MG tablet 0.5 tablet to 1 tablet every 8 hours as needed for pain (Patient not taking: Reported on 01/29/2022) 30 tablet 0   No current facility-administered medications for this visit.   Facility-Administered Medications Ordered in Other Visits  Medication Dose Route Frequency  Provider Last Rate Last Admin   octreotide (SANDOSTATIN LAR) 30 MG IM injection             PHYSICAL EXAMINATION: ECOG PERFORMANCE STATUS: 1 - Symptomatic but completely ambulatory  BP (!) 120/50 (BP Location: Right Arm, Patient Position: Sitting)   Pulse (!) 57   Resp 18   Wt 209 lb 12.8 oz (95.2 kg)   SpO2 96%   BMI 34.91 kg/m   Filed Weights   03/03/22 1315  Weight: 209 lb 12.8 oz (95.2 kg)      Physical Exam HENT:     Head: Normocephalic and atraumatic.     Mouth/Throat:     Pharynx: No oropharyngeal exudate.  Eyes:     Pupils: Pupils are equal, round, and reactive to light.  Cardiovascular:     Rate and Rhythm: Normal rate. Rhythm irregular.  Pulmonary:     Effort: Pulmonary effort is normal. No respiratory distress.     Breath sounds: Normal breath sounds. No wheezing.  Abdominal:     General: Bowel sounds are normal. There is no distension.     Palpations: Abdomen is soft. There is no mass.     Tenderness: There is no abdominal tenderness. There is no guarding or rebound.  Musculoskeletal:        General: Swelling present. No tenderness. Normal range of motion.     Cervical back: Normal range of motion and neck supple.  Skin:    General: Skin is warm.  Neurological:     Mental Status: She is alert and oriented to person, place, and time.  Psychiatric:        Mood and Affect: Affect normal.     LABORATORY DATA:  I have reviewed the data as listed    Component Value Date/Time   NA 138 03/03/2022 1252   NA 137 07/12/2013 0841   K 3.5 03/03/2022 1252   K 4.7 07/12/2013 0841   CL 104 03/03/2022 1252   CL 107 07/12/2013 0841   CO2 25 03/03/2022 1252   CO2 20 (L) 07/12/2013 0841   GLUCOSE 131 (H) 03/03/2022 1252   GLUCOSE 122 (H) 07/12/2013 0841   BUN 22 03/03/2022 1252   BUN 18 07/12/2013 0841   CREATININE 0.86 03/03/2022 1252   CREATININE 0.64 05/22/2014 1111   CALCIUM 9.3 03/03/2022 1252   CALCIUM 9.2 07/12/2013 0841   PROT 6.9 03/03/2022 1252  PROT 7.0 05/22/2014 1111   ALBUMIN 3.7 03/03/2022 1252   ALBUMIN 4.2 05/22/2014 1111   AST 20 03/03/2022 1252   AST 29 05/22/2014 1111   ALT 12 03/03/2022 1252   ALT 17 05/22/2014 1111   ALKPHOS 88 03/03/2022 1252   ALKPHOS 95 05/22/2014 1111   BILITOT 0.6 03/03/2022 1252   BILITOT 0.8 05/22/2014 1111   GFRNONAA >60 03/03/2022 1252   GFRNONAA >60 05/22/2014 1111   GFRAA >60 10/07/2019 1319   GFRAA >60 05/22/2014 1111    No results found for: "SPEP", "UPEP"  Lab Results  Component Value Date   WBC 5.3 03/03/2022   NEUTROABS 3.7 03/03/2022   HGB 10.3 (L) 03/03/2022   HCT 32.2 (L) 03/03/2022   MCV 89.2 03/03/2022   PLT 228 03/03/2022      Chemistry      Component Value Date/Time   NA 138 03/03/2022 1252   NA 137 07/12/2013 0841   K 3.5 03/03/2022 1252   K 4.7 07/12/2013 0841   CL 104 03/03/2022 1252   CL 107 07/12/2013 0841   CO2 25 03/03/2022 1252   CO2 20 (L) 07/12/2013 0841   BUN 22 03/03/2022 1252   BUN 18 07/12/2013 0841   CREATININE 0.86 03/03/2022 1252   CREATININE 0.64 05/22/2014 1111      Component Value Date/Time   CALCIUM 9.3 03/03/2022 1252   CALCIUM 9.2 07/12/2013 0841   ALKPHOS 88 03/03/2022 1252   ALKPHOS 95 05/22/2014 1111   AST 20 03/03/2022 1252   AST 29 05/22/2014 1111   ALT 12 03/03/2022 1252   ALT 17 05/22/2014 1111   BILITOT 0.6 03/03/2022 1252   BILITOT 0.8 05/22/2014 1111       RADIOGRAPHIC STUDIES: I have personally reviewed the radiological images as listed and agreed with the findings in the report. No results found.   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  October 2022-PET scan shows no evidence of progression of disease or any evidence of new disease; liver metastases-slight decrease in size. JUNE 2023Clide Deutscher bleeding-] Stable partially calcified mesenteric mass in the root of the small bowel  mesentery consistent with known carcinoid tumor;  No findings for recurrent hepatic metastatic disease. No new sites of metastatic disease; No acute abdominal/pelvic findings. Will plan imaging in JUNE 2024.   #Continue Sandostatin q M.  No obvious progression of disease noticed.  labs today reviewed; see below re: anemia- stable  # Iron deficient anemia-? AV malformation; SEP 2023-- Iron sat-16%; ferrtin- 14]; Hemoglobin 10.3-proceed with Venofer today.  Declines GI appointment at this time. 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   # Hypokalemia-potassium 3.2; secondary to diuretics- stable  ## A.fib 2023; March- on eliquis [Dr.Parashoes Cardiologist];  Monitor closely given history of rectal bleeding- stable  # DISPOSITION- #  Ok with Sandostatin today;Venofer- # in 5month- Sandostatin # Follow up in 2 months- 2023- cbc/cmp/ chromogranin levels -Sandostatin; possible venofer- Dr.B     Orders Placed This Encounter  Procedures   CBC with Differential/Platelet    Standing Status:   Future    Standing Expiration Date:   03/04/2023   Comprehensive metabolic panel    Standing Status:   Future    Standing Expiration Date:   03/04/2023   Chromogranin A    Standing Status:   Future  Standing Expiration Date:   03/04/2023    All questions were answered. The patient knows to call the clinic with any problems, questions or concerns.      [name], MD 03/03/2022 9:26 PM

## 2022-03-03 NOTE — Assessment & Plan Note (Signed)
#   Non- functional Small bowel/mesenteric carcinoid; s/p Lutathera + sandostatin x4 infusions.  October 2022-PET scan shows no evidence of progression of disease or any evidence of new disease; liver metastases-slight decrease in size. JUNE 2023Clide Deutscher bleeding-] Stable partially calcified mesenteric mass in the root of the small bowel mesentery consistent with known carcinoid tumor;  No findings for recurrent hepatic metastatic disease. No new sites of metastatic disease; No acute abdominal/pelvic findings. Will plan imaging in JUNE 2024.   #Continue Sandostatin q M.  No obvious progression of disease noticed.  labs today reviewed; see below re: anemia- stable  # Iron deficient anemia-? AV malformation; SEP 2023-- Iron sat-16%; ferrtin- 14]; Hemoglobin 10.3-proceed with Venofer today.  Declines GI appointment at this time. 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   # Hypokalemia-potassium 3.2; secondary to diuretics- stable  ## A.fib 2023; March- on eliquis [Dr.Parashoes Cardiologist];  Monitor closely given history of rectal bleeding- stable  # DISPOSITION- #  Ok with Sandostatin today;Venofer- # in 65month- Sandostatin # Follow up in 2 months- 2023- cbc/cmp/ chromogranin levels -Sandostatin; possible venofer- Dr.B

## 2022-03-03 NOTE — Progress Notes (Signed)
Has nausea just about every day. Pt states she needs some nausea medicine as she doesn't have any.Pt is still eating. Energy is fair to low. Has leg weakness preventing her from getting up and around. Has diarrhea regularly.

## 2022-03-03 NOTE — Patient Instructions (Signed)
Iron Sucrose Injection What is this medication? IRON SUCROSE (EYE ern SOO krose) treats low levels of iron (iron deficiency anemia) in people with kidney disease. Iron is a mineral that plays an important role in making red blood cells, which carry oxygen from your lungs to the rest of your body. This medicine may be used for other purposes; ask your health care provider or pharmacist if you have questions. COMMON BRAND NAME(S): Venofer What should I tell my care team before I take this medication? They need to know if you have any of these conditions: Anemia not caused by low iron levels Heart disease High levels of iron in the blood Kidney disease Liver disease An unusual or allergic reaction to iron, other medications, foods, dyes, or preservatives Pregnant or trying to get pregnant Breastfeeding How should I use this medication? This medication is for infusion into a vein. It is given in a hospital or clinic setting. Talk to your care team about the use of this medication in children. While this medication may be prescribed for children as young as 2 years for selected conditions, precautions do apply. Overdosage: If you think you have taken too much of this medicine contact a poison control center or emergency room at once. NOTE: This medicine is only for you. Do not share this medicine with others. What if I miss a dose? Keep appointments for follow-up doses. It is important not to miss your dose. Call your care team if you are unable to keep an appointment. What may interact with this medication? Do not take this medication with any of the following: Deferoxamine Dimercaprol Other iron products This medication may also interact with the following: Chloramphenicol Deferasirox This list may not describe all possible interactions. Give your health care provider a list of all the medicines, herbs, non-prescription drugs, or dietary supplements you use. Also tell them if you smoke,  drink alcohol, or use illegal drugs. Some items may interact with your medicine. What should I watch for while using this medication? Visit your care team regularly. Tell your care team if your symptoms do not start to get better or if they get worse. You may need blood work done while you are taking this medication. You may need to follow a special diet. Talk to your care team. Foods that contain iron include: whole grains/cereals, dried fruits, beans, or peas, leafy green vegetables, and organ meats (liver, kidney). What side effects may I notice from receiving this medication? Side effects that you should report to your care team as soon as possible: Allergic reactions--skin rash, itching, hives, swelling of the face, lips, tongue, or throat Low blood pressure--dizziness, feeling faint or lightheaded, blurry vision Shortness of breath Side effects that usually do not require medical attention (report to your care team if they continue or are bothersome): Flushing Headache Joint pain Muscle pain Nausea Pain, redness, or irritation at injection site This list may not describe all possible side effects. Call your doctor for medical advice about side effects. You may report side effects to FDA at 1-800-FDA-1088. Where should I keep my medication? This medication is given in a hospital or clinic and will not be stored at home. NOTE: This sheet is a summary. It may not cover all possible information. If you have questions about this medicine, talk to your doctor, pharmacist, or health care provider.  2023 Elsevier/Gold Standard (2020-04-26 00:00:00)  Octreotide Injection Solution What is this medication? OCTREOTIDE (ok TREE oh tide) treats high levels of growth   hormone (acromegaly). It works by reducing the amount of growth hormone your body makes. This reduces symptoms and the risk of health problems caused by too much growth hormone, such as diabetes and heart disease. It may also be used to  treat diarrhea caused by neuroendocrine tumors. It works by slowing down the release of serotonin from the tumor cells. This reduces the number of bowel movements you have. This medicine may be used for other purposes; ask your health care provider or pharmacist if you have questions. COMMON BRAND NAME(S): Bynfezia, Sandostatin What should I tell my care team before I take this medication? They need to know if you have any of these conditions: Diabetes Gallbladder disease Kidney disease Liver disease Thyroid disease An unusual or allergic reaction to octreotide, other medications, foods, dyes, or preservatives Pregnant or trying to get pregnant Breast-feeding How should I use this medication? This medication is injected under the skin or into a vein. It is usually given by your care team in a hospital or clinic setting. If you get this medication at home, you will be taught how to prepare and give it. Use exactly as directed. Take it as directed on the prescription label at the same time every day. Keep taking it unless your care team tells you to stop. Allow the injection solution to come to room temperature before use. Do not warm it artificially. It is important that you put your used needles and syringes in a special sharps container. Do not put them in a trash can. If you do not have a sharps container, call your pharmacist or care team to get one. Talk to your care team about the use of this medication in children. Special care may be needed. Overdosage: If you think you have taken too much of this medicine contact a poison control center or emergency room at once. NOTE: This medicine is only for you. Do not share this medicine with others. What if I miss a dose? If you miss a dose, take it as soon as you can. If it is almost time for your next dose, take only that dose. Do not take double or extra doses. What may interact with this medication? Bromocriptine Certain medications for  blood pressure, heart disease, irregular heartbeat Cyclosporine Diuretics Medications for diabetes, including insulin Quinidine This list may not describe all possible interactions. Give your health care provider a list of all the medicines, herbs, non-prescription drugs, or dietary supplements you use. Also tell them if you smoke, drink alcohol, or use illegal drugs. Some items may interact with your medicine. What should I watch for while using this medication? Visit your care team for regular checks on your progress. Tell your care team if your symptoms do not start to get better or if they get worse. To help reduce irritation at the injection site, use a different site for each injection and make sure the solution is at room temperature before use. This medication may cause decreases in blood sugar. Signs of low blood sugar include chills, cool, pale skin or cold sweats, drowsiness, extreme hunger, fast heartbeat, headache, nausea, nervousness or anxiety, shakiness, trembling, unsteadiness, tiredness, or weakness. Contact your care team right away if you experience any of these symptoms. This medication may increase blood sugar. The risk may be higher in patients who already have diabetes. Ask your care team what you can do to lower your risk of diabetes while taking this medication. You should make sure you get enough vitamin B12   while you are taking this medication. Discuss the foods you eat and the vitamins you take with your care team. What side effects may I notice from receiving this medication? Side effects that you should report to your care team as soon as possible: Allergic reactions--skin rash, itching, hives, swelling of the face, lips, tongue, or throat Gallbladder problems--severe stomach pain, nausea, vomiting, fever Heart rhythm changes--fast or irregular heartbeat, dizziness, feeling faint or lightheaded, chest pain, trouble breathing High blood sugar (hyperglycemia)--increased  thirst or amount of urine, unusual weakness or fatigue, blurry vision Low blood sugar (hypoglycemia)--tremors or shaking, anxiety, sweating, cold or clammy skin, confusion, dizziness, rapid heartbeat Low thyroid levels (hypothyroidism)--unusual weakness or fatigue, increased sensitivity to cold, constipation, hair loss, dry skin, weight gain, feelings of depression Low vitamin B12 level--pain, tingling, or numbness in the hands or feet, muscle weakness, dizziness, confusion, trouble concentrating Pancreatitis--severe stomach pain that spreads to your back or gets worse after eating or when touched, fever, nausea, vomiting Side effects that usually do not require medical attention (report to your care team if they continue or are bothersome): Diarrhea Dizziness Gas Headache Pain, redness, or irritation at injection site Stomach pain This list may not describe all possible side effects. Call your doctor for medical advice about side effects. You may report side effects to FDA at 1-800-FDA-1088. Where should I keep my medication? Keep out of the reach of children and pets. Store in the refrigerator. Protect from light. Allow to come to room temperature naturally. Do not use artificial heat. If protected from light, the injection may be stored between 20 and 30 degrees C (70 and 86 degrees F) for 14 days. After the initial use, throw away any unused portion of a multiple dose vial after 14 days. Get rid of any unused portions of the ampules after use. To get rid of medications that are no longer needed or have expired: Take the medication to a medication take-back program. Ask your pharmacy or law enforcement to find a location. If you cannot return the medication, ask your pharmacist or care team how to get rid of the medication safely. NOTE: This sheet is a summary. It may not cover all possible information. If you have questions about this medicine, talk to your doctor, pharmacist, or health care  provider.  2023 Elsevier/Gold Standard (2021-04-17 00:00:00)  

## 2022-03-05 LAB — CHROMOGRANIN A: Chromogranin A (ng/mL): 49.9 ng/mL (ref 0.0–101.8)

## 2022-03-27 ENCOUNTER — Encounter: Payer: Self-pay | Admitting: Internal Medicine

## 2022-04-01 ENCOUNTER — Inpatient Hospital Stay: Payer: Medicare Other | Attending: Internal Medicine

## 2022-04-01 DIAGNOSIS — C7A8 Other malignant neuroendocrine tumors: Secondary | ICD-10-CM

## 2022-04-01 DIAGNOSIS — E340 Carcinoid syndrome: Secondary | ICD-10-CM | POA: Insufficient documentation

## 2022-04-01 DIAGNOSIS — C7A019 Malignant carcinoid tumor of the small intestine, unspecified portion: Secondary | ICD-10-CM | POA: Diagnosis present

## 2022-04-01 DIAGNOSIS — K6389 Other specified diseases of intestine: Secondary | ICD-10-CM

## 2022-04-01 MED ORDER — OCTREOTIDE ACETATE 30 MG IM KIT
30.0000 mg | PACK | Freq: Once | INTRAMUSCULAR | Status: AC
  Administered 2022-04-01: 30 mg via INTRAMUSCULAR
  Filled 2022-04-01: qty 1

## 2022-04-25 ENCOUNTER — Other Ambulatory Visit: Payer: Self-pay | Admitting: *Deleted

## 2022-04-25 DIAGNOSIS — Z79811 Long term (current) use of aromatase inhibitors: Secondary | ICD-10-CM

## 2022-04-25 DIAGNOSIS — C7A019 Malignant carcinoid tumor of the small intestine, unspecified portion: Secondary | ICD-10-CM

## 2022-04-25 MED ORDER — VITAMIN D (ERGOCALCIFEROL) 1.25 MG (50000 UNIT) PO CAPS: ORAL_CAPSULE | ORAL | 3 refills | Status: DC

## 2022-05-02 ENCOUNTER — Inpatient Hospital Stay (HOSPITAL_BASED_OUTPATIENT_CLINIC_OR_DEPARTMENT_OTHER): Payer: Medicare Other | Admitting: Internal Medicine

## 2022-05-02 ENCOUNTER — Encounter: Payer: Self-pay | Admitting: Internal Medicine

## 2022-05-02 ENCOUNTER — Inpatient Hospital Stay: Payer: Medicare Other | Attending: Internal Medicine

## 2022-05-02 ENCOUNTER — Inpatient Hospital Stay: Payer: Medicare Other

## 2022-05-02 VITALS — BP 134/54 | HR 56

## 2022-05-02 VITALS — BP 111/54 | HR 55 | Temp 96.4°F | Resp 16 | Wt 208.0 lb

## 2022-05-02 DIAGNOSIS — E340 Carcinoid syndrome: Secondary | ICD-10-CM | POA: Diagnosis not present

## 2022-05-02 DIAGNOSIS — E041 Nontoxic single thyroid nodule: Secondary | ICD-10-CM | POA: Insufficient documentation

## 2022-05-02 DIAGNOSIS — D509 Iron deficiency anemia, unspecified: Secondary | ICD-10-CM | POA: Diagnosis not present

## 2022-05-02 DIAGNOSIS — E876 Hypokalemia: Secondary | ICD-10-CM | POA: Insufficient documentation

## 2022-05-02 DIAGNOSIS — C7A8 Other malignant neuroendocrine tumors: Secondary | ICD-10-CM | POA: Diagnosis not present

## 2022-05-02 DIAGNOSIS — C7B02 Secondary carcinoid tumors of liver: Secondary | ICD-10-CM | POA: Diagnosis not present

## 2022-05-02 DIAGNOSIS — I4891 Unspecified atrial fibrillation: Secondary | ICD-10-CM | POA: Diagnosis not present

## 2022-05-02 DIAGNOSIS — C7A019 Malignant carcinoid tumor of the small intestine, unspecified portion: Secondary | ICD-10-CM | POA: Diagnosis present

## 2022-05-02 DIAGNOSIS — Z7901 Long term (current) use of anticoagulants: Secondary | ICD-10-CM | POA: Diagnosis not present

## 2022-05-02 DIAGNOSIS — K6389 Other specified diseases of intestine: Secondary | ICD-10-CM

## 2022-05-02 LAB — CBC WITH DIFFERENTIAL/PLATELET
Abs Immature Granulocytes: 0.02 10*3/uL (ref 0.00–0.07)
Basophils Absolute: 0 10*3/uL (ref 0.0–0.1)
Basophils Relative: 1 %
Eosinophils Absolute: 0.3 10*3/uL (ref 0.0–0.5)
Eosinophils Relative: 6 %
HCT: 28.9 % — ABNORMAL LOW (ref 36.0–46.0)
Hemoglobin: 9.2 g/dL — ABNORMAL LOW (ref 12.0–15.0)
Immature Granulocytes: 0 %
Lymphocytes Relative: 7 %
Lymphs Abs: 0.4 10*3/uL — ABNORMAL LOW (ref 0.7–4.0)
MCH: 28.3 pg (ref 26.0–34.0)
MCHC: 31.8 g/dL (ref 30.0–36.0)
MCV: 88.9 fL (ref 80.0–100.0)
Monocytes Absolute: 0.9 10*3/uL (ref 0.1–1.0)
Monocytes Relative: 15 %
Neutro Abs: 4.3 10*3/uL (ref 1.7–7.7)
Neutrophils Relative %: 71 %
Platelets: 216 10*3/uL (ref 150–400)
RBC: 3.25 MIL/uL — ABNORMAL LOW (ref 3.87–5.11)
RDW: 14.8 % (ref 11.5–15.5)
WBC: 6 10*3/uL (ref 4.0–10.5)
nRBC: 0 % (ref 0.0–0.2)

## 2022-05-02 LAB — COMPREHENSIVE METABOLIC PANEL
ALT: 12 U/L (ref 0–44)
AST: 20 U/L (ref 15–41)
Albumin: 3.5 g/dL (ref 3.5–5.0)
Alkaline Phosphatase: 91 U/L (ref 38–126)
Anion gap: 6 (ref 5–15)
BUN: 20 mg/dL (ref 8–23)
CO2: 24 mmol/L (ref 22–32)
Calcium: 8.8 mg/dL — ABNORMAL LOW (ref 8.9–10.3)
Chloride: 105 mmol/L (ref 98–111)
Creatinine, Ser: 0.81 mg/dL (ref 0.44–1.00)
GFR, Estimated: >60 mL/min (ref >60)
Glucose, Bld: 122 mg/dL — ABNORMAL HIGH (ref 70–99)
Potassium: 3.5 mmol/L (ref 3.5–5.1)
Sodium: 135 mmol/L (ref 135–145)
Total Bilirubin: 0.4 mg/dL (ref 0.3–1.2)
Total Protein: 6.6 g/dL (ref 6.5–8.1)

## 2022-05-02 MED ORDER — OCTREOTIDE ACETATE 30 MG IM KIT
30.0000 mg | PACK | Freq: Once | INTRAMUSCULAR | Status: AC
  Administered 2022-05-02: 30 mg via INTRAMUSCULAR

## 2022-05-02 MED ORDER — SODIUM CHLORIDE 0.9 % IV SOLN
Freq: Once | INTRAVENOUS | Status: AC
  Filled 2022-05-02: qty 250

## 2022-05-02 MED ORDER — SODIUM CHLORIDE 0.9 % IV SOLN
200.0000 mg | Freq: Once | INTRAVENOUS | Status: AC
  Administered 2022-05-02: 200 mg via INTRAVENOUS
  Filled 2022-05-02: qty 10

## 2022-05-02 NOTE — Patient Instructions (Addendum)
Iron Sucrose Injection What is this medication? IRON SUCROSE (EYE ern SOO krose) treats low levels of iron (iron deficiency anemia) in people with kidney disease. Iron is a mineral that plays an important role in making red blood cells, which carry oxygen from your lungs to the rest of your body. This medicine may be used for other purposes; ask your health care provider or pharmacist if you have questions. COMMON BRAND NAME(S): Venofer What should I tell my care team before I take this medication? They need to know if you have any of these conditions: Anemia not caused by low iron levels Heart disease High levels of iron in the blood Kidney disease Liver disease An unusual or allergic reaction to iron, other medications, foods, dyes, or preservatives Pregnant or trying to get pregnant Breastfeeding How should I use this medication? This medication is for infusion into a vein. It is given in a hospital or clinic setting. Talk to your care team about the use of this medication in children. While this medication may be prescribed for children as young as 2 years for selected conditions, precautions do apply. Overdosage: If you think you have taken too much of this medicine contact a poison control center or emergency room at once. NOTE: This medicine is only for you. Do not share this medicine with others. What if I miss a dose? Keep appointments for follow-up doses. It is important not to miss your dose. Call your care team if you are unable to keep an appointment. What may interact with this medication? Do not take this medication with any of the following: Deferoxamine Dimercaprol Other iron products This medication may also interact with the following: Chloramphenicol Deferasirox This list may not describe all possible interactions. Give your health care provider a list of all the medicines, herbs, non-prescription drugs, or dietary supplements you use. Also tell them if you smoke,  drink alcohol, or use illegal drugs. Some items may interact with your medicine. What should I watch for while using this medication? Visit your care team regularly. Tell your care team if your symptoms do not start to get better or if they get worse. You may need blood work done while you are taking this medication. You may need to follow a special diet. Talk to your care team. Foods that contain iron include: whole grains/cereals, dried fruits, beans, or peas, leafy green vegetables, and organ meats (liver, kidney). What side effects may I notice from receiving this medication? Side effects that you should report to your care team as soon as possible: Allergic reactions--skin rash, itching, hives, swelling of the face, lips, tongue, or throat Low blood pressure--dizziness, feeling faint or lightheaded, blurry vision Shortness of breath Side effects that usually do not require medical attention (report to your care team if they continue or are bothersome): Flushing Headache Joint pain Muscle pain Nausea Pain, redness, or irritation at injection site This list may not describe all possible side effects. Call your doctor for medical advice about side effects. You may report side effects to FDA at 1-800-FDA-1088. Where should I keep my medication? This medication is given in a hospital or clinic and will not be stored at home. NOTE: This sheet is a summary. It may not cover all possible information. If you have questions about this medicine, talk to your doctor, pharmacist, or health care provider.  2023 Elsevier/Gold Standard (2020-04-26 00:00:00) Octreotide Injection Solution What is this medication? OCTREOTIDE (ok TREE oh tide) treats high levels of growth hormone (  acromegaly). It works by reducing the amount of growth hormone your body makes. This reduces symptoms and the risk of health problems caused by too much growth hormone, such as diabetes and heart disease. It may also be used to  treat diarrhea caused by neuroendocrine tumors. It works by slowing down the release of serotonin from the tumor cells. This reduces the number of bowel movements you have. This medicine may be used for other purposes; ask your health care provider or pharmacist if you have questions. COMMON BRAND NAME(S): Bynfezia, Sandostatin What should I tell my care team before I take this medication? They need to know if you have any of these conditions: Diabetes Gallbladder disease Kidney disease Liver disease Thyroid disease An unusual or allergic reaction to octreotide, other medications, foods, dyes, or preservatives Pregnant or trying to get pregnant Breast-feeding How should I use this medication? This medication is injected under the skin or into a vein. It is usually given by your care team in a hospital or clinic setting. If you get this medication at home, you will be taught how to prepare and give it. Use exactly as directed. Take it as directed on the prescription label at the same time every day. Keep taking it unless your care team tells you to stop. Allow the injection solution to come to room temperature before use. Do not warm it artificially. It is important that you put your used needles and syringes in a special sharps container. Do not put them in a trash can. If you do not have a sharps container, call your pharmacist or care team to get one. Talk to your care team about the use of this medication in children. Special care may be needed. Overdosage: If you think you have taken too much of this medicine contact a poison control center or emergency room at once. NOTE: This medicine is only for you. Do not share this medicine with others. What if I miss a dose? If you miss a dose, take it as soon as you can. If it is almost time for your next dose, take only that dose. Do not take double or extra doses. What may interact with this medication? Bromocriptine Certain medications for  blood pressure, heart disease, irregular heartbeat Cyclosporine Diuretics Medications for diabetes, including insulin Quinidine This list may not describe all possible interactions. Give your health care provider a list of all the medicines, herbs, non-prescription drugs, or dietary supplements you use. Also tell them if you smoke, drink alcohol, or use illegal drugs. Some items may interact with your medicine. What should I watch for while using this medication? Visit your care team for regular checks on your progress. Tell your care team if your symptoms do not start to get better or if they get worse. To help reduce irritation at the injection site, use a different site for each injection and make sure the solution is at room temperature before use. This medication may cause decreases in blood sugar. Signs of low blood sugar include chills, cool, pale skin or cold sweats, drowsiness, extreme hunger, fast heartbeat, headache, nausea, nervousness or anxiety, shakiness, trembling, unsteadiness, tiredness, or weakness. Contact your care team right away if you experience any of these symptoms. This medication may increase blood sugar. The risk may be higher in patients who already have diabetes. Ask your care team what you can do to lower your risk of diabetes while taking this medication. You should make sure you get enough vitamin B12 while   you are taking this medication. Discuss the foods you eat and the vitamins you take with your care team. What side effects may I notice from receiving this medication? Side effects that you should report to your care team as soon as possible: Allergic reactions--skin rash, itching, hives, swelling of the face, lips, tongue, or throat Gallbladder problems--severe stomach pain, nausea, vomiting, fever Heart rhythm changes--fast or irregular heartbeat, dizziness, feeling faint or lightheaded, chest pain, trouble breathing High blood sugar (hyperglycemia)--increased  thirst or amount of urine, unusual weakness or fatigue, blurry vision Low blood sugar (hypoglycemia)--tremors or shaking, anxiety, sweating, cold or clammy skin, confusion, dizziness, rapid heartbeat Low thyroid levels (hypothyroidism)--unusual weakness or fatigue, increased sensitivity to cold, constipation, hair loss, dry skin, weight gain, feelings of depression Low vitamin B12 level--pain, tingling, or numbness in the hands or feet, muscle weakness, dizziness, confusion, trouble concentrating Pancreatitis--severe stomach pain that spreads to your back or gets worse after eating or when touched, fever, nausea, vomiting Side effects that usually do not require medical attention (report to your care team if they continue or are bothersome): Diarrhea Dizziness Gas Headache Pain, redness, or irritation at injection site Stomach pain This list may not describe all possible side effects. Call your doctor for medical advice about side effects. You may report side effects to FDA at 1-800-FDA-1088. Where should I keep my medication? Keep out of the reach of children and pets. Store in the refrigerator. Protect from light. Allow to come to room temperature naturally. Do not use artificial heat. If protected from light, the injection may be stored between 20 and 30 degrees C (70 and 86 degrees F) for 14 days. After the initial use, throw away any unused portion of a multiple dose vial after 14 days. Get rid of any unused portions of the ampules after use. To get rid of medications that are no longer needed or have expired: Take the medication to a medication take-back program. Ask your pharmacy or law enforcement to find a location. If you cannot return the medication, ask your pharmacist or care team how to get rid of the medication safely. NOTE: This sheet is a summary. It may not cover all possible information. If you have questions about this medicine, talk to your doctor, pharmacist, or health care  provider.  2023 Elsevier/Gold Standard (2021-04-17 00:00:00)   

## 2022-05-02 NOTE — Progress Notes (Signed)
Pt in for follow up, denies any concerns today. 

## 2022-05-02 NOTE — Assessment & Plan Note (Addendum)
#   Non- functional Small bowel/mesenteric carcinoid; s/p Lutathera + sandostatin x4 infusions.  October 2022-PET scan shows no evidence of progression of disease or any evidence of new disease; liver metastases-slight decrease in size. JUNE 2023Alyce Pagan bleeding-] Stable partially calcified mesenteric mass in the root of the small bowel mesentery consistent with known carcinoid tumor;  No findings for recurrent hepatic metastatic disease. No new sites of metastatic disease; No acute abdominal/pelvic findings. Stable.   #Continue Sandostatin q M.  No obvious progression of disease noticed.  labs today reviewed; see below re: anemia- stable order CT CAP in 2 month.   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  Declined GI appointment in past.   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   # Hypokalemia-potassium 3.2; secondary to diuretics- -stable.   ## A.fib 2023; March- on eliquis [Dr.Parashoes Cardiologist];  Monitor closely given history of rectal bleeding--stable.   # DISPOSITION- #  Ok with Sandostatin today;Venofer- # in 1 week- venofer # in 59months- Sandostatin/venofer # Follow up in 2 months-MD; labs-- cbc/cmp/ chromogranin levels; iron studies;ferritin -Sandostatin; possible venofer- prior CT AP -  Dr.B

## 2022-05-02 NOTE — Progress Notes (Signed)
[facility] OFFICE PROGRESS NOTE  Patient Care Team: Emma [name], James, [name] Emma [name], [name] A, [name] Emma [name], Alexander, [name] Emma [name], David P, [name] Emma [name], Tamila Gaulin R, [name]   Cancer Staging  No matching staging information was found for the patient.   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   # FEB 2018- Ga PET- uptake in liver/ mesenteric mass; NOV 2019- increase sandostatin to 30mg  q monthly.   #S/p Lutathera x4 every 2 months; last treatment mid September 2022 [Dr.edmunds]; OCT 2022- PET STABLE.   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  In a wheelchair.  Alone.   Emma [name] 82 y.o.  female pleasant patient above history of metastatic carcinoid tumor currently on Sandostatin/; s/p Lutathera infusions every 2 months ; iron deficient anemia question AV malformation; A-fib on Eliquis is here for follow-up.   Pt in for follow up, denies any concerns today.   Energy is fair to low.  Patient continues to have chronic diarrhea regularly. Not on any medications.  Not any worse.  Chronic intermittent blood in stools starting worse.   Denies any nausea vomiting.  Denies any chest pain.  No falls.   Review of Systems  Constitutional:  Positive for malaise/fatigue. Negative for chills, diaphoresis, fever and weight loss.  HENT:  Negative for nosebleeds and sore throat.   Eyes:  Negative for double vision.  Respiratory:  Negative for hemoptysis, sputum production, shortness of breath and wheezing.   Cardiovascular:  Negative for chest pain, palpitations and orthopnea.  Gastrointestinal:  Positive for abdominal pain and diarrhea. Negative for constipation, heartburn, melena, nausea and vomiting.  Genitourinary:  Negative for dysuria, frequency and urgency.  Musculoskeletal:  Positive for back pain. Negative for joint pain.  Skin: Negative.  Negative for itching and rash.  Neurological:  Negative for dizziness, tingling, focal weakness, weakness and headaches.  Endo/Heme/Allergies:  Does not bruise/bleed easily.  Psychiatric/Behavioral:  Negative for depression. The patient is not nervous/anxious and does not have insomnia.       PAST MEDICAL HISTORY :  Past Medical History:  Diagnosis Date   Aromatase inhibitor use  Atrial fibrillation    Breast cancer 2014   left breast cancer/radiation   Breast cancer, left breast 06/19/2014   CHF (congestive heart failure)    recent sepsis   Chronic kidney disease    Dizziness     Dyspnea    recently while admitted   GERD (gastroesophageal reflux disease)    Hearing loss    History of hiatal hernia    History of kidney stones    HOH (hard of hearing)    severe   Hypertension    Hypothyroidism    Leg DVT (deep venous thromboembolism), acute 07/2015   left leg after fracture   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   Pain    chest pain while admitted   Personal history of radiation therapy 2015   left breast ca   Thyroid disease     PAST SURGICAL HISTORY :   Past Surgical History:  Procedure Laterality Date   ABDOMINAL HYSTERECTOMY     partial   BREAST LUMPECTOMY Left 01/13/2013   invasive mammary carcinoma, clear margins, LN negative   BREAST LUMPECTOMY WITH SENTINEL LYMPH NODE BIOPSY Left 2014   CHOLECYSTECTOMY     COLONOSCOPY WITH PROPOFOL N/A 09/17/2017   Procedure: COLONOSCOPY WITH PROPOFOL;  Surgeon: Wyline Mood, MD;  Location: [facility] ENDOSCOPY;  Service: Gastroenterology;  Laterality: N/A;   CYSTOSCOPY W/ URETERAL STENT PLACEMENT Right 01/07/2016   Procedure: CYSTOSCOPY WITH STENT REPLACEMENT;  Surgeon: Vanna Scotland, MD;  Location: [facility] ORS;  Service: Urology;  Laterality: Right;   CYSTOSCOPY WITH RETROGRADE PYELOGRAM, URETEROSCOPY AND STENT PLACEMENT Right 12/19/2015   Procedure: CYSTOSCOPY WITH RETROGRADE PYELOGRAM, URETEROSCOPY AND STENT PLACEMENT;  Surgeon: Crist Fat, MD;  Location: [facility] ORS;  Service: Urology;  Laterality: Right;   URETEROSCOPY WITH HOLMIUM LASER LITHOTRIPSY Right 01/07/2016   Procedure: URETEROSCOPY WITH HOLMIUM LASER LITHOTRIPSY;  Surgeon: Vanna Scotland, MD;  Location: [facility] ORS;  Service: Urology;  Laterality: Right;    FAMILY HISTORY :   Family History  Problem Relation Age of Onset   Breast cancer Mother 109       deceased 47   Breast cancer Maternal Aunt 68       deceased 80s   Prostate cancer Father        deceased 78   Colon cancer Paternal Aunt 105       deceased 68s    SOCIAL HISTORY:   Social History    Tobacco Use   Smoking status: Never   Smokeless tobacco: Never  Vaping Use   Vaping Use: Never used  Substance Use Topics   Alcohol use: No    Alcohol/week: 0.0 standard drinks of alcohol   Drug use: No    ALLERGIES:  is allergic to sulfa antibiotics.  MEDICATIONS:  Current Outpatient Medications  Medication Sig Dispense Refill   amoxicillin (AMOXIL) 500 MG capsule Take 500 mg by mouth every 8 (eight) hours.     apixaban (ELIQUIS) 5 MG TABS tablet Take 1 tablet (5 mg total) by mouth 2 (two) times daily. 60 tablet 0   diltiazem (CARDIZEM CD) 180 MG 24 hr capsule Take 1 capsule (180 mg total) by mouth daily. 30 capsule 0   furosemide (LASIX) 40 MG tablet Take 40 mg by mouth daily.     labetalol (NORMODYNE) 100 MG tablet Take 100 mg by mouth 2 (two) times daily.     levothyroxine (SYNTHROID) 88 MCG tablet Take 88 mcg by mouth daily before breakfast.      losartan-hydrochlorothiazide (HYZAAR) 100-25 MG  tablet Take 1 tablet by mouth daily.     nystatin (MYCOSTATIN/NYSTOP) powder APPLY TO AFFECTED AREA TWICE A DAY 15 g 3   potassium chloride SA (KLOR-CON M20) 20 MEQ tablet TAKE 1 TABLET BY MOUTH EVERY DAY 90 tablet 0   Vitamin D, Ergocalciferol, (DRISDOL) 1.25 MG (50000 UNIT) CAPS capsule TAKE 1 CAPSULE BY MOUTH ONE TIME PER WEEK 12 capsule 3   aspirin EC 81 MG tablet Take 81 mg by mouth daily as needed. Pt reports taking if she feels her heart "flutter" (Patient not taking: Reported on 03/03/2022)     pantoprazole (PROTONIX) 40 MG tablet TAKE 1 TABLET BY MOUTH EVERY DAY (Patient not taking: Reported on 01/29/2022) 90 tablet 0   TIADYLT ER 180 MG 24 hr capsule Take 180 mg by mouth daily. (Patient not taking: Reported on 05/02/2022)     traMADol (ULTRAM) 50 MG tablet 0.5 tablet to 1 tablet every 8 hours as needed for pain (Patient not taking: Reported on 01/29/2022) 30 tablet 0   No current facility-administered medications for this visit.   Facility-Administered Medications Ordered in Other  Visits  Medication Dose Route Frequency Provider Last Rate Last Admin   iron sucrose (VENOFER) 200 mg in sodium chloride 0.9 % 100 mL IVPB  200 mg Intravenous Once Alinda Dooms, NP 440 mL/hr at 05/02/22 1411 200 mg at 05/02/22 1411   octreotide (SANDOSTATIN LAR) 30 MG IM injection            octreotide (SANDOSTATIN LAR) IM injection 30 mg  30 mg Intramuscular Once Emma Coder, MD        PHYSICAL EXAMINATION: ECOG PERFORMANCE STATUS: 1 - Symptomatic but completely ambulatory  BP (!) 111/54 (BP Location: Right Arm, Patient Position: Sitting)   Pulse (!) 55   Temp (!) 96.4 F (35.8 C) (Tympanic)   Resp 16   Wt 208 lb (94.3 kg)   SpO2 97%   BMI 34.61 kg/m   Filed Weights   05/02/22 1326  Weight: 208 lb (94.3 kg)      Physical Exam HENT:     Head: Normocephalic and atraumatic.     Mouth/Throat:     Pharynx: No oropharyngeal exudate.  Eyes:     Pupils: Pupils are equal, round, and reactive to light.  Cardiovascular:     Rate and Rhythm: Normal rate. Rhythm irregular.  Pulmonary:     Effort: Pulmonary effort is normal. No respiratory distress.     Breath sounds: Normal breath sounds. No wheezing.  Abdominal:     General: Bowel sounds are normal. There is no distension.     Palpations: Abdomen is soft. There is no mass.     Tenderness: There is no abdominal tenderness. There is no guarding or rebound.  Musculoskeletal:        General: Swelling present. No tenderness. Normal range of motion.     Cervical back: Normal range of motion and neck supple.  Skin:    General: Skin is warm.  Neurological:     Mental Status: She is alert and oriented to person, place, and time.  Psychiatric:        Mood and Affect: Affect normal.     LABORATORY DATA:  I have reviewed the data as listed    Component Value Date/Time   NA 135 05/02/2022 1256   NA 137 07/12/2013 0841   K 3.5 05/02/2022 1256   K 4.7 07/12/2013 0841   CL 105 05/02/2022 1256   CL 107  07/12/2013 0841    CO2 24 05/02/2022 1256   CO2 20 (L) 07/12/2013 0841   GLUCOSE 122 (H) 05/02/2022 1256   GLUCOSE 122 (H) 07/12/2013 0841   BUN 20 05/02/2022 1256   BUN 18 07/12/2013 0841   CREATININE 0.81 05/02/2022 1256   CREATININE 0.64 05/22/2014 1111   CALCIUM 8.8 (L) 05/02/2022 1256   CALCIUM 9.2 07/12/2013 0841   PROT 6.6 05/02/2022 1256   PROT 7.0 05/22/2014 1111   ALBUMIN 3.5 05/02/2022 1256   ALBUMIN 4.2 05/22/2014 1111   AST 20 05/02/2022 1256   AST 29 05/22/2014 1111   ALT 12 05/02/2022 1256   ALT 17 05/22/2014 1111   ALKPHOS 91 05/02/2022 1256   ALKPHOS 95 05/22/2014 1111   BILITOT 0.4 05/02/2022 1256   BILITOT 0.8 05/22/2014 1111   GFRNONAA >60 05/02/2022 1256   GFRNONAA >60 05/22/2014 1111   GFRAA >60 10/07/2019 1319   GFRAA >60 05/22/2014 1111    No results found for: "SPEP", "UPEP"  Lab Results  Component Value Date   WBC 6.0 05/02/2022   NEUTROABS 4.3 05/02/2022   HGB 9.2 (L) 05/02/2022   HCT 28.9 (L) 05/02/2022   MCV 88.9 05/02/2022   PLT 216 05/02/2022      Chemistry      Component Value Date/Time   NA 135 05/02/2022 1256   NA 137 07/12/2013 0841   K 3.5 05/02/2022 1256   K 4.7 07/12/2013 0841   CL 105 05/02/2022 1256   CL 107 07/12/2013 0841   CO2 24 05/02/2022 1256   CO2 20 (L) 07/12/2013 0841   BUN 20 05/02/2022 1256   BUN 18 07/12/2013 0841   CREATININE 0.81 05/02/2022 1256   CREATININE 0.64 05/22/2014 1111      Component Value Date/Time   CALCIUM 8.8 (L) 05/02/2022 1256   CALCIUM 9.2 07/12/2013 0841   ALKPHOS 91 05/02/2022 1256   ALKPHOS 95 05/22/2014 1111   AST 20 05/02/2022 1256   AST 29 05/22/2014 1111   ALT 12 05/02/2022 1256   ALT 17 05/22/2014 1111   BILITOT 0.4 05/02/2022 1256   BILITOT 0.8 05/22/2014 1111       RADIOGRAPHIC STUDIES: I have personally reviewed the radiological images as listed and agreed with the findings in the report. No results found.   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  October 2022-PET scan shows no evidence of progression of disease or any evidence of new disease; liver metastases-slight decrease in size. JUNE 2023Alyce Pagan bleeding-] Stable partially calcified mesenteric mass in the root of the small bowel mesentery consistent with known carcinoid tumor;  No findings for recurrent hepatic metastatic disease. No new sites of metastatic disease; No acute abdominal/pelvic findings. Stable.   #Continue Sandostatin q M.  No obvious progression of disease noticed.  labs today reviewed; see below re: anemia- stable order CT CAP in 2 month.   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  Declined GI appointment in past.   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   # Hypokalemia-potassium 3.2; secondary to diuretics- -stable.   ## A.fib 2023; March- on eliquis [Dr.Parashoes Cardiologist];  Monitor closely given history of rectal bleeding--stable.   # DISPOSITION- #  Ok with Sandostatin today;Venofer- # in 1 week- venofer # in  43months- Sandostatin/venofer # Follow up in 2 months-MD; labs-- cbc/cmp/ chromogranin levels; iron studies;ferritin -Sandostatin; possible venofer- prior CT AP -  Dr.B      Orders Placed This Encounter  Procedures   CT ABDOMEN PELVIS W CONTRAST    Standing Status:   Future    Standing Expiration Date:   05/02/2023    Order Specific Question:   If indicated for the ordered procedure, I authorize the administration of contrast media per Radiology protocol    Answer:   Yes    Order Specific Question:   Preferred imaging location?    Answer:   [facility]    Order Specific Question:   Radiology Contrast Protocol - do NOT remove file path    Answer:    [email or web address]   CBC with Differential (Cancer Center Only)    Standing Status:   Future    Standing Expiration Date:   05/02/2023   CMP (Cancer Center only)    Standing Status:   Future    Standing Expiration Date:   05/02/2023   Chromogranin A    Standing Status:   Future    Standing Expiration Date:   05/02/2023   Iron and TIBC([facility])    Standing Status:   Future    Standing Expiration Date:   05/02/2023   Ferritin    Standing Status:   Future    Standing Expiration Date:   05/02/2023   CMP (Cancer Center only)    Standing Status:   Future    Standing Expiration Date:   05/02/2023   CBC with Differential (Cancer Center Only)    Standing Status:   Future    Standing Expiration Date:   04/28/2023   Chromogranin A    Standing Status:   Future    Standing Expiration Date:   05/02/2023    All questions were answered. The patient knows to call the clinic with any problems, questions or concerns.      Emma Coder, MD 05/02/2022 2:16 PM

## 2022-05-05 LAB — CHROMOGRANIN A: Chromogranin A (ng/mL): 116 ng/mL — ABNORMAL HIGH (ref 0.0–101.8)

## 2022-05-09 ENCOUNTER — Inpatient Hospital Stay: Payer: Medicare Other

## 2022-05-09 VITALS — BP 132/52 | HR 56 | Temp 98.0°F | Resp 17

## 2022-05-09 DIAGNOSIS — C7A019 Malignant carcinoid tumor of the small intestine, unspecified portion: Secondary | ICD-10-CM | POA: Diagnosis not present

## 2022-05-09 DIAGNOSIS — C7A8 Other malignant neuroendocrine tumors: Secondary | ICD-10-CM

## 2022-05-09 DIAGNOSIS — K6389 Other specified diseases of intestine: Secondary | ICD-10-CM

## 2022-05-09 MED ORDER — SODIUM CHLORIDE 0.9 % IV SOLN
Freq: Once | INTRAVENOUS | Status: AC
  Filled 2022-05-09: qty 250

## 2022-05-09 MED ORDER — SODIUM CHLORIDE 0.9% FLUSH
10.0000 mL | INTRAVENOUS | Status: DC | PRN
  Administered 2022-05-09: 10 mL
  Filled 2022-05-09: qty 10

## 2022-05-09 MED ORDER — SODIUM CHLORIDE 0.9 % IV SOLN
200.0000 mg | Freq: Once | INTRAVENOUS | Status: AC
  Administered 2022-05-09: 200 mg via INTRAVENOUS
  Filled 2022-05-09: qty 200

## 2022-05-09 NOTE — Patient Instructions (Signed)

## 2022-05-09 NOTE — Progress Notes (Signed)
Patient tolerated Venofer infusion well, no questions/concerns voiced. Monitored 30 min post transfusion. Patient stable at discharge. VSS. AVS given.    

## 2022-05-21 ENCOUNTER — Encounter: Payer: Self-pay | Admitting: Internal Medicine

## 2022-05-22 ENCOUNTER — Encounter: Payer: Self-pay | Admitting: *Deleted

## 2022-05-23 ENCOUNTER — Other Ambulatory Visit: Payer: Self-pay | Admitting: Internal Medicine

## 2022-06-02 ENCOUNTER — Inpatient Hospital Stay: Payer: Medicare Other | Attending: Internal Medicine

## 2022-06-02 VITALS — BP 130/66 | HR 58 | Temp 98.4°F | Resp 18

## 2022-06-02 DIAGNOSIS — K6389 Other specified diseases of intestine: Secondary | ICD-10-CM

## 2022-06-02 DIAGNOSIS — D509 Iron deficiency anemia, unspecified: Secondary | ICD-10-CM | POA: Insufficient documentation

## 2022-06-02 DIAGNOSIS — C7A019 Malignant carcinoid tumor of the small intestine, unspecified portion: Secondary | ICD-10-CM | POA: Insufficient documentation

## 2022-06-02 DIAGNOSIS — E340 Carcinoid syndrome: Secondary | ICD-10-CM | POA: Diagnosis not present

## 2022-06-02 DIAGNOSIS — C7A8 Other malignant neuroendocrine tumors: Secondary | ICD-10-CM

## 2022-06-02 MED ORDER — SODIUM CHLORIDE 0.9 % IV SOLN
200.0000 mg | Freq: Once | INTRAVENOUS | Status: AC
  Administered 2022-06-02: 200 mg via INTRAVENOUS
  Filled 2022-06-02: qty 200

## 2022-06-02 MED ORDER — OCTREOTIDE ACETATE 30 MG IM KIT
30.0000 mg | PACK | Freq: Once | INTRAMUSCULAR | Status: AC
  Administered 2022-06-02: 30 mg via INTRAMUSCULAR
  Filled 2022-06-02: qty 1

## 2022-06-02 MED ORDER — SODIUM CHLORIDE 0.9 % IV SOLN
Freq: Once | INTRAVENOUS | Status: AC
  Filled 2022-06-02: qty 250

## 2022-06-02 NOTE — Patient Instructions (Addendum)
Iron Sucrose Injection What is this medication? IRON SUCROSE (EYE ern SOO krose) treats low levels of iron (iron deficiency anemia) in people with kidney disease. Iron is a mineral that plays an important role in making red blood cells, which carry oxygen from your lungs to the rest of your body. This medicine may be used for other purposes; ask your health care provider or pharmacist if you have questions. COMMON BRAND NAME(S): Venofer What should I tell my care team before I take this medication? They need to know if you have any of these conditions: Anemia not caused by low iron levels Heart disease High levels of iron in the blood Kidney disease Liver disease An unusual or allergic reaction to iron, other medications, foods, dyes, or preservatives Pregnant or trying to get pregnant Breastfeeding How should I use this medication? This medication is for infusion into a vein. It is given in a hospital or clinic setting. Talk to your care team about the use of this medication in children. While this medication may be prescribed for children as young as 2 years for selected conditions, precautions do apply. Overdosage: If you think you have taken too much of this medicine contact a poison control center or emergency room at once. NOTE: This medicine is only for you. Do not share this medicine with others. What if I miss a dose? Keep appointments for follow-up doses. It is important not to miss your dose. Call your care team if you are unable to keep an appointment. What may interact with this medication? Do not take this medication with any of the following: Deferoxamine Dimercaprol Other iron products This medication may also interact with the following: Chloramphenicol Deferasirox This list may not describe all possible interactions. Give your health care provider a list of all the medicines, herbs, non-prescription drugs, or dietary supplements you use. Also tell them if you smoke,  drink alcohol, or use illegal drugs. Some items may interact with your medicine. What should I watch for while using this medication? Visit your care team regularly. Tell your care team if your symptoms do not start to get better or if they get worse. You may need blood work done while you are taking this medication. You may need to follow a special diet. Talk to your care team. Foods that contain iron include: whole grains/cereals, dried fruits, beans, or peas, leafy green vegetables, and organ meats (liver, kidney). What side effects may I notice from receiving this medication? Side effects that you should report to your care team as soon as possible: Allergic reactions--skin rash, itching, hives, swelling of the face, lips, tongue, or throat Low blood pressure--dizziness, feeling faint or lightheaded, blurry vision Shortness of breath Side effects that usually do not require medical attention (report to your care team if they continue or are bothersome): Flushing Headache Joint pain Muscle pain Nausea Pain, redness, or irritation at injection site This list may not describe all possible side effects. Call your doctor for medical advice about side effects. You may report side effects to FDA at 1-800-FDA-1088. Where should I keep my medication? This medication is given in a hospital or clinic and will not be stored at home. NOTE: This sheet is a summary. It may not cover all possible information. If you have questions about this medicine, talk to your doctor, pharmacist, or health care provider.  2023 Elsevier/Gold Standard (2020-04-26 00:00:00) Octreotide Injection Solution What is this medication? OCTREOTIDE (ok TREE oh tide) treats high levels of growth hormone (  acromegaly). It works by reducing the amount of growth hormone your body makes. This reduces symptoms and the risk of health problems caused by too much growth hormone, such as diabetes and heart disease. It may also be used to  treat diarrhea caused by neuroendocrine tumors. It works by slowing down the release of serotonin from the tumor cells. This reduces the number of bowel movements you have. This medicine may be used for other purposes; ask your health care provider or pharmacist if you have questions. COMMON BRAND NAME(S): Bynfezia, Sandostatin What should I tell my care team before I take this medication? They need to know if you have any of these conditions: Diabetes Gallbladder disease Kidney disease Liver disease Thyroid disease An unusual or allergic reaction to octreotide, other medications, foods, dyes, or preservatives Pregnant or trying to get pregnant Breast-feeding How should I use this medication? This medication is injected under the skin or into a vein. It is usually given by your care team in a hospital or clinic setting. If you get this medication at home, you will be taught how to prepare and give it. Use exactly as directed. Take it as directed on the prescription label at the same time every day. Keep taking it unless your care team tells you to stop. Allow the injection solution to come to room temperature before use. Do not warm it artificially. It is important that you put your used needles and syringes in a special sharps container. Do not put them in a trash can. If you do not have a sharps container, call your pharmacist or care team to get one. Talk to your care team about the use of this medication in children. Special care may be needed. Overdosage: If you think you have taken too much of this medicine contact a poison control center or emergency room at once. NOTE: This medicine is only for you. Do not share this medicine with others. What if I miss a dose? If you miss a dose, take it as soon as you can. If it is almost time for your next dose, take only that dose. Do not take double or extra doses. What may interact with this medication? Bromocriptine Certain medications for  blood pressure, heart disease, irregular heartbeat Cyclosporine Diuretics Medications for diabetes, including insulin Quinidine This list may not describe all possible interactions. Give your health care provider a list of all the medicines, herbs, non-prescription drugs, or dietary supplements you use. Also tell them if you smoke, drink alcohol, or use illegal drugs. Some items may interact with your medicine. What should I watch for while using this medication? Visit your care team for regular checks on your progress. Tell your care team if your symptoms do not start to get better or if they get worse. To help reduce irritation at the injection site, use a different site for each injection and make sure the solution is at room temperature before use. This medication may cause decreases in blood sugar. Signs of low blood sugar include chills, cool, pale skin or cold sweats, drowsiness, extreme hunger, fast heartbeat, headache, nausea, nervousness or anxiety, shakiness, trembling, unsteadiness, tiredness, or weakness. Contact your care team right away if you experience any of these symptoms. This medication may increase blood sugar. The risk may be higher in patients who already have diabetes. Ask your care team what you can do to lower your risk of diabetes while taking this medication. You should make sure you get enough vitamin B12 while   you are taking this medication. Discuss the foods you eat and the vitamins you take with your care team. What side effects may I notice from receiving this medication? Side effects that you should report to your care team as soon as possible: Allergic reactions--skin rash, itching, hives, swelling of the face, lips, tongue, or throat Gallbladder problems--severe stomach pain, nausea, vomiting, fever Heart rhythm changes--fast or irregular heartbeat, dizziness, feeling faint or lightheaded, chest pain, trouble breathing High blood sugar (hyperglycemia)--increased  thirst or amount of urine, unusual weakness or fatigue, blurry vision Low blood sugar (hypoglycemia)--tremors or shaking, anxiety, sweating, cold or clammy skin, confusion, dizziness, rapid heartbeat Low thyroid levels (hypothyroidism)--unusual weakness or fatigue, increased sensitivity to cold, constipation, hair loss, dry skin, weight gain, feelings of depression Low vitamin B12 level--pain, tingling, or numbness in the hands or feet, muscle weakness, dizziness, confusion, trouble concentrating Pancreatitis--severe stomach pain that spreads to your back or gets worse after eating or when touched, fever, nausea, vomiting Side effects that usually do not require medical attention (report to your care team if they continue or are bothersome): Diarrhea Dizziness Gas Headache Pain, redness, or irritation at injection site Stomach pain This list may not describe all possible side effects. Call your doctor for medical advice about side effects. You may report side effects to FDA at 1-800-FDA-1088. Where should I keep my medication? Keep out of the reach of children and pets. Store in the refrigerator. Protect from light. Allow to come to room temperature naturally. Do not use artificial heat. If protected from light, the injection may be stored between 20 and 30 degrees C (70 and 86 degrees F) for 14 days. After the initial use, throw away any unused portion of a multiple dose vial after 14 days. Get rid of any unused portions of the ampules after use. To get rid of medications that are no longer needed or have expired: Take the medication to a medication take-back program. Ask your pharmacy or law enforcement to find a location. If you cannot return the medication, ask your pharmacist or care team how to get rid of the medication safely. NOTE: This sheet is a summary. It may not cover all possible information. If you have questions about this medicine, talk to your doctor, pharmacist, or health care  provider.  2023 Elsevier/Gold Standard (2021-04-17 00:00:00)   

## 2022-06-09 ENCOUNTER — Encounter: Payer: Self-pay | Admitting: *Deleted

## 2022-06-09 ENCOUNTER — Telehealth: Payer: Self-pay

## 2022-06-09 NOTE — Telephone Encounter (Signed)
Victorino Dike from [facility] called stating patient needs to have 3 teeth extractions and would like for [place] to fax over a form stating it is safe to do so and md is giving dental office clearance to do this procedure. Fax number [phone].

## 2022-06-19 ENCOUNTER — Encounter: Payer: Self-pay | Admitting: Internal Medicine

## 2022-06-25 ENCOUNTER — Ambulatory Visit
Admission: RE | Admit: 2022-06-25 | Discharge: 2022-06-25 | Disposition: A | Discharge status: Home / Self Care | Payer: Medicare Other | Source: Ambulatory Visit | Attending: Internal Medicine | Admitting: Internal Medicine

## 2022-06-25 DIAGNOSIS — C7A8 Other malignant neuroendocrine tumors: Secondary | ICD-10-CM | POA: Diagnosis present

## 2022-06-25 DIAGNOSIS — C7B02 Secondary carcinoid tumors of liver: Secondary | ICD-10-CM | POA: Insufficient documentation

## 2022-06-25 LAB — POCT I-STAT CREATININE: Creatinine, Ser: 1 mg/dL (ref 0.44–1.00)

## 2022-06-25 MED ORDER — IOHEXOL 300 MG/ML  SOLN
100.0000 mL | Freq: Once | INTRAMUSCULAR | Status: AC | PRN
  Administered 2022-06-25: 100 mL via INTRAVENOUS

## 2022-07-02 ENCOUNTER — Encounter: Payer: Self-pay | Admitting: Internal Medicine

## 2022-07-02 ENCOUNTER — Inpatient Hospital Stay: Payer: Medicare Other

## 2022-07-02 ENCOUNTER — Inpatient Hospital Stay (HOSPITAL_BASED_OUTPATIENT_CLINIC_OR_DEPARTMENT_OTHER): Payer: Medicare Other | Admitting: Internal Medicine

## 2022-07-02 ENCOUNTER — Inpatient Hospital Stay: Payer: Medicare Other | Attending: Internal Medicine

## 2022-07-02 VITALS — BP 128/60 | HR 101 | Resp 18 | Ht 65.0 in | Wt 205.0 lb

## 2022-07-02 VITALS — BP 123/59 | HR 59

## 2022-07-02 DIAGNOSIS — E876 Hypokalemia: Secondary | ICD-10-CM | POA: Diagnosis not present

## 2022-07-02 DIAGNOSIS — C7A8 Other malignant neuroendocrine tumors: Secondary | ICD-10-CM

## 2022-07-02 DIAGNOSIS — C7A019 Malignant carcinoid tumor of the small intestine, unspecified portion: Secondary | ICD-10-CM | POA: Diagnosis present

## 2022-07-02 DIAGNOSIS — I4891 Unspecified atrial fibrillation: Secondary | ICD-10-CM | POA: Insufficient documentation

## 2022-07-02 DIAGNOSIS — C7B02 Secondary carcinoid tumors of liver: Secondary | ICD-10-CM

## 2022-07-02 DIAGNOSIS — Z7901 Long term (current) use of anticoagulants: Secondary | ICD-10-CM | POA: Diagnosis not present

## 2022-07-02 DIAGNOSIS — D509 Iron deficiency anemia, unspecified: Secondary | ICD-10-CM | POA: Diagnosis not present

## 2022-07-02 DIAGNOSIS — K6389 Other specified diseases of intestine: Secondary | ICD-10-CM

## 2022-07-02 LAB — CMP (CANCER CENTER ONLY)
ALT: 14 U/L (ref 0–44)
AST: 20 U/L (ref 15–41)
Albumin: 3.5 g/dL (ref 3.5–5.0)
Alkaline Phosphatase: 85 U/L (ref 38–126)
Anion gap: 12 (ref 5–15)
BUN: 17 mg/dL (ref 8–23)
CO2: 20 mmol/L — ABNORMAL LOW (ref 22–32)
Calcium: 8.9 mg/dL (ref 8.9–10.3)
Chloride: 103 mmol/L (ref 98–111)
Creatinine: 0.85 mg/dL (ref 0.44–1.00)
GFR, Estimated: >60 mL/min (ref >60)
Glucose, Bld: 133 mg/dL — ABNORMAL HIGH (ref 70–99)
Potassium: 3.5 mmol/L (ref 3.5–5.1)
Sodium: 135 mmol/L (ref 135–145)
Total Bilirubin: 0.4 mg/dL (ref 0.3–1.2)
Total Protein: 6.7 g/dL (ref 6.5–8.1)

## 2022-07-02 LAB — CBC WITH DIFFERENTIAL (CANCER CENTER ONLY)
Abs Immature Granulocytes: 0.01 10*3/uL (ref 0.00–0.07)
Basophils Absolute: 0 10*3/uL (ref 0.0–0.1)
Basophils Relative: 1 %
Eosinophils Absolute: 0.3 10*3/uL (ref 0.0–0.5)
Eosinophils Relative: 5 %
HCT: 29.6 % — ABNORMAL LOW (ref 36.0–46.0)
Hemoglobin: 9.5 g/dL — ABNORMAL LOW (ref 12.0–15.0)
Immature Granulocytes: 0 %
Lymphocytes Relative: 10 %
Lymphs Abs: 0.6 10*3/uL — ABNORMAL LOW (ref 0.7–4.0)
MCH: 28.4 pg (ref 26.0–34.0)
MCHC: 32.1 g/dL (ref 30.0–36.0)
MCV: 88.6 fL (ref 80.0–100.0)
Monocytes Absolute: 0.8 10*3/uL (ref 0.1–1.0)
Monocytes Relative: 14 %
Neutro Abs: 3.9 10*3/uL (ref 1.7–7.7)
Neutrophils Relative %: 70 %
Platelet Count: 182 10*3/uL (ref 150–400)
RBC: 3.34 MIL/uL — ABNORMAL LOW (ref 3.87–5.11)
RDW: 17.1 % — ABNORMAL HIGH (ref 11.5–15.5)
WBC Count: 5.6 10*3/uL (ref 4.0–10.5)
nRBC: 0 % (ref 0.0–0.2)

## 2022-07-02 LAB — IRON AND TIBC
Iron: 36 ug/dL (ref 28–170)
Saturation Ratios: 9 % — ABNORMAL LOW (ref 10.4–31.8)
TIBC: 398 ug/dL (ref 250–450)
UIBC: 362 ug/dL

## 2022-07-02 LAB — FERRITIN: Ferritin: 36 ng/mL (ref 11–307)

## 2022-07-02 MED ORDER — SODIUM CHLORIDE 0.9 % IV SOLN
Freq: Once | INTRAVENOUS | Status: AC
  Filled 2022-07-02: qty 250

## 2022-07-02 MED ORDER — OCTREOTIDE ACETATE 30 MG IM KIT
30.0000 mg | PACK | Freq: Once | INTRAMUSCULAR | Status: AC
  Administered 2022-07-02: 30 mg via INTRAMUSCULAR

## 2022-07-02 MED ORDER — SODIUM CHLORIDE 0.9 % IV SOLN
200.0000 mg | Freq: Once | INTRAVENOUS | Status: AC
  Administered 2022-07-02: 200 mg via INTRAVENOUS
  Filled 2022-07-02: qty 200

## 2022-07-02 NOTE — Progress Notes (Signed)
[facility] OFFICE PROGRESS NOTE  Patient Care Team: Jerl Mina, [name] Delma Freeze, [name] Marcina Millard, [name] Mick Sell, [name] Earna Coder, [name]   Cancer Staging  No matching staging information was found for the patient.   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   # FEB 2018- Ga PET- uptake in liver/ mesenteric mass; NOV 2019- increase sandostatin to 30mg  q monthly.   #S/p Lutathera x4 every 2 months; last treatment mid September 2022 [Dr.edmunds]; OCT 2022- PET STABLE.   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  In a wheelchair.  Alone.   Emma [name] 82 y.o.  female pleasant patient above history of metastatic carcinoid tumor currently on Sandostatin/; s/p Lutathera infusions every 2 months ; iron deficient anemia question AV malformation; A-fib on Eliquis is here for follow-up/ and review the results of CT scan CAP.  Patient has intermittent right sided lower abd pain. Intermittent nausea. Tries to eat bland foods.   Noted to have chronic looser stools but that is normal for her. Sleeps well.   Complains of ongoing tired, low energy. Ambulates at home with a walker. In wheelchair at clinic    Review of Systems  Constitutional:  Positive for malaise/fatigue. Negative for chills, diaphoresis, fever and weight loss.  HENT:  Negative for nosebleeds and sore throat.   Eyes:  Negative for double vision.  Respiratory:  Negative for hemoptysis, sputum production, shortness of breath and wheezing.   Cardiovascular:  Negative for chest pain, palpitations and orthopnea.  Gastrointestinal:  Positive for abdominal pain and diarrhea. Negative for constipation, heartburn, melena, nausea and vomiting.  Genitourinary:  Negative for dysuria, frequency and urgency.  Musculoskeletal:  Positive for back pain. Negative for joint pain.  Skin: Negative.  Negative for itching and rash.  Neurological:  Negative for dizziness, tingling, focal weakness, weakness and headaches.  Endo/Heme/Allergies:  Does not bruise/bleed easily.  Psychiatric/Behavioral:  Negative for depression. The patient is not nervous/anxious and does not have insomnia.       PAST MEDICAL HISTORY :  Past Medical History:  Diagnosis Date  Aromatase inhibitor use    Atrial fibrillation (HCC)    Breast cancer (HCC) 2014   left breast cancer/radiation   Breast cancer, left breast (HCC) 06/19/2014   CHF (congestive heart failure)  (HCC)    recent sepsis   Chronic kidney disease    Dizziness    Dyspnea    recently while admitted   GERD (gastroesophageal reflux disease)    Hearing loss    History of hiatal hernia    History of kidney stones    HOH (hard of hearing)    severe   Hypertension    Hypothyroidism    Leg DVT (deep venous thromboembolism), acute (HCC) 07/2015   left leg after fracture   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   Pain    chest pain while admitted   Personal history of radiation therapy 2015   left breast ca   Thyroid disease     PAST SURGICAL HISTORY :   Past Surgical History:  Procedure Laterality Date   ABDOMINAL HYSTERECTOMY     partial   BREAST LUMPECTOMY Left 01/13/2013   invasive mammary carcinoma, clear margins, LN negative   BREAST LUMPECTOMY WITH SENTINEL LYMPH NODE BIOPSY Left 2014   CHOLECYSTECTOMY     COLONOSCOPY WITH PROPOFOL N/A 09/17/2017   Procedure: COLONOSCOPY WITH PROPOFOL;  Surgeon: Wyline Mood, MD;  Location[facility] ENDOSCOPY;  Service: Gastroenterology;  Laterality: N/A;   CYSTOSCOPY W/ URETERAL STENT PLACEMENT Right 01/07/2016   Procedure: CYSTOSCOPY WITH STENT REPLACEMENT;  Surgeon: Vanna Scotland, MD;  Location: [facility] ORS;  Service: Urology;  Laterality: Right;   CYSTOSCOPY WITH RETROGRADE PYELOGRAM, URETEROSCOPY AND STENT PLACEMENT Right 12/19/2015   Procedure: CYSTOSCOPY WITH RETROGRADE PYELOGRAM, URETEROSCOPY AND STENT PLACEMENT;  Surgeon: Crist Fat, MD;  Location: [facility] ORS;  Service: Urology;  Laterality: Right;   URETEROSCOPY WITH HOLMIUM LASER LITHOTRIPSY Right 01/07/2016   Procedure: URETEROSCOPY WITH HOLMIUM LASER LITHOTRIPSY;  Surgeon: Vanna Scotland, MD;  Location: [facility] ORS;  Service: Urology;  Laterality: Right;    FAMILY HISTORY :   Family History  Problem Relation Age of Onset   Breast cancer Mother 69       deceased 2   Breast cancer Maternal Aunt 35       deceased 59s   Prostate cancer Father        deceased 68   Colon cancer Paternal  Aunt 88       deceased 55s    SOCIAL HISTORY:   Social History   Tobacco Use   Smoking status: Never   Smokeless tobacco: Never  Vaping Use   Vaping Use: Never used  Substance Use Topics   Alcohol use: No    Alcohol/week: 0.0 standard drinks of alcohol   Drug use: No    ALLERGIES:  is allergic to sulfa antibiotics.  MEDICATIONS:  Current Outpatient Medications  Medication Sig Dispense Refill   apixaban (ELIQUIS) 5 MG TABS tablet Take 1 tablet (5 mg total) by mouth 2 (two) times daily. 60 tablet 0   aspirin EC 81 MG tablet Take 81 mg by mouth daily as needed. Pt reports taking if she feels her heart "flutter"     diltiazem (CARDIZEM CD) 180 MG 24 hr capsule Take 1 capsule (180 mg total) by mouth daily. 30 capsule 0   furosemide (LASIX) 40 MG tablet Take 40 mg by mouth daily.     labetalol (NORMODYNE) 100 MG tablet Take 100 mg by mouth 2 (two) times daily.     levothyroxine (SYNTHROID) 88  MCG tablet Take 88 mcg by mouth daily before breakfast.      losartan-hydrochlorothiazide (HYZAAR) 100-25 MG tablet Take 1 tablet by mouth daily.     nystatin (MYCOSTATIN/NYSTOP) powder APPLY TO AFFECTED AREA TWICE A DAY 15 g 3   potassium chloride SA (KLOR-CON M20) 20 MEQ tablet TAKE 1 TABLET BY MOUTH EVERY DAY 90 tablet 0   TIADYLT ER 180 MG 24 hr capsule Take 180 mg by mouth daily.     traMADol (ULTRAM) 50 MG tablet 0.5 tablet to 1 tablet every 8 hours as needed for pain 30 tablet 0   Vitamin D, Ergocalciferol, (DRISDOL) 1.25 MG (50000 UNIT) CAPS capsule TAKE 1 CAPSULE BY MOUTH ONE TIME PER WEEK 12 capsule 3   pantoprazole (PROTONIX) 40 MG tablet TAKE 1 TABLET BY MOUTH EVERY DAY (Patient not taking: Reported on 01/29/2022) 90 tablet 0   No current facility-administered medications for this visit.   Facility-Administered Medications Ordered in Other Visits  Medication Dose Route Frequency Provider Last Rate Last Admin   octreotide (SANDOSTATIN LAR) 30 MG IM injection            octreotide  (SANDOSTATIN LAR) IM injection 30 mg  30 mg Intramuscular Once Earna Coder, MD        PHYSICAL EXAMINATION: ECOG PERFORMANCE STATUS: 1 - Symptomatic but completely ambulatory  BP 128/60 (BP Location: Right Arm, Patient Position: Sitting)   Pulse (!) 101   Resp 18   Ht 5\' 5"  (1.651 m)   Wt 205 lb (93 kg)   SpO2 98%   BMI 34.11 kg/m   Filed Weights   07/02/22 1421  Weight: 205 lb (93 kg)       Physical Exam HENT:     Head: Normocephalic and atraumatic.     Mouth/Throat:     Pharynx: No oropharyngeal exudate.  Eyes:     Pupils: Pupils are equal, round, and reactive to light.  Cardiovascular:     Rate and Rhythm: Normal rate. Rhythm irregular.  Pulmonary:     Effort: Pulmonary effort is normal. No respiratory distress.     Breath sounds: Normal breath sounds. No wheezing.  Abdominal:     General: Bowel sounds are normal. There is no distension.     Palpations: Abdomen is soft. There is no mass.     Tenderness: There is no abdominal tenderness. There is no guarding or rebound.  Musculoskeletal:        General: Swelling present. No tenderness. Normal range of motion.     Cervical back: Normal range of motion and neck supple.  Skin:    General: Skin is warm.  Neurological:     Mental Status: She is alert and oriented to person, place, and time.  Psychiatric:        Mood and Affect: Affect normal.     LABORATORY DATA:  I have reviewed the data as listed    Component Value Date/Time   NA 135 07/02/2022 1408   NA 137 07/12/2013 0841   K 3.5 07/02/2022 1408   K 4.7 07/12/2013 0841   CL 103 07/02/2022 1408   CL 107 07/12/2013 0841   CO2 20 (L) 07/02/2022 1408   CO2 20 (L) 07/12/2013 0841   GLUCOSE 133 (H) 07/02/2022 1408   GLUCOSE 122 (H) 07/12/2013 0841   BUN 17 07/02/2022 1408   BUN 18 07/12/2013 0841   CREATININE 0.85 07/02/2022 1408   CREATININE 0.64 05/22/2014 1111   CALCIUM 8.9 07/02/2022 1408  CALCIUM 9.2 07/12/2013 0841   PROT 6.7  07/02/2022 1408   PROT 7.0 05/22/2014 1111   ALBUMIN 3.5 07/02/2022 1408   ALBUMIN 4.2 05/22/2014 1111   AST 20 07/02/2022 1408   ALT 14 07/02/2022 1408   ALT 17 05/22/2014 1111   ALKPHOS 85 07/02/2022 1408   ALKPHOS 95 05/22/2014 1111   BILITOT 0.4 07/02/2022 1408   GFRNONAA >60 07/02/2022 1408   GFRNONAA >60 05/22/2014 1111   GFRAA >60 10/07/2019 1319   GFRAA >60 05/22/2014 1111    No results found for: "SPEP", "UPEP"  Lab Results  Component Value Date   WBC 5.6 07/02/2022   NEUTROABS 3.9 07/02/2022   HGB 9.5 (L) 07/02/2022   HCT 29.6 (L) 07/02/2022   MCV 88.6 07/02/2022   PLT 182 07/02/2022      Chemistry      Component Value Date/Time   NA 135 07/02/2022 1408   NA 137 07/12/2013 0841   K 3.5 07/02/2022 1408   K 4.7 07/12/2013 0841   CL 103 07/02/2022 1408   CL 107 07/12/2013 0841   CO2 20 (L) 07/02/2022 1408   CO2 20 (L) 07/12/2013 0841   BUN 17 07/02/2022 1408   BUN 18 07/12/2013 0841   CREATININE 0.85 07/02/2022 1408   CREATININE 0.64 05/22/2014 1111      Component Value Date/Time   CALCIUM 8.9 07/02/2022 1408   CALCIUM 9.2 07/12/2013 0841   ALKPHOS 85 07/02/2022 1408   ALKPHOS 95 05/22/2014 1111   AST 20 07/02/2022 1408   ALT 14 07/02/2022 1408   ALT 17 05/22/2014 1111   BILITOT 0.4 07/02/2022 1408       RADIOGRAPHIC STUDIES: I have personally reviewed the radiological images as listed and agreed with the findings in the report. No results found.   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  JUNE 2024- 3.0 x 5.7 cm calcified mesenteric nodal mass in the right mid abdomen  /mesenteric mass in the root of the small bowel mesentery consistent with known carcinoid tumor;  No findings for recurrent hepatic metastatic disease. No new sites of metastatic disease; No acute abdominal/pelvic findings. Stable.   #Continue Sandostatin q  M.  No obvious progression of disease noticed.  labs today reviewed; see below re: anemia.   # Iron deficient anemia-? AV malformation; SEP 2023-- Iron sat-9%; ferrtin- 20; Hemoglobin  9.5  -proceed with Venofer today.  Declined GI appointment in past.  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   # Hypokalemia-potassium 3.2; secondary to diuretics- -stable.   ## A.fib 2023; March- on eliquis [Dr.Parashoes Cardiologist];  Monitor closely given history of rectal bleeding--stable.   # DISPOSITION- #  Ok with Sandostatin today;Venofer- # in 1months- Sandostatin/venofer # Follow up in 2 months-MD; labs-- cbc/cmp/ chromogranin levels; iron studies;ferritin; b12 -Sandostatin; possible venofer-  Dr.B  # I reviewed the blood work- with the patient in detail; also reviewed the imaging independently [as summarized above]; and with the patient in detail.        No orders of the defined types were placed in this encounter.   All questions were answered. The patient knows to call the clinic with any problems, questions or concerns.      Earna Coder, MD 07/02/2022 2:46 PM

## 2022-07-02 NOTE — Assessment & Plan Note (Addendum)
#   Non- functional Small bowel/mesenteric carcinoid; s/p Lutathera + sandostatin x4 infusions.  JUNE 2024- 3.0 x 5.7 cm calcified mesenteric nodal mass in the right mid abdomen  /mesenteric mass in the root of the small bowel mesentery consistent with known carcinoid tumor;  No findings for recurrent hepatic metastatic disease. No new sites of metastatic disease; No acute abdominal/pelvic findings. Stable.   #Continue Sandostatin q M.  No obvious progression of disease noticed.  labs today reviewed; see below re: anemia.   # Iron deficient anemia-? AV malformation; SEP 2023-- Iron sat-9%; ferrtin- 20; Hemoglobin  9.5  -proceed with Venofer today.  Declined GI appointment in past.  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   # Hypokalemia-potassium 3.2; secondary to diuretics- -stable.   ## A.fib 2023; March- on eliquis [Dr.Parashoes Cardiologist];  Monitor closely given history of rectal bleeding--stable.   # DISPOSITION- #  Ok with Sandostatin today;Venofer- # in 1months- Sandostatin/venofer # Follow up in 2 months-MD; labs-- cbc/cmp/ chromogranin levels; iron studies;ferritin; b12 -Sandostatin; possible venofer-  Dr.B  # I reviewed the blood work- with the patient in detail; also reviewed the imaging independently [as summarized above]; and with the patient in detail.

## 2022-07-02 NOTE — Progress Notes (Signed)
Pt does get right sided lower abd pain. Intermittent nausea. Tries to eat bland foods. Reports looser stools but that is normal for her. Sleeps well. Tired, low energy. Ambulates at home with a walker. In wheelchair at clinic.

## 2022-07-07 LAB — CHROMOGRANIN A: Chromogranin A (ng/mL): 134.6 ng/mL — ABNORMAL HIGH (ref 0.0–101.8)

## 2022-08-01 ENCOUNTER — Inpatient Hospital Stay: Payer: Medicare Other | Attending: Internal Medicine

## 2022-08-01 VITALS — BP 127/48 | HR 54 | Temp 97.2°F | Resp 18

## 2022-08-01 DIAGNOSIS — D509 Iron deficiency anemia, unspecified: Secondary | ICD-10-CM | POA: Insufficient documentation

## 2022-08-01 DIAGNOSIS — E340 Carcinoid syndrome: Secondary | ICD-10-CM | POA: Diagnosis not present

## 2022-08-01 DIAGNOSIS — C7A019 Malignant carcinoid tumor of the small intestine, unspecified portion: Secondary | ICD-10-CM | POA: Diagnosis present

## 2022-08-01 DIAGNOSIS — K6389 Other specified diseases of intestine: Secondary | ICD-10-CM

## 2022-08-01 DIAGNOSIS — C7B02 Secondary carcinoid tumors of liver: Secondary | ICD-10-CM | POA: Insufficient documentation

## 2022-08-01 DIAGNOSIS — C7A8 Other malignant neuroendocrine tumors: Secondary | ICD-10-CM

## 2022-08-01 MED ORDER — SODIUM CHLORIDE 0.9 % IV SOLN
200.0000 mg | Freq: Once | INTRAVENOUS | Status: AC
  Administered 2022-08-01: 200 mg via INTRAVENOUS
  Filled 2022-08-01: qty 200

## 2022-08-01 MED ORDER — OCTREOTIDE ACETATE 30 MG IM KIT
30.0000 mg | PACK | Freq: Once | INTRAMUSCULAR | Status: AC
  Administered 2022-08-01: 30 mg via INTRAMUSCULAR
  Filled 2022-08-01: qty 1

## 2022-08-01 MED ORDER — SODIUM CHLORIDE 0.9 % IV SOLN
Freq: Once | INTRAVENOUS | Status: AC
  Filled 2022-08-01: qty 250

## 2022-08-18 ENCOUNTER — Telehealth: Payer: Self-pay | Admitting: *Deleted

## 2022-08-18 NOTE — Telephone Encounter (Signed)
Daughter Inetta Fermo called reporting that patient is having increasing intensity  of lower abdominal pain every time she eats tot he point that patient does not want to eat because she does not want to hurt. She is asking if medicine can be ordered or what else can be done for her. Please advise

## 2022-08-19 ENCOUNTER — Encounter: Payer: Self-pay | Admitting: Internal Medicine

## 2022-08-19 NOTE — Telephone Encounter (Signed)
Pt offered an [facility] apt today (Tuesday) - Per Britta Mccreedy in sch.,family declined apt today as pt has to "get ready." Smc apt sch wednesday

## 2022-08-19 NOTE — Telephone Encounter (Signed)
Per scheduling, daughter called back to cnl the smc visit for tomorrow.

## 2022-08-20 ENCOUNTER — Encounter: Payer: BLUE CROSS/BLUE SHIELD | Admitting: Medical Oncology

## 2022-08-20 ENCOUNTER — Other Ambulatory Visit: Payer: BLUE CROSS/BLUE SHIELD

## 2022-08-24 ENCOUNTER — Other Ambulatory Visit: Payer: Self-pay | Admitting: Internal Medicine

## 2022-08-25 ENCOUNTER — Encounter: Payer: Self-pay | Admitting: Internal Medicine

## 2022-09-01 ENCOUNTER — Inpatient Hospital Stay: Payer: Medicare Other | Attending: Internal Medicine

## 2022-09-01 ENCOUNTER — Inpatient Hospital Stay: Payer: Medicare Other

## 2022-09-01 ENCOUNTER — Encounter: Payer: Self-pay | Admitting: Internal Medicine

## 2022-09-01 ENCOUNTER — Inpatient Hospital Stay (HOSPITAL_BASED_OUTPATIENT_CLINIC_OR_DEPARTMENT_OTHER): Payer: Medicare Other | Admitting: Internal Medicine

## 2022-09-01 VITALS — BP 135/52 | HR 53 | Temp 97.6°F | Resp 19

## 2022-09-01 DIAGNOSIS — Z7901 Long term (current) use of anticoagulants: Secondary | ICD-10-CM | POA: Diagnosis not present

## 2022-09-01 DIAGNOSIS — I4891 Unspecified atrial fibrillation: Secondary | ICD-10-CM | POA: Diagnosis not present

## 2022-09-01 DIAGNOSIS — Z853 Personal history of malignant neoplasm of breast: Secondary | ICD-10-CM | POA: Insufficient documentation

## 2022-09-01 DIAGNOSIS — E340 Carcinoid syndrome: Secondary | ICD-10-CM | POA: Diagnosis not present

## 2022-09-01 DIAGNOSIS — D509 Iron deficiency anemia, unspecified: Secondary | ICD-10-CM | POA: Insufficient documentation

## 2022-09-01 DIAGNOSIS — E876 Hypokalemia: Secondary | ICD-10-CM | POA: Diagnosis not present

## 2022-09-01 DIAGNOSIS — C7B02 Secondary carcinoid tumors of liver: Secondary | ICD-10-CM | POA: Insufficient documentation

## 2022-09-01 DIAGNOSIS — K6389 Other specified diseases of intestine: Secondary | ICD-10-CM

## 2022-09-01 DIAGNOSIS — C7A019 Malignant carcinoid tumor of the small intestine, unspecified portion: Secondary | ICD-10-CM | POA: Diagnosis present

## 2022-09-01 DIAGNOSIS — C7A8 Other malignant neuroendocrine tumors: Secondary | ICD-10-CM

## 2022-09-01 LAB — CMP (CANCER CENTER ONLY)
ALT: 14 U/L (ref 0–44)
AST: 22 U/L (ref 15–41)
Albumin: 3.6 g/dL (ref 3.5–5.0)
Alkaline Phosphatase: 84 U/L (ref 38–126)
Anion gap: 8 (ref 5–15)
BUN: 14 mg/dL (ref 8–23)
CO2: 24 mmol/L (ref 22–32)
Calcium: 9.1 mg/dL (ref 8.9–10.3)
Chloride: 104 mmol/L (ref 98–111)
Creatinine: 0.92 mg/dL (ref 0.44–1.00)
GFR, Estimated: >60 mL/min (ref >60)
Glucose, Bld: 118 mg/dL — ABNORMAL HIGH (ref 70–99)
Potassium: 3.4 mmol/L — ABNORMAL LOW (ref 3.5–5.1)
Sodium: 136 mmol/L (ref 135–145)
Total Bilirubin: 0.3 mg/dL (ref 0.3–1.2)
Total Protein: 6.4 g/dL — ABNORMAL LOW (ref 6.5–8.1)

## 2022-09-01 LAB — CBC WITH DIFFERENTIAL (CANCER CENTER ONLY)
Abs Immature Granulocytes: 0.02 10*3/uL (ref 0.00–0.07)
Basophils Absolute: 0 10*3/uL (ref 0.0–0.1)
Basophils Relative: 1 %
Eosinophils Absolute: 0.4 10*3/uL (ref 0.0–0.5)
Eosinophils Relative: 7 %
HCT: 28.4 % — ABNORMAL LOW (ref 36.0–46.0)
Hemoglobin: 9.1 g/dL — ABNORMAL LOW (ref 12.0–15.0)
Immature Granulocytes: 0 %
Lymphocytes Relative: 9 %
Lymphs Abs: 0.5 10*3/uL — ABNORMAL LOW (ref 0.7–4.0)
MCH: 28.3 pg (ref 26.0–34.0)
MCHC: 32 g/dL (ref 30.0–36.0)
MCV: 88.2 fL (ref 80.0–100.0)
Monocytes Absolute: 0.8 10*3/uL (ref 0.1–1.0)
Monocytes Relative: 13 %
Neutro Abs: 4.1 10*3/uL (ref 1.7–7.7)
Neutrophils Relative %: 70 %
Platelet Count: 221 10*3/uL (ref 150–400)
RBC: 3.22 MIL/uL — ABNORMAL LOW (ref 3.87–5.11)
RDW: 16.2 % — ABNORMAL HIGH (ref 11.5–15.5)
WBC Count: 5.9 10*3/uL (ref 4.0–10.5)
nRBC: 0 % (ref 0.0–0.2)

## 2022-09-01 LAB — IRON AND TIBC
Iron: 34 ug/dL (ref 28–170)
Saturation Ratios: 8 % — ABNORMAL LOW (ref 10.4–31.8)
TIBC: 416 ug/dL (ref 250–450)
UIBC: 382 ug/dL

## 2022-09-01 LAB — VITAMIN B12: Vitamin B-12: 206 pg/mL (ref 180–914)

## 2022-09-01 LAB — FERRITIN: Ferritin: 19 ng/mL (ref 11–307)

## 2022-09-01 MED ORDER — SODIUM CHLORIDE 0.9 % IV SOLN
200.0000 mg | Freq: Once | INTRAVENOUS | Status: AC
  Administered 2022-09-01: 200 mg via INTRAVENOUS
  Filled 2022-09-01: qty 200

## 2022-09-01 MED ORDER — SODIUM CHLORIDE 0.9 % IV SOLN
Freq: Once | INTRAVENOUS | Status: AC
  Filled 2022-09-01: qty 250

## 2022-09-01 MED ORDER — OCTREOTIDE ACETATE 30 MG IM KIT
30.0000 mg | PACK | Freq: Once | INTRAMUSCULAR | Status: AC
  Administered 2022-09-01: 30 mg via INTRAMUSCULAR
  Filled 2022-09-01: qty 1

## 2022-09-01 NOTE — Assessment & Plan Note (Addendum)
#   Non- functional Small bowel/mesenteric carcinoid; s/p Lutathera + sandostatin x4 infusions.  JUNE 2024- 3.0 x 5.7 cm calcified mesenteric nodal mass in the right mid abdomen  /mesenteric mass in the root of the small bowel mesentery consistent with known carcinoid tumor;  No findings for recurrent hepatic metastatic disease. No new sites of metastatic disease; No acute abdominal/pelvic findings. Stable. Echo March 2023- WNL; NO right sided abnormalities.   #Continue Sandostatin q M.  No obvious progression of disease noticed.  labs today reviewed; see below re: anemia.   # Iron deficient anemia-? AV malformation; JUNE 2024-- Iron sat-9%; ferrtin- 36 Hemoglobin  9.5  -proceed with Venofer today.  Declined GI appointment in past.  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   # Hypokalemia-potassium 3.2; secondary to diuretics- -stable.   ## A.fib 2023; March- on eliquis [Dr.Parashoes Cardiologist];  Monitor closely given history of rectal bleeding--stable.   # DISPOSITION- #  Ok with Sandostatin today;Venofer- # in 2 week Venofer.  # in 1months- Sandostatin/venofer # Follow up in 2 months-MD; labs-- cbc/cmp/ chromogranin levels; iron studies;ferritin; b12 -Sandostatin; possible venofer-  Dr.B

## 2022-09-01 NOTE — Progress Notes (Signed)
[facility] OFFICE PROGRESS NOTE  Patient Care Team: Jerl Mina, [name] Delma Freeze, [name] Marcina Millard, [name] Mick Sell, [name] Earna Coder, [name]   Cancer Staging  No matching staging information was found for the patient.    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   # FEB 2018- Ga PET- uptake in liver/ mesenteric mass; NOV 2019- increase sandostatin to 30mg  q monthly.   #S/p Lutathera x4 every 2 months; last treatment mid September 2022 [Dr.edmunds]; OCT 2022- PET STABLE.   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  In a wheelchair.  Alone.   Emma [name] 82 y.o.  female pleasant patient above history of metastatic carcinoid tumor currently on Sandostatin/; s/p Lutathera infusions every 2 months ; iron deficient anemia question AV malformation; A-fib on Eliquis is here for follow-up.   Patient denies any lower abd pain. Denies any Intermittent nausea.   Noted to have chronic looser stools but that is normal for her.  Complains of ongoing tired, low energy. Ambulates at home with a walker. In wheelchair at clinic    Review of Systems  Constitutional:  Positive for malaise/fatigue. Negative for chills, diaphoresis, fever and weight loss.  HENT:  Negative for nosebleeds and sore throat.   Eyes:  Negative for double vision.  Respiratory:  Negative for hemoptysis, sputum production, shortness of breath and wheezing.   Cardiovascular:  Negative for chest pain, palpitations and orthopnea.  Gastrointestinal:  Positive for abdominal pain and diarrhea. Negative for constipation, heartburn, melena, nausea and vomiting.  Genitourinary:  Negative for dysuria, frequency and urgency.  Musculoskeletal:  Positive for back pain. Negative for joint pain.  Skin: Negative.  Negative for itching and rash.  Neurological:  Negative for dizziness, tingling, focal weakness, weakness and headaches.  Endo/Heme/Allergies:  Does not bruise/bleed easily.  Psychiatric/Behavioral:  Negative for depression. The patient is not nervous/anxious and does not have insomnia.       PAST MEDICAL HISTORY :  Past Medical History:  Diagnosis Date   Aromatase inhibitor use    Atrial fibrillation (HCC)    Breast cancer (HCC) 2014  left breast cancer/radiation   Breast cancer, left breast (HCC) 06/19/2014   CHF (congestive heart failure) (HCC)    recent sepsis   Chronic kidney disease    Dizziness    Dyspnea    recently  while admitted   GERD (gastroesophageal reflux disease)    Hearing loss    History of hiatal hernia    History of kidney stones    HOH (hard of hearing)    severe   Hypertension    Hypothyroidism    Leg DVT (deep venous thromboembolism), acute (HCC) 07/2015   left leg after fracture   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   Pain    chest pain while admitted   Personal history of radiation therapy 2015   left breast ca   Thyroid disease     PAST SURGICAL HISTORY :   Past Surgical History:  Procedure Laterality Date   ABDOMINAL HYSTERECTOMY     partial   BREAST LUMPECTOMY Left 01/13/2013   invasive mammary carcinoma, clear margins, LN negative   BREAST LUMPECTOMY WITH SENTINEL LYMPH NODE BIOPSY Left 2014   CHOLECYSTECTOMY     COLONOSCOPY WITH PROPOFOL N/A 09/17/2017   Procedure: COLONOSCOPY WITH PROPOFOL;  Surgeon: Wyline Mood, MD;  Location: [facility] ENDOSCOPY;  Service: Gastroenterology;  Laterality: N/A;   CYSTOSCOPY W/ URETERAL STENT PLACEMENT Right 01/07/2016   Procedure: CYSTOSCOPY WITH STENT REPLACEMENT;  Surgeon: Vanna Scotland, MD;  Location: [facility] ORS;  Service: Urology;  Laterality: Right;   CYSTOSCOPY WITH RETROGRADE PYELOGRAM, URETEROSCOPY AND STENT PLACEMENT Right 12/19/2015   Procedure: CYSTOSCOPY WITH RETROGRADE PYELOGRAM, URETEROSCOPY AND STENT PLACEMENT;  Surgeon: Crist Fat, MD;  Location: [facility] ORS;  Service: Urology;  Laterality: Right;   URETEROSCOPY WITH HOLMIUM LASER LITHOTRIPSY Right 01/07/2016   Procedure: URETEROSCOPY WITH HOLMIUM LASER LITHOTRIPSY;  Surgeon: Vanna Scotland, MD;  Location: [facility] ORS;  Service: Urology;  Laterality: Right;    FAMILY HISTORY :   Family History  Problem Relation Age of Onset   Breast cancer Mother 70       deceased 55   Breast cancer Maternal Aunt 31       deceased 5s   Prostate cancer Father        deceased 25   Colon cancer Paternal Aunt 32       deceased 85s    SOCIAL HISTORY:   Social History   Tobacco Use    Smoking status: Never   Smokeless tobacco: Never  Vaping Use   Vaping status: Never Used  Substance Use Topics   Alcohol use: No    Alcohol/week: 0.0 standard drinks of alcohol   Drug use: No    ALLERGIES:  is allergic to sulfa antibiotics.  MEDICATIONS:  Current Outpatient Medications  Medication Sig Dispense Refill   apixaban (ELIQUIS) 5 MG TABS tablet Take 1 tablet (5 mg total) by mouth 2 (two) times daily. 60 tablet 0   diltiazem (CARDIZEM CD) 180 MG 24 hr capsule Take 1 capsule (180 mg total) by mouth daily. 30 capsule 0   furosemide (LASIX) 40 MG tablet Take 40 mg by mouth daily.     labetalol (NORMODYNE) 100 MG tablet Take 100 mg by mouth 2 (two) times daily.     levothyroxine (SYNTHROID) 88 MCG tablet Take 88 mcg by mouth daily before breakfast.      losartan-hydrochlorothiazide (HYZAAR) 100-25 MG tablet Take 1 tablet by mouth daily.     nystatin (MYCOSTATIN/NYSTOP) powder APPLY TO AFFECTED AREA TWICE A DAY 15 g 3  pantoprazole (PROTONIX) 40 MG tablet TAKE 1 TABLET BY MOUTH EVERY DAY 90 tablet 0   potassium chloride SA (KLOR-CON M20) 20 MEQ tablet TAKE 1 TABLET BY MOUTH EVERY DAY 90 tablet 0   TIADYLT ER 180 MG 24 hr capsule Take 180 mg by mouth daily.     traMADol (ULTRAM) 50 MG tablet 0.5 tablet to 1 tablet every 8 hours as needed for pain 30 tablet 0   Vitamin D, Ergocalciferol, (DRISDOL) 1.25 MG (50000 UNIT) CAPS capsule TAKE 1 CAPSULE BY MOUTH ONE TIME PER WEEK 12 capsule 3   aspirin EC 81 MG tablet Take 81 mg by mouth daily as needed. Pt reports taking if she feels her heart "flutter" (Patient not taking: Reported on 09/01/2022)     No current facility-administered medications for this visit.   Facility-Administered Medications Ordered in Other Visits  Medication Dose Route Frequency Provider Last Rate Last Admin   octreotide (SANDOSTATIN LAR) 30 MG IM injection             PHYSICAL EXAMINATION: ECOG PERFORMANCE STATUS: 1 - Symptomatic but completely ambulatory  BP  (!) 132/52   Pulse (!) 58   Temp 98 F (36.7 C)   Wt 208 lb 4.8 oz (94.5 kg)   SpO2 95%   BMI 34.66 kg/m   Filed Weights   09/01/22 1304  Weight: 208 lb 4.8 oz (94.5 kg)        Physical Exam HENT:     Head: Normocephalic and atraumatic.     Mouth/Throat:     Pharynx: No oropharyngeal exudate.  Eyes:     Pupils: Pupils are equal, round, and reactive to light.  Cardiovascular:     Rate and Rhythm: Normal rate. Rhythm irregular.  Pulmonary:     Effort: Pulmonary effort is normal. No respiratory distress.     Breath sounds: Normal breath sounds. No wheezing.  Abdominal:     General: Bowel sounds are normal. There is no distension.     Palpations: Abdomen is soft. There is no mass.     Tenderness: There is no abdominal tenderness. There is no guarding or rebound.  Musculoskeletal:        General: Swelling present. No tenderness. Normal range of motion.     Cervical back: Normal range of motion and neck supple.  Skin:    General: Skin is warm.  Neurological:     Mental Status: She is alert and oriented to person, place, and time.  Psychiatric:        Mood and Affect: Affect normal.     LABORATORY DATA:  I have reviewed the data as listed    Component Value Date/Time   NA 136 09/01/2022 1303   NA 137 07/12/2013 0841   K 3.4 (L) 09/01/2022 1303   K 4.7 07/12/2013 0841   CL 104 09/01/2022 1303   CL 107 07/12/2013 0841   CO2 24 09/01/2022 1303   CO2 20 (L) 07/12/2013 0841   GLUCOSE 118 (H) 09/01/2022 1303   GLUCOSE 122 (H) 07/12/2013 0841   BUN 14 09/01/2022 1303   BUN 18 07/12/2013 0841   CREATININE 0.92 09/01/2022 1303   CREATININE 0.64 05/22/2014 1111   CALCIUM 9.1 09/01/2022 1303   CALCIUM 9.2 07/12/2013 0841   PROT 6.4 (L) 09/01/2022 1303   PROT 7.0 05/22/2014 1111   ALBUMIN 3.6 09/01/2022 1303   ALBUMIN 4.2 05/22/2014 1111   AST 22 09/01/2022 1303   ALT 14 09/01/2022 1303   ALT 17  05/22/2014 1111   ALKPHOS 84 09/01/2022 1303   ALKPHOS 95  05/22/2014 1111   BILITOT 0.3 09/01/2022 1303   GFRNONAA >60 09/01/2022 1303   GFRNONAA >60 05/22/2014 1111   GFRAA >60 10/07/2019 1319   GFRAA >60 05/22/2014 1111    No results found for: "SPEP", "UPEP"  Lab Results  Component Value Date   WBC 5.9 09/01/2022   NEUTROABS 4.1 09/01/2022   HGB 9.1 (L) 09/01/2022   HCT 28.4 (L) 09/01/2022   MCV 88.2 09/01/2022   PLT 221 09/01/2022      Chemistry      Component Value Date/Time   NA 136 09/01/2022 1303   NA 137 07/12/2013 0841   K 3.4 (L) 09/01/2022 1303   K 4.7 07/12/2013 0841   CL 104 09/01/2022 1303   CL 107 07/12/2013 0841   CO2 24 09/01/2022 1303   CO2 20 (L) 07/12/2013 0841   BUN 14 09/01/2022 1303   BUN 18 07/12/2013 0841   CREATININE 0.92 09/01/2022 1303   CREATININE 0.64 05/22/2014 1111      Component Value Date/Time   CALCIUM 9.1 09/01/2022 1303   CALCIUM 9.2 07/12/2013 0841   ALKPHOS 84 09/01/2022 1303   ALKPHOS 95 05/22/2014 1111   AST 22 09/01/2022 1303   ALT 14 09/01/2022 1303   ALT 17 05/22/2014 1111   BILITOT 0.3 09/01/2022 1303       RADIOGRAPHIC STUDIES: I have personally reviewed the radiological images as listed and agreed with the findings in the report. No results found.   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  JUNE 2024- 3.0 x 5.7 cm calcified mesenteric nodal mass in the right mid abdomen  /mesenteric mass in the root of the small bowel mesentery consistent with known carcinoid tumor;  No findings for recurrent hepatic metastatic disease. No new sites of metastatic disease; No acute abdominal/pelvic findings. Stable. Echo March 2023- WNL; NO right sided abnormalities.   #Continue Sandostatin q M.  No obvious progression of disease noticed.  labs today reviewed; see below re: anemia.   # Iron deficient anemia-? AV malformation; JUNE 2024-- Iron sat-9%; ferrtin- 36  Hemoglobin  9.5  -proceed with Venofer today.  Declined GI appointment in past.  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   # Hypokalemia-potassium 3.2; secondary to diuretics- -stable.   ## A.fib 2023; March- on eliquis [Dr.Parashoes Cardiologist];  Monitor closely given history of rectal bleeding--stable.   # DISPOSITION- #  Ok with Sandostatin today;Venofer- # in 2 week Venofer.  # in 1months- Sandostatin/venofer # Follow up in 2 months-MD; labs-- cbc/cmp/ chromogranin levels; iron studies;ferritin; b12 -Sandostatin; possible venofer-  Dr.B        No orders of the defined types were placed in this encounter.   All questions were answered. The patient knows to call the clinic with any problems, questions or concerns.      Earna Coder, MD 09/01/2022 1:39 PM

## 2022-09-01 NOTE — Progress Notes (Signed)
Fatigue/weakness: no Dyspena: no Light headedness: no Blood in stool:  no 

## 2022-09-15 ENCOUNTER — Inpatient Hospital Stay: Payer: Medicare Other

## 2022-09-15 VITALS — BP 115/50 | HR 51 | Temp 98.1°F

## 2022-09-15 DIAGNOSIS — K6389 Other specified diseases of intestine: Secondary | ICD-10-CM

## 2022-09-15 DIAGNOSIS — C7A8 Other malignant neuroendocrine tumors: Secondary | ICD-10-CM

## 2022-09-15 DIAGNOSIS — C7A019 Malignant carcinoid tumor of the small intestine, unspecified portion: Secondary | ICD-10-CM | POA: Diagnosis not present

## 2022-09-15 MED ORDER — SODIUM CHLORIDE 0.9 % IV SOLN
200.0000 mg | Freq: Once | INTRAVENOUS | Status: AC
  Administered 2022-09-15: 200 mg via INTRAVENOUS
  Filled 2022-09-15: qty 200

## 2022-09-15 MED ORDER — SODIUM CHLORIDE 0.9 % IV SOLN
Freq: Once | INTRAVENOUS | Status: AC
  Filled 2022-09-15: qty 250

## 2022-09-15 NOTE — Patient Instructions (Signed)
 Iron Sucrose Injection What is this medication? IRON SUCROSE (EYE ern SOO krose) treats low levels of iron (iron deficiency anemia) in people with kidney disease. Iron is a mineral that plays an important role in making red blood cells, which carry oxygen from your lungs to the rest of your body. This medicine may be used for other purposes; ask your health care provider or pharmacist if you have questions. COMMON BRAND NAME(S): Venofer What should I tell my care team before I take this medication? They need to know if you have any of these conditions: Anemia not caused by low iron levels Heart disease High levels of iron in the blood Kidney disease Liver disease An unusual or allergic reaction to iron, other medications, foods, dyes, or preservatives Pregnant or trying to get pregnant Breastfeeding How should I use this medication? This medication is for infusion into a vein. It is given in a hospital or clinic setting. Talk to your care team about the use of this medication in children. While this medication may be prescribed for children as young as 2 years for selected conditions, precautions do apply. Overdosage: If you think you have taken too much of this medicine contact a poison control center or emergency room at once. NOTE: This medicine is only for you. Do not share this medicine with others. What if I miss a dose? Keep appointments for follow-up doses. It is important not to miss your dose. Call your care team if you are unable to keep an appointment. What may interact with this medication? Do not take this medication with any of the following: Deferoxamine Dimercaprol Other iron products This medication may also interact with the following: Chloramphenicol Deferasirox This list may not describe all possible interactions. Give your health care provider a list of all the medicines, herbs, non-prescription drugs, or dietary supplements you use. Also tell them if you smoke,  drink alcohol, or use illegal drugs. Some items may interact with your medicine. What should I watch for while using this medication? Visit your care team regularly. Tell your care team if your symptoms do not start to get better or if they get worse. You may need blood work done while you are taking this medication. You may need to follow a special diet. Talk to your care team. Foods that contain iron include: whole grains/cereals, dried fruits, beans, or peas, leafy green vegetables, and organ meats (liver, kidney). What side effects may I notice from receiving this medication? Side effects that you should report to your care team as soon as possible: Allergic reactions--skin rash, itching, hives, swelling of the face, lips, tongue, or throat Low blood pressure--dizziness, feeling faint or lightheaded, blurry vision Shortness of breath Side effects that usually do not require medical attention (report to your care team if they continue or are bothersome): Flushing Headache Joint pain Muscle pain Nausea Pain, redness, or irritation at injection site This list may not describe all possible side effects. Call your doctor for medical advice about side effects. You may report side effects to FDA at 1-800-FDA-1088. Where should I keep my medication? This medication is given in a hospital or clinic. It will not be stored at home. NOTE: This sheet is a summary. It may not cover all possible information. If you have questions about this medicine, talk to your doctor, pharmacist, or health care provider.  2024 Elsevier/Gold Standard (2022-06-20 00:00:00)

## 2022-10-02 ENCOUNTER — Inpatient Hospital Stay: Payer: Medicare Other | Attending: Internal Medicine

## 2022-10-02 VITALS — BP 144/54 | HR 60 | Temp 97.7°F | Resp 18

## 2022-10-02 DIAGNOSIS — K6389 Other specified diseases of intestine: Secondary | ICD-10-CM

## 2022-10-02 DIAGNOSIS — I4891 Unspecified atrial fibrillation: Secondary | ICD-10-CM | POA: Insufficient documentation

## 2022-10-02 DIAGNOSIS — Z7982 Long term (current) use of aspirin: Secondary | ICD-10-CM | POA: Diagnosis not present

## 2022-10-02 DIAGNOSIS — Z86718 Personal history of other venous thrombosis and embolism: Secondary | ICD-10-CM | POA: Insufficient documentation

## 2022-10-02 DIAGNOSIS — C7B02 Secondary carcinoid tumors of liver: Secondary | ICD-10-CM | POA: Diagnosis present

## 2022-10-02 DIAGNOSIS — C7A019 Malignant carcinoid tumor of the small intestine, unspecified portion: Secondary | ICD-10-CM | POA: Diagnosis present

## 2022-10-02 DIAGNOSIS — D509 Iron deficiency anemia, unspecified: Secondary | ICD-10-CM | POA: Insufficient documentation

## 2022-10-02 DIAGNOSIS — C7A8 Other malignant neuroendocrine tumors: Secondary | ICD-10-CM

## 2022-10-02 DIAGNOSIS — Z7901 Long term (current) use of anticoagulants: Secondary | ICD-10-CM | POA: Diagnosis not present

## 2022-10-02 DIAGNOSIS — E876 Hypokalemia: Secondary | ICD-10-CM | POA: Diagnosis not present

## 2022-10-02 MED ORDER — OCTREOTIDE ACETATE 30 MG IM KIT
30.0000 mg | PACK | Freq: Once | INTRAMUSCULAR | Status: AC
  Administered 2022-10-02: 30 mg via INTRAMUSCULAR
  Filled 2022-10-02: qty 1

## 2022-10-02 MED ORDER — SODIUM CHLORIDE 0.9 % IV SOLN
200.0000 mg | Freq: Once | INTRAVENOUS | Status: AC
  Administered 2022-10-02: 200 mg via INTRAVENOUS
  Filled 2022-10-02: qty 200

## 2022-10-02 MED ORDER — SODIUM CHLORIDE 0.9 % IV SOLN
Freq: Once | INTRAVENOUS | Status: AC
  Filled 2022-10-02: qty 250

## 2022-10-29 ENCOUNTER — Inpatient Hospital Stay: Payer: Medicare Other | Attending: Internal Medicine

## 2022-10-29 ENCOUNTER — Encounter: Payer: Self-pay | Admitting: Internal Medicine

## 2022-10-29 ENCOUNTER — Inpatient Hospital Stay (HOSPITAL_BASED_OUTPATIENT_CLINIC_OR_DEPARTMENT_OTHER): Payer: Medicare Other | Admitting: Internal Medicine

## 2022-10-29 ENCOUNTER — Inpatient Hospital Stay: Payer: Medicare Other

## 2022-10-29 VITALS — BP 115/68 | HR 62 | Temp 98.0°F | Resp 18 | Wt 209.0 lb

## 2022-10-29 DIAGNOSIS — C7A019 Malignant carcinoid tumor of the small intestine, unspecified portion: Secondary | ICD-10-CM | POA: Insufficient documentation

## 2022-10-29 DIAGNOSIS — C7B02 Secondary carcinoid tumors of liver: Secondary | ICD-10-CM

## 2022-10-29 DIAGNOSIS — D509 Iron deficiency anemia, unspecified: Secondary | ICD-10-CM | POA: Diagnosis not present

## 2022-10-29 DIAGNOSIS — Z1231 Encounter for screening mammogram for malignant neoplasm of breast: Secondary | ICD-10-CM

## 2022-10-29 DIAGNOSIS — I4891 Unspecified atrial fibrillation: Secondary | ICD-10-CM | POA: Insufficient documentation

## 2022-10-29 DIAGNOSIS — E876 Hypokalemia: Secondary | ICD-10-CM | POA: Diagnosis not present

## 2022-10-29 DIAGNOSIS — Z7901 Long term (current) use of anticoagulants: Secondary | ICD-10-CM | POA: Diagnosis not present

## 2022-10-29 DIAGNOSIS — C7A8 Other malignant neuroendocrine tumors: Secondary | ICD-10-CM

## 2022-10-29 DIAGNOSIS — K6389 Other specified diseases of intestine: Secondary | ICD-10-CM

## 2022-10-29 LAB — CMP (CANCER CENTER ONLY)
ALT: 14 U/L (ref 0–44)
AST: 21 U/L (ref 15–41)
Albumin: 3.6 g/dL (ref 3.5–5.0)
Alkaline Phosphatase: 81 U/L (ref 38–126)
Anion gap: 9 (ref 5–15)
BUN: 12 mg/dL (ref 8–23)
CO2: 24 mmol/L (ref 22–32)
Calcium: 9 mg/dL (ref 8.9–10.3)
Chloride: 104 mmol/L (ref 98–111)
Creatinine: 0.83 mg/dL (ref 0.44–1.00)
GFR, Estimated: >60 mL/min (ref >60)
Glucose, Bld: 129 mg/dL — ABNORMAL HIGH (ref 70–99)
Potassium: 3.3 mmol/L — ABNORMAL LOW (ref 3.5–5.1)
Sodium: 137 mmol/L (ref 135–145)
Total Bilirubin: 0.7 mg/dL (ref 0.3–1.2)
Total Protein: 6.6 g/dL (ref 6.5–8.1)

## 2022-10-29 LAB — CBC WITH DIFFERENTIAL (CANCER CENTER ONLY)
Abs Immature Granulocytes: 0.02 10*3/uL (ref 0.00–0.07)
Basophils Absolute: 0.1 10*3/uL (ref 0.0–0.1)
Basophils Relative: 1 %
Eosinophils Absolute: 0.4 10*3/uL (ref 0.0–0.5)
Eosinophils Relative: 8 %
HCT: 32.9 % — ABNORMAL LOW (ref 36.0–46.0)
Hemoglobin: 10.4 g/dL — ABNORMAL LOW (ref 12.0–15.0)
Immature Granulocytes: 0 %
Lymphocytes Relative: 8 %
Lymphs Abs: 0.5 10*3/uL — ABNORMAL LOW (ref 0.7–4.0)
MCH: 28.7 pg (ref 26.0–34.0)
MCHC: 31.6 g/dL (ref 30.0–36.0)
MCV: 90.6 fL (ref 80.0–100.0)
Monocytes Absolute: 0.8 10*3/uL (ref 0.1–1.0)
Monocytes Relative: 14 %
Neutro Abs: 4.1 10*3/uL (ref 1.7–7.7)
Neutrophils Relative %: 69 %
Platelet Count: 224 10*3/uL (ref 150–400)
RBC: 3.63 MIL/uL — ABNORMAL LOW (ref 3.87–5.11)
RDW: 17 % — ABNORMAL HIGH (ref 11.5–15.5)
WBC Count: 5.9 10*3/uL (ref 4.0–10.5)
nRBC: 0 % (ref 0.0–0.2)

## 2022-10-29 LAB — IRON AND TIBC
Iron: 58 ug/dL (ref 28–170)
Saturation Ratios: 14 % (ref 10.4–31.8)
TIBC: 410 ug/dL (ref 250–450)
UIBC: 352 ug/dL

## 2022-10-29 LAB — VITAMIN B12: Vitamin B-12: 146 pg/mL — ABNORMAL LOW (ref 180–914)

## 2022-10-29 LAB — FERRITIN: Ferritin: 36 ng/mL (ref 11–307)

## 2022-10-29 MED ORDER — SODIUM CHLORIDE 0.9 % IV SOLN
INTRAVENOUS | Status: DC
  Filled 2022-10-29: qty 250

## 2022-10-29 MED ORDER — SODIUM CHLORIDE 0.9 % IV SOLN
Freq: Once | INTRAVENOUS | Status: DC
  Filled 2022-10-29: qty 250

## 2022-10-29 MED ORDER — OCTREOTIDE ACETATE 30 MG IM KIT
30.0000 mg | PACK | Freq: Once | INTRAMUSCULAR | Status: AC
  Administered 2022-10-29: 30 mg via INTRAMUSCULAR
  Filled 2022-10-29: qty 1

## 2022-10-29 MED ORDER — SODIUM CHLORIDE 0.9 % IV SOLN
200.0000 mg | Freq: Once | INTRAVENOUS | Status: AC
  Administered 2022-10-29: 200 mg via INTRAVENOUS
  Filled 2022-10-29: qty 200

## 2022-10-29 NOTE — Progress Notes (Signed)
[facility] OFFICE PROGRESS NOTE  Patient Care Team: Jerl Mina, [name] Delma Freeze, [name] Marcina Millard, [name] Mick Sell, [name] Earna Coder, [name]   Cancer Staging  No matching staging information was found for the patient.    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   # FEB 2018- Ga PET- uptake in liver/ mesenteric mass; NOV 2019- increase sandostatin to 30mg  q monthly.   #S/p Lutathera x4 every 2 months; last treatment mid September 2022 [Dr.edmunds]; OCT 2022- PET STABLE.   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  In a wheelchair.  Alone.   Emma [name] 82 y.o.  female pleasant patient above history of metastatic carcinoid tumor currently on Sandostatin/; s/p Lutathera infusions every 2 months ; iron deficient anemia question AV malformation; A-fib on Eliquis is here for follow-up.   Patient doing okay. Has generalized pain that moves around. Has fatigue, energy is low. Denies any dizziness. Has chronic diarrhea, no better , no worse. Appetite is fair,   Ambulates at home with a walker. In wheelchair at clinic    Review of Systems  Constitutional:  Positive for malaise/fatigue. Negative for chills, diaphoresis, fever and weight loss.  HENT:  Negative for nosebleeds and sore throat.   Eyes:  Negative for double vision.  Respiratory:  Negative for hemoptysis, sputum production, shortness of breath and wheezing.   Cardiovascular:  Negative for chest pain, palpitations and orthopnea.  Gastrointestinal:  Positive for abdominal pain and diarrhea. Negative for constipation, heartburn, melena, nausea and vomiting.  Genitourinary:  Negative for dysuria, frequency and urgency.  Musculoskeletal:  Positive for back pain. Negative for joint pain.  Skin: Negative.  Negative for itching and rash.  Neurological:  Negative for dizziness, tingling, focal weakness, weakness and headaches.  Endo/Heme/Allergies:  Does not bruise/bleed easily.  Psychiatric/Behavioral:  Negative for depression. The patient is not nervous/anxious and does not have insomnia.       PAST MEDICAL HISTORY :  Past Medical History:  Diagnosis Date   Aromatase inhibitor use    Atrial fibrillation (HCC)    Breast cancer (HCC) 2014  left breast cancer/radiation   Breast cancer, left breast (HCC) 06/19/2014   CHF (congestive heart failure) (HCC)    recent sepsis   Chronic kidney disease    Dizziness    Dyspnea    recently  while admitted   GERD (gastroesophageal reflux disease)    Hearing loss    History of hiatal hernia    History of kidney stones    HOH (hard of hearing)    severe   Hypertension    Hypothyroidism    Leg DVT (deep venous thromboembolism), acute (HCC) 07/2015   left leg after fracture   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   Pain    chest pain while admitted   Personal history of radiation therapy 2015   left breast ca   Thyroid disease     PAST SURGICAL HISTORY :   Past Surgical History:  Procedure Laterality Date   ABDOMINAL HYSTERECTOMY     partial   BREAST LUMPECTOMY Left 01/13/2013   invasive mammary carcinoma, clear margins, LN negative   BREAST LUMPECTOMY WITH SENTINEL LYMPH NODE BIOPSY Left 2014   CHOLECYSTECTOMY     COLONOSCOPY WITH PROPOFOL N/A 09/17/2017   Procedure: COLONOSCOPY WITH PROPOFOL;  Surgeon: Wyline Mood, MD;  Location[facility] END[facility]COPY;  Service: Gastroenterology;  Laterality: N/A;   CYSTOSCOPY W/ URETERAL STENT PLACEMENT Right 01/07/2016   Procedure: CYSTOSCOPY WITH STENT REPLACEMENT;  Surgeon: Vanna Scotland, MD;  Location: [facility] ORS;  Service: Urology;  Laterality: Right;   CYSTOSCOPY WITH RETROGRADE PYELOGRAM, URETEROSCOPY AND STENT PLACEMENT Right 12/19/2015   Procedure: CYSTOSCOPY WITH RETROGRADE PYELOGRAM, URETEROSCOPY AND STENT PLACEMENT;  Surgeon: Crist Fat, MD;  Location: [facility] ORS;  Service: Urology;  Laterality: Right;   URETEROSCOPY WITH HOLMIUM LASER LITHOTRIPSY Right 01/07/2016   Procedure: URETEROSCOPY WITH HOLMIUM LASER LITHOTRIPSY;  Surgeon: Vanna Scotland, MD;  Location: [facility] ORS;  Service: Urology;  Laterality: Right;    FAMILY HISTORY :   Family History  Problem Relation Age of Onset   Breast cancer Mother 46       deceased 27   Breast cancer Maternal Aunt 76       deceased 57s   Prostate cancer Father        deceased 42   Colon cancer Paternal Aunt 46       deceased 71s    SOCIAL HISTORY:   Social History   Tobacco Use    Smoking status: Never   Smokeless tobacco: Never  Vaping Use   Vaping status: Never Used  Substance Use Topics   Alcohol use: No    Alcohol/week: 0.0 standard drinks of alcohol   Drug use: No    ALLERGIES:  is allergic to sulfa antibiotics.  MEDICATIONS:  Current Outpatient Medications  Medication Sig Dispense Refill   apixaban (ELIQUIS) 5 MG TABS tablet Take 1 tablet (5 mg total) by mouth 2 (two) times daily. 60 tablet 0   aspirin EC 81 MG tablet Take 81 mg by mouth daily as needed. Pt reports taking if she feels her heart "flutter"     diltiazem (CARDIZEM CD) 180 MG 24 hr capsule Take 1 capsule (180 mg total) by mouth daily. 30 capsule 0   furosemide (LASIX) 40 MG tablet Take 40 mg by mouth daily.     labetalol (NORMODYNE) 100 MG tablet Take 100 mg by mouth 2 (two) times daily.     levothyroxine (SYNTHROID) 88 MCG tablet Take 88 mcg by mouth daily before breakfast.      losartan-hydrochlorothiazide (HYZAAR) 100-25  MG tablet Take 1 tablet by mouth daily.     nystatin (MYCOSTATIN/NYSTOP) powder APPLY TO AFFECTED AREA TWICE A DAY 15 g 3   pantoprazole (PROTONIX) 40 MG tablet TAKE 1 TABLET BY MOUTH EVERY DAY 90 tablet 0   potassium chloride SA (KLOR-CON M20) 20 MEQ tablet TAKE 1 TABLET BY MOUTH EVERY DAY 90 tablet 0   TIADYLT ER 180 MG 24 hr capsule Take 180 mg by mouth daily.     traMADol (ULTRAM) 50 MG tablet 0.5 tablet to 1 tablet every 8 hours as needed for pain 30 tablet 0   Vitamin D, Ergocalciferol, (DRISDOL) 1.25 MG (50000 UNIT) CAPS capsule TAKE 1 CAPSULE BY MOUTH ONE TIME PER WEEK 12 capsule 3   No current facility-administered medications for this visit.   Facility-Administered Medications Ordered in Other Visits  Medication Dose Route Frequency Provider Last Rate Last Admin   0.9 %  sodium chloride infusion   Intravenous Once Louretta Shorten R, MD       0.9 %  sodium chloride infusion   Intravenous Continuous Earna Coder, MD 20 mL/hr at 10/29/22 1416 New Bag  at 10/29/22 1416   iron sucrose (VENOFER) 200 mg in sodium chloride 0.9 % 100 mL IVPB  200 mg Intravenous Once Alinda Dooms, NP       octreotide (SANDOSTATIN LAR) 30 MG IM injection             PHYSICAL EXAMINATION: ECOG PERFORMANCE STATUS: 1 - Symptomatic but completely ambulatory  There were no vitals taken for this visit.  There were no vitals filed for this visit.       Physical Exam HENT:     Head: Normocephalic and atraumatic.     Mouth/Throat:     Pharynx: No oropharyngeal exudate.  Eyes:     Pupils: Pupils are equal, round, and reactive to light.  Cardiovascular:     Rate and Rhythm: Normal rate. Rhythm irregular.  Pulmonary:     Effort: Pulmonary effort is normal. No respiratory distress.     Breath sounds: Normal breath sounds. No wheezing.  Abdominal:     General: Bowel sounds are normal. There is no distension.     Palpations: Abdomen is soft. There is no mass.     Tenderness: There is no abdominal tenderness. There is no guarding or rebound.  Musculoskeletal:        General: Swelling present. No tenderness. Normal range of motion.     Cervical back: Normal range of motion and neck supple.  Skin:    General: Skin is warm.  Neurological:     Mental Status: She is alert and oriented to person, place, and time.  Psychiatric:        Mood and Affect: Affect normal.     LABORATORY DATA:  I have reviewed the data as listed    Component Value Date/Time   NA 137 10/29/2022 1307   NA 137 07/12/2013 0841   K 3.3 (L) 10/29/2022 1307   K 4.7 07/12/2013 0841   CL 104 10/29/2022 1307   CL 107 07/12/2013 0841   CO2 24 10/29/2022 1307   CO2 20 (L) 07/12/2013 0841   GLUCOSE 129 (H) 10/29/2022 1307   GLUCOSE 122 (H) 07/12/2013 0841   BUN 12 10/29/2022 1307   BUN 18 07/12/2013 0841   CREATININE 0.83 10/29/2022 1307   CREATININE 0.64 05/22/2014 1111   CALCIUM 9.0 10/29/2022 1307   CALCIUM 9.2 07/12/2013 0841   PROT 6.6 10/29/2022  1307   PROT 7.0 05/22/2014  1111   ALBUMIN 3.6 10/29/2022 1307   ALBUMIN 4.2 05/22/2014 1111   AST 21 10/29/2022 1307   ALT 14 10/29/2022 1307   ALT 17 05/22/2014 1111   ALKPHOS 81 10/29/2022 1307   ALKPHOS 95 05/22/2014 1111   BILITOT 0.7 10/29/2022 1307   GFRNONAA >60 10/29/2022 1307   GFRNONAA >60 05/22/2014 1111   GFRAA >60 10/07/2019 1319   GFRAA >60 05/22/2014 1111    No results found for: "SPEP", "UPEP"  Lab Results  Component Value Date   WBC 5.9 10/29/2022   NEUTROABS 4.1 10/29/2022   HGB 10.4 (L) 10/29/2022   HCT 32.9 (L) 10/29/2022   MCV 90.6 10/29/2022   PLT 224 10/29/2022      Chemistry      Component Value Date/Time   NA 137 10/29/2022 1307   NA 137 07/12/2013 0841   K 3.3 (L) 10/29/2022 1307   K 4.7 07/12/2013 0841   CL 104 10/29/2022 1307   CL 107 07/12/2013 0841   CO2 24 10/29/2022 1307   CO2 20 (L) 07/12/2013 0841   BUN 12 10/29/2022 1307   BUN 18 07/12/2013 0841   CREATININE 0.83 10/29/2022 1307   CREATININE 0.64 05/22/2014 1111      Component Value Date/Time   CALCIUM 9.0 10/29/2022 1307   CALCIUM 9.2 07/12/2013 0841   ALKPHOS 81 10/29/2022 1307   ALKPHOS 95 05/22/2014 1111   AST 21 10/29/2022 1307   ALT 14 10/29/2022 1307   ALT 17 05/22/2014 1111   BILITOT 0.7 10/29/2022 1307       RADIOGRAPHIC STUDIES: I have personally reviewed the radiological images as listed and agreed with the findings in the report. No results found.   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  JUNE 2024- CT AP-3.0 x 5.7 cm calcified mesenteric nodal mass in the right mid abdomen  /mesenteric mass in the root of the small bowel mesentery consistent with known carcinoid tumor;  No findings for recurrent hepatic metastatic disease. No new sites of metastatic disease; No acute abdominal/pelvic findings. Stable. Echo March 2023- WNL; NO right sided abnormalities. Await  Chromogranin A- if worsening recommend PEt scan.   #Continue Sandostatin q M.  No obvious progression of disease noticed.  labs today reviewed; see below re: anemia.   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  Declined GI appointment in past.  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   # Hypokalemia-potassium 3.3; secondary to diuretics- -stable.   ## A.fib 2023; March- on eliquis [Dr.Parashoes Cardiologist];  Monitor closely given history of rectal bleeding--stable.   # DISPOSITION- # Bil screening mammogram 1-2 weeks #  Ok with Sandostatin today;Venofer- # in 2 week Venofer.  # in 1months- Sandostatin/venofer # Follow up in 2 months-MD; labs-- cbc/cmp/ chromogranin levels; iron studies;ferritin; b12 -Sandostatin; possible venofer-  Dr.B      Orders Placed This Encounter  Procedures   MM 3D SCREENING MAMMOGRAM BILATERAL BREAST    Standing Status:   Future    Standing Expiration Date:   10/29/2023    Order Specific Question:   Reason for Exam (SYMPTOM  OR DIAGNOSIS REQUIRED)    Answer:   screening    Order Specific Question:   Preferred imaging location?  Answer:   [facility]   Chromogranin A    Standing Status:   Future    Standing Expiration Date:   10/29/2023   CBC (Cancer Center Only)    Standing Status:   Future    Standing Expiration Date:   10/29/2023   CMP (Cancer Center only)    Standing Status:   Future    Standing Expiration Date:   10/29/2023   Ferritin    Standing Status:   Future    Standing Expiration Date:   10/29/2023   Iron and TIBC    Standing Status:   Future    Standing Expiration Date:   10/29/2023    All questions were answered. The patient knows to call the clinic with any problems, questions or concerns.      Earna Coder, MD 10/29/2022 2:52 PM

## 2022-10-29 NOTE — Progress Notes (Signed)
Patient doing okay. Has generalized pain that moves around. Has fatigue, energy is low.  Denies any dizziness. Has chronic diarrhea, no better , no worse. Appetite is fair, does eat.

## 2022-10-29 NOTE — Patient Instructions (Signed)
Iron Sucrose Injection What is this medication? IRON SUCROSE (EYE ern SOO krose) treats low levels of iron (iron deficiency anemia) in people with kidney disease. Iron is a mineral that plays an important role in making red blood cells, which carry oxygen from your lungs to the rest of your body. This medicine may be used for other purposes; ask your health care provider or pharmacist if you have questions. COMMON BRAND NAME(S): Venofer What should I tell my care team before I take this medication? They need to know if you have any of these conditions: Anemia not caused by low iron levels Heart disease High levels of iron in the blood Kidney disease Liver disease An unusual or allergic reaction to iron, other medications, foods, dyes, or preservatives Pregnant or trying to get pregnant Breastfeeding How should I use this medication? This medication is for infusion into a vein. It is given in a hospital or clinic setting. Talk to your care team about the use of this medication in children. While this medication may be prescribed for children as young as 2 years for selected conditions, precautions do apply. Overdosage: If you think you have taken too much of this medicine contact a poison control center or emergency room at once. NOTE: This medicine is only for you. Do not share this medicine with others. What if I miss a dose? Keep appointments for follow-up doses. It is important not to miss your dose. Call your care team if you are unable to keep an appointment. What may interact with this medication? Do not take this medication with any of the following: Deferoxamine Dimercaprol Other iron products This medication may also interact with the following: Chloramphenicol Deferasirox This list may not describe all possible interactions. Give your health care provider a list of all the medicines, herbs, non-prescription drugs, or dietary supplements you use. Also tell them if you smoke,  drink alcohol, or use illegal drugs. Some items may interact with your medicine. What should I watch for while using this medication? Visit your care team regularly. Tell your care team if your symptoms do not start to get better or if they get worse. You may need blood work done while you are taking this medication. You may need to follow a special diet. Talk to your care team. Foods that contain iron include: whole grains/cereals, dried fruits, beans, or peas, leafy green vegetables, and organ meats (liver, kidney). What side effects may I notice from receiving this medication? Side effects that you should report to your care team as soon as possible: Allergic reactions--skin rash, itching, hives, swelling of the face, lips, tongue, or throat Low blood pressure--dizziness, feeling faint or lightheaded, blurry vision Shortness of breath Side effects that usually do not require medical attention (report to your care team if they continue or are bothersome): Flushing Headache Joint pain Muscle pain Nausea Pain, redness, or irritation at injection site This list may not describe all possible side effects. Call your doctor for medical advice about side effects. You may report side effects to FDA at 1-800-FDA-1088. Where should I keep my medication? This medication is given in a hospital or clinic. It will not be stored at home. NOTE: This sheet is a summary. It may not cover all possible information. If you have questions about this medicine, talk to your doctor, pharmacist, or health care provider.  2024 Elsevier/Gold Standard (2022-06-20 00:00:00)  Octreotide Injection Solution What is this medication? OCTREOTIDE (ok TREE oh tide) treats high levels of growth  hormone (acromegaly). It works by reducing the amount of growth hormone your body makes. This reduces symptoms and the risk of health problems caused by too much growth hormone, such as diabetes and heart disease. It may also be used to  treat diarrhea caused by neuroendocrine tumors. It works by slowing down the release of serotonin from the tumor cells. This reduces the number of bowel movements you have. This medicine may be used for other purposes; ask your health care provider or pharmacist if you have questions. COMMON BRAND NAME(S): Berline Lopes, Sandostatin What should I tell my care team before I take this medication? They need to know if you have any of these conditions: Diabetes Gallbladder disease Kidney disease Liver disease Thyroid disease An unusual or allergic reaction to octreotide, other medications, foods, dyes, or preservatives Pregnant or trying to get pregnant Breast-feeding How should I use this medication? This medication is injected under the skin or into a vein. It is usually given by your care team in a hospital or clinic setting. If you get this medication at home, you will be taught how to prepare and give it. Use exactly as directed. Take it as directed on the prescription label at the same time every day. Keep taking it unless your care team tells you to stop. Allow the injection solution to come to room temperature before use. Do not warm it artificially. It is important that you put your used needles and syringes in a special sharps container. Do not put them in a trash can. If you do not have a sharps container, call your pharmacist or care team to get one. Talk to your care team about the use of this medication in children. Special care may be needed. Overdosage: If you think you have taken too much of this medicine contact a poison control center or emergency room at once. NOTE: This medicine is only for you. Do not share this medicine with others. What if I miss a dose? If you miss a dose, take it as soon as you can. If it is almost time for your next dose, take only that dose. Do not take double or extra doses. What may interact with this medication? Bromocriptine Certain medications for  blood pressure, heart disease, irregular heartbeat Cyclosporine Diuretics Medications for diabetes, including insulin Quinidine This list may not describe all possible interactions. Give your health care provider a list of all the medicines, herbs, non-prescription drugs, or dietary supplements you use. Also tell them if you smoke, drink alcohol, or use illegal drugs. Some items may interact with your medicine. What should I watch for while using this medication? Visit your care team for regular checks on your progress. Tell your care team if your symptoms do not start to get better or if they get worse. To help reduce irritation at the injection site, use a different site for each injection and make sure the solution is at room temperature before use. This medication may cause decreases in blood sugar. Signs of low blood sugar include chills, cool, pale skin or cold sweats, drowsiness, extreme hunger, fast heartbeat, headache, nausea, nervousness or anxiety, shakiness, trembling, unsteadiness, tiredness, or weakness. Contact your care team right away if you experience any of these symptoms. This medication may increase blood sugar. The risk may be higher in patients who already have diabetes. Ask your care team what you can do to lower your risk of diabetes while taking this medication. You should make sure you get enough vitamin B12  while you are taking this medication. Discuss the foods you eat and the vitamins you take with your care team. What side effects may I notice from receiving this medication? Side effects that you should report to your care team as soon as possible: Allergic reactions--skin rash, itching, hives, swelling of the face, lips, tongue, or throat Gallbladder problems--severe stomach pain, nausea, vomiting, fever Heart rhythm changes--fast or irregular heartbeat, dizziness, feeling faint or lightheaded, chest pain, trouble breathing High blood sugar (hyperglycemia)--increased  thirst or amount of urine, unusual weakness or fatigue, blurry vision Low blood sugar (hypoglycemia)--tremors or shaking, anxiety, sweating, cold or clammy skin, confusion, dizziness, rapid heartbeat Low thyroid levels (hypothyroidism)--unusual weakness or fatigue, increased sensitivity to cold, constipation, hair loss, dry skin, weight gain, feelings of depression Low vitamin B12 level--pain, tingling, or numbness in the hands or feet, muscle weakness, dizziness, confusion, trouble concentrating Pancreatitis--severe stomach pain that spreads to your back or gets worse after eating or when touched, fever, nausea, vomiting Side effects that usually do not require medical attention (report to your care team if they continue or are bothersome): Diarrhea Dizziness Gas Headache Pain, redness, or irritation at injection site Stomach pain This list may not describe all possible side effects. Call your doctor for medical advice about side effects. You may report side effects to FDA at 1-800-FDA-1088. Where should I keep my medication? Keep out of the reach of children and pets. Store in the refrigerator. Protect from light. Allow to come to room temperature naturally. Do not use artificial heat. If protected from light, the injection may be stored between 20 and 30 degrees C (70 and 86 degrees F) for 14 days. After the initial use, throw away any unused portion of a multiple dose vial after 14 days. Get rid of any unused portions of the ampules after use. To get rid of medications that are no longer needed or have expired: Take the medication to a medication take-back program. Ask your pharmacy or law enforcement to find a location. If you cannot return the medication, ask your pharmacist or care team how to get rid of the medication safely. NOTE: This sheet is a summary. It may not cover all possible information. If you have questions about this medicine, talk to your doctor, pharmacist, or health care  provider.  2024 Elsevier/Gold Standard (2021-04-18 00:00:00)

## 2022-10-29 NOTE — Assessment & Plan Note (Addendum)
#   Non- functional Small bowel/mesenteric carcinoid; s/p Lutathera + sandostatin x4 infusions.  JUNE 2024- CT AP-3.0 x 5.7 cm calcified mesenteric nodal mass in the right mid abdomen  /mesenteric mass in the root of the small bowel mesentery consistent with known carcinoid tumor;  No findings for recurrent hepatic metastatic disease. No new sites of metastatic disease; No acute abdominal/pelvic findings. Stable. Echo March 2023- WNL; NO right sided abnormalities. Await Chromogranin A- if worsening recommend PEt scan.   #Continue Sandostatin q M.  No obvious progression of disease noticed.  labs today reviewed; see below re: anemia.   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  Declined GI appointment in past.  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   # Hypokalemia-potassium 3.3; secondary to diuretics- -stable.   ## A.fib 2023; March- on eliquis [Dr.Parashoes Cardiologist];  Monitor closely given history of rectal bleeding--stable.   # DISPOSITION- # Bil screening mammogram 1-2 weeks #  Ok with Sandostatin today;Venofer- # in 2 week Venofer.  # in 1months- Sandostatin/venofer # Follow up in 2 months-MD; labs-- cbc/cmp/ chromogranin levels; iron studies;ferritin; b12 -Sandostatin; possible venofer-  Dr.B

## 2022-10-30 LAB — CHROMOGRANIN A: Chromogranin A (ng/mL): 110.4 ng/mL — ABNORMAL HIGH (ref 0.0–101.8)

## 2022-10-31 ENCOUNTER — Telehealth: Payer: Self-pay | Admitting: *Deleted

## 2022-10-31 ENCOUNTER — Encounter: Payer: Self-pay | Admitting: Internal Medicine

## 2022-10-31 NOTE — Telephone Encounter (Signed)
Called and spoke to Preston, pt's daughter. I told her that her mom is low on B12 levels. Dr B would like her to start on oral OTC Vit B12 1000u daily. Daughter said they will get started on it.

## 2022-10-31 NOTE — Progress Notes (Signed)
Please inform patient that her B 12 levels are slightly low recommend over-the-counter B12 1000 g once a day.

## 2022-11-05 ENCOUNTER — Ambulatory Visit
Admission: RE | Admit: 2022-11-05 | Discharge: 2022-11-05 | Disposition: A | Discharge status: Home / Self Care | Payer: Medicare Other | Source: Ambulatory Visit | Attending: Internal Medicine | Admitting: Internal Medicine

## 2022-11-05 DIAGNOSIS — C7B02 Secondary carcinoid tumors of liver: Secondary | ICD-10-CM | POA: Insufficient documentation

## 2022-11-05 DIAGNOSIS — Z1231 Encounter for screening mammogram for malignant neoplasm of breast: Secondary | ICD-10-CM | POA: Insufficient documentation

## 2022-11-12 ENCOUNTER — Inpatient Hospital Stay: Payer: Medicare Other

## 2022-11-12 ENCOUNTER — Ambulatory Visit: Payer: BLUE CROSS/BLUE SHIELD

## 2022-11-12 VITALS — BP 130/55 | HR 52 | Temp 97.6°F | Resp 18

## 2022-11-12 DIAGNOSIS — C7A8 Other malignant neuroendocrine tumors: Secondary | ICD-10-CM

## 2022-11-12 DIAGNOSIS — C7A019 Malignant carcinoid tumor of the small intestine, unspecified portion: Secondary | ICD-10-CM | POA: Diagnosis not present

## 2022-11-12 DIAGNOSIS — K6389 Other specified diseases of intestine: Secondary | ICD-10-CM

## 2022-11-12 MED ORDER — SODIUM CHLORIDE 0.9 % IV SOLN
200.0000 mg | Freq: Once | INTRAVENOUS | Status: AC
  Administered 2022-11-12: 200 mg via INTRAVENOUS
  Filled 2022-11-12: qty 200

## 2022-11-12 MED ORDER — SODIUM CHLORIDE 0.9 % IV SOLN
Freq: Once | INTRAVENOUS | Status: AC
  Filled 2022-11-12: qty 250

## 2022-11-25 ENCOUNTER — Other Ambulatory Visit: Payer: Self-pay | Admitting: Internal Medicine

## 2022-11-25 NOTE — Telephone Encounter (Signed)
Component Ref Range & Units 3 wk ago 2 mo ago 4 mo ago 5 mo ago 6 mo ago 8 mo ago 10 mo ago  Potassium 3.5 - 5.1 mmol/L 3.3 Low  3.4 Low  3.5  3.5 3.5 3.6

## 2022-11-28 ENCOUNTER — Inpatient Hospital Stay: Payer: Medicare Other | Attending: Internal Medicine

## 2022-11-28 ENCOUNTER — Ambulatory Visit: Payer: BLUE CROSS/BLUE SHIELD

## 2022-11-28 VITALS — BP 148/55 | HR 62 | Temp 97.8°F | Resp 18

## 2022-11-28 DIAGNOSIS — C7B02 Secondary carcinoid tumors of liver: Secondary | ICD-10-CM | POA: Diagnosis present

## 2022-11-28 DIAGNOSIS — D509 Iron deficiency anemia, unspecified: Secondary | ICD-10-CM | POA: Insufficient documentation

## 2022-11-28 DIAGNOSIS — C7A019 Malignant carcinoid tumor of the small intestine, unspecified portion: Secondary | ICD-10-CM | POA: Diagnosis present

## 2022-11-28 DIAGNOSIS — K6389 Other specified diseases of intestine: Secondary | ICD-10-CM

## 2022-11-28 DIAGNOSIS — E3400 Carcinoid syndrome, unspecified: Secondary | ICD-10-CM | POA: Insufficient documentation

## 2022-11-28 DIAGNOSIS — C7A8 Other malignant neuroendocrine tumors: Secondary | ICD-10-CM

## 2022-11-28 MED ORDER — IRON SUCROSE 20 MG/ML IV SOLN
200.0000 mg | Freq: Once | INTRAVENOUS | Status: AC
  Administered 2022-11-28: 200 mg via INTRAVENOUS
  Filled 2022-11-28: qty 10

## 2022-11-28 MED ORDER — OCTREOTIDE ACETATE 30 MG IM KIT
30.0000 mg | PACK | Freq: Once | INTRAMUSCULAR | Status: AC
  Administered 2022-11-28: 30 mg via INTRAMUSCULAR
  Filled 2022-11-28: qty 1

## 2022-12-29 ENCOUNTER — Other Ambulatory Visit: Payer: BLUE CROSS/BLUE SHIELD

## 2022-12-29 ENCOUNTER — Ambulatory Visit: Payer: BLUE CROSS/BLUE SHIELD | Admitting: Internal Medicine

## 2022-12-29 ENCOUNTER — Ambulatory Visit: Payer: BLUE CROSS/BLUE SHIELD

## 2022-12-30 ENCOUNTER — Other Ambulatory Visit: Payer: Self-pay | Admitting: *Deleted

## 2022-12-30 DIAGNOSIS — D500 Iron deficiency anemia secondary to blood loss (chronic): Secondary | ICD-10-CM

## 2022-12-31 ENCOUNTER — Inpatient Hospital Stay (HOSPITAL_BASED_OUTPATIENT_CLINIC_OR_DEPARTMENT_OTHER): Payer: Medicare Other | Admitting: Internal Medicine

## 2022-12-31 ENCOUNTER — Inpatient Hospital Stay: Payer: Medicare Other | Attending: Internal Medicine

## 2022-12-31 ENCOUNTER — Encounter: Payer: Self-pay | Admitting: Internal Medicine

## 2022-12-31 ENCOUNTER — Inpatient Hospital Stay: Payer: Medicare Other

## 2022-12-31 VITALS — BP 138/60 | HR 55 | Temp 97.7°F | Resp 18

## 2022-12-31 DIAGNOSIS — C7A019 Malignant carcinoid tumor of the small intestine, unspecified portion: Secondary | ICD-10-CM | POA: Insufficient documentation

## 2022-12-31 DIAGNOSIS — E3400 Carcinoid syndrome, unspecified: Secondary | ICD-10-CM | POA: Insufficient documentation

## 2022-12-31 DIAGNOSIS — C7B02 Secondary carcinoid tumors of liver: Secondary | ICD-10-CM | POA: Insufficient documentation

## 2022-12-31 DIAGNOSIS — D509 Iron deficiency anemia, unspecified: Secondary | ICD-10-CM | POA: Diagnosis not present

## 2022-12-31 DIAGNOSIS — Z86718 Personal history of other venous thrombosis and embolism: Secondary | ICD-10-CM | POA: Insufficient documentation

## 2022-12-31 DIAGNOSIS — D500 Iron deficiency anemia secondary to blood loss (chronic): Secondary | ICD-10-CM

## 2022-12-31 DIAGNOSIS — E876 Hypokalemia: Secondary | ICD-10-CM | POA: Diagnosis not present

## 2022-12-31 DIAGNOSIS — I509 Heart failure, unspecified: Secondary | ICD-10-CM | POA: Insufficient documentation

## 2022-12-31 DIAGNOSIS — I130 Hypertensive heart and chronic kidney disease with heart failure and stage 1 through stage 4 chronic kidney disease, or unspecified chronic kidney disease: Secondary | ICD-10-CM | POA: Insufficient documentation

## 2022-12-31 DIAGNOSIS — Z7901 Long term (current) use of anticoagulants: Secondary | ICD-10-CM | POA: Diagnosis not present

## 2022-12-31 DIAGNOSIS — K6389 Other specified diseases of intestine: Secondary | ICD-10-CM

## 2022-12-31 DIAGNOSIS — I4891 Unspecified atrial fibrillation: Secondary | ICD-10-CM | POA: Diagnosis not present

## 2022-12-31 DIAGNOSIS — C7A8 Other malignant neuroendocrine tumors: Secondary | ICD-10-CM

## 2022-12-31 LAB — CBC (CANCER CENTER ONLY)
HCT: 34.6 % — ABNORMAL LOW (ref 36.0–46.0)
Hemoglobin: 11.2 g/dL — ABNORMAL LOW (ref 12.0–15.0)
MCH: 30.3 pg (ref 26.0–34.0)
MCHC: 32.4 g/dL (ref 30.0–36.0)
MCV: 93.5 fL (ref 80.0–100.0)
Platelet Count: 213 10*3/uL (ref 150–400)
RBC: 3.7 MIL/uL — ABNORMAL LOW (ref 3.87–5.11)
RDW: 15.5 % (ref 11.5–15.5)
WBC Count: 5.2 10*3/uL (ref 4.0–10.5)
nRBC: 0 % (ref 0.0–0.2)

## 2022-12-31 LAB — FERRITIN: Ferritin: 70 ng/mL (ref 11–307)

## 2022-12-31 LAB — IRON AND TIBC
Iron: 79 ug/dL (ref 28–170)
Saturation Ratios: 20 % (ref 10.4–31.8)
TIBC: 403 ug/dL (ref 250–450)
UIBC: 324 ug/dL

## 2022-12-31 LAB — CMP (CANCER CENTER ONLY)
ALT: 14 U/L (ref 0–44)
AST: 24 U/L (ref 15–41)
Albumin: 3.6 g/dL (ref 3.5–5.0)
Alkaline Phosphatase: 80 U/L (ref 38–126)
Anion gap: 10 (ref 5–15)
BUN: 16 mg/dL (ref 8–23)
CO2: 24 mmol/L (ref 22–32)
Calcium: 9.1 mg/dL (ref 8.9–10.3)
Chloride: 103 mmol/L (ref 98–111)
Creatinine: 0.77 mg/dL (ref 0.44–1.00)
GFR, Estimated: >60 mL/min (ref >60)
Glucose, Bld: 122 mg/dL — ABNORMAL HIGH (ref 70–99)
Potassium: 3.7 mmol/L (ref 3.5–5.1)
Sodium: 137 mmol/L (ref 135–145)
Total Bilirubin: 0.6 mg/dL (ref <1.2)
Total Protein: 6.8 g/dL (ref 6.5–8.1)

## 2022-12-31 LAB — VITAMIN B12: Vitamin B-12: 370 pg/mL (ref 180–914)

## 2022-12-31 MED ORDER — SODIUM CHLORIDE 0.9% FLUSH
10.0000 mL | INTRAVENOUS | Status: DC | PRN
  Administered 2022-12-31: 10 mL
  Filled 2022-12-31: qty 10

## 2022-12-31 MED ORDER — OCTREOTIDE ACETATE 30 MG IM KIT
30.0000 mg | PACK | Freq: Once | INTRAMUSCULAR | Status: AC
  Administered 2022-12-31: 30 mg via INTRAMUSCULAR
  Filled 2022-12-31: qty 1

## 2022-12-31 MED ORDER — IRON SUCROSE 20 MG/ML IV SOLN
200.0000 mg | Freq: Once | INTRAVENOUS | Status: AC
  Administered 2022-12-31: 200 mg via INTRAVENOUS
  Filled 2022-12-31: qty 10

## 2022-12-31 NOTE — Assessment & Plan Note (Addendum)
#   Non- functional Small bowel/mesenteric carcinoid; s/p Lutathera + sandostatin x4 infusions.  JUNE 2024- CT AP-3.0 x 5.7 cm calcified mesenteric nodal mass in the right mid abdomen  /mesenteric mass in the root of the small bowel mesentery consistent with known carcinoid tumor;  No findings for recurrent hepatic metastatic disease. No new sites of metastatic disease; No acute abdominal/pelvic findings. Stable. Echo March 2023- WNL; NO right sided abnormalities. OCT 2024-Chromogranin A- worsening. I would  recommend PEt scan. Ordered.   #Continue Sandostatin q M.  No obvious clinical evidence of progression of disease noticed.  labs today reviewed; see below re: anemia.   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  Declined GI appointment in past.  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   # Hypokalemia-potassium 3.3; secondary to diuretics- -stable.   ## A.fib 2023; March- on eliquis [Dr.Parashoes Cardiologist];  Monitor closely given history of rectal bleeding--stable.   # DISPOSITION- #  Ok with Sandostatin today;Venofer- # Follow up in 5-6  weeks- MD; labs-- cbc/cmp/ chromogranin levels  b12 -Sandostatin; possible venofer-PET scan prior-  Dr.B

## 2022-12-31 NOTE — Progress Notes (Signed)
[facility] OFFICE PROGRESS NOTE  Patient Care Team: Jerl Mina, [name] Delma Freeze, [name] Marcina Millard, [name] Mick Sell, [name] Earna Coder, [name]   Cancer Staging  No matching staging information was found for the patient.    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   # FEB 2018- Ga PET- uptake in liver/ mesenteric mass; NOV 2019- increase sandostatin to 30mg  q monthly.   #S/p Lutathera x4 every 2 months; last treatment mid September 2022 [Dr.edmunds]; OCT 2022- PET STABLE.   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  In a wheelchair.  Alone.   Emma [name] 82 y.o.  female pleasant patient above history of metastatic carcinoid tumor currently on Sandostatin/; s/p Lutathera infusions every 2 months ; iron deficient anemia question AV malformation; A-fib on Eliquis is here for follow-up.   Patient doing okay. Has generalized pain that moves around. Has fatigue, energy is low. Denies any dizziness. Has chronic diarrhea, no better, no worse. Appetite is fair.   Ambulates at home with a walker. In wheelchair at clinic    Review of Systems  Constitutional:  Positive for malaise/fatigue. Negative for chills, diaphoresis, fever and weight loss.  HENT:  Negative for nosebleeds and sore throat.   Eyes:  Negative for double vision.  Respiratory:  Negative for hemoptysis, sputum production, shortness of breath and wheezing.   Cardiovascular:  Negative for chest pain, palpitations and orthopnea.  Gastrointestinal:  Positive for abdominal pain and diarrhea. Negative for constipation, heartburn, melena, nausea and vomiting.  Genitourinary:  Negative for dysuria, frequency and urgency.  Musculoskeletal:  Positive for back pain. Negative for joint pain.  Skin: Negative.  Negative for itching and rash.  Neurological:  Negative for dizziness, tingling, focal weakness, weakness and headaches.  Endo/Heme/Allergies:  Does not bruise/bleed easily.  Psychiatric/Behavioral:  Negative for depression. The patient is not nervous/anxious and does not have insomnia.       PAST MEDICAL HISTORY :  Past Medical History:  Diagnosis Date   Aromatase inhibitor use    Atrial fibrillation (HCC)    Breast cancer (HCC) 2014  left breast cancer/radiation   Breast cancer, left breast (HCC) 06/19/2014   CHF (congestive heart failure) (HCC)    recent sepsis   Chronic kidney disease    Dizziness    Dyspnea    recently  while admitted   GERD (gastroesophageal reflux disease)    Hearing loss    History of hiatal hernia    History of kidney stones    HOH (hard of hearing)    severe   Hypertension    Hypothyroidism    Leg DVT (deep venous thromboembolism), acute (HCC) 07/2015   left leg after fracture   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   Pain    chest pain while admitted   Personal history of radiation therapy 2015   left breast ca   Thyroid disease     PAST SURGICAL HISTORY :   Past Surgical History:  Procedure Laterality Date   ABDOMINAL HYSTERECTOMY     partial   BREAST LUMPECTOMY Left 01/13/2013   invasive mammary carcinoma, clear margins, LN negative   BREAST LUMPECTOMY WITH SENTINEL LYMPH NODE BIOPSY Left 2014   CHOLECYSTECTOMY     COLONOSCOPY WITH PROPOFOL N/A 09/17/2017   Procedure: COLONOSCOPY WITH PROPOFOL;  Surgeon: Wyline Mood, MD;  Location: [facility] ENDOSCOPY;  Service: Gastroenterology;  Laterality: N/A;   CYSTOSCOPY W/ URETERAL STENT PLACEMENT Right 01/07/2016   Procedure: CYSTOSCOPY WITH STENT REPLACEMENT;  Surgeon: Vanna Scotland, MD;  Location: [facility] ORS;  Service: Urology;  Laterality: Right;   CYSTOSCOPY WITH RETROGRADE PYELOGRAM, URETEROSCOPY AND STENT PLACEMENT Right 12/19/2015   Procedure: CYSTOSCOPY WITH RETROGRADE PYELOGRAM, URETEROSCOPY AND STENT PLACEMENT;  Surgeon: Crist Fat, MD;  Location: [facility] ORS;  Service: Urology;  Laterality: Right;   URETEROSCOPY WITH HOLMIUM LASER LITHOTRIPSY Right 01/07/2016   Procedure: URETEROSCOPY WITH HOLMIUM LASER LITHOTRIPSY;  Surgeon: Vanna Scotland, MD;  Location: [facility] ORS;  Service: Urology;  Laterality: Right;    FAMILY HISTORY :   Family History  Problem Relation Age of Onset   Breast cancer Mother 78       deceased 67   Breast cancer Maternal Aunt 72       deceased 72s   Prostate cancer Father        deceased 18   Colon cancer Paternal Aunt 21       deceased 6s    SOCIAL HISTORY:   Social History   Tobacco Use    Smoking status: Never   Smokeless tobacco: Never  Vaping Use   Vaping status: Never Used  Substance Use Topics   Alcohol use: No    Alcohol/week: 0.0 standard drinks of alcohol   Drug use: No    ALLERGIES:  is allergic to sulfa antibiotics.  MEDICATIONS:  Current Outpatient Medications  Medication Sig Dispense Refill   apixaban (ELIQUIS) 5 MG TABS tablet Take 1 tablet (5 mg total) by mouth 2 (two) times daily. 60 tablet 0   aspirin EC 81 MG tablet Take 81 mg by mouth daily as needed. Pt reports taking if she feels her heart "flutter"     diltiazem (CARDIZEM CD) 180 MG 24 hr capsule Take 1 capsule (180 mg total) by mouth daily. 30 capsule 0   diltiazem (TIADYLT ER) 120 MG 24 hr capsule Take 120 mg by mouth daily.     furosemide (LASIX) 40 MG tablet Take 40 mg by mouth daily.     labetalol (NORMODYNE) 100 MG tablet Take 100 mg by mouth 2 (two) times daily.     levothyroxine (SYNTHROID) 88  MCG tablet Take 88 mcg by mouth daily before breakfast.      losartan-hydrochlorothiazide (HYZAAR) 100-25 MG tablet Take 1 tablet by mouth daily.     nystatin (MYCOSTATIN/NYSTOP) powder APPLY TO AFFECTED AREA TWICE A DAY 15 g 3   pantoprazole (PROTONIX) 40 MG tablet TAKE 1 TABLET BY MOUTH EVERY DAY 90 tablet 0   potassium chloride SA (KLOR-CON M20) 20 MEQ tablet TAKE 1 TABLET BY MOUTH EVERY DAY 90 tablet 0   traMADol (ULTRAM) 50 MG tablet 0.5 tablet to 1 tablet every 8 hours as needed for pain 30 tablet 0   Vitamin D, Ergocalciferol, (DRISDOL) 1.25 MG (50000 UNIT) CAPS capsule TAKE 1 CAPSULE BY MOUTH ONE TIME PER WEEK 12 capsule 3   No current facility-administered medications for this visit.   Facility-Administered Medications Ordered in Other Visits  Medication Dose Route Frequency Provider Last Rate Last Admin   octreotide (SANDOSTATIN LAR) 30 MG IM injection             PHYSICAL EXAMINATION: ECOG PERFORMANCE STATUS: 1 - Symptomatic but completely ambulatory  BP 138/60   Pulse (!) 55   Temp  97.7 F (36.5 C) (Oral)   Resp 18   There were no vitals filed for this visit.       Physical Exam HENT:     Head: Normocephalic and atraumatic.     Mouth/Throat:     Pharynx: No oropharyngeal exudate.  Eyes:     Pupils: Pupils are equal, round, and reactive to light.  Cardiovascular:     Rate and Rhythm: Normal rate. Rhythm irregular.  Pulmonary:     Effort: Pulmonary effort is normal. No respiratory distress.     Breath sounds: Normal breath sounds. No wheezing.  Abdominal:     General: Bowel sounds are normal. There is no distension.     Palpations: Abdomen is soft. There is no mass.     Tenderness: There is no abdominal tenderness. There is no guarding or rebound.  Musculoskeletal:        General: Swelling present. No tenderness. Normal range of motion.     Cervical back: Normal range of motion and neck supple.  Skin:    General: Skin is warm.  Neurological:     Mental Status: She is alert and oriented to person, place, and time.  Psychiatric:        Mood and Affect: Affect normal.     LABORATORY DATA:  I have reviewed the data as listed    Component Value Date/Time   NA 137 10/29/2022 1307   NA 137 07/12/2013 0841   K 3.3 (L) 10/29/2022 1307   K 4.7 07/12/2013 0841   CL 104 10/29/2022 1307   CL 107 07/12/2013 0841   CO2 24 10/29/2022 1307   CO2 20 (L) 07/12/2013 0841   GLUCOSE 129 (H) 10/29/2022 1307   GLUCOSE 122 (H) 07/12/2013 0841   BUN 12 10/29/2022 1307   BUN 18 07/12/2013 0841   CREATININE 0.83 10/29/2022 1307   CREATININE 0.64 05/22/2014 1111   CALCIUM 9.0 10/29/2022 1307   CALCIUM 9.2 07/12/2013 0841   PROT 6.6 10/29/2022 1307   PROT 7.0 05/22/2014 1111   ALBUMIN 3.6 10/29/2022 1307   ALBUMIN 4.2 05/22/2014 1111   AST 21 10/29/2022 1307   ALT 14 10/29/2022 1307   ALT 17 05/22/2014 1111   ALKPHOS 81 10/29/2022 1307   ALKPHOS 95 05/22/2014 1111   BILITOT 0.7 10/29/2022 1307   GFRNONAA >60 10/29/2022 1307  GFRNONAA >60 05/22/2014 1111    GFRAA >60 10/07/2019 1319   GFRAA >60 05/22/2014 1111    No results found for: "SPEP", "UPEP"  Lab Results  Component Value Date   WBC 5.2 12/31/2022   NEUTROABS 4.1 10/29/2022   HGB 11.2 (L) 12/31/2022   HCT 34.6 (L) 12/31/2022   MCV 93.5 12/31/2022   PLT 213 12/31/2022      Chemistry      Component Value Date/Time   NA 137 10/29/2022 1307   NA 137 07/12/2013 0841   K 3.3 (L) 10/29/2022 1307   K 4.7 07/12/2013 0841   CL 104 10/29/2022 1307   CL 107 07/12/2013 0841   CO2 24 10/29/2022 1307   CO2 20 (L) 07/12/2013 0841   BUN 12 10/29/2022 1307   BUN 18 07/12/2013 0841   CREATININE 0.83 10/29/2022 1307   CREATININE 0.64 05/22/2014 1111      Component Value Date/Time   CALCIUM 9.0 10/29/2022 1307   CALCIUM 9.2 07/12/2013 0841   ALKPHOS 81 10/29/2022 1307   ALKPHOS 95 05/22/2014 1111   AST 21 10/29/2022 1307   ALT 14 10/29/2022 1307   ALT 17 05/22/2014 1111   BILITOT 0.7 10/29/2022 1307       RADIOGRAPHIC STUDIES: I have personally reviewed the radiological images as listed and agreed with the findings in the report. No results found.   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  JUNE 2024- CT AP-3.0 x 5.7 cm calcified mesenteric nodal mass in the right mid abdomen  /mesenteric mass in the root of the small bowel mesentery consistent with known carcinoid tumor;  No findings for recurrent hepatic metastatic disease. No new sites of metastatic disease; No acute abdominal/pelvic findings. Stable. Echo March 2023- WNL; NO right sided abnormalities. OCT 2024-Chromogranin A- worsening. I would  recommend PEt scan. Ordered.   #Continue Sandostatin q M.  No obvious clinical evidence of progression of disease noticed.  labs today reviewed; see below re: anemia.   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  Declined GI appointment in past.  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   # Hypokalemia-potassium 3.3; secondary to diuretics- -stable.   ## A.fib 2023; March- on eliquis [Dr.Parashoes Cardiologist];  Monitor closely given history of rectal bleeding--stable.   # DISPOSITION- #  Ok with Sandostatin today;Venofer- # Follow up in 5-6  weeks- MD; labs-- cbc/cmp/ chromogranin levels  b12 -Sandostatin; possible venofer-PET scan prior-  Dr.B      Orders Placed This Encounter  Procedures   NM PET DOTATATE SKULL BASE TO MID THIGH    Standing Status:   Future    Standing Expiration Date:   12/31/2023    Order Specific Question:   If indicated for the ordered procedure, I authorize the administration of a radiopharmaceutical per Radiology protocol    Answer:   Yes    Order Specific Question:   Preferred imaging location?    Answer:   [facility]    All questions were answered. The patient knows to call the clinic with any problems, questions or concerns.      Earna Coder, MD 12/31/2022 3:39 PM

## 2022-12-31 NOTE — Progress Notes (Signed)
Appetitie is good. Energy "so so". Denies dyspnea. No blood in stool.

## 2023-01-02 LAB — CHROMOGRANIN A: Chromogranin A (ng/mL): 97 ng/mL (ref 0.0–101.8)

## 2023-01-08 ENCOUNTER — Encounter: Payer: Self-pay | Admitting: Internal Medicine

## 2023-01-30 ENCOUNTER — Ambulatory Visit
Admission: RE | Admit: 2023-01-30 | Discharge: 2023-01-30 | Disposition: A | Discharge status: Home / Self Care | Payer: Medicare Other | Source: Ambulatory Visit | Attending: Internal Medicine | Admitting: Internal Medicine

## 2023-01-30 DIAGNOSIS — C7B02 Secondary carcinoid tumors of liver: Secondary | ICD-10-CM | POA: Insufficient documentation

## 2023-01-30 DIAGNOSIS — C7B8 Other secondary neuroendocrine tumors: Secondary | ICD-10-CM | POA: Insufficient documentation

## 2023-01-30 MED ORDER — COPPER CU 64 DOTATATE 1 MCI/ML IV SOLN
4.0000 | Freq: Once | INTRAVENOUS | Status: AC
  Administered 2023-01-30: 4.3 via INTRAVENOUS

## 2023-02-06 ENCOUNTER — Inpatient Hospital Stay (HOSPITAL_BASED_OUTPATIENT_CLINIC_OR_DEPARTMENT_OTHER): Payer: Medicare Other | Admitting: Internal Medicine

## 2023-02-06 ENCOUNTER — Inpatient Hospital Stay: Payer: Medicare Other

## 2023-02-06 ENCOUNTER — Inpatient Hospital Stay: Payer: Medicare Other | Attending: Internal Medicine

## 2023-02-06 ENCOUNTER — Encounter: Payer: Self-pay | Admitting: Internal Medicine

## 2023-02-06 ENCOUNTER — Inpatient Hospital Stay: Payer: Medicare Other | Admitting: Internal Medicine

## 2023-02-06 VITALS — BP 131/49 | HR 55 | Resp 16

## 2023-02-06 DIAGNOSIS — E876 Hypokalemia: Secondary | ICD-10-CM | POA: Insufficient documentation

## 2023-02-06 DIAGNOSIS — C7A019 Malignant carcinoid tumor of the small intestine, unspecified portion: Secondary | ICD-10-CM | POA: Insufficient documentation

## 2023-02-06 DIAGNOSIS — R197 Diarrhea, unspecified: Secondary | ICD-10-CM | POA: Insufficient documentation

## 2023-02-06 DIAGNOSIS — C7B02 Secondary carcinoid tumors of liver: Secondary | ICD-10-CM | POA: Diagnosis present

## 2023-02-06 DIAGNOSIS — D509 Iron deficiency anemia, unspecified: Secondary | ICD-10-CM | POA: Diagnosis not present

## 2023-02-06 DIAGNOSIS — E3400 Carcinoid syndrome, unspecified: Secondary | ICD-10-CM | POA: Insufficient documentation

## 2023-02-06 DIAGNOSIS — I4891 Unspecified atrial fibrillation: Secondary | ICD-10-CM | POA: Insufficient documentation

## 2023-02-06 DIAGNOSIS — Z86718 Personal history of other venous thrombosis and embolism: Secondary | ICD-10-CM | POA: Diagnosis not present

## 2023-02-06 DIAGNOSIS — Z7901 Long term (current) use of anticoagulants: Secondary | ICD-10-CM | POA: Insufficient documentation

## 2023-02-06 DIAGNOSIS — K6389 Other specified diseases of intestine: Secondary | ICD-10-CM

## 2023-02-06 DIAGNOSIS — Z09 Encounter for follow-up examination after completed treatment for conditions other than malignant neoplasm: Secondary | ICD-10-CM | POA: Diagnosis not present

## 2023-02-06 DIAGNOSIS — C7A8 Other malignant neuroendocrine tumors: Secondary | ICD-10-CM

## 2023-02-06 LAB — CBC WITH DIFFERENTIAL (CANCER CENTER ONLY)
Abs Immature Granulocytes: 0.02 10*3/uL (ref 0.00–0.07)
Basophils Absolute: 0.1 10*3/uL (ref 0.0–0.1)
Basophils Relative: 1 %
Eosinophils Absolute: 0.3 10*3/uL (ref 0.0–0.5)
Eosinophils Relative: 6 %
HCT: 29.7 % — ABNORMAL LOW (ref 36.0–46.0)
Hemoglobin: 9.8 g/dL — ABNORMAL LOW (ref 12.0–15.0)
Immature Granulocytes: 0 %
Lymphocytes Relative: 10 %
Lymphs Abs: 0.5 10*3/uL — ABNORMAL LOW (ref 0.7–4.0)
MCH: 31.5 pg (ref 26.0–34.0)
MCHC: 33 g/dL (ref 30.0–36.0)
MCV: 95.5 fL (ref 80.0–100.0)
Monocytes Absolute: 0.9 10*3/uL (ref 0.1–1.0)
Monocytes Relative: 16 %
Neutro Abs: 3.7 10*3/uL (ref 1.7–7.7)
Neutrophils Relative %: 67 %
Platelet Count: 206 10*3/uL (ref 150–400)
RBC: 3.11 MIL/uL — ABNORMAL LOW (ref 3.87–5.11)
RDW: 15.1 % (ref 11.5–15.5)
WBC Count: 5.5 10*3/uL (ref 4.0–10.5)
nRBC: 0 % (ref 0.0–0.2)

## 2023-02-06 LAB — CMP (CANCER CENTER ONLY)
ALT: 16 U/L (ref 0–44)
AST: 24 U/L (ref 15–41)
Albumin: 3.6 g/dL (ref 3.5–5.0)
Alkaline Phosphatase: 75 U/L (ref 38–126)
Anion gap: 9 (ref 5–15)
BUN: 20 mg/dL (ref 8–23)
CO2: 23 mmol/L (ref 22–32)
Calcium: 9.3 mg/dL (ref 8.9–10.3)
Chloride: 104 mmol/L (ref 98–111)
Creatinine: 0.68 mg/dL (ref 0.44–1.00)
GFR, Estimated: >60 mL/min (ref >60)
Glucose, Bld: 135 mg/dL — ABNORMAL HIGH (ref 70–99)
Potassium: 3.6 mmol/L (ref 3.5–5.1)
Sodium: 136 mmol/L (ref 135–145)
Total Bilirubin: 0.6 mg/dL (ref 0.0–1.2)
Total Protein: 6.4 g/dL — ABNORMAL LOW (ref 6.5–8.1)

## 2023-02-06 LAB — VITAMIN B12: Vitamin B-12: 166 pg/mL — ABNORMAL LOW (ref 180–914)

## 2023-02-06 MED ORDER — IRON SUCROSE 20 MG/ML IV SOLN
200.0000 mg | Freq: Once | INTRAVENOUS | Status: AC
  Administered 2023-02-06: 200 mg via INTRAVENOUS
  Filled 2023-02-06: qty 10

## 2023-02-06 MED ORDER — OCTREOTIDE ACETATE 30 MG IM KIT
30.0000 mg | PACK | Freq: Once | INTRAMUSCULAR | Status: AC
  Administered 2023-02-06: 30 mg via INTRAMUSCULAR
  Filled 2023-02-06: qty 1

## 2023-02-06 NOTE — Assessment & Plan Note (Addendum)
#   Non- functional Small bowel/mesenteric carcinoid; s/p Lutathera  + sandostatin  x4 infusions.  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 Echo March 2023- WNL; NO right sided abnormalities.  #Continue Sandostatin  q M.  No obvious clinical evidence of progression of disease noticed.  labs today reviewed; see below re: anemia.   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  Declined GI appointment in past.  stable  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   # Hypokalemia-potassium 3.3; secondary to diuretics- -stable.   ## A.fib 2023; March- on eliquis  [Dr.Parashoes Cardiologist];  Monitor closely given history of rectal bleeding--stable.   # b12 def- on oral B12 supplementation.  # DISPOSITION- #  Ok with Sandostatin  today; Venofer - #  in 2 weeks Venofer  # Follow up in 4 weeks MD; labs-- cbc/cmp/ chromogranin levels  b12 -Sandostatin ; possible venofer -  Dr.B  # I reviewed the blood work- with the patient in detail; also reviewed the imaging independently [as summarized above]; and with the patient in detail.

## 2023-02-06 NOTE — Patient Instructions (Signed)
Iron Sucrose Injection What is this medication? IRON SUCROSE (EYE ern SOO krose) treats low levels of iron (iron deficiency anemia) in people with kidney disease. Iron is a mineral that plays an important role in making red blood cells, which carry oxygen from your lungs to the rest of your body. This medicine may be used for other purposes; ask your health care provider or pharmacist if you have questions. COMMON BRAND NAME(S): Venofer What should I tell my care team before I take this medication? They need to know if you have any of these conditions: Anemia not caused by low iron levels Heart disease High levels of iron in the blood Kidney disease Liver disease An unusual or allergic reaction to iron, other medications, foods, dyes, or preservatives Pregnant or trying to get pregnant Breastfeeding How should I use this medication? This medication is for infusion into a vein. It is given in a hospital or clinic setting. Talk to your care team about the use of this medication in children. While this medication may be prescribed for children as young as 2 years for selected conditions, precautions do apply. Overdosage: If you think you have taken too much of this medicine contact a poison control center or emergency room at once. NOTE: This medicine is only for you. Do not share this medicine with others. What if I miss a dose? Keep appointments for follow-up doses. It is important not to miss your dose. Call your care team if you are unable to keep an appointment. What may interact with this medication? Do not take this medication with any of the following: Deferoxamine Dimercaprol Other iron products This medication may also interact with the following: Chloramphenicol Deferasirox This list may not describe all possible interactions. Give your health care provider a list of all the medicines, herbs, non-prescription drugs, or dietary supplements you use. Also tell them if you smoke,  drink alcohol, or use illegal drugs. Some items may interact with your medicine. What should I watch for while using this medication? Visit your care team regularly. Tell your care team if your symptoms do not start to get better or if they get worse. You may need blood work done while you are taking this medication. You may need to follow a special diet. Talk to your care team. Foods that contain iron include: whole grains/cereals, dried fruits, beans, or peas, leafy green vegetables, and organ meats (liver, kidney). What side effects may I notice from receiving this medication? Side effects that you should report to your care team as soon as possible: Allergic reactions--skin rash, itching, hives, swelling of the face, lips, tongue, or throat Low blood pressure--dizziness, feeling faint or lightheaded, blurry vision Shortness of breath Side effects that usually do not require medical attention (report to your care team if they continue or are bothersome): Flushing Headache Joint pain Muscle pain Nausea Pain, redness, or irritation at injection site This list may not describe all possible side effects. Call your doctor for medical advice about side effects. You may report side effects to FDA at 1-800-FDA-1088. Where should I keep my medication? This medication is given in a hospital or clinic. It will not be stored at home. NOTE: This sheet is a summary. It may not cover all possible information. If you have questions about this medicine, talk to your doctor, pharmacist, or health care provider.  2024 Elsevier/Gold Standard (2022-06-20 00:00:00)  Octreotide Injection Solution What is this medication? OCTREOTIDE (ok TREE oh tide) treats high levels of growth  hormone (acromegaly). It works by reducing the amount of growth hormone your body makes. This reduces symptoms and the risk of health problems caused by too much growth hormone, such as diabetes and heart disease. It may also be used to  treat diarrhea caused by neuroendocrine tumors. It works by slowing down the release of serotonin from the tumor cells. This reduces the number of bowel movements you have. This medicine may be used for other purposes; ask your health care provider or pharmacist if you have questions. COMMON BRAND NAME(S): Berline Lopes, Sandostatin What should I tell my care team before I take this medication? They need to know if you have any of these conditions: Diabetes Gallbladder disease Kidney disease Liver disease Thyroid disease An unusual or allergic reaction to octreotide, other medications, foods, dyes, or preservatives Pregnant or trying to get pregnant Breast-feeding How should I use this medication? This medication is injected under the skin or into a vein. It is usually given by your care team in a hospital or clinic setting. If you get this medication at home, you will be taught how to prepare and give it. Use exactly as directed. Take it as directed on the prescription label at the same time every day. Keep taking it unless your care team tells you to stop. Allow the injection solution to come to room temperature before use. Do not warm it artificially. It is important that you put your used needles and syringes in a special sharps container. Do not put them in a trash can. If you do not have a sharps container, call your pharmacist or care team to get one. Talk to your care team about the use of this medication in children. Special care may be needed. Overdosage: If you think you have taken too much of this medicine contact a poison control center or emergency room at once. NOTE: This medicine is only for you. Do not share this medicine with others. What if I miss a dose? If you miss a dose, take it as soon as you can. If it is almost time for your next dose, take only that dose. Do not take double or extra doses. What may interact with this medication? Bromocriptine Certain medications for  blood pressure, heart disease, irregular heartbeat Cyclosporine Diuretics Medications for diabetes, including insulin Quinidine This list may not describe all possible interactions. Give your health care provider a list of all the medicines, herbs, non-prescription drugs, or dietary supplements you use. Also tell them if you smoke, drink alcohol, or use illegal drugs. Some items may interact with your medicine. What should I watch for while using this medication? Visit your care team for regular checks on your progress. Tell your care team if your symptoms do not start to get better or if they get worse. To help reduce irritation at the injection site, use a different site for each injection and make sure the solution is at room temperature before use. This medication may cause decreases in blood sugar. Signs of low blood sugar include chills, cool, pale skin or cold sweats, drowsiness, extreme hunger, fast heartbeat, headache, nausea, nervousness or anxiety, shakiness, trembling, unsteadiness, tiredness, or weakness. Contact your care team right away if you experience any of these symptoms. This medication may increase blood sugar. The risk may be higher in patients who already have diabetes. Ask your care team what you can do to lower your risk of diabetes while taking this medication. You should make sure you get enough vitamin B12  while you are taking this medication. Discuss the foods you eat and the vitamins you take with your care team. What side effects may I notice from receiving this medication? Side effects that you should report to your care team as soon as possible: Allergic reactions--skin rash, itching, hives, swelling of the face, lips, tongue, or throat Gallbladder problems--severe stomach pain, nausea, vomiting, fever Heart rhythm changes--fast or irregular heartbeat, dizziness, feeling faint or lightheaded, chest pain, trouble breathing High blood sugar (hyperglycemia)--increased  thirst or amount of urine, unusual weakness or fatigue, blurry vision Low blood sugar (hypoglycemia)--tremors or shaking, anxiety, sweating, cold or clammy skin, confusion, dizziness, rapid heartbeat Low thyroid levels (hypothyroidism)--unusual weakness or fatigue, increased sensitivity to cold, constipation, hair loss, dry skin, weight gain, feelings of depression Low vitamin B12 level--pain, tingling, or numbness in the hands or feet, muscle weakness, dizziness, confusion, trouble concentrating Pancreatitis--severe stomach pain that spreads to your back or gets worse after eating or when touched, fever, nausea, vomiting Side effects that usually do not require medical attention (report to your care team if they continue or are bothersome): Diarrhea Dizziness Gas Headache Pain, redness, or irritation at injection site Stomach pain This list may not describe all possible side effects. Call your doctor for medical advice about side effects. You may report side effects to FDA at 1-800-FDA-1088. Where should I keep my medication? Keep out of the reach of children and pets. Store in the refrigerator. Protect from light. Allow to come to room temperature naturally. Do not use artificial heat. If protected from light, the injection may be stored between 20 and 30 degrees C (70 and 86 degrees F) for 14 days. After the initial use, throw away any unused portion of a multiple dose vial after 14 days. Get rid of any unused portions of the ampules after use. To get rid of medications that are no longer needed or have expired: Take the medication to a medication take-back program. Ask your pharmacy or law enforcement to find a location. If you cannot return the medication, ask your pharmacist or care team how to get rid of the medication safely. NOTE: This sheet is a summary. It may not cover all possible information. If you have questions about this medicine, talk to your doctor, pharmacist, or health care  provider.  2024 Elsevier/Gold Standard (2021-04-18 00:00:00)

## 2023-02-06 NOTE — Progress Notes (Signed)
 [name] OFFICE PROGRESS NOTE  Patient Care Team: Emma Agent, [name] [name] LABOR, [name] Emma Blunt, [name] Emma Alm SQUIBB, [name] Emma Emma SAUNDERS, [name]   Cancer Staging  No matching staging information was found for the patient.    Oncology History Overview Note  # JUNE 2015-MESENTERIC MASS [~5-6cm] Presumed CARCINOID with mets to liver [AUG 2016-Octreoscan; [name]; [name]]; FEB 2016- START OCTREOTIDE  LAR qM; Octreo scan- FEB 2017- STABLE; cont Octreotide '; OCT 5th OCTREO SCAN- STABLE; mesenteric mass present.   # FEB 2018- Ga PET- uptake in liver/ mesenteric mass; NOV 2019- increase sandostatin  to 30mg  q monthly.   #S/p Lutathera  x4 every 2 months; last treatment mid September 2022 [[name]]; OCT 2022- PET STABLE.   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  In a wheelchair.  Alone.   Emma [name] 83 y.o.  female pleasant patient above history of metastatic carcinoid tumor currently on Sandostatin /; s/p Lutathera  infusions every 2 months ; iron  deficient anemia question AV malformation; A-fib on Eliquis  is here for follow-up/ and review the results of the PET scan.  Patient has chronic fatigue not any worse.  Chronic mild diarrhea not any worse.  Intermittent blood in stools.  No nausea no vomiting.  Ambulates at home with a walker. In wheelchair at clinic    Review of Systems  Constitutional:  Positive for malaise/fatigue. Negative for chills, diaphoresis, fever and weight loss.  HENT:  Negative for nosebleeds and sore throat.   Eyes:  Negative for double vision.  Respiratory:  Negative for hemoptysis, sputum production, shortness of breath and wheezing.   Cardiovascular:  Negative for chest pain, palpitations and orthopnea.  Gastrointestinal:  Positive for abdominal pain and diarrhea. Negative for constipation, heartburn, melena, nausea and vomiting.  Genitourinary:  Negative for dysuria, frequency and urgency.  Musculoskeletal:  Positive for back pain. Negative for joint pain.  Skin: Negative.  Negative for itching and rash.  Neurological:  Negative for dizziness, tingling, focal weakness, weakness and headaches.  Endo/Heme/Allergies:  Does not bruise/bleed easily.  Psychiatric/Behavioral:  Negative for depression. The patient is not nervous/anxious and does not have insomnia.       PAST MEDICAL HISTORY :  Past Medical History:  Diagnosis Date   Aromatase inhibitor use    Atrial fibrillation (HCC)    Breast cancer (HCC)  2014   left breast cancer/radiation   Breast cancer, left breast (HCC) 06/19/2014   CHF (congestive heart failure) (HCC)    recent sepsis   Chronic kidney disease    Dizziness    Dyspnea    recently  while admitted   GERD (gastroesophageal reflux disease)    Hearing loss    History of hiatal hernia    History of kidney stones    HOH (hard of hearing)    severe   Hypertension    Hypothyroidism    Leg DVT (deep venous thromboembolism), acute (HCC) 07/2015   left leg after fracture   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   Pain    chest pain while admitted   Personal history of radiation therapy 2015   left breast ca   Thyroid  disease     PAST SURGICAL HISTORY :   Past Surgical History:  Procedure Laterality Date   ABDOMINAL HYSTERECTOMY     partial   BREAST LUMPECTOMY Left 01/13/2013   invasive mammary carcinoma, clear margins, LN negative   BREAST LUMPECTOMY WITH SENTINEL LYMPH NODE BIOPSY Left 2014   CHOLECYSTECTOMY     COLONOSCOPY WITH PROPOFOL  N/A 09/17/2017   Procedure: COLONOSCOPY WITH PROPOFOL ;  Surgeon: Therisa Bi, MD;  Location: [name] ENDOSCOPY;  Service: Gastroenterology;  Laterality: N/A;   CYSTOSCOPY W/ URETERAL STENT PLACEMENT Right 01/07/2016   Procedure: CYSTOSCOPY WITH STENT REPLACEMENT;  Surgeon: Rosina Riis, MD;  Location: [facility] ORS;  Service: Urology;  Laterality: Right;   CYSTOSCOPY WITH RETROGRADE PYELOGRAM, URETEROSCOPY AND STENT PLACEMENT Right 12/19/2015   Procedure: CYSTOSCOPY WITH RETROGRADE PYELOGRAM, URETEROSCOPY AND STENT PLACEMENT;  Surgeon: Morene LELON Salines, MD;  Location: [facility] ORS;  Service: Urology;  Laterality: Right;   URETEROSCOPY WITH HOLMIUM LASER LITHOTRIPSY Right 01/07/2016   Procedure: URETEROSCOPY WITH HOLMIUM LASER LITHOTRIPSY;  Surgeon: Rosina Riis, MD;  Location: [facility] ORS;  Service: Urology;  Laterality: Right;    FAMILY HISTORY :   Family History  Problem Relation Age of Onset   Breast cancer Mother 65       deceased 44   Breast cancer Maternal Aunt 72       deceased 44s   Prostate cancer Father        deceased 9   Colon cancer Paternal Aunt 65       deceased 53s    SOCIAL HISTORY:   Social History   Tobacco Use    Smoking status: Never   Smokeless tobacco: Never  Vaping Use   Vaping status: Never Used  Substance Use Topics   Alcohol use: No    Alcohol/week: 0.0 standard drinks of alcohol   Drug use: No    ALLERGIES:  is allergic to sulfa antibiotics.  MEDICATIONS:  Current Outpatient Medications  Medication Sig Dispense Refill   apixaban  (ELIQUIS ) 5 MG TABS tablet Take 1 tablet (5 mg total) by mouth 2 (two) times daily. 60 tablet 0   aspirin  EC 81 MG tablet Take 81 mg by mouth daily as needed. Pt reports taking if she feels her heart flutter     diltiazem  (CARDIZEM  CD) 180 MG 24 hr capsule Take 1 capsule (180 mg total) by mouth daily. 30 capsule 0   diltiazem  (TIADYLT  ER) 120 MG 24 hr capsule Take 120 mg by mouth daily.     furosemide  (LASIX ) 40 MG tablet Take 40 mg by mouth daily.     labetalol  (NORMODYNE ) 100 MG tablet Take 100 mg by mouth 2 (two) times daily.  levothyroxine  (SYNTHROID ) 88 MCG tablet Take 88 mcg by mouth daily before breakfast.      losartan -hydrochlorothiazide  (HYZAAR) 100-25 MG tablet Take 1 tablet by mouth daily.     nystatin  (MYCOSTATIN /NYSTOP ) powder APPLY TO AFFECTED AREA TWICE A DAY 15 g 3   pantoprazole  (PROTONIX ) 40 MG tablet TAKE 1 TABLET BY MOUTH EVERY DAY 90 tablet 0   potassium chloride  SA (KLOR-CON  M20) 20 MEQ tablet TAKE 1 TABLET BY MOUTH EVERY DAY 90 tablet 0   traMADol  (ULTRAM ) 50 MG tablet 0.5 tablet to 1 tablet every 8 hours as needed for pain 30 tablet 0   Vitamin D , Ergocalciferol , (DRISDOL ) 1.25 MG (50000 UNIT) CAPS capsule TAKE 1 CAPSULE BY MOUTH ONE TIME PER WEEK 12 capsule 3   No current facility-administered medications for this visit.   Facility-Administered Medications Ordered in Other Visits  Medication Dose Route Frequency Provider Last Rate Last Admin   octreotide  (SANDOSTATIN  LAR) 30 MG IM injection            octreotide  (SANDOSTATIN  LAR) IM injection 30 mg  30 mg Intramuscular Once Nicklaus Alviar R, MD        PHYSICAL  EXAMINATION: ECOG PERFORMANCE STATUS: 1 - Symptomatic but completely ambulatory  BP (!) 135/55 (BP Location: Right Arm, Patient Position: Sitting, Cuff Size: Large)   Pulse 61   Temp 97.9 F (36.6 C) (Oral)   Ht 5' 5 (1.651 m)   Wt 210 lb 11.2 oz (95.6 kg)   SpO2 95%   BMI 35.06 kg/m   Filed Weights   02/06/23 1119  Weight: 210 lb 11.2 oz (95.6 kg)         Physical Exam HENT:     Head: Normocephalic and atraumatic.     Mouth/Throat:     Pharynx: No oropharyngeal exudate.  Eyes:     Pupils: Pupils are equal, round, and reactive to light.  Cardiovascular:     Rate and Rhythm: Normal rate. Rhythm irregular.  Pulmonary:     Effort: Pulmonary effort is normal. No respiratory distress.     Breath sounds: Normal breath sounds. No wheezing.  Abdominal:     General: Bowel sounds are normal. There is no distension.     Palpations: Abdomen is soft. There is no mass.     Tenderness: There is no abdominal tenderness. There is no guarding or rebound.  Musculoskeletal:        General: Swelling present. No tenderness. Normal range of motion.     Cervical back: Normal range of motion and neck supple.  Skin:    General: Skin is warm.  Neurological:     Mental Status: She is alert and oriented to person, place, and time.  Psychiatric:        Mood and Affect: Affect normal.     LABORATORY DATA:  I have reviewed the data as listed    Component Value Date/Time   NA 136 02/06/2023 1107   NA 137 07/12/2013 0841   K 3.6 02/06/2023 1107   K 4.7 07/12/2013 0841   CL 104 02/06/2023 1107   CL 107 07/12/2013 0841   CO2 23 02/06/2023 1107   CO2 20 (L) 07/12/2013 0841   GLUCOSE 135 (H) 02/06/2023 1107   GLUCOSE 122 (H) 07/12/2013 0841   BUN 20 02/06/2023 1107   BUN 18 07/12/2013 0841   CREATININE 0.68 02/06/2023 1107   CREATININE 0.64 05/22/2014 1111   CALCIUM 9.3 02/06/2023 1107   CALCIUM 9.2 07/12/2013 0841   PROT  6.4 (L) 02/06/2023 1107   PROT 7.0 05/22/2014 1111    ALBUMIN 3.6 02/06/2023 1107   ALBUMIN 4.2 05/22/2014 1111   AST 24 02/06/2023 1107   ALT 16 02/06/2023 1107   ALT 17 05/22/2014 1111   ALKPHOS 75 02/06/2023 1107   ALKPHOS 95 05/22/2014 1111   BILITOT 0.6 02/06/2023 1107   GFRNONAA >60 02/06/2023 1107   GFRNONAA >60 05/22/2014 1111   GFRAA >60 10/07/2019 1319   GFRAA >60 05/22/2014 1111    No results found for: SPEP, UPEP  Lab Results  Component Value Date   WBC 5.5 02/06/2023   NEUTROABS 3.7 02/06/2023   HGB 9.8 (L) 02/06/2023   HCT 29.7 (L) 02/06/2023   MCV 95.5 02/06/2023   PLT 206 02/06/2023      Chemistry      Component Value Date/Time   NA 136 02/06/2023 1107   NA 137 07/12/2013 0841   K 3.6 02/06/2023 1107   K 4.7 07/12/2013 0841   CL 104 02/06/2023 1107   CL 107 07/12/2013 0841   CO2 23 02/06/2023 1107   CO2 20 (L) 07/12/2013 0841   BUN 20 02/06/2023 1107   BUN 18 07/12/2013 0841   CREATININE 0.68 02/06/2023 1107   CREATININE 0.64 05/22/2014 1111      Component Value Date/Time   CALCIUM 9.3 02/06/2023 1107   CALCIUM 9.2 07/12/2013 0841   ALKPHOS 75 02/06/2023 1107   ALKPHOS 95 05/22/2014 1111   AST 24 02/06/2023 1107   ALT 16 02/06/2023 1107   ALT 17 05/22/2014 1111   BILITOT 0.6 02/06/2023 1107       RADIOGRAPHIC STUDIES: I have personally reviewed the radiological images as listed and agreed with the findings in the report. No results found.   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 Focus of intense radiotracer activity within the adjacent small bowel consistent with neuroendocrine tumor of the  small bowel; Peritoneal metastasis in the ventral peritoneal space not changed from prior. Echo March 2023- WNL; NO right sided abnormalities.  #Continue Sandostatin  q M.  No obvious clinical evidence of progression of disease noticed.  labs today reviewed; see below re: anemia.   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  Declined GI appointment in past.  stable  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   # Hypokalemia-potassium 3.3; secondary to diuretics- -stable.   ## A.fib 2023; March- on eliquis  [Dr.Parashoes Cardiologist];  Monitor closely given history of rectal bleeding--stable.   # b12 def- on oral B12 supplementation.  # DISPOSITION- #  Ok with Sandostatin  today; Venofer - #  in 2 weeks Venofer  # Follow up in 4 weeks MD; labs-- cbc/cmp/ chromogranin levels  b12 -Sandostatin ; possible venofer -  Dr.B  # I reviewed the blood work- with the patient in detail; also reviewed the imaging independently [as summarized above]; and with the patient in detail.        Orders Placed This Encounter  Procedures   CBC with Differential ([name])    Standing Status:   Future  Expected Date:   03/06/2023    Expiration Date:   02/06/2024   CMP ([name])    Standing Status:   Future    Expected Date:   03/06/2023    Expiration Date:   02/06/2024   Chromogranin A    Standing Status:   Future    Expected Date:   03/06/2023    Expiration Date:   02/06/2024   Vitamin B12    Standing Status:   Future    Expected Date:   03/06/2023    Expiration Date:   02/06/2024    All questions were answered. The patient knows to call the clinic with any problems, questions or concerns.      Emma JONELLE Joe, MD 02/06/2023 12:15 PM

## 2023-02-06 NOTE — Progress Notes (Signed)
 PET 01/30/23.

## 2023-02-10 ENCOUNTER — Other Ambulatory Visit: Payer: Self-pay | Admitting: Internal Medicine

## 2023-02-10 DIAGNOSIS — E538 Deficiency of other specified B group vitamins: Secondary | ICD-10-CM | POA: Insufficient documentation

## 2023-02-10 LAB — CHROMOGRANIN A: Chromogranin A (ng/mL): 121.8 ng/mL — ABNORMAL HIGH (ref 0.0–101.8)

## 2023-02-10 NOTE — Progress Notes (Signed)
 BC- Please add a B12 injection at the next visit- GB

## 2023-02-20 ENCOUNTER — Inpatient Hospital Stay: Payer: Medicare Other

## 2023-02-20 VITALS — BP 131/50 | HR 61 | Temp 97.0°F | Resp 18

## 2023-02-20 DIAGNOSIS — C7A8 Other malignant neuroendocrine tumors: Secondary | ICD-10-CM

## 2023-02-20 DIAGNOSIS — K6389 Other specified diseases of intestine: Secondary | ICD-10-CM

## 2023-02-20 DIAGNOSIS — C7A019 Malignant carcinoid tumor of the small intestine, unspecified portion: Secondary | ICD-10-CM | POA: Diagnosis not present

## 2023-02-20 MED ORDER — IRON SUCROSE 20 MG/ML IV SOLN
200.0000 mg | Freq: Once | INTRAVENOUS | Status: AC
  Administered 2023-02-20: 200 mg via INTRAVENOUS
  Filled 2023-02-20: qty 10

## 2023-02-20 MED ORDER — CYANOCOBALAMIN 1000 MCG/ML IJ SOLN
1000.0000 ug | Freq: Once | INTRAMUSCULAR | Status: AC
Start: 2023-02-20 — End: 2023-02-20
  Administered 2023-02-20: 1000 ug via INTRAMUSCULAR
  Filled 2023-02-20: qty 1

## 2023-02-20 NOTE — Patient Instructions (Signed)
[facility] - A DEPT OF [facility]  Discharge Instructions: Thank you for choosing [facility] to provide your oncology and hematology care.  If you have a lab appointment with the Cancer Center, please go directly to the Cancer Center and check in at the registration area.  Wear comfortable clothing and clothing appropriate for easy access to any Portacath or PICC line.   We strive to give you quality time with your provider. You may need to reschedule your appointment if you arrive late (15 or more minutes).  Arriving late affects you and other patients whose appointments are after yours.  Also, if you miss three or more appointments without notifying the office, you may be dismissed from the clinic at the provider's discretion.      For prescription refill requests, have your pharmacy contact our office and allow 72 hours for refills to be completed.    Today you received the following chemotherapy and/or immunotherapy agents B12 and venofer.      To help prevent nausea and vomiting after your treatment, we encourage you to take your nausea medication as directed.  BELOW ARE SYMPTOMS THAT SHOULD BE REPORTED IMMEDIATELY: *FEVER GREATER THAN 100.4 F (38 C) OR HIGHER *CHILLS OR SWEATING *NAUSEA AND VOMITING THAT IS NOT CONTROLLED WITH YOUR NAUSEA MEDICATION *UNUSUAL SHORTNESS OF BREATH *UNUSUAL BRUISING OR BLEEDING *URINARY PROBLEMS (pain or burning when urinating, or frequent urination) *BOWEL PROBLEMS (unusual diarrhea, constipation, pain near the anus) TENDERNESS IN MOUTH AND THROAT WITH OR WITHOUT PRESENCE OF ULCERS (sore throat, sores in mouth, or a toothache) UNUSUAL RASH, SWELLING OR PAIN  UNUSUAL VAGINAL DISCHARGE OR ITCHING   Items with * indicate a potential emergency and should be followed up as soon as possible or go to the Emergency Department if any problems should occur.  Please show the CHEMOTHERAPY ALERT CARD or  IMMUNOTHERAPY ALERT CARD at check-in to the Emergency Department and triage nurse.  Should you have questions after your visit or need to cancel or reschedule your appointment, please contact [facility] - A DEPT OF [facility]  [phone] and follow the prompts.  Office hours are 8:00 a.m. to 4:30 p.m. Monday - Friday. Please note that voicemails left after 4:00 p.m. may not be returned until the following business day.  We are closed weekends and major holidays. You have access to a nurse at all times for urgent questions. Please call the main number to the clinic [phone] and follow the prompts.  For any non-urgent questions, you may also contact your provider using MyChart. We now offer e-Visits for anyone 77 and older to request care online for non-urgent symptoms. For details visit [email or web address].   Also download the MyChart app! Go to the app store, search "MyChart", open the app, select [facility], and log in with your MyChart username and password.

## 2023-02-24 ENCOUNTER — Other Ambulatory Visit: Payer: Self-pay | Admitting: Nurse Practitioner

## 2023-03-06 ENCOUNTER — Inpatient Hospital Stay: Payer: Medicare Other | Attending: Internal Medicine

## 2023-03-06 ENCOUNTER — Encounter: Payer: Self-pay | Admitting: Internal Medicine

## 2023-03-06 ENCOUNTER — Inpatient Hospital Stay (HOSPITAL_BASED_OUTPATIENT_CLINIC_OR_DEPARTMENT_OTHER): Payer: Medicare Other | Admitting: Internal Medicine

## 2023-03-06 ENCOUNTER — Inpatient Hospital Stay: Payer: Medicare Other

## 2023-03-06 VITALS — BP 130/56 | HR 59 | Temp 97.8°F | Resp 19 | Wt 205.3 lb

## 2023-03-06 DIAGNOSIS — C7A019 Malignant carcinoid tumor of the small intestine, unspecified portion: Secondary | ICD-10-CM | POA: Diagnosis present

## 2023-03-06 DIAGNOSIS — C7A8 Other malignant neuroendocrine tumors: Secondary | ICD-10-CM

## 2023-03-06 DIAGNOSIS — C7B02 Secondary carcinoid tumors of liver: Secondary | ICD-10-CM | POA: Insufficient documentation

## 2023-03-06 DIAGNOSIS — D509 Iron deficiency anemia, unspecified: Secondary | ICD-10-CM | POA: Diagnosis not present

## 2023-03-06 DIAGNOSIS — Z7901 Long term (current) use of anticoagulants: Secondary | ICD-10-CM | POA: Insufficient documentation

## 2023-03-06 DIAGNOSIS — E876 Hypokalemia: Secondary | ICD-10-CM | POA: Insufficient documentation

## 2023-03-06 DIAGNOSIS — E3400 Carcinoid syndrome, unspecified: Secondary | ICD-10-CM | POA: Insufficient documentation

## 2023-03-06 DIAGNOSIS — I4891 Unspecified atrial fibrillation: Secondary | ICD-10-CM | POA: Insufficient documentation

## 2023-03-06 DIAGNOSIS — R197 Diarrhea, unspecified: Secondary | ICD-10-CM | POA: Diagnosis not present

## 2023-03-06 DIAGNOSIS — K6389 Other specified diseases of intestine: Secondary | ICD-10-CM

## 2023-03-06 LAB — CMP (CANCER CENTER ONLY)
ALT: 13 U/L (ref 0–44)
AST: 21 U/L (ref 15–41)
Albumin: 3.6 g/dL (ref 3.5–5.0)
Alkaline Phosphatase: 76 U/L (ref 38–126)
Anion gap: 8 (ref 5–15)
BUN: 20 mg/dL (ref 8–23)
CO2: 24 mmol/L (ref 22–32)
Calcium: 9.2 mg/dL (ref 8.9–10.3)
Chloride: 102 mmol/L (ref 98–111)
Creatinine: 0.84 mg/dL (ref 0.44–1.00)
GFR, Estimated: >60 mL/min (ref >60)
Glucose, Bld: 129 mg/dL — ABNORMAL HIGH (ref 70–99)
Potassium: 3.4 mmol/L — ABNORMAL LOW (ref 3.5–5.1)
Sodium: 134 mmol/L — ABNORMAL LOW (ref 135–145)
Total Bilirubin: 0.7 mg/dL (ref 0.0–1.2)
Total Protein: 6.4 g/dL — ABNORMAL LOW (ref 6.5–8.1)

## 2023-03-06 LAB — CBC WITH DIFFERENTIAL (CANCER CENTER ONLY)
Abs Immature Granulocytes: 0.03 10*3/uL (ref 0.00–0.07)
Basophils Absolute: 0.1 10*3/uL (ref 0.0–0.1)
Basophils Relative: 1 %
Eosinophils Absolute: 0.3 10*3/uL (ref 0.0–0.5)
Eosinophils Relative: 5 %
HCT: 31.3 % — ABNORMAL LOW (ref 36.0–46.0)
Hemoglobin: 10 g/dL — ABNORMAL LOW (ref 12.0–15.0)
Immature Granulocytes: 1 %
Lymphocytes Relative: 7 %
Lymphs Abs: 0.4 10*3/uL — ABNORMAL LOW (ref 0.7–4.0)
MCH: 30.9 pg (ref 26.0–34.0)
MCHC: 31.9 g/dL (ref 30.0–36.0)
MCV: 96.6 fL (ref 80.0–100.0)
Monocytes Absolute: 0.7 10*3/uL (ref 0.1–1.0)
Monocytes Relative: 12 %
Neutro Abs: 4.8 10*3/uL (ref 1.7–7.7)
Neutrophils Relative %: 74 %
Platelet Count: 195 10*3/uL (ref 150–400)
RBC: 3.24 MIL/uL — ABNORMAL LOW (ref 3.87–5.11)
RDW: 14.4 % (ref 11.5–15.5)
WBC Count: 6.3 10*3/uL (ref 4.0–10.5)
nRBC: 0 % (ref 0.0–0.2)

## 2023-03-06 LAB — VITAMIN B12: Vitamin B-12: 556 pg/mL (ref 180–914)

## 2023-03-06 MED ORDER — CYANOCOBALAMIN 1000 MCG/ML IJ SOLN
1000.0000 ug | Freq: Once | INTRAMUSCULAR | Status: AC
  Administered 2023-03-06: 1000 ug via INTRAMUSCULAR
  Filled 2023-03-06: qty 1

## 2023-03-06 MED ORDER — IRON SUCROSE 20 MG/ML IV SOLN
200.0000 mg | Freq: Once | INTRAVENOUS | Status: AC
  Administered 2023-03-06: 200 mg via INTRAVENOUS

## 2023-03-06 MED ORDER — OCTREOTIDE ACETATE 30 MG IM KIT
30.0000 mg | PACK | Freq: Once | INTRAMUSCULAR | Status: AC
Start: 2023-03-06 — End: 2023-03-06
  Administered 2023-03-06: 30 mg via INTRAMUSCULAR
  Filled 2023-03-06: qty 1

## 2023-03-06 NOTE — Patient Instructions (Signed)
 Iron  Sucrose Injection What is this medication? IRON  SUCROSE (EYE ern SOO krose) treats low levels of iron  (iron  deficiency anemia) in people with kidney disease. Iron  is a mineral that plays an important role in making red blood cells, which carry oxygen from your lungs to the rest of your body. This medicine may be used for other purposes; ask your health care provider or pharmacist if you have questions. COMMON BRAND NAME(S): Venofer  What should I tell my care team before I take this medication? They need to know if you have any of these conditions: Anemia not caused by low iron  levels Heart disease High levels of iron  in the blood Kidney disease Liver disease An unusual or allergic reaction to iron , other medications, foods, dyes, or preservatives Pregnant or trying to get pregnant Breastfeeding How should I use this medication? This medication is infused into a vein. It is given by your care team in a hospital or clinic setting. Talk to your care team about the use of this medication in children. While it may be prescribed for children as young as 2 years for selected conditions, precautions do apply. Overdosage: If you think you have taken too much of this medicine contact a poison control center or emergency room at once. NOTE: This medicine is only for you. Do not share this medicine with others. What if I miss a dose? Keep appointments for follow-up doses. It is important not to miss your dose. Call your care team if you are unable to keep an appointment. What may interact with this medication? Do not take this medication with any of the following: Deferoxamine Dimercaprol Other iron  products This medication may also interact with the following: Chloramphenicol Deferasirox This list may not describe all possible interactions. Give your health care provider a list of all the medicines, herbs, non-prescription drugs, or dietary supplements you use. Also tell them if you smoke,  drink alcohol, or use illegal drugs. Some items may interact with your medicine. What should I watch for while using this medication? Visit your care team for regular checks on your progress. Tell your care team if your symptoms do not start to get better or if they get worse. You may need blood work done while you are taking this medication. You may need to eat more foods that contain iron . Talk to your care team. Foods that contain iron  include whole grains or cereals, dried fruits, beans, peas, leafy green vegetables, and organ meats (liver, kidney). What side effects may I notice from receiving this medication? Side effects that you should report to your care team as soon as possible: Allergic reactions--skin rash, itching, hives, swelling of the face, lips, tongue, or throat Low blood pressure--dizziness, feeling faint or lightheaded, blurry vision Shortness of breath Side effects that usually do not require medical attention (report to your care team if they continue or are bothersome): Flushing Headache Joint pain Muscle pain Nausea Pain, redness, or irritation at injection site This list may not describe all possible side effects. Call your doctor for medical advice about side effects. You may report side effects to FDA at 1-800-FDA-1088. Where should I keep my medication? This medication is given in a hospital or clinic. It will not be stored at home. NOTE: This sheet is a summary. It may not cover all possible information. If you have questions about this medicine, talk to your doctor, pharmacist, or health care provider.  2024 Elsevier/Gold Standard (2022-09-03 00:00:00)  Vitamin B12 Injection What is this medication?  Vitamin B12 (VAHY tuh min B12) prevents and treats low vitamin B12 levels in your body. It is used in people who do not get enough vitamin B12 from their diet or when their digestive tract does not absorb enough. Vitamin B12 plays an important role in maintaining  the health of your nervous system and red blood cells. This medicine may be used for other purposes; ask your health care provider or pharmacist if you have questions. COMMON BRAND NAME(S): B-12 Compliance Kit, B-12 Injection Kit, Cyomin, Dodex , LA-12, Nutri-Twelve, Physicians EZ Use B-12, Primabalt, Vitamin Deficiency Injectable System - B12 What should I tell my care team before I take this medication? They need to know if you have any of these conditions: Kidney disease Leber's disease Megaloblastic anemia An unusual or allergic reaction to cyanocobalamin , cobalt, other medications, foods, dyes, or preservatives Pregnant or trying to get pregnant Breast-feeding How should I use this medication? This medication is injected into a muscle or deeply under the skin. It is usually given in a clinic or care team's office. However, your care team may teach you how to inject yourself. Follow all instructions. Talk to your care team about the use of this medication in children. Special care may be needed. Overdosage: If you think you have taken too much of this medicine contact a poison control center or emergency room at once. NOTE: This medicine is only for you. Do not share this medicine with others. What if I miss a dose? If you are given your dose at a clinic or care team's office, call to reschedule your appointment. If you give your own injections, and you miss a dose, take it as soon as you can. If it is almost time for your next dose, take only that dose. Do not take double or extra doses. What may interact with this medication? Alcohol Colchicine This list may not describe all possible interactions. Give your health care provider a list of all the medicines, herbs, non-prescription drugs, or dietary supplements you use. Also tell them if you smoke, drink alcohol, or use illegal drugs. Some items may interact with your medicine. What should I watch for while using this medication? Visit your  care team regularly. You may need blood work done while you are taking this medication. You may need to follow a special diet. Talk to your care team. Limit your alcohol intake and avoid smoking to get the best benefit. What side effects may I notice from receiving this medication? Side effects that you should report to your care team as soon as possible: Allergic reactions--skin rash, itching, hives, swelling of the face, lips, tongue, or throat Swelling of the ankles, hands, or feet Trouble breathing Side effects that usually do not require medical attention (report to your care team if they continue or are bothersome): Diarrhea This list may not describe all possible side effects. Call your doctor for medical advice about side effects. You may report side effects to FDA at 1-800-FDA-1088. Where should I keep my medication? Keep out of the reach of children. Store at room temperature between 15 and 30 degrees C (59 and 85 degrees F). Protect from light. Throw away any unused medication after the expiration date. NOTE: This sheet is a summary. It may not cover all possible information. If you have questions about this medicine, talk to your doctor, pharmacist, or health care provider.  2024 Elsevier/Gold Standard (2020-09-25 00:00:00)  Octreotide  Injection Solution What is this medication? OCTREOTIDE  (ok TREE oh tide) treats  high levels of growth hormone (acromegaly). It works by reducing the amount of growth hormone your body makes. This reduces symptoms and the risk of health problems caused by too much growth hormone, such as diabetes and heart disease. It may also be used to treat diarrhea caused by neuroendocrine tumors. It works by slowing down the release of serotonin from the tumor cells. This reduces the number of bowel movements you have. This medicine may be used for other purposes; ask your health care provider or pharmacist if you have questions. COMMON BRAND NAME(S): Bynfezia,  Sandostatin  What should I tell my care team before I take this medication? They need to know if you have any of these conditions: Diabetes Gallbladder disease Heart disease Kidney disease Liver disease Pancreatic disease Thyroid  disease An unusual or allergic reaction to octreotide , other medications, foods, dyes, or preservatives Pregnant or trying to get pregnant Breastfeeding How should I use this medication? This medication is injected under the skin or into a vein. It is usually given by your care team in a hospital or clinic setting. If you get this medication at home, you will be taught how to prepare and give it. Use exactly as directed. Take it as directed on the prescription label at the same time every day. Keep taking it unless your care team tells you to stop. Allow the injection solution to come to room temperature before use. Do not warm it artificially. It is important that you put your used needles and syringes in a special sharps container. Do not put them in a trash can. If you do not have a sharps container, call your pharmacist or care team to get one. Talk to your care team about the use of this medication in children. Special care may be needed. Overdosage: If you think you have taken too much of this medicine contact a poison control center or emergency room at once. NOTE: This medicine is only for you. Do not share this medicine with others. What if I miss a dose? If you miss a dose, take it as soon as you can. If it is almost time for your next dose, take only that dose. Do not take double or extra doses. What may interact with this medication? Bromocriptine Certain medications for blood pressure, heart disease, irregular heartbeat Cyclosporine Diuretics Medications for diabetes, including insulin Quinidine This list may not describe all possible interactions. Give your health care provider a list of all the medicines, herbs, non-prescription drugs, or dietary  supplements you use. Also tell them if you smoke, drink alcohol, or use illegal drugs. Some items may interact with your medicine. What should I watch for while using this medication? Visit your care team for regular checks on your progress. Tell your care team if your symptoms do not start to get better or if they get worse. To help reduce irritation at the injection site, use a different site for each injection and make sure the solution is at room temperature before use. This medication may cause decreases in blood sugar. Signs of low blood sugar include chills, cool, pale skin or cold sweats, drowsiness, extreme hunger, fast heartbeat, headache, nausea, nervousness or anxiety, shakiness, trembling, unsteadiness, tiredness, or weakness. Contact your care team right away if you experience any of these symptoms. This medication may increase blood sugar. The risk may be higher in patients who already have diabetes. Ask your care team what you can do to lower your risk of diabetes while taking this medication. You  should make sure you get enough vitamin B12 while you are taking this medication. Discuss the foods you eat and the vitamins you take with your care team. What side effects may I notice from receiving this medication? Side effects that you should report to your care team as soon as possible: Allergic reactions--skin rash, itching, hives, swelling of the face, lips, tongue, or throat Gallbladder problems--severe stomach pain, nausea, vomiting, fever Heart rhythm changes--fast or irregular heartbeat, dizziness, feeling faint or lightheaded, chest pain, trouble breathing High blood sugar (hyperglycemia)--increased thirst or amount of urine, unusual weakness or fatigue, blurry vision Low blood sugar (hypoglycemia)--tremors or shaking, anxiety, sweating, cold or clammy skin, confusion, dizziness, rapid heartbeat Low thyroid  levels (hypothyroidism)--unusual weakness or fatigue, increased sensitivity  to cold, constipation, hair loss, dry skin, weight gain, feelings of depression Low vitamin B12 level--pain, tingling, or numbness in the hands or feet, muscle weakness, dizziness, confusion, trouble concentrating Oily or light-colored stools, diarrhea, bloating, weight loss Pancreatitis--severe stomach pain that spreads to your back or gets worse after eating or when touched, fever, nausea, vomiting Slow heartbeat--dizziness, feeling faint or lightheaded, confusion, trouble breathing, unusual weakness or fatigue Side effects that usually do not require medical attention (report these to your care team if they continue or are bothersome): Diarrhea Dizziness Headache Nausea Pain, redness, or irritation at injection site Stomach pain This list may not describe all possible side effects. Call your doctor for medical advice about side effects. You may report side effects to FDA at 1-800-FDA-1088. Where should I keep my medication? Keep out of the reach of children and pets. Store in the refrigerator. Protect from light. Allow to come to room temperature naturally. Do not use artificial heat. If protected from light, the injection may be stored between 20 and 30 degrees C (70 and 86 degrees F) for 14 days. After the initial use, throw away any unused portion of a multiple dose vial after 14 days. Get rid of any unused portions of the ampules after use. To get rid of medications that are no longer needed or have expired: Take the medication to a medication take-back program. Ask your pharmacy or law enforcement to find a location. If you cannot return the medication, ask your pharmacist or care team how to get rid of the medication safely. NOTE: This sheet is a summary. It may not cover all possible information. If you have questions about this medicine, talk to your doctor, pharmacist, or health care provider.  2024 Elsevier/Gold Standard (2022-12-26 00:00:00)

## 2023-03-06 NOTE — Progress Notes (Signed)
 Patient has no concerns

## 2023-03-06 NOTE — Progress Notes (Signed)
 [name] OFFICE PROGRESS NOTE  Patient Care Team: Valora Agent, [name] Donette Ellouise LABOR, [name] Ammon Blunt, [name] Epifanio Alm SQUIBB, [name] Rennie Cindy SAUNDERS, [name]   Cancer Staging  No matching staging information was found for the patient.    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   # FEB 2018- Ga PET- uptake in liver/ mesenteric mass; NOV 2019- increase sandostatin  to 30mg  q monthly.   #S/p Lutathera  x4 every 2 months; last treatment mid September 2022 [Dr.edmunds]; OCT 2022- PET STABLE.   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  In a wheelchair.  Alone.   Emma [name] 83 y.o.  female pleasant patient above history of metastatic carcinoid tumor currently on Sandostatin /; s/p Lutathera  infusions every 2 months ; iron  deficient anemia question AV malformation; A-fib on Eliquis  is here for follow-up/ and review the results of the PET scan.  Patient has chronic fatigue not any worse.  Chronic mild diarrhea not any worse.  Intermittent blood in stools.  No nausea no vomiting.  Ambulates at home with a walker. In wheelchair at clinic    Review of Systems  Constitutional:  Positive for malaise/fatigue. Negative for chills, diaphoresis, fever and weight loss.  HENT:  Negative for nosebleeds and sore throat.   Eyes:  Negative for double vision.  Respiratory:  Negative for hemoptysis, sputum production, shortness of breath and wheezing.   Cardiovascular:  Negative for chest pain, palpitations and orthopnea.  Gastrointestinal:  Positive for abdominal pain and diarrhea. Negative for constipation, heartburn, melena, nausea and vomiting.  Genitourinary:  Negative for dysuria, frequency and urgency.  Musculoskeletal:  Positive for back pain. Negative for joint pain.  Skin: Negative.  Negative for itching and rash.  Neurological:  Negative for dizziness, tingling, focal weakness, weakness and headaches.  Endo/Heme/Allergies:  Does not bruise/bleed easily.  Psychiatric/Behavioral:  Negative for depression. The patient is not nervous/anxious and does not have insomnia.       PAST MEDICAL HISTORY :  Past Medical History:  Diagnosis Date   Aromatase inhibitor use    Atrial fibrillation (HCC)    Breast cancer (HCC)  2014   left breast cancer/radiation   Breast cancer, left breast (HCC) 06/19/2014   CHF (congestive heart failure) (HCC)    recent sepsis   Chronic kidney disease    Dizziness    Dyspnea    recently  while admitted   GERD (gastroesophageal reflux disease)    Hearing loss    History of hiatal hernia    History of kidney stones    HOH (hard of hearing)    severe   Hypertension    Hypothyroidism    Leg DVT (deep venous thromboembolism), acute (HCC) 07/2015   left leg after fracture   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   Pain    chest pain while admitted   Personal history of radiation therapy 2015   left breast ca   Thyroid  disease     PAST SURGICAL HISTORY :   Past Surgical History:  Procedure Laterality Date   ABDOMINAL HYSTERECTOMY     partial   BREAST LUMPECTOMY Left 01/13/2013   invasive mammary carcinoma, clear margins, LN negative   BREAST LUMPECTOMY WITH SENTINEL LYMPH NODE BIOPSY Left 2014   CHOLECYSTECTOMY     COLONOSCOPY WITH PROPOFOL  N/A 09/17/2017   Procedure: COLONOSCOPY WITH PROPOFOL ;  Surgeon: Therisa Bi, MD;  Location: [facility] ENDOSCOPY;  Service: Gastroenterology;  Laterality: N/A;   CYSTOSCOPY W/ URETERAL STENT PLACEMENT Right 01/07/2016   Procedure: CYSTOSCOPY WITH STENT REPLACEMENT;  Surgeon: Rosina Riis, MD;  Location: [facility] ORS;  Service: Urology;  Laterality: Right;   CYSTOSCOPY WITH RETROGRADE PYELOGRAM, URETEROSCOPY AND STENT PLACEMENT Right 12/19/2015   Procedure: CYSTOSCOPY WITH RETROGRADE PYELOGRAM, URETEROSCOPY AND STENT PLACEMENT;  Surgeon: Morene LELON Salines, MD;  Location: [facility] ORS;  Service: Urology;  Laterality: Right;   URETEROSCOPY WITH HOLMIUM LASER LITHOTRIPSY Right 01/07/2016   Procedure: URETEROSCOPY WITH HOLMIUM LASER LITHOTRIPSY;  Surgeon: Rosina Riis, MD;  Location: [facility] ORS;  Service: Urology;  Laterality: Right;    FAMILY HISTORY :   Family History  Problem Relation Age of Onset   Breast cancer Mother 37       deceased 31   Breast cancer Maternal Aunt 74       deceased 37s   Prostate cancer Father        deceased 38   Colon cancer Paternal Aunt 59       deceased 58s    SOCIAL HISTORY:   Social History   Tobacco Use    Smoking status: Never   Smokeless tobacco: Never  Vaping Use   Vaping status: Never Used  Substance Use Topics   Alcohol use: No    Alcohol/week: 0.0 standard drinks of alcohol   Drug use: No    ALLERGIES:  is allergic to sulfa antibiotics.  MEDICATIONS:  Current Outpatient Medications  Medication Sig Dispense Refill   apixaban  (ELIQUIS ) 5 MG TABS tablet Take 1 tablet (5 mg total) by mouth 2 (two) times daily. 60 tablet 0   aspirin  EC 81 MG tablet Take 81 mg by mouth daily as needed. Pt reports taking if she feels her heart flutter     diltiazem  (CARDIZEM  CD) 180 MG 24 hr capsule Take 1 capsule (180 mg total) by mouth daily. 30 capsule 0   diltiazem  (TIADYLT  ER) 120 MG 24 hr capsule Take 120 mg by mouth daily.     furosemide  (LASIX ) 40 MG tablet Take 40 mg by mouth daily.     KLOR-CON  M20 20 MEQ tablet TAKE 1 TABLET BY MOUTH EVERY DAY 90 tablet 0   labetalol  (  NORMODYNE ) 100 MG tablet Take 100 mg by mouth 2 (two) times daily.     levothyroxine  (SYNTHROID ) 88 MCG tablet Take 88 mcg by mouth daily before breakfast.      losartan -hydrochlorothiazide  (HYZAAR) 100-25 MG tablet Take 1 tablet by mouth daily.     nystatin  (MYCOSTATIN /NYSTOP ) powder APPLY TO AFFECTED AREA TWICE A DAY 15 g 3   pantoprazole  (PROTONIX ) 40 MG tablet TAKE 1 TABLET BY MOUTH EVERY DAY 90 tablet 0   traMADol  (ULTRAM ) 50 MG tablet 0.5 tablet to 1 tablet every 8 hours as needed for pain 30 tablet 0   Vitamin D , Ergocalciferol , (DRISDOL ) 1.25 MG (50000 UNIT) CAPS capsule TAKE 1 CAPSULE BY MOUTH ONE TIME PER WEEK 12 capsule 3   No current facility-administered medications for this visit.   Facility-Administered Medications Ordered in Other Visits  Medication Dose Route Frequency Provider Last Rate Last Admin   cyanocobalamin  (VITAMIN B12) injection 1,000 mcg  1,000 mcg Intramuscular Once Eleny Cortez R, MD       iron  sucrose (VENOFER ) injection 200 mg  200 mg Intravenous Once Allen, Lauren G, NP        octreotide  (SANDOSTATIN  LAR) 30 MG IM injection            octreotide  (SANDOSTATIN  LAR) IM injection 30 mg  30 mg Intramuscular Once Rickiya Picariello R, MD        PHYSICAL EXAMINATION: ECOG PERFORMANCE STATUS: 1 - Symptomatic but completely ambulatory  BP (!) 130/56   Pulse (!) 59   Temp 97.8 F (36.6 C)   Resp 19   Wt 205 lb 4.8 oz (93.1 kg)   SpO2 97%   BMI 34.16 kg/m   Filed Weights   03/06/23 1530  Weight: 205 lb 4.8 oz (93.1 kg)         Physical Exam HENT:     Head: Normocephalic and atraumatic.     Mouth/Throat:     Pharynx: No oropharyngeal exudate.  Eyes:     Pupils: Pupils are equal, round, and reactive to light.  Cardiovascular:     Rate and Rhythm: Normal rate. Rhythm irregular.  Pulmonary:     Effort: Pulmonary effort is normal. No respiratory distress.     Breath sounds: Normal breath sounds. No wheezing.  Abdominal:     General: Bowel sounds are normal. There is no distension.     Palpations: Abdomen is soft. There is no mass.     Tenderness: There is no abdominal tenderness. There is no guarding or rebound.  Musculoskeletal:        General: Swelling present. No tenderness. Normal range of motion.     Cervical back: Normal range of motion and neck supple.  Skin:    General: Skin is warm.  Neurological:     Mental Status: She is alert and oriented to person, place, and time.  Psychiatric:        Mood and Affect: Affect normal.     LABORATORY DATA:  I have reviewed the data as listed    Component Value Date/Time   NA 134 (L) 03/06/2023 1508   NA 137 07/12/2013 0841   K 3.4 (L) 03/06/2023 1508   K 4.7 07/12/2013 0841   CL 102 03/06/2023 1508   CL 107 07/12/2013 0841   CO2 24 03/06/2023 1508   CO2 20 (L) 07/12/2013 0841   GLUCOSE 129 (H) 03/06/2023 1508   GLUCOSE 122 (H) 07/12/2013 0841   BUN 20 03/06/2023 1508   BUN 18 07/12/2013  0841   CREATININE 0.84 03/06/2023 1508   CREATININE 0.64 05/22/2014 1111   CALCIUM 9.2 03/06/2023 1508    CALCIUM 9.2 07/12/2013 0841   PROT 6.4 (L) 03/06/2023 1508   PROT 7.0 05/22/2014 1111   ALBUMIN 3.6 03/06/2023 1508   ALBUMIN 4.2 05/22/2014 1111   AST 21 03/06/2023 1508   ALT 13 03/06/2023 1508   ALT 17 05/22/2014 1111   ALKPHOS 76 03/06/2023 1508   ALKPHOS 95 05/22/2014 1111   BILITOT 0.7 03/06/2023 1508   GFRNONAA >60 03/06/2023 1508   GFRNONAA >60 05/22/2014 1111   GFRAA >60 10/07/2019 1319   GFRAA >60 05/22/2014 1111    No results found for: SPEP, UPEP  Lab Results  Component Value Date   WBC 6.3 03/06/2023   NEUTROABS 4.8 03/06/2023   HGB 10.0 (L) 03/06/2023   HCT 31.3 (L) 03/06/2023   MCV 96.6 03/06/2023   PLT 195 03/06/2023      Chemistry      Component Value Date/Time   NA 134 (L) 03/06/2023 1508   NA 137 07/12/2013 0841   K 3.4 (L) 03/06/2023 1508   K 4.7 07/12/2013 0841   CL 102 03/06/2023 1508   CL 107 07/12/2013 0841   CO2 24 03/06/2023 1508   CO2 20 (L) 07/12/2013 0841   BUN 20 03/06/2023 1508   BUN 18 07/12/2013 0841   CREATININE 0.84 03/06/2023 1508   CREATININE 0.64 05/22/2014 1111      Component Value Date/Time   CALCIUM 9.2 03/06/2023 1508   CALCIUM 9.2 07/12/2013 0841   ALKPHOS 76 03/06/2023 1508   ALKPHOS 95 05/22/2014 1111   AST 21 03/06/2023 1508   ALT 13 03/06/2023 1508   ALT 17 05/22/2014 1111   BILITOT 0.7 03/06/2023 1508       RADIOGRAPHIC STUDIES: I have personally reviewed the radiological images as listed and agreed with the findings in the report. No results found.   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 Echo March 2023- WNL; NO right sided abnormalities.  #Continue Sandostatin  q M.  No obvious clinical evidence of progression of disease noticed.  labs today reviewed; see below re: anemia.   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  Declined GI appointment in past.  stable  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   # Hypokalemia-potassium 3.3; secondary to diuretics- -stable.   ## A.fib 2023; March- on eliquis  [Dr.Parashoes Cardiologist];  Monitor closely given history of rectal bleeding--stable.   # b12 def- STOP oral B12 supplementation; proceed with B12 injections once monthly.   # Okay to hold off jury duty given patient's-ongoing above multiple medical problems.  # DISPOSITION- #  Ok with Sandostatin  Venofer ; and B12 injection #  in 1 month b12 -Sandostatin ;  # Follow up in  2 months-  MD; labs-- B12 levels; cbc/cmp/ chromogranin; iron  studies'feritin;  b12 injection -Sandostatin ; possible venofer -  Dr.B      No orders of the defined types were placed in this encounter.   All questions were answered. The patient knows to call the clinic with any problems, questions or concerns.      Cindy JONELLE Joe, MD 03/06/2023 4:12 PM

## 2023-03-06 NOTE — Progress Notes (Signed)
 P

## 2023-03-06 NOTE — Assessment & Plan Note (Addendum)
#   Non- functional Small bowel/mesenteric carcinoid; s/p Lutathera  + sandostatin  x4 infusions.  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 Echo March 2023- WNL; NO right sided abnormalities.  #Continue Sandostatin  q M.  No obvious clinical evidence of progression of disease noticed.  labs today reviewed; see below re: anemia.   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  Declined GI appointment in past.  stable  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   # Hypokalemia-potassium 3.3; secondary to diuretics- -stable.   ## A.fib 2023; March- on eliquis  [Dr.Parashoes Cardiologist];  Monitor closely given history of rectal bleeding--stable.   # b12 def- STOP oral B12 supplementation; proceed with B12 injections once monthly.   # Okay to hold off jury duty given patient's-ongoing above multiple medical problems.  # DISPOSITION- #  Ok with Sandostatin  Venofer ; and B12 injection #  in 1 month b12 -Sandostatin ;  # Follow up in  2 months-  MD; labs-- B12 levels; cbc/cmp/ chromogranin; iron  studies'feritin;   b12 injection -Sandostatin ; possible venofer -  Dr.B

## 2023-03-08 LAB — CHROMOGRANIN A: Chromogranin A (ng/mL): 115.6 ng/mL — ABNORMAL HIGH (ref 0.0–101.8)

## 2023-03-13 ENCOUNTER — Emergency Department
Admission: EM | Admit: 2023-03-13 | Discharge: 2023-03-13 | Disposition: A | Discharge status: Home / Self Care | Payer: Medicare Other | Attending: Emergency Medicine | Admitting: Emergency Medicine

## 2023-03-13 ENCOUNTER — Emergency Department: Payer: Medicare Other

## 2023-03-13 ENCOUNTER — Encounter: Payer: Self-pay | Admitting: Emergency Medicine

## 2023-03-13 ENCOUNTER — Other Ambulatory Visit: Payer: Self-pay

## 2023-03-13 DIAGNOSIS — R008 Other abnormalities of heart beat: Secondary | ICD-10-CM | POA: Insufficient documentation

## 2023-03-13 DIAGNOSIS — I4891 Unspecified atrial fibrillation: Secondary | ICD-10-CM | POA: Diagnosis not present

## 2023-03-13 DIAGNOSIS — E039 Hypothyroidism, unspecified: Secondary | ICD-10-CM | POA: Insufficient documentation

## 2023-03-13 DIAGNOSIS — R002 Palpitations: Secondary | ICD-10-CM | POA: Diagnosis not present

## 2023-03-13 DIAGNOSIS — Z7901 Long term (current) use of anticoagulants: Secondary | ICD-10-CM | POA: Insufficient documentation

## 2023-03-13 DIAGNOSIS — C7A019 Malignant carcinoid tumor of the small intestine, unspecified portion: Secondary | ICD-10-CM | POA: Diagnosis not present

## 2023-03-13 DIAGNOSIS — I509 Heart failure, unspecified: Secondary | ICD-10-CM | POA: Diagnosis not present

## 2023-03-13 LAB — MAGNESIUM: Magnesium: 1.8 mg/dL (ref 1.7–2.4)

## 2023-03-13 LAB — CBC
HCT: 32.2 % — ABNORMAL LOW (ref 36.0–46.0)
Hemoglobin: 10.4 g/dL — ABNORMAL LOW (ref 12.0–15.0)
MCH: 31.3 pg (ref 26.0–34.0)
MCHC: 32.3 g/dL (ref 30.0–36.0)
MCV: 97 fL (ref 80.0–100.0)
Platelets: 217 10*3/uL (ref 150–400)
RBC: 3.32 MIL/uL — ABNORMAL LOW (ref 3.87–5.11)
RDW: 14.7 % (ref 11.5–15.5)
WBC: 5.8 10*3/uL (ref 4.0–10.5)
nRBC: 0 % (ref 0.0–0.2)

## 2023-03-13 LAB — BASIC METABOLIC PANEL
Anion gap: 10 (ref 5–15)
BUN: 20 mg/dL (ref 8–23)
CO2: 22 mmol/L (ref 22–32)
Calcium: 9.3 mg/dL (ref 8.9–10.3)
Chloride: 107 mmol/L (ref 98–111)
Creatinine, Ser: 0.85 mg/dL (ref 0.44–1.00)
GFR, Estimated: >60 mL/min (ref >60)
Glucose, Bld: 137 mg/dL — ABNORMAL HIGH (ref 70–99)
Potassium: 3.3 mmol/L — ABNORMAL LOW (ref 3.5–5.1)
Sodium: 139 mmol/L (ref 135–145)

## 2023-03-13 LAB — PROTIME-INR
INR: 1.3 — ABNORMAL HIGH (ref 0.8–1.2)
Prothrombin Time: 16.2 s — ABNORMAL HIGH (ref 11.4–15.2)

## 2023-03-13 LAB — TSH: TSH: 1.672 u[IU]/mL (ref 0.350–4.500)

## 2023-03-13 LAB — TROPONIN I (HIGH SENSITIVITY): Troponin I (High Sensitivity): 12 ng/L (ref <18)

## 2023-03-13 LAB — T4, FREE: Free T4: 0.93 ng/dL (ref 0.61–1.12)

## 2023-03-13 NOTE — Discharge Instructions (Signed)
Fortunately your Emergency Department evaluation did not show any emergency conditions that account for your symptoms.  Continue taking all medications as prescribed and follow-up with your cardiologist Dr. Darrold Junker next week.   Thank you for choosing [place] for your health care today!  Please see your primary doctor this week for a follow up appointment.   If you have any new, worsening, or unexpected symptoms call your doctor right away or come back to the emergency department for reevaluation.  It was my pleasure to care for you today.   Daneil Dan Modesto Charon, MD

## 2023-03-13 NOTE — ED Provider Notes (Signed)
[facility] Provider Note    Event Date/Time   First MD Initiated Contact with Patient 03/13/23 [phone]     (approximate)   History   Abnormal Heart Rate    HPI  Emma [name] is a 83 y.o. female   Past medical history of atrial fibrillation on Eliquis, CHF, hypothyroid, who presents emergency department with palpitations.  Intermittently on and off throughout the day.  None currently.  No chest pain.  No other acute medical complaints.  She has been fully compliant with all medications including her Eliquis.  No recent changes in medications.  She feels well currently.  Independent Historian contributed to assessment above: Her daughter is at bedside to corroborate information past medical history as above     Physical Exam   Triage Vital Signs: ED Triage Vitals  Encounter Vitals Group     BP 03/13/23 0051 123/65     Systolic BP Percentile --      Diastolic BP Percentile --      Pulse Rate 03/13/23 0051 76     Resp 03/13/23 0051 18     Temp 03/13/23 0051 97.7 F (36.5 C)     Temp Source 03/13/23 0051 Oral     SpO2 03/13/23 0051 96 %     Weight 03/13/23 0048 205 lb 0.4 oz (93 kg)     Height 03/13/23 0048 5\' 5"  (1.651 m)     Head Circumference --      Peak Flow --      Pain Score --      Pain Loc --      Pain Education --      Exclude from Growth Chart --     Most recent vital signs: Vitals:   03/13/23 0051  BP: 123/65  Pulse: 76  Resp: 18  Temp: 97.7 F (36.5 C)  SpO2: 96%    General: Awake, no distress.  CV:  Good peripheral perfusion.  Resp:  Normal effort.  Abd:  No distention.  Other:  Hard of hearing.  Comfortable appearing.  Normal vital signs.  Normal heart sounds rate and rhythm no murmurs, clear lungs, appears euvolemic overall.   ED Results / Procedures / Treatments   Labs (all labs ordered are listed, but only abnormal results are displayed) Labs Reviewed  BASIC METABOLIC PANEL - Abnormal; Notable for the  following components:      Result Value   Potassium 3.3 (*)    Glucose, Bld 137 (*)    All other components within normal limits  CBC - Abnormal; Notable for the following components:   RBC 3.32 (*)    Hemoglobin 10.4 (*)    HCT 32.2 (*)    All other components within normal limits  PROTIME-INR - Abnormal; Notable for the following components:   Prothrombin Time 16.2 (*)    INR 1.3 (*)    All other components within normal limits  TSH  T4, FREE  MAGNESIUM  TROPONIN I (HIGH SENSITIVITY)     I ordered and reviewed the above labs they are notable for cell counts electrolytes and thyroid studies unremarkable.  Initial troponin is 12 compared to baseline slightly higher.  EKG  ED ECG REPORT I, Pilar Jarvis, the attending physician, personally viewed and interpreted this ECG.   Date: 03/13/2023  EKG Time: 0048  Rate: 75  Rhythm: sinus  Axis: nl  Intervals:none  ST&T Change: no stemi  RADIOLOGY I independently reviewed and interpreted cxr and see no  obvious focality or pneumothorax I also reviewed radiologist's formal read.   PROCEDURES:  Critical Care performed: No  Procedures   MEDICATIONS ORDERED IN ED: Medications - No data to display    IMPRESSION / MDM / ASSESSMENT AND PLAN / ED COURSE  I reviewed the triage vital signs and the nursing notes.                                Patient's presentation is most consistent with acute presentation with potential threat to life or bodily function.  Differential diagnosis includes, but is not limited to, arrhythmia, electrolyte derangement, thyroid abnormality, considered but doubt cardiopulmonary emergencies like dissection, PE, ACS   MDM: Is a patient with intermittent palpitations and history of atrial fibrillation compliant with all medications and properly anticoagulated.  She is asymptomatic now.  These episodes of been very transient lasting seconds at a time.  It may be that she gets some runs of A-fib with  RVR but certainly none right now as she is stable and asymptomatic.  Check labs electrolytes troponin, thyroid studies and all are unremarkable she remains stable and asymptomatic and so I think she can go home at this time and follow-up with her cardiologist.      FINAL CLINICAL IMPRESSION(S) / ED DIAGNOSES   Final diagnoses:  Palpitations     Rx / DC Orders   ED Discharge Orders     None        Note:  This document was prepared using Dragon voice recognition software and may include unintentional dictation errors.    Pilar Jarvis, MD 03/13/23 [phone]

## 2023-03-13 NOTE — ED Triage Notes (Signed)
Patient from home accompanied by daughter, patient very hard of hearing and poor historian. Per the daughter patient is experiencing heart arrythmias since 4 pm today. Patient regularly records HR and BP. Per daughter, patient hasn't mentioned any chest pain or shortness of breath, but daughter reports she did not come out to eat dinner tonight.

## 2023-04-03 ENCOUNTER — Inpatient Hospital Stay: Payer: Medicare Other | Attending: Internal Medicine

## 2023-04-03 DIAGNOSIS — E538 Deficiency of other specified B group vitamins: Secondary | ICD-10-CM | POA: Insufficient documentation

## 2023-04-03 DIAGNOSIS — E3400 Carcinoid syndrome, unspecified: Secondary | ICD-10-CM | POA: Diagnosis not present

## 2023-04-03 DIAGNOSIS — C7B02 Secondary carcinoid tumors of liver: Secondary | ICD-10-CM | POA: Insufficient documentation

## 2023-04-03 DIAGNOSIS — C7A019 Malignant carcinoid tumor of the small intestine, unspecified portion: Secondary | ICD-10-CM | POA: Insufficient documentation

## 2023-04-03 DIAGNOSIS — C7A8 Other malignant neuroendocrine tumors: Secondary | ICD-10-CM

## 2023-04-03 DIAGNOSIS — K6389 Other specified diseases of intestine: Secondary | ICD-10-CM

## 2023-04-03 MED ORDER — OCTREOTIDE ACETATE 30 MG IM KIT
30.0000 mg | PACK | Freq: Once | INTRAMUSCULAR | Status: AC
Start: 2023-04-03 — End: 2023-04-03
  Administered 2023-04-03: 30 mg via INTRAMUSCULAR
  Filled 2023-04-03: qty 1

## 2023-04-03 MED ORDER — CYANOCOBALAMIN 1000 MCG/ML IJ SOLN
1000.0000 ug | Freq: Once | INTRAMUSCULAR | Status: AC
  Administered 2023-04-03: 1000 ug via INTRAMUSCULAR
  Filled 2023-04-03: qty 1

## 2023-04-12 ENCOUNTER — Other Ambulatory Visit: Payer: Self-pay | Admitting: Internal Medicine

## 2023-04-12 DIAGNOSIS — C7A019 Malignant carcinoid tumor of the small intestine, unspecified portion: Secondary | ICD-10-CM

## 2023-04-12 DIAGNOSIS — Z79811 Long term (current) use of aromatase inhibitors: Secondary | ICD-10-CM

## 2023-04-13 ENCOUNTER — Encounter: Payer: Self-pay | Admitting: Internal Medicine

## 2023-05-01 ENCOUNTER — Other Ambulatory Visit: Payer: Self-pay

## 2023-05-01 DIAGNOSIS — K6389 Other specified diseases of intestine: Secondary | ICD-10-CM

## 2023-05-01 DIAGNOSIS — C7A8 Other malignant neuroendocrine tumors: Secondary | ICD-10-CM

## 2023-05-01 DIAGNOSIS — E538 Deficiency of other specified B group vitamins: Secondary | ICD-10-CM

## 2023-05-04 ENCOUNTER — Inpatient Hospital Stay: Payer: Medicare Other

## 2023-05-04 ENCOUNTER — Inpatient Hospital Stay (HOSPITAL_BASED_OUTPATIENT_CLINIC_OR_DEPARTMENT_OTHER): Payer: Medicare Other | Admitting: Internal Medicine

## 2023-05-04 ENCOUNTER — Encounter: Payer: Self-pay | Admitting: Internal Medicine

## 2023-05-04 ENCOUNTER — Inpatient Hospital Stay: Payer: Medicare Other | Attending: Internal Medicine

## 2023-05-04 VITALS — BP 131/58 | HR 55 | Temp 96.0°F | Resp 18

## 2023-05-04 DIAGNOSIS — C7B02 Secondary carcinoid tumors of liver: Secondary | ICD-10-CM

## 2023-05-04 DIAGNOSIS — Z7901 Long term (current) use of anticoagulants: Secondary | ICD-10-CM | POA: Insufficient documentation

## 2023-05-04 DIAGNOSIS — K921 Melena: Secondary | ICD-10-CM | POA: Insufficient documentation

## 2023-05-04 DIAGNOSIS — D509 Iron deficiency anemia, unspecified: Secondary | ICD-10-CM | POA: Insufficient documentation

## 2023-05-04 DIAGNOSIS — E876 Hypokalemia: Secondary | ICD-10-CM | POA: Diagnosis not present

## 2023-05-04 DIAGNOSIS — I4891 Unspecified atrial fibrillation: Secondary | ICD-10-CM | POA: Insufficient documentation

## 2023-05-04 DIAGNOSIS — K6389 Other specified diseases of intestine: Secondary | ICD-10-CM

## 2023-05-04 DIAGNOSIS — E3400 Carcinoid syndrome, unspecified: Secondary | ICD-10-CM | POA: Diagnosis not present

## 2023-05-04 DIAGNOSIS — C7A019 Malignant carcinoid tumor of the small intestine, unspecified portion: Secondary | ICD-10-CM | POA: Insufficient documentation

## 2023-05-04 DIAGNOSIS — C7A8 Other malignant neuroendocrine tumors: Secondary | ICD-10-CM

## 2023-05-04 DIAGNOSIS — E538 Deficiency of other specified B group vitamins: Secondary | ICD-10-CM

## 2023-05-04 LAB — CMP (CANCER CENTER ONLY)
ALT: 13 U/L (ref 0–44)
AST: 22 U/L (ref 15–41)
Albumin: 3.6 g/dL (ref 3.5–5.0)
Alkaline Phosphatase: 88 U/L (ref 38–126)
Anion gap: 5 (ref 5–15)
BUN: 18 mg/dL (ref 8–23)
CO2: 21 mmol/L — ABNORMAL LOW (ref 22–32)
Calcium: 8.8 mg/dL — ABNORMAL LOW (ref 8.9–10.3)
Chloride: 105 mmol/L (ref 98–111)
Creatinine: 0.76 mg/dL (ref 0.44–1.00)
GFR, Estimated: >60 mL/min (ref >60)
Glucose, Bld: 115 mg/dL — ABNORMAL HIGH (ref 70–99)
Potassium: 3.6 mmol/L (ref 3.5–5.1)
Sodium: 131 mmol/L — ABNORMAL LOW (ref 135–145)
Total Bilirubin: 0.6 mg/dL (ref 0.0–1.2)
Total Protein: 6.5 g/dL (ref 6.5–8.1)

## 2023-05-04 LAB — CBC (CANCER CENTER ONLY)
HCT: 28.2 % — ABNORMAL LOW (ref 36.0–46.0)
Hemoglobin: 9 g/dL — ABNORMAL LOW (ref 12.0–15.0)
MCH: 29.1 pg (ref 26.0–34.0)
MCHC: 31.9 g/dL (ref 30.0–36.0)
MCV: 91.3 fL (ref 80.0–100.0)
Platelet Count: 229 10*3/uL (ref 150–400)
RBC: 3.09 MIL/uL — ABNORMAL LOW (ref 3.87–5.11)
RDW: 14 % (ref 11.5–15.5)
WBC Count: 6.1 10*3/uL (ref 4.0–10.5)
nRBC: 0 % (ref 0.0–0.2)

## 2023-05-04 LAB — IRON AND TIBC
Iron: 24 ug/dL — ABNORMAL LOW (ref 28–170)
Saturation Ratios: 5 % — ABNORMAL LOW (ref 10.4–31.8)
TIBC: 476 ug/dL — ABNORMAL HIGH (ref 250–450)
UIBC: 452 ug/dL

## 2023-05-04 LAB — FERRITIN: Ferritin: 11 ng/mL (ref 11–307)

## 2023-05-04 LAB — VITAMIN B12: Vitamin B-12: 221 pg/mL (ref 180–914)

## 2023-05-04 MED ORDER — IRON SUCROSE 20 MG/ML IV SOLN
200.0000 mg | Freq: Once | INTRAVENOUS | Status: AC
  Administered 2023-05-04: 200 mg via INTRAVENOUS

## 2023-05-04 MED ORDER — CYANOCOBALAMIN 1000 MCG/ML IJ SOLN
1000.0000 ug | Freq: Once | INTRAMUSCULAR | Status: AC
  Administered 2023-05-04: 1000 ug via INTRAMUSCULAR

## 2023-05-04 MED ORDER — OCTREOTIDE ACETATE 30 MG IM KIT
30.0000 mg | PACK | Freq: Once | INTRAMUSCULAR | Status: AC
  Administered 2023-05-04: 30 mg via INTRAMUSCULAR
  Filled 2023-05-04: qty 1

## 2023-05-04 NOTE — Assessment & Plan Note (Addendum)
#   Non- functional Small bowel/mesenteric carcinoid; s/p Lutathera + sandostatin x4 infusions.  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 Echo March 2023- WNL; NO right sided abnormalities.  #Continue Sandostatin q M.  No obvious clinical evidence of progression of disease noticed.  labs today reviewed; see below re: anemia.   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  Declined GI appointment in past.    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   # Hypokalemia-potassium 3.3; secondary to diuretics- -stable.   ## A.fib 2023; March- on eliquis [Dr.Parashoes Cardiologist];  Monitor closely given history of rectal bleeding--stable.   # b12 def- STOP oral B12 supplementation; proceed with B12 injections once monthly.    # DISPOSITION- #  Ok with Sandostatin Venofer; and B12 injection #  in 1 month b12 -Sandostatin; venofer # Follow up in  2 months-  MD; labs-- B12 levels; cbc/cmp/ chromogranin; b12 injection -Sandostatin; possible venofer-  Dr.B

## 2023-05-04 NOTE — Progress Notes (Signed)
 Pt has chronic diarrhea.  Pt has a good appetite. Denies nausea. Denies pain. Pt has fatigue.

## 2023-05-04 NOTE — Progress Notes (Signed)
 [facility] OFFICE PROGRESS NOTE  Patient Care Team: Jerl Mina, [name] Delma Freeze, [name] Marcina Millard, [name] Mick Sell, [name] Earna Coder, [name]   Cancer Staging  No matching staging information was found for the patient.    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   # FEB 2018- Ga PET- uptake in liver/ mesenteric mass; NOV 2019- increase sandostatin to 30mg  q monthly.   #S/p Lutathera x4 every 2 months; last treatment mid September 2022 [Dr.edmunds]; OCT 2022- PET STABLE.   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  In a wheelchair.  Alone.   Emma [name] 83 y.o.  female pleasant patient above history of metastatic carcinoid tumor currently on Sandostatin/; s/p Lutathera infusions every 2 months ; iron deficient anemia question AV malformation; A-fib on Eliquis is here for follow-up.  Patient continues to have chronic diarrhea. Pt has a good appetite. Denies nausea. Denies pain. Pt has fatigue.   Intermittent blood in stools.  Ambulates at home with a walker. In wheelchair at clinic    Review of Systems  Constitutional:  Positive for malaise/fatigue. Negative for chills, diaphoresis, fever and weight loss.  HENT:  Negative for nosebleeds and sore throat.   Eyes:  Negative for double vision.  Respiratory:  Negative for hemoptysis, sputum production, shortness of breath and wheezing.   Cardiovascular:  Negative for chest pain, palpitations and orthopnea.  Gastrointestinal:  Positive for abdominal pain and diarrhea. Negative for constipation, heartburn, melena, nausea and vomiting.  Genitourinary:  Negative for dysuria, frequency and urgency.  Musculoskeletal:  Positive for back pain. Negative for joint pain.  Skin: Negative.  Negative for itching and rash.  Neurological:  Negative for dizziness, tingling, focal weakness, weakness and headaches.  Endo/Heme/Allergies:  Does not bruise/bleed easily.  Psychiatric/Behavioral:  Negative for depression. The patient is not nervous/anxious and does not have insomnia.       PAST MEDICAL HISTORY :  Past Medical History:  Diagnosis Date   Aromatase inhibitor use    Atrial fibrillation (HCC)    Breast cancer (HCC) 2014   left breast cancer/radiation  Breast cancer, left breast (HCC) 06/19/2014   CHF (congestive heart failure) (HCC)    recent sepsis   Chronic kidney disease    Dizziness    Dyspnea    recently while admitted   GERD  (gastroesophageal reflux disease)    Hearing loss    History of hiatal hernia    History of kidney stones    HOH (hard of hearing)    severe   Hypertension    Hypothyroidism    Leg DVT (deep venous thromboembolism), acute (HCC) 07/2015   left leg after fracture   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   Pain    chest pain while admitted   Personal history of radiation therapy 2015   left breast ca   Thyroid disease     PAST SURGICAL HISTORY :   Past Surgical History:  Procedure Laterality Date   ABDOMINAL HYSTERECTOMY     partial   BREAST LUMPECTOMY Left 01/13/2013   invasive mammary carcinoma, clear margins, LN negative   BREAST LUMPECTOMY WITH SENTINEL LYMPH NODE BIOPSY Left 2014   CHOLECYSTECTOMY     COLONOSCOPY WITH PROPOFOL N/A 09/17/2017   Procedure: COLONOSCOPY WITH PROPOFOL;  Surgeon: Wyline Mood, MD;  Location: [facility] ENDOSCOPY;  Service: Gastroenterology;  Laterality: N/A;   CYSTOSCOPY W/ URETERAL STENT PLACEMENT Right 01/07/2016   Procedure: CYSTOSCOPY WITH STENT REPLACEMENT;  Surgeon: Vanna Scotland, MD;  Location: [facility] ORS;  Service: Urology;  Laterality: Right;   CYSTOSCOPY WITH RETROGRADE PYELOGRAM, URETEROSCOPY AND STENT PLACEMENT Right 12/19/2015   Procedure: CYSTOSCOPY WITH RETROGRADE PYELOGRAM, URETEROSCOPY AND STENT PLACEMENT;  Surgeon: Crist Fat, MD;  Location: [facility] ORS;  Service: Urology;  Laterality: Right;   URETEROSCOPY WITH HOLMIUM LASER LITHOTRIPSY Right 01/07/2016   Procedure: URETEROSCOPY WITH HOLMIUM LASER LITHOTRIPSY;  Surgeon: Vanna Scotland, MD;  Location: [facility] ORS;  Service: Urology;  Laterality: Right;    FAMILY HISTORY :   Family History  Problem Relation Age of Onset   Breast cancer Mother 44       deceased 83   Breast cancer Maternal Aunt 62       deceased 51s   Prostate cancer Father        deceased 55   Colon cancer Paternal Aunt 25       deceased 39s    SOCIAL HISTORY:   Social History   Tobacco Use   Smoking status: Never    Smokeless tobacco: Never  Vaping Use   Vaping status: Never Used  Substance Use Topics   Alcohol use: No    Alcohol/week: 0.0 standard drinks of alcohol   Drug use: No    ALLERGIES:  is allergic to sulfa antibiotics.  MEDICATIONS:  Current Outpatient Medications  Medication Sig Dispense Refill   apixaban (ELIQUIS) 5 MG TABS tablet Take 1 tablet (5 mg total) by mouth 2 (two) times daily. 60 tablet 0   aspirin EC 81 MG tablet Take 81 mg by mouth daily as needed. Pt reports taking if she feels her heart "flutter"     diltiazem (TIADYLT ER) 120 MG 24 hr capsule Take 120 mg by mouth daily.     furosemide (LASIX) 40 MG tablet Take 40 mg by mouth daily.     labetalol (NORMODYNE) 100 MG tablet Take 100 mg by mouth 2 (two) times daily.     levothyroxine (SYNTHROID) 88 MCG tablet Take 88 mcg by mouth daily before breakfast.      losartan-hydrochlorothiazide (HYZAAR) 100-25 MG tablet Take 1 tablet by mouth daily.  nystatin (MYCOSTATIN/NYSTOP) powder APPLY TO AFFECTED AREA TWICE A DAY 15 g 3   pantoprazole (PROTONIX) 40 MG tablet TAKE 1 TABLET BY MOUTH EVERY DAY 90 tablet 0   potassium chloride SA (KLOR-CON M) 20 MEQ tablet TAKE 1 TABLET BY MOUTH EVERY DAY 90 tablet 0   traMADol (ULTRAM) 50 MG tablet 0.5 tablet to 1 tablet every 8 hours as needed for pain 30 tablet 0   Vitamin D, Ergocalciferol, (DRISDOL) 1.25 MG (50000 UNIT) CAPS capsule TAKE 1 CAPSULE BY MOUTH ONE TIME PER WEEK 12 capsule 3   No current facility-administered medications for this visit.   Facility-Administered Medications Ordered in Other Visits  Medication Dose Route Frequency Provider Last Rate Last Admin   cyanocobalamin (VITAMIN B12) injection 1,000 mcg  1,000 mcg Intramuscular Once Louretta Shorten R, MD       octreotide (SANDOSTATIN LAR) 30 MG IM injection            octreotide (SANDOSTATIN LAR) IM injection 30 mg  30 mg Intramuscular Once Earna Coder, MD        PHYSICAL EXAMINATION: ECOG PERFORMANCE  STATUS: 1 - Symptomatic but completely ambulatory  BP 125/60 (BP Location: Right Arm, Patient Position: Sitting)   Pulse (!) 57   Temp 97.7 F (36.5 C) (Oral)   Resp 17   Wt 209 lb (94.8 kg)   SpO2 95%   BMI 34.78 kg/m   Filed Weights   05/04/23 1334  Weight: 209 lb (94.8 kg)         Physical Exam HENT:     Head: Normocephalic and atraumatic.     Mouth/Throat:     Pharynx: No oropharyngeal exudate.  Eyes:     Pupils: Pupils are equal, round, and reactive to light.  Cardiovascular:     Rate and Rhythm: Normal rate. Rhythm irregular.  Pulmonary:     Effort: Pulmonary effort is normal. No respiratory distress.     Breath sounds: Normal breath sounds. No wheezing.  Abdominal:     General: Bowel sounds are normal. There is no distension.     Palpations: Abdomen is soft. There is no mass.     Tenderness: There is no abdominal tenderness. There is no guarding or rebound.  Musculoskeletal:        General: Swelling present. No tenderness. Normal range of motion.     Cervical back: Normal range of motion and neck supple.  Skin:    General: Skin is warm.  Neurological:     Mental Status: She is alert and oriented to person, place, and time.  Psychiatric:        Mood and Affect: Affect normal.     LABORATORY DATA:  I have reviewed the data as listed    Component Value Date/Time   NA 131 (L) 05/04/2023 1321   NA 137 07/12/2013 0841   K 3.6 05/04/2023 1321   K 4.7 07/12/2013 0841   CL 105 05/04/2023 1321   CL 107 07/12/2013 0841   CO2 21 (L) 05/04/2023 1321   CO2 20 (L) 07/12/2013 0841   GLUCOSE 115 (H) 05/04/2023 1321   GLUCOSE 122 (H) 07/12/2013 0841   BUN 18 05/04/2023 1321   BUN 18 07/12/2013 0841   CREATININE 0.76 05/04/2023 1321   CREATININE 0.64 05/22/2014 1111   CALCIUM 8.8 (L) 05/04/2023 1321   CALCIUM 9.2 07/12/2013 0841   PROT 6.5 05/04/2023 1321   PROT 7.0 05/22/2014 1111   ALBUMIN 3.6 05/04/2023 1321   ALBUMIN 4.2  05/22/2014 1111   AST 22  05/04/2023 1321   ALT 13 05/04/2023 1321   ALT 17 05/22/2014 1111   ALKPHOS 88 05/04/2023 1321   ALKPHOS 95 05/22/2014 1111   BILITOT 0.6 05/04/2023 1321   GFRNONAA >60 05/04/2023 1321   GFRNONAA >60 05/22/2014 1111   GFRAA >60 10/07/2019 1319   GFRAA >60 05/22/2014 1111    No results found for: "SPEP", "UPEP"  Lab Results  Component Value Date   WBC 6.1 05/04/2023   NEUTROABS 4.8 03/06/2023   HGB 9.0 (L) 05/04/2023   HCT 28.2 (L) 05/04/2023   MCV 91.3 05/04/2023   PLT 229 05/04/2023      Chemistry      Component Value Date/Time   NA 131 (L) 05/04/2023 1321   NA 137 07/12/2013 0841   K 3.6 05/04/2023 1321   K 4.7 07/12/2013 0841   CL 105 05/04/2023 1321   CL 107 07/12/2013 0841   CO2 21 (L) 05/04/2023 1321   CO2 20 (L) 07/12/2013 0841   BUN 18 05/04/2023 1321   BUN 18 07/12/2013 0841   CREATININE 0.76 05/04/2023 1321   CREATININE 0.64 05/22/2014 1111      Component Value Date/Time   CALCIUM 8.8 (L) 05/04/2023 1321   CALCIUM 9.2 07/12/2013 0841   ALKPHOS 88 05/04/2023 1321   ALKPHOS 95 05/22/2014 1111   AST 22 05/04/2023 1321   ALT 13 05/04/2023 1321   ALT 17 05/22/2014 1111   BILITOT 0.6 05/04/2023 1321       RADIOGRAPHIC STUDIES: I have personally reviewed the radiological images as listed and agreed with the findings in the report. No results found.   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 Focus of intense radiotracer activity within the adjacent small bowel consistent with neuroendocrine tumor of the small bowel; Peritoneal metastasis in the ventral  peritoneal space not changed from prior. Echo March 2023- WNL; NO right sided abnormalities.  #Continue Sandostatin q M.  No obvious clinical evidence of progression of disease noticed.  labs today reviewed; see below re: anemia.   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  Declined GI appointment in past.    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   # Hypokalemia-potassium 3.3; secondary to diuretics- -stable.   ## A.fib 2023; March- on eliquis [Dr.Parashoes Cardiologist];  Monitor closely given history of rectal bleeding--stable.   # b12 def- STOP oral B12 supplementation; proceed with B12 injections once monthly.    # DISPOSITION- #  Ok with Sandostatin Venofer; and B12 injection #  in 1 month b12 -Sandostatin; venofer # Follow up in  2 months-  MD; labs-- B12 levels; cbc/cmp/ chromogranin; b12 injection -Sandostatin; possible venofer-  Dr.B    Orders Placed This Encounter  Procedures   Vitamin B12    Standing Status:   Future    Expected Date:   07/04/2023    Expiration Date:   05/03/2024   CBC with Differential (Cancer Center Only)    Standing Status:   Future  Expected Date:   07/04/2023    Expiration Date:   05/03/2024   CMP (Cancer Center only)    Standing Status:   Future    Expected Date:   07/04/2023    Expiration Date:   05/03/2024   Chromogranin A    Standing Status:   Future    Expected Date:   07/04/2023    Expiration Date:   05/03/2024    All questions were answered. The patient knows to call the clinic with any problems, questions or concerns.      Earna Coder, MD 05/04/2023 2:05 PM

## 2023-05-06 LAB — CHROMOGRANIN A: Chromogranin A (ng/mL): 113.7 ng/mL — ABNORMAL HIGH (ref 0.0–101.8)

## 2023-06-03 ENCOUNTER — Inpatient Hospital Stay

## 2023-06-05 ENCOUNTER — Inpatient Hospital Stay: Attending: Internal Medicine

## 2023-06-05 ENCOUNTER — Inpatient Hospital Stay (HOSPITAL_BASED_OUTPATIENT_CLINIC_OR_DEPARTMENT_OTHER): Admitting: Nurse Practitioner

## 2023-06-05 VITALS — BP 110/74 | HR 91 | Temp 98.1°F | Resp 15

## 2023-06-05 VITALS — BP 110/74 | HR 91 | Temp 98.1°F | Resp 20

## 2023-06-05 DIAGNOSIS — D509 Iron deficiency anemia, unspecified: Secondary | ICD-10-CM | POA: Insufficient documentation

## 2023-06-05 DIAGNOSIS — Z86718 Personal history of other venous thrombosis and embolism: Secondary | ICD-10-CM | POA: Insufficient documentation

## 2023-06-05 DIAGNOSIS — Z20828 Contact with and (suspected) exposure to other viral communicable diseases: Secondary | ICD-10-CM | POA: Diagnosis not present

## 2023-06-05 DIAGNOSIS — C7B02 Secondary carcinoid tumors of liver: Secondary | ICD-10-CM | POA: Insufficient documentation

## 2023-06-05 DIAGNOSIS — C7A019 Malignant carcinoid tumor of the small intestine, unspecified portion: Secondary | ICD-10-CM | POA: Diagnosis present

## 2023-06-05 DIAGNOSIS — K6389 Other specified diseases of intestine: Secondary | ICD-10-CM

## 2023-06-05 DIAGNOSIS — C7A8 Other malignant neuroendocrine tumors: Secondary | ICD-10-CM

## 2023-06-05 DIAGNOSIS — E3400 Carcinoid syndrome, unspecified: Secondary | ICD-10-CM | POA: Insufficient documentation

## 2023-06-05 MED ORDER — CYANOCOBALAMIN 1000 MCG/ML IJ SOLN
1000.0000 ug | Freq: Once | INTRAMUSCULAR | Status: AC
  Administered 2023-06-05: 1000 ug via INTRAMUSCULAR
  Filled 2023-06-05: qty 1

## 2023-06-05 MED ORDER — OCTREOTIDE ACETATE 30 MG IM KIT
30.0000 mg | PACK | Freq: Once | INTRAMUSCULAR | Status: AC
  Administered 2023-06-05: 30 mg via INTRAMUSCULAR
  Filled 2023-06-05: qty 1

## 2023-06-05 MED ORDER — IRON SUCROSE 20 MG/ML IV SOLN
200.0000 mg | Freq: Once | INTRAVENOUS | Status: AC
  Administered 2023-06-05: 200 mg via INTRAVENOUS
  Filled 2023-06-05: qty 10

## 2023-06-05 NOTE — Patient Instructions (Signed)
 Iron  Sucrose Injection What is this medication? IRON  SUCROSE (EYE ern SOO krose) treats low levels of iron  (iron  deficiency anemia) in people with kidney disease. Iron  is a mineral that plays an important role in making red blood cells, which carry oxygen from your lungs to the rest of your body. This medicine may be used for other purposes; ask your health care provider or pharmacist if you have questions. COMMON BRAND NAME(S): Venofer  What should I tell my care team before I take this medication? They need to know if you have any of these conditions: Anemia not caused by low iron  levels Heart disease High levels of iron  in the blood Kidney disease Liver disease An unusual or allergic reaction to iron , other medications, foods, dyes, or preservatives Pregnant or trying to get pregnant Breastfeeding How should I use this medication? This medication is infused into a vein. It is given by your care team in a hospital or clinic setting. Talk to your care team about the use of this medication in children. While it may be prescribed for children as young as 2 years for selected conditions, precautions do apply. Overdosage: If you think you have taken too much of this medicine contact a poison control center or emergency room at once. NOTE: This medicine is only for you. Do not share this medicine with others. What if I miss a dose? Keep appointments for follow-up doses. It is important not to miss your dose. Call your care team if you are unable to keep an appointment. What may interact with this medication? Do not take this medication with any of the following: Deferoxamine Dimercaprol Other iron  products This medication may also interact with the following: Chloramphenicol Deferasirox This list may not describe all possible interactions. Give your health care provider a list of all the medicines, herbs, non-prescription drugs, or dietary supplements you use. Also tell them if you smoke,  drink alcohol, or use illegal drugs. Some items may interact with your medicine. What should I watch for while using this medication? Visit your care team for regular checks on your progress. Tell your care team if your symptoms do not start to get better or if they get worse. You may need blood work done while you are taking this medication. You may need to eat more foods that contain iron . Talk to your care team. Foods that contain iron  include whole grains or cereals, dried fruits, beans, peas, leafy green vegetables, and organ meats (liver, kidney). What side effects may I notice from receiving this medication? Side effects that you should report to your care team as soon as possible: Allergic reactions--skin rash, itching, hives, swelling of the face, lips, tongue, or throat Low blood pressure--dizziness, feeling faint or lightheaded, blurry vision Shortness of breath Side effects that usually do not require medical attention (report to your care team if they continue or are bothersome): Flushing Headache Joint pain Muscle pain Nausea Pain, redness, or irritation at injection site This list may not describe all possible side effects. Call your doctor for medical advice about side effects. You may report side effects to FDA at 1-800-FDA-1088. Where should I keep my medication? This medication is given in a hospital or clinic. It will not be stored at home. NOTE: This sheet is a summary. It may not cover all possible information. If you have questions about this medicine, talk to your doctor, pharmacist, or health care provider.  2024 Elsevier/Gold Standard (2022-09-03 00:00:00)  Vitamin B12 Injection What is this medication?  Vitamin B12 (VAHY tuh min B12) prevents and treats low vitamin B12 levels in your body. It is used in people who do not get enough vitamin B12 from their diet or when their digestive tract does not absorb enough. Vitamin B12 plays an important role in maintaining  the health of your nervous system and red blood cells. This medicine may be used for other purposes; ask your health care provider or pharmacist if you have questions. COMMON BRAND NAME(S): B-12 Compliance Kit, B-12 Injection Kit, Cyomin, Dodex , LA-12, Nutri-Twelve, Physicians EZ Use B-12, Primabalt, Vitamin Deficiency Injectable System - B12 What should I tell my care team before I take this medication? They need to know if you have any of these conditions: Kidney disease Leber's disease Megaloblastic anemia An unusual or allergic reaction to cyanocobalamin , cobalt, other medications, foods, dyes, or preservatives Pregnant or trying to get pregnant Breast-feeding How should I use this medication? This medication is injected into a muscle or deeply under the skin. It is usually given in a clinic or care team's office. However, your care team may teach you how to inject yourself. Follow all instructions. Talk to your care team about the use of this medication in children. Special care may be needed. Overdosage: If you think you have taken too much of this medicine contact a poison control center or emergency room at once. NOTE: This medicine is only for you. Do not share this medicine with others. What if I miss a dose? If you are given your dose at a clinic or care team's office, call to reschedule your appointment. If you give your own injections, and you miss a dose, take it as soon as you can. If it is almost time for your next dose, take only that dose. Do not take double or extra doses. What may interact with this medication? Alcohol Colchicine This list may not describe all possible interactions. Give your health care provider a list of all the medicines, herbs, non-prescription drugs, or dietary supplements you use. Also tell them if you smoke, drink alcohol, or use illegal drugs. Some items may interact with your medicine. What should I watch for while using this medication? Visit your  care team regularly. You may need blood work done while you are taking this medication. You may need to follow a special diet. Talk to your care team. Limit your alcohol intake and avoid smoking to get the best benefit. What side effects may I notice from receiving this medication? Side effects that you should report to your care team as soon as possible: Allergic reactions--skin rash, itching, hives, swelling of the face, lips, tongue, or throat Swelling of the ankles, hands, or feet Trouble breathing Side effects that usually do not require medical attention (report to your care team if they continue or are bothersome): Diarrhea This list may not describe all possible side effects. Call your doctor for medical advice about side effects. You may report side effects to FDA at 1-800-FDA-1088. Where should I keep my medication? Keep out of the reach of children. Store at room temperature between 15 and 30 degrees C (59 and 85 degrees F). Protect from light. Throw away any unused medication after the expiration date. NOTE: This sheet is a summary. It may not cover all possible information. If you have questions about this medicine, talk to your doctor, pharmacist, or health care provider.  2024 Elsevier/Gold Standard (2020-09-25 00:00:00)  Octreotide  Injection Solution What is this medication? OCTREOTIDE  (ok TREE oh tide) treats  high levels of growth hormone (acromegaly). It works by reducing the amount of growth hormone your body makes. This reduces symptoms and the risk of health problems caused by too much growth hormone, such as diabetes and heart disease. It may also be used to treat diarrhea caused by neuroendocrine tumors. It works by slowing down the release of serotonin from the tumor cells. This reduces the number of bowel movements you have. This medicine may be used for other purposes; ask your health care provider or pharmacist if you have questions. COMMON BRAND NAME(S): Bynfezia,  Sandostatin  What should I tell my care team before I take this medication? They need to know if you have any of these conditions: Diabetes Gallbladder disease Heart disease Kidney disease Liver disease Pancreatic disease Thyroid  disease An unusual or allergic reaction to octreotide , other medications, foods, dyes, or preservatives Pregnant or trying to get pregnant Breastfeeding How should I use this medication? This medication is injected under the skin or into a vein. It is usually given by your care team in a hospital or clinic setting. If you get this medication at home, you will be taught how to prepare and give it. Use exactly as directed. Take it as directed on the prescription label at the same time every day. Keep taking it unless your care team tells you to stop. Allow the injection solution to come to room temperature before use. Do not warm it artificially. It is important that you put your used needles and syringes in a special sharps container. Do not put them in a trash can. If you do not have a sharps container, call your pharmacist or care team to get one. Talk to your care team about the use of this medication in children. Special care may be needed. Overdosage: If you think you have taken too much of this medicine contact a poison control center or emergency room at once. NOTE: This medicine is only for you. Do not share this medicine with others. What if I miss a dose? If you miss a dose, take it as soon as you can. If it is almost time for your next dose, take only that dose. Do not take double or extra doses. What may interact with this medication? Bromocriptine Certain medications for blood pressure, heart disease, irregular heartbeat Cyclosporine Diuretics Medications for diabetes, including insulin Quinidine This list may not describe all possible interactions. Give your health care provider a list of all the medicines, herbs, non-prescription drugs, or dietary  supplements you use. Also tell them if you smoke, drink alcohol, or use illegal drugs. Some items may interact with your medicine. What should I watch for while using this medication? Visit your care team for regular checks on your progress. Tell your care team if your symptoms do not start to get better or if they get worse. To help reduce irritation at the injection site, use a different site for each injection and make sure the solution is at room temperature before use. This medication may cause decreases in blood sugar. Signs of low blood sugar include chills, cool, pale skin or cold sweats, drowsiness, extreme hunger, fast heartbeat, headache, nausea, nervousness or anxiety, shakiness, trembling, unsteadiness, tiredness, or weakness. Contact your care team right away if you experience any of these symptoms. This medication may increase blood sugar. The risk may be higher in patients who already have diabetes. Ask your care team what you can do to lower your risk of diabetes while taking this medication. You  should make sure you get enough vitamin B12 while you are taking this medication. Discuss the foods you eat and the vitamins you take with your care team. What side effects may I notice from receiving this medication? Side effects that you should report to your care team as soon as possible: Allergic reactions--skin rash, itching, hives, swelling of the face, lips, tongue, or throat Gallbladder problems--severe stomach pain, nausea, vomiting, fever Heart rhythm changes--fast or irregular heartbeat, dizziness, feeling faint or lightheaded, chest pain, trouble breathing High blood sugar (hyperglycemia)--increased thirst or amount of urine, unusual weakness or fatigue, blurry vision Low blood sugar (hypoglycemia)--tremors or shaking, anxiety, sweating, cold or clammy skin, confusion, dizziness, rapid heartbeat Low thyroid  levels (hypothyroidism)--unusual weakness or fatigue, increased sensitivity  to cold, constipation, hair loss, dry skin, weight gain, feelings of depression Low vitamin B12 level--pain, tingling, or numbness in the hands or feet, muscle weakness, dizziness, confusion, trouble concentrating Oily or light-colored stools, diarrhea, bloating, weight loss Pancreatitis--severe stomach pain that spreads to your back or gets worse after eating or when touched, fever, nausea, vomiting Slow heartbeat--dizziness, feeling faint or lightheaded, confusion, trouble breathing, unusual weakness or fatigue Side effects that usually do not require medical attention (report these to your care team if they continue or are bothersome): Diarrhea Dizziness Headache Nausea Pain, redness, or irritation at injection site Stomach pain This list may not describe all possible side effects. Call your doctor for medical advice about side effects. You may report side effects to FDA at 1-800-FDA-1088. Where should I keep my medication? Keep out of the reach of children and pets. Store in the refrigerator. Protect from light. Allow to come to room temperature naturally. Do not use artificial heat. If protected from light, the injection may be stored between 20 and 30 degrees C (70 and 86 degrees F) for 14 days. After the initial use, throw away any unused portion of a multiple dose vial after 14 days. Get rid of any unused portions of the ampules after use. To get rid of medications that are no longer needed or have expired: Take the medication to a medication take-back program. Ask your pharmacy or law enforcement to find a location. If you cannot return the medication, ask your pharmacist or care team how to get rid of the medication safely. NOTE: This sheet is a summary. It may not cover all possible information. If you have questions about this medicine, talk to your doctor, pharmacist, or health care provider.  2024 Elsevier/Gold Standard (2022-12-26 00:00:00)

## 2023-06-05 NOTE — Progress Notes (Signed)
 Symptom Management Clinic  [facility] at [facility] A Department of the [place]. [facility] [address], [address] [place], [place] [address] [phone] (phone) [phone] (fax)  Patient Care Team: Lyle San, [name] Charlette Console, [name] Percival Brace, [name] [name], [name] Gwyn Leos, [name]   Name of the patient: Emma [name]  [id number]  Apr 26, 1940   Date of visit: 06/05/23  Diagnosis- Carcinoid  Chief complaint/ Reason for visit- Viral exposure  Heme/Onc history:  Oncology History Overview Note  # JUNE 2015-MESENTERIC MASS [~5-6cm] Presumed CARCINOID with mets to liver [AUG 2016-Octreoscan; Dr.Hurwitz; [facility]]; FEB 2016- START OCTREOTIDE  LAR qM; Octreo scan- FEB 2017- STABLE; cont Octreotide '; OCT 5th OCTREO SCAN- STABLE; mesenteric mass present.   # FEB 2018- Ga PET- uptake in liver/ mesenteric mass; NOV 2019- increase sandostatin  to 30mg  q monthly.   #S/p Lutathera  x4 every 2 months; last treatment mid September 2022 [Dr.edmunds]; OCT 2022- PET STABLE.   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 female with above history of metastatic malignant carcinoid who presented to clinic today for her scheduled sandostatin , venofer , and b12. Her son expressed concern that she may not feel well and was exposed to several family members over the past week who have had flu like symptoms. No viral testing was performed. Symptoms included congestion, cough, myalgias, and fever. Porfirio Bristol says that patient has been complaining of her a fib symptoms over past few days and that he had heard her cough a few times. No fevers. Patient denies any cough, congestion and says she feels well. She has chronic a fib but denies any new or worsening symptoms. Denies flushing. She has chronic diarrhea which is unchanged.   Review of systems- Review of Systems  Constitutional:  Positive for malaise/fatigue. Negative for chills, diaphoresis, fever and weight loss.  HENT:  Positive for hearing loss. Negative for congestion, ear pain, sinus pain and sore throat.   Respiratory:  Negative for cough, hemoptysis, sputum production and shortness of breath.   Cardiovascular:  Negative for chest pain, palpitations and orthopnea.  Gastrointestinal:  Positive for diarrhea. Negative for blood in stool and nausea.  Genitourinary:  Negative for dysuria.  Musculoskeletal:  Negative for myalgias.  Skin:        No flushing    Allergies  Allergen Reactions   Sulfa Antibiotics Other (See Comments)    Other reaction(s): Kidney Disorder   Past Medical History:  Diagnosis Date   Aromatase inhibitor use    Atrial fibrillation (HCC)    Breast cancer (HCC) 2014    left breast cancer/radiation   Breast cancer, left breast (HCC) 06/19/2014   CHF (congestive heart failure) (HCC)    recent sepsis   Chronic kidney disease    Dizziness    Dyspnea    recently while admitted   GERD (gastroesophageal reflux disease)    Hearing loss    History of hiatal hernia    History of kidney stones    HOH (hard of hearing)    severe   Hypertension    Hypothyroidism    Leg DVT (deep venous thromboembolism), acute (HCC) 07/2015   left leg after fracture   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   Pain    chest pain while admitted   Personal history of radiation therapy 2015   left breast ca   Thyroid  disease    Past Surgical History:  Procedure Laterality Date   ABDOMINAL HYSTERECTOMY     partial   BREAST LUMPECTOMY Left 01/13/2013   invasive mammary carcinoma, clear margins, LN negative   BREAST LUMPECTOMY WITH SENTINEL LYMPH NODE BIOPSY Left 2014   CHOLECYSTECTOMY     COLONOSCOPY WITH PROPOFOL  N/A 09/17/2017   Procedure: COLONOSCOPY WITH PROPOFOL ;  Surgeon: Luke Salaam, MD;  Location: [facility] ENDOSCOPY;  Service: Gastroenterology;  Laterality: N/A;   CYSTOSCOPY W/ URETERAL STENT PLACEMENT Right 01/07/2016   Procedure: CYSTOSCOPY WITH STENT REPLACEMENT;  Surgeon: Dustin Gimenez, MD;  Location: [facility] ORS;  Service: Urology;  Laterality: Right;   CYSTOSCOPY WITH RETROGRADE PYELOGRAM, URETEROSCOPY AND STENT PLACEMENT Right 12/19/2015   Procedure: CYSTOSCOPY WITH RETROGRADE PYELOGRAM, URETEROSCOPY AND STENT PLACEMENT;  Surgeon: Andrez Banker, MD;  Location: [facility] ORS;  Service: Urology;  Laterality: Right;   URETEROSCOPY WITH HOLMIUM LASER LITHOTRIPSY Right 01/07/2016   Procedure: URETEROSCOPY WITH HOLMIUM LASER LITHOTRIPSY;  Surgeon: Dustin Gimenez, MD;  Location: [facility] ORS;  Service: Urology;  Laterality: Right;   Social History   Socioeconomic History   Marital status: Widowed    Spouse name: Not on file   Number of children: Not on file   Years of education: Not on  file   Highest education level: Not on file  Occupational History   Not on file  Tobacco Use   Smoking status: Never   Smokeless tobacco: Never  Vaping Use   Vaping status: Never Used  Substance and Sexual Activity   Alcohol use: No    Alcohol/week: 0.0 standard drinks of alcohol   Drug use: No   Sexual activity: Not on file  Other Topics Concern   Not on file  Social History Narrative   Not on file   Social Drivers of Health   Financial Resource Strain: Low Risk  (05/21/2022)   [facility], [facility]   Overall Financial Resource Strain (CARDIA)    Difficulty of Paying Living Expenses: Not very hard  Food Insecurity: No Food Insecurity (05/21/2022)   Received from [facility], [facility]   Hunger Vital Sign    Worried About Running Out of Food in the Last Year: Never true    Ran Out of Food in the Last Year: Never true  Transportation Needs: No Transportation Needs (05/21/2022)   Received [facility], [facility]  PRAPARE - Transportation    In the past 12 months, has lack of transportation kept you from medical appointments or from getting medications?: No    Lack of Transportation (Non-Medical): No  Physical Activity: Not on file  Stress: Not on file  Social Connections: Not on file  Intimate Partner Violence: Not on file   Family History  Problem Relation Age of Onset   Breast cancer Mother 100       deceased 22   Breast cancer Maternal Aunt 16       deceased 47s   Prostate cancer Father        deceased 5   Colon cancer Paternal Aunt 80       deceased 36s    Current Outpatient Medications:    apixaban  (ELIQUIS ) 5 MG TABS tablet, Take 1 tablet (5 mg total) by mouth 2 (two) times daily., Disp: 60 tablet, Rfl: 0   aspirin  EC 81 MG tablet, Take 81 mg by mouth daily as needed. Pt reports taking if she feels her heart "flutter", Disp: ,  Rfl:    diltiazem  (TIADYLT  ER) 120 MG 24 hr capsule, Take 120 mg by mouth daily., Disp: , Rfl:    furosemide  (LASIX ) 40 MG tablet, Take 40 mg by mouth daily., Disp: , Rfl:    labetalol  (NORMODYNE ) 100 MG tablet, Take 100 mg by mouth 2 (two) times daily., Disp: , Rfl:    levothyroxine  (SYNTHROID ) 88 MCG tablet, Take 88 mcg by mouth daily before breakfast. , Disp: , Rfl:    losartan -hydrochlorothiazide  (HYZAAR) 100-25 MG tablet, Take 1 tablet by mouth daily., Disp: , Rfl:    nystatin  (MYCOSTATIN /NYSTOP ) powder, APPLY TO AFFECTED AREA TWICE A DAY, Disp: 15 g, Rfl: 3   pantoprazole  (PROTONIX ) 40 MG tablet, TAKE 1 TABLET BY MOUTH EVERY DAY, Disp: 90 tablet, Rfl: 0   potassium chloride  SA (KLOR-CON  M) 20 MEQ tablet, TAKE 1 TABLET BY MOUTH EVERY DAY, Disp: 90 tablet, Rfl: 0   traMADol  (ULTRAM ) 50 MG tablet, 0.5 tablet to 1 tablet every 8 hours as needed for pain, Disp: 30 tablet, Rfl: 0   Vitamin D , Ergocalciferol , (DRISDOL ) 1.25 MG (50000 UNIT) CAPS capsule, TAKE 1 CAPSULE BY MOUTH ONE TIME PER WEEK, Disp: 12 capsule, Rfl: 3 No current facility-administered medications for this visit.  Facility-Administered Medications Ordered in Other Visits:    cyanocobalamin  (VITAMIN B12) injection 1,000 mcg, 1,000 mcg, Intramuscular, Once, Brahmanday, Govinda R, MD   iron  sucrose (VENOFER ) injection 200 mg, 200 mg, Intravenous, Once, Nelda Balsam, NP   octreotide  (SANDOSTATIN  LAR) 30 MG IM injection, , , ,    octreotide  (SANDOSTATIN  LAR) IM injection 30 mg, 30 mg, Intramuscular, Once, Brahmanday, Govinda R, MD  Physical exam:  Vitals:   06/05/23 1450  BP: 110/74  Pulse: 91  Resp: 15  Temp: 98.1 F (36.7 C)  SpO2: 97%   Physical Exam Vitals reviewed.  Constitutional:      Appearance: She is not ill-appearing.  Cardiovascular:     Rate and Rhythm: Normal rate. Rhythm irregular.  Pulmonary:     Effort: Pulmonary effort is normal. No respiratory distress.     Breath sounds: No wheezing or rales.   Abdominal:     General: There is no distension.     Tenderness: There is no abdominal tenderness.  Neurological:     Mental Status: She is alert and oriented to person, place, and time. Mental status is at baseline.  Psychiatric:  Mood and Affect: Mood normal.        Behavior: Behavior normal.     No results found.  Assessment and plan- Patient is a 83 y.o. female who presents to symptom management clinic for concerns of:   Viral exposure- possible flu exposure though would not recommend prophylaxis without positive flu test in the household. Clinically patient is asymptomatic. Vitals reassuring. She says she feels at baseline. Hospital precautions given if she develops symptoms as she is high risk of decompensation d/t comorbidities. Ok to proceed with treatment for her carcinoid today.   Disposition: F/u as scheduled- la   Visit Diagnosis 1. Viral disease exposure    Patient expressed understanding and was in agreement with this plan. She also understands that She can call clinic at any time with any questions, concerns, or complaints.   Thank you for allowing me to participate in the care of this very pleasant patient.   Kenney Peacemaker, DNP, AGNP-C, AOCNP Cancer Center at [facility] [phone]

## 2023-07-07 ENCOUNTER — Inpatient Hospital Stay (HOSPITAL_BASED_OUTPATIENT_CLINIC_OR_DEPARTMENT_OTHER): Admitting: Internal Medicine

## 2023-07-07 ENCOUNTER — Encounter: Payer: Self-pay | Admitting: Internal Medicine

## 2023-07-07 ENCOUNTER — Inpatient Hospital Stay: Attending: Internal Medicine

## 2023-07-07 ENCOUNTER — Inpatient Hospital Stay

## 2023-07-07 VITALS — BP 124/86 | HR 106 | Temp 97.8°F | Resp 16 | Ht 65.0 in | Wt 204.0 lb

## 2023-07-07 DIAGNOSIS — E3400 Carcinoid syndrome, unspecified: Secondary | ICD-10-CM | POA: Insufficient documentation

## 2023-07-07 DIAGNOSIS — C7A8 Other malignant neuroendocrine tumors: Secondary | ICD-10-CM

## 2023-07-07 DIAGNOSIS — I4891 Unspecified atrial fibrillation: Secondary | ICD-10-CM | POA: Diagnosis not present

## 2023-07-07 DIAGNOSIS — C7B02 Secondary carcinoid tumors of liver: Secondary | ICD-10-CM | POA: Diagnosis not present

## 2023-07-07 DIAGNOSIS — E876 Hypokalemia: Secondary | ICD-10-CM | POA: Insufficient documentation

## 2023-07-07 DIAGNOSIS — D509 Iron deficiency anemia, unspecified: Secondary | ICD-10-CM | POA: Diagnosis not present

## 2023-07-07 DIAGNOSIS — Z7901 Long term (current) use of anticoagulants: Secondary | ICD-10-CM | POA: Insufficient documentation

## 2023-07-07 DIAGNOSIS — C7A019 Malignant carcinoid tumor of the small intestine, unspecified portion: Secondary | ICD-10-CM | POA: Insufficient documentation

## 2023-07-07 DIAGNOSIS — K6389 Other specified diseases of intestine: Secondary | ICD-10-CM

## 2023-07-07 LAB — CMP (CANCER CENTER ONLY)
ALT: 10 U/L (ref 0–44)
AST: 18 U/L (ref 15–41)
Albumin: 3.4 g/dL — ABNORMAL LOW (ref 3.5–5.0)
Alkaline Phosphatase: 76 U/L (ref 38–126)
Anion gap: 10 (ref 5–15)
BUN: 16 mg/dL (ref 8–23)
CO2: 22 mmol/L (ref 22–32)
Calcium: 8.8 mg/dL — ABNORMAL LOW (ref 8.9–10.3)
Chloride: 107 mmol/L (ref 98–111)
Creatinine: 1 mg/dL (ref 0.44–1.00)
GFR, Estimated: 56 mL/min — ABNORMAL LOW (ref >60)
Glucose, Bld: 121 mg/dL — ABNORMAL HIGH (ref 70–99)
Potassium: 3.2 mmol/L — ABNORMAL LOW (ref 3.5–5.1)
Sodium: 139 mmol/L (ref 135–145)
Total Bilirubin: 0.4 mg/dL (ref 0.0–1.2)
Total Protein: 6.3 g/dL — ABNORMAL LOW (ref 6.5–8.1)

## 2023-07-07 LAB — CBC WITH DIFFERENTIAL (CANCER CENTER ONLY)
Abs Immature Granulocytes: 0.02 10*3/uL (ref 0.00–0.07)
Basophils Absolute: 0 10*3/uL (ref 0.0–0.1)
Basophils Relative: 1 %
Eosinophils Absolute: 0.3 10*3/uL (ref 0.0–0.5)
Eosinophils Relative: 5 %
HCT: 27.9 % — ABNORMAL LOW (ref 36.0–46.0)
Hemoglobin: 8.6 g/dL — ABNORMAL LOW (ref 12.0–15.0)
Immature Granulocytes: 0 %
Lymphocytes Relative: 7 %
Lymphs Abs: 0.4 10*3/uL — ABNORMAL LOW (ref 0.7–4.0)
MCH: 26.5 pg (ref 26.0–34.0)
MCHC: 30.8 g/dL (ref 30.0–36.0)
MCV: 85.8 fL (ref 80.0–100.0)
Monocytes Absolute: 0.8 10*3/uL (ref 0.1–1.0)
Monocytes Relative: 13 %
Neutro Abs: 4.6 10*3/uL (ref 1.7–7.7)
Neutrophils Relative %: 74 %
Platelet Count: 253 10*3/uL (ref 150–400)
RBC: 3.25 MIL/uL — ABNORMAL LOW (ref 3.87–5.11)
RDW: 16.5 % — ABNORMAL HIGH (ref 11.5–15.5)
WBC Count: 6.2 10*3/uL (ref 4.0–10.5)
nRBC: 0 % (ref 0.0–0.2)

## 2023-07-07 LAB — VITAMIN B12: Vitamin B-12: 404 pg/mL (ref 180–914)

## 2023-07-07 MED ORDER — POTASSIUM CHLORIDE CRYS ER 20 MEQ PO TBCR: EXTENDED_RELEASE_TABLET | ORAL | 3 refills | Status: DC

## 2023-07-07 MED ORDER — OCTREOTIDE ACETATE 30 MG IM KIT
30.0000 mg | PACK | Freq: Once | INTRAMUSCULAR | Status: AC
  Administered 2023-07-07: 30 mg via INTRAMUSCULAR
  Filled 2023-07-07: qty 1

## 2023-07-07 MED ORDER — IRON SUCROSE 20 MG/ML IV SOLN
200.0000 mg | Freq: Once | INTRAVENOUS | Status: AC
  Administered 2023-07-07: 200 mg via INTRAVENOUS
  Filled 2023-07-07: qty 10

## 2023-07-07 MED ORDER — CYANOCOBALAMIN 1000 MCG/ML IJ SOLN
1000.0000 ug | Freq: Once | INTRAMUSCULAR | Status: AC
  Administered 2023-07-07: 1000 ug via INTRAMUSCULAR
  Filled 2023-07-07: qty 1

## 2023-07-07 NOTE — Assessment & Plan Note (Addendum)
#   Non- functional Small bowel/mesenteric carcinoid; s/p Lutathera  + sandostatin  x4 infusions.  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 Echo March 2023- WNL; NO right sided abnormalities.  #Continue Sandostatin  q M.  No obvious clinical evidence of progression of disease noticed. Will order scan at next visit-   labs today reviewed; see below re: anemia.   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  Declined GI appointment in past.    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   # Hypokalemia-potassium 3.2; secondary to diuretics- increase to 20 meQ BID- new script sent  ## A.fib 2023; March- on eliquis  [Dr.Parashoes Cardiologist];  Monitor closely given history of rectal bleeding--stable.   # b12 def- STOP oral B12 supplementation; proceed with B12 injections once monthly.    # DISPOSITION- # venofer  today;  B12 injection # venofer  weekly x3  #  in 1 month- b12 injection -Sandostatin ; venofer  # Follow up in  2 months-  MD; labs-- B12 levels; cbc/cmp/ chromogranin; b12 injection -Sandostatin ; possible venofer -  Dr.B

## 2023-07-07 NOTE — Progress Notes (Signed)
 [facility] OFFICE PROGRESS NOTE  Patient Care Team: Lyle San, [name] Charlette Console, [name] Percival Brace, [name] Emma Gold, [name] Gwyn Leos, [name]   Cancer Staging  No matching staging information was found for the patient.    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   # FEB 2018- Ga PET- uptake in liver/ mesenteric mass; NOV 2019- increase sandostatin  to 30mg  q monthly.   #S/p Lutathera  x4 every 2 months; last treatment mid September 2022 [Dr.edmunds]; OCT 2022- PET STABLE.   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  In a wheelchair.  Alone.   Emma [name] 83 y.o.  female pleasant patient above history of metastatic carcinoid tumor currently on Sandostatin /; s/p Lutathera  infusions every 2 months ; iron  deficient anemia question AV malformation; A-fib on Eliquis  is here for follow-up.  Patient had recent worsening of diarrhea- with blood in stools. current back to baseline with her chronic diarrhea.  Pt has a good appetite. Denies nausea. Denies pain. Pt has fatigue.  Ambulates at home with a walker. In wheelchair at clinic    Review of Systems  Constitutional:  Positive for malaise/fatigue. Negative for chills, diaphoresis, fever and weight loss.  HENT:  Negative for nosebleeds and sore throat.   Eyes:  Negative for double vision.  Respiratory:  Negative for hemoptysis, sputum production, shortness of breath and wheezing.   Cardiovascular:  Negative for chest pain, palpitations and orthopnea.  Gastrointestinal:  Positive for abdominal pain and diarrhea. Negative for constipation, heartburn, melena, nausea and vomiting.  Genitourinary:  Negative for dysuria, frequency and urgency.  Musculoskeletal:  Positive for back pain. Negative for joint pain.  Skin: Negative.  Negative for itching and rash.  Neurological:  Negative for dizziness, tingling, focal weakness, weakness and headaches.  Endo/Heme/Allergies:  Does not bruise/bleed easily.  Psychiatric/Behavioral:  Negative for depression. The patient is not nervous/anxious and does not have insomnia.       PAST MEDICAL HISTORY :  Past Medical History:  Diagnosis Date   Aromatase inhibitor use    Atrial fibrillation (HCC)    Breast cancer (HCC) 2014  left breast cancer/radiation   Breast cancer, left breast (HCC) 06/19/2014   CHF (congestive heart failure) (HCC)    recent sepsis   Chronic kidney disease    Dizziness    Dyspnea     recently while admitted   GERD (gastroesophageal reflux disease)    Hearing loss    History of hiatal hernia    History of kidney stones    HOH (hard of hearing)    severe   Hypertension    Hypothyroidism    Leg DVT (deep venous thromboembolism), acute (HCC) 07/2015   left leg after fracture   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   Pain    chest pain while admitted   Personal history of radiation therapy 2015   left breast ca   Thyroid  disease     PAST SURGICAL HISTORY :   Past Surgical History:  Procedure Laterality Date   ABDOMINAL HYSTERECTOMY     partial   BREAST LUMPECTOMY Left 01/13/2013   invasive mammary carcinoma, clear margins, LN negative   BREAST LUMPECTOMY WITH SENTINEL LYMPH NODE BIOPSY Left 2014   CHOLECYSTECTOMY     COLONOSCOPY WITH PROPOFOL  N/A 09/17/2017   Procedure: COLONOSCOPY WITH PROPOFOL ;  Surgeon: Luke Salaam, MD;  Location: [facility] ENDOSCOPY;  Service: Gastroenterology;  Laterality: N/A;   CYSTOSCOPY W/ URETERAL STENT PLACEMENT Right 01/07/2016   Procedure: CYSTOSCOPY WITH STENT REPLACEMENT;  Surgeon: Dustin Gimenez, MD;  Location: [facility] ORS;  Service: Urology;  Laterality: Right;   CYSTOSCOPY WITH RETROGRADE PYELOGRAM, URETEROSCOPY AND STENT PLACEMENT Right 12/19/2015   Procedure: CYSTOSCOPY WITH RETROGRADE PYELOGRAM, URETEROSCOPY AND STENT PLACEMENT;  Surgeon: Andrez Banker, MD;  Location: [facility] ORS;  Service: Urology;  Laterality: Right;   URETEROSCOPY WITH HOLMIUM LASER LITHOTRIPSY Right 01/07/2016   Procedure: URETEROSCOPY WITH HOLMIUM LASER LITHOTRIPSY;  Surgeon: Dustin Gimenez, MD;  Location: [facility] ORS;  Service: Urology;  Laterality: Right;    FAMILY HISTORY :   Family History  Problem Relation Age of Onset   Breast cancer Mother 81       deceased 50   Breast cancer Maternal Aunt 29       deceased 18s   Prostate cancer Father        deceased 37   Colon cancer Paternal Aunt 78       deceased 26s    SOCIAL HISTORY:   Social History   Tobacco  Use   Smoking status: Never   Smokeless tobacco: Never  Vaping Use   Vaping status: Never Used  Substance Use Topics   Alcohol use: No    Alcohol/week: 0.0 standard drinks of alcohol   Drug use: No    ALLERGIES:  is allergic to sulfa antibiotics.  MEDICATIONS:  Current Outpatient Medications  Medication Sig Dispense Refill   apixaban  (ELIQUIS ) 5 MG TABS tablet Take 1 tablet (5 mg total) by mouth 2 (two) times daily. 60 tablet 0   aspirin  EC 81 MG tablet Take 81 mg by mouth daily as needed. Pt reports taking if she feels her heart "flutter"     diltiazem  (TIADYLT  ER) 120 MG 24 hr capsule Take 180 mg by mouth daily.     furosemide  (LASIX ) 40 MG tablet Take 40 mg by mouth daily.     labetalol  (NORMODYNE ) 100 MG tablet Take 100 mg by mouth 2 (two) times daily.     levothyroxine  (SYNTHROID ) 88 MCG tablet Take 88 mcg by mouth daily before breakfast.      losartan -hydrochlorothiazide  (HYZAAR) 100-25 MG tablet Take 1  tablet by mouth daily.     nystatin  (MYCOSTATIN /NYSTOP ) powder APPLY TO AFFECTED AREA TWICE A DAY 15 g 3   pantoprazole  (PROTONIX ) 40 MG tablet TAKE 1 TABLET BY MOUTH EVERY DAY 90 tablet 0   traMADol  (ULTRAM ) 50 MG tablet 0.5 tablet to 1 tablet every 8 hours as needed for pain 30 tablet 0   Vitamin D , Ergocalciferol , (DRISDOL ) 1.25 MG (50000 UNIT) CAPS capsule TAKE 1 CAPSULE BY MOUTH ONE TIME PER WEEK 12 capsule 3   potassium chloride  SA (KLOR-CON  M) 20 MEQ tablet One pill twice a day 60 tablet 3   No current facility-administered medications for this visit.   Facility-Administered Medications Ordered in Other Visits  Medication Dose Route Frequency Provider Last Rate Last Admin   cyanocobalamin  (VITAMIN B12) injection 1,000 mcg  1,000 mcg Intramuscular Once Shreena Baines R, MD       iron  sucrose (VENOFER ) injection 200 mg  200 mg Intravenous Once Allen, Lauren G, NP       octreotide  (SANDOSTATIN  LAR) 30 MG IM injection            octreotide  (SANDOSTATIN  LAR) IM  injection 30 mg  30 mg Intramuscular Once Fatim Vanderschaaf R, MD        PHYSICAL EXAMINATION: ECOG PERFORMANCE STATUS: 1 - Symptomatic but completely ambulatory  BP 124/86 (BP Location: Right Arm, Patient Position: Sitting, Cuff Size: Large)   Pulse (!) 106   Temp 97.8 F (36.6 C) (Oral)   Resp 16   Ht 5\' 5"  (1.651 m)   Wt 204 lb (92.5 kg)   SpO2 94%   BMI 33.95 kg/m   Filed Weights   07/07/23 1423  Weight: 204 lb (92.5 kg)         Physical Exam HENT:     Head: Normocephalic and atraumatic.     Mouth/Throat:     Pharynx: No oropharyngeal exudate.  Eyes:     Pupils: Pupils are equal, round, and reactive to light.  Cardiovascular:     Rate and Rhythm: Normal rate. Rhythm irregular.  Pulmonary:     Effort: Pulmonary effort is normal. No respiratory distress.     Breath sounds: Normal breath sounds. No wheezing.  Abdominal:     General: Bowel sounds are normal. There is no distension.     Palpations: Abdomen is soft. There is no mass.     Tenderness: There is no abdominal tenderness. There is no guarding or rebound.  Musculoskeletal:        General: Swelling present. No tenderness. Normal range of motion.     Cervical back: Normal range of motion and neck supple.  Skin:    General: Skin is warm.  Neurological:     Mental Status: She is alert and oriented to person, place, and time.  Psychiatric:        Mood and Affect: Affect normal.     LABORATORY DATA:  I have reviewed the data as listed    Component Value Date/Time   NA 139 07/07/2023 1411   NA 137 07/12/2013 0841   K 3.2 (L) 07/07/2023 1411   K 4.7 07/12/2013 0841   CL 107 07/07/2023 1411   CL 107 07/12/2013 0841   CO2 22 07/07/2023 1411   CO2 20 (L) 07/12/2013 0841   GLUCOSE 121 (H) 07/07/2023 1411   GLUCOSE 122 (H) 07/12/2013 0841   BUN 16 07/07/2023 1411   BUN 18 07/12/2013 0841   CREATININE 1.00 07/07/2023 1411   CREATININE 0.64 05/22/2014  1111   CALCIUM 8.8 (L) 07/07/2023 1411    CALCIUM 9.2 07/12/2013 0841   PROT 6.3 (L) 07/07/2023 1411   PROT 7.0 05/22/2014 1111   ALBUMIN 3.4 (L) 07/07/2023 1411   ALBUMIN 4.2 05/22/2014 1111   AST 18 07/07/2023 1411   ALT 10 07/07/2023 1411   ALT 17 05/22/2014 1111   ALKPHOS 76 07/07/2023 1411   ALKPHOS 95 05/22/2014 1111   BILITOT 0.4 07/07/2023 1411   GFRNONAA 56 (L) 07/07/2023 1411   GFRNONAA >60 05/22/2014 1111   GFRAA >60 10/07/2019 1319   GFRAA >60 05/22/2014 1111    No results found for: "SPEP", "UPEP"  Lab Results  Component Value Date   WBC 6.2 07/07/2023   NEUTROABS 4.6 07/07/2023   HGB 8.6 (L) 07/07/2023   HCT 27.9 (L) 07/07/2023   MCV 85.8 07/07/2023   PLT 253 07/07/2023      Chemistry      Component Value Date/Time   NA 139 07/07/2023 1411   NA 137 07/12/2013 0841   K 3.2 (L) 07/07/2023 1411   K 4.7 07/12/2013 0841   CL 107 07/07/2023 1411   CL 107 07/12/2013 0841   CO2 22 07/07/2023 1411   CO2 20 (L) 07/12/2013 0841   BUN 16 07/07/2023 1411   BUN 18 07/12/2013 0841   CREATININE 1.00 07/07/2023 1411   CREATININE 0.64 05/22/2014 1111      Component Value Date/Time   CALCIUM 8.8 (L) 07/07/2023 1411   CALCIUM 9.2 07/12/2013 0841   ALKPHOS 76 07/07/2023 1411   ALKPHOS 95 05/22/2014 1111   AST 18 07/07/2023 1411   ALT 10 07/07/2023 1411   ALT 17 05/22/2014 1111   BILITOT 0.4 07/07/2023 1411       RADIOGRAPHIC STUDIES: I have personally reviewed the radiological images as listed and agreed with the findings in the report. No results found.   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 Echo March 2023- WNL; NO right sided abnormalities.  #Continue Sandostatin  q M.  No obvious clinical evidence of progression of disease noticed. Will order scan at next visit-   labs today reviewed; see below re: anemia.   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  Declined GI appointment in past.    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   # Hypokalemia-potassium 3.2; secondary to diuretics- increase to 20 meQ BID- new script sent  ## A.fib 2023; March- on eliquis  [Dr.Parashoes Cardiologist];  Monitor closely given history of rectal bleeding--stable.   # b12 def- STOP oral B12 supplementation; proceed with B12 injections once monthly.    # DISPOSITION- # venofer  today;  B12 injection # venofer  weekly x3  #  in 1 month- b12 injection -Sandostatin ; venofer  # Follow up in  2 months-  MD; labs-- B12 levels; cbc/cmp/ chromogranin; b12 injection -Sandostatin ; possible venofer -  Dr.B  Orders Placed This Encounter  Procedures   Vitamin B12    Standing Status:   Future    Expected Date:   09/06/2023    Expiration Date:   07/06/2024   CBC with Differential (Cancer Center Only)    Standing Status:   Future    Expected Date:   09/06/2023    Expiration Date:   07/06/2024   CMP (Cancer Center only)    Standing Status:   Future    Expected Date:   09/06/2023    Expiration Date:   07/06/2024   Chromogranin A    Standing Status:   Future    Expected Date:   09/06/2023    Expiration Date:   07/06/2024    All questions were answered. The patient knows to call the clinic with any problems, questions or concerns.      Gwyn Leos, MD 07/07/2023 3:18 PM

## 2023-07-07 NOTE — Progress Notes (Signed)
 Patient here for follow up; Patient currently followed by cardiology for irregular heart beat. Per patient has f/u Thursday.

## 2023-07-09 ENCOUNTER — Other Ambulatory Visit: Payer: Self-pay | Admitting: Internal Medicine

## 2023-07-09 LAB — CHROMOGRANIN A: Chromogranin A (ng/mL): 122 ng/mL — ABNORMAL HIGH (ref 0.0–101.8)

## 2023-07-10 ENCOUNTER — Encounter: Payer: Self-pay | Admitting: Internal Medicine

## 2023-07-13 ENCOUNTER — Telehealth: Payer: Self-pay | Admitting: *Deleted

## 2023-07-13 NOTE — Telephone Encounter (Signed)
 sHe is starting to have the pain again and so she wanted to see if she can get some tramadol  and its probably been a couple years that she has used it.  So she needs some nystatin  for underneath her breasts.  I will send a message to Dr. Valentine Gasmen about both of these things.

## 2023-07-13 NOTE — Telephone Encounter (Signed)
 sHe is starting to have the pain again and so she wanted to see if she can get some tramadol  and its probably been a couple years that she has used it.  Also she was supposed to get nystatin  for underneath the breast and nobody is called in.

## 2023-07-14 ENCOUNTER — Inpatient Hospital Stay

## 2023-07-14 ENCOUNTER — Other Ambulatory Visit: Payer: Self-pay | Admitting: *Deleted

## 2023-07-14 VITALS — BP 98/65 | HR 103 | Temp 97.2°F | Resp 18

## 2023-07-14 DIAGNOSIS — C7A8 Other malignant neuroendocrine tumors: Secondary | ICD-10-CM

## 2023-07-14 DIAGNOSIS — K6389 Other specified diseases of intestine: Secondary | ICD-10-CM

## 2023-07-14 DIAGNOSIS — C7A019 Malignant carcinoid tumor of the small intestine, unspecified portion: Secondary | ICD-10-CM | POA: Diagnosis not present

## 2023-07-14 MED ORDER — IRON SUCROSE 20 MG/ML IV SOLN
200.0000 mg | Freq: Once | INTRAVENOUS | Status: AC
  Administered 2023-07-14: 200 mg via INTRAVENOUS

## 2023-07-14 MED ORDER — NYSTATIN 100000 UNIT/GM EX POWD: 1.0000 | Freq: Three times a day (TID) | CUTANEOUS | 1 refills | Status: DC

## 2023-07-14 MED ORDER — TRAMADOL HCL 50 MG PO TABS: 50.0000 mg | ORAL_TABLET | Freq: Three times a day (TID) | ORAL | 0 refills | Status: AC | PRN

## 2023-07-14 NOTE — Patient Instructions (Signed)

## 2023-07-15 MED ORDER — SODIUM CHLORIDE 0.9 % IV SOLN: INTRAVENOUS | Status: DC

## 2023-07-16 ENCOUNTER — Ambulatory Visit: Admitting: Certified Registered"

## 2023-07-16 ENCOUNTER — Other Ambulatory Visit: Payer: Self-pay

## 2023-07-16 ENCOUNTER — Encounter
Admission: RE | Disposition: A | Discharge status: Home / Self Care | Payer: Self-pay | Source: Home / Self Care | Attending: Cardiology

## 2023-07-16 ENCOUNTER — Ambulatory Visit
Admission: RE | Admit: 2023-07-16 | Discharge: 2023-07-16 | Disposition: A | Discharge status: Home / Self Care | Attending: Cardiology | Admitting: Cardiology

## 2023-07-16 ENCOUNTER — Encounter: Payer: Self-pay | Admitting: Cardiology

## 2023-07-16 DIAGNOSIS — I480 Paroxysmal atrial fibrillation: Secondary | ICD-10-CM | POA: Insufficient documentation

## 2023-07-16 DIAGNOSIS — I491 Atrial premature depolarization: Secondary | ICD-10-CM | POA: Insufficient documentation

## 2023-07-16 DIAGNOSIS — E1122 Type 2 diabetes mellitus with diabetic chronic kidney disease: Secondary | ICD-10-CM | POA: Insufficient documentation

## 2023-07-16 DIAGNOSIS — I509 Heart failure, unspecified: Secondary | ICD-10-CM | POA: Insufficient documentation

## 2023-07-16 DIAGNOSIS — R0602 Shortness of breath: Secondary | ICD-10-CM | POA: Diagnosis not present

## 2023-07-16 DIAGNOSIS — Z79899 Other long term (current) drug therapy: Secondary | ICD-10-CM | POA: Diagnosis not present

## 2023-07-16 DIAGNOSIS — N189 Chronic kidney disease, unspecified: Secondary | ICD-10-CM | POA: Diagnosis not present

## 2023-07-16 DIAGNOSIS — I493 Ventricular premature depolarization: Secondary | ICD-10-CM | POA: Insufficient documentation

## 2023-07-16 DIAGNOSIS — I130 Hypertensive heart and chronic kidney disease with heart failure and stage 1 through stage 4 chronic kidney disease, or unspecified chronic kidney disease: Secondary | ICD-10-CM | POA: Insufficient documentation

## 2023-07-16 DIAGNOSIS — Z7901 Long term (current) use of anticoagulants: Secondary | ICD-10-CM | POA: Diagnosis not present

## 2023-07-16 DIAGNOSIS — I4891 Unspecified atrial fibrillation: Secondary | ICD-10-CM | POA: Diagnosis present

## 2023-07-16 DIAGNOSIS — Z79890 Hormone replacement therapy (postmenopausal): Secondary | ICD-10-CM | POA: Insufficient documentation

## 2023-07-16 DIAGNOSIS — I4819 Other persistent atrial fibrillation: Secondary | ICD-10-CM

## 2023-07-16 DIAGNOSIS — Z7982 Long term (current) use of aspirin: Secondary | ICD-10-CM | POA: Insufficient documentation

## 2023-07-16 HISTORY — PX: CARDIOVERSION: SHX1299

## 2023-07-16 SURGERY — CARDIOVERSION
Anesthesia: General

## 2023-07-16 MED ORDER — PROPOFOL 10 MG/ML IV BOLUS
INTRAVENOUS | Status: DC | PRN
  Administered 2023-07-16: 50 mg via INTRAVENOUS

## 2023-07-16 NOTE — Transfer of Care (Signed)
 Immediate Anesthesia Transfer of Care Note  Patient: Emma [name]  Procedure(s) Performed: CARDIOVERSION  Patient Location: Short Stay  Anesthesia Type:General  Level of Consciousness: drowsy  Airway & Oxygen Therapy: Patient Spontanous Breathing and Patient connected to nasal cannula oxygen  Post-op Assessment: Report given to RN, Post -op Vital signs reviewed and stable, and Patient moving all extremities  Post vital signs: Reviewed and stable  Last Vitals:  Vitals Value Taken Time  BP 93/48 07/16/23 12:27  Temp 36.5 C 07/16/23 12:27  Pulse 61 07/16/23 12:28  Resp 14 07/16/23 12:28  SpO2 93 % 07/16/23 12:28  Vitals shown include unfiled device data.  Last Pain:  Vitals:   07/16/23 1227  TempSrc: Temporal  PainSc: 0-No pain         Complications: No notable events documented.

## 2023-07-16 NOTE — Anesthesia Postprocedure Evaluation (Signed)
 Anesthesia Post Note  Patient: Emma [name]  Procedure(s) Performed: CARDIOVERSION  Patient location during evaluation: Specials Recovery Anesthesia Type: General Level of consciousness: awake and alert Pain management: pain level controlled Vital Signs Assessment: post-procedure vital signs reviewed and stable Respiratory status: spontaneous breathing, nonlabored ventilation, respiratory function stable and patient connected to nasal cannula oxygen Cardiovascular status: blood pressure returned to baseline and stable Postop Assessment: no apparent nausea or vomiting Anesthetic complications: no   No notable events documented.   Last Vitals:  Vitals:   07/16/23 1245 07/16/23 1300  BP: (!) 96/54 (!) 108/50  Pulse: 62 67  Resp: 14 17  Temp:    SpO2: 91% 95%    Last Pain:  Vitals:   07/16/23 1300  TempSrc:   PainSc: 0-No pain                 Nancey Awkward

## 2023-07-16 NOTE — Op Note (Signed)
 [facility] Cardiology   07/16/2023                     12:29 PM  PATIENT:  Emma [name]    PRE-OPERATIVE DIAGNOSIS:  Cardioversion   Afib  POST-OPERATIVE DIAGNOSIS:  Same  PROCEDURE:  CARDIOVERSION  SURGEON:  Percival Brace, MD    ANESTHESIA:     PREOPERATIVE INDICATIONS:  [name] is a  83 y.o. female with a diagnosis of Cardioversion   Afib who failed conservative measures and elected for surgical management.    The risks benefits and alternatives were discussed with the patient preoperatively including but not limited to the risks of infection, bleeding, cardiopulmonary complications, the need for revision surgery, among others, and the patient was willing to proceed.   OPERATIVE PROCEDURE: The patient presented to special seizures holding area in a fasting state.  Preprocedural ECG revealed atrial fibrillation in the 110 bpm.  The patient received 50 mg of propofol .  Synchronized electrical cardioversion was performed with 100 J with conversion to sinus rhythm.  No periprocedural complications.

## 2023-07-16 NOTE — Anesthesia Preprocedure Evaluation (Addendum)
 Anesthesia Evaluation  Patient identified by MRN, date of birth, ID band Patient awake    Reviewed: Allergy & Precautions, H&P , NPO status , Patient's Chart, lab work & pertinent test results  Airway Mallampati: III  TM Distance: >3 FB Neck ROM: full    Dental  (+) Upper Dentures   Pulmonary shortness of breath and with exertion   Pulmonary exam normal        Cardiovascular hypertension, +CHF  + dysrhythmias Atrial Fibrillation  Rhythm:Irregular Rate:Tachycardia - Systolic murmurs and - Peripheral Edema ECHO 2023:  1. Left ventricular ejection fraction, by estimation, is 60 to 65%. The  left ventricle has normal function. The left ventricle has no regional  wall motion abnormalities. Left ventricular diastolic parameters were  normal.   2. Right ventricular systolic function is normal. The right ventricular  size is normal.   3. The mitral valve is normal in structure. Mild mitral valve  regurgitation. No evidence of mitral stenosis.   4. The aortic valve is normal in structure. Aortic valve regurgitation is  not visualized. No aortic stenosis is present.   5. The inferior vena cava is normal in size with greater than 50%  respiratory variability, suggesting right atrial pressure of 3 mmHg.     Neuro/Psych negative neurological ROS  negative psych ROS   GI/Hepatic Neg liver ROS,GERD  ,,  Endo/Other  Hypothyroidism    Renal/GU Renal InsufficiencyRenal disease  negative genitourinary   Musculoskeletal   Abdominal Normal abdominal exam  (+)   Peds  Hematology negative hematology ROS (+)   Anesthesia Other Findings  83 y.o.  female pleasant patient above history of metastatic carcinoid tumor currently on Sandostatin /; s/p Lutathera  infusions every 2 months ; iron  deficient anemia question AV malformation; A-fib on Eliqui  Past Medical History: No date: Aromatase inhibitor use No date: Atrial fibrillation  (HCC) 2014: Breast cancer (HCC)     Comment:  left breast cancer/radiation 06/19/2014: Breast cancer, left breast (HCC) No date: CHF (congestive heart failure) (HCC)     Comment:  recent sepsis No date: Chronic kidney disease No date: Dizziness No date: Dyspnea     Comment:  recently while admitted No date: GERD (gastroesophageal reflux disease) No date: Hearing loss No date: History of hiatal hernia No date: History of kidney stones No date: HOH (hard of hearing)     Comment:  severe No date: Hypertension No date: Hypothyroidism 07/2015: Leg DVT (deep venous thromboembolism), acute (HCC)     Comment:  left leg after fracture 06/19/2014: Mesenteric mass No date: Pain     Comment:  chest pain while admitted 2015: Personal history of radiation therapy     Comment:  left breast ca No date: Thyroid  disease  Past Surgical History: No date: ABDOMINAL HYSTERECTOMY     Comment:  partial 01/13/2013: BREAST LUMPECTOMY; Left     Comment:  invasive mammary carcinoma, clear margins, LN negative 2014: BREAST LUMPECTOMY WITH SENTINEL LYMPH NODE BIOPSY; Left No date: CHOLECYSTECTOMY 09/17/2017: COLONOSCOPY WITH PROPOFOL ; N/A     Comment:  Procedure: COLONOSCOPY WITH PROPOFOL ;  Surgeon: Luke Salaam, MD;  Location: [facility] ENDOSCOPY;  Service:               Gastroenterology;  Laterality: N/A; 01/07/2016: CYSTOSCOPY W/ URETERAL STENT PLACEMENT; Right     Comment:  Procedure: CYSTOSCOPY WITH STENT REPLACEMENT;  Surgeon:  Dustin Gimenez, MD;  Location: [facility] ORS;  Service:               Urology;  Laterality: Right; 12/19/2015: CYSTOSCOPY WITH RETROGRADE PYELOGRAM, URETEROSCOPY AND  STENT PLACEMENT; Right     Comment:  Procedure: CYSTOSCOPY WITH RETROGRADE PYELOGRAM,               URETEROSCOPY AND STENT PLACEMENT;  Surgeon: Andrez Banker, MD;  Location: [facility] ORS;  Service: Urology;                Laterality: Right; 01/07/2016: URETEROSCOPY WITH  HOLMIUM LASER LITHOTRIPSY; Right     Comment:  Procedure: URETEROSCOPY WITH HOLMIUM LASER LITHOTRIPSY;               Surgeon: Dustin Gimenez, MD;  Location: [facility] ORS;                Service: Urology;  Laterality: Right;  BMI    Body Mass Index: 33.28 kg/m      Reproductive/Obstetrics negative OB ROS                             Anesthesia Physical Anesthesia Plan  ASA: 3  Anesthesia Plan: General   Post-op Pain Management:    Induction: Intravenous  PONV Risk Score and Plan: Propofol  infusion and TIVA  Airway Management Planned: Natural Airway and Nasal Cannula  Additional Equipment:   Intra-op Plan:   Post-operative Plan:   Informed Consent: I have reviewed the patients History and Physical, chart, labs and discussed the procedure including the risks, benefits and alternatives for the proposed anesthesia with the patient or authorized representative who has indicated his/her understanding and acceptance.     Dental Advisory Given  Plan Discussed with: CRNA and Surgeon  Anesthesia Plan Comments:        Anesthesia Quick Evaluation

## 2023-07-17 ENCOUNTER — Encounter: Payer: Self-pay | Admitting: Cardiology

## 2023-07-21 ENCOUNTER — Inpatient Hospital Stay

## 2023-07-21 VITALS — BP 130/53 | HR 62 | Resp 16

## 2023-07-21 DIAGNOSIS — C7A019 Malignant carcinoid tumor of the small intestine, unspecified portion: Secondary | ICD-10-CM | POA: Diagnosis not present

## 2023-07-21 DIAGNOSIS — K6389 Other specified diseases of intestine: Secondary | ICD-10-CM

## 2023-07-21 DIAGNOSIS — C7A8 Other malignant neuroendocrine tumors: Secondary | ICD-10-CM

## 2023-07-21 MED ORDER — IRON SUCROSE 20 MG/ML IV SOLN
200.0000 mg | Freq: Once | INTRAVENOUS | Status: AC
  Administered 2023-07-21: 200 mg via INTRAVENOUS
  Filled 2023-07-21: qty 10

## 2023-07-28 ENCOUNTER — Inpatient Hospital Stay: Attending: Internal Medicine

## 2023-07-28 VITALS — BP 124/58 | HR 62 | Temp 98.0°F | Resp 18

## 2023-07-28 DIAGNOSIS — E538 Deficiency of other specified B group vitamins: Secondary | ICD-10-CM | POA: Diagnosis not present

## 2023-07-28 DIAGNOSIS — C7B02 Secondary carcinoid tumors of liver: Secondary | ICD-10-CM | POA: Insufficient documentation

## 2023-07-28 DIAGNOSIS — C7A019 Malignant carcinoid tumor of the small intestine, unspecified portion: Secondary | ICD-10-CM | POA: Insufficient documentation

## 2023-07-28 DIAGNOSIS — D509 Iron deficiency anemia, unspecified: Secondary | ICD-10-CM | POA: Diagnosis not present

## 2023-07-28 DIAGNOSIS — E3400 Carcinoid syndrome, unspecified: Secondary | ICD-10-CM | POA: Insufficient documentation

## 2023-07-28 DIAGNOSIS — C7A8 Other malignant neuroendocrine tumors: Secondary | ICD-10-CM

## 2023-07-28 DIAGNOSIS — K6389 Other specified diseases of intestine: Secondary | ICD-10-CM

## 2023-07-28 MED ORDER — IRON SUCROSE 20 MG/ML IV SOLN
200.0000 mg | Freq: Once | INTRAVENOUS | Status: AC
  Administered 2023-07-28: 200 mg via INTRAVENOUS

## 2023-07-28 NOTE — Progress Notes (Signed)
 Declined post-observation. Aware of risks. Vitals stable at discharge.

## 2023-07-28 NOTE — Patient Instructions (Signed)

## 2023-08-06 ENCOUNTER — Inpatient Hospital Stay

## 2023-08-06 VITALS — BP 114/66 | HR 96 | Temp 98.2°F | Resp 18

## 2023-08-06 DIAGNOSIS — K6389 Other specified diseases of intestine: Secondary | ICD-10-CM

## 2023-08-06 DIAGNOSIS — C7A019 Malignant carcinoid tumor of the small intestine, unspecified portion: Secondary | ICD-10-CM | POA: Diagnosis not present

## 2023-08-06 DIAGNOSIS — C7A8 Other malignant neuroendocrine tumors: Secondary | ICD-10-CM

## 2023-08-06 MED ORDER — CYANOCOBALAMIN 1000 MCG/ML IJ SOLN
1000.0000 ug | Freq: Once | INTRAMUSCULAR | Status: AC
  Administered 2023-08-06: 1000 ug via INTRAMUSCULAR
  Filled 2023-08-06: qty 1

## 2023-08-06 MED ORDER — OCTREOTIDE ACETATE 30 MG IM KIT
30.0000 mg | PACK | Freq: Once | INTRAMUSCULAR | Status: AC
  Administered 2023-08-06: 30 mg via INTRAMUSCULAR
  Filled 2023-08-06: qty 1

## 2023-08-06 MED ORDER — IRON SUCROSE 20 MG/ML IV SOLN
200.0000 mg | Freq: Once | INTRAVENOUS | Status: AC
  Administered 2023-08-06: 200 mg via INTRAVENOUS
  Filled 2023-08-06: qty 10

## 2023-08-23 ENCOUNTER — Other Ambulatory Visit: Payer: Self-pay | Admitting: Internal Medicine

## 2023-08-24 ENCOUNTER — Encounter: Payer: Self-pay | Admitting: Internal Medicine

## 2023-09-07 ENCOUNTER — Inpatient Hospital Stay (HOSPITAL_BASED_OUTPATIENT_CLINIC_OR_DEPARTMENT_OTHER): Admitting: Internal Medicine

## 2023-09-07 ENCOUNTER — Inpatient Hospital Stay

## 2023-09-07 ENCOUNTER — Encounter: Payer: Self-pay | Admitting: Internal Medicine

## 2023-09-07 ENCOUNTER — Inpatient Hospital Stay: Attending: Internal Medicine

## 2023-09-07 VITALS — BP 112/67 | HR 80 | Temp 98.6°F | Resp 19 | Ht 65.0 in | Wt 200.5 lb

## 2023-09-07 VITALS — BP 114/69 | HR 78 | Temp 95.1°F | Resp 18

## 2023-09-07 DIAGNOSIS — C7A019 Malignant carcinoid tumor of the small intestine, unspecified portion: Secondary | ICD-10-CM | POA: Insufficient documentation

## 2023-09-07 DIAGNOSIS — C7B02 Secondary carcinoid tumors of liver: Secondary | ICD-10-CM | POA: Insufficient documentation

## 2023-09-07 DIAGNOSIS — D509 Iron deficiency anemia, unspecified: Secondary | ICD-10-CM | POA: Diagnosis not present

## 2023-09-07 DIAGNOSIS — E3400 Carcinoid syndrome, unspecified: Secondary | ICD-10-CM | POA: Diagnosis not present

## 2023-09-07 DIAGNOSIS — C7A8 Other malignant neuroendocrine tumors: Secondary | ICD-10-CM

## 2023-09-07 DIAGNOSIS — K6389 Other specified diseases of intestine: Secondary | ICD-10-CM

## 2023-09-07 LAB — CBC WITH DIFFERENTIAL (CANCER CENTER ONLY)
Abs Immature Granulocytes: 0.01 K/uL (ref 0.00–0.07)
Basophils Absolute: 0.1 K/uL (ref 0.0–0.1)
Basophils Relative: 1 %
Eosinophils Absolute: 0.4 K/uL (ref 0.0–0.5)
Eosinophils Relative: 7 %
HCT: 34 % — ABNORMAL LOW (ref 36.0–46.0)
Hemoglobin: 11.2 g/dL — ABNORMAL LOW (ref 12.0–15.0)
Immature Granulocytes: 0 %
Lymphocytes Relative: 9 %
Lymphs Abs: 0.5 K/uL — ABNORMAL LOW (ref 0.7–4.0)
MCH: 28.7 pg (ref 26.0–34.0)
MCHC: 32.9 g/dL (ref 30.0–36.0)
MCV: 87.2 fL (ref 80.0–100.0)
Monocytes Absolute: 0.8 K/uL (ref 0.1–1.0)
Monocytes Relative: 14 %
Neutro Abs: 3.7 K/uL (ref 1.7–7.7)
Neutrophils Relative %: 69 %
Platelet Count: 200 K/uL (ref 150–400)
RBC: 3.9 MIL/uL (ref 3.87–5.11)
RDW: 19.7 % — ABNORMAL HIGH (ref 11.5–15.5)
WBC Count: 5.4 K/uL (ref 4.0–10.5)
nRBC: 0 % (ref 0.0–0.2)

## 2023-09-07 LAB — CMP (CANCER CENTER ONLY)
ALT: 11 U/L (ref 0–44)
AST: 20 U/L (ref 15–41)
Albumin: 3.3 g/dL — ABNORMAL LOW (ref 3.5–5.0)
Alkaline Phosphatase: 84 U/L (ref 38–126)
Anion gap: 9 (ref 5–15)
BUN: 16 mg/dL (ref 8–23)
CO2: 22 mmol/L (ref 22–32)
Calcium: 9.2 mg/dL (ref 8.9–10.3)
Chloride: 105 mmol/L (ref 98–111)
Creatinine: 0.9 mg/dL (ref 0.44–1.00)
GFR, Estimated: >60 mL/min (ref >60)
Glucose, Bld: 112 mg/dL — ABNORMAL HIGH (ref 70–99)
Potassium: 3.1 mmol/L — ABNORMAL LOW (ref 3.5–5.1)
Sodium: 136 mmol/L (ref 135–145)
Total Bilirubin: 0.6 mg/dL (ref 0.0–1.2)
Total Protein: 6.2 g/dL — ABNORMAL LOW (ref 6.5–8.1)

## 2023-09-07 LAB — VITAMIN B12: Vitamin B-12: 227 pg/mL (ref 180–914)

## 2023-09-07 MED ORDER — SODIUM CHLORIDE 0.9% FLUSH
10.0000 mL | INTRAVENOUS | Status: DC | PRN
Start: 2023-09-07 — End: 2023-09-07
  Administered 2023-09-07 (×2): 10 mL
  Filled 2023-09-07: qty 10

## 2023-09-07 MED ORDER — OCTREOTIDE ACETATE 30 MG IM KIT
30.0000 mg | PACK | Freq: Once | INTRAMUSCULAR | Status: AC
Start: 2023-09-07 — End: 2023-09-07
  Administered 2023-09-07 (×2): 30 mg via INTRAMUSCULAR
  Filled 2023-09-07: qty 1

## 2023-09-07 MED ORDER — IRON SUCROSE 20 MG/ML IV SOLN
200.0000 mg | Freq: Once | INTRAVENOUS | Status: AC
Start: 2023-09-07 — End: 2023-09-07
  Administered 2023-09-07 (×2): 200 mg via INTRAVENOUS
  Filled 2023-09-07: qty 10

## 2023-09-07 MED ORDER — CYANOCOBALAMIN 1000 MCG/ML IJ SOLN
1000.0000 ug | Freq: Once | INTRAMUSCULAR | Status: AC
  Administered 2023-09-07 (×2): 1000 ug via INTRAMUSCULAR
  Filled 2023-09-07: qty 1

## 2023-09-07 NOTE — Progress Notes (Signed)
 Patient has no concerns.   Patient states hear aids are broken so MD will have to speak up loudly.

## 2023-09-07 NOTE — Progress Notes (Signed)
 [facility] OFFICE PROGRESS NOTE  Patient Care Team: Valora Agent, [name] Donette Ellouise LABOR, [name] Ammon Blunt, [name] Epifanio Alm SQUIBB, [name] Rennie Emma SAUNDERS, [name]   Cancer Staging  No matching staging information was found for the patient.    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   # FEB 2018- Ga PET- uptake in liver/ mesenteric mass; NOV 2019- increase sandostatin  to 30mg  q monthly.   #S/p Lutathera  x4 every 2 months; last treatment mid September 2022 [Dr.edmunds]; OCT 2022- PET STABLE.   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  In a wheelchair.  Alone.   Emma [name] 83 y.o.  female pleasant patient above history of metastatic carcinoid tumor currently on Sandostatin /; s/p Lutathera  infusions every 2 months ; iron  deficient anemia question AV malformation; A-fib on Eliquis  is here for follow-up.  In the interim patient was evaluated by cardiology-for A-fib uncontrolled s/p cardioversion.   Currently no further diarrhea no further blood in stools.  She continues current Eliquis .  Pt has a good appetite. Denies nausea. Denies pain. Pt has fatigue.  Ambulates at home with a walker. In wheelchair at clinic    Review of Systems  Constitutional:  Positive for malaise/fatigue. Negative for chills, diaphoresis, fever and weight loss.  HENT:  Negative for nosebleeds and sore throat.   Eyes:  Negative for double vision.  Respiratory:  Negative for hemoptysis, sputum production, shortness of breath and wheezing.   Cardiovascular:  Negative for chest pain, palpitations and orthopnea.  Gastrointestinal:  Positive for abdominal pain and diarrhea. Negative for constipation, heartburn, melena, nausea and vomiting.  Genitourinary:  Negative for dysuria, frequency and urgency.  Musculoskeletal:  Positive for back pain. Negative for joint pain.  Skin: Negative.  Negative for itching and rash.  Neurological:  Negative for dizziness, tingling, focal weakness, weakness and headaches.  Endo/Heme/Allergies:  Does not bruise/bleed easily.  Psychiatric/Behavioral:  Negative for depression. The patient is not nervous/anxious and does not have insomnia.       PAST MEDICAL HISTORY :  Past Medical History:  Diagnosis Date   Aromatase inhibitor use  Atrial fibrillation (HCC)    Breast cancer (HCC) 2014   left breast cancer/radiation   Breast cancer, left breast (HCC) 06/19/2014   CHF (congestive heart failure) (HCC)     recent sepsis   Chronic kidney disease    Dizziness    Dyspnea    recently while admitted   GERD (gastroesophageal reflux disease)    Hearing loss    History of hiatal hernia    History of kidney stones    HOH (hard of hearing)    severe   Hypertension    Hypothyroidism    Leg DVT (deep venous thromboembolism), acute (HCC) 07/2015   left leg after fracture   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   Pain    chest pain while admitted   Personal history of radiation therapy 2015   left breast ca   Thyroid  disease     PAST SURGICAL HISTORY :   Past Surgical History:  Procedure Laterality Date   ABDOMINAL HYSTERECTOMY     partial   BREAST LUMPECTOMY Left 01/13/2013   invasive mammary carcinoma, clear margins, LN negative   BREAST LUMPECTOMY WITH SENTINEL LYMPH NODE BIOPSY Left 2014   CARDIOVERSION N/A 07/16/2023   Procedure: CARDIOVERSION;  Surgeon: Ammon Blunt, MD;  Location: [facility] ORS;  Service: Cardiovascular;  Laterality: N/A;   CHOLECYSTECTOMY     COLONOSCOPY WITH PROPOFOL  N/A 09/17/2017   Procedure: COLONOSCOPY WITH PROPOFOL ;  Surgeon: Therisa Bi, MD;  Location: [facility] ENDOSCOPY;  Service: Gastroenterology;  Laterality: N/A;   CYSTOSCOPY W/ URETERAL STENT PLACEMENT Right 01/07/2016   Procedure: CYSTOSCOPY WITH STENT REPLACEMENT;  Surgeon: Rosina Riis, MD;  Location: [facility] ORS;  Service: Urology;  Laterality: Right;   CYSTOSCOPY WITH RETROGRADE PYELOGRAM, URETEROSCOPY AND STENT PLACEMENT Right 12/19/2015   Procedure: CYSTOSCOPY WITH RETROGRADE PYELOGRAM, URETEROSCOPY AND STENT PLACEMENT;  Surgeon: Morene LELON Salines, MD;  Location: [facility] ORS;  Service: Urology;  Laterality: Right;   URETEROSCOPY WITH HOLMIUM LASER LITHOTRIPSY Right 01/07/2016   Procedure: URETEROSCOPY WITH HOLMIUM LASER LITHOTRIPSY;  Surgeon: Rosina Riis, MD;  Location: [facility] ORS;  Service: Urology;  Laterality: Right;    FAMILY HISTORY :   Family History  Problem Relation Age of Onset   Breast cancer  Mother 21       deceased 41   Breast cancer Maternal Aunt 35       deceased 59s   Prostate cancer Father        deceased 9   Colon cancer Paternal Aunt 77       deceased 33s    SOCIAL HISTORY:   Social History   Tobacco Use   Smoking status: Never   Smokeless tobacco: Never  Vaping Use   Vaping status: Never Used  Substance Use Topics   Alcohol use: No    Alcohol/week: 0.0 standard drinks of alcohol   Drug use: No    ALLERGIES:  is allergic to sulfa antibiotics.  MEDICATIONS:  Current Outpatient Medications  Medication Sig Dispense Refill   apixaban  (ELIQUIS ) 5 MG TABS tablet Take 1 tablet (5 mg total) by mouth 2 (two) times daily. 60 tablet 0   diltiazem  (CARDIZEM  CD) 180 MG 24 hr capsule Take 180 mg by mouth daily.     diltiazem  (CARDIZEM ) 30 MG tablet Take 30 mg by mouth.     furosemide  (LASIX ) 40 MG tablet Take 40 mg by mouth daily as needed for edema.     labetalol  (NORMODYNE ) 100 MG tablet Take 100 mg by mouth 2 (two) times daily.  levothyroxine  (SYNTHROID ) 88 MCG tablet Take 88 mcg by mouth daily before breakfast.      losartan -hydrochlorothiazide  (HYZAAR) 100-25 MG tablet Take 1 tablet by mouth daily.     nystatin  (MYCOSTATIN /NYSTOP ) powder Apply 1 Application topically 3 (three) times daily. Use powder under breasts were the rash three times a day til goes away 60 g 1   potassium chloride  SA (KLOR-CON  M) 20 MEQ tablet TAKE 1 TABLET BY MOUTH EVERY DAY 90 tablet 1   traMADol  (ULTRAM ) 50 MG tablet Take 1 tablet (50 mg total) by mouth every 8 (eight) hours as needed. 30 tablet 0   Vitamin D , Ergocalciferol , (DRISDOL ) 1.25 MG (50000 UNIT) CAPS capsule TAKE 1 CAPSULE BY MOUTH ONE TIME PER WEEK 12 capsule 3   No current facility-administered medications for this visit.   Facility-Administered Medications Ordered in Other Visits  Medication Dose Route Frequency Provider Last Rate Last Admin   cyanocobalamin  (VITAMIN B12) injection 1,000 mcg  1,000 mcg Intramuscular  Once Merlyn Conley R, MD       iron  sucrose (VENOFER ) injection 200 mg  200 mg Intravenous Once Allen, Lauren G, NP       octreotide  (SANDOSTATIN  LAR) 30 MG IM injection            octreotide  (SANDOSTATIN  LAR) IM injection 30 mg  30 mg Intramuscular Once Renette Hsu R, MD       sodium chloride  flush (NS) 0.9 % injection 10 mL  10 mL Intracatheter PRN Matthew Pais R, MD        PHYSICAL EXAMINATION: ECOG PERFORMANCE STATUS: 1 - Symptomatic but completely ambulatory  BP 112/67   Pulse 80   Temp 98.6 F (37 C)   Resp 19   Ht 5' 5 (1.651 m)   Wt 200 lb 8 oz (90.9 kg)   SpO2 95%   BMI 33.36 kg/m   Filed Weights   09/07/23 1254  Weight: 200 lb 8 oz (90.9 kg)         Physical Exam HENT:     Head: Normocephalic and atraumatic.     Mouth/Throat:     Pharynx: No oropharyngeal exudate.  Eyes:     Pupils: Pupils are equal, round, and reactive to light.  Cardiovascular:     Rate and Rhythm: Normal rate. Rhythm irregular.  Pulmonary:     Effort: Pulmonary effort is normal. No respiratory distress.     Breath sounds: Normal breath sounds. No wheezing.  Abdominal:     General: Bowel sounds are normal. There is no distension.     Palpations: Abdomen is soft. There is no mass.     Tenderness: There is no abdominal tenderness. There is no guarding or rebound.  Musculoskeletal:        General: Swelling present. No tenderness. Normal range of motion.     Cervical back: Normal range of motion and neck supple.  Skin:    General: Skin is warm.  Neurological:     Mental Status: She is alert and oriented to person, place, and time.  Psychiatric:        Mood and Affect: Affect normal.     LABORATORY DATA:  I have reviewed the data as listed    Component Value Date/Time   NA 136 09/07/2023 1300   NA 137 07/12/2013 0841   K 3.1 (L) 09/07/2023 1300   K 4.7 07/12/2013 0841   CL 105 09/07/2023 1300   CL 107 07/12/2013 0841   CO2 22 09/07/2023 1300  CO2 20  (L) 07/12/2013 0841   GLUCOSE 112 (H) 09/07/2023 1300   GLUCOSE 122 (H) 07/12/2013 0841   BUN 16 09/07/2023 1300   BUN 18 07/12/2013 0841   CREATININE 0.90 09/07/2023 1300   CREATININE 0.64 05/22/2014 1111   CALCIUM 9.2 09/07/2023 1300   CALCIUM 9.2 07/12/2013 0841   PROT 6.2 (L) 09/07/2023 1300   PROT 7.0 05/22/2014 1111   ALBUMIN 3.3 (L) 09/07/2023 1300   ALBUMIN 4.2 05/22/2014 1111   AST 20 09/07/2023 1300   ALT 11 09/07/2023 1300   ALT 17 05/22/2014 1111   ALKPHOS 84 09/07/2023 1300   ALKPHOS 95 05/22/2014 1111   BILITOT 0.6 09/07/2023 1300   GFRNONAA >60 09/07/2023 1300   GFRNONAA >60 05/22/2014 1111   GFRAA >60 10/07/2019 1319   GFRAA >60 05/22/2014 1111    No results found for: SPEP, UPEP  Lab Results  Component Value Date   WBC 5.4 09/07/2023   NEUTROABS 3.7 09/07/2023   HGB 11.2 (L) 09/07/2023   HCT 34.0 (L) 09/07/2023   MCV 87.2 09/07/2023   PLT 200 09/07/2023      Chemistry      Component Value Date/Time   NA 136 09/07/2023 1300   NA 137 07/12/2013 0841   K 3.1 (L) 09/07/2023 1300   K 4.7 07/12/2013 0841   CL 105 09/07/2023 1300   CL 107 07/12/2013 0841   CO2 22 09/07/2023 1300   CO2 20 (L) 07/12/2013 0841   BUN 16 09/07/2023 1300   BUN 18 07/12/2013 0841   CREATININE 0.90 09/07/2023 1300   CREATININE 0.64 05/22/2014 1111      Component Value Date/Time   CALCIUM 9.2 09/07/2023 1300   CALCIUM 9.2 07/12/2013 0841   ALKPHOS 84 09/07/2023 1300   ALKPHOS 95 05/22/2014 1111   AST 20 09/07/2023 1300   ALT 11 09/07/2023 1300   ALT 17 05/22/2014 1111   BILITOT 0.6 09/07/2023 1300       RADIOGRAPHIC STUDIES: I have personally reviewed the radiological images as listed and agreed with the findings in the report. No results found.   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  JAN 2025-  No interval change from comparison  DOTATATE PET scan 11/14/2020; Well differentiated neuroendocrine tumor solitary metastasis in the LEFT hepatic lobe;  Central mesenteric neuroendocrine tumor mass with calcifications and tethered bowel in the central RIGHT abdomen. 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 Echo March 2023- WNL; NO right sided abnormalities.  # Continue Sandostatin  q M.  No obvious clinical evidence of progression of disease noticed.   labs today reviewed; see below re: anemia.  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  Declined GI appointment in past.    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   # Hypokalemia-potassium 3.1; secondary to diuretics-recommended compliance with 20 meQ BID--stable.   ## A.fib 2023; March- on eliquis  [Dr.Parashoes Cardiologist];  Monitor closely given history of rectal bleeding--s/p cardioversion 07/16/2023, however, failed to maintain sinus rhythm-overall stable.  # b12 def- STOP oral B12 supplementation; proceed with B12 injections once monthly.    #  DISPOSITION- # venofer  today;  B12 injection; sandostatin  #  in 1 month- b12 injection -Sandostatin ; venofer  # Follow up in  2 months-  MD; labs-- B12 levels; cbc/cmp/ chromogranin; b12 injection -Sandostatin ; possible venofer -  Dr.B    No orders of the defined types were placed in this encounter.   All questions were answered. The patient knows to call the clinic with any problems, questions or concerns.      Emma JONELLE Joe, MD 09/07/2023 2:11 PM

## 2023-09-07 NOTE — Addendum Note (Signed)
 Addended by: LAEL BROWNING A on: 09/07/2023 02:15 PM   Modules accepted: Orders

## 2023-09-07 NOTE — Assessment & Plan Note (Addendum)
#   Non- functional Small bowel/mesenteric carcinoid; s/p Lutathera  + sandostatin  x4 infusions.  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 Echo March 2023- WNL; NO right sided abnormalities.  # Continue Sandostatin  q M.  No obvious clinical evidence of progression of disease noticed.   labs today reviewed; see below re: anemia.  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  Declined GI appointment in past.    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   # Hypokalemia-potassium 3.1; secondary to diuretics-recommended compliance with 20 meQ BID--stable.   ## A.fib 2023; March- on eliquis  [Dr.Parashoes Cardiologist];  Monitor closely given history of rectal bleeding--s/p cardioversion 07/16/2023, however, failed to maintain sinus rhythm-overall stable.  # b12 def- STOP oral B12 supplementation; proceed with B12 injections once monthly.    # DISPOSITION- # venofer  today;  B12 injection; sandostatin  #  in 1 month- b12 injection -Sandostatin ; venofer  # Follow up in  2 months-  MD; labs-- B12 levels; cbc/cmp/ chromogranin; b12 injection -Sandostatin ; possible venofer -  Dr.B

## 2023-09-09 LAB — CHROMOGRANIN A: Chromogranin A (ng/mL): 162 ng/mL — ABNORMAL HIGH (ref 0.0–101.8)

## 2023-09-14 ENCOUNTER — Other Ambulatory Visit: Payer: Self-pay | Admitting: Internal Medicine

## 2023-10-08 ENCOUNTER — Inpatient Hospital Stay: Attending: Internal Medicine

## 2023-10-08 VITALS — BP 123/87 | HR 95 | Temp 98.0°F | Resp 18

## 2023-10-08 DIAGNOSIS — C7A019 Malignant carcinoid tumor of the small intestine, unspecified portion: Secondary | ICD-10-CM | POA: Diagnosis present

## 2023-10-08 DIAGNOSIS — C7B02 Secondary carcinoid tumors of liver: Secondary | ICD-10-CM | POA: Diagnosis present

## 2023-10-08 DIAGNOSIS — E538 Deficiency of other specified B group vitamins: Secondary | ICD-10-CM | POA: Insufficient documentation

## 2023-10-08 DIAGNOSIS — D509 Iron deficiency anemia, unspecified: Secondary | ICD-10-CM | POA: Diagnosis not present

## 2023-10-08 DIAGNOSIS — E3400 Carcinoid syndrome, unspecified: Secondary | ICD-10-CM | POA: Insufficient documentation

## 2023-10-08 DIAGNOSIS — C7A8 Other malignant neuroendocrine tumors: Secondary | ICD-10-CM

## 2023-10-08 DIAGNOSIS — K6389 Other specified diseases of intestine: Secondary | ICD-10-CM

## 2023-10-08 MED ORDER — IRON SUCROSE 20 MG/ML IV SOLN
200.0000 mg | Freq: Once | INTRAVENOUS | Status: AC
  Administered 2023-10-08: 200 mg via INTRAVENOUS
  Filled 2023-10-08: qty 10

## 2023-10-08 MED ORDER — CYANOCOBALAMIN 1000 MCG/ML IJ SOLN
1000.0000 ug | Freq: Once | INTRAMUSCULAR | Status: AC
  Administered 2023-10-08: 1000 ug via INTRAMUSCULAR
  Filled 2023-10-08: qty 1

## 2023-10-08 MED ORDER — OCTREOTIDE ACETATE 30 MG IM KIT
30.0000 mg | PACK | Freq: Once | INTRAMUSCULAR | Status: AC
  Administered 2023-10-08: 30 mg via INTRAMUSCULAR
  Filled 2023-10-08: qty 1

## 2023-10-08 NOTE — Patient Instructions (Signed)
 Iron  Sucrose Injection What is this medication? IRON  SUCROSE (EYE ern SOO krose) treats low levels of iron  (iron  deficiency anemia) in people with kidney disease. Iron  is a mineral that plays an important role in making red blood cells, which carry oxygen from your lungs to the rest of your body. This medicine may be used for other purposes; ask your health care provider or pharmacist if you have questions. COMMON BRAND NAME(S): Venofer  What should I tell my care team before I take this medication? They need to know if you have any of these conditions: Anemia not caused by low iron  levels Heart disease High levels of iron  in the blood Kidney disease Liver disease An unusual or allergic reaction to iron , other medications, foods, dyes, or preservatives Pregnant or trying to get pregnant Breastfeeding How should I use this medication? This medication is infused into a vein. It is given by your care team in a hospital or clinic setting. Talk to your care team about the use of this medication in children. While it may be prescribed for children as young as 2 years for selected conditions, precautions do apply. Overdosage: If you think you have taken too much of this medicine contact a poison control center or emergency room at once. NOTE: This medicine is only for you. Do not share this medicine with others. What if I miss a dose? Keep appointments for follow-up doses. It is important not to miss your dose. Call your care team if you are unable to keep an appointment. What may interact with this medication? Do not take this medication with any of the following: Deferoxamine Dimercaprol Other iron  products This medication may also interact with the following: Chloramphenicol Deferasirox This list may not describe all possible interactions. Give your health care provider a list of all the medicines, herbs, non-prescription drugs, or dietary supplements you use. Also tell them if you smoke,  drink alcohol, or use illegal drugs. Some items may interact with your medicine. What should I watch for while using this medication? Visit your care team for regular checks on your progress. Tell your care team if your symptoms do not start to get better or if they get worse. You may need blood work done while you are taking this medication. You may need to eat more foods that contain iron . Talk to your care team. Foods that contain iron  include whole grains or cereals, dried fruits, beans, peas, leafy green vegetables, and organ meats (liver, kidney). What side effects may I notice from receiving this medication? Side effects that you should report to your care team as soon as possible: Allergic reactions--skin rash, itching, hives, swelling of the face, lips, tongue, or throat Low blood pressure--dizziness, feeling faint or lightheaded, blurry vision Shortness of breath Side effects that usually do not require medical attention (report to your care team if they continue or are bothersome): Flushing Headache Joint pain Muscle pain Nausea Pain, redness, or irritation at injection site This list may not describe all possible side effects. Call your doctor for medical advice about side effects. You may report side effects to FDA at 1-800-FDA-1088. Where should I keep my medication? This medication is given in a hospital or clinic. It will not be stored at home. NOTE: This sheet is a summary. It may not cover all possible information. If you have questions about this medicine, talk to your doctor, pharmacist, or health care provider.  2024 Elsevier/Gold Standard (2022-09-03 00:00:00)  Vitamin B12 Injection What is this medication?  Vitamin B12 (VAHY tuh min B12) prevents and treats low vitamin B12 levels in your body. It is used in people who do not get enough vitamin B12 from their diet or when their digestive tract does not absorb enough. Vitamin B12 plays an important role in maintaining  the health of your nervous system and red blood cells. This medicine may be used for other purposes; ask your health care provider or pharmacist if you have questions. COMMON BRAND NAME(S): B-12 Compliance Kit, B-12 Injection Kit, Cyomin, Dodex , LA-12, Nutri-Twelve, Physicians EZ Use B-12, Primabalt, Vitamin Deficiency Injectable System - B12 What should I tell my care team before I take this medication? They need to know if you have any of these conditions: Kidney disease Leber's disease Megaloblastic anemia An unusual or allergic reaction to cyanocobalamin , cobalt, other medications, foods, dyes, or preservatives Pregnant or trying to get pregnant Breast-feeding How should I use this medication? This medication is injected into a muscle or deeply under the skin. It is usually given in a clinic or care team's office. However, your care team may teach you how to inject yourself. Follow all instructions. Talk to your care team about the use of this medication in children. Special care may be needed. Overdosage: If you think you have taken too much of this medicine contact a poison control center or emergency room at once. NOTE: This medicine is only for you. Do not share this medicine with others. What if I miss a dose? If you are given your dose at a clinic or care team's office, call to reschedule your appointment. If you give your own injections, and you miss a dose, take it as soon as you can. If it is almost time for your next dose, take only that dose. Do not take double or extra doses. What may interact with this medication? Alcohol Colchicine This list may not describe all possible interactions. Give your health care provider a list of all the medicines, herbs, non-prescription drugs, or dietary supplements you use. Also tell them if you smoke, drink alcohol, or use illegal drugs. Some items may interact with your medicine. What should I watch for while using this medication? Visit your  care team regularly. You may need blood work done while you are taking this medication. You may need to follow a special diet. Talk to your care team. Limit your alcohol intake and avoid smoking to get the best benefit. What side effects may I notice from receiving this medication? Side effects that you should report to your care team as soon as possible: Allergic reactions--skin rash, itching, hives, swelling of the face, lips, tongue, or throat Swelling of the ankles, hands, or feet Trouble breathing Side effects that usually do not require medical attention (report to your care team if they continue or are bothersome): Diarrhea This list may not describe all possible side effects. Call your doctor for medical advice about side effects. You may report side effects to FDA at 1-800-FDA-1088. Where should I keep my medication? Keep out of the reach of children. Store at room temperature between 15 and 30 degrees C (59 and 85 degrees F). Protect from light. Throw away any unused medication after the expiration date. NOTE: This sheet is a summary. It may not cover all possible information. If you have questions about this medicine, talk to your doctor, pharmacist, or health care provider.  2024 Elsevier/Gold Standard (2020-09-25 00:00:00)  Octreotide  Injection Solution What is this medication? OCTREOTIDE  (ok TREE oh tide) treats  high levels of growth hormone (acromegaly). It works by reducing the amount of growth hormone your body makes. This reduces symptoms and the risk of health problems caused by too much growth hormone, such as diabetes and heart disease. It may also be used to treat diarrhea caused by neuroendocrine tumors. It works by slowing down the release of serotonin from the tumor cells. This reduces the number of bowel movements you have. This medicine may be used for other purposes; ask your health care provider or pharmacist if you have questions. COMMON BRAND NAME(S): Bynfezia,  Sandostatin  What should I tell my care team before I take this medication? They need to know if you have any of these conditions: Diabetes Gallbladder disease Heart disease Kidney disease Liver disease Pancreatic disease Thyroid  disease An unusual or allergic reaction to octreotide , other medications, foods, dyes, or preservatives Pregnant or trying to get pregnant Breastfeeding How should I use this medication? This medication is injected under the skin or into a vein. It is usually given by your care team in a hospital or clinic setting. If you get this medication at home, you will be taught how to prepare and give it. Use exactly as directed. Take it as directed on the prescription label at the same time every day. Keep taking it unless your care team tells you to stop. Allow the injection solution to come to room temperature before use. Do not warm it artificially. It is important that you put your used needles and syringes in a special sharps container. Do not put them in a trash can. If you do not have a sharps container, call your pharmacist or care team to get one. Talk to your care team about the use of this medication in children. Special care may be needed. Overdosage: If you think you have taken too much of this medicine contact a poison control center or emergency room at once. NOTE: This medicine is only for you. Do not share this medicine with others. What if I miss a dose? If you miss a dose, take it as soon as you can. If it is almost time for your next dose, take only that dose. Do not take double or extra doses. What may interact with this medication? Bromocriptine Certain medications for blood pressure, heart disease, irregular heartbeat Cyclosporine Diuretics Medications for diabetes, including insulin Quinidine This list may not describe all possible interactions. Give your health care provider a list of all the medicines, herbs, non-prescription drugs, or dietary  supplements you use. Also tell them if you smoke, drink alcohol, or use illegal drugs. Some items may interact with your medicine. What should I watch for while using this medication? Visit your care team for regular checks on your progress. Tell your care team if your symptoms do not start to get better or if they get worse. To help reduce irritation at the injection site, use a different site for each injection and make sure the solution is at room temperature before use. This medication may cause decreases in blood sugar. Signs of low blood sugar include chills, cool, pale skin or cold sweats, drowsiness, extreme hunger, fast heartbeat, headache, nausea, nervousness or anxiety, shakiness, trembling, unsteadiness, tiredness, or weakness. Contact your care team right away if you experience any of these symptoms. This medication may increase blood sugar. The risk may be higher in patients who already have diabetes. Ask your care team what you can do to lower your risk of diabetes while taking this medication. You  should make sure you get enough vitamin B12 while you are taking this medication. Discuss the foods you eat and the vitamins you take with your care team. What side effects may I notice from receiving this medication? Side effects that you should report to your care team as soon as possible: Allergic reactions--skin rash, itching, hives, swelling of the face, lips, tongue, or throat Gallbladder problems--severe stomach pain, nausea, vomiting, fever Heart rhythm changes--fast or irregular heartbeat, dizziness, feeling faint or lightheaded, chest pain, trouble breathing High blood sugar (hyperglycemia)--increased thirst or amount of urine, unusual weakness or fatigue, blurry vision Low blood sugar (hypoglycemia)--tremors or shaking, anxiety, sweating, cold or clammy skin, confusion, dizziness, rapid heartbeat Low thyroid  levels (hypothyroidism)--unusual weakness or fatigue, increased sensitivity  to cold, constipation, hair loss, dry skin, weight gain, feelings of depression Low vitamin B12 level--pain, tingling, or numbness in the hands or feet, muscle weakness, dizziness, confusion, trouble concentrating Oily or light-colored stools, diarrhea, bloating, weight loss Pancreatitis--severe stomach pain that spreads to your back or gets worse after eating or when touched, fever, nausea, vomiting Slow heartbeat--dizziness, feeling faint or lightheaded, confusion, trouble breathing, unusual weakness or fatigue Side effects that usually do not require medical attention (report these to your care team if they continue or are bothersome): Diarrhea Dizziness Headache Nausea Pain, redness, or irritation at injection site Stomach pain This list may not describe all possible side effects. Call your doctor for medical advice about side effects. You may report side effects to FDA at 1-800-FDA-1088. Where should I keep my medication? Keep out of the reach of children and pets. Store in the refrigerator. Protect from light. Allow to come to room temperature naturally. Do not use artificial heat. If protected from light, the injection may be stored between 20 and 30 degrees C (70 and 86 degrees F) for 14 days. After the initial use, throw away any unused portion of a multiple dose vial after 14 days. Get rid of any unused portions of the ampules after use. To get rid of medications that are no longer needed or have expired: Take the medication to a medication take-back program. Ask your pharmacy or law enforcement to find a location. If you cannot return the medication, ask your pharmacist or care team how to get rid of the medication safely. NOTE: This sheet is a summary. It may not cover all possible information. If you have questions about this medicine, talk to your doctor, pharmacist, or health care provider.  2024 Elsevier/Gold Standard (2022-12-26 00:00:00)

## 2023-11-06 ENCOUNTER — Inpatient Hospital Stay (HOSPITAL_BASED_OUTPATIENT_CLINIC_OR_DEPARTMENT_OTHER): Admitting: Nurse Practitioner

## 2023-11-06 ENCOUNTER — Other Ambulatory Visit

## 2023-11-06 ENCOUNTER — Inpatient Hospital Stay: Attending: Internal Medicine

## 2023-11-06 ENCOUNTER — Other Ambulatory Visit: Payer: Self-pay | Admitting: *Deleted

## 2023-11-06 ENCOUNTER — Encounter: Payer: Self-pay | Admitting: Nurse Practitioner

## 2023-11-06 ENCOUNTER — Ambulatory Visit: Admitting: Internal Medicine

## 2023-11-06 ENCOUNTER — Ambulatory Visit

## 2023-11-06 VITALS — BP 120/89 | HR 91

## 2023-11-06 VITALS — BP 124/78 | HR 88 | Temp 97.7°F | Resp 18 | Ht 65.0 in | Wt 204.0 lb

## 2023-11-06 DIAGNOSIS — E538 Deficiency of other specified B group vitamins: Secondary | ICD-10-CM | POA: Insufficient documentation

## 2023-11-06 DIAGNOSIS — D509 Iron deficiency anemia, unspecified: Secondary | ICD-10-CM | POA: Insufficient documentation

## 2023-11-06 DIAGNOSIS — C7A019 Malignant carcinoid tumor of the small intestine, unspecified portion: Secondary | ICD-10-CM | POA: Diagnosis present

## 2023-11-06 DIAGNOSIS — D500 Iron deficiency anemia secondary to blood loss (chronic): Secondary | ICD-10-CM

## 2023-11-06 DIAGNOSIS — E876 Hypokalemia: Secondary | ICD-10-CM | POA: Insufficient documentation

## 2023-11-06 DIAGNOSIS — Z7901 Long term (current) use of anticoagulants: Secondary | ICD-10-CM | POA: Diagnosis not present

## 2023-11-06 DIAGNOSIS — C7B02 Secondary carcinoid tumors of liver: Secondary | ICD-10-CM

## 2023-11-06 DIAGNOSIS — K6389 Other specified diseases of intestine: Secondary | ICD-10-CM

## 2023-11-06 DIAGNOSIS — I4891 Unspecified atrial fibrillation: Secondary | ICD-10-CM | POA: Diagnosis not present

## 2023-11-06 DIAGNOSIS — Z853 Personal history of malignant neoplasm of breast: Secondary | ICD-10-CM | POA: Diagnosis not present

## 2023-11-06 DIAGNOSIS — Z86718 Personal history of other venous thrombosis and embolism: Secondary | ICD-10-CM | POA: Insufficient documentation

## 2023-11-06 DIAGNOSIS — C7A8 Other malignant neuroendocrine tumors: Secondary | ICD-10-CM

## 2023-11-06 LAB — CBC WITH DIFFERENTIAL (CANCER CENTER ONLY)
Abs Immature Granulocytes: 0.03 K/uL (ref 0.00–0.07)
Basophils Absolute: 0 K/uL (ref 0.0–0.1)
Basophils Relative: 1 %
Eosinophils Absolute: 0.3 K/uL (ref 0.0–0.5)
Eosinophils Relative: 5 %
HCT: 35.2 % — ABNORMAL LOW (ref 36.0–46.0)
Hemoglobin: 11.5 g/dL — ABNORMAL LOW (ref 12.0–15.0)
Immature Granulocytes: 1 %
Lymphocytes Relative: 10 %
Lymphs Abs: 0.6 K/uL — ABNORMAL LOW (ref 0.7–4.0)
MCH: 30.2 pg (ref 26.0–34.0)
MCHC: 32.7 g/dL (ref 30.0–36.0)
MCV: 92.4 fL (ref 80.0–100.0)
Monocytes Absolute: 0.8 K/uL (ref 0.1–1.0)
Monocytes Relative: 15 %
Neutro Abs: 3.9 K/uL (ref 1.7–7.7)
Neutrophils Relative %: 68 %
Platelet Count: 180 K/uL (ref 150–400)
RBC: 3.81 MIL/uL — ABNORMAL LOW (ref 3.87–5.11)
RDW: 15.9 % — ABNORMAL HIGH (ref 11.5–15.5)
WBC Count: 5.7 K/uL (ref 4.0–10.5)
nRBC: 0 % (ref 0.0–0.2)

## 2023-11-06 LAB — CMP (CANCER CENTER ONLY)
ALT: 10 U/L (ref 0–44)
AST: 20 U/L (ref 15–41)
Albumin: 3.6 g/dL (ref 3.5–5.0)
Alkaline Phosphatase: 77 U/L (ref 38–126)
Anion gap: 10 (ref 5–15)
BUN: 12 mg/dL (ref 8–23)
CO2: 24 mmol/L (ref 22–32)
Calcium: 9.1 mg/dL (ref 8.9–10.3)
Chloride: 100 mmol/L (ref 98–111)
Creatinine: 0.79 mg/dL (ref 0.44–1.00)
GFR, Estimated: >60 mL/min (ref >60)
Glucose, Bld: 122 mg/dL — ABNORMAL HIGH (ref 70–99)
Potassium: 3.2 mmol/L — ABNORMAL LOW (ref 3.5–5.1)
Sodium: 134 mmol/L — ABNORMAL LOW (ref 135–145)
Total Bilirubin: 0.9 mg/dL (ref 0.0–1.2)
Total Protein: 6.4 g/dL — ABNORMAL LOW (ref 6.5–8.1)

## 2023-11-06 MED ORDER — CYANOCOBALAMIN 1000 MCG/ML IJ SOLN
1000.0000 ug | Freq: Once | INTRAMUSCULAR | Status: AC
  Administered 2023-11-06: 1000 ug via INTRAMUSCULAR
  Filled 2023-11-06: qty 1

## 2023-11-06 MED ORDER — OCTREOTIDE ACETATE 30 MG IM KIT
30.0000 mg | PACK | Freq: Once | INTRAMUSCULAR | Status: AC
  Administered 2023-11-06: 30 mg via INTRAMUSCULAR
  Filled 2023-11-06: qty 1

## 2023-11-06 MED ORDER — IRON SUCROSE 20 MG/ML IV SOLN
200.0000 mg | Freq: Once | INTRAVENOUS | Status: AC
  Administered 2023-11-06: 200 mg via INTRAVENOUS
  Filled 2023-11-06: qty 10

## 2023-11-06 NOTE — Progress Notes (Signed)
 C/o having nose bleeds x1 week and headaches at times.

## 2023-11-06 NOTE — Progress Notes (Signed)
 [facility] OFFICE PROGRESS NOTE  Patient Care Team: Stanton Lynwood FALCON, [name] Donette Ellouise LABOR, [name] Ammon Blunt, [name] Epifanio Alm SQUIBB, [name] Rennie Cindy SAUNDERS, [name]   Cancer Staging  No matching staging information was found for the patient.  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   # FEB 2018- Ga PET- uptake in liver/ mesenteric mass; NOV 2019- increase sandostatin  to 30mg  q monthly.   #S/p Lutathera  x4 every 2 months; last treatment mid September 2022 [Dr.edmunds]; OCT 2022- PET STABLE.   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 In a wheelchair.  Alone.   Emma [name] 83 y.o. female with above history of metastatic carcinoid tumor, currently on Sandostatin .  She has status post Lutathera  infusions every 2 months.  Also has a history of iron  deficiency anemia and question AV malformation, A-fib, on Eliquis .  She returns to clinic today for follow-up and consideration of Sandostatin . She denies any black or bloody stools.  No diarrhea.  Continues Eliquis .  Her appetite is good.  Denies any recent flushing.  No pain.  Fatigue is stable.  Continues to use a walker at home but wheelchair for longer distances.  Review of Systems  Constitutional:  Positive for malaise/fatigue. Negative for chills, diaphoresis, fever and weight loss.  HENT:  Negative for nosebleeds and sore throat.   Eyes:  Negative for double vision.  Respiratory:  Negative for hemoptysis, sputum production, shortness of breath and wheezing.   Cardiovascular:  Negative for chest pain, palpitations and orthopnea.  Gastrointestinal:  Positive for abdominal pain and diarrhea. Negative for constipation, heartburn, melena, nausea and vomiting.  Genitourinary:  Negative for dysuria, frequency and urgency.  Musculoskeletal:  Positive for back pain. Negative for joint pain.  Skin: Negative.  Negative for itching and rash.  Neurological:  Negative for dizziness, tingling, focal weakness, weakness and headaches.  Endo/Heme/Allergies:  Does not bruise/bleed easily.  Psychiatric/Behavioral:  Negative for depression. The patient is not nervous/anxious and does not have insomnia.     PAST MEDICAL HISTORY :  Past Medical History:  Diagnosis Date   Aromatase inhibitor use    Atrial  fibrillation (HCC)    Breast cancer (HCC) 2014   left breast cancer/radiation   Breast cancer, left breast (HCC) 06/19/2014   CHF (congestive heart failure) (HCC)    recent  sepsis   Chronic kidney disease    Dizziness    Dyspnea    recently while admitted   GERD (gastroesophageal reflux disease)    Hearing loss    History of hiatal hernia    History of kidney stones    HOH (hard of hearing)    severe   Hypertension    Hypothyroidism    Leg DVT (deep venous thromboembolism), acute (HCC) 07/2015   left leg after fracture   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   Pain    chest pain while admitted   Personal history of radiation therapy 2015   left breast ca   Thyroid  disease     PAST SURGICAL HISTORY :   Past Surgical History:  Procedure Laterality Date   ABDOMINAL HYSTERECTOMY     partial   BREAST LUMPECTOMY Left 01/13/2013   invasive mammary carcinoma, clear margins, LN negative   BREAST LUMPECTOMY WITH SENTINEL LYMPH NODE BIOPSY Left 2014   CARDIOVERSION N/A 07/16/2023   Procedure: CARDIOVERSION;  Surgeon: Ammon Blunt, MD;  Location: [facility] ORS;  Service: Cardiovascular;  Laterality: N/A;   CHOLECYSTECTOMY     COLONOSCOPY WITH PROPOFOL  N/A 09/17/2017   Procedure: COLONOSCOPY WITH PROPOFOL ;  Surgeon: Therisa Bi, MD;  Location: [facility] END[facility]COPY;  Service: Gastroenterology;  Laterality: N/A;   CYSTOSCOPY W/ URETERAL STENT PLACEMENT Right 01/07/2016   Procedure: CYSTOSCOPY WITH STENT REPLACEMENT;  Surgeon: Rosina Riis, MD;  Location: [facility] ORS;  Service: Urology;  Laterality: Right;   CYSTOSCOPY WITH RETROGRADE PYELOGRAM, URETEROSCOPY AND STENT PLACEMENT Right 12/19/2015   Procedure: CYSTOSCOPY WITH RETROGRADE PYELOGRAM, URETEROSCOPY AND STENT PLACEMENT;  Surgeon: Morene LELON Salines, MD;  Location: [facility] ORS;  Service: Urology;  Laterality: Right;   URETEROSCOPY WITH HOLMIUM LASER LITHOTRIPSY Right 01/07/2016   Procedure: URETEROSCOPY WITH HOLMIUM LASER LITHOTRIPSY;  Surgeon: Rosina Riis, MD;  Location: [facility] ORS;  Service: Urology;  Laterality: Right;    FAMILY HISTORY :   Family History  Problem Relation Age of Onset   Breast cancer Mother 33        deceased 93   Breast cancer Maternal Aunt 44       deceased 6s   Prostate cancer Father        deceased 55   Colon cancer Paternal Aunt 38       deceased 60s    SOCIAL HISTORY:   Social History   Tobacco Use   Smoking status: Never   Smokeless tobacco: Never  Vaping Use   Vaping status: Never Used  Substance Use Topics   Alcohol use: No    Alcohol/week: 0.0 standard drinks of alcohol   Drug use: No    ALLERGIES:  is allergic to sulfa antibiotics.  MEDICATIONS:  Current Outpatient Medications  Medication Sig Dispense Refill   apixaban  (ELIQUIS ) 5 MG TABS tablet Take 1 tablet (5 mg total) by mouth 2 (two) times daily. 60 tablet 0   diltiazem  (CARDIZEM  CD) 180 MG 24 hr capsule Take 180 mg by mouth daily.     diltiazem  (CARDIZEM ) 30 MG tablet Take 30 mg by mouth.     diltiazem  (CARDIZEM ) 30 MG tablet Take 30 mg by mouth daily as needed.     furosemide  (LASIX ) 40 MG tablet Take 40 mg by mouth daily as needed for edema.  labetalol  (NORMODYNE ) 100 MG tablet Take 100 mg by mouth 2 (two) times daily.     levothyroxine  (SYNTHROID ) 88 MCG tablet Take 88 mcg by mouth daily before breakfast.      losartan -hydrochlorothiazide  (HYZAAR) 100-25 MG tablet Take 1 tablet by mouth daily.     nystatin  (MYCOSTATIN /NYSTOP ) powder APPLY 1 APPLICATION TOPICALLY 3 (THREE) TIMES DAILY. USE POWDER UNDER BREASTS WERE THE RASH THREE TIMES A DAY TIL GOES AWAY 60 g 1   potassium chloride  SA (KLOR-CON  M) 20 MEQ tablet TAKE 1 TABLET BY MOUTH TWICE A DAY 180 tablet 1   traMADol  (ULTRAM ) 50 MG tablet Take 1 tablet (50 mg total) by mouth every 8 (eight) hours as needed. 30 tablet 0   Vitamin D , Ergocalciferol , (DRISDOL ) 1.25 MG (50000 UNIT) CAPS capsule TAKE 1 CAPSULE BY MOUTH ONE TIME PER WEEK 12 capsule 3   No current facility-administered medications for this visit.   Facility-Administered Medications Ordered in Other Visits  Medication Dose Route Frequency Provider Last Rate Last Admin    octreotide  (SANDOSTATIN  LAR) 30 MG IM injection             PHYSICAL EXAMINATION: ECOG PERFORMANCE STATUS: 1 - Symptomatic but completely ambulatory  BP 124/78 (BP Location: Right Arm, Patient Position: Sitting, Cuff Size: Large)   Pulse 88   Temp 97.7 F (36.5 C) (Oral)   Resp 18   Ht 5' 5 (1.651 m)   Wt 204 lb (92.5 kg)   SpO2 95%   BMI 33.95 kg/m   Filed Weights   11/06/23 1249  Weight: 204 lb (92.5 kg)   Physical Exam HENT:     Head: Normocephalic and atraumatic.     Mouth/Throat:     Pharynx: No oropharyngeal exudate.  Eyes:     Pupils: Pupils are equal, round, and reactive to light.  Cardiovascular:     Rate and Rhythm: Normal rate. Rhythm irregular.  Pulmonary:     Effort: Pulmonary effort is normal. No respiratory distress.     Breath sounds: Normal breath sounds. No wheezing.  Abdominal:     General: Bowel sounds are normal. There is no distension.     Palpations: Abdomen is soft. There is no mass.     Tenderness: There is no abdominal tenderness. There is no guarding or rebound.  Musculoskeletal:        General: Swelling present. No tenderness. Normal range of motion.     Cervical back: Normal range of motion and neck supple.  Skin:    General: Skin is warm.  Neurological:     Mental Status: She is alert and oriented to person, place, and time.  Psychiatric:        Mood and Affect: Affect normal.    LABORATORY DATA:  I have reviewed the data as listed    Component Value Date/Time   NA 134 (L) 11/06/2023 1244   NA 137 07/12/2013 0841   K 3.2 (L) 11/06/2023 1244   K 4.7 07/12/2013 0841   CL 100 11/06/2023 1244   CL 107 07/12/2013 0841   CO2 24 11/06/2023 1244   CO2 20 (L) 07/12/2013 0841   GLUCOSE 122 (H) 11/06/2023 1244   GLUCOSE 122 (H) 07/12/2013 0841   BUN 12 11/06/2023 1244   BUN 18 07/12/2013 0841   CREATININE 0.79 11/06/2023 1244   CREATININE 0.64 05/22/2014 1111   CALCIUM 9.1 11/06/2023 1244   CALCIUM 9.2 07/12/2013 0841   PROT 6.4  (L) 11/06/2023 1244   PROT 7.0  05/22/2014 1111   ALBUMIN 3.6 11/06/2023 1244   ALBUMIN 4.2 05/22/2014 1111   AST 20 11/06/2023 1244   ALT 10 11/06/2023 1244   ALT 17 05/22/2014 1111   ALKPHOS 77 11/06/2023 1244   ALKPHOS 95 05/22/2014 1111   BILITOT 0.9 11/06/2023 1244   GFRNONAA >60 11/06/2023 1244   GFRNONAA >60 05/22/2014 1111   GFRAA >60 10/07/2019 1319   GFRAA >60 05/22/2014 1111   Lab Results  Component Value Date   WBC 5.7 11/06/2023   NEUTROABS 3.9 11/06/2023   HGB 11.5 (L) 11/06/2023   HCT 35.2 (L) 11/06/2023   MCV 92.4 11/06/2023   PLT 180 11/06/2023     Chemistry      Component Value Date/Time   NA 136 09/07/2023 1300   NA 137 07/12/2013 0841   K 3.1 (L) 09/07/2023 1300   K 4.7 07/12/2013 0841   CL 105 09/07/2023 1300   CL 107 07/12/2013 0841   CO2 22 09/07/2023 1300   CO2 20 (L) 07/12/2013 0841   BUN 16 09/07/2023 1300   BUN 18 07/12/2013 0841   CREATININE 0.90 09/07/2023 1300   CREATININE 0.64 05/22/2014 1111      Component Value Date/Time   CALCIUM 9.2 09/07/2023 1300   CALCIUM 9.2 07/12/2013 0841   ALKPHOS 84 09/07/2023 1300   ALKPHOS 95 05/22/2014 1111   AST 20 09/07/2023 1300   ALT 11 09/07/2023 1300   ALT 17 05/22/2014 1111   BILITOT 0.6 09/07/2023 1300     Lab Results  Component Value Date   VITAMINB12 227 09/07/2023   Component Ref Range & Units (hover) 2 mo ago 4 mo ago 6 mo ago 8 mo ago 9 mo ago 10 mo ago 1 yr ago  Chromogranin A (ng/mL) 162.0 High  122.0 High  CM 113.7 High  CM 115.6 High  CM 121.8 High  CM 97.0 CM 110.4 High     RADIOGRAPHIC STUDIES: I have personally reviewed the radiological images as listed and agreed with the findings in the report. No results found.   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  Dotatate PET scan 11/07/2020.   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  Focus of intense radiotracer activity within the adjacent small bowel consistent with neuroendocrine tumor of the small bowel.  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  Echo March 2023 within normal limits.  No right-sided abnormalities.  Her most recent imaging was in January 2025-no interval change from comparison.    # Symptomatically she is stable without any obvious clinical signs or symptoms of progressive disease.  Her chromogranin a in August 2025 was 162 which is increased over the past year.  Today's result is pending.  Labs today reviewed and acceptable for continuation of treatment. Continue Sandostatin  monthly.  Plan for dotatate pet in January 2026 or sooner if symptomatic.   # Iron  deficiency anemia-?  AV malformation/small bowel carcinoid and use of Eliquis .  April 2025 iron  sat 5%, ferritin 11.  She has previously declined GI workup in the past.  Hemoglobin today is 11.5 which is improved but overall stable.  She last received IV iron  in September 2025.  Proceed with Venofer  today.  # B12  deficiency-stop oral B12 supplementation due to lack of effect.  She started B12 injections.  Tolerating well.  Proceed with monthly B12 injection.   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  Not on Xermelo  secondary to insurance issues.  # Hypokalemia-potassium 3.1 which is stable.  Secondary to diuretics.  Encouraged ongoing compliance with daily potassium supplementation.   # Afib- March 2023. On eliquis  per Dr Parshoes/cardiology. Monitor closely given history of rectal bleeding. S/p cardioversion in 07/16/23, however failed to maintain NSR.     # DISPOSITION- # venofer  today;  B12 injection; sandostatin  # 1 month- b12 injection, Sandostatin ,  venofer  # 2 mo- labs (cbc, cmp, ferritin, iron  studies, b12, Chromogranin), Dr Rennie, +/- Sandostatin , b12, venofer - la  No problem-specific Assessment & Plan notes found for this encounter.  No orders of the defined types were placed in this encounter.  All questions were answered. The patient knows to call the clinic with any problems, questions or concerns.    Tinnie KANDICE Dawn, NP 11/06/2023

## 2023-11-09 LAB — CHROMOGRANIN A: Chromogranin A (ng/mL): 139.9 ng/mL — ABNORMAL HIGH (ref 0.0–101.8)

## 2023-11-27 ENCOUNTER — Encounter: Payer: Self-pay | Admitting: Internal Medicine

## 2023-12-07 ENCOUNTER — Inpatient Hospital Stay: Attending: Internal Medicine

## 2023-12-07 ENCOUNTER — Telehealth: Payer: Self-pay

## 2023-12-07 VITALS — BP 122/79 | HR 98 | Temp 97.3°F | Resp 18

## 2023-12-07 DIAGNOSIS — C7B02 Secondary carcinoid tumors of liver: Secondary | ICD-10-CM | POA: Diagnosis present

## 2023-12-07 DIAGNOSIS — Z86718 Personal history of other venous thrombosis and embolism: Secondary | ICD-10-CM | POA: Insufficient documentation

## 2023-12-07 DIAGNOSIS — D509 Iron deficiency anemia, unspecified: Secondary | ICD-10-CM | POA: Diagnosis not present

## 2023-12-07 DIAGNOSIS — Z7901 Long term (current) use of anticoagulants: Secondary | ICD-10-CM | POA: Diagnosis not present

## 2023-12-07 DIAGNOSIS — C7A019 Malignant carcinoid tumor of the small intestine, unspecified portion: Secondary | ICD-10-CM | POA: Diagnosis present

## 2023-12-07 DIAGNOSIS — K6389 Other specified diseases of intestine: Secondary | ICD-10-CM

## 2023-12-07 DIAGNOSIS — I4891 Unspecified atrial fibrillation: Secondary | ICD-10-CM | POA: Insufficient documentation

## 2023-12-07 DIAGNOSIS — Z853 Personal history of malignant neoplasm of breast: Secondary | ICD-10-CM | POA: Diagnosis not present

## 2023-12-07 DIAGNOSIS — C7A8 Other malignant neuroendocrine tumors: Secondary | ICD-10-CM

## 2023-12-07 DIAGNOSIS — E3400 Carcinoid syndrome, unspecified: Secondary | ICD-10-CM | POA: Diagnosis not present

## 2023-12-07 MED ORDER — OCTREOTIDE ACETATE 30 MG IM KIT
30.0000 mg | PACK | Freq: Once | INTRAMUSCULAR | Status: AC
  Administered 2023-12-07: 30 mg via INTRAMUSCULAR
  Filled 2023-12-07: qty 1

## 2023-12-07 MED ORDER — IRON SUCROSE 20 MG/ML IV SOLN
200.0000 mg | Freq: Once | INTRAVENOUS | Status: AC
  Administered 2023-12-07: 200 mg via INTRAVENOUS
  Filled 2023-12-07: qty 10

## 2023-12-07 MED ORDER — CYANOCOBALAMIN 1000 MCG/ML IJ SOLN
1000.0000 ug | Freq: Once | INTRAMUSCULAR | Status: AC
  Administered 2023-12-07: 1000 ug via INTRAMUSCULAR
  Filled 2023-12-07: qty 1

## 2023-12-07 NOTE — Progress Notes (Signed)
 Patient presented for Venofer  injection.  RN obtained IV access with positive blood return.  Upon completion of injection, IV infiltrated.  Patient c/o of minimal pain to the site, RN removed IV and applied ice to the site.  Ecchymosis noted at the site of infiltration.  By the time the patient had left, infiltrate had already begun to absorb.  Patient told to call [facility] if further symptoms did not resolve.

## 2023-12-07 NOTE — Telephone Encounter (Signed)
 Spoke to patient's daughter, Ellouise, relayed the options for Emma [name].  Ellouise says they have not been applying ice so will try that route first and will call the office tomorrow if they feel visit is needed.

## 2023-12-07 NOTE — Telephone Encounter (Signed)
 When patient received IV iron  today she was told by RN that it went under the skin.  Now has a huge bruise and a hard lump on her arm that she is almost in tears.    Patient daughter request a call back immediately.  Will forward to [facility] and communicate with that team for a quick response.

## 2023-12-15 ENCOUNTER — Telehealth: Payer: Self-pay

## 2023-12-15 NOTE — Telephone Encounter (Signed)
 Ellouise wants patient to be seen.  Patient's daughter calling with concerns of continued pain, swelling, discoloration at IV site from iron  infusion on 12/07/2023.  Ellouise says patient has been honest about the continued symptoms until today.   Ellouise wants patient to be seen

## 2023-12-16 ENCOUNTER — Inpatient Hospital Stay: Admitting: Nurse Practitioner

## 2023-12-16 VITALS — BP 101/41 | HR 61 | Resp 18 | Ht 65.0 in | Wt 204.0 lb

## 2023-12-16 DIAGNOSIS — C7A019 Malignant carcinoid tumor of the small intestine, unspecified portion: Secondary | ICD-10-CM | POA: Diagnosis not present

## 2023-12-16 DIAGNOSIS — T801XXA Vascular complications following infusion, transfusion and therapeutic injection, initial encounter: Secondary | ICD-10-CM | POA: Diagnosis not present

## 2023-12-16 NOTE — Progress Notes (Signed)
 [facility] OFFICE PROGRESS NOTE  Patient Care Team: Valora Lynwood FALCON, [name] Donette Ellouise LABOR, [name] Ammon Blunt, [name] Epifanio Alm SQUIBB, [name] Rennie Cindy SAUNDERS, [name]   Cancer Staging  No matching staging information was found for the patient.  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   # FEB 2018- Ga PET- uptake in liver/ mesenteric mass; NOV 2019- increase sandostatin  to 30mg  q monthly.   #S/p Lutathera  x4 every 2 months; last treatment mid September 2022 [Dr.edmunds]; OCT 2022- PET STABLE.   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 In a wheelchair.  Alone.   Emma [name] 83 y.o. [name] with above history of metastatic carcinoid tumor, currently on Sandostatin .  She has status post Lutathera  infusions every 2 months.  Also has a history of iron  deficiency anemia and question AV malformation, A-fib, on Eliquis .  She returns to clinic today for symptom management due to continued pain at IV site where she received venofer  last week on the 10th.  She denies any fever or chills.  No changes in range of motion.  Good sensation and pulses as well as good cap refill in right arm today.  She reports that it is starting to feel better and the swelling is starting to go down.  However, it continues to ache.  She reports that she tried to put ice on it however it was too cold to keep it on there.  Instructed her on applying ice for 15 minutes at a time and she can use a towel in between skin and ice pack as a barrier.  There is no warmth or redness noted around site today.  No signs and symptoms of cellulitis or phlebitis.  In the case of an extravasation Venofer  is not classified as a vesicant.  However it can cause discoloration of the skin, swelling and tenderness.  I advised her to take Tylenol  continue with ice packs and supportive measures.  Instructed her to call the clinic if pain gets worse, any fever or chills, any increased swelling tenderness, warmth or redness around site.  She verbalizes understanding. She denies any black or  bloody stools.  No diarrhea.  Continues Eliquis .  Her appetite is good.  Denies any recent flushing.  No pain.  Fatigue is stable.  Continues to use a walker at home but wheelchair for longer distances.           Review of Systems  Constitutional:  Positive for malaise/fatigue. Negative for chills, diaphoresis, fever and weight loss.  HENT:  Negative for nosebleeds and sore throat.   Eyes:   Negative for double vision.  Respiratory:  Negative for hemoptysis, sputum production, shortness of breath and wheezing.   Cardiovascular:  Negative for chest pain, palpitations and orthopnea.  Gastrointestinal:  Negative for abdominal pain, constipation, diarrhea, heartburn, melena, nausea and vomiting.  Genitourinary:  Negative for dysuria, frequency and urgency.  Musculoskeletal:  Positive for back pain. Negative for joint pain.  Skin: Negative.  Negative for itching and rash.       Mild swelling and discoloration right antecubital from IV iron    Neurological:  Negative for dizziness, tingling, focal weakness, weakness and headaches.  Endo/Heme/Allergies:  Does not bruise/bleed easily.  Psychiatric/Behavioral:  Negative for depression. The patient is not nervous/anxious and does not have insomnia.     PAST MEDICAL HISTORY :  Past Medical History:  Diagnosis Date   Aromatase inhibitor use    Atrial fibrillation (HCC)    Breast cancer (HCC) 2014   left breast cancer/radiation   Breast cancer, left breast (HCC) 06/19/2014   CHF (congestive heart failure) (HCC)    recent sepsis   Chronic kidney disease    Dizziness    Dyspnea    recently while admitted   GERD (gastroesophageal reflux disease)    Hearing loss    History of hiatal hernia    History of kidney stones    HOH (hard of hearing)    severe   Hypertension    Hypothyroidism    Leg DVT (deep venous thromboembolism), acute (HCC) 07/2015   left leg after fracture   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   Pain    chest pain while admitted   Personal history of radiation therapy 2015   left breast ca   Thyroid  disease     PAST SURGICAL HISTORY :   Past Surgical History:  Procedure Laterality Date   ABDOMINAL HYSTERECTOMY     partial   BREAST LUMPECTOMY Left 01/13/2013   invasive mammary carcinoma, clear margins, LN negative   BREAST LUMPECTOMY WITH SENTINEL LYMPH NODE BIOPSY Left 2014   CARDIOVERSION N/A 07/16/2023    Procedure: CARDIOVERSION;  Surgeon: Ammon Blunt, MD;  Location: [facility] ORS;  Service: Cardiovascular;  Laterality: N/A;   CHOLECYSTECTOMY     COLONOSCOPY WITH PROPOFOL  N/A 09/17/2017   Procedure: COLONOSCOPY WITH PROPOFOL ;  Surgeon: Therisa Bi, MD;  Location: [facility] ENDOSCOPY;  Service: Gastroenterology;  Laterality: N/A;   CYSTOSCOPY W/ URETERAL STENT PLACEMENT Right 01/07/2016   Procedure: CYSTOSCOPY WITH STENT REPLACEMENT;  Surgeon: Rosina Riis, MD;  Location: [facility] ORS;  Service: Urology;  Laterality: Right;   CYSTOSCOPY WITH RETROGRADE PYELOGRAM, URETEROSCOPY AND STENT PLACEMENT Right 12/19/2015   Procedure: CYSTOSCOPY WITH RETROGRADE PYELOGRAM, URETEROSCOPY AND STENT PLACEMENT;  Surgeon: Morene LELON Salines, MD;  Location: [facility] ORS;  Service: Urology;  Laterality: Right;   URETEROSCOPY WITH HOLMIUM LASER LITHOTRIPSY Right 01/07/2016   Procedure: URETEROSCOPY WITH HOLMIUM LASER LITHOTRIPSY;  Surgeon: Rosina Riis, MD;  Location: [facility] ORS;  Service: Urology;  Laterality: Right;    FAMILY HISTORY :   Family History  Problem Relation Age of Onset  Breast cancer Mother 12       deceased 61   Breast cancer Maternal Aunt 58       deceased 32s   Prostate cancer Father        deceased 35   Colon cancer Paternal Aunt 71       deceased 58s    SOCIAL HISTORY:   Social History   Tobacco Use   Smoking status: Never   Smokeless tobacco: Never  Vaping Use   Vaping status: Never Used  Substance Use Topics   Alcohol use: No    Alcohol/week: 0.0 standard drinks of alcohol   Drug use: No    ALLERGIES:  is allergic to sulfa antibiotics.  MEDICATIONS:  Current Outpatient Medications  Medication Sig Dispense Refill   apixaban  (ELIQUIS ) 5 MG TABS tablet Take 1 tablet (5 mg total) by mouth 2 (two) times daily. 60 tablet 0   diltiazem  (CARDIZEM  CD) 180 MG 24 hr capsule Take 180 mg by mouth daily.     diltiazem  (CARDIZEM ) 30 MG tablet Take 30 mg by mouth.     diltiazem  (CARDIZEM )  30 MG tablet Take 30 mg by mouth daily as needed.     furosemide  (LASIX ) 40 MG tablet Take 40 mg by mouth daily as needed for edema.     labetalol  (NORMODYNE ) 100 MG tablet Take 100 mg by mouth 2 (two) times daily.     levothyroxine  (SYNTHROID ) 88 MCG tablet Take 88 mcg by mouth daily before breakfast.      losartan -hydrochlorothiazide  (HYZAAR) 100-25 MG tablet Take 1 tablet by mouth daily.     nystatin  (MYCOSTATIN /NYSTOP ) powder APPLY 1 APPLICATION TOPICALLY 3 (THREE) TIMES DAILY. USE POWDER UNDER BREASTS WERE THE RASH THREE TIMES A DAY TIL GOES AWAY 60 g 1   potassium chloride  SA (KLOR-CON  M) 20 MEQ tablet TAKE 1 TABLET BY MOUTH TWICE A DAY 180 tablet 1   traMADol  (ULTRAM ) 50 MG tablet Take 1 tablet (50 mg total) by mouth every 8 (eight) hours as needed. 30 tablet 0   Vitamin D , Ergocalciferol , (DRISDOL ) 1.25 MG (50000 UNIT) CAPS capsule TAKE 1 CAPSULE BY MOUTH ONE TIME PER WEEK 12 capsule 3   No current facility-administered medications for this visit.   Facility-Administered Medications Ordered in Other Visits  Medication Dose Route Frequency Provider Last Rate Last Admin   octreotide  (SANDOSTATIN  LAR) 30 MG IM injection             PHYSICAL EXAMINATION: ECOG PERFORMANCE STATUS: 1 - Symptomatic but completely ambulatory  BP (!) 101/41 (BP Location: Right Wrist, Patient Position: Sitting)   Pulse 61   Resp 18   Ht 5' 5 (1.651 m)   Wt 204 lb (92.5 kg)   BMI 33.95 kg/m   Filed Weights   12/16/23 1301  Weight: 204 lb (92.5 kg)   Physical Exam HENT:     Head: Normocephalic and atraumatic.     Mouth/Throat:     Pharynx: No oropharyngeal exudate.  Eyes:     Pupils: Pupils are equal, round, and reactive to light.  Cardiovascular:     Rate and Rhythm: Normal rate. Rhythm irregular.  Pulmonary:     Effort: Pulmonary effort is normal. No respiratory distress.     Breath sounds: Normal breath sounds. No wheezing.  Abdominal:     General: Bowel sounds are normal. There is no  distension.     Palpations: Abdomen is soft. There is no mass.     Tenderness: There is no  abdominal tenderness. There is no guarding or rebound.  Musculoskeletal:        General: Swelling present. No tenderness. Normal range of motion.     Cervical back: Normal range of motion and neck supple.  Skin:    General: Skin is warm.     Comments: Mild tenderness, and discoloration around old iv site on right ac  Neurological:     Mental Status: She is alert and oriented to person, place, and time.  Psychiatric:        Mood and Affect: Affect normal.    LABORATORY DATA:  I have reviewed the data as listed    Component Value Date/Time   NA 134 (L) 11/06/2023 1244   NA 137 07/12/2013 0841   K 3.2 (L) 11/06/2023 1244   K 4.7 07/12/2013 0841   CL 100 11/06/2023 1244   CL 107 07/12/2013 0841   CO2 24 11/06/2023 1244   CO2 20 (L) 07/12/2013 0841   GLUCOSE 122 (H) 11/06/2023 1244   GLUCOSE 122 (H) 07/12/2013 0841   BUN 12 11/06/2023 1244   BUN 18 07/12/2013 0841   CREATININE 0.79 11/06/2023 1244   CREATININE 0.64 05/22/2014 1111   CALCIUM 9.1 11/06/2023 1244   CALCIUM 9.2 07/12/2013 0841   PROT 6.4 (L) 11/06/2023 1244   PROT 7.0 05/22/2014 1111   ALBUMIN 3.6 11/06/2023 1244   ALBUMIN 4.2 05/22/2014 1111   AST 20 11/06/2023 1244   ALT 10 11/06/2023 1244   ALT 17 05/22/2014 1111   ALKPHOS 77 11/06/2023 1244   ALKPHOS 95 05/22/2014 1111   BILITOT 0.9 11/06/2023 1244   GFRNONAA >60 11/06/2023 1244   GFRNONAA >60 05/22/2014 1111   GFRAA >60 10/07/2019 1319   GFRAA >60 05/22/2014 1111   Lab Results  Component Value Date   WBC 5.7 11/06/2023   NEUTROABS 3.9 11/06/2023   HGB 11.5 (L) 11/06/2023   HCT 35.2 (L) 11/06/2023   MCV 92.4 11/06/2023   PLT 180 11/06/2023     Chemistry      Component Value Date/Time   NA 134 (L) 11/06/2023 1244   NA 137 07/12/2013 0841   K 3.2 (L) 11/06/2023 1244   K 4.7 07/12/2013 0841   CL 100 11/06/2023 1244   CL 107 07/12/2013 0841   CO2 24  11/06/2023 1244   CO2 20 (L) 07/12/2013 0841   BUN 12 11/06/2023 1244   BUN 18 07/12/2013 0841   CREATININE 0.79 11/06/2023 1244   CREATININE 0.64 05/22/2014 1111      Component Value Date/Time   CALCIUM 9.1 11/06/2023 1244   CALCIUM 9.2 07/12/2013 0841   ALKPHOS 77 11/06/2023 1244   ALKPHOS 95 05/22/2014 1111   AST 20 11/06/2023 1244   ALT 10 11/06/2023 1244   ALT 17 05/22/2014 1111   BILITOT 0.9 11/06/2023 1244     Lab Results  Component Value Date   VITAMINB12 227 09/07/2023   Component Ref Range & Units (hover) 2 mo ago 4 mo ago 6 mo ago 8 mo ago 9 mo ago 10 mo ago 1 yr ago  Chromogranin A (ng/mL) 162.0 High  122.0 High  CM 113.7 High  CM 115.6 High  CM 121.8 High  CM 97.0 CM 110.4 High     RADIOGRAPHIC STUDIES: I have personally reviewed the radiological images as listed and agreed with the findings in the report. No results found.   ASSESSMENT & PLAN:   Swelling/tenderness at old IV site: Patient reported pain, tenderness, swelling,  discoloration at her IV site where she received IV Venofer  on 11/10.  She reports that swelling and discoloration are slowly improving.  However she continues to have aching pain around that site and sometimes radiates up her arm.  Today there is no significant swelling.  No warmth or redness.  No fever or chills.  No changes in sensation to her arm.  She has good cap refill and good ulnar and radial pulses.  Does not appear to be phlebitis or cellulitis.  I encouraged her to continue to apply ice to the site 15 minutes at a time and to apply a barrier in between her skin and the ice.  Advised her that she could also take Tylenol  for discomfort.  Instructed her to call clinic back if no improvement as well as any new or worsening symptoms such as fever or chills, increased swelling, increased redness, warmth changes in sensation or increased tenderness.  In the case of an extravasation of Venofer  it is not considered a vesicant but it does cause  some discoloration, pain, swelling at the site in that case supportive measures are what is recommended.  No other need for intervention at this time.    All questions were answered. The patient knows to call the clinic with any problems, questions or concerns.  Follow-up plan: F/u prn and as scheduled lp   Emma Husband, NP 12/16/2023

## 2023-12-21 ENCOUNTER — Encounter: Payer: Self-pay | Admitting: Internal Medicine

## 2024-01-07 ENCOUNTER — Telehealth: Payer: Self-pay | Admitting: Internal Medicine

## 2024-01-07 NOTE — Telephone Encounter (Signed)
 Called to remind patient of appointment tomorrow, patients daughter, Ellouise, asked me to let you know that Emma [name] has been having a lot of nose bleeds- like daily and multiple. She was unsure if her mother mentioned it to you and wanted to see if you were the appropriate person to inform.  Thank you

## 2024-01-08 ENCOUNTER — Inpatient Hospital Stay: Attending: Internal Medicine

## 2024-01-08 ENCOUNTER — Ambulatory Visit

## 2024-01-08 ENCOUNTER — Other Ambulatory Visit: Payer: Self-pay | Admitting: *Deleted

## 2024-01-08 ENCOUNTER — Encounter: Payer: Self-pay | Admitting: Internal Medicine

## 2024-01-08 ENCOUNTER — Inpatient Hospital Stay: Admitting: Internal Medicine

## 2024-01-08 VITALS — BP 118/72 | HR 80 | Temp 97.8°F | Resp 18 | Ht 65.0 in | Wt 202.6 lb

## 2024-01-08 DIAGNOSIS — D509 Iron deficiency anemia, unspecified: Secondary | ICD-10-CM | POA: Diagnosis not present

## 2024-01-08 DIAGNOSIS — R040 Epistaxis: Secondary | ICD-10-CM | POA: Insufficient documentation

## 2024-01-08 DIAGNOSIS — E538 Deficiency of other specified B group vitamins: Secondary | ICD-10-CM | POA: Diagnosis not present

## 2024-01-08 DIAGNOSIS — E3400 Carcinoid syndrome, unspecified: Secondary | ICD-10-CM | POA: Diagnosis not present

## 2024-01-08 DIAGNOSIS — E876 Hypokalemia: Secondary | ICD-10-CM | POA: Diagnosis not present

## 2024-01-08 DIAGNOSIS — I4891 Unspecified atrial fibrillation: Secondary | ICD-10-CM | POA: Diagnosis not present

## 2024-01-08 DIAGNOSIS — C7B02 Secondary carcinoid tumors of liver: Secondary | ICD-10-CM

## 2024-01-08 DIAGNOSIS — C7A019 Malignant carcinoid tumor of the small intestine, unspecified portion: Secondary | ICD-10-CM | POA: Diagnosis present

## 2024-01-08 DIAGNOSIS — D500 Iron deficiency anemia secondary to blood loss (chronic): Secondary | ICD-10-CM

## 2024-01-08 DIAGNOSIS — K6389 Other specified diseases of intestine: Secondary | ICD-10-CM

## 2024-01-08 DIAGNOSIS — Z7901 Long term (current) use of anticoagulants: Secondary | ICD-10-CM | POA: Insufficient documentation

## 2024-01-08 DIAGNOSIS — C7A8 Other malignant neuroendocrine tumors: Secondary | ICD-10-CM

## 2024-01-08 LAB — CBC WITH DIFFERENTIAL (CANCER CENTER ONLY)
Abs Immature Granulocytes: 0.02 K/uL (ref 0.00–0.07)
Basophils Absolute: 0.1 K/uL (ref 0.0–0.1)
Basophils Relative: 1 %
Eosinophils Absolute: 0.3 K/uL (ref 0.0–0.5)
Eosinophils Relative: 5 %
HCT: 33.1 % — ABNORMAL LOW (ref 36.0–46.0)
Hemoglobin: 10.7 g/dL — ABNORMAL LOW (ref 12.0–15.0)
Immature Granulocytes: 0 %
Lymphocytes Relative: 11 %
Lymphs Abs: 0.6 K/uL — ABNORMAL LOW (ref 0.7–4.0)
MCH: 29.7 pg (ref 26.0–34.0)
MCHC: 32.3 g/dL (ref 30.0–36.0)
MCV: 91.9 fL (ref 80.0–100.0)
Monocytes Absolute: 0.7 K/uL (ref 0.1–1.0)
Monocytes Relative: 13 %
Neutro Abs: 3.6 K/uL (ref 1.7–7.7)
Neutrophils Relative %: 70 %
Platelet Count: 166 K/uL (ref 150–400)
RBC: 3.6 MIL/uL — ABNORMAL LOW (ref 3.87–5.11)
RDW: 14.8 % (ref 11.5–15.5)
WBC Count: 5.2 K/uL (ref 4.0–10.5)
nRBC: 0 % (ref 0.0–0.2)

## 2024-01-08 LAB — CMP (CANCER CENTER ONLY)
ALT: 7 U/L (ref 0–44)
AST: 19 U/L (ref 15–41)
Albumin: 3.9 g/dL (ref 3.5–5.0)
Alkaline Phosphatase: 100 U/L (ref 38–126)
Anion gap: 13 (ref 5–15)
BUN: 13 mg/dL (ref 8–23)
CO2: 21 mmol/L — ABNORMAL LOW (ref 22–32)
Calcium: 9.9 mg/dL (ref 8.9–10.3)
Chloride: 105 mmol/L (ref 98–111)
Creatinine: 0.89 mg/dL (ref 0.44–1.00)
GFR, Estimated: >60 mL/min (ref >60)
Glucose, Bld: 128 mg/dL — ABNORMAL HIGH (ref 70–99)
Potassium: 3.9 mmol/L (ref 3.5–5.1)
Sodium: 139 mmol/L (ref 135–145)
Total Bilirubin: 0.7 mg/dL (ref 0.0–1.2)
Total Protein: 6.6 g/dL (ref 6.5–8.1)

## 2024-01-08 LAB — IRON AND TIBC
Iron: 38 ug/dL (ref 28–170)
Saturation Ratios: 9 % — ABNORMAL LOW (ref 10.4–31.8)
TIBC: 434 ug/dL (ref 250–450)
UIBC: 396 ug/dL

## 2024-01-08 LAB — VITAMIN B12: Vitamin B-12: 293 pg/mL (ref 180–914)

## 2024-01-08 LAB — FERRITIN: Ferritin: 38 ng/mL (ref 11–307)

## 2024-01-08 MED ORDER — OCTREOTIDE ACETATE 30 MG IM KIT
30.0000 mg | PACK | Freq: Once | INTRAMUSCULAR | Status: AC
  Administered 2024-01-08: 30 mg via INTRAMUSCULAR
  Filled 2024-01-08: qty 1

## 2024-01-08 MED ORDER — CYANOCOBALAMIN 1000 MCG/ML IJ SOLN
1000.0000 ug | Freq: Once | INTRAMUSCULAR | Status: AC
  Administered 2024-01-08: 1000 ug via INTRAMUSCULAR
  Filled 2024-01-08: qty 1

## 2024-01-08 MED ORDER — IRON SUCROSE 20 MG/ML IV SOLN
200.0000 mg | Freq: Once | INTRAVENOUS | Status: AC
  Administered 2024-01-08: 200 mg via INTRAVENOUS

## 2024-01-08 NOTE — Progress Notes (Signed)
 Spoke with Son in law regarding nose bleeds. Ok to continue vaseline. Instucted to pick up afrin nasal spray or even saline nasal spray. Will make a referral to ENT for nose bleeds on eliquis . Attempted to call daughter.

## 2024-01-08 NOTE — Progress Notes (Signed)
 [facility] OFFICE PROGRESS NOTE  Patient Care Team: Valora Lynwood FALCON, [name] Donette Ellouise LABOR, [name] Ammon Blunt, [name] Epifanio Alm SQUIBB, [name] Rennie Emma SAUNDERS, [name]   Cancer Staging  No matching staging information was found for the patient.    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   # FEB 2018- Ga PET- uptake in liver/ mesenteric mass; NOV 2019- increase sandostatin  to 30mg  q monthly.   #S/p Lutathera  x4 every 2 months; last treatment mid September 2022 [Dr.edmunds]; OCT 2022- PET STABLE.   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  In a wheelchair.  Alone.   Emma [name] 83 y.o.  female pleasant patient above history of metastatic carcinoid tumor currently on Sandostatin /; s/p Lutathera  infusions every 2 months ; iron  deficient anemia question AV malformation; A-fib on Eliquis  is here for follow-up.  Discussed the use of AI scribe software for clinical note transcription with the patient, who gave verbal consent to proceed.  History of Present Illness   Emma [name] is an 83 year old female with metastatic carcinoid tumor to the liver, chronic anemia, and recurrent epistaxis who presents for hematology/oncology follow-up.  She has metastatic carcinoid tumor to the liver and is maintained on Sandostatin . She continues to experience chronic diarrhea, which she attributes to her underlying malignancy and treatment. There is no hematochezia or other evidence of gastrointestinal bleeding.  She has chronic anemia with iron  levels that remain low but have improved with ongoing iron  infusions. She denies symptoms of acute blood loss or decompensation.  She experiences recurrent epistaxis, occurring nearly daily for the past month, typically mild and most often while eating. Episodes resolve spontaneously and are bothersome, particularly in social settings. She is currently taking Eliquis . She has not previously consulted ENT for this issue.     Review of Systems  Constitutional:  Positive for malaise/fatigue. Negative for chills, diaphoresis, fever and weight loss.  HENT:  Negative for nosebleeds and sore throat.   Eyes:  Negative for double vision.  Respiratory:  Negative for hemoptysis, sputum production, shortness of breath and wheezing.   Cardiovascular:  Negative for chest  pain, palpitations and orthopnea.  Gastrointestinal:  Positive for abdominal pain and diarrhea. Negative for constipation,  heartburn, melena, nausea and vomiting.  Genitourinary:  Negative for dysuria, frequency and urgency.  Musculoskeletal:  Positive for back pain. Negative for joint pain.  Skin: Negative.  Negative for itching and rash.  Neurological:  Negative for dizziness, tingling, focal weakness, weakness and headaches.  Endo/Heme/Allergies:  Does not bruise/bleed easily.  Psychiatric/Behavioral:  Negative for depression. The patient is not nervous/anxious and does not have insomnia.       PAST MEDICAL HISTORY :  Past Medical History:  Diagnosis Date   Aromatase inhibitor use    Atrial fibrillation (HCC)    Breast cancer (HCC) 2014   left breast cancer/radiation   Breast cancer, left breast (HCC) 06/19/2014   CHF (congestive heart failure) (HCC)    recent sepsis   Chronic kidney disease    Dizziness    Dyspnea    recently while admitted   GERD (gastroesophageal reflux disease)    Hearing loss    History of hiatal hernia    History of kidney stones    HOH (hard of hearing)    severe   Hypertension    Hypothyroidism    Leg DVT (deep venous thromboembolism), acute (HCC) 07/2015   left leg after fracture   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   Pain    chest pain while admitted   Personal history of radiation therapy 2015   left breast ca   Thyroid  disease     PAST SURGICAL HISTORY :   Past Surgical History:  Procedure Laterality Date   ABDOMINAL HYSTERECTOMY     partial   BREAST LUMPECTOMY Left 01/13/2013   invasive mammary carcinoma, clear margins, LN negative   BREAST LUMPECTOMY WITH SENTINEL LYMPH NODE BIOPSY Left 2014   CARDIOVERSION N/A 07/16/2023   Procedure: CARDIOVERSION;  Surgeon: Ammon Blunt, MD;  Location: [facility] ORS;  Service: Cardiovascular;  Laterality: N/A;   CHOLECYSTECTOMY     COLONOSCOPY WITH PROPOFOL  N/A 09/17/2017   Procedure: COLONOSCOPY WITH PROPOFOL ;  Surgeon: Therisa Bi, MD;  Location: [facility] ENDOSCOPY;  Service: Gastroenterology;  Laterality: N/A;    CYSTOSCOPY W/ URETERAL STENT PLACEMENT Right 01/07/2016   Procedure: CYSTOSCOPY WITH STENT REPLACEMENT;  Surgeon: Rosina Riis, MD;  Location: [facility] ORS;  Service: Urology;  Laterality: Right;   CYSTOSCOPY WITH RETROGRADE PYELOGRAM, URETEROSCOPY AND STENT PLACEMENT Right 12/19/2015   Procedure: CYSTOSCOPY WITH RETROGRADE PYELOGRAM, URETEROSCOPY AND STENT PLACEMENT;  Surgeon: Morene LELON Salines, MD;  Location: [facility] ORS;  Service: Urology;  Laterality: Right;   URETEROSCOPY WITH HOLMIUM LASER LITHOTRIPSY Right 01/07/2016   Procedure: URETEROSCOPY WITH HOLMIUM LASER LITHOTRIPSY;  Surgeon: Rosina Riis, MD;  Location: [facility] ORS;  Service: Urology;  Laterality: Right;    FAMILY HISTORY :   Family History  Problem Relation Age of Onset   Breast cancer Mother 46       deceased 64   Breast cancer Maternal Aunt 10       deceased 29s   Prostate cancer Father        deceased 71   Colon cancer Paternal Aunt 67       deceased 74s    SOCIAL HISTORY:   Social History   Tobacco Use   Smoking status: Never   Smokeless tobacco: Never  Vaping Use   Vaping status: Never Used  Substance Use Topics   Alcohol use: No    Alcohol/week: 0.0 standard drinks of alcohol   Drug use: No  ALLERGIES:  is allergic to sulfa antibiotics.  MEDICATIONS:  Current Outpatient Medications  Medication Sig Dispense Refill   apixaban  (ELIQUIS ) 5 MG TABS tablet Take 1 tablet (5 mg total) by mouth 2 (two) times daily. 60 tablet 0   aspirin  EC 81 MG tablet Take 81 mg by mouth daily. Swallow whole.     Cyanocobalamin  1000 MCG/ML KIT Inject 1,000 mcg as directed every 30 (thirty) days.     diltiazem  (CARDIZEM  CD) 180 MG 24 hr capsule Take 180 mg by mouth daily.     diltiazem  (CARDIZEM ) 30 MG tablet Take 30 mg by mouth.     diltiazem  (CARDIZEM ) 30 MG tablet Take 30 mg by mouth daily as needed.     furosemide  (LASIX ) 40 MG tablet Take 40 mg by mouth daily as needed for edema. (Patient taking differently: Take 40 mg by  mouth daily.)     labetalol  (NORMODYNE ) 100 MG tablet Take 100 mg by mouth 2 (two) times daily.     levothyroxine  (SYNTHROID ) 88 MCG tablet Take 88 mcg by mouth daily before breakfast.      losartan -hydrochlorothiazide  (HYZAAR) 100-25 MG tablet Take 1 tablet by mouth daily.     nystatin  (MYCOSTATIN /NYSTOP ) powder APPLY 1 APPLICATION TOPICALLY 3 (THREE) TIMES DAILY. USE POWDER UNDER BREASTS WERE THE RASH THREE TIMES A DAY TIL GOES AWAY 60 g 1   potassium chloride  SA (KLOR-CON  M) 20 MEQ tablet TAKE 1 TABLET BY MOUTH TWICE A DAY 180 tablet 1   traMADol  (ULTRAM ) 50 MG tablet Take 1 tablet (50 mg total) by mouth every 8 (eight) hours as needed. 30 tablet 0   Vitamin D , Ergocalciferol , (DRISDOL ) 1.25 MG (50000 UNIT) CAPS capsule TAKE 1 CAPSULE BY MOUTH ONE TIME PER WEEK 12 capsule 3   No current facility-administered medications for this visit.   Facility-Administered Medications Ordered in Other Visits  Medication Dose Route Frequency Provider Last Rate Last Admin   octreotide  (SANDOSTATIN  LAR) 30 MG IM injection             PHYSICAL EXAMINATION: ECOG PERFORMANCE STATUS: 1 - Symptomatic but completely ambulatory  BP 118/72 (BP Location: Right Arm, Patient Position: Sitting, Cuff Size: Large)   Pulse 80   Temp 97.8 F (36.6 C) (Oral)   Resp 18   Ht 5' 5 (1.651 m)   Wt 202 lb 9.6 oz (91.9 kg)   SpO2 98%   BMI 33.71 kg/m   Filed Weights   01/08/24 1322  Weight: 202 lb 9.6 oz (91.9 kg)         Physical Exam HENT:     Head: Normocephalic and atraumatic.     Mouth/Throat:     Pharynx: No oropharyngeal exudate.  Eyes:     Pupils: Pupils are equal, round, and reactive to light.  Cardiovascular:     Rate and Rhythm: Normal rate. Rhythm irregular.  Pulmonary:     Effort: Pulmonary effort is normal. No respiratory distress.     Breath sounds: Normal breath sounds. No wheezing.  Abdominal:     General: Bowel sounds are normal. There is no distension.     Palpations: Abdomen is  soft. There is no mass.     Tenderness: There is no abdominal tenderness. There is no guarding or rebound.  Musculoskeletal:        General: Swelling present. No tenderness. Normal range of motion.     Cervical back: Normal range of motion and neck supple.  Skin:    General: Skin is  warm.  Neurological:     Mental Status: She is alert and oriented to person, place, and time.  Psychiatric:        Mood and Affect: Affect normal.     LABORATORY DATA:  I have reviewed the data as listed    Component Value Date/Time   NA 139 01/08/2024 1317   NA 137 07/12/2013 0841   K 3.9 01/08/2024 1317   K 4.7 07/12/2013 0841   CL 105 01/08/2024 1317   CL 107 07/12/2013 0841   CO2 21 (L) 01/08/2024 1317   CO2 20 (L) 07/12/2013 0841   GLUCOSE 128 (H) 01/08/2024 1317   GLUCOSE 122 (H) 07/12/2013 0841   BUN 13 01/08/2024 1317   BUN 18 07/12/2013 0841   CREATININE 0.89 01/08/2024 1317   CREATININE 0.64 05/22/2014 1111   CALCIUM 9.9 01/08/2024 1317   CALCIUM 9.2 07/12/2013 0841   PROT 6.6 01/08/2024 1317   PROT 7.0 05/22/2014 1111   ALBUMIN 3.9 01/08/2024 1317   ALBUMIN 4.2 05/22/2014 1111   AST 19 01/08/2024 1317   ALT 7 01/08/2024 1317   ALT 17 05/22/2014 1111   ALKPHOS 100 01/08/2024 1317   ALKPHOS 95 05/22/2014 1111   BILITOT 0.7 01/08/2024 1317   GFRNONAA >60 01/08/2024 1317   GFRNONAA >60 05/22/2014 1111   GFRAA >60 10/07/2019 1319   GFRAA >60 05/22/2014 1111    No results found for: SPEP, UPEP  Lab Results  Component Value Date   WBC 5.2 01/08/2024   NEUTROABS 3.6 01/08/2024   HGB 10.7 (L) 01/08/2024   HCT 33.1 (L) 01/08/2024   MCV 91.9 01/08/2024   PLT 166 01/08/2024      Chemistry      Component Value Date/Time   NA 139 01/08/2024 1317   NA 137 07/12/2013 0841   K 3.9 01/08/2024 1317   K 4.7 07/12/2013 0841   CL 105 01/08/2024 1317   CL 107 07/12/2013 0841   CO2 21 (L) 01/08/2024 1317   CO2 20 (L) 07/12/2013 0841   BUN 13 01/08/2024 1317   BUN 18  07/12/2013 0841   CREATININE 0.89 01/08/2024 1317   CREATININE 0.64 05/22/2014 1111      Component Value Date/Time   CALCIUM 9.9 01/08/2024 1317   CALCIUM 9.2 07/12/2013 0841   ALKPHOS 100 01/08/2024 1317   ALKPHOS 95 05/22/2014 1111   AST 19 01/08/2024 1317   ALT 7 01/08/2024 1317   ALT 17 05/22/2014 1111   BILITOT 0.7 01/08/2024 1317       RADIOGRAPHIC STUDIES: I have personally reviewed the radiological images as listed and agreed with the findings in the report. No results found.   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 Echo March 2023- WNL; NO right sided abnormalities.   # Continue Sandostatin  q M.  No obvious clinical evidence of progression of disease noticed.   labs today reviewed; see below re: anemia.  Will order scan at next visit-  #Nose bleeds- on eliquis - recommend ENT  evaluation.  Continue Vaseline application and also recommend Afrin topical spray.  Staff to inform patient's daughter.  # Iron  deficient anemia-? AV malformation/small bowel carcinoid [on ELiquis ]    Declined GI appointment in past.  Today hemoglobin is 10.-proceed with Venofer  today.  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   # Hypokalemia-potassium 3.1; secondary to diuretics-recommended compliance with 20 meQ BID--stable.   ## A.fib 2023;  March- on eliquis  [Dr.Parashoes Cardiologist];  Monitor closely given history of rectal bleeding--s/p cardioversion 07/16/2023, however, failed to maintain sinus rhythm-overall stable.  # b12 def- STOP oral B12 supplementation; proceed with B12 injections once monthly.   # DISPOSITION- #-Recommend ENT evaluation-  Nose bleeds- on eliquis  # venofer  today;  B12 injection; sandostatin  #  in 1 month- b12 injection -Sandostatin ; venofer  # Follow up in  2 months-  MD; labs-- B12 levels; cbc/cmp/ chromogranin; b12 injection -Sandostatin ; possible venofer ; PET scan -  Dr.B    Orders Placed This Encounter  Procedures   CBC with Differential (Cancer Center Only)    Standing Status:   Future    Expected Date:   03/11/2024    Expiration Date:   06/09/2024   CMP (Cancer Center only)    Standing Status:   Future    Expected Date:   03/11/2024    Expiration Date:   06/09/2024   Vitamin B12    Standing Status:   Future    Expected Date:   03/11/2024    Expiration Date:   06/09/2024   Chromogranin A    Standing Status:   Future    Expected Date:   03/11/2024    Expiration Date:   06/09/2024   Ambulatory referral to ENT    Referral Priority:   Routine    Referral Type:   Consultation    Referral Reason:   Specialty Services Required    Referred to Provider:   Marsa Charlie CROME, MD    Requested Specialty:   Otolaryngology    Number of Visits Requested:   1    All questions were answered. The patient knows to call the clinic with any problems, questions or concerns.      Emma JONELLE Joe, MD 01/08/2024 3:54 PM

## 2024-01-08 NOTE — Telephone Encounter (Signed)
 Noted message in my progress note to Dr. B at her appt today.

## 2024-01-08 NOTE — Assessment & Plan Note (Addendum)
#   Non- functional Small bowel/mesenteric carcinoid; s/p Lutathera  + sandostatin  x4 infusions.  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 Echo March 2023- WNL; NO right sided abnormalities.   # Continue Sandostatin  q M.  No obvious clinical evidence of progression of disease noticed.   labs today reviewed; see below re: anemia.  Will order scan at next visit-  #Nose bleeds- on eliquis - recommend ENT evaluation.  Continue Vaseline application and also recommend Afrin topical spray.  Staff to inform patient's daughter.  # Iron  deficient anemia-? AV malformation/small bowel carcinoid [on ELiquis ]    Declined GI appointment in past.  Today hemoglobin is 10.-proceed with Venofer  today.  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   # Hypokalemia-potassium 3.1; secondary to diuretics-recommended compliance with 20 meQ BID--stable.   ## A.fib 2023; March- on eliquis  [Dr.Parashoes Cardiologist];  Monitor closely given history of rectal bleeding--s/p cardioversion 07/16/2023, however, failed to maintain sinus rhythm-overall stable.  # b12 def- STOP oral B12 supplementation; proceed with B12 injections once monthly.   # DISPOSITION- #-Recommend ENT evaluation-  Nose bleeds- on eliquis  # venofer  today;  B12 injection; sandostatin  #  in 1 month- b12 injection -Sandostatin ; venofer  # Follow up in  2 months-  MD; labs-- B12 levels; cbc/cmp/ chromogranin; b12 injection -Sandostatin ; possible venofer ; PET scan -  Dr.B

## 2024-01-08 NOTE — Progress Notes (Signed)
 Pt's daughter left message that pt has been having multiple nose bleeds per day, x several weeks. Nose bleeds are lasting longer than just a few minutes. She states she has been tx for skin cancer on the outside of her nose in the past.

## 2024-01-08 NOTE — Patient Instructions (Signed)
 [facility] - A DEPT OF [facility]  Discharge Instructions: Thank you for choosing [facility] to provide your oncology and hematology care.  If you have a lab appointment with the Cancer Center, please go directly to the Cancer Center and check in at the registration area.  Wear comfortable clothing and clothing appropriate for easy access to any Portacath or PICC line.   We strive to give you quality time with your provider. You may need to reschedule your appointment if you arrive late (15 or more minutes).  Arriving late affects you and other patients whose appointments are after yours.  Also, if you miss three or more appointments without notifying the office, you may be dismissed from the clinic at the providers discretion.      For prescription refill requests, have your pharmacy contact our office and allow 72 hours for refills to be completed.    Today you received the following chemotherapy and/or immunotherapy agents B12, Venofer  and Sandostatin .      To help prevent nausea and vomiting after your treatment, we encourage you to take your nausea medication as directed.  BELOW ARE SYMPTOMS THAT SHOULD BE REPORTED IMMEDIATELY: *FEVER GREATER THAN 100.4 F (38 C) OR HIGHER *CHILLS OR SWEATING *NAUSEA AND VOMITING THAT IS NOT CONTROLLED WITH YOUR NAUSEA MEDICATION *UNUSUAL SHORTNESS OF BREATH *UNUSUAL BRUISING OR BLEEDING *URINARY PROBLEMS (pain or burning when urinating, or frequent urination) *BOWEL PROBLEMS (unusual diarrhea, constipation, pain near the anus) TENDERNESS IN MOUTH AND THROAT WITH OR WITHOUT PRESENCE OF ULCERS (sore throat, sores in mouth, or a toothache) UNUSUAL RASH, SWELLING OR PAIN  UNUSUAL VAGINAL DISCHARGE OR ITCHING   Items with * indicate a potential emergency and should be followed up as soon as possible or go to the Emergency Department if any problems should occur.  Please show the CHEMOTHERAPY ALERT CARD  or IMMUNOTHERAPY ALERT CARD at check-in to the Emergency Department and triage nurse.  Should you have questions after your visit or need to cancel or reschedule your appointment, please contact [facility] - A DEPT OF [facility]  [phone] and follow the prompts.  Office hours are 8:00 a.m. to 4:30 p.m. Monday - Friday. Please note that voicemails left after 4:00 p.m. may not be returned until the following business day.  We are closed weekends and major holidays. You have access to a nurse at all times for urgent questions. Please call the main number to the clinic [phone] and follow the prompts.  For any non-urgent questions, you may also contact your provider using MyChart. We now offer e-Visits for anyone 39 and older to request care online for non-urgent symptoms. For details visit [email or web address].   Also download the MyChart app! Go to the app store, search MyChart, open the app, select [place], and log in with your MyChart username and password.

## 2024-01-11 LAB — CHROMOGRANIN A: Chromogranin A (ng/mL): 113.9 ng/mL — ABNORMAL HIGH (ref 0.0–101.8)

## 2024-02-08 ENCOUNTER — Inpatient Hospital Stay: Attending: Internal Medicine

## 2024-02-08 VITALS — BP 125/66 | HR 65 | Temp 97.6°F | Resp 16

## 2024-02-08 DIAGNOSIS — C7A019 Malignant carcinoid tumor of the small intestine, unspecified portion: Secondary | ICD-10-CM | POA: Insufficient documentation

## 2024-02-08 DIAGNOSIS — C7B02 Secondary carcinoid tumors of liver: Secondary | ICD-10-CM | POA: Insufficient documentation

## 2024-02-08 DIAGNOSIS — Q2733 Arteriovenous malformation of digestive system vessel: Secondary | ICD-10-CM | POA: Diagnosis not present

## 2024-02-08 DIAGNOSIS — E538 Deficiency of other specified B group vitamins: Secondary | ICD-10-CM | POA: Insufficient documentation

## 2024-02-08 DIAGNOSIS — D500 Iron deficiency anemia secondary to blood loss (chronic): Secondary | ICD-10-CM | POA: Diagnosis not present

## 2024-02-08 DIAGNOSIS — E3400 Carcinoid syndrome, unspecified: Secondary | ICD-10-CM | POA: Diagnosis not present

## 2024-02-08 DIAGNOSIS — K6389 Other specified diseases of intestine: Secondary | ICD-10-CM

## 2024-02-08 DIAGNOSIS — C7A8 Other malignant neuroendocrine tumors: Secondary | ICD-10-CM

## 2024-02-08 MED ORDER — OCTREOTIDE ACETATE 30 MG IM KIT
30.0000 mg | PACK | Freq: Once | INTRAMUSCULAR | Status: AC
  Administered 2024-02-08: 30 mg via INTRAMUSCULAR
  Filled 2024-02-08: qty 1

## 2024-02-08 MED ORDER — CYANOCOBALAMIN 1000 MCG/ML IJ SOLN
1000.0000 ug | Freq: Once | INTRAMUSCULAR | Status: AC
  Administered 2024-02-08: 1000 ug via INTRAMUSCULAR
  Filled 2024-02-08: qty 1

## 2024-02-08 MED ORDER — IRON SUCROSE 20 MG/ML IV SOLN
200.0000 mg | Freq: Once | INTRAVENOUS | Status: AC
  Administered 2024-02-08: 200 mg via INTRAVENOUS
  Filled 2024-02-08: qty 10

## 2024-02-08 NOTE — Patient Instructions (Signed)
 Octreotide Injection Solution What is this medication? OCTREOTIDE (ok TREE oh tide) treats high levels of growth hormone (acromegaly). It works by reducing the amount of growth hormone your body makes. This reduces symptoms and the risk of health problems caused by too much growth hormone, such as diabetes and heart disease. It may also be used to treat diarrhea caused by neuroendocrine tumors. It works by slowing down the release of serotonin from the tumor cells. This reduces the number of bowel movements you have. This medicine may be used for other purposes; ask your health care provider or pharmacist if you have questions. COMMON BRAND NAME(S): Berline Lopes, Sandostatin What should I tell my care team before I take this medication? They need to know if you have any of these conditions: Diabetes Gallbladder disease Heart disease Kidney disease Liver disease Pancreatic disease Thyroid disease An unusual or allergic reaction to octreotide, other medications, foods, dyes, or preservatives Pregnant or trying to get pregnant Breastfeeding How should I use this medication? This medication is injected under the skin or into a vein. It is usually given by your care team in a hospital or clinic setting. If you get this medication at home, you will be taught how to prepare and give it. Use exactly as directed. Take it as directed on the prescription label at the same time every day. Keep taking it unless your care team tells you to stop. Allow the injection solution to come to room temperature before use. Do not warm it artificially. It is important that you put your used needles and syringes in a special sharps container. Do not put them in a trash can. If you do not have a sharps container, call your pharmacist or care team to get one. Talk to your care team about the use of this medication in children. Special care may be needed. Overdosage: If you think you have taken too much of this medicine  contact a poison control center or emergency room at once. NOTE: This medicine is only for you. Do not share this medicine with others. What if I miss a dose? If you miss a dose, take it as soon as you can. If it is almost time for your next dose, take only that dose. Do not take double or extra doses. What may interact with this medication? Bromocriptine Certain medications for blood pressure, heart disease, irregular heartbeat Cyclosporine Diuretics Medications for diabetes, including insulin Quinidine This list may not describe all possible interactions. Give your health care provider a list of all the medicines, herbs, non-prescription drugs, or dietary supplements you use. Also tell them if you smoke, drink alcohol, or use illegal drugs. Some items may interact with your medicine. What should I watch for while using this medication? Visit your care team for regular checks on your progress. Tell your care team if your symptoms do not start to get better or if they get worse. To help reduce irritation at the injection site, use a different site for each injection and make sure the solution is at room temperature before use. This medication may cause decreases in blood sugar. Signs of low blood sugar include chills, cool, pale skin or cold sweats, drowsiness, extreme hunger, fast heartbeat, headache, nausea, nervousness or anxiety, shakiness, trembling, unsteadiness, tiredness, or weakness. Contact your care team right away if you experience any of these symptoms. This medication may increase blood sugar. The risk may be higher in patients who already have diabetes. Ask your care team what you can  do to lower your risk of diabetes while taking this medication. You should make sure you get enough vitamin B12 while you are taking this medication. Discuss the foods you eat and the vitamins you take with your care team. What side effects may I notice from receiving this medication? Side effects that  you should report to your care team as soon as possible: Allergic reactions--skin rash, itching, hives, swelling of the face, lips, tongue, or throat Gallbladder problems--severe stomach pain, nausea, vomiting, fever Heart rhythm changes--fast or irregular heartbeat, dizziness, feeling faint or lightheaded, chest pain, trouble breathing High blood sugar (hyperglycemia)--increased thirst or amount of urine, unusual weakness or fatigue, blurry vision Low blood sugar (hypoglycemia)--tremors or shaking, anxiety, sweating, cold or clammy skin, confusion, dizziness, rapid heartbeat Low thyroid levels (hypothyroidism)--unusual weakness or fatigue, increased sensitivity to cold, constipation, hair loss, dry skin, weight gain, feelings of depression Low vitamin B12 level--pain, tingling, or numbness in the hands or feet, muscle weakness, dizziness, confusion, trouble concentrating Oily or light-colored stools, diarrhea, bloating, weight loss Pancreatitis--severe stomach pain that spreads to your back or gets worse after eating or when touched, fever, nausea, vomiting Slow heartbeat--dizziness, feeling faint or lightheaded, confusion, trouble breathing, unusual weakness or fatigue Side effects that usually do not require medical attention (report these to your care team if they continue or are bothersome): Diarrhea Dizziness Headache Nausea Pain, redness, or irritation at injection site Stomach pain This list may not describe all possible side effects. Call your doctor for medical advice about side effects. You may report side effects to FDA at 1-800-FDA-1088. Where should I keep my medication? Keep out of the reach of children and pets. Store in the refrigerator. Protect from light. Allow to come to room temperature naturally. Do not use artificial heat. If protected from light, the injection may be stored between 20 and 30 degrees C (70 and 86 degrees F) for 14 days. After the initial use, throw away  any unused portion of a multiple dose vial after 14 days. Get rid of any unused portions of the ampules after use. To get rid of medications that are no longer needed or have expired: Take the medication to a medication take-back program. Ask your pharmacy or law enforcement to find a location. If you cannot return the medication, ask your pharmacist or care team how to get rid of the medication safely. NOTE: This sheet is a summary. It may not cover all possible information. If you have questions about this medicine, talk to your doctor, pharmacist, or health care provider.  2024 [employer] (2022-12-26 00:00:00)

## 2024-02-24 ENCOUNTER — Encounter (INDEPENDENT_AMBULATORY_CARE_PROVIDER_SITE_OTHER): Payer: Self-pay | Admitting: Nurse Practitioner

## 2024-03-14 ENCOUNTER — Inpatient Hospital Stay

## 2024-03-14 ENCOUNTER — Inpatient Hospital Stay: Admitting: Internal Medicine

## 2024-06-22 ENCOUNTER — Other Ambulatory Visit
